# Patient Record
Sex: Female | Born: 1975 | Race: White | Hispanic: No | Smoking: Former smoker
Health system: Southern US, Community
[De-identification: ages and names within clinical notes are randomized; demographics above are authoritative.]

## PROBLEM LIST (undated history)

## (undated) DIAGNOSIS — K219 Gastro-esophageal reflux disease without esophagitis: Secondary | ICD-10-CM

## (undated) DIAGNOSIS — G35 Multiple sclerosis: Secondary | ICD-10-CM

## (undated) DIAGNOSIS — G468 Other vascular syndromes of brain in cerebrovascular diseases: Secondary | ICD-10-CM

## (undated) DIAGNOSIS — F329 Major depressive disorder, single episode, unspecified: Secondary | ICD-10-CM

## (undated) DIAGNOSIS — K859 Acute pancreatitis without necrosis or infection, unspecified: Secondary | ICD-10-CM

## (undated) DIAGNOSIS — E2740 Unspecified adrenocortical insufficiency: Secondary | ICD-10-CM

## (undated) DIAGNOSIS — K469 Unspecified abdominal hernia without obstruction or gangrene: Secondary | ICD-10-CM

## (undated) DIAGNOSIS — K862 Cyst of pancreas: Secondary | ICD-10-CM

## (undated) DIAGNOSIS — M329 Systemic lupus erythematosus, unspecified: Secondary | ICD-10-CM

## (undated) DIAGNOSIS — K439 Ventral hernia without obstruction or gangrene: Secondary | ICD-10-CM

## (undated) DIAGNOSIS — IMO0002 Reserved for concepts with insufficient information to code with codable children: Secondary | ICD-10-CM

## (undated) DIAGNOSIS — F32A Depression, unspecified: Secondary | ICD-10-CM

## (undated) DIAGNOSIS — F4310 Post-traumatic stress disorder, unspecified: Secondary | ICD-10-CM

## (undated) DIAGNOSIS — F419 Anxiety disorder, unspecified: Secondary | ICD-10-CM

## (undated) HISTORY — DX: Other vascular syndromes of brain in cerebrovascular diseases: G46.8

## (undated) HISTORY — DX: Systemic lupus erythematosus, unspecified: M32.9

## (undated) HISTORY — PX: ROUX-EN-Y GASTRIC BYPASS: SHX1104

## (undated) HISTORY — PX: ADENOIDECTOMY: SUR15

## (undated) HISTORY — DX: Ventral hernia without obstruction or gangrene: K43.9

## (undated) HISTORY — DX: Post-traumatic stress disorder, unspecified: F43.10

## (undated) HISTORY — PX: ABDOMINAL SURGERY: SHX537

## (undated) HISTORY — PX: CHOLECYSTECTOMY: SHX55

## (undated) HISTORY — PX: ROUX-EN-Y PROCEDURE: SUR1287

## (undated) HISTORY — DX: Reserved for concepts with insufficient information to code with codable children: IMO0002

---

## 2001-02-13 ENCOUNTER — Emergency Department (HOSPITAL_COMMUNITY): Admission: EM | Admit: 2001-02-13 | Discharge: 2001-02-13 | Payer: Self-pay | Admitting: Emergency Medicine

## 2001-02-17 ENCOUNTER — Encounter: Admission: RE | Admit: 2001-02-17 | Discharge: 2001-02-17 | Payer: Self-pay | Admitting: Internal Medicine

## 2001-02-22 ENCOUNTER — Encounter: Payer: Self-pay | Admitting: Internal Medicine

## 2001-02-22 ENCOUNTER — Inpatient Hospital Stay (HOSPITAL_COMMUNITY): Admission: EM | Admit: 2001-02-22 | Discharge: 2001-02-24 | Payer: Self-pay | Admitting: Emergency Medicine

## 2001-02-23 ENCOUNTER — Encounter: Payer: Self-pay | Admitting: Internal Medicine

## 2013-03-15 ENCOUNTER — Emergency Department (HOSPITAL_BASED_OUTPATIENT_CLINIC_OR_DEPARTMENT_OTHER): Payer: Self-pay

## 2013-03-15 ENCOUNTER — Encounter (HOSPITAL_BASED_OUTPATIENT_CLINIC_OR_DEPARTMENT_OTHER): Payer: Self-pay | Admitting: Emergency Medicine

## 2013-03-15 ENCOUNTER — Inpatient Hospital Stay (HOSPITAL_BASED_OUTPATIENT_CLINIC_OR_DEPARTMENT_OTHER)
Admission: EM | Admit: 2013-03-15 | Discharge: 2013-03-23 | DRG: 385 | Disposition: A | Discharge status: Home / Self Care | Payer: Self-pay | Attending: Internal Medicine | Admitting: Internal Medicine

## 2013-03-15 DIAGNOSIS — K859 Acute pancreatitis without necrosis or infection, unspecified: Secondary | ICD-10-CM | POA: Insufficient documentation

## 2013-03-15 DIAGNOSIS — R933 Abnormal findings on diagnostic imaging of other parts of digestive tract: Secondary | ICD-10-CM

## 2013-03-15 DIAGNOSIS — A09 Infectious gastroenteritis and colitis, unspecified: Secondary | ICD-10-CM

## 2013-03-15 DIAGNOSIS — K863 Pseudocyst of pancreas: Secondary | ICD-10-CM | POA: Insufficient documentation

## 2013-03-15 DIAGNOSIS — E876 Hypokalemia: Secondary | ICD-10-CM | POA: Diagnosis present

## 2013-03-15 DIAGNOSIS — K862 Cyst of pancreas: Secondary | ICD-10-CM | POA: Insufficient documentation

## 2013-03-15 DIAGNOSIS — F17200 Nicotine dependence, unspecified, uncomplicated: Secondary | ICD-10-CM | POA: Diagnosis present

## 2013-03-15 DIAGNOSIS — E871 Hypo-osmolality and hyponatremia: Secondary | ICD-10-CM | POA: Diagnosis present

## 2013-03-15 DIAGNOSIS — Z6841 Body Mass Index (BMI) 40.0 and over, adult: Secondary | ICD-10-CM

## 2013-03-15 DIAGNOSIS — K861 Other chronic pancreatitis: Secondary | ICD-10-CM | POA: Diagnosis present

## 2013-03-15 DIAGNOSIS — K8689 Other specified diseases of pancreas: Secondary | ICD-10-CM

## 2013-03-15 DIAGNOSIS — K529 Noninfective gastroenteritis and colitis, unspecified: Secondary | ICD-10-CM

## 2013-03-15 DIAGNOSIS — K5000 Crohn's disease of small intestine without complications: Principal | ICD-10-CM | POA: Diagnosis present

## 2013-03-15 DIAGNOSIS — Z9089 Acquired absence of other organs: Secondary | ICD-10-CM

## 2013-03-15 DIAGNOSIS — K5289 Other specified noninfective gastroenteritis and colitis: Secondary | ICD-10-CM

## 2013-03-15 DIAGNOSIS — K219 Gastro-esophageal reflux disease without esophagitis: Secondary | ICD-10-CM | POA: Diagnosis present

## 2013-03-15 DIAGNOSIS — Z880 Allergy status to penicillin: Secondary | ICD-10-CM

## 2013-03-15 DIAGNOSIS — E668 Other obesity: Secondary | ICD-10-CM | POA: Insufficient documentation

## 2013-03-15 DIAGNOSIS — F1120 Opioid dependence, uncomplicated: Secondary | ICD-10-CM | POA: Diagnosis present

## 2013-03-15 DIAGNOSIS — N390 Urinary tract infection, site not specified: Secondary | ICD-10-CM

## 2013-03-15 HISTORY — DX: Gastro-esophageal reflux disease without esophagitis: K21.9

## 2013-03-15 HISTORY — DX: Unspecified abdominal hernia without obstruction or gangrene: K46.9

## 2013-03-15 HISTORY — DX: Cyst of pancreas: K86.2

## 2013-03-15 HISTORY — DX: Acute pancreatitis without necrosis or infection, unspecified: K85.90

## 2013-03-15 LAB — CBC WITH DIFFERENTIAL/PLATELET
Basophils Absolute: 0 10*3/uL (ref 0.0–0.1)
Basophils Relative: 0 % (ref 0–1)
Eosinophils Absolute: 0 10*3/uL (ref 0.0–0.7)
Eosinophils Relative: 0 % (ref 0–5)
HCT: 37.5 % (ref 36.0–46.0)
Hemoglobin: 12.9 g/dL (ref 12.0–15.0)
Lymphocytes Relative: 14 % (ref 12–46)
Lymphs Abs: 1.9 10*3/uL (ref 0.7–4.0)
MCH: 29.3 pg (ref 26.0–34.0)
MCHC: 34.4 g/dL (ref 30.0–36.0)
MCV: 85 fL (ref 78.0–100.0)
Monocytes Absolute: 1.5 10*3/uL — ABNORMAL HIGH (ref 0.1–1.0)
Monocytes Relative: 11 % (ref 3–12)
Neutro Abs: 10.3 10*3/uL — ABNORMAL HIGH (ref 1.7–7.7)
Neutrophils Relative %: 75 % (ref 43–77)
Platelets: 224 10*3/uL (ref 150–400)
RBC: 4.41 MIL/uL (ref 3.87–5.11)
RDW: 13.4 % (ref 11.5–15.5)
WBC: 13.7 10*3/uL — ABNORMAL HIGH (ref 4.0–10.5)

## 2013-03-15 LAB — COMPREHENSIVE METABOLIC PANEL
CO2: 22 mEq/L (ref 19–32)
Calcium: 8.8 mg/dL (ref 8.4–10.5)
Creatinine, Ser: 0.7 mg/dL (ref 0.50–1.10)
GFR calc Af Amer: >90 mL/min (ref >90)
GFR calc non Af Amer: >90 mL/min (ref >90)
Glucose, Bld: 124 mg/dL — ABNORMAL HIGH (ref 70–99)
Total Protein: 7.8 g/dL (ref 6.0–8.3)

## 2013-03-15 LAB — CBC
Hemoglobin: 11 g/dL — ABNORMAL LOW (ref 12.0–15.0)
MCH: 28.6 pg (ref 26.0–34.0)
Platelets: 225 10*3/uL (ref 150–400)
RBC: 3.84 MIL/uL — ABNORMAL LOW (ref 3.87–5.11)
WBC: 9 10*3/uL (ref 4.0–10.5)

## 2013-03-15 LAB — LIPASE, BLOOD: Lipase: 11 U/L (ref 11–59)

## 2013-03-15 LAB — MAGNESIUM: Magnesium: 2.2 mg/dL (ref 1.5–2.5)

## 2013-03-15 LAB — URINALYSIS, ROUTINE W REFLEX MICROSCOPIC
Glucose, UA: NEGATIVE mg/dL
Ketones, ur: 40 mg/dL — AB
Nitrite: NEGATIVE
Protein, ur: 100 mg/dL — AB
Specific Gravity, Urine: 1.023 (ref 1.005–1.030)
Urobilinogen, UA: 1 mg/dL (ref 0.0–1.0)
pH: 5.5 (ref 5.0–8.0)

## 2013-03-15 LAB — CREATININE, SERUM
Creatinine, Ser: 0.74 mg/dL (ref 0.50–1.10)
GFR calc Af Amer: >90 mL/min (ref >90)

## 2013-03-15 LAB — PREGNANCY, URINE: Preg Test, Ur: NEGATIVE

## 2013-03-15 LAB — URINE MICROSCOPIC-ADD ON

## 2013-03-15 MED ORDER — FENTANYL CITRATE 0.05 MG/ML IJ SOLN
25.0000 ug | Freq: Once | INTRAMUSCULAR | Status: AC
  Administered 2013-03-15: 25 ug via INTRAVENOUS
  Filled 2013-03-15: qty 2

## 2013-03-15 MED ORDER — KETOROLAC TROMETHAMINE 30 MG/ML IJ SOLN
30.0000 mg | Freq: Once | INTRAMUSCULAR | Status: AC
  Administered 2013-03-15: 30 mg via INTRAVENOUS
  Filled 2013-03-15: qty 1

## 2013-03-15 MED ORDER — CIPROFLOXACIN IN D5W 400 MG/200ML IV SOLN
400.0000 mg | Freq: Once | INTRAVENOUS | Status: AC
  Administered 2013-03-15: 400 mg via INTRAVENOUS
  Filled 2013-03-15: qty 200

## 2013-03-15 MED ORDER — METRONIDAZOLE IN NACL 5-0.79 MG/ML-% IV SOLN
500.0000 mg | Freq: Three times a day (TID) | INTRAVENOUS | Status: DC
  Administered 2013-03-15 – 2013-03-21 (×18): 500 mg via INTRAVENOUS
  Filled 2013-03-15 (×22): qty 100

## 2013-03-15 MED ORDER — PANTOPRAZOLE SODIUM 40 MG PO TBEC: 40.0000 mg | DELAYED_RELEASE_TABLET | Freq: Every day | ORAL | Status: DC

## 2013-03-15 MED ORDER — FENTANYL CITRATE 0.05 MG/ML IJ SOLN
50.0000 ug | Freq: Once | INTRAMUSCULAR | Status: AC
Start: 2013-03-15 — End: 2013-03-15
  Administered 2013-03-15: 08:00:00 via INTRAVENOUS
  Filled 2013-03-15: qty 2

## 2013-03-15 MED ORDER — POTASSIUM CHLORIDE 10 MEQ/100ML IV SOLN
10.0000 meq | INTRAVENOUS | Status: AC
  Administered 2013-03-15 (×3): 10 meq via INTRAVENOUS
  Filled 2013-03-15 (×3): qty 100

## 2013-03-15 MED ORDER — POTASSIUM CHLORIDE 10 MEQ/100ML IV SOLN
10.0000 meq | Freq: Once | INTRAVENOUS | Status: AC
  Administered 2013-03-15: 10 meq via INTRAVENOUS
  Filled 2013-03-15: qty 100

## 2013-03-15 MED ORDER — ACETAMINOPHEN 325 MG PO TABS: 650.0000 mg | ORAL_TABLET | Freq: Four times a day (QID) | ORAL | Status: DC | PRN

## 2013-03-15 MED ORDER — HEPARIN SODIUM (PORCINE) 5000 UNIT/ML IJ SOLN
5000.0000 [IU] | Freq: Three times a day (TID) | INTRAMUSCULAR | Status: DC
  Filled 2013-03-15 (×27): qty 1

## 2013-03-15 MED ORDER — DIPHENHYDRAMINE HCL 25 MG PO CAPS
50.0000 mg | ORAL_CAPSULE | Freq: Four times a day (QID) | ORAL | Status: DC | PRN
  Filled 2013-03-15: qty 2

## 2013-03-15 MED ORDER — FENTANYL CITRATE 0.05 MG/ML IJ SOLN: 50.0000 ug | Freq: Once | INTRAMUSCULAR | Status: DC

## 2013-03-15 MED ORDER — SODIUM CHLORIDE 0.9 % IV BOLUS (SEPSIS)
1000.0000 mL | Freq: Once | INTRAVENOUS | Status: AC
  Administered 2013-03-15: 1000 mL via INTRAVENOUS

## 2013-03-15 MED ORDER — ONDANSETRON HCL 4 MG PO TABS: 4.0000 mg | ORAL_TABLET | Freq: Four times a day (QID) | ORAL | Status: DC | PRN

## 2013-03-15 MED ORDER — FENTANYL CITRATE 0.05 MG/ML IJ SOLN
50.0000 ug | Freq: Once | INTRAMUSCULAR | Status: AC
  Administered 2013-03-15: 50 ug via INTRAVENOUS
  Filled 2013-03-15: qty 2

## 2013-03-15 MED ORDER — IOHEXOL 300 MG/ML  SOLN
100.0000 mL | Freq: Once | INTRAMUSCULAR | Status: AC | PRN
  Administered 2013-03-15: 100 mL via INTRAVENOUS

## 2013-03-15 MED ORDER — DIPHENHYDRAMINE HCL 50 MG/ML IJ SOLN
12.5000 mg | Freq: Once | INTRAMUSCULAR | Status: AC
  Administered 2013-03-15: 12.5 mg via INTRAVENOUS
  Filled 2013-03-15: qty 1

## 2013-03-15 MED ORDER — DIPHENHYDRAMINE HCL 50 MG/ML IJ SOLN
50.0000 mg | Freq: Four times a day (QID) | INTRAMUSCULAR | Status: DC | PRN
  Administered 2013-03-15 – 2013-03-21 (×21): 50 mg via INTRAVENOUS
  Filled 2013-03-15 (×24): qty 1

## 2013-03-15 MED ORDER — ACETAMINOPHEN 325 MG PO TABS: 650.0000 mg | ORAL_TABLET | Freq: Once | ORAL | Status: AC

## 2013-03-15 MED ORDER — ONDANSETRON HCL 4 MG/2ML IJ SOLN
4.0000 mg | Freq: Once | INTRAMUSCULAR | Status: AC
  Administered 2013-03-15: 4 mg via INTRAVENOUS
  Filled 2013-03-15: qty 2

## 2013-03-15 MED ORDER — FENTANYL CITRATE 0.05 MG/ML IJ SOLN
50.0000 ug | Freq: Once | INTRAMUSCULAR | Status: AC
  Administered 2013-03-15: 04:00:00 via INTRAVENOUS
  Filled 2013-03-15: qty 2

## 2013-03-15 MED ORDER — HYDROMORPHONE HCL PF 1 MG/ML IJ SOLN
1.0000 mg | INTRAMUSCULAR | Status: DC | PRN
  Administered 2013-03-15 – 2013-03-17 (×17): 1 mg via INTRAVENOUS
  Filled 2013-03-15 (×18): qty 1

## 2013-03-15 MED ORDER — DIPHENHYDRAMINE HCL 50 MG/ML IJ SOLN: 12.5000 mg | Freq: Once | INTRAMUSCULAR | Status: DC

## 2013-03-15 MED ORDER — POTASSIUM CHLORIDE IN NACL 40-0.9 MEQ/L-% IV SOLN
INTRAVENOUS | Status: DC
  Administered 2013-03-15 – 2013-03-18 (×8): via INTRAVENOUS
  Filled 2013-03-15 (×12): qty 1000

## 2013-03-15 MED ORDER — CIPROFLOXACIN IN D5W 400 MG/200ML IV SOLN
400.0000 mg | Freq: Two times a day (BID) | INTRAVENOUS | Status: DC
  Administered 2013-03-15 – 2013-03-21 (×12): 400 mg via INTRAVENOUS
  Filled 2013-03-15 (×14): qty 200

## 2013-03-15 MED ORDER — SODIUM CHLORIDE 0.9 % IV BOLUS (SEPSIS)
500.0000 mL | Freq: Once | INTRAVENOUS | Status: AC
  Administered 2013-03-15: 500 mL via INTRAVENOUS

## 2013-03-15 MED ORDER — METRONIDAZOLE IN NACL 5-0.79 MG/ML-% IV SOLN
500.0000 mg | Freq: Once | INTRAVENOUS | Status: AC
  Administered 2013-03-15: 500 mg via INTRAVENOUS
  Filled 2013-03-15: qty 100

## 2013-03-15 MED ORDER — FENTANYL CITRATE 0.05 MG/ML IJ SOLN
150.0000 ug | Freq: Once | INTRAMUSCULAR | Status: AC
  Administered 2013-03-15: 150 ug via INTRAVENOUS
  Filled 2013-03-15: qty 2

## 2013-03-15 MED ORDER — ONDANSETRON HCL 4 MG/2ML IJ SOLN
4.0000 mg | Freq: Four times a day (QID) | INTRAMUSCULAR | Status: DC | PRN
  Administered 2013-03-15 – 2013-03-19 (×6): 4 mg via INTRAVENOUS
  Filled 2013-03-15 (×13): qty 2

## 2013-03-15 MED ORDER — IOHEXOL 300 MG/ML  SOLN
50.0000 mL | Freq: Once | INTRAMUSCULAR | Status: AC | PRN
  Administered 2013-03-15: 50 mL via ORAL

## 2013-03-15 MED ORDER — ACETAMINOPHEN 325 MG PO TABS
ORAL_TABLET | ORAL | Status: AC
  Administered 2013-03-15: 650 mg via ORAL
  Filled 2013-03-15: qty 2

## 2013-03-15 MED ORDER — ACETAMINOPHEN 650 MG RE SUPP: 650.0000 mg | Freq: Four times a day (QID) | RECTAL | Status: DC | PRN

## 2013-03-15 MED ORDER — PANTOPRAZOLE SODIUM 40 MG IV SOLR
40.0000 mg | INTRAVENOUS | Status: DC
  Administered 2013-03-15 – 2013-03-17 (×3): 40 mg via INTRAVENOUS
  Filled 2013-03-15 (×4): qty 40

## 2013-03-15 NOTE — H&P (Signed)
[facility] History and Physical  Caro Brundidge Fangman VHQ:[id number] DOB: Oct 23, 1975 DOA: 03/15/2013  Referring physician: Nicanor Alcon PCP: No primary provider on file.   Chief Complaint: Abdominal pain  HPI: Mary [name] is a 37 y.o. female who presents to the emergency room with severe abdominal pain. She recently moved back to [place] from [place] and began having pain on the plane. She's had vomiting and diarrhea on and off for several days. Subjective fevers and chills. She has a history of recurrent idiopathic pancreatitis and thought this might be a flare. She has a history of a chronic pancreatic cyst and apparently had a procedure done at [facility] "that connects the intestine to the cyst" no records are available. She has had no sick contacts. She is a no suspect foods. Other than [place], in the [employer] area, she has had no travel. In the emergency room, her workup was significant for a CAT scan that showed the known pancreatic cyst, also, terminal ileitis. Patient has no previous history of ileitis or colitis that she knows of. No history of inflammatory bowel disease. She denies alcohol use whatsoever. She takes no chronic pain medications. She has received multiple doses of fentanyl, and still is having quite a bit of pain.  Review of Systems: Reviewed and as above otherwise negative.  Past Medical History  Diagnosis Date  . Hernia    several other unknown abdominal surgeries  Past Surgical History  Procedure Laterality Date  . Abdominal surgery    . Abdominal surgery      reun y surgery  . Cholecystectomy     Social History: She smokes about one cigarette a day. Denies alcohol or drug use. Currently unemployed. Recently broke up with her significant other, which is why she is moving back to [facility]   Allergies  Allergen Reactions  . Penicillins Anaphylaxis  . Latex Itching  . Morphine And Related Nausea And Vomiting   Family history: Mother and  father have diabetes, hypertension hyperlipidemia. On has collagenous colitis  Prior to Admission medications   Not on File   Physical Exam: Filed Vitals:   03/15/13 0350 03/15/13 0637 03/15/13 0802 03/15/13 0932  BP:  90/60 91/62 89/47   Pulse: 124 94 72 83  Temp: 100.7 F (38.2 C)  97.8 F (36.6 C) 97.4 F (36.3 C)  TempSrc: Oral   Oral  Resp: 20  18 18   Height:    5\' 4"  (1.626 m)  Weight:    112 kg (246 lb 14.6 oz)  SpO2: 98% 98% 96% 97%   BP 99/62  Pulse 73  Temp(Src) 97.5 F (36.4 C) (Oral)  Resp 18  Ht 5\' 4"  (1.626 m)  Wt 112 kg (246 lb 14.6 oz)  BMI 42.36 kg/m2  SpO2 98%  LMP 02/13/2013  General Appearance:    Alert, cooperative, no distress, appears stated age. obese  Head:    Normocephalic, without obvious abnormality, atraumatic  Eyes:    PERRL, conjunctiva/corneas clear, EOM's intact, fundi    benign, both eyes     Nose:   Nares normal, septum midline, mucosa normal, no drainage    or sinus tenderness  Throat:   dry mucous membranes   Neck:   Supple, symmetrical, trachea midline, no adenopathy;    thyroid:  no enlargement/tenderness/nodules; no carotid   bruit or JVD  Back:     Symmetric, no curvature, ROM normal, no CVA tenderness  Lungs:     Clear to auscultation bilaterally, respirations unlabored  Chest Wall:    No tenderness or deformity   Heart:    Regular rate and rhythm, S1 and S2 normal, no murmur, rub   or gallop     Abdomen:    obese. Soft. Mild right-sided abdominal tenderness. Normal bowel sounds   Genitalia:   deferred   Rectal:   deferred   Extremities:   Extremities normal, atraumatic, no cyanosis or edema  Pulses:   2+ and symmetric all extremities  Skin:   Skin color, texture, turgor normal, no rashes or lesions.multiple tattoos   Lymph nodes:   Cervical, supraclavicular, and axillary nodes normal  Neurologic:   CNII-XII intact, normal strength, sensation and reflexes    throughout    psychiatric: Normal affect. Calm and  cooperative  Labs on Admission:  Basic Metabolic Panel:  Recent Labs Lab 03/15/13 0410  NA 132*  K 3.1*  CL 95*  CO2 22  GLUCOSE 124*  BUN 9  CREATININE 0.70  CALCIUM 8.8   Liver Function Tests:  Recent Labs Lab 03/15/13 0410  AST 22  ALT 22  ALKPHOS 70  BILITOT 0.4  PROT 7.8  ALBUMIN 3.3*    Recent Labs Lab 03/15/13 0410  LIPASE 11   No results found for this basename: AMMONIA,  in the last 168 hours CBC:  Recent Labs Lab 03/15/13 0410  WBC 13.7*  NEUTROABS 10.3*  HGB 12.9  HCT 37.5  MCV 85.0  PLT 224   Cardiac Enzymes: No results found for this basename: CKTOTAL, CKMB, CKMBINDEX, TROPONINI,  in the last 168 hours  BNP (last 3 results) No results found for this basename: PROBNP,  in the last 8760 hours CBG: No results found for this basename: GLUCAP,  in the last 168 hours  Radiological Exams on Admission: Ct Abdomen Pelvis W Contrast  03/15/2013   *RADIOLOGY REPORT*  Clinical Data: Nausea, diarrhea, and fever.  CT ABDOMEN AND PELVIS WITH CONTRAST  Technique:  Multidetector CT imaging of the abdomen and pelvis was performed following the standard protocol during bolus administration of intravenous contrast.  Contrast:  100 ml Omnipaque-300.  Comparison: No previous imaging available for comparison. Abdominal MRI report 02/23/2001.  Findings:  BODY WALL: There are three upper abdominal midline herniations containing fat and omental vessel.  LOWER CHEST:  Mediastinum:  9 mm right lower.  Esophageal lymph node. Neighboring esophagus is not dilated or thickened.  Lungs/pleura: No consolidation.  ABDOMEN/PELVIS:  Liver: Diffuse low attenuation.  No focal abnormality.  Biliary: Cholecystectomy.  No biliary dilatation.  Pancreas: There is a septated cystic mass arising from the pancreatic body, with three macroscopic cysts.  In aggregate, the lesion measures 4.4 x 3.85 x 3 cm.  No enhancing nodule identified. No evidence of acute or previous pancreatitis. No main  duct dilatation. No pancreatic dilatation.  Spleen: Unremarkable.  Adrenals: 17 mm nodule in the left adrenal body.  Kidneys and ureters: No hydronephrosis or stone.  Bladder: Unremarkable.  Bowel: No obstruction. There is thickening of the terminal ileal wall circumferentially by submucosal edema.  No other segmental of bowel inflammation identified.  There has been previous small bowel surgery with anastomosis in the left abdomen. No evidence of creeping fat or fistulization. Normal appendix.  Retroperitoneum: Ileocolic lymphadenopathy, presumed reactive to the above. Enlargement of left external iliac chain lymph node, 20 x 10 mm on image 82.  In the absence of additional unexplained intra-abdominal adenopathy, this is presumably reactive.  Peritoneum: No free fluid or gas.  Reproductive: Unremarkable.  Vascular: No acute abnormality.  OSSEOUS: No acute abnormalities. No suspicious lytic or blastic lesions.  IMPRESSION:  1.  Terminal ileitis which could be infectious or inflammatory.  No obstruction. 2.  4.3 cm macrocystic pancreatic mass, present since at least 2002. 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 3.  17 mm left adrenal nodule, likely adenoma.  4. Three ventral abdominal wall hernias containing omentum.   Original Report Authenticated By: Tiburcio Pea   Assessment/Plan Active Problems:   Terminal ileitis:  Could be infectious etiology. Need records from [facility]. Also has been hospitalize at [facility] in [place], [place]. Will get records. Empiric cipro and flagyl. Discussed with Craig Guess, GI PA. She recommends getting records, trying empiric abx. GI consult if no improvement to consider colonoscopy to evaluate for IBD. Will order stool cx. Clears. IVF, antiemetics and pain medicine   Obesity, morbid   Pancreatic cyst, chronic   history of recurrent ideopathic pancreatitis Hypokalemia: replete  Code Status:  full Family Communication: father and sister at bedside Disposition Plan: home Time spent: 60 minutes  Christiane Ha [facility] Pager [phone]  If 7PM-7AM, please contact night-coverage [email or web address] Password [email or web address] 03/15/2013, 10:50 AM

## 2013-03-15 NOTE — ED Notes (Signed)
Pt returned from ct, family at bedside, no complaints at this time

## 2013-03-15 NOTE — Progress Notes (Signed)
[facility] transfer PCP: unassigned 36/F, moving to [place] from [place] presented to ER with Abd pain CT with ileitis and 4.3pancreatic mass, low grade temp and mild tachycardia, improved Accepted to Med-Surg, Team 10 at [facility], [place] [phone]

## 2013-03-15 NOTE — ED Provider Notes (Addendum)
Per Dr. Jomarie Longs admit to inpatient do not place in OBS  Piper Hassebrock Smitty Cords, MD 03/15/13 1610  Jasmine Awe, MD 03/15/13 [phone]

## 2013-03-15 NOTE — Progress Notes (Signed)
Mary [name] [id number] Admission Data: 03/15/2013 8:08 PM Attending Provider: Christiane Ha, MD  PCP:No primary provider on file. Consults/ Treatment Team:    Mary [name] is a 37 y.o. female patient admitted from ED awake, alert  & orientated  X 3,  Full Code, VSS - Blood pressure 99/62, pulse 73, temperature 97.5 F (36.4 C), temperature source Oral, resp. rate 18, height 5\' 4"  (1.626 m), weight 112 kg (246 lb 14.6 oz), last menstrual period 02/13/2013, SpO2 98.00%., , no c/o shortness of breath, no c/o chest pain, no distress noted.    IV site WDL:  forearm left, condition patent and no redness with a transparent dsg that's clean dry and intact.  Allergies:   Allergies  Allergen Reactions  . Penicillins Anaphylaxis  . Latex Itching  . Morphine And Related Nausea And Vomiting     Past Medical History  Diagnosis Date  . Hernia   . GERD (gastroesophageal reflux disease)     History:  obtained from the patient. Tobacco/alcohol: denied none  Pt orientation to unit, room and routine. Information packet given to patient/family and safety video watched.  Admission INP armband ID verified with patient/family, and in place. SR up x 2, fall risk assessment complete with Patient and family verbalizing understanding of risks associated with falls. Pt verbalizes an understanding of how to use the call bell and to call for help before getting out of bed.  Skin, clean-dry- intact without evidence of bruising, or skin tears.   No evidence of skin break down noted on exam.     Will cont to monitor and assist as needed.  Cindra Eves, RN 03/15/2013 8:08 PM

## 2013-03-15 NOTE — ED Provider Notes (Signed)
CSN: [id number]     Arrival date & time 03/15/13  0326 History     First MD Initiated Contact with Patient 03/15/13 [phone]     Chief Complaint  Patient presents with  . Abdominal Pain   (Consider location/radiation/quality/duration/timing/severity/associated sxs/prior Treatment) Patient is a 37 y.o. female presenting with abdominal pain. The history is provided by the patient.  Abdominal Pain This is a recurrent problem. The current episode started 6 to 12 hours ago. The problem occurs constantly. The problem has not changed since onset.Associated symptoms include abdominal pain. Pertinent negatives include no chest pain, no headaches and no shortness of breath. Nothing aggravates the symptoms. Nothing relieves the symptoms. She has tried nothing for the symptoms. The treatment provided no relief.  States she has chronic pancreatitis and thinks she is having a flare because she has upper abdominal pain.  No urinary symptoms but has groin pain.  No trauma.  Just got off a pain that came from [place] and came right here.  Reports morphine causes her pressure to bottom out and states last time she was given it "they told her family she wasn't going to make it."  Past Medical History  Diagnosis Date  . Hernia    Past Surgical History  Procedure Laterality Date  . Abdominal surgery    . Abdominal surgery      reun y surgery  . Cholecystectomy     History reviewed. No pertinent family history. History  Substance Use Topics  . Smoking status: Current Every Day Smoker -- 0.50 packs/day    Types: Cigarettes  . Smokeless tobacco: Not on file  . Alcohol Use: No   OB History   Grav Para Term Preterm Abortions TAB SAB Ect Mult Living                 Review of Systems  Constitutional: Negative for fever.  Respiratory: Negative for shortness of breath.   Cardiovascular: Negative for chest pain.  Gastrointestinal: Positive for abdominal pain. Negative for nausea, vomiting and diarrhea.   Neurological: Negative for headaches.  All other systems reviewed and are negative.    Allergies  Latex; Morphine and related; and Penicillins  Home Medications   Current Outpatient Rx  Name  Route  Sig  Dispense  Refill  . ALPRAZolam (XANAX) 1 MG tablet   Oral   Take 1 mg by mouth at bedtime as needed for sleep.          Pulse 124  Temp(Src) 100.7 F (38.2 C) (Oral)  Resp 20  SpO2 98%  LMP 02/13/2013 Physical Exam  Constitutional: She is oriented to person, place, and time. She appears well-developed and well-nourished. No distress.  HENT:  Head: Normocephalic and atraumatic.  Mouth/Throat: Oropharynx is clear and moist.  Eyes: Conjunctivae are normal. Pupils are equal, round, and reactive to light.  Neck: Normal range of motion. Neck supple.  Cardiovascular: Normal rate, regular rhythm and intact distal pulses.   Pulmonary/Chest: Effort normal and breath sounds normal. She has no wheezes. She has no rales.  Abdominal: Soft. Bowel sounds are normal. There is no tenderness. There is no rebound and no guarding.  Musculoskeletal: Normal range of motion.  Neurological: She is alert and oriented to person, place, and time.  Skin: Skin is warm and dry.  Psychiatric: She has a normal mood and affect.    ED Course   Procedures (including critical care time)  Labs Reviewed  URINALYSIS, ROUTINE W REFLEX MICROSCOPIC - Abnormal; Notable for  the following:    Color, Urine AMBER (*)    APPearance CLOUDY (*)    Hgb urine dipstick SMALL (*)    Bilirubin Urine MODERATE (*)    Ketones, ur 40 (*)    Protein, ur 100 (*)    Leukocytes, UA SMALL (*)    All other components within normal limits  CBC WITH DIFFERENTIAL - Abnormal; Notable for the following:    WBC 13.7 (*)    Neutro Abs 10.3 (*)    Monocytes Absolute 1.5 (*)    All other components within normal limits  URINE MICROSCOPIC-ADD ON - Abnormal; Notable for the following:    Squamous Epithelial / LPF MANY (*)     Bacteria, UA MANY (*)    All other components within normal limits  URINE CULTURE  PREGNANCY, URINE  COMPREHENSIVE METABOLIC PANEL  LIPASE, BLOOD   No results found. No diagnosis found. Results for orders placed during the hospital encounter of 03/15/13  URINALYSIS, ROUTINE W REFLEX MICROSCOPIC      Result Value Range   Color, Urine AMBER (*) YELLOW   APPearance CLOUDY (*) CLEAR   Specific Gravity, Urine 1.023  1.005 - 1.030   pH 5.5  5.0 - 8.0   Glucose, UA NEGATIVE  NEGATIVE mg/dL   Hgb urine dipstick SMALL (*) NEGATIVE   Bilirubin Urine MODERATE (*) NEGATIVE   Ketones, ur 40 (*) NEGATIVE mg/dL   Protein, ur 213 (*) NEGATIVE mg/dL   Urobilinogen, UA 1.0  0.0 - 1.0 mg/dL   Nitrite NEGATIVE  NEGATIVE   Leukocytes, UA SMALL (*) NEGATIVE  CBC WITH DIFFERENTIAL      Result Value Range   WBC 13.7 (*) 4.0 - 10.5 K/uL   RBC 4.41  3.87 - 5.11 MIL/uL   Hemoglobin 12.9  12.0 - 15.0 g/dL   HCT 08.6  57.8 - 46.9 %   MCV 85.0  78.0 - 100.0 fL   MCH 29.3  26.0 - 34.0 pg   MCHC 34.4  30.0 - 36.0 g/dL   RDW 62.9  52.8 - 41.3 %   Platelets 224  150 - 400 K/uL   Neutrophils Relative % 75  43 - 77 %   Lymphocytes Relative 14  12 - 46 %   Monocytes Relative 11  3 - 12 %   Eosinophils Relative 0  0 - 5 %   Basophils Relative 0  0 - 1 %   Neutro Abs 10.3 (*) 1.7 - 7.7 K/uL   Lymphs Abs 1.9  0.7 - 4.0 K/uL   Monocytes Absolute 1.5 (*) 0.1 - 1.0 K/uL   Eosinophils Absolute 0.0  0.0 - 0.7 K/uL   Basophils Absolute 0.0  0.0 - 0.1 K/uL   WBC Morphology ATYPICAL LYMPHOCYTES    COMPREHENSIVE METABOLIC PANEL      Result Value Range   Sodium 132 (*) 135 - 145 mEq/L   Potassium 3.1 (*) 3.5 - 5.1 mEq/L   Chloride 95 (*) 96 - 112 mEq/L   CO2 22  19 - 32 mEq/L   Glucose, Bld 124 (*) 70 - 99 mg/dL   BUN 9  6 - 23 mg/dL   Creatinine, Ser 2.44  0.50 - 1.10 mg/dL   Calcium 8.8  8.4 - 01.0 mg/dL   Total Protein 7.8  6.0 - 8.3 g/dL   Albumin 3.3 (*) 3.5 - 5.2 g/dL   AST 22  0 - 37 U/L   ALT 22  0  - 35 U/L  Alkaline Phosphatase 70  39 - 117 U/L   Total Bilirubin 0.4  0.3 - 1.2 mg/dL   GFR calc non Af Amer >90  >90 mL/min   GFR calc Af Amer >90  >90 mL/min  LIPASE, BLOOD      Result Value Range   Lipase 11  11 - 59 U/L  PREGNANCY, URINE      Result Value Range   Preg Test, Ur NEGATIVE  NEGATIVE  URINE MICROSCOPIC-ADD ON      Result Value Range   Squamous Epithelial / LPF MANY (*) RARE   WBC, UA 3-6  <3 WBC/hpf   RBC / HPF 0-2  <3 RBC/hpf   Bacteria, UA MANY (*) RARE   Urine-Other MUCOUS PRESENT     Ct Abdomen Pelvis W Contrast  03/15/2013   *RADIOLOGY REPORT*  Clinical Data: Nausea, diarrhea, and fever.  CT ABDOMEN AND PELVIS WITH CONTRAST  Technique:  Multidetector CT imaging of the abdomen and pelvis was performed following the standard protocol during bolus administration of intravenous contrast.  Contrast:  100 ml Omnipaque-300.  Comparison: No previous imaging available for comparison. Abdominal MRI report 02/23/2001.  Findings:  BODY WALL: There are three upper abdominal midline herniations containing fat and omental vessel.  LOWER CHEST:  Mediastinum:  9 mm right lower.  Esophageal lymph node. Neighboring esophagus is not dilated or thickened.  Lungs/pleura: No consolidation.  ABDOMEN/PELVIS:  Liver: Diffuse low attenuation.  No focal abnormality.  Biliary: Cholecystectomy.  No biliary dilatation.  Pancreas: There is a septated cystic mass arising from the pancreatic body, with three macroscopic cysts.  In aggregate, the lesion measures 4.4 x 3.85 x 3 cm.  No enhancing nodule identified. No evidence of acute or previous pancreatitis. No main duct dilatation. No pancreatic dilatation.  Spleen: Unremarkable.  Adrenals: 17 mm nodule in the left adrenal body.  Kidneys and ureters: No hydronephrosis or stone.  Bladder: Unremarkable.  Bowel: No obstruction. There is thickening of the terminal ileal wall circumferentially by submucosal edema.  No other segmental of bowel inflammation  identified.  There has been previous small bowel surgery with anastomosis in the left abdomen. No evidence of creeping fat or fistulization. Normal appendix.  Retroperitoneum: Ileocolic lymphadenopathy, presumed reactive to the above. Enlargement of left external iliac chain lymph node, 20 x 10 mm on image 82.  In the absence of additional unexplained intra-abdominal adenopathy, this is presumably reactive.  Peritoneum: No free fluid or gas.  Reproductive: Unremarkable.  Vascular: No acute abnormality.  OSSEOUS: No acute abnormalities. No suspicious lytic or blastic lesions.  IMPRESSION:  1.  Terminal ileitis which could be infectious or inflammatory.  No obstruction. 2.  4.3 cm macrocystic pancreatic mass, present since at least 2002. 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 3.  17 mm left adrenal nodule, likely adenoma.  4. Three ventral abdominal wall hernias containing omentum.   Original Report Authenticated By: Tiburcio Pea  Medications  ciprofloxacin (CIPRO) IVPB 400 mg (not administered)  metroNIDAZOLE (FLAGYL) IVPB 500 mg (not administered)  fentaNYL (SUBLIMAZE) injection 50 mcg ( Intravenous Given 03/15/13 0417)  ondansetron (ZOFRAN) injection 4 mg (4 mg Intravenous Given 03/15/13 0420)  sodium chloride 0.9 % bolus 500 mL (0 mLs Intravenous Stopped 03/15/13 0540)  acetaminophen (TYLENOL) tablet 650 mg (650 mg Oral Given 03/15/13 0418)  fentaNYL (SUBLIMAZE) injection 150 mcg (150 mcg Intravenous Given 03/15/13 0504)  ketorolac (TORADOL) 30 MG/ML injection 30 mg (30 mg Intravenous Given 03/15/13  0503)  iohexol (OMNIPAQUE) 300 MG/ML solution 50 mL (50 mLs Oral Contrast Given 03/15/13 0552)  iohexol (OMNIPAQUE) 300 MG/ML solution 100 mL (100 mLs Intravenous Contrast Given 03/15/13 0552)    MDM  Due to the fact that morphine caused such a severe reaction will choose fentanyl as it is blood pressure neutral.  Will obtain records from  [place]  UTI/ first case ileitis.  Patient also has pancreatic mass will need inpatient work up, has no doctor in the area.    Jasmine Awe, MD 03/15/13 [phone]

## 2013-03-15 NOTE — Progress Notes (Signed)
Pt is c/o hand pain in left hand due to IV potassium. 2 bags hung but pt refused third bag of potassium.   Manaal Mandala Ronette Deter

## 2013-03-15 NOTE — ED Notes (Signed)
Pt recently moved here from [place], flew straight from [place] to [place] to [place], drove from [place] to [employer] today, also abdominal pain to center of abdomen

## 2013-03-16 LAB — CBC WITH DIFFERENTIAL/PLATELET
Basophils Absolute: 0 10*3/uL (ref 0.0–0.1)
Eosinophils Relative: 0 % (ref 0–5)
Lymphocytes Relative: 21 % (ref 12–46)
Lymphs Abs: 1.9 10*3/uL (ref 0.7–4.0)
MCV: 85.8 fL (ref 78.0–100.0)
Neutro Abs: 6.3 10*3/uL (ref 1.7–7.7)
Neutrophils Relative %: 68 % (ref 43–77)
Platelets: 224 10*3/uL (ref 150–400)
RBC: 3.67 MIL/uL — ABNORMAL LOW (ref 3.87–5.11)
RDW: 13.9 % (ref 11.5–15.5)
WBC: 9.1 10*3/uL (ref 4.0–10.5)

## 2013-03-16 LAB — BASIC METABOLIC PANEL
CO2: 19 mEq/L (ref 19–32)
Calcium: 7.9 mg/dL — ABNORMAL LOW (ref 8.4–10.5)
Creatinine, Ser: 0.69 mg/dL (ref 0.50–1.10)
GFR calc Af Amer: >90 mL/min (ref >90)
GFR calc non Af Amer: >90 mL/min (ref >90)
Sodium: 130 mEq/L — ABNORMAL LOW (ref 135–145)

## 2013-03-16 LAB — URINE CULTURE
Colony Count: NO GROWTH
Culture: NO GROWTH

## 2013-03-16 NOTE — Care Management Note (Signed)
    Page 1 of 2   03/23/2013     11:08:33 AM   CARE MANAGEMENT NOTE 03/23/2013  Patient:  Mary [name], Mary [name]   Account Number:  [id number]  Date Initiated:  03/16/2013  Documentation initiated by:  Letha Cape  Subjective/Objective Assessment:   dx ileitis  admit- lives with family.  She is from [place].     Action/Plan:   Anticipated DC Date:  03/23/2013   Anticipated DC Plan:  HOME/SELF CARE      DC Planning Services  CM consult  MATCH Program  Medication Assistance  Follow-up appt scheduled      Choice offered to / List presented to:             Status of service:  Completed, signed off Medicare Important Message given?   (If response is "NO", the following Medicare IM given date fields will be blank) Date Medicare IM given:   Date Additional Medicare IM given:    Discharge Disposition:  HOME/SELF CARE  Per UR Regulation:  Reviewed for med. necessity/level of care/duration of stay  If discussed at Long Length of Stay Meetings, dates discussed:   03/22/2013    Comments:  03/23/13 10:59 Letha Cape RN, BSN [phone] patient is for dc today, she has f/u appt with [facility] in which I explained upon apt she will need to pay $135.00 and also patient needs ast with medications, I assited her with the Match Program.  Informed patient that I could not get [facility] or [facility] because she has not been in [facility] for 3 months yet, but I gave her the paperwork to fill out for the orange card so when her 3 month period is up she can go ahead and take care of this. Patient has transportation. Called financial counselor, Gavin Pound,  to see if she could speak to patient about applying for medicaid.  03/22/13 16:22 Letha Cape RN, BSN [phone] patient slow to clinically improvement, on full liq diet, wean off iv pain meds to orals. Plan for dc on 8/6.  03/16/13 15:00 Letha Cape RN, BSN [phone] patient lives alone, pta indep.  Patient has no insurance, she states she will need ast with medications and a PCP.

## 2013-03-16 NOTE — Progress Notes (Signed)
PATIENT DETAILS Name: Mary [name] Age: 37 y.o. Sex: female Date of Birth: Apr 04, 1976 Admit Date: 03/15/2013 Admitting Physician Christiane Ha, MD PCP:No primary provider on file.  Subjective: Admitted with abdominal pain along with nausea vomiting and diarrhea. Prior history of pancreatitis. This morning, claims that her pain is better.  Assessment/Plan: Principal Problem:   Terminal ileitis - Continue with current supportive care and Cipro with Flagyl - Continue clear liquids, which slowly advance as tolerated - Await C. difficile PCR - Await medical records from [place]-if symptoms do not improve may need a formal GI consultation- however claims to have the current abdominal pain like this with associated nausea vomiting and diarrhea over the past 6 years. She claims she gets admitted to the hospital, get supportive care and subsequently gets discharged.  Active Problems: Hyponatremia - Suspect from dehydration-continue with IV fluids and monitor electrolytes  Chronic idiopathic pancreatitis/chronic pancreatic cyst - On CT of the abdomen-cyst on the pancreas unchanged from 2002 -  Await medical records from [place]  Hypokalemia - Repleted, monitor lites    Obesity, morbid - Counseled regarding importance of weight loss  Disposition: Remain inpatient  DVT Prophylaxis: Prophylacticr Heparin  Code Status: Full code   Family Communication None  Procedures:  None  CONSULTS:  None   MEDICATIONS: Scheduled Meds: . ciprofloxacin  400 mg Intravenous Q12H  . heparin  5,000 Units Subcutaneous Q8H  . metronidazole  500 mg Intravenous Q8H  . pantoprazole (PROTONIX) IV  40 mg Intravenous Q24H   Continuous Infusions: . 0.9 % NaCl with KCl 40 mEq / L 125 mL/hr at 03/16/13 0628   PRN Meds:.acetaminophen, acetaminophen, diphenhydrAMINE, HYDROmorphone (DILAUDID) injection, ondansetron (ZOFRAN) IV, ondansetron  Antibiotics: Anti-infectives   Start      Dose/Rate Route Frequency Ordered Stop   03/15/13 1800  ciprofloxacin (CIPRO) IVPB 400 mg     400 mg 200 mL/hr over 60 Minutes Intravenous Every 12 hours 03/15/13 1045     03/15/13 1600  metroNIDAZOLE (FLAGYL) IVPB 500 mg     500 mg 100 mL/hr over 60 Minutes Intravenous Every 8 hours 03/15/13 1045     03/15/13 0630  ciprofloxacin (CIPRO) IVPB 400 mg     400 mg 200 mL/hr over 60 Minutes Intravenous  Once 03/15/13 0630 03/15/13 0753   03/15/13 0630  metroNIDAZOLE (FLAGYL) IVPB 500 mg     500 mg 100 mL/hr over 60 Minutes Intravenous  Once 03/15/13 0630 03/15/13 0854       PHYSICAL EXAM: Vital signs in last 24 hours: Filed Vitals:   03/15/13 2213 03/16/13 0159 03/16/13 0512 03/16/13 1036  BP: 112/68 129/83 111/68 98/62  Pulse: 85 96 95 85  Temp: 99.5 F (37.5 C) 100.4 F (38 C) 99 F (37.2 C) 99.7 F (37.6 C)  TempSrc: Oral Oral Oral Oral  Resp: 18 20 20 18   Height:      Weight:      SpO2: 98% 99% 96% 94%    Weight change:  Filed Weights   03/15/13 0932  Weight: 112 kg (246 lb 14.6 oz)   Body mass index is 42.36 kg/(m^2).   Gen Exam: Awake and alert with clear speech.   Neck: Supple, No JVD.   Chest: B/L Clear.   CVS: S1 S2 Regular, no murmurs.  Abdomen: soft, BS +, tender in the epigastric/ Peri-umbilical area without rebound, non distended. Obese abdomen Extremities: no edema, lower extremities warm to touch. Neurologic: Non Focal.   Skin: No Rash.   Wounds:  N/A.    Intake/Output from previous day:  Intake/Output Summary (Last 24 hours) at 03/16/13 1241 Last data filed at 03/16/13 1041  Gross per 24 hour  Intake 3010.09 ml  Output   1830 ml  Net 1180.09 ml     LAB RESULTS: CBC  Recent Labs Lab 03/15/13 0410 03/15/13 1105 03/16/13 0645  WBC 13.7* 9.0 9.1  HGB 12.9 11.0* 10.6*  HCT 37.5 32.6* 31.5*  PLT 224 225 224  MCV 85.0 84.9 85.8  MCH 29.3 28.6 28.9  MCHC 34.4 33.7 33.7  RDW 13.4 13.5 13.9  LYMPHSABS 1.9  --  1.9  MONOABS 1.5*  --  1.0   EOSABS 0.0  --  0.0  BASOSABS 0.0  --  0.0    Chemistries   Recent Labs Lab 03/15/13 0410 03/15/13 1105 03/16/13 0645  NA 132*  --  130*  K 3.1*  --  4.3  CL 95*  --  101  CO2 22  --  19  GLUCOSE 124*  --  103*  BUN 9  --  6  CREATININE 0.70 0.74 0.69  CALCIUM 8.8  --  7.9*  MG  --  2.2  --     CBG: No results found for this basename: GLUCAP,  in the last 168 hours  GFR Estimated Creatinine Clearance: 120.2 ml/min (by C-G formula based on Cr of 0.69).  Coagulation profile No results found for this basename: INR, PROTIME,  in the last 168 hours  Cardiac Enzymes No results found for this basename: CK, CKMB, TROPONINI, MYOGLOBIN,  in the last 168 hours  No components found with this basename: POCBNP,  No results found for this basename: DDIMER,  in the last 72 hours No results found for this basename: HGBA1C,  in the last 72 hours No results found for this basename: CHOL, HDL, LDLCALC, TRIG, CHOLHDL, LDLDIRECT,  in the last 72 hours No results found for this basename: TSH, T4TOTAL, FREET3, T3FREE, THYROIDAB,  in the last 72 hours No results found for this basename: VITAMINB12, FOLATE, FERRITIN, TIBC, IRON, RETICCTPCT,  in the last 72 hours  Recent Labs  03/15/13 0410  LIPASE 11    Urine Studies No results found for this basename: UACOL, UAPR, USPG, UPH, UTP, UGL, UKET, UBIL, UHGB, UNIT, UROB, ULEU, UEPI, UWBC, URBC, UBAC, CAST, CRYS, UCOM, BILUA,  in the last 72 hours  MICROBIOLOGY: Recent Results (from the past 240 hour(s))  URINE CULTURE     Status: None   Collection Time    03/15/13  3:49 AM      Result Value Range Status   Specimen Description URINE, CLEAN CATCH   Final   Special Requests NONE   Final   Culture  Setup Time 03/15/2013 10:59   Final   Colony Count NO GROWTH   Final   Culture NO GROWTH   Final   Report Status 03/16/2013 FINAL   Final    RADIOLOGY STUDIES/RESULTS: Ct Abdomen Pelvis W Contrast  03/15/2013   *RADIOLOGY REPORT*  Clinical  Data: Nausea, diarrhea, and fever.  CT ABDOMEN AND PELVIS WITH CONTRAST  Technique:  Multidetector CT imaging of the abdomen and pelvis was performed following the standard protocol during bolus administration of intravenous contrast.  Contrast:  100 ml Omnipaque-300.  Comparison: No previous imaging available for comparison. Abdominal MRI report 02/23/2001.  Findings:  BODY WALL: There are three upper abdominal midline herniations containing fat and omental vessel.  LOWER CHEST:  Mediastinum:  9 mm right lower.  Esophageal lymph node. Neighboring esophagus is not dilated or thickened.  Lungs/pleura: No consolidation.  ABDOMEN/PELVIS:  Liver: Diffuse low attenuation.  No focal abnormality.  Biliary: Cholecystectomy.  No biliary dilatation.  Pancreas: There is a septated cystic mass arising from the pancreatic body, with three macroscopic cysts.  In aggregate, the lesion measures 4.4 x 3.85 x 3 cm.  No enhancing nodule identified. No evidence of acute or previous pancreatitis. No main duct dilatation. No pancreatic dilatation.  Spleen: Unremarkable.  Adrenals: 17 mm nodule in the left adrenal body.  Kidneys and ureters: No hydronephrosis or stone.  Bladder: Unremarkable.  Bowel: No obstruction. There is thickening of the terminal ileal wall circumferentially by submucosal edema.  No other segmental of bowel inflammation identified.  There has been previous small bowel surgery with anastomosis in the left abdomen. No evidence of creeping fat or fistulization. Normal appendix.  Retroperitoneum: Ileocolic lymphadenopathy, presumed reactive to the above. Enlargement of left external iliac chain lymph node, 20 x 10 mm on image 82.  In the absence of additional unexplained intra-abdominal adenopathy, this is presumably reactive.  Peritoneum: No free fluid or gas.  Reproductive: Unremarkable.  Vascular: No acute abnormality.  OSSEOUS: No acute abnormalities. No suspicious lytic or blastic lesions.  IMPRESSION:  1.  Terminal  ileitis which could be infectious or inflammatory.  No obstruction. 2.  4.3 cm macrocystic pancreatic mass, present since at least 2002. 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 3.  17 mm left adrenal nodule, likely adenoma.  4. Three ventral abdominal wall hernias containing omentum.   Original Report Authenticated By: Cynda Acres, MD  [facility] Pager:[phone]  If 7PM-7AM, please contact night-coverage [email or web address] Password [email or web address] 03/16/2013, 12:41 PM   LOS: 1 day

## 2013-03-17 ENCOUNTER — Encounter (HOSPITAL_COMMUNITY): Payer: Self-pay | Admitting: Physician Assistant

## 2013-03-17 DIAGNOSIS — A09 Infectious gastroenteritis and colitis, unspecified: Secondary | ICD-10-CM

## 2013-03-17 DIAGNOSIS — E871 Hypo-osmolality and hyponatremia: Secondary | ICD-10-CM

## 2013-03-17 DIAGNOSIS — R933 Abnormal findings on diagnostic imaging of other parts of digestive tract: Secondary | ICD-10-CM

## 2013-03-17 LAB — CBC
HCT: 31.8 % — ABNORMAL LOW (ref 36.0–46.0)
MCV: 86.4 fL (ref 78.0–100.0)
RBC: 3.68 MIL/uL — ABNORMAL LOW (ref 3.87–5.11)
RDW: 14 % (ref 11.5–15.5)
WBC: 7.7 10*3/uL (ref 4.0–10.5)

## 2013-03-17 LAB — BASIC METABOLIC PANEL
BUN: 5 mg/dL — ABNORMAL LOW (ref 6–23)
CO2: 20 mEq/L (ref 19–32)
Chloride: 102 mEq/L (ref 96–112)
GFR calc Af Amer: >90 mL/min (ref >90)
Potassium: 4.8 mEq/L (ref 3.5–5.1)

## 2013-03-17 LAB — GLUCOSE, CAPILLARY: Glucose-Capillary: 79 mg/dL (ref 70–99)

## 2013-03-17 MED ORDER — HYDROMORPHONE HCL PF 1 MG/ML IJ SOLN
1.0000 mg | INTRAMUSCULAR | Status: DC | PRN
  Administered 2013-03-17 – 2013-03-18 (×7): 1 mg via INTRAVENOUS
  Filled 2013-03-17 (×7): qty 1

## 2013-03-17 MED ORDER — NAPHAZOLINE HCL 0.1 % OP SOLN
1.0000 [drp] | Freq: Four times a day (QID) | OPHTHALMIC | Status: DC | PRN
  Administered 2013-03-17: 1 [drp] via OPHTHALMIC
  Filled 2013-03-17: qty 15

## 2013-03-17 NOTE — Consult Note (Signed)
[facility] Consult: 3:09 PM 03/17/2013   Referring Provider: Dr Ghimire Primary Care Physician:  None Primary Gastroenterologist:  none  Reason for Consultation:  Ileitis and pancreatic cystic mass.   HPI: Mary [name] is a 36 y.o. female.  Hx of recurrent pancreatitis and cystic pancreatic mass, ? Mucinous cyst neoplasm.  First episode was in 2002 and CT from then is cc'd below. Subsequently spent next decade and a [place] in [place]. Treated several times for the cyst and episodes of pancreatitis.  Interventions included attempt to place a "stent" to drain, this became infected within 24 hours and was then removed.  Soon after had a surgery where they hooked up her SB to the cyst. This all was in about 2008.  After that had about 2 other admissions with pancreas issues, but no further interventions.  Last was about 09/2011.  [place] onset of epigastric and LUQ pain with bilious emesis and watery, non-bloody diarrhea, chills, sweats. Came to ED on 7/29 and never had any BMs after that, so unable to get any stools studies.  Th nausea persists as does the pain.  These are indistinguishable from prior bouts of pancreatitis.   CT shows cystic masses in pancreatic body and terminal ileitis.  Lipase, LFTs normal. Na low to 130.  WBCs were 13.7.  Started on Cipro/Flagyl (now day 3) and WBCs now normal. Urine clx is negative.   Before 7/27 she had no GI issues.  She was eating well, did not have weight loss, no diarrhea, no heartburn, no dysphagia, no jaundice.   Her sxs persist.  Pt never drinks ETOH and never did. Smokes up to 10 cigs per day.  No occasional or regular meds.      Past Medical History  Diagnosis Date  . Hernia   . GERD (gastroesophageal reflux disease)     Past Surgical History  Procedure Laterality Date  . Abdominal surgery    . Abdominal surgery      reun y surgery  .  Cholecystectomy      Prior to Admission medications   Not on File    Scheduled Meds: . ciprofloxacin  400 mg Intravenous Q12H  . heparin  5,000 Units Subcutaneous Q8H  . metronidazole  500 mg Intravenous Q8H  . pantoprazole (PROTONIX) IV  40 mg Intravenous Q24H   Infusions: . 0.9 % NaCl with KCl 40 mEq / L 125 mL/hr at 03/17/13 1049   PRN Meds: acetaminophen, acetaminophen, diphenhydrAMINE, HYDROmorphone (DILAUDID) injection, ondansetron (ZOFRAN) IV, ondansetron   Allergies as of 03/15/2013 - Review Complete 03/15/2013  Allergen Reaction Noted  . Penicillins Anaphylaxis 03/15/2013  . Latex Itching 03/15/2013  . Morphine and related Nausea And Vomiting 03/15/2013     family history. No IBD, no pancreatitis.   History   Social History  . Marital Status: Single    Spouse Name: N/A    Number of Children: N/A  . Years of Education: N/A   Occupational History  . 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 Smoking status: Current Every Day Smoker -- 0.50 packs/day for 20 years    Types: Cigarettes  . Smokeless tobacco: Never Used  . Alcohol Use: No  . Drug Use: No  . Sexually Active: Not on file   Other Topics Concern  . Just broke up with her girlfriend of 14 years   Social   History Narrative  . No narrative on file    REVIEW OF SYSTEMS: Constitutional:  Stable weight ENT:  No nose bleeds,no congestion Pulm:  No SOB, no cough CV:  No CP , no palps, no extremity edema GU:  No dysuria, no hematuria Gyn:  Life long hx irregular menses, last was in April 2014 GI:  Per HPI Heme:  No hx low blood counts.    Transfusions:  none Neuro:  No headache, no hx seizures. Derm:  No rash, sores itching Tattoos:  All are professional starting age 21 to age 30 Endocrine:  No excessive thirst, no hx thyroid disease.  Never told she had polycystic ovary disease.  Immunization:  Not queried Travel:  Travelled from [place]  area of [place] 7/27   PHYSICAL EXAM: Vital signs in last 24 hours: Temp:  [98.2 F (36.8 C)-99.7 F (37.6 C)] 98.8 F (37.1 C) (07/31 1344) Pulse Rate:  [74-82] 74 (07/31 1344) Resp:  [18] 18 (07/31 1344) BP: (102-126)/(58-74) 108/63 mmHg (07/31 1344) SpO2:  [94 %-98 %] 96 % (07/31 1344)  General: obese wf with lip ring and shaved head.  Head:  No asymmetry, no swelling  Eyes:  No icterus or pallor Ears:  Not HOH  Nose:  No discharge or congestion Mouth:  Lip ring, moist and clear oral MM Neck:  No JVD, no goiter, no mass Lungs:  Clear bil  No SOB or cough Heart: RRR no MRG Abdomen:  Soft, active BS, ND, no mass or HSM.  Do not appreciate ventral hernia.  Tender in upper abdomen.  No guard or rebound. Large transverse surgical incision upper abdomen Rectal: scant stool is FOB negative and brown, mucoid.    Musc/Skeltl: no joint swelling, no joint deformity. Extremities:  No CCE.  Feet warm with good pedal pulses  Neurologic:  No tremor, no  Skin:  No rash or sores Tattoos:  Several on her arms. Nodes:  No cervical adenopathy.    Psych:  Pleasant, cooperative, not somnolent.   Intake/Output from previous day: 07/30 0701 - 07/31 0700 In: 1545.8 [I.V.:1545.8] Out: 1630 [Urine:1630] Intake/Output this shift:    LAB RESULTS:  Recent Labs  03/15/13 1105 03/16/13 0645 03/17/13 0615  WBC 9.0 9.1 7.7  HGB 11.0* 10.6* 10.7*  HCT 32.6* 31.5* 31.8*  PLT 225 224 280   BMET Lab Results  Component Value Date   NA 134* 03/17/2013   NA 130* 03/16/2013   NA 132* 03/15/2013   K 4.8 03/17/2013   K 4.3 03/16/2013   K 3.1* 03/15/2013   CL 102 03/17/2013   CL 101 03/16/2013   CL 95* 03/15/2013   CO2 20 03/17/2013   CO2 19 03/16/2013   CO2 22 03/15/2013   GLUCOSE 105* 03/17/2013   GLUCOSE 103* 03/16/2013   GLUCOSE 124* 03/15/2013   BUN 5* 03/17/2013   BUN 6 03/16/2013   BUN 9 03/15/2013   CREATININE 0.76 03/17/2013   CREATININE 0.69 03/16/2013   CREATININE 0.74 03/15/2013   CALCIUM  8.7 03/17/2013   CALCIUM 7.9* 03/16/2013   CALCIUM 8.8 03/15/2013   LFT  Recent Labs  03/15/13 0410  PROT 7.8  ALBUMIN 3.3*  AST 22  ALT 22  ALKPHOS 70  BILITOT 0.4    RADIOLOGY STUDIES: CT ABDOMEN AND PELVIS WITH CONTRAST 03/15/2013 Findings:  BODY WALL: There are three upper abdominal midline herniations  containing fat and omental vessel.  LOWER CHEST:  Mediastinum: 9 mm right lower. Esophageal lymph node.  Neighboring esophagus   is not dilated or thickened.  Lungs/pleura: No consolidation.  ABDOMEN/PELVIS:  Liver: Diffuse low attenuation. No focal abnormality.  Biliary: Cholecystectomy. No biliary dilatation.  Pancreas: There is a septated cystic mass arising from the  pancreatic body, with three macroscopic cysts. In aggregate, the  lesion measures 4.4 x 3.85 x 3 cm. No enhancing nodule identified.  No evidence of acute or previous pancreatitis. No main duct  dilatation. No pancreatic dilatation.  Spleen: Unremarkable.  Adrenals: 17 mm nodule in the left adrenal body.  Kidneys and ureters: No hydronephrosis or stone.  Bladder: Unremarkable.  Bowel: No obstruction. There is thickening of the terminal ileal  wall circumferentially by submucosal edema. No other segmental of  bowel inflammation identified. There has been previous small bowel  surgery with anastomosis in the left abdomen. No evidence of  creeping fat or fistulization. Normal appendix.  Retroperitoneum: Ileocolic lymphadenopathy, presumed reactive to  the above. Enlargement of left external iliac chain lymph node, 20  x 10 mm on image 82. In the absence of additional unexplained  intra-abdominal adenopathy, this is presumably reactive.  Peritoneum: No free fluid or gas.  Reproductive: Unremarkable.  Vascular: No acute abnormality.  OSSEOUS: No acute abnormalities. No suspicious lytic or blastic  lesions.  IMPRESSION:  1. Terminal ileitis which could be infectious or inflammatory. No  obstruction.  2.  4.3 cm macrocystic pancreatic mass, present since at least  2002. Favor low grade neoplasm (such as mucinous cystic pancreatic  tumor); surveillance imaging recommended.  3. 17 mm left adrenal nodule, likely adenoma.  4. Three ventral abdominal wall hernias containing omentum.  Original Report Authenticated By: Jonathan Watts  CT ABDOMEN AND PELVIS WITH CONTRAST 02/22/2001 FINDINGS THE LUNG BASES ARE CLEAR AND THERE IS NO PLEURAL EFFUSION. THE GALLBLADDER IS SURGICALLY ABSENT.  THERE IS NO BILIARY DILATATION. THE LIVER, SPLEEN, ADRENAL GLANDS, AND KIDNEYS ARE UNREMARKABLE.  PROXIMALLY WITHIN THE PANCREATIC TAIL, THERE IS A 3.7 X 2.5 CM OVAL COLLECTION THAT MEASURES JUST ABOVE WATER ATTENUATION. THE PANCREATIC TAIL IS PROMINENT. I DO NOT SEE ANY DILATATION OF THE PANCREATIC DUCT. THERE IS SOME SOFT TISSUE STRANDING IN THE FAT SURROUNDING THE PANCREATIC TAIL. NO OTHER INTRA-ABDOMINAL FLUID COLLECTIONS ARE PRESENT.  IMPRESSION  OVAL MASS WITHIN THE PANCREATIC TAIL MEASURING NEAR WATER ATTENUATION. 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 QUESTION PANCREATITIS OF THE PANCREATIC TAIL. CORRELATION WITH THE PATIENT'S PRIOR IMAGING STUDIES IS STRONGLY RECOMMENDED. IF THOSE PRIOR STUDIES CANNOT BE OBTAINED, IT MAY BE HELPFUL TO PERFORM A FOLLOW-UP MRI TO INCLUDE POST-CONTRAST IMAGES.   CT SCAN OF THE PELVIS:  THERE IS NO PELVIC FLUID COLLECTION, MASS, OR INFLAMMATORY PROCESS. THE OVARIES ARE SLIGHTLY  PROMINENT, BUT WITHIN NORMAL LIMITS FOR SIZE.  IMPRESSION:  NEGATIVE CT OF THE PELVIS.   ENDOSCOPIC STUDIES: Not known.  Nothing in Epic.  All scopes were in [place] at [facility]  IMPRESSION: *  Ileitis.  Ongoing abdominal pain and nausea but no diarrhea since admission.  sxs started 7/27.  ? Infectious vs Crohn's? *  Pancreatic cystic mass.  Hx of same and of idiopathic, recurrent pancreatitis dating  back to 2002 S/p some sort of cyst drainage involving the small intestine. This after complications from stent placement.  *  Ventral wall hernias *  Adrenal nodule.   PLAN: *  Per Dr Ina Scrivens.  *  ? Check tryglycerides to see if that may be source of previous bouts with pancreatitis.  *    MD has requested [facility] records, they told staff it takes up to 15 days to fax records!!!!!!  Dr Ghimire is going to insist that records be expidited.    LOS: 2 days   Sarah Gribbin  03/17/2013, 3:09 PM Pager: [phone]  GI ATTENDING  Patient personally seen and examined. History, laboratories, and x-rays reviewed. Patient's parents in room. Agree with history and physical as outlined above. Patient presents with nausea vomiting abdominal pain and diarrhea. A letter has resolved. She has recurrent pancreatitis and cystic pancreas issues by history. A large portion of her with medical care including surgeries have been performed in [place]. We're hopeful to get records.. I suspect she may have had an acute gastroenteritis to explain her nausea vomiting diarrhea and mild ileitis on CT. However, her current pain is quite typical of pancreatic origin maybe secondary to the cyst. At this point, a supportive care with hydration, pain control, and advance the diet as tolerated. In addition to review of outside records, she will likely need an endoscopic ultrasound with cyst aspiration. I have discussed this with the patient and her family. Also, modified her pain medication a bit. We'll follow  Guilherme Schwenke N. Natayla Cadenhead, Jr., M.D. [facility]    

## 2013-03-17 NOTE — Progress Notes (Signed)
PATIENT DETAILS Name: Mary [name] Age: 37 y.o. Sex: female Date of Birth: 23-May-1976 Admit Date: 03/15/2013 Admitting Physician Christiane Ha, MD PCP:No primary provider on file.  Subjective: Still complaining of abd pain-not able to tolerate liquids. No diarrhea.  Assessment/Plan: Principal Problem:   Terminal ileitis - Continue with current supportive care and Cipro with Flagyl -Not able to tolerate clear liquids, will make her NPO -since no improvement-have asked GI to consult - Await medical records from [place]-requested yesterday from [facility], have asked the secretary to see if we can call them and get it asap.  Active Problems: Hyponatremia - Suspect from dehydration-continue with IV fluids and monitor electrolytes -Na levels better today  Chronic idiopathic pancreatitis/chronic pancreatic cyst - On CT of the abdomen-cyst on the pancreas unchanged from 2002 -  Await medical records from [place]  Hypokalemia - Repleted, monitor lites    Obesity, morbid - Counseled regarding importance of weight loss  Disposition: Remain inpatient  DVT Prophylaxis: Prophylacticr Heparin  Code Status: Full code   Family Communication None  Procedures:  None  CONSULTS:  None   MEDICATIONS: Scheduled Meds: . ciprofloxacin  400 mg Intravenous Q12H  . heparin  5,000 Units Subcutaneous Q8H  . metronidazole  500 mg Intravenous Q8H  . pantoprazole (PROTONIX) IV  40 mg Intravenous Q24H   Continuous Infusions: . 0.9 % NaCl with KCl 40 mEq / L 125 mL/hr at 03/17/13 1049   PRN Meds:.acetaminophen, acetaminophen, diphenhydrAMINE, HYDROmorphone (DILAUDID) injection, ondansetron (ZOFRAN) IV, ondansetron  Antibiotics: Anti-infectives   Start     Dose/Rate Route Frequency Ordered Stop   03/15/13 1800  ciprofloxacin (CIPRO) IVPB 400 mg     400 mg 200 mL/hr over 60 Minutes Intravenous Every 12 hours 03/15/13 1045     03/15/13 1600  metroNIDAZOLE (FLAGYL) IVPB 500  mg     500 mg 100 mL/hr over 60 Minutes Intravenous Every 8 hours 03/15/13 1045     03/15/13 0630  ciprofloxacin (CIPRO) IVPB 400 mg     400 mg 200 mL/hr over 60 Minutes Intravenous  Once 03/15/13 0630 03/15/13 0753   03/15/13 0630  metroNIDAZOLE (FLAGYL) IVPB 500 mg     500 mg 100 mL/hr over 60 Minutes Intravenous  Once 03/15/13 0630 03/15/13 0854       PHYSICAL EXAM: Vital signs in last 24 hours: Filed Vitals:   03/16/13 2138 03/17/13 0032 03/17/13 0504 03/17/13 0921  BP: 102/68 120/74 126/70 103/58  Pulse: 82 82 80 79  Temp: 98.8 F (37.1 C) 99.7 F (37.6 C) 98.8 F (37.1 C) 98.2 F (36.8 C)  TempSrc: Oral Oral Oral Oral  Resp: 18 18 18 18   Height:      Weight:      SpO2: 97% 94% 94% 98%    Weight change:  Filed Weights   03/15/13 0932  Weight: 112 kg (246 lb 14.6 oz)   Body mass index is 42.36 kg/(m^2).   Gen Exam: Awake and alert with clear speech.   Neck: Supple, No JVD.   Chest: B/L Clear.   CVS: S1 S2 Regular, no murmurs.  Abdomen: soft, BS +, tender in the epigastric/ Peri-umbilical area without rebound, non distended. Obese abdomen Extremities: no edema, lower extremities warm to touch. Neurologic: Non Focal.   Skin: No Rash.   Wounds: N/A.    Intake/Output from previous day:  Intake/Output Summary (Last 24 hours) at 03/17/13 1111 Last data filed at 03/17/13 0912  Gross per 24 hour  Intake 1545.83 ml  Output    600 ml  Net 945.83 ml     LAB RESULTS: CBC  Recent Labs Lab 03/15/13 0410 03/15/13 1105 03/16/13 0645  WBC 13.7* 9.0 9.1  HGB 12.9 11.0* 10.6*  HCT 37.5 32.6* 31.5*  PLT 224 225 224  MCV 85.0 84.9 85.8  MCH 29.3 28.6 28.9  MCHC 34.4 33.7 33.7  RDW 13.4 13.5 13.9  LYMPHSABS 1.9  --  1.9  MONOABS 1.5*  --  1.0  EOSABS 0.0  --  0.0  BASOSABS 0.0  --  0.0    Chemistries   Recent Labs Lab 03/15/13 0410 03/15/13 1105 03/16/13 0645 03/17/13 0615  NA 132*  --  130* 134*  K 3.1*  --  4.3 4.8  CL 95*  --  101 102  CO2  22  --  19 20  GLUCOSE 124*  --  103* 105*  BUN 9  --  6 5*  CREATININE 0.70 0.74 0.69 0.76  CALCIUM 8.8  --  7.9* 8.7  MG  --  2.2  --   --     CBG:  Recent Labs Lab 03/17/13 0456  GLUCAP 79    GFR Estimated Creatinine Clearance: 120.2 ml/min (by C-G formula based on Cr of 0.76).  Coagulation profile No results found for this basename: INR, PROTIME,  in the last 168 hours  Cardiac Enzymes No results found for this basename: CK, CKMB, TROPONINI, MYOGLOBIN,  in the last 168 hours  No components found with this basename: POCBNP,  No results found for this basename: DDIMER,  in the last 72 hours No results found for this basename: HGBA1C,  in the last 72 hours No results found for this basename: CHOL, HDL, LDLCALC, TRIG, CHOLHDL, LDLDIRECT,  in the last 72 hours No results found for this basename: TSH, T4TOTAL, FREET3, T3FREE, THYROIDAB,  in the last 72 hours No results found for this basename: VITAMINB12, FOLATE, FERRITIN, TIBC, IRON, RETICCTPCT,  in the last 72 hours  Recent Labs  03/15/13 0410  LIPASE 11    Urine Studies No results found for this basename: UACOL, UAPR, USPG, UPH, UTP, UGL, UKET, UBIL, UHGB, UNIT, UROB, ULEU, UEPI, UWBC, URBC, UBAC, CAST, CRYS, UCOM, BILUA,  in the last 72 hours  MICROBIOLOGY: Recent Results (from the past 240 hour(s))  URINE CULTURE     Status: None   Collection Time    03/15/13  3:49 AM      Result Value Range Status   Specimen Description URINE, CLEAN CATCH   Final   Special Requests NONE   Final   Culture  Setup Time 03/15/2013 10:59   Final   Colony Count NO GROWTH   Final   Culture NO GROWTH   Final   Report Status 03/16/2013 FINAL   Final    RADIOLOGY STUDIES/RESULTS: Ct Abdomen Pelvis W Contrast  03/15/2013   *RADIOLOGY REPORT*  Clinical Data: Nausea, diarrhea, and fever.  CT ABDOMEN AND PELVIS WITH CONTRAST  Technique:  Multidetector CT imaging of the abdomen and pelvis was performed following the standard protocol  during bolus administration of intravenous contrast.  Contrast:  100 ml Omnipaque-300.  Comparison: No previous imaging available for comparison. Abdominal MRI report 02/23/2001.  Findings:  BODY WALL: There are three upper abdominal midline herniations containing fat and omental vessel.  LOWER CHEST:  Mediastinum:  9 mm right lower.  Esophageal lymph node. Neighboring esophagus is not dilated or thickened.  Lungs/pleura: No consolidation.  ABDOMEN/PELVIS:  Liver: Diffuse low  attenuation.  No focal abnormality.  Biliary: Cholecystectomy.  No biliary dilatation.  Pancreas: There is a septated cystic mass arising from the pancreatic body, with three macroscopic cysts.  In aggregate, the lesion measures 4.4 x 3.85 x 3 cm.  No enhancing nodule identified. No evidence of acute or previous pancreatitis. No main duct dilatation. No pancreatic dilatation.  Spleen: Unremarkable.  Adrenals: 17 mm nodule in the left adrenal body.  Kidneys and ureters: No hydronephrosis or stone.  Bladder: Unremarkable.  Bowel: No obstruction. There is thickening of the terminal ileal wall circumferentially by submucosal edema.  No other segmental of bowel inflammation identified.  There has been previous small bowel surgery with anastomosis in the left abdomen. No evidence of creeping fat or fistulization. Normal appendix.  Retroperitoneum: Ileocolic lymphadenopathy, presumed reactive to the above. Enlargement of left external iliac chain lymph node, 20 x 10 mm on image 82.  In the absence of additional unexplained intra-abdominal adenopathy, this is presumably reactive.  Peritoneum: No free fluid or gas.  Reproductive: Unremarkable.  Vascular: No acute abnormality.  OSSEOUS: No acute abnormalities. No suspicious lytic or blastic lesions.  IMPRESSION:  1.  Terminal ileitis which could be infectious or inflammatory.  No obstruction. 2.  4.3 cm macrocystic pancreatic mass, present since at least 2002. Favor low grade neoplasm (such as mucinous  cystic pancreatic tumor); surveillance imaging recommended. 3.  17 mm left adrenal nodule, likely adenoma.  4. Three ventral abdominal wall hernias containing omentum.   Original Report Authenticated By: Cynda Acres, MD  [facility] Pager:[phone]  If 7PM-7AM, please contact night-coverage [email or web address] Password [email or web address] 03/17/2013, 11:11 AM   LOS: 2 days

## 2013-03-18 DIAGNOSIS — K859 Acute pancreatitis without necrosis or infection, unspecified: Secondary | ICD-10-CM

## 2013-03-18 LAB — BASIC METABOLIC PANEL
BUN: 6 mg/dL (ref 6–23)
CO2: 19 mEq/L (ref 19–32)
Chloride: 105 mEq/L (ref 96–112)
Creatinine, Ser: 0.61 mg/dL (ref 0.50–1.10)

## 2013-03-18 LAB — CBC
HCT: 33.1 % — ABNORMAL LOW (ref 36.0–46.0)
MCV: 86.2 fL (ref 78.0–100.0)
Platelets: 309 10*3/uL (ref 150–400)
RBC: 3.84 MIL/uL — ABNORMAL LOW (ref 3.87–5.11)
WBC: 7.2 10*3/uL (ref 4.0–10.5)

## 2013-03-18 MED ORDER — HYDROMORPHONE 0.3 MG/ML IV SOLN
INTRAVENOUS | Status: DC
  Administered 2013-03-18: 10:00:00 via INTRAVENOUS
  Administered 2013-03-18: 0.5 mg via INTRAVENOUS
  Filled 2013-03-18: qty 25

## 2013-03-18 MED ORDER — DIPHENHYDRAMINE HCL 50 MG/ML IJ SOLN
12.5000 mg | Freq: Four times a day (QID) | INTRAMUSCULAR | Status: DC | PRN
  Administered 2013-03-22 – 2013-03-23 (×5): 12.5 mg via INTRAVENOUS
  Filled 2013-03-18 (×4): qty 1

## 2013-03-18 MED ORDER — HYDROMORPHONE HCL PF 1 MG/ML IJ SOLN
1.0000 mg | INTRAMUSCULAR | Status: DC | PRN
  Administered 2013-03-18 – 2013-03-19 (×13): 1 mg via INTRAVENOUS
  Filled 2013-03-18 (×14): qty 1

## 2013-03-18 MED ORDER — ONDANSETRON HCL 4 MG/2ML IJ SOLN
4.0000 mg | Freq: Four times a day (QID) | INTRAMUSCULAR | Status: DC | PRN
  Administered 2013-03-19 – 2013-03-22 (×6): 4 mg via INTRAVENOUS

## 2013-03-18 MED ORDER — DIPHENHYDRAMINE HCL 12.5 MG/5ML PO ELIX
12.5000 mg | ORAL_SOLUTION | Freq: Four times a day (QID) | ORAL | Status: DC | PRN
  Administered 2013-03-21 (×2): 12.5 mg via ORAL
  Filled 2013-03-18 (×3): qty 5

## 2013-03-18 MED ORDER — SODIUM CHLORIDE 0.9 % IJ SOLN: 9.0000 mL | INTRAMUSCULAR | Status: DC | PRN

## 2013-03-18 MED ORDER — NALOXONE HCL 0.4 MG/ML IJ SOLN: 0.4000 mg | INTRAMUSCULAR | Status: DC | PRN

## 2013-03-18 MED ORDER — PANTOPRAZOLE SODIUM 40 MG PO TBEC
40.0000 mg | DELAYED_RELEASE_TABLET | Freq: Every day | ORAL | Status: DC
  Administered 2013-03-18 – 2013-03-23 (×6): 40 mg via ORAL
  Filled 2013-03-18 (×7): qty 1

## 2013-03-18 NOTE — Progress Notes (Signed)
PATIENT DETAILS Name: Mary [name] Age: 37 y.o. Sex: female Date of Birth: Nov 28, 1975 Admit Date: 03/15/2013 Admitting Physician Christiane Ha, MD PCP:No primary provider on file.  Subjective: Abdominal pain slightly better, requesting clears.  Unable to tolerate a PCA pump because it made her feel claustrophobic with all of the connections.  Prefers PRN dilaudid.  Assessment/Plan: Principal Problem:  Terminal ileitis -Continue with current supportive care and Cipro with Flagyl -Advanced to non-fat clears. -Appreciate [facility] consultation - Records from [place] [facility] indicate chronic pancreatitis with Pancreatic cyst and previous Roux en Y surgical procedure.  Active Problems:  Hyponatremia -Resolved with IVF. - Suspect from dehydration-continue with IV fluids and monitor electrolytes   Chronic idiopathic pancreatitis/chronic pancreatic cyst - On CT of the abdomen-cyst on the pancreas unchanged from 2002 - Management per  GI - consideration of EUS next week or outpatient.  Hypokalemia -Resolved.    Obesity, morbid -Counseled regarding importance of weight loss  Psycho/Social - Patient reported to RN that she ended a 14 yr relationship w her partner just prior to admission.   Disposition: Remain inpatient  DVT Prophylaxis: Prophylacticr Heparin  Code Status: Full code   Family Communication None  Procedures:  None  CONSULTS:  None   MEDICATIONS: Scheduled Meds: . ciprofloxacin  400 mg Intravenous Q12H  . heparin  5,000 Units Subcutaneous Q8H  . metronidazole  500 mg Intravenous Q8H  . pantoprazole  40 mg Oral Q0600   Continuous Infusions:   PRN Meds:.acetaminophen, acetaminophen, diphenhydrAMINE, diphenhydrAMINE, diphenhydrAMINE, HYDROmorphone (DILAUDID) injection, naloxone, naphazoline, ondansetron (ZOFRAN) IV, ondansetron (ZOFRAN) IV, ondansetron, sodium chloride  Antibiotics: Anti-infectives    Start     Dose/Rate Route Frequency Ordered Stop   03/15/13 1800  ciprofloxacin (CIPRO) IVPB 400 mg     400 mg 200 mL/hr over 60 Minutes Intravenous Every 12 hours 03/15/13 1045     03/15/13 1600  metroNIDAZOLE (FLAGYL) IVPB 500 mg     500 mg 100 mL/hr over 60 Minutes Intravenous Every 8 hours 03/15/13 1045     03/15/13 0630  ciprofloxacin (CIPRO) IVPB 400 mg     400 mg 200 mL/hr over 60 Minutes Intravenous  Once 03/15/13 0630 03/15/13 0753   03/15/13 0630  metroNIDAZOLE (FLAGYL) IVPB 500 mg     500 mg 100 mL/hr over 60 Minutes Intravenous  Once 03/15/13 0630 03/15/13 0854       PHYSICAL EXAM: Vital signs in last 24 hours: Filed Vitals:   03/18/13 0742 03/18/13 0941 03/18/13 0958 03/18/13 1016  BP:   113/69 108/69  Pulse:   83 83  Temp:      TempSrc:      Resp: 18 12 18 20   Height:      Weight:      SpO2: 97% 96% 98%     Weight change:  Filed Weights   03/15/13 0932  Weight: 112 kg (246 lb 14.6 oz)   Body mass index is 42.36 kg/(m^2).   Gen Exam: Awake and alert with clear speech.  NAD Neck: Supple, No JVD.   Chest: B/L Clear.  No accessory muscle use CVS: S1 S2 Regular, no murmurs.  Abdomen: soft, BS +, tender in the epigastric/ Peri-umbilical area without rebound, non distended. Obese abdomen Extremities: no edema, lower extremities warm to touch. Neurologic: Non Focal.   Psych:  A&O, Cooperative, well groomed.   Intake/Output from previous day:  Intake/Output Summary (Last 24 hours) at 03/18/13 1238 Last data filed at 03/18/13  1610  Gross per 24 hour  Intake      0 ml  Output      0 ml  Net      0 ml     LAB RESULTS: CBC  Recent Labs Lab 03/15/13 0410 03/15/13 1105 03/16/13 0645 03/17/13 0615 03/18/13 0520  WBC 13.7* 9.0 9.1 7.7 7.2  HGB 12.9 11.0* 10.6* 10.7* 11.5*  HCT 37.5 32.6* 31.5* 31.8* 33.1*  PLT 224 225 224 280 309  MCV 85.0 84.9 85.8 86.4 86.2  MCH 29.3 28.6 28.9 29.1 29.9  MCHC 34.4 33.7 33.7 33.6 34.7  RDW 13.4 13.5 13.9 14.0  14.1  LYMPHSABS 1.9  --  1.9  --   --   MONOABS 1.5*  --  1.0  --   --   EOSABS 0.0  --  0.0  --   --   BASOSABS 0.0  --  0.0  --   --     Chemistries   Recent Labs Lab 03/15/13 0410 03/15/13 1105 03/16/13 0645 03/17/13 0615 03/18/13 0520  NA 132*  --  130* 134* 137  K 3.1*  --  4.3 4.8 4.6  CL 95*  --  101 102 105  CO2 22  --  19 20 19   GLUCOSE 124*  --  103* 105* 76  BUN 9  --  6 5* 6  CREATININE 0.70 0.74 0.69 0.76 0.61  CALCIUM 8.8  --  7.9* 8.7 8.9  MG  --  2.2  --   --   --     CBG:  Recent Labs Lab 03/17/13 0456  GLUCAP 79     MICROBIOLOGY: Recent Results (from the past 240 hour(s))  URINE CULTURE     Status: None   Collection Time    03/15/13  3:49 AM      Result Value Range Status   Specimen Description URINE, CLEAN CATCH   Final   Special Requests NONE   Final   Culture  Setup Time 03/15/2013 10:59   Final   Colony Count NO GROWTH   Final   Culture NO GROWTH   Final   Report Status 03/16/2013 FINAL   Final    RADIOLOGY STUDIES/RESULTS: Ct Abdomen Pelvis W Contrast  03/15/2013   *RADIOLOGY REPORT*  Clinical Data: Nausea, diarrhea, and fever.  CT ABDOMEN AND PELVIS WITH CONTRAST  Technique:  Multidetector CT imaging of the abdomen and pelvis was performed following the standard protocol during bolus administration of intravenous contrast.  Contrast:  100 ml Omnipaque-300.  Comparison: No previous imaging available for comparison. Abdominal MRI report 02/23/2001.  Findings:  BODY WALL: There are three upper abdominal midline herniations containing fat and omental vessel.  LOWER CHEST:  Mediastinum:  9 mm right lower.  Esophageal lymph node. Neighboring esophagus is not dilated or thickened.  Lungs/pleura: No consolidation.  ABDOMEN/PELVIS:  Liver: Diffuse low attenuation.  No focal abnormality.  Biliary: Cholecystectomy.  No biliary dilatation.  Pancreas: There is a septated cystic mass arising from the pancreatic body, with three macroscopic cysts.  In  aggregate, the lesion measures 4.4 x 3.85 x 3 cm.  No enhancing nodule identified. No evidence of acute or previous pancreatitis. No main duct dilatation. No pancreatic dilatation.  Spleen: Unremarkable.  Adrenals: 17 mm nodule in the left adrenal body.  Kidneys and ureters: No hydronephrosis or stone.  Bladder: Unremarkable.  Bowel: No obstruction. There is thickening of the terminal ileal wall circumferentially by submucosal edema.  No other segmental  of bowel inflammation identified.  There has been previous small bowel surgery with anastomosis in the left abdomen. No evidence of creeping fat or fistulization. Normal appendix.  Retroperitoneum: Ileocolic lymphadenopathy, presumed reactive to the above. Enlargement of left external iliac chain lymph node, 20 x 10 mm on image 82.  In the absence of additional unexplained intra-abdominal adenopathy, this is presumably reactive.  Peritoneum: No free fluid or gas.  Reproductive: Unremarkable.  Vascular: No acute abnormality.  OSSEOUS: No acute abnormalities. No suspicious lytic or blastic lesions.  IMPRESSION:  1.  Terminal ileitis which could be infectious or inflammatory.  No obstruction. 2.  4.3 cm macrocystic pancreatic mass, present since at least 2002. 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 3.  17 mm left adrenal nodule, likely adenoma.  4. Three ventral abdominal wall hernias containing omentum.   Original Report Authenticated By: Mateo Flow [facility] Pager:[phone]  If 7PM-7AM, please contact night-coverage [email or web address] Password [email or web address] 03/18/2013, 12:38 PM   LOS: 3 days   Attending - Patient seen and examined, agree with the above assessment and plan. Patient continues to have abdominal pain that is essentially unchanged from yesterday, on exam her abdomen is very soft. Her pain seems out of proportion to findings on the neurological studies.  We have finally obtained records from different hospitals in [place], she has had numerous hospitalizations in the past for similar issues. Appreciate GI consult. Will continue supportive care. I suspect some amount of opioid dependence this patient.  S Linas Stepter

## 2013-03-18 NOTE — Progress Notes (Signed)
Records from hospital in [place], [place]  These indicate hx of pancreatitis, pseudocysts Pt had Roux-en Y surgery and drainage in 2007   MD suspected opioid dependence.   Last admission 11/2010 with stable size, location of cystic changes in mid-body.   A dr Mickel Crow was the GI consultant he wondered if she had "small duct pancreatitis"  Jennye Moccasin, PA-C

## 2013-03-18 NOTE — Progress Notes (Signed)
     [facility] Daily Rounding Note 03/18/2013, 8:50 AM  SUBJECTIVE:       Ongoing epigastric pain.  Not as much nausea.  Urinating well. + flatus, no BMs.  PCA dilaudid started this AM. Hospital records pending,  She eroneously told us care at [facility] when it was actually at [facility] and [facility].  Faxed requests, awaiting results.   OBJECTIVE:         Vital signs in last 24 hours:    Temp:  [97.9 F (36.6 C)-98.8 F (37.1 C)] 97.9 F (36.6 C) (08/01 0516) Pulse Rate:  [65-82] 65 (08/01 0516) Resp:  [18-20] 18 (08/01 0516) BP: (100-121)/(58-74) 100/65 mmHg (08/01 0516) SpO2:  [95 %-100 %] 100 % (08/01 0516) Last BM Date: 03/15/13 General: obese, comfortable.   Heart: RRR Chest: clear.  Unlabored respirations.  Abdomen: obese, soft, tender in epigastrum with fullness.  BS hypoactive but present and not tympanitic  Extremities: no CCE Neuro/Psych:  Oriented x 3, not agitated.    Lab Results:  Recent Labs  03/16/13 0645 03/17/13 0615 03/18/13 0520  WBC 9.1 7.7 7.2  HGB 10.6* 10.7* 11.5*  HCT 31.5* 31.8* 33.1*  PLT 224 280 309   BMET  Recent Labs  03/16/13 0645 03/17/13 0615 03/18/13 0520  NA 130* 134* 137  K 4.3 4.8 4.6  CL 101 102 105  CO2 19 20 19   GLUCOSE 103* 105* 76  BUN 6 5* 6  CREATININE 0.69 0.76 0.61  CALCIUM 7.9* 8.7 8.9    ASSESMENT: * Ileitis. Ongoing abdominal pain and nausea but no diarrhea since admission. sxs started 7/27. ? Infectious vs Crohn's? Empiric Cipro/Flagyl day 4.  No fever, Leukocytosis resolved x 4 days.  * Pancreatic cystic mass. Hx of same and of idiopathic, recurrent pancreatitis dating back to 2002  S/p some sort of cyst drainage involving the small intestine. This after complications from stent placement.  * Ventral wall hernias  * Adrenal nodule.    PLAN: *  Supportive care.   *  Allow clear liquids.  *  Consider future (outpt?) EUS and cyst aspiration.   *  Await prior medical  records from [place].  *  ? Continue the empiric Cipro/Flagyl?  If so for how long?   LOS: 3 days   Jennye Moccasin  03/18/2013, 8:50 AM Pager: [phone]  GI ATTENDING  Patient personally seen and examined. Agree with interval history as outlined above. We did receive limited records from [place] outlining 1 hospitalization in April 2012 for abdominal pain. She was given the presumptive diagnosis of acute on chronic pancreatitis with normal pancreatic enzymes and liver enzymes. CT scan at that time stated that she had a pancreatic cystic lesion which was unchanged, though no dimensions of the abnormality provided. The report also states that in addition to cholecystectomy that she has had an unspecified prior cyst drainage procedure of the pancreas as well as a Roux-en-Y type surgery (not clear if it was separate from cyst drainage procedure). She apparently has had problems with pancreatitis since 2001. Finally, note was made that she may have narcotic dependence behavior. Clinically unchanged from yesterday. Continue with the management advance the diet as tolerated. We will see her first of the week. Decisions regarding EUS with cyst aspiration to be discussed with Dr. Christella Hartigan.  Wilhemina Bonito. Eda Keys., M.D. [facility] Division of Gastroenterology

## 2013-03-18 NOTE — Progress Notes (Signed)
Verified PCA settings   

## 2013-03-19 LAB — LIPASE, BLOOD: Lipase: 21 U/L (ref 11–59)

## 2013-03-19 LAB — BASIC METABOLIC PANEL
CO2: 18 mEq/L — ABNORMAL LOW (ref 19–32)
Chloride: 99 mEq/L (ref 96–112)
Creatinine, Ser: 0.59 mg/dL (ref 0.50–1.10)

## 2013-03-19 MED ORDER — HYDROMORPHONE HCL PF 1 MG/ML IJ SOLN
2.0000 mg | INTRAMUSCULAR | Status: DC | PRN
  Administered 2013-03-19 – 2013-03-21 (×13): 2 mg via INTRAVENOUS
  Filled 2013-03-19 (×13): qty 2

## 2013-03-19 NOTE — Progress Notes (Signed)
PATIENT DETAILS Name: Mary [name] Age: 37 y.o. Sex: female Date of Birth: 16-Jan-1976 Admit Date: 03/15/2013 Admitting Physician Christiane Ha, MD PCP:No primary provider on file.  Subjective: Feels slightly better  Assessment/Plan: Abdominal Pain -suspect this is more from acute on chronic pancreatitis, rather than just ileitis. -continue with supportive care -trial of clears today -c/w empiric Cipro/Flagy -c/w prn Dilaudid -GI on board  Hyponatremia  - Suspect from dehydration-continue with IV fluids and monitor electrolytes  -Na levels better   Chronic idiopathic pancreatitis/chronic pancreatic cyst  - On CT of the abdomen-cyst on the pancreas unchanged from 2002  - Await medical records from [place]   Hypokalemia  - Repleted, monitor lytes   Obesity, morbid  - Counseled regarding importance of weight loss   Disposition: Remain inpatient  DVT Prophylaxis: Prophylactic Heparin   Code Status: Full code   Family Communication None at bedside this am  Procedures:  None  CONSULTS:  GI   MEDICATIONS: Scheduled Meds: . ciprofloxacin  400 mg Intravenous Q12H  . heparin  5,000 Units Subcutaneous Q8H  . metronidazole  500 mg Intravenous Q8H  . pantoprazole  40 mg Oral Q0600   Continuous Infusions:  PRN Meds:.acetaminophen, acetaminophen, diphenhydrAMINE, diphenhydrAMINE, diphenhydrAMINE, HYDROmorphone (DILAUDID) injection, naloxone, naphazoline, ondansetron (ZOFRAN) IV, ondansetron (ZOFRAN) IV, ondansetron, sodium chloride  Antibiotics: Anti-infectives   Start     Dose/Rate Route Frequency Ordered Stop   03/15/13 1800  ciprofloxacin (CIPRO) IVPB 400 mg     400 mg 200 mL/hr over 60 Minutes Intravenous Every 12 hours 03/15/13 1045     03/15/13 1600  metroNIDAZOLE (FLAGYL) IVPB 500 mg     500 mg 100 mL/hr over 60 Minutes Intravenous Every 8 hours 03/15/13 1045     03/15/13 0630  ciprofloxacin (CIPRO) IVPB 400 mg     400 mg 200 mL/hr over 60  Minutes Intravenous  Once 03/15/13 0630 03/15/13 0753   03/15/13 0630  metroNIDAZOLE (FLAGYL) IVPB 500 mg     500 mg 100 mL/hr over 60 Minutes Intravenous  Once 03/15/13 0630 03/15/13 0854       PHYSICAL EXAM: Vital signs in last 24 hours: Filed Vitals:   03/18/13 1725 03/18/13 2100 03/19/13 0507 03/19/13 1056  BP: 120/74 103/67 96/59 104/66  Pulse: 76 76 66 69  Temp: 97.9 F (36.6 C) 98.4 F (36.9 C) 98.1 F (36.7 C) 97.8 F (36.6 C)  TempSrc: Oral Oral Oral Oral  Resp: 20 18 20 20   Height:      Weight:      SpO2: 96% 94% 91% 96%    Weight change:  Filed Weights   03/15/13 0932  Weight: 112 kg (246 lb 14.6 oz)   Body mass index is 42.36 kg/(m^2).   Gen Exam: Awake and alert with clear speech.   Neck: Supple, No JVD.   Chest: B/L Clear.   CVS: S1 S2 Regular, no murmurs.  Abdomen: soft, BS +, tender in the epigastric area, non distended.  Extremities: no edema, lower extremities warm to touch Neurologic: Non Focal.   Skin: No Rash.   Wounds: N/A.    Intake/Output from previous day:  Intake/Output Summary (Last 24 hours) at 03/19/13 1206 Last data filed at 03/18/13 1817  Gross per 24 hour  Intake    120 ml  Output      0 ml  Net    120 ml     LAB RESULTS: CBC  Recent Labs Lab 03/15/13 0410 03/15/13 1105 03/16/13 0645 03/17/13  0615 03/18/13 0520  WBC 13.7* 9.0 9.1 7.7 7.2  HGB 12.9 11.0* 10.6* 10.7* 11.5*  HCT 37.5 32.6* 31.5* 31.8* 33.1*  PLT 224 225 224 280 309  MCV 85.0 84.9 85.8 86.4 86.2  MCH 29.3 28.6 28.9 29.1 29.9  MCHC 34.4 33.7 33.7 33.6 34.7  RDW 13.4 13.5 13.9 14.0 14.1  LYMPHSABS 1.9  --  1.9  --   --   MONOABS 1.5*  --  1.0  --   --   EOSABS 0.0  --  0.0  --   --   BASOSABS 0.0  --  0.0  --   --     Chemistries   Recent Labs Lab 03/15/13 0410 03/15/13 1105 03/16/13 0645 03/17/13 0615 03/18/13 0520 03/19/13 0600  NA 132*  --  130* 134* 137 134*  K 3.1*  --  4.3 4.8 4.6 4.6  CL 95*  --  101 102 105 99  CO2 22  --  19  20 19  18*  GLUCOSE 124*  --  103* 105* 76 101*  BUN 9  --  6 5* 6 7  CREATININE 0.70 0.74 0.69 0.76 0.61 0.59  CALCIUM 8.8  --  7.9* 8.7 8.9 9.0  MG  --  2.2  --   --   --   --     CBG:  Recent Labs Lab 03/17/13 0456  GLUCAP 79    GFR Estimated Creatinine Clearance: 120.2 ml/min (by C-G formula based on Cr of 0.59).  Coagulation profile No results found for this basename: INR, PROTIME,  in the last 168 hours  Cardiac Enzymes No results found for this basename: CK, CKMB, TROPONINI, MYOGLOBIN,  in the last 168 hours  No components found with this basename: POCBNP,  No results found for this basename: DDIMER,  in the last 72 hours No results found for this basename: HGBA1C,  in the last 72 hours No results found for this basename: CHOL, HDL, LDLCALC, TRIG, CHOLHDL, LDLDIRECT,  in the last 72 hours No results found for this basename: TSH, T4TOTAL, FREET3, T3FREE, THYROIDAB,  in the last 72 hours No results found for this basename: VITAMINB12, FOLATE, FERRITIN, TIBC, IRON, RETICCTPCT,  in the last 72 hours  Recent Labs  03/19/13 0600  LIPASE 21    Urine Studies No results found for this basename: UACOL, UAPR, USPG, UPH, UTP, UGL, UKET, UBIL, UHGB, UNIT, UROB, ULEU, UEPI, UWBC, URBC, UBAC, CAST, CRYS, UCOM, BILUA,  in the last 72 hours  MICROBIOLOGY: Recent Results (from the past 240 hour(s))  URINE CULTURE     Status: None   Collection Time    03/15/13  3:49 AM      Result Value Range Status   Specimen Description URINE, CLEAN CATCH   Final   Special Requests NONE   Final   Culture  Setup Time 03/15/2013 10:59   Final   Colony Count NO GROWTH   Final   Culture NO GROWTH   Final   Report Status 03/16/2013 FINAL   Final    RADIOLOGY STUDIES/RESULTS: Ct Abdomen Pelvis W Contrast  03/15/2013   *RADIOLOGY REPORT*  Clinical Data: Nausea, diarrhea, and fever.  CT ABDOMEN AND PELVIS WITH CONTRAST  Technique:  Multidetector CT imaging of the abdomen and pelvis was performed  following the standard protocol during bolus administration of intravenous contrast.  Contrast:  100 ml Omnipaque-300.  Comparison: No previous imaging available for comparison. Abdominal MRI report 02/23/2001.  Findings:  BODY WALL: There  are three upper abdominal midline herniations containing fat and omental vessel.  LOWER CHEST:  Mediastinum:  9 mm right lower.  Esophageal lymph node. Neighboring esophagus is not dilated or thickened.  Lungs/pleura: No consolidation.  ABDOMEN/PELVIS:  Liver: Diffuse low attenuation.  No focal abnormality.  Biliary: Cholecystectomy.  No biliary dilatation.  Pancreas: There is a septated cystic mass arising from the pancreatic body, with three macroscopic cysts.  In aggregate, the lesion measures 4.4 x 3.85 x 3 cm.  No enhancing nodule identified. No evidence of acute or previous pancreatitis. No main duct dilatation. No pancreatic dilatation.  Spleen: Unremarkable.  Adrenals: 17 mm nodule in the left adrenal body.  Kidneys and ureters: No hydronephrosis or stone.  Bladder: Unremarkable.  Bowel: No obstruction. There is thickening of the terminal ileal wall circumferentially by submucosal edema.  No other segmental of bowel inflammation identified.  There has been previous small bowel surgery with anastomosis in the left abdomen. No evidence of creeping fat or fistulization. Normal appendix.  Retroperitoneum: Ileocolic lymphadenopathy, presumed reactive to the above. Enlargement of left external iliac chain lymph node, 20 x 10 mm on image 82.  In the absence of additional unexplained intra-abdominal adenopathy, this is presumably reactive.  Peritoneum: No free fluid or gas.  Reproductive: Unremarkable.  Vascular: No acute abnormality.  OSSEOUS: No acute abnormalities. No suspicious lytic or blastic lesions.  IMPRESSION:  1.  Terminal ileitis which could be infectious or inflammatory.  No obstruction. 2.  4.3 cm macrocystic pancreatic mass, present since at least 2002. 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 3.  17 mm left adrenal nodule, likely adenoma.  4. Three ventral abdominal wall hernias containing omentum.   Original Report Authenticated By: Cynda Acres, MD  [facility] Pager:[phone]  If 7PM-7AM, please contact night-coverage [email or web address] Password [email or web address] 03/19/2013, 12:06 PM   LOS: 4 days

## 2013-03-20 ENCOUNTER — Inpatient Hospital Stay (HOSPITAL_COMMUNITY): Payer: Self-pay

## 2013-03-20 LAB — CBC
HCT: 34.3 % — ABNORMAL LOW (ref 36.0–46.0)
Hemoglobin: 11.9 g/dL — ABNORMAL LOW (ref 12.0–15.0)
MCV: 85.3 fL (ref 78.0–100.0)
RBC: 4.02 MIL/uL (ref 3.87–5.11)
RDW: 13.9 % (ref 11.5–15.5)
WBC: 6.9 10*3/uL (ref 4.0–10.5)

## 2013-03-20 LAB — COMPREHENSIVE METABOLIC PANEL
BUN: 7 mg/dL (ref 6–23)
CO2: 20 mEq/L (ref 19–32)
Chloride: 97 mEq/L (ref 96–112)
Creatinine, Ser: 0.59 mg/dL (ref 0.50–1.10)
GFR calc Af Amer: >90 mL/min (ref >90)
GFR calc non Af Amer: >90 mL/min (ref >90)
Glucose, Bld: 81 mg/dL (ref 70–99)
Total Bilirubin: 0.3 mg/dL (ref 0.3–1.2)

## 2013-03-20 NOTE — Progress Notes (Signed)
PATIENT DETAILS Name: Mary [name] Age: 37 y.o. Sex: female Date of Birth: 10-24-1975 Admit Date: 03/15/2013 Admitting Physician Christiane Ha, MD PCP:No primary provider on file.  Subjective: Claims pain is slightly better, tolerating clear liquids.  Assessment/Plan: Abdominal Pain - Slow clinical improvement today -suspect this is more from acute on chronic pancreatitis, rather than just ileitis. -continue with supportive care, - Tolerating clears - will advance to full liquids hopefully tomorrow -c/w empiric Cipro/Flagy -c/w prn Dilaudid, will add MiraLax -GI on board  Hyponatremia  - Suspect from dehydration-continue with IV fluids and monitor electrolytes  -Na levels steady  Chronic idiopathic pancreatitis/chronic pancreatic cyst  - On CT of the abdomen-cyst on the pancreas unchanged from 2002  - Await medical records from [place]   Hypokalemia  - Repleted, monitor lytes   Obesity, morbid  - Counseled regarding importance of weight loss   Disposition: Remain inpatient  DVT Prophylaxis: Prophylactic Heparin   Code Status: Full code   Family Communication None at bedside this am  Procedures:  None  CONSULTS:  GI   MEDICATIONS: Scheduled Meds: . ciprofloxacin  400 mg Intravenous Q12H  . heparin  5,000 Units Subcutaneous Q8H  . metronidazole  500 mg Intravenous Q8H  . pantoprazole  40 mg Oral Q0600   Continuous Infusions:  PRN Meds:.acetaminophen, acetaminophen, diphenhydrAMINE, diphenhydrAMINE, diphenhydrAMINE, HYDROmorphone (DILAUDID) injection, naloxone, naphazoline, ondansetron (ZOFRAN) IV, ondansetron (ZOFRAN) IV, ondansetron, sodium chloride  Antibiotics: Anti-infectives   Start     Dose/Rate Route Frequency Ordered Stop   03/15/13 1800  ciprofloxacin (CIPRO) IVPB 400 mg     400 mg 200 mL/hr over 60 Minutes Intravenous Every 12 hours 03/15/13 1045     03/15/13 1600  metroNIDAZOLE (FLAGYL) IVPB 500 mg     500 mg 100 mL/hr over  60 Minutes Intravenous Every 8 hours 03/15/13 1045     03/15/13 0630  ciprofloxacin (CIPRO) IVPB 400 mg     400 mg 200 mL/hr over 60 Minutes Intravenous  Once 03/15/13 0630 03/15/13 0753   03/15/13 0630  metroNIDAZOLE (FLAGYL) IVPB 500 mg     500 mg 100 mL/hr over 60 Minutes Intravenous  Once 03/15/13 0630 03/15/13 0854       PHYSICAL EXAM: Vital signs in last 24 hours: Filed Vitals:   03/19/13 2206 03/20/13 0206 03/20/13 0507 03/20/13 1020  BP: 132/74 114/63 109/71 101/67  Pulse: 82 76 92 87  Temp: 97.7 F (36.5 C) 97.8 F (36.6 C) 98 F (36.7 C) 97.7 F (36.5 C)  TempSrc: Oral Oral Oral Oral  Resp: 18 18 18 18   Height:      Weight:      SpO2: 98% 99% 99% 99%    Weight change:  Filed Weights   03/15/13 0932  Weight: 112 kg (246 lb 14.6 oz)   Body mass index is 42.36 kg/(m^2).   Gen Exam: Awake and alert with clear speech.   Neck: Supple, No JVD.   Chest: B/L Clear.   CVS: S1 S2 Regular, no murmurs.  Abdomen: soft, BS +, remains with tenderness in the epigastric area, non distended. No rebound or rigidity. Extremities: no edema, lower extremities warm to touch Neurologic: Non Focal.   Skin: No Rash.   Wounds: N/A.    Intake/Output from previous day:  Intake/Output Summary (Last 24 hours) at 03/20/13 1135 Last data filed at 03/19/13 1915  Gross per 24 hour  Intake      0 ml  Output      1  ml  Net     -1 ml     LAB RESULTS: CBC  Recent Labs Lab 03/15/13 0410 03/15/13 1105 03/16/13 0645 03/17/13 0615 03/18/13 0520 03/20/13 0846  WBC 13.7* 9.0 9.1 7.7 7.2 6.9  HGB 12.9 11.0* 10.6* 10.7* 11.5* 11.9*  HCT 37.5 32.6* 31.5* 31.8* 33.1* 34.3*  PLT 224 225 224 280 309 364  MCV 85.0 84.9 85.8 86.4 86.2 85.3  MCH 29.3 28.6 28.9 29.1 29.9 29.6  MCHC 34.4 33.7 33.7 33.6 34.7 34.7  RDW 13.4 13.5 13.9 14.0 14.1 13.9  LYMPHSABS 1.9  --  1.9  --   --   --   MONOABS 1.5*  --  1.0  --   --   --   EOSABS 0.0  --  0.0  --   --   --   BASOSABS 0.0  --  0.0  --    --   --     Chemistries   Recent Labs Lab 03/15/13 0410 03/15/13 1105 03/16/13 0645 03/17/13 0615 03/18/13 0520 03/19/13 0600 03/20/13 0846  NA 132*  --  130* 134* 137 134* 132*  K 3.1*  --  4.3 4.8 4.6 4.6 3.9  CL 95*  --  101 102 105 99 97  CO2 22  --  19 20 19  18* 20  GLUCOSE 124*  --  103* 105* 76 101* 81  BUN 9  --  6 5* 6 7 7   CREATININE 0.70 0.74 0.69 0.76 0.61 0.59 0.59  CALCIUM 8.8  --  7.9* 8.7 8.9 9.0 9.0  MG  --  2.2  --   --   --   --   --     CBG:  Recent Labs Lab 03/17/13 0456  GLUCAP 79    GFR Estimated Creatinine Clearance: 120.2 ml/min (by C-G formula based on Cr of 0.59).  Coagulation profile No results found for this basename: INR, PROTIME,  in the last 168 hours  Cardiac Enzymes No results found for this basename: CK, CKMB, TROPONINI, MYOGLOBIN,  in the last 168 hours  No components found with this basename: POCBNP,  No results found for this basename: DDIMER,  in the last 72 hours No results found for this basename: HGBA1C,  in the last 72 hours No results found for this basename: CHOL, HDL, LDLCALC, TRIG, CHOLHDL, LDLDIRECT,  in the last 72 hours No results found for this basename: TSH, T4TOTAL, FREET3, T3FREE, THYROIDAB,  in the last 72 hours No results found for this basename: VITAMINB12, FOLATE, FERRITIN, TIBC, IRON, RETICCTPCT,  in the last 72 hours  Recent Labs  03/19/13 0600  LIPASE 21    Urine Studies No results found for this basename: UACOL, UAPR, USPG, UPH, UTP, UGL, UKET, UBIL, UHGB, UNIT, UROB, ULEU, UEPI, UWBC, URBC, UBAC, CAST, CRYS, UCOM, BILUA,  in the last 72 hours  MICROBIOLOGY: Recent Results (from the past 240 hour(s))  URINE CULTURE     Status: None   Collection Time    03/15/13  3:49 AM      Result Value Range Status   Specimen Description URINE, CLEAN CATCH   Final   Special Requests NONE   Final   Culture  Setup Time 03/15/2013 10:59   Final   Colony Count NO GROWTH   Final   Culture NO GROWTH   Final    Report Status 03/16/2013 FINAL   Final    RADIOLOGY STUDIES/RESULTS: Ct Abdomen Pelvis W Contrast  03/15/2013   *RADIOLOGY  REPORT*  Clinical Data: Nausea, diarrhea, and fever.  CT ABDOMEN AND PELVIS WITH CONTRAST  Technique:  Multidetector CT imaging of the abdomen and pelvis was performed following the standard protocol during bolus administration of intravenous contrast.  Contrast:  100 ml Omnipaque-300.  Comparison: No previous imaging available for comparison. Abdominal MRI report 02/23/2001.  Findings:  BODY WALL: There are three upper abdominal midline herniations containing fat and omental vessel.  LOWER CHEST:  Mediastinum:  9 mm right lower.  Esophageal lymph node. Neighboring esophagus is not dilated or thickened.  Lungs/pleura: No consolidation.  ABDOMEN/PELVIS:  Liver: Diffuse low attenuation.  No focal abnormality.  Biliary: Cholecystectomy.  No biliary dilatation.  Pancreas: There is a septated cystic mass arising from the pancreatic body, with three macroscopic cysts.  In aggregate, the lesion measures 4.4 x 3.85 x 3 cm.  No enhancing nodule identified. No evidence of acute or previous pancreatitis. No main duct dilatation. No pancreatic dilatation.  Spleen: Unremarkable.  Adrenals: 17 mm nodule in the left adrenal body.  Kidneys and ureters: No hydronephrosis or stone.  Bladder: Unremarkable.  Bowel: No obstruction. There is thickening of the terminal ileal wall circumferentially by submucosal edema.  No other segmental of bowel inflammation identified.  There has been previous small bowel surgery with anastomosis in the left abdomen. No evidence of creeping fat or fistulization. Normal appendix.  Retroperitoneum: Ileocolic lymphadenopathy, presumed reactive to the above. Enlargement of left external iliac chain lymph node, 20 x 10 mm on image 82.  In the absence of additional unexplained intra-abdominal adenopathy, this is presumably reactive.  Peritoneum: No free fluid or gas.   Reproductive: Unremarkable.  Vascular: No acute abnormality.  OSSEOUS: No acute abnormalities. No suspicious lytic or blastic lesions.  IMPRESSION:  1.  Terminal ileitis which could be infectious or inflammatory.  No obstruction. 2.  4.3 cm macrocystic pancreatic mass, present since at least 2002. 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 3.  17 mm left adrenal nodule, likely adenoma.  4. Three ventral abdominal wall hernias containing omentum.   Original Report Authenticated By: Cynda Acres, MD  [facility] Pager:[phone]  If 7PM-7AM, please contact night-coverage [email or web address] Password [email or web address] 03/20/2013, 11:35 AM   LOS: 5 days

## 2013-03-21 MED ORDER — HYDROCODONE-ACETAMINOPHEN 10-325 MG PO TABS
1.0000 | ORAL_TABLET | ORAL | Status: DC | PRN
  Administered 2013-03-21 – 2013-03-22 (×5): 2 via ORAL
  Filled 2013-03-21 (×9): qty 2

## 2013-03-21 MED ORDER — HYDROMORPHONE HCL PF 1 MG/ML IJ SOLN
2.0000 mg | Freq: Four times a day (QID) | INTRAMUSCULAR | Status: DC | PRN
  Administered 2013-03-21 – 2013-03-22 (×3): 2 mg via INTRAVENOUS
  Filled 2013-03-21 (×3): qty 2

## 2013-03-21 NOTE — Progress Notes (Signed)
     [facility] Daily Rounding Note 03/21/2013, 8:34 AM  SUBJECTIVE:       Non diarrheal stool yesterday.  Upper abd pain persists but better.  Dry heaves earlier yesterday.  Hungry today, waiting on grits. Used 16 mg Dilaudid yesterday.   OBJECTIVE:         Vital signs in last 24 hours:    Temp:  [97.5 F (36.4 C)-98.3 F (36.8 C)] 97.9 F (36.6 C) (08/04 0457) Pulse Rate:  [62-87] 62 (08/04 0457) Resp:  [18-20] 20 (08/04 0457) BP: (101-108)/(63-70) 108/63 mmHg (08/04 0457) SpO2:  [92 %-99 %] 96 % (08/04 0457) Last BM Date: 03/18/13 General: obese, non-toxic.  comfortable   Heart: RRR.  No MRG Chest: clear.  Unlabored breathing Abdomen: soft, obese, BS active.  Tender (mild) in upper abdomen to LUQ.  Extremities: no CCE Neuro/Psych:  Cooperative, pleasant, relaxed.    Intake/Output from previous day:    Intake/Output this shift:    Lab Results:  Recent Labs  03/20/13 0846  WBC 6.9  HGB 11.9*  HCT 34.3*  PLT 364   BMET  Recent Labs  03/19/13 0600 03/20/13 0846  NA 134* 132*  K 4.6 3.9  CL 99 97  CO2 18* 20  GLUCOSE 101* 81  BUN 7 7  CREATININE 0.59 0.59  CALCIUM 9.0 9.0   LFT  Recent Labs  03/20/13 0846  PROT 7.4  ALBUMIN 3.1*  AST 35  ALT 19  ALKPHOS 62  BILITOT 0.3    Studies/Results: Dg Abd 2 Views 03/20/2013   *RADIOLOGY REPORT*  Clinical Data: Small bowel obstruction, abdominal pain.  ABDOMEN - 2 VIEW  Comparison: 03/15/2013 CT.  Findings: Prior cholecystectomy. There is normal bowel gas pattern. No free air.  No organomegaly or suspicious calcification.  No acute bony abnormality.  IMPRESSION: No evidence of obstruction or free air.   Original Report Authenticated By: Charlett Nose, M.D.    ASSESMENT: *  Acute recurrent abdominal pain with diarrhea.  Hx recurrent pancreatitis and pancreatic cyst(s) dating back to at least 2002.  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  Admission 1 week ago with acute onset pain and diarrhea, normal  lipase, and CT with 4.4 x 3.8 x 3 cm septated cyst in pancreatic body, ileitis.  For several days at onset of admission ceased having BMs.  Day 7 empiric Cipro and Flagyl.    *  Hyponatremia.  *  Morbid Obesity.   PLAN *  Stop Cipro/Flagyl *  Full liquid diet as ordered this AM.  *  Note addition of Hydrocodone for pain mgt.    LOS: 6 days   Jennye Moccasin  03/21/2013, 8:34 AM Pager: [phone]    Attending physician's note   I have taken an interval history, reviewed the chart and examined the patient. I agree with the Advanced Practitioner's note, impression and recommendations. She is improving and tolerated full liquids without difficulty. Consider outpatient EUS to further evaluate the pancreatic cyst-Dr. Christella Hartigan to review. Continue supportive care.  Venita Lick. Russella Dar, MD [facility]

## 2013-03-21 NOTE — Progress Notes (Signed)
PATIENT DETAILS Name: Mary [name] Age: 37 y.o. Sex: female Date of Birth: 07/06/1976 Admit Date: 03/15/2013 Admitting Physician Christiane Ha, MD PCP:No primary provider on file.  Subjective: Pain slightly better-tolerating clears  Assessment/Plan: Abdominal Pain - Slow clinical improvement today -suspect this is more from acute on chronic pancreatitis, rather than just ileitis. -continue with supportive care, -Tolerating clears - will advance to full liquids today -stop Cipro/Flagy on 8/4 that was started on admission -c/w prn Dilaudid, will add MiraLax. Start oral vicodin, in an attempt to wean off IV Dilaudid -GI on board  Hyponatremia  - Suspect from dehydration-continue with IV fluids and monitor electrolytes  -Na levels steady  Chronic idiopathic pancreatitis/chronic pancreatic cyst  - On CT of the abdomen-cyst on the pancreas unchanged from 2002  - Await medical records from [place]    Hypokalemia  - Repleted, monitor lytes   Obesity, morbid  - Counseled regarding importance of weight loss   Disposition: Remain inpatient  DVT Prophylaxis: Prophylactic Heparin   Code Status: Full code   Family Communication None at bedside this am  Procedures:  None  CONSULTS:  GI   MEDICATIONS: Scheduled Meds: . heparin  5,000 Units Subcutaneous Q8H  . pantoprazole  40 mg Oral Q0600   Continuous Infusions:  PRN Meds:.acetaminophen, acetaminophen, diphenhydrAMINE, diphenhydrAMINE, diphenhydrAMINE, HYDROcodone-acetaminophen, HYDROmorphone (DILAUDID) injection, naloxone, naphazoline, ondansetron (ZOFRAN) IV, ondansetron (ZOFRAN) IV, ondansetron, sodium chloride  Antibiotics: Anti-infectives   Start     Dose/Rate Route Frequency Ordered Stop   03/15/13 1800  ciprofloxacin (CIPRO) IVPB 400 mg  Status:  Discontinued     400 mg 200 mL/hr over 60 Minutes Intravenous Every 12 hours 03/15/13 1045 03/21/13 0936   03/15/13 1600  metroNIDAZOLE (FLAGYL) IVPB  500 mg  Status:  Discontinued     500 mg 100 mL/hr over 60 Minutes Intravenous Every 8 hours 03/15/13 1045 03/21/13 0936   03/15/13 0630  ciprofloxacin (CIPRO) IVPB 400 mg     400 mg 200 mL/hr over 60 Minutes Intravenous  Once 03/15/13 0630 03/15/13 0753   03/15/13 0630  metroNIDAZOLE (FLAGYL) IVPB 500 mg     500 mg 100 mL/hr over 60 Minutes Intravenous  Once 03/15/13 0630 03/15/13 0854       PHYSICAL EXAM: Vital signs in last 24 hours: Filed Vitals:   03/20/13 2211 03/21/13 0119 03/21/13 0154 03/21/13 0457  BP:   101/67 108/63  Pulse: 76  73 62  Temp: 98.3 F (36.8 C) 97.7 F (36.5 C) 98 F (36.7 C) 97.9 F (36.6 C)  TempSrc: Oral Oral Oral Oral  Resp: 18  18 20   Height:      Weight:      SpO2: 96%  92% 96%    Weight change:  Filed Weights   03/15/13 0932  Weight: 112 kg (246 lb 14.6 oz)   Body mass index is 42.36 kg/(m^2).   Gen Exam: Awake and alert with clear speech.   Neck: Supple, No JVD.   Chest: B/L Clear.   CVS: S1 S2 Regular, no murmurs.  Abdomen: soft, BS +, remains with tenderness in the epigastric area, non distended. No rebound or rigidity. Extremities: no edema, lower extremities warm to touch Neurologic: Non Focal.   Skin: No Rash.   Wounds: N/A.    Intake/Output from previous day: No intake or output data in the 24 hours ending 03/21/13 1123   LAB RESULTS: CBC  Recent Labs Lab 03/15/13 0410 03/15/13 1105 03/16/13 0645 03/17/13 0615 03/18/13 0520 03/20/13  0846  WBC 13.7* 9.0 9.1 7.7 7.2 6.9  HGB 12.9 11.0* 10.6* 10.7* 11.5* 11.9*  HCT 37.5 32.6* 31.5* 31.8* 33.1* 34.3*  PLT 224 225 224 280 309 364  MCV 85.0 84.9 85.8 86.4 86.2 85.3  MCH 29.3 28.6 28.9 29.1 29.9 29.6  MCHC 34.4 33.7 33.7 33.6 34.7 34.7  RDW 13.4 13.5 13.9 14.0 14.1 13.9  LYMPHSABS 1.9  --  1.9  --   --   --   MONOABS 1.5*  --  1.0  --   --   --   EOSABS 0.0  --  0.0  --   --   --   BASOSABS 0.0  --  0.0  --   --   --     Chemistries   Recent Labs Lab  03/15/13 0410 03/15/13 1105 03/16/13 0645 03/17/13 0615 03/18/13 0520 03/19/13 0600 03/20/13 0846  NA 132*  --  130* 134* 137 134* 132*  K 3.1*  --  4.3 4.8 4.6 4.6 3.9  CL 95*  --  101 102 105 99 97  CO2 22  --  19 20 19  18* 20  GLUCOSE 124*  --  103* 105* 76 101* 81  BUN 9  --  6 5* 6 7 7   CREATININE 0.70 0.74 0.69 0.76 0.61 0.59 0.59  CALCIUM 8.8  --  7.9* 8.7 8.9 9.0 9.0  MG  --  2.2  --   --   --   --   --     CBG:  Recent Labs Lab 03/17/13 0456  GLUCAP 79    GFR Estimated Creatinine Clearance: 120.2 ml/min (by C-G formula based on Cr of 0.59).  Coagulation profile No results found for this basename: INR, PROTIME,  in the last 168 hours  Cardiac Enzymes No results found for this basename: CK, CKMB, TROPONINI, MYOGLOBIN,  in the last 168 hours  No components found with this basename: POCBNP,  No results found for this basename: DDIMER,  in the last 72 hours No results found for this basename: HGBA1C,  in the last 72 hours No results found for this basename: CHOL, HDL, LDLCALC, TRIG, CHOLHDL, LDLDIRECT,  in the last 72 hours No results found for this basename: TSH, T4TOTAL, FREET3, T3FREE, THYROIDAB,  in the last 72 hours No results found for this basename: VITAMINB12, FOLATE, FERRITIN, TIBC, IRON, RETICCTPCT,  in the last 72 hours  Recent Labs  03/19/13 0600  LIPASE 21    Urine Studies No results found for this basename: UACOL, UAPR, USPG, UPH, UTP, UGL, UKET, UBIL, UHGB, UNIT, UROB, ULEU, UEPI, UWBC, URBC, UBAC, CAST, CRYS, UCOM, BILUA,  in the last 72 hours  MICROBIOLOGY: Recent Results (from the past 240 hour(s))  URINE CULTURE     Status: None   Collection Time    03/15/13  3:49 AM      Result Value Range Status   Specimen Description URINE, CLEAN CATCH   Final   Special Requests NONE   Final   Culture  Setup Time 03/15/2013 10:59   Final   Colony Count NO GROWTH   Final   Culture NO GROWTH   Final   Report Status 03/16/2013 FINAL   Final     RADIOLOGY STUDIES/RESULTS: Ct Abdomen Pelvis W Contrast  03/15/2013   *RADIOLOGY REPORT*  Clinical Data: Nausea, diarrhea, and fever.  CT ABDOMEN AND PELVIS WITH CONTRAST  Technique:  Multidetector CT imaging of the abdomen and pelvis was performed following the standard  protocol during bolus administration of intravenous contrast.  Contrast:  100 ml Omnipaque-300.  Comparison: No previous imaging available for comparison. Abdominal MRI report 02/23/2001.  Findings:  BODY WALL: There are three upper abdominal midline herniations containing fat and omental vessel.  LOWER CHEST:  Mediastinum:  9 mm right lower.  Esophageal lymph node. Neighboring esophagus is not dilated or thickened.  Lungs/pleura: No consolidation.  ABDOMEN/PELVIS:  Liver: Diffuse low attenuation.  No focal abnormality.  Biliary: Cholecystectomy.  No biliary dilatation.  Pancreas: There is a septated cystic mass arising from the pancreatic body, with three macroscopic cysts.  In aggregate, the lesion measures 4.4 x 3.85 x 3 cm.  No enhancing nodule identified. No evidence of acute or previous pancreatitis. No main duct dilatation. No pancreatic dilatation.  Spleen: Unremarkable.  Adrenals: 17 mm nodule in the left adrenal body.  Kidneys and ureters: No hydronephrosis or stone.  Bladder: Unremarkable.  Bowel: No obstruction. There is thickening of the terminal ileal wall circumferentially by submucosal edema.  No other segmental of bowel inflammation identified.  There has been previous small bowel surgery with anastomosis in the left abdomen. No evidence of creeping fat or fistulization. Normal appendix.  Retroperitoneum: Ileocolic lymphadenopathy, presumed reactive to the above. Enlargement of left external iliac chain lymph node, 20 x 10 mm on image 82.  In the absence of additional unexplained intra-abdominal adenopathy, this is presumably reactive.  Peritoneum: No free fluid or gas.  Reproductive: Unremarkable.  Vascular: No acute  abnormality.  OSSEOUS: No acute abnormalities. No suspicious lytic or blastic lesions.  IMPRESSION:  1.  Terminal ileitis which could be infectious or inflammatory.  No obstruction. 2.  4.3 cm macrocystic pancreatic mass, present since at least 2002. 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 3.  17 mm left adrenal nodule, likely adenoma.  4. Three ventral abdominal wall hernias containing omentum.   Original Report Authenticated By: Cynda Acres, MD  [facility] Pager:[phone]  If 7PM-7AM, please contact night-coverage [email or web address] Password [email or web address] 03/21/2013, 11:23 AM   LOS: 6 days

## 2013-03-22 MED ORDER — POLYETHYLENE GLYCOL 3350 17 G PO PACK
17.0000 g | PACK | Freq: Every day | ORAL | Status: DC | PRN
  Administered 2013-03-22: 17 g via ORAL
  Filled 2013-03-22: qty 1

## 2013-03-22 MED ORDER — BOOST / RESOURCE BREEZE PO LIQD
1.0000 | Freq: Three times a day (TID) | ORAL | Status: DC
  Administered 2013-03-22 (×3): 1 via ORAL

## 2013-03-22 MED ORDER — HYDROMORPHONE HCL PF 1 MG/ML IJ SOLN
1.0000 mg | Freq: Three times a day (TID) | INTRAMUSCULAR | Status: DC | PRN
  Administered 2013-03-22 – 2013-03-23 (×3): 1 mg via INTRAVENOUS
  Filled 2013-03-22 (×3): qty 1

## 2013-03-22 MED ORDER — FAMOTIDINE 20 MG PO TABS
20.0000 mg | ORAL_TABLET | Freq: Once | ORAL | Status: AC
  Administered 2013-03-22: 20 mg via ORAL
  Filled 2013-03-22: qty 1

## 2013-03-22 NOTE — Progress Notes (Signed)
Pt complaining of acid reflux. Pt was given scheduled protonix, some prn zofran, and np kirby ordered for pt to get a one time dose of pepcid po. Will continue to monitor pt.

## 2013-03-22 NOTE — Progress Notes (Signed)
PATIENT DETAILS Name: Mary [name] Age: 37 y.o. Sex: female Date of Birth: [birth year] Admit Date: 03/15/2013 Admitting Physician Christiane Ha, MD PCP:No primary provider on file.  Subjective: Pain improving overall, but bad this morning (she has not requested pain med since 2:00 am).  Tolerating full liquids.  Assessment/Plan: Abdominal Pain - Slow clinical improvement will advance diet to D3 -suspect this is more from acute on chronic pancreatitis, rather than just ileitis. -continue with supportive care, - Cipro/Flagy 7/29 - 8/4  -c/w prn Dilaudid (decrease dose and frequency),oral vicodin added  Hyponatremia  -Suspect from dehydration-continue with IV fluids and monitor electrolytes  -Na levels steady  Chronic idiopathic pancreatitis/chronic pancreatic cyst  - On CT of the abdomen-cyst on the pancreas unchanged from 2002  - medical records from [place] reviewed.    Hypokalemia  -Repleted, monitor lytes   Obesity, morbid  -Counseled regarding importance of weight loss   Low albumin -secondary to chronic illness -Resource breeze supplements  Disposition: Remain inpatient.  D/C planned for 8/6  DVT Prophylaxis: Prophylactic Heparin   Code Status: Full code   Family Communication None at bedside this am  Procedures:  None  CONSULTS:  GI   MEDICATIONS: Scheduled Meds: . heparin  5,000 Units Subcutaneous Q8H  . pantoprazole  40 mg Oral Q0600   Continuous Infusions:  PRN Meds:.acetaminophen, acetaminophen, diphenhydrAMINE, diphenhydrAMINE, HYDROcodone-acetaminophen, HYDROmorphone (DILAUDID) injection, naloxone, naphazoline, ondansetron (ZOFRAN) IV, ondansetron (ZOFRAN) IV, ondansetron, sodium chloride  Antibiotics: Anti-infectives   Start     Dose/Rate Route Frequency Ordered Stop   03/15/13 1800  ciprofloxacin (CIPRO) IVPB 400 mg  Status:  Discontinued     400 mg 200 mL/hr over 60 Minutes Intravenous Every 12 hours 03/15/13 1045 03/21/13  0936   03/15/13 1600  metroNIDAZOLE (FLAGYL) IVPB 500 mg  Status:  Discontinued     500 mg 100 mL/hr over 60 Minutes Intravenous Every 8 hours 03/15/13 1045 03/21/13 0936   03/15/13 0630  ciprofloxacin (CIPRO) IVPB 400 mg     400 mg 200 mL/hr over 60 Minutes Intravenous  Once 03/15/13 0630 03/15/13 0753   03/15/13 0630  metroNIDAZOLE (FLAGYL) IVPB 500 mg     500 mg 100 mL/hr over 60 Minutes Intravenous  Once 03/15/13 0630 03/15/13 0854       PHYSICAL EXAM: Vital signs in last 24 hours: Filed Vitals:   03/21/13 1430 03/21/13 2208 03/22/13 0224 03/22/13 0612  BP: 110/69 120/71 113/78 102/68  Pulse: 77 77 65 63  Temp: 97.8 F (36.6 C) 98.7 F (37.1 C) 97.7 F (36.5 C) 97.6 F (36.4 C)  TempSrc: Oral Oral Oral Oral  Resp: 20 18 18 16   Height:      Weight:      SpO2: 97% 97% 99% 94%    Weight change:  Filed Weights   03/15/13 0932  Weight: 112 kg (246 lb 14.6 oz)   Body mass index is 42.36 kg/(m^2).   Gen Exam: Awake and alert with clear speech.  Thinning hair. Neck: Supple, No JVD.   Chest: B/L Clear.  No w/c/r CVS: S1 S2 Regular, no murmurs.  Abdomen: soft, BS +, remains with tenderness in the epigastric area, non distended. No rebound or rigidity. Extremities: no edema, lower extremities warm to touch.  Able to ambulate Neurologic: Non Focal.   Skin: No Rash.      Intake/Output from previous day:  Intake/Output Summary (Last 24 hours) at 03/22/13 0916 Last data filed at 03/21/13 1300  Gross per 24 hour  Intake    600 ml  Output      0 ml  Net    600 ml     LAB RESULTS: CBC  Recent Labs Lab 03/15/13 1105 03/16/13 0645 03/17/13 0615 03/18/13 0520 03/20/13 0846  WBC 9.0 9.1 7.7 7.2 6.9  HGB 11.0* 10.6* 10.7* 11.5* 11.9*  HCT 32.6* 31.5* 31.8* 33.1* 34.3*  PLT 225 224 280 309 364  MCV 84.9 85.8 86.4 86.2 85.3  MCH 28.6 28.9 29.1 29.9 29.6  MCHC 33.7 33.7 33.6 34.7 34.7  RDW 13.5 13.9 14.0 14.1 13.9  LYMPHSABS  --  1.9  --   --   --   MONOABS   --  1.0  --   --   --   EOSABS  --  0.0  --   --   --   BASOSABS  --  0.0  --   --   --     Chemistries   Recent Labs Lab 03/15/13 1105 03/16/13 0645 03/17/13 0615 03/18/13 0520 03/19/13 0600 03/20/13 0846  NA  --  130* 134* 137 134* 132*  K  --  4.3 4.8 4.6 4.6 3.9  CL  --  101 102 105 99 97  CO2  --  19 20 19  18* 20  GLUCOSE  --  103* 105* 76 101* 81  BUN  --  6 5* 6 7 7   CREATININE 0.74 0.69 0.76 0.61 0.59 0.59  CALCIUM  --  7.9* 8.7 8.9 9.0 9.0  MG 2.2  --   --   --   --   --     CBG:  Recent Labs Lab 03/17/13 0456  GLUCAP 79    MICROBIOLOGY: Recent Results (from the past 240 hour(s))  URINE CULTURE     Status: None   Collection Time    03/15/13  3:49 AM      Result Value Range Status   Specimen Description URINE, CLEAN CATCH   Final   Special Requests NONE   Final   Culture  Setup Time 03/15/2013 10:59   Final   Colony Count NO GROWTH   Final   Culture NO GROWTH   Final   Report Status 03/16/2013 FINAL   Final    RADIOLOGY STUDIES/RESULTS: Ct Abdomen Pelvis W Contrast  03/15/2013   *RADIOLOGY REPORT*  Clinical Data: Nausea, diarrhea, and fever.  CT ABDOMEN AND PELVIS WITH CONTRAST  Technique:  Multidetector CT imaging of the abdomen and pelvis was performed following the standard protocol during bolus administration of intravenous contrast.  Contrast:  100 ml Omnipaque-300.  Comparison: No previous imaging available for comparison. Abdominal MRI report 02/23/2001.  Findings:  BODY WALL: There are three upper abdominal midline herniations containing fat and omental vessel.  LOWER CHEST:  Mediastinum:  9 mm right lower.  Esophageal lymph node. Neighboring esophagus is not dilated or thickened.  Lungs/pleura: No consolidation.  ABDOMEN/PELVIS:  Liver: Diffuse low attenuation.  No focal abnormality.  Biliary: Cholecystectomy.  No biliary dilatation.  Pancreas: There is a septated cystic mass arising from the pancreatic body, with three macroscopic cysts.  In  aggregate, the lesion measures 4.4 x 3.85 x 3 cm.  No enhancing nodule identified. No evidence of acute or previous pancreatitis. No main duct dilatation. No pancreatic dilatation.  Spleen: Unremarkable.  Adrenals: 17 mm nodule in the left adrenal body.  Kidneys and ureters: No hydronephrosis or stone.  Bladder: Unremarkable.  Bowel: No obstruction. There is thickening of the terminal ileal  wall circumferentially by submucosal edema.  No other segmental of bowel inflammation identified.  There has been previous small bowel surgery with anastomosis in the left abdomen. No evidence of creeping fat or fistulization. Normal appendix.  Retroperitoneum: Ileocolic lymphadenopathy, presumed reactive to the above. Enlargement of left external iliac chain lymph node, 20 x 10 mm on image 82.  In the absence of additional unexplained intra-abdominal adenopathy, this is presumably reactive.  Peritoneum: No free fluid or gas.  Reproductive: Unremarkable.  Vascular: No acute abnormality.  OSSEOUS: No acute abnormalities. No suspicious lytic or blastic lesions.  IMPRESSION:  1.  Terminal ileitis which could be infectious or inflammatory.  No obstruction. 2.  4.3 cm macrocystic pancreatic mass, present since at least 2002. 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 3.  17 mm left adrenal nodule, likely adenoma.  4. Three ventral abdominal wall hernias containing omentum.   Original Report Authenticated By: Mateo Flow [facility] Pager:[phone]  If 7PM-7AM, please contact night-coverage [email or web address] Password [email or web address] 03/22/2013, 9:16 AM   LOS: 7 days    Attending Patient seen and examined, agree with the assessment and plan as outlined above. Continue to minimize Dilaudid in an attempt to taper it off, continue Vicodin. Advance to Dys 3 diet today, hopefully home in am.  S Sullivan Jacuinde

## 2013-03-22 NOTE — Progress Notes (Signed)
     [facility] GI Daily Rounding Note 03/22/2013, 9:01 AM  SUBJECTIVE:       Given prn dose of Pepcid after c/o reflux despite daily Protonix.  Used 6 mg Dilaudid and 6 mg hydrocodone in total yesterday. This is decreased from previous days.   Nausea periodically. No vomiting.  No BMs since 8/3.   Advanced to mech soft diet this AM.   OBJECTIVE:         Vital signs in last 24 hours:    Temp:  [97.6 F (36.4 C)-98.7 F (37.1 C)] 97.6 F (36.4 C) (08/05 0612) Pulse Rate:  [63-77] 63 (08/05 0612) Resp:  [16-20] 16 (08/05 0612) BP: (102-120)/(68-78) 102/68 mmHg (08/05 0612) SpO2:  [94 %-99 %] 94 % (08/05 0612) Last BM Date: 03/18/13 General: obese, non=ill looking   Heart: rrr Chest: clear bil.  Unlabored breathing Abdomen: obese, soft, fullness and moderate tenderness in mid upper abdomen.  No guard or rebound  Extremities: no CCE Neuro/Psych:  Pleasant, not somnolent.  Oriented x 3.   Intake/Output from previous day: 08/04 0701 - 08/05 0700 In: 600 [P.O.:600] Out: -   Intake/Output this shift:    Lab Results:  Recent Labs  03/20/13 0846  WBC 6.9  HGB 11.9*  HCT 34.3*  PLT 364   BMET  Recent Labs  03/20/13 0846  NA 132*  K 3.9  CL 97  CO2 20  GLUCOSE 81  BUN 7  CREATININE 0.59  CALCIUM 9.0   LFT  Recent Labs  03/20/13 0846  PROT 7.4  ALBUMIN 3.1*  AST 35  ALT 19  ALKPHOS 62  BILITOT 0.3    ASSESMENT: * Acute recurrent abdominal pain with diarrhea. Hx recurrent pancreatitis and pancreatic cyst(s) dating back to at least 2002. 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 Admission 03/15/13 with acute onset pain and diarrhea, normal lipase, and CT with 4.4 x 3.8 x 3 cm septated cyst in pancreatic body, ileitis.  Diarrhea and leukocytosis promptly resolved. Completed 7 days empiric Cipro and Flagyl on 8/4 The acute and lingering GI sxs more c/w pancreatitis/pancreatic cyst, than from ileitis. They are slowly improving * Hyponatremia.  * Morbid  Obesity. BMI 42. 246#/112 kg.   PLAN: *  Trial dose of Miralax, see if having a BM helps alleviate nausea.    LOS: 7 days   Jennye Moccasin  03/22/2013, 9:01 AM Pager: [phone]    Attending physician's note   I have taken an interval history, reviewed the chart and examined the patient. I agree with the Advanced Practitioner's note, impression and recommendations. She is steadily improving. Presumed pancreatitis flare or infectious gastroenteritis/ileitis. Continue supportive care.   Venita Lick. Russella Dar, MD [facility]

## 2013-03-23 ENCOUNTER — Telehealth: Payer: Self-pay

## 2013-03-23 ENCOUNTER — Other Ambulatory Visit: Payer: Self-pay

## 2013-03-23 DIAGNOSIS — K862 Cyst of pancreas: Secondary | ICD-10-CM

## 2013-03-23 MED ORDER — ONDANSETRON 4 MG PO TBDP: 4.0000 mg | ORAL_TABLET | Freq: Three times a day (TID) | ORAL | Status: DC | PRN

## 2013-03-23 MED ORDER — SENNOSIDES-DOCUSATE SODIUM 8.6-50 MG PO TABS: 2.0000 | ORAL_TABLET | Freq: Every day | ORAL | Status: DC

## 2013-03-23 MED ORDER — PANCRELIPASE (LIP-PROT-AMYL) 3000-9500 UNITS PO CPEP: 2.0000 | ORAL_CAPSULE | Freq: Three times a day (TID) | ORAL | Status: DC

## 2013-03-23 MED ORDER — POLYETHYLENE GLYCOL 3350 17 G PO PACK: 17.0000 g | PACK | Freq: Every day | ORAL | Status: DC | PRN

## 2013-03-23 MED ORDER — PANTOPRAZOLE SODIUM 40 MG PO TBEC: 40.0000 mg | DELAYED_RELEASE_TABLET | Freq: Every day | ORAL | Status: DC

## 2013-03-23 MED ORDER — HYDROCODONE-ACETAMINOPHEN 10-325 MG PO TABS: 1.0000 | ORAL_TABLET | ORAL | Status: DC | PRN

## 2013-03-23 MED ORDER — PROMETHAZINE HCL 25 MG PO TABS: 25.0000 mg | ORAL_TABLET | Freq: Four times a day (QID) | ORAL | Status: DC | PRN

## 2013-03-23 NOTE — Telephone Encounter (Signed)
EUS scheduled, pt instructed and medications reviewed.  Patient instructions mailed to home.  Patient to call with any questions or concerns.  

## 2013-03-23 NOTE — Telephone Encounter (Signed)
Message copied by Donata Duff on Wed Mar 23, 2013 10:48 AM ------      Message from: [place], PATTI E      Created: Wed Mar 23, 2013 10:37 AM      Regarding: FW: needs durect EUS                   ----- Message -----         From: Dianah Field, PA-C         Sent: 03/23/2013   9:50 AM           To: Rachael Fee, MD, Patti E [place], CMA, #      Subject: needs durect EUS                                         Hi      Mary [name] is going home today, 8/6 and will need setting up for direct EUS.  Exact timing of this can be dictated by Dr Christella Hartigan, but I would think no sooner than one month's time.            This is for eval of pancreatic cyst.  A problem she has had since at least 2002.  May have had a Roux -en Y.             Thanks, Maralyn Sago ------

## 2013-03-23 NOTE — Progress Notes (Signed)
NURSING PROGRESS NOTE  Mary [name] [id number] Discharge Data: 03/23/2013 2:34 PM Attending Provider: No att. providers found WUJ:[id number],WGNFA A, MD     [name] to be D/C'd Home per MD order.  Discussed with the patient the After Visit Summary and all questions fully answered. All IV's discontinued with no bleeding noted. All belongings returned to patient for patient to take home.   Last Vital Signs:  Blood pressure 88/58, pulse 58, temperature 98.3 F (36.8 C), temperature source Oral, resp. rate 16, height 5\' 4"  (1.626 m), weight 112 kg (246 lb 14.6 oz), last menstrual period 02/13/2013, SpO2 92.00%.  Discharge Medication List   Medication List         HYDROcodone-acetaminophen 10-325 MG per tablet  Commonly known as:  NORCO  Take 1 tablet by mouth every 4 (four) hours as needed.     ondansetron 4 MG disintegrating tablet  Commonly known as:  ZOFRAN-ODT  Take 1 tablet (4 mg total) by mouth every 8 (eight) hours as needed for nausea.     Pancrelipase (Lip-Prot-Amyl) 3000-9500 UNITS Cpep  Take 2 capsules by mouth 3 (three) times daily before meals.     pantoprazole 40 MG tablet  Commonly known as:  PROTONIX  Take 1 tablet (40 mg total) by mouth daily at 6 (six) AM.     polyethylene glycol packet  Commonly known as:  MIRALAX / GLYCOLAX  Take 17 g by mouth daily as needed. Take one heaping capful in 6 ounces of fluid daily when you are taking narcotic pain medication     promethazine 25 MG tablet  Commonly known as:  PHENERGAN  Take 1 tablet (25 mg total) by mouth every 6 (six) hours as needed for nausea.     senna-docusate 8.6-50 MG per tablet  Commonly known as:  Senokot-S  Take 2 tablets by mouth at bedtime.

## 2013-03-23 NOTE — Progress Notes (Signed)
     [facility] GI Daily Rounding Note 03/23/2013, 8:24 AM  SUBJECTIVE:       Used 4 mg total of Hydrocodone yesterday, down from 6 and 16 mg in previous 2 days.  Less abdominal pain.  Ready to go home.  Nausea hit or miss but tolerating solid diet.  No stool yet though got a dose of Miralax yesterday.   OBJECTIVE:         Vital signs in last 24 hours:    Temp:  [97.4 F (36.3 C)-98.3 F (36.8 C)] 98.3 F (36.8 C) (08/06 0554) Pulse Rate:  [58-85] 58 (08/06 0554) Resp:  [16-18] 16 (08/06 0554) BP: (88-119)/(58-82) 88/58 mmHg (08/06 0554) SpO2:  [92 %-97 %] 92 % (08/06 0554) Last BM Date: 03/18/13 General: obese, non-toxic.     Heart: RRR Chest: clear no dyspnea Abdomen: soft, obese, hypoactive BS.  Slightly tender in upper abdomen  Extremities: no CCE Neuro/Psych:  Pleasant, engaged, relaxed, no confusion.   Intake/Output from previous day: 08/05 0701 - 08/06 0700 In: 422 [P.O.:422] Out: -   Intake/Output this shift:    Lab Results:  Recent Labs  03/20/13 0846  WBC 6.9  HGB 11.9*  HCT 34.3*  PLT 364   BMET  Recent Labs  03/20/13 0846  NA 132*  K 3.9  CL 97  CO2 20  GLUCOSE 81  BUN 7  CREATININE 0.59  CALCIUM 9.0   LFT  Recent Labs  03/20/13 0846  PROT 7.4  ALBUMIN 3.1*  AST 35  ALT 19  ALKPHOS 62  BILITOT 0.3    ASSESMENT: * Acute recurrent abdominal pain with diarrhea. Hx recurrent pancreatitis and pancreatic cyst(s) dating back to at least 2002. For this admission, no signs of acute pancreatitis, lipase not elevated. CT without changes of acute or chronic pancreatitis.  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 Admission 03/15/13 with acute onset pain and diarrhea, normal lipase, and CT with 4.4 x 3.8 x 3 cm septated cyst in pancreatic body, ileitis. Diarrhea and leukocytosis promptly resolved.  Completed 7 days empiric Cipro and Flagyl on 8/4  The acute and lingering GI sxs are more c/w pancreatic cyst, than from ileitis. They are slowly  improving  * Hyponatremia.  * Morbid Obesity. BMI 42. 246#/112 kg.    PLAN: *  Mom's cell phone is best way to reach the pt. It is [phone] *  Dr Larae Grooms will be in contact with Mary [name] re: EUS to further evaluate the pancreatic cyst.  Does not need office visit with GI before this.  Hospitalist will be arranging a PMD for this pt *  May need Dulcolax, stimulant laxative for persistent constipation.    LOS: 8 days   Mary [name]  03/23/2013, 8:24 AM Pager: [phone]    Attending physician's note   I have taken an interval history, reviewed the chart and examined the patient. I agree with the Advanced Practitioner's note, impression and recommendations. Pt is markedly improved and is close to discharge. GI signing off.   Venita Lick. Russella Dar, MD [facility]

## 2013-03-23 NOTE — Discharge Summary (Signed)
Addendum  Patient seen and examined, chart and data base reviewed.  I agree with the above assessment and plan.  For full details please see Mrs. Algis Downs PA note.  Abdominal pain, terminal ileitis, also suspect acute on chronic pancreatitis.  Completed 7 days of Cipro/Flagyl during this hospital stay, followup with GI as outpatient.   Clint Lipps, MD [facility] Pager: [phone] 03/23/2013, 12:57 PM

## 2013-03-23 NOTE — Telephone Encounter (Signed)
Message copied by Donata Duff on Wed Mar 23, 2013 10:59 AM ------      Message from: Rob Bunting P      Created: Wed Mar 23, 2013 10:57 AM      Regarding: RE: needs durect EUS       [name],      She needs upper EUS, radial +/- linear, 60 min, ++ MAC, for pancreatic cyst, 3-4 weeks from now.              Thanks            ----- Message -----         From: Patti E [place], CMA         Sent: 03/23/2013  10:37 AM           To: Rachael Fee, MD, Donata Duff, CMA      Subject: Annell Greening: needs durect EUS                                                 ----- Message -----         From: Dianah Field, PA-C         Sent: 03/23/2013   9:50 AM           To: Rachael Fee, MD, Patti E [place], CMA, #      Subject: needs durect EUS                                         Hi      Mary [name] is going home today, 8/6 and will need setting up for direct EUS.  Exact timing of this can be dictated by Dr Christella Hartigan, but I would think no sooner than one month's time.            This is for eval of pancreatic cyst.  A problem she has had since at least 2002.  May have had a Roux -en Y.             Thanks, Maralyn Sago       ------

## 2013-03-23 NOTE — Discharge Summary (Signed)
Physician Discharge Summary  Mary [name] JXB:[id number] DOB: 01/30/1976 DOA: 03/15/2013  PCP: Dorrene German, MD  Admit date: 03/15/2013 Discharge date: 03/23/2013  Time spent: 45 minutes  Recommendations for Outpatient Follow-up:  PCP follow up for Chronic abdominal pain, GERD, low albumin, constipation and to establish care Check BMET for sodium land potassium levels at hospital follow up. Will follow up with [facility] in 2-3 weeks for evaluation of pseudocyst with EUS  Discharge Diagnoses:  Principal Problem:   Terminal ileitis Active Problems:   Obesity, morbid   Pancreatic cyst   history of recurrent ideopathic pancreatitis   Hypokalemia   Discharge Condition: stable, tolerating solid food  Diet recommendation: low fat  Filed Weights   03/15/13 0932  Weight: 112 kg (246 lb 14.6 oz)    HPI at the time of admission:  Mary [name] is a 37 y.o. female who presents to the emergency room with severe abdominal pain. She recently moved back to [place] from [place] and began having pain on the plane. She's had vomiting and diarrhea on and off for several days. Subjective fevers and chills. She has a history of recurrent idiopathic pancreatitis and thought this might be a flare. She has a history of a chronic pancreatic cyst and apparently had a procedure done at [facility] "that connects the intestine to the cyst" no records are available. She has had no sick contacts. She is a no suspect foods. Other than [place], in the [employer] area, she has had no travel. In the emergency room, her workup was significant for a CAT scan that showed the known pancreatic cyst, also, terminal ileitis. Patient has no previous history of ileitis or colitis that she knows of. No history of inflammatory bowel disease. She denies alcohol use whatsoever. She takes no chronic pain medications. She has received multiple doses of fentanyl, and still is having quite a bit of pain.   Hospital  Course:  Abdominal Pain  - Slow clinical improvement over the course of the hospitalization.  Diet advanced to soft solids 8/5. -suspect this is more from acute on chronic pancreatitis, rather than just ileitis.  - Received Cipro/Flagy 7/29 - 8/4  -She was maintained on IV dilaudid and vicodin in house.  She will be discharged on vicodin, phenergan, protonix. -May need pain management clinic at some point in the future.  Hyponatremia  -Suspect from dehydration.   -She received IVF and improved.  However, her sodium levels remained slightly low. -Na levels steady   Chronic idiopathic pancreatitis/chronic pancreatic cyst  - On CT of the abdomen-cyst on the pancreas unchanged from 2002  - medical records from [place] reviewed.   Hypokalemia  -Repleted  Obesity, morbid  -Counseled regarding importance of weight loss   Low albumin  -secondary to chronic illness  -Resource breeze supplements    Consultations:  [facility]  Discharge Exam: Filed Vitals:   03/23/13 0554  BP: 88/58  Pulse: 58  Temp: 98.3 F (36.8 C)  Resp: 16   Gen Exam: Awake and alert with clear speech. Thinning hair. Appropriate. Neck: Supple, No JVD.  Chest: B/L Clear. No w/c/r  CVS: S1 S2 Regular, no murmurs.  Abdomen: soft, BS +, obese, non distended, non tender. No rebound or rigidity.  Extremities: no edema, lower extremities warm to touch. Able to ambulate  Neurologic: Non Focal.  Skin: No Rash.    Discharge Instructions      Discharge Orders   Future Orders Complete By Expires  Diet - low sodium heart healthy  As directed     Increase activity slowly  As directed         Medication List         HYDROcodone-acetaminophen 10-325 MG per tablet  Commonly known as:  NORCO  Take 1 tablet by mouth every 4 (four) hours as needed.     ondansetron 4 MG disintegrating tablet  Commonly known as:  ZOFRAN-ODT  Take 1 tablet (4 mg total) by mouth every 8 (eight) hours as needed for  nausea.     Pancrelipase (Lip-Prot-Amyl) 3000-9500 UNITS Cpep  Take 2 capsules by mouth 3 (three) times daily before meals.     pantoprazole 40 MG tablet  Commonly known as:  PROTONIX  Take 1 tablet (40 mg total) by mouth daily at 6 (six) AM.     polyethylene glycol packet  Commonly known as:  MIRALAX / GLYCOLAX  Take 17 g by mouth daily as needed. Take one heaping capful in 6 ounces of fluid daily when you are taking narcotic pain medication     promethazine 25 MG tablet  Commonly known as:  PHENERGAN  Take 1 tablet (25 mg total) by mouth every 6 (six) hours as needed for nausea.     senna-docusate 8.6-50 MG per tablet  Commonly known as:  Senokot-S  Take 2 tablets by mouth at bedtime.       Allergies  Allergen Reactions  . Penicillins Anaphylaxis  . Latex Itching  . Morphine And Related Nausea And Vomiting   Follow-up Information   Follow up with Dorrene German, MD On 04/12/2013. (3:30, must bring $135.00 and pay at time of apt)    Contact information:   [address] [place] [address] [phone]       Follow up with Rob Bunting, MD. Schedule an appointment as soon as possible for a visit in 3 weeks. (Office should call you with an appointment for Endoscopic Ultrasound of Pancreatic pseudocyst.)    Contact information:   [address] [place] [address] [phone]        The results of significant diagnostics from this hospitalization (including imaging, microbiology, ancillary and laboratory) are listed below for reference.    Significant Diagnostic Studies: Ct Abdomen Pelvis W Contrast  03/15/2013   *RADIOLOGY REPORT*  Clinical Data: Nausea, diarrhea, and fever.  CT ABDOMEN AND PELVIS WITH CONTRAST  Technique:  Multidetector CT imaging of the abdomen and pelvis was performed following the standard protocol during bolus administration of intravenous contrast.  Contrast:  100 ml Omnipaque-300.  Comparison: No previous imaging available for  comparison. Abdominal MRI report 02/23/2001.  Findings:  BODY WALL: There are three upper abdominal midline herniations containing fat and omental vessel.  LOWER CHEST:  Mediastinum:  9 mm right lower.  Esophageal lymph node. Neighboring esophagus is not dilated or thickened.  Lungs/pleura: No consolidation.  ABDOMEN/PELVIS:  Liver: Diffuse low attenuation.  No focal abnormality.  Biliary: Cholecystectomy.  No biliary dilatation.  Pancreas: There is a septated cystic mass arising from the pancreatic body, with three macroscopic cysts.  In aggregate, the lesion measures 4.4 x 3.85 x 3 cm.  No enhancing nodule identified. No evidence of acute or previous pancreatitis. No main duct dilatation. No pancreatic dilatation.  Spleen: Unremarkable.  Adrenals: 17 mm nodule in the left adrenal body.  Kidneys and ureters: No hydronephrosis or stone.  Bladder: Unremarkable.  Bowel: No obstruction. There is thickening of the terminal ileal wall circumferentially by submucosal  edema.  No other segmental of bowel inflammation identified.  There has been previous small bowel surgery with anastomosis in the left abdomen. No evidence of creeping fat or fistulization. Normal appendix.  Retroperitoneum: Ileocolic lymphadenopathy, presumed reactive to the above. Enlargement of left external iliac chain lymph node, 20 x 10 mm on image 82.  In the absence of additional unexplained intra-abdominal adenopathy, this is presumably reactive.  Peritoneum: No free fluid or gas.  Reproductive: Unremarkable.  Vascular: No acute abnormality.  OSSEOUS: No acute abnormalities. No suspicious lytic or blastic lesions.  IMPRESSION:  1.  Terminal ileitis which could be infectious or inflammatory.  No obstruction. 2.  4.3 cm macrocystic pancreatic mass, present since at least 2002. 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 3.  17 mm left adrenal nodule, likely adenoma.  4. Three ventral abdominal  wall hernias containing omentum.   Original Report Authenticated By: Tiburcio Pea   Dg Abd 2 Views  03/20/2013   *RADIOLOGY REPORT*  Clinical Data: Small bowel obstruction, abdominal pain.  ABDOMEN - 2 VIEW  Comparison: 03/15/2013 CT.  Findings: Prior cholecystectomy. There is normal bowel gas pattern. No free air.  No organomegaly or suspicious calcification.  No acute bony abnormality.  IMPRESSION: No evidence of obstruction or free air.   Original Report Authenticated By: Charlett Nose, M.D.    Microbiology: Recent Results (from the past 240 hour(s))  URINE CULTURE     Status: None   Collection Time    03/15/13  3:49 AM      Result Value Range Status   Specimen Description URINE, CLEAN CATCH   Final   Special Requests NONE   Final   Culture  Setup Time 03/15/2013 10:59   Final   Colony Count NO GROWTH   Final   Culture NO GROWTH   Final   Report Status 03/16/2013 FINAL   Final     Labs: Basic Metabolic Panel:  Recent Labs Lab 03/17/13 0615 03/18/13 0520 03/19/13 0600 03/20/13 0846  NA 134* 137 134* 132*  K 4.8 4.6 4.6 3.9  CL 102 105 99 97  CO2 20 19 18* 20  GLUCOSE 105* 76 101* 81  BUN 5* 6 7 7   CREATININE 0.76 0.61 0.59 0.59  CALCIUM 8.7 8.9 9.0 9.0   Liver Function Tests:  Recent Labs Lab 03/20/13 0846  AST 35  ALT 19  ALKPHOS 62  BILITOT 0.3  PROT 7.4  ALBUMIN 3.1*    Recent Labs Lab 03/19/13 0600  LIPASE 21   CBC:  Recent Labs Lab 03/17/13 0615 03/18/13 0520 03/20/13 0846  WBC 7.7 7.2 6.9  HGB 10.7* 11.5* 11.9*  HCT 31.8* 33.1* 34.3*  MCV 86.4 86.2 85.3  PLT 280 309 364   CBG:  Recent Labs Lab 03/17/13 0456  GLUCAP 79   SignedConley Canal [phone] [facility] 03/23/2013, 11:58 AM

## 2013-03-24 ENCOUNTER — Encounter (HOSPITAL_COMMUNITY): Payer: Self-pay | Admitting: *Deleted

## 2013-03-24 DIAGNOSIS — K859 Acute pancreatitis without necrosis or infection, unspecified: Secondary | ICD-10-CM

## 2013-03-24 DIAGNOSIS — K469 Unspecified abdominal hernia without obstruction or gangrene: Secondary | ICD-10-CM

## 2013-03-24 HISTORY — DX: Acute pancreatitis without necrosis or infection, unspecified: K85.90

## 2013-03-24 HISTORY — DX: Unspecified abdominal hernia without obstruction or gangrene: K46.9

## 2013-03-28 ENCOUNTER — Encounter (HOSPITAL_COMMUNITY): Payer: Self-pay | Admitting: Pharmacy Technician

## 2013-04-14 ENCOUNTER — Encounter (HOSPITAL_COMMUNITY): Payer: Self-pay | Admitting: Emergency Medicine

## 2013-04-14 ENCOUNTER — Encounter (HOSPITAL_COMMUNITY): Payer: Self-pay | Admitting: Anesthesiology

## 2013-04-14 ENCOUNTER — Encounter (HOSPITAL_COMMUNITY)
Admission: RE | Disposition: A | Discharge status: Home / Self Care | Payer: Self-pay | Source: Ambulatory Visit | Attending: Gastroenterology

## 2013-04-14 ENCOUNTER — Ambulatory Visit (HOSPITAL_COMMUNITY)
Admission: RE | Admit: 2013-04-14 | Discharge: 2013-04-14 | Disposition: A | Discharge status: Home / Self Care | Payer: MEDICAID | Source: Ambulatory Visit | Attending: Gastroenterology | Admitting: Gastroenterology

## 2013-04-14 ENCOUNTER — Emergency Department (HOSPITAL_COMMUNITY): Payer: Self-pay

## 2013-04-14 ENCOUNTER — Ambulatory Visit (HOSPITAL_COMMUNITY): Payer: Self-pay | Admitting: Anesthesiology

## 2013-04-14 ENCOUNTER — Encounter (HOSPITAL_COMMUNITY): Payer: Self-pay | Admitting: *Deleted

## 2013-04-14 ENCOUNTER — Inpatient Hospital Stay (HOSPITAL_COMMUNITY)
Admission: EM | Admit: 2013-04-14 | Discharge: 2013-04-28 | DRG: 438 | Disposition: A | Discharge status: Home / Self Care | Payer: Self-pay | Attending: Internal Medicine | Admitting: Internal Medicine

## 2013-04-14 DIAGNOSIS — K439 Ventral hernia without obstruction or gangrene: Secondary | ICD-10-CM | POA: Diagnosis present

## 2013-04-14 DIAGNOSIS — T4275XA Adverse effect of unspecified antiepileptic and sedative-hypnotic drugs, initial encounter: Secondary | ICD-10-CM | POA: Diagnosis present

## 2013-04-14 DIAGNOSIS — F17200 Nicotine dependence, unspecified, uncomplicated: Secondary | ICD-10-CM | POA: Diagnosis present

## 2013-04-14 DIAGNOSIS — Z6841 Body Mass Index (BMI) 40.0 and over, adult: Secondary | ICD-10-CM

## 2013-04-14 DIAGNOSIS — G0490 Encephalitis and encephalomyelitis, unspecified: Secondary | ICD-10-CM | POA: Diagnosis present

## 2013-04-14 DIAGNOSIS — E43 Unspecified severe protein-calorie malnutrition: Secondary | ICD-10-CM | POA: Diagnosis present

## 2013-04-14 DIAGNOSIS — K5900 Constipation, unspecified: Secondary | ICD-10-CM | POA: Diagnosis present

## 2013-04-14 DIAGNOSIS — R1115 Cyclical vomiting syndrome unrelated to migraine: Secondary | ICD-10-CM | POA: Diagnosis present

## 2013-04-14 DIAGNOSIS — K862 Cyst of pancreas: Secondary | ICD-10-CM | POA: Diagnosis present

## 2013-04-14 DIAGNOSIS — K859 Acute pancreatitis without necrosis or infection, unspecified: Principal | ICD-10-CM | POA: Diagnosis present

## 2013-04-14 DIAGNOSIS — K861 Other chronic pancreatitis: Secondary | ICD-10-CM | POA: Diagnosis present

## 2013-04-14 DIAGNOSIS — E2740 Unspecified adrenocortical insufficiency: Secondary | ICD-10-CM | POA: Diagnosis present

## 2013-04-14 DIAGNOSIS — F329 Major depressive disorder, single episode, unspecified: Secondary | ICD-10-CM | POA: Diagnosis present

## 2013-04-14 DIAGNOSIS — D638 Anemia in other chronic diseases classified elsewhere: Secondary | ICD-10-CM | POA: Diagnosis present

## 2013-04-14 DIAGNOSIS — K7689 Other specified diseases of liver: Secondary | ICD-10-CM | POA: Diagnosis present

## 2013-04-14 DIAGNOSIS — K219 Gastro-esophageal reflux disease without esophagitis: Secondary | ICD-10-CM | POA: Diagnosis present

## 2013-04-14 DIAGNOSIS — D649 Anemia, unspecified: Secondary | ICD-10-CM

## 2013-04-14 DIAGNOSIS — K5289 Other specified noninfective gastroenteritis and colitis: Secondary | ICD-10-CM | POA: Insufficient documentation

## 2013-04-14 DIAGNOSIS — Z79899 Other long term (current) drug therapy: Secondary | ICD-10-CM

## 2013-04-14 DIAGNOSIS — R748 Abnormal levels of other serum enzymes: Secondary | ICD-10-CM | POA: Diagnosis present

## 2013-04-14 DIAGNOSIS — Z9089 Acquired absence of other organs: Secondary | ICD-10-CM | POA: Insufficient documentation

## 2013-04-14 DIAGNOSIS — F3289 Other specified depressive episodes: Secondary | ICD-10-CM | POA: Diagnosis present

## 2013-04-14 DIAGNOSIS — R9389 Abnormal findings on diagnostic imaging of other specified body structures: Secondary | ICD-10-CM

## 2013-04-14 DIAGNOSIS — L659 Nonscarring hair loss, unspecified: Secondary | ICD-10-CM | POA: Diagnosis present

## 2013-04-14 DIAGNOSIS — E2749 Other adrenocortical insufficiency: Secondary | ICD-10-CM | POA: Diagnosis present

## 2013-04-14 DIAGNOSIS — F411 Generalized anxiety disorder: Secondary | ICD-10-CM | POA: Diagnosis present

## 2013-04-14 DIAGNOSIS — K5000 Crohn's disease of small intestine without complications: Secondary | ICD-10-CM

## 2013-04-14 DIAGNOSIS — R197 Diarrhea, unspecified: Secondary | ICD-10-CM | POA: Diagnosis present

## 2013-04-14 DIAGNOSIS — E278 Other specified disorders of adrenal gland: Secondary | ICD-10-CM | POA: Diagnosis present

## 2013-04-14 HISTORY — DX: Major depressive disorder, single episode, unspecified: F32.9

## 2013-04-14 HISTORY — DX: Depression, unspecified: F32.A

## 2013-04-14 HISTORY — DX: Anxiety disorder, unspecified: F41.9

## 2013-04-14 HISTORY — DX: Unspecified adrenocortical insufficiency: E27.40

## 2013-04-14 HISTORY — PX: EUS: SHX5427

## 2013-04-14 LAB — CBC WITH DIFFERENTIAL/PLATELET
Basophils Absolute: 0.1 10*3/uL (ref 0.0–0.1)
Basophils Relative: 1 % (ref 0–1)
Eosinophils Absolute: 0.6 10*3/uL (ref 0.0–0.7)
Eosinophils Relative: 6 % — ABNORMAL HIGH (ref 0–5)
Lymphocytes Relative: 26 % (ref 12–46)
MCHC: 32.7 g/dL (ref 30.0–36.0)
MCV: 88.2 fL (ref 78.0–100.0)
Monocytes Absolute: 0.7 10*3/uL (ref 0.1–1.0)
Platelets: 267 10*3/uL (ref 150–400)
RDW: 14.9 % (ref 11.5–15.5)
WBC: 10.4 10*3/uL (ref 4.0–10.5)

## 2013-04-14 LAB — COMPREHENSIVE METABOLIC PANEL
Alkaline Phosphatase: 78 U/L (ref 39–117)
BUN: 4 mg/dL — ABNORMAL LOW (ref 6–23)
Chloride: 107 mEq/L (ref 96–112)
GFR calc Af Amer: >90 mL/min (ref >90)
GFR calc non Af Amer: >90 mL/min (ref >90)
Glucose, Bld: 104 mg/dL — ABNORMAL HIGH (ref 70–99)
Sodium: 137 mEq/L (ref 135–145)
Total Bilirubin: 0.1 mg/dL — ABNORMAL LOW (ref 0.3–1.2)

## 2013-04-14 LAB — LIPASE, BLOOD: Lipase: 638 U/L — ABNORMAL HIGH (ref 11–59)

## 2013-04-14 SURGERY — UPPER ENDOSCOPIC ULTRASOUND (EUS) LINEAR
Anesthesia: Monitor Anesthesia Care

## 2013-04-14 MED ORDER — HYDROMORPHONE HCL PF 1 MG/ML IJ SOLN
1.0000 mg | Freq: Once | INTRAMUSCULAR | Status: AC
  Administered 2013-04-14: 1 mg via INTRAVENOUS
  Filled 2013-04-14: qty 1

## 2013-04-14 MED ORDER — DIPHENHYDRAMINE HCL 50 MG/ML IJ SOLN
25.0000 mg | Freq: Once | INTRAMUSCULAR | Status: AC
  Administered 2013-04-14: 25 mg via INTRAVENOUS
  Filled 2013-04-14: qty 1

## 2013-04-14 MED ORDER — ONDANSETRON HCL 4 MG PO TABS
4.0000 mg | ORAL_TABLET | Freq: Four times a day (QID) | ORAL | Status: DC | PRN
  Filled 2013-04-14: qty 1

## 2013-04-14 MED ORDER — ENOXAPARIN SODIUM 40 MG/0.4ML ~~LOC~~ SOLN
40.0000 mg | SUBCUTANEOUS | Status: DC
  Filled 2013-04-14 (×9): qty 0.4

## 2013-04-14 MED ORDER — ONDANSETRON HCL 4 MG/2ML IJ SOLN
4.0000 mg | Freq: Once | INTRAMUSCULAR | Status: AC
  Administered 2013-04-14: 4 mg via INTRAVENOUS
  Filled 2013-04-14: qty 2

## 2013-04-14 MED ORDER — SODIUM CHLORIDE 0.9 % IV SOLN: INTRAVENOUS | Status: DC

## 2013-04-14 MED ORDER — ACETAMINOPHEN 325 MG PO TABS
650.0000 mg | ORAL_TABLET | Freq: Four times a day (QID) | ORAL | Status: DC | PRN
  Administered 2013-04-28: 650 mg via ORAL
  Filled 2013-04-14: qty 2

## 2013-04-14 MED ORDER — PROMETHAZINE HCL 25 MG PO TABS: 25.0000 mg | ORAL_TABLET | Freq: Four times a day (QID) | ORAL | Status: DC | PRN

## 2013-04-14 MED ORDER — CIPROFLOXACIN HCL 500 MG PO TABS: 500.0000 mg | ORAL_TABLET | Freq: Two times a day (BID) | ORAL | Status: DC

## 2013-04-14 MED ORDER — HYDROMORPHONE HCL PF 1 MG/ML IJ SOLN
1.0000 mg | INTRAMUSCULAR | Status: DC | PRN
  Administered 2013-04-14: 1 mg via INTRAVENOUS
  Administered 2013-04-15 (×2): 2 mg via INTRAVENOUS
  Filled 2013-04-14 (×2): qty 1
  Filled 2013-04-14 (×2): qty 2

## 2013-04-14 MED ORDER — BUTAMBEN-TETRACAINE-BENZOCAINE 2-2-14 % EX AERO
INHALATION_SPRAY | CUTANEOUS | Status: DC | PRN
  Administered 2013-04-14: 2 via TOPICAL

## 2013-04-14 MED ORDER — KCL IN DEXTROSE-NACL 20-5-0.45 MEQ/L-%-% IV SOLN
INTRAVENOUS | Status: DC
  Administered 2013-04-14 – 2013-04-16 (×4): via INTRAVENOUS
  Filled 2013-04-14 (×5): qty 1000

## 2013-04-14 MED ORDER — BISACODYL 10 MG RE SUPP: 10.0000 mg | Freq: Every day | RECTAL | Status: DC | PRN

## 2013-04-14 MED ORDER — MIDAZOLAM HCL 5 MG/5ML IJ SOLN
INTRAMUSCULAR | Status: DC | PRN
  Administered 2013-04-14: 1 mg via INTRAVENOUS
  Administered 2013-04-14: 2 mg via INTRAVENOUS

## 2013-04-14 MED ORDER — CIPROFLOXACIN IN D5W 400 MG/200ML IV SOLN
INTRAVENOUS | Status: DC | PRN
  Administered 2013-04-14: 400 mg via INTRAVENOUS

## 2013-04-14 MED ORDER — ONDANSETRON HCL 4 MG/2ML IJ SOLN
4.0000 mg | Freq: Four times a day (QID) | INTRAMUSCULAR | Status: DC | PRN
  Administered 2013-04-14 – 2013-04-24 (×18): 4 mg via INTRAVENOUS
  Filled 2013-04-14 (×19): qty 2

## 2013-04-14 MED ORDER — DEXTROSE-NACL 5-0.45 % IV SOLN: INTRAVENOUS | Status: AC

## 2013-04-14 MED ORDER — PANTOPRAZOLE SODIUM 40 MG IV SOLR
40.0000 mg | Freq: Every day | INTRAVENOUS | Status: DC
  Administered 2013-04-14 – 2013-04-27 (×14): 40 mg via INTRAVENOUS
  Filled 2013-04-14 (×14): qty 40

## 2013-04-14 MED ORDER — SODIUM CHLORIDE 0.9 % IV BOLUS (SEPSIS)
500.0000 mL | Freq: Once | INTRAVENOUS | Status: AC
  Administered 2013-04-14: 500 mL via INTRAVENOUS

## 2013-04-14 MED ORDER — PROPOFOL 10 MG/ML IV BOLUS
INTRAVENOUS | Status: DC | PRN
  Administered 2013-04-14: 40 mg via INTRAVENOUS
  Administered 2013-04-14: 20 mg via INTRAVENOUS
  Administered 2013-04-14: 40 mg via INTRAVENOUS
  Administered 2013-04-14: 20 mg via INTRAVENOUS
  Administered 2013-04-14: 40 mg via INTRAVENOUS

## 2013-04-14 MED ORDER — DIPHENHYDRAMINE HCL 25 MG PO CAPS: 25.0000 mg | ORAL_CAPSULE | Freq: Four times a day (QID) | ORAL | Status: DC | PRN

## 2013-04-14 MED ORDER — ACETAMINOPHEN 650 MG RE SUPP: 650.0000 mg | Freq: Four times a day (QID) | RECTAL | Status: DC | PRN

## 2013-04-14 MED ORDER — LACTATED RINGERS IV SOLN
INTRAVENOUS | Status: DC | PRN
  Administered 2013-04-14: 09:00:00 via INTRAVENOUS

## 2013-04-14 MED ORDER — FENTANYL CITRATE 0.05 MG/ML IJ SOLN
INTRAMUSCULAR | Status: DC | PRN
  Administered 2013-04-14 (×3): 50 ug via INTRAVENOUS

## 2013-04-14 MED ORDER — CIPROFLOXACIN IN D5W 400 MG/200ML IV SOLN
400.0000 mg | Freq: Two times a day (BID) | INTRAVENOUS | Status: AC
  Administered 2013-04-14 – 2013-04-17 (×6): 400 mg via INTRAVENOUS
  Filled 2013-04-14 (×6): qty 200

## 2013-04-14 MED ORDER — KETAMINE HCL 10 MG/ML IJ SOLN
INTRAMUSCULAR | Status: DC | PRN
  Administered 2013-04-14: 10 mg via INTRAVENOUS

## 2013-04-14 MED ORDER — CIPROFLOXACIN IN D5W 400 MG/200ML IV SOLN
INTRAVENOUS | Status: AC
  Filled 2013-04-14: qty 200

## 2013-04-14 NOTE — Op Note (Signed)
[facility] [address] [place], [address]   ENDOSCOPIC ULTRASOUND PROCEDURE REPORT PATIENT: Mary [name], Mary [name]  MR#: [id number] BIRTHDATE: 1975-12-01  GENDER: Female ENDOSCOPIST: Rachael Fee, MD REFERRED BY:  Roxy Cedar, M.D. PROCEDURE DATE:  04/14/2013 PROCEDURE:   Upper EUS w/FNA ASA CLASS:      Class II INDICATIONS:   chronic tail of pancreas cyst, noted in 2002 (4cm) by CT here in [place]; therapies in [place] (records requested but never recieved), per patient included stent, eventual surgery to "connect cyst and small intestine." Has moved back to [place], recently abdmitted with probable infectious enteritis, seen in consult by Dr.  Marina Goodell, the cyst was again noted. MEDICATIONS: Cipro 400 mg IV and MAC sedation, administered by CRNA  DESCRIPTION OF PROCEDURE:   After the risks benefits and alternatives of the procedure were  explained, informed consent was obtained. The patient was then placed in the left, lateral, decubitus postion and IV sedation was administered. Throughout the procedure, the patients blood pressure, pulse and oxygen saturations were monitored continuously.  Under direct visualization, the Pentax Radial EUS [id number]  endoscope was introduced through the mouth  and advanced to the second portion of the duodenum .  Water was used as necessary to provide an acoustic interface.  Upon completion of the imaging, water was removed and the patient was sent to the recovery room in satisfactory condition.  Endoscopic findings: 1. Normal UGI tract EUS findings: 1. Multicystic lesion in tail of pancreas that measures 4.8cm maximally. There are multiple thin septations within the lesion. There are no solid nodules, no associated solid masses. The main pancreatic duct is near the cyst, unclear on this exam if it actually communicates with the cyst however.  The cyst was nearly completely aspirated with a single pass of a 22 guage EUS  FNA needle. This yielded 16cc of clear, somewhat thick fluid which was sent for CEA, amylase, cytology testing. 2. Main pancreatic duct is normal; non-dilated 3. Pancreatic parenchyma was normal except for the cyst described above. 4. No peripancreatic or celiac adenopathy 5. CBD was normal, non-dilated 6. Gallbladder surgically absent 7. Limited views of liver, spleen, portal and splenic vessels were all normal Impression: Chronic cystic lesion in tail of pancreas (present at least since 2002) which as grown slightly since first imaging available here in [place] (2002).  She has undergone therapies for this cyst in [place] and despite requesting records  they have never been received. Cyst fluid today was aspirated, sent to fluid testing. She will complete 3 days of cipro twice daily.   _______________________________ eSignedRachael Fee, MD 04/14/2013 10:43 AM

## 2013-04-14 NOTE — Interval H&P Note (Signed)
History and Physical Interval Note:  04/14/2013 9:28 AM  Mary [name]  has presented today for surgery, with the diagnosis of Pancreas cyst [577.2]  The various methods of treatment have been discussed with the patient and family. After consideration of risks, benefits and other options for treatment, the patient has consented to  Procedure(s): UPPER ENDOSCOPIC ULTRASOUND (EUS) LINEAR (N/A) as a surgical intervention .  The patient's history has been reviewed, patient examined, no change in status, stable for surgery.  I have reviewed the patient's chart and labs.  Questions were answered to the patient's satisfaction.     Rob Bunting

## 2013-04-14 NOTE — Preoperative (Signed)
Beta Blockers   Reason not to administer Beta Blockers:Not Applicable 

## 2013-04-14 NOTE — Anesthesia Preprocedure Evaluation (Addendum)
Anesthesia Evaluation  Patient identified by MRN, date of birth, ID band Patient awake    Reviewed: Allergy & Precautions, H&P , NPO status , Patient's Chart, lab work & pertinent test results  Airway Mallampati: II TM Distance: >3 FB Neck ROM: Full    Dental  (+) Dental Advisory Given   Pulmonary Current Smoker,  breath sounds clear to auscultation- rhonchi        Cardiovascular negative cardio ROS  Rhythm:Regular Rate:Normal     Neuro/Psych PSYCHIATRIC DISORDERS Anxiety Depression negative neurological ROS     GI/Hepatic Neg liver ROS, GERD-  Medicated,  Endo/Other  Morbid obesity  Renal/GU negative Renal ROS     Musculoskeletal negative musculoskeletal ROS (+)   Abdominal (+) + obese,   Peds  Hematology negative hematology ROS (+)   Anesthesia Other Findings   Reproductive/Obstetrics negative OB ROS                          Anesthesia Physical Anesthesia Plan  ASA: III  Anesthesia Plan: MAC   Post-op Pain Management:    Induction: Intravenous  Airway Management Planned:   Additional Equipment:   Intra-op Plan:   Post-operative Plan:   Informed Consent: I have reviewed the patients History and Physical, chart, labs and discussed the procedure including the risks, benefits and alternatives for the proposed anesthesia with the patient or authorized representative who has indicated his/her understanding and acceptance.   Dental advisory given  Plan Discussed with: CRNA  Anesthesia Plan Comments:         Anesthesia Quick Evaluation

## 2013-04-14 NOTE — ED Notes (Signed)
Pt had a needle byopsy this am with md jacobs and has pain to upper abd. Nausea no emesis

## 2013-04-14 NOTE — ED Provider Notes (Signed)
CSN: [id number]     Arrival date & time 04/14/13  1704 History   First MD Initiated Contact with Patient 04/14/13 1801     Chief Complaint  Patient presents with  . Abdominal Pain  . Nausea   (Consider location/radiation/quality/duration/timing/severity/associated sxs/prior Treatment) Patient is a 37 y.o. female presenting with abdominal pain. The history is provided by the patient.  Abdominal Pain Associated symptoms: nausea and vomiting   Associated symptoms: no chest pain, no diarrhea and no shortness of breath    patient had a pancreatic cyst drained today by Dr. Christella Hartigan. Since then she's developed upper abdominal pain like her previous pancreatitis. She's had previous cysts and reportedly had one draining into her bowel after surgery. No fevers. She states she's been vomiting. No diarrhea. No blood in the emesis.  Past Medical History  Diagnosis Date  . Hernia 03-24-13    ventral hernia remains   . GERD (gastroesophageal reflux disease)   . Pancreatitis 03-24-13    2002, 2'2013, 03-14-13  . Pancreatic cyst 2002 onset  . Anxiety   . Depression     "recent breakup with partner of 15 yrs"   Past Surgical History  Procedure Laterality Date  . Abdominal surgery  ~ 2007    some sort of pancreatic cyst drainage.   . Cholecystectomy      ~ age 89-laparoscopic  . Adenoidectomy    . Abdominal surgery      stent to pancreatic cyst that became infected within 36 hours   Family History  Problem Relation Age of Onset  . Lupus Neg Hx   . Sarcoidosis Neg Hx   . Pancreatitis Neg Hx   . Colitis Maternal Aunt   . Diabetes Mother   . Diabetes Father   . Diabetes      many family members   History  Substance Use Topics  . Smoking status: Current Every Day Smoker -- 0.50 packs/day for 20 years    Types: Cigarettes  . Smokeless tobacco: Never Used  . Alcohol Use: No   OB History   Grav Para Term Preterm Abortions TAB SAB Ect Mult Living                 Review of Systems   Constitutional: Negative for activity change and appetite change.  HENT: Negative for neck stiffness.   Eyes: Negative for pain.  Respiratory: Negative for chest tightness and shortness of breath.   Cardiovascular: Negative for chest pain and leg swelling.  Gastrointestinal: Positive for nausea, vomiting and abdominal pain. Negative for diarrhea.  Genitourinary: Negative for flank pain.  Musculoskeletal: Negative for back pain.  Skin: Negative for rash.  Neurological: Negative for weakness, numbness and headaches.  Psychiatric/Behavioral: Negative for behavioral problems.    Allergies  Penicillins; Latex; and Morphine and related  Home Medications   Current Outpatient Rx  Name  Route  Sig  Dispense  Refill  . HYDROcodone-acetaminophen (NORCO) 10-325 MG per tablet   Oral   Take 1 tablet by mouth every 4 (four) hours as needed.   45 tablet   0   . ondansetron (ZOFRAN-ODT) 4 MG disintegrating tablet   Oral   Take 1 tablet (4 mg total) by mouth every 8 (eight) hours as needed for nausea.   40 tablet   0   . Pancrelipase, Lip-Prot-Amyl, 3000-9500 UNITS CPEP   Oral   Take 2 capsules by mouth 3 (three) times daily before meals.   200 capsule   3   .  pantoprazole (PROTONIX) 40 MG tablet   Oral   Take 1 tablet (40 mg total) by mouth daily at 6 (six) AM.   30 tablet   12   . promethazine (PHENERGAN) 25 MG tablet   Oral   Take 1 tablet (25 mg total) by mouth every 6 (six) hours as needed for nausea.   30 tablet   0   . ciprofloxacin (CIPRO) 500 MG tablet   Oral   Take 1 tablet (500 mg total) by mouth 2 (two) times daily.   6 tablet   0    BP 111/73  Pulse 89  Temp(Src) 98 F (36.7 C) (Oral)  Resp 16  SpO2 97%  LMP 02/13/2013 Physical Exam  Nursing note and vitals reviewed. Constitutional: She is oriented to person, place, and time. She appears well-developed and well-nourished.  HENT:  Head: Normocephalic and atraumatic.  Eyes: EOM are normal. Pupils are  equal, round, and reactive to light.  Neck: Normal range of motion. Neck supple.  Cardiovascular: Normal rate, regular rhythm and normal heart sounds.   No murmur heard. Pulmonary/Chest: Effort normal and breath sounds normal. No respiratory distress. She has no wheezes. She has no rales.  Abdominal: Soft. Bowel sounds are normal. She exhibits no distension. There is tenderness. There is no rebound and no guarding.  Tenderness in epigastric and left upper abdomen. No ecchymosis. No rebound. Voluntary guarding.  Musculoskeletal: Normal range of motion.  Neurological: She is alert and oriented to person, place, and time. No cranial nerve deficit.  Skin: Skin is warm and dry.  Psychiatric: She has a normal mood and affect. Her speech is normal.    ED Course  Procedures (including critical care time) Labs Review Labs Reviewed  CBC WITH DIFFERENTIAL - Abnormal; Notable for the following:    Eosinophils Relative 6 (*)    All other components within normal limits  COMPREHENSIVE METABOLIC PANEL - Abnormal; Notable for the following:    Glucose, Bld 104 (*)    BUN 4 (*)    Creatinine, Ser 0.45 (*)    Albumin 3.2 (*)    Total Bilirubin 0.1 (*)    All other components within normal limits  LIPASE, BLOOD - Abnormal; Notable for the following:    Lipase 638 (*)    All other components within normal limits  AMYLASE - Abnormal; Notable for the following:    Amylase 150 (*)    All other components within normal limits   Imaging Review Dg Abd Acute W/chest  04/14/2013   *RADIOLOGY REPORT*  Clinical Data: Abdominal pain, history of hernia repair 03/24/2013, past history GERD, pancreatitis  ACUTE ABDOMEN SERIES (ABDOMEN 2 VIEW & CHEST 1 VIEW)  Comparison: Abdominal radiograph 03/20/2013  Findings: Upper-normal size of cardiac silhouette. Mediastinal contours and pulmonary vascularity normal. Minimal peribronchial thickening without infiltrate, pleural effusion or pneumothorax. Question old fracture  posterior right seventh rib. Bilateral nipple rings. Surgical clips right upper quadrant. Single dilated small bowel loop in left upper quadrant, nonspecific. Gas and stool present in colon. No definite bowel obstruction, bowel wall thickening or free intraperitoneal air. Bones appear diffusely demineralized. No urinary tract calcification.  IMPRESSION: Nonspecific single mildly prominent small bowel loop in left upper quadrant. No additional acute findings.   Original Report Authenticated By: Ulyses Southward, M.D.    MDM   1. Pancreatitis    Upper abdominal pain. Recent procedure to drain a cyst today. Discussed with Dr. Arlyce Dice from gastroenterology. Recommended treated as a simple pancreatitis. No  need for imaging at this time. Patient has continued pain will be admitted to hospital.    Juliet Rude. Rubin Payor, MD 04/14/13 2059

## 2013-04-14 NOTE — H&P (View-Only) (Signed)
[facility] Consult: 3:09 PM 03/17/2013   Referring Provider: Dr Jerral Ralph Primary Care Physician:  None Primary Gastroenterologist:  none  Reason for Consultation:  Ileitis and pancreatic cystic mass.   HPI: Mary [name] is a 37 y.o. female.  Hx of recurrent pancreatitis and cystic pancreatic mass, ? Mucinous cyst neoplasm.  First episode was in 2002 and CT from then is cc'd below. Subsequently spent next decade and a half in [place]. Treated several times for the cyst and episodes of pancreatitis.  Interventions included attempt to place a "stent" to drain, this became infected within 24 hours and was then removed.  Soon after had a surgery where they hooked up her SB to the cyst. This all was in about 2008.  After that had about 2 other admissions with pancreas issues, but no further interventions.  Last was about 09/2011.  Pt flying home from [facility] on 7/27 when onset of epigastric and LUQ pain with bilious emesis and watery, non-bloody diarrhea, chills, sweats. Came to ED on 7/29 and never had any BMs after that, so unable to get any stools studies.  Th nausea persists as does the pain.  These are indistinguishable from prior bouts of pancreatitis.   CT shows cystic masses in pancreatic body and terminal ileitis.  Lipase, LFTs normal. Na low to 130.  WBCs were 13.7.  Started on Cipro/Flagyl (now day 3) and WBCs now normal. Urine clx is negative.   Before 7/27 she had no GI issues.  She was eating well, did not have weight loss, no diarrhea, no heartburn, no dysphagia, no jaundice.   Her sxs persist.  Pt never drinks ETOH and never did. Smokes up to 10 cigs per day.  No occasional or regular meds.      Past Medical History  Diagnosis Date  . Hernia   . GERD (gastroesophageal reflux disease)     Past Surgical History  Procedure Laterality Date  . Abdominal surgery    . Abdominal surgery      reun y surgery  .  Cholecystectomy      Prior to Admission medications   Not on File    Scheduled Meds: . ciprofloxacin  400 mg Intravenous Q12H  . heparin  5,000 Units Subcutaneous Q8H  . metronidazole  500 mg Intravenous Q8H  . pantoprazole (PROTONIX) IV  40 mg Intravenous Q24H   Infusions: . 0.9 % NaCl with KCl 40 mEq / L 125 mL/hr at 03/17/13 1049   PRN Meds: acetaminophen, acetaminophen, diphenhydrAMINE, HYDROmorphone (DILAUDID) injection, ondansetron (ZOFRAN) IV, ondansetron   Allergies as of 03/15/2013 - Review Complete 03/15/2013  Allergen Reaction Noted  . Penicillins Anaphylaxis 03/15/2013  . Latex Itching 03/15/2013  . Morphine and related Nausea And Vomiting 03/15/2013     family history. No IBD, no pancreatitis.   History   Social History  . Marital Status: Single    Spouse Name: N/A    Number of Children: N/A  . Years of Education: N/A   Occupational History  . 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 Smoking status: Current Every Day Smoker -- 0.50 packs/day for 20 years    Types: Cigarettes  . Smokeless tobacco: Never Used  . Alcohol Use: No  . Drug Use: No  . Sexually Active: Not on file   Other Topics Concern  . Just broke up with her girlfriend of 14 years   Social  History Narrative  . No narrative on file    REVIEW OF SYSTEMS: Constitutional:  Stable weight ENT:  No nose bleeds,no congestion Pulm:  No SOB, no cough CV:  No CP , no palps, no extremity edema GU:  No dysuria, no hematuria Gyn:  Life long hx irregular menses, last was in April 2014 GI:  Per HPI Heme:  No hx low blood counts.    Transfusions:  none Neuro:  No headache, no hx seizures. Derm:  No rash, sores itching Tattoos:  All are professional starting age 39 to age 84 Endocrine:  No excessive thirst, no hx thyroid disease.  Never told she had polycystic ovary disease.  Immunization:  Not queried Travel:  Travelled from [place]  area of [place] 7/27   PHYSICAL EXAM: Vital signs in last 24 hours: Temp:  [98.2 F (36.8 C)-99.7 F (37.6 C)] 98.8 F (37.1 C) (07/31 1344) Pulse Rate:  [74-82] 74 (07/31 1344) Resp:  [18] 18 (07/31 1344) BP: (102-126)/(58-74) 108/63 mmHg (07/31 1344) SpO2:  [94 %-98 %] 96 % (07/31 1344)  General: obese wf with lip ring and shaved head.  Head:  No asymmetry, no swelling  Eyes:  No icterus or pallor Ears:  Not HOH  Nose:  No discharge or congestion Mouth:  Lip ring, moist and clear oral MM Neck:  No JVD, no goiter, no mass Lungs:  Clear bil  No SOB or cough Heart: RRR no MRG Abdomen:  Soft, active BS, ND, no mass or HSM.  Do not appreciate ventral hernia.  Tender in upper abdomen.  No guard or rebound. Large transverse surgical incision upper abdomen Rectal: scant stool is FOB negative and brown, mucoid.    Musc/Skeltl: no joint swelling, no joint deformity. Extremities:  No CCE.  Feet warm with good pedal pulses  Neurologic:  No tremor, no  Skin:  No rash or sores Tattoos:  Several on her arms. Nodes:  No cervical adenopathy.    Psych:  Pleasant, cooperative, not somnolent.   Intake/Output from previous day: 07/30 0701 - 07/31 0700 In: 1545.8 [I.V.:1545.8] Out: 1630 [Urine:1630] Intake/Output this shift:    LAB RESULTS:  Recent Labs  03/15/13 1105 03/16/13 0645 03/17/13 0615  WBC 9.0 9.1 7.7  HGB 11.0* 10.6* 10.7*  HCT 32.6* 31.5* 31.8*  PLT 225 224 280   BMET Lab Results  Component Value Date   NA 134* 03/17/2013   NA 130* 03/16/2013   NA 132* 03/15/2013   K 4.8 03/17/2013   K 4.3 03/16/2013   K 3.1* 03/15/2013   CL 102 03/17/2013   CL 101 03/16/2013   CL 95* 03/15/2013   CO2 20 03/17/2013   CO2 19 03/16/2013   CO2 22 03/15/2013   GLUCOSE 105* 03/17/2013   GLUCOSE 103* 03/16/2013   GLUCOSE 124* 03/15/2013   BUN 5* 03/17/2013   BUN 6 03/16/2013   BUN 9 03/15/2013   CREATININE 0.76 03/17/2013   CREATININE 0.69 03/16/2013   CREATININE 0.74 03/15/2013   CALCIUM  8.7 03/17/2013   CALCIUM 7.9* 03/16/2013   CALCIUM 8.8 03/15/2013   LFT  Recent Labs  03/15/13 0410  PROT 7.8  ALBUMIN 3.3*  AST 22  ALT 22  ALKPHOS 70  BILITOT 0.4    RADIOLOGY STUDIES: CT ABDOMEN AND PELVIS WITH CONTRAST 03/15/2013 Findings:  BODY WALL: There are three upper abdominal midline herniations  containing fat and omental vessel.  LOWER CHEST:  Mediastinum: 9 mm right lower. Esophageal lymph node.  Neighboring esophagus  is not dilated or thickened.  Lungs/pleura: No consolidation.  ABDOMEN/PELVIS:  Liver: Diffuse low attenuation. No focal abnormality.  Biliary: Cholecystectomy. No biliary dilatation.  Pancreas: There is a septated cystic mass arising from the  pancreatic body, with three macroscopic cysts. In aggregate, the  lesion measures 4.4 x 3.85 x 3 cm. No enhancing nodule identified.  No evidence of acute or previous pancreatitis. No main duct  dilatation. No pancreatic dilatation.  Spleen: Unremarkable.  Adrenals: 17 mm nodule in the left adrenal body.  Kidneys and ureters: No hydronephrosis or stone.  Bladder: Unremarkable.  Bowel: No obstruction. There is thickening of the terminal ileal  wall circumferentially by submucosal edema. No other segmental of  bowel inflammation identified. There has been previous small bowel  surgery with anastomosis in the left abdomen. No evidence of  creeping fat or fistulization. Normal appendix.  Retroperitoneum: Ileocolic lymphadenopathy, presumed reactive to  the above. Enlargement of left external iliac chain lymph node, 20  x 10 mm on image 82. In the absence of additional unexplained  intra-abdominal adenopathy, this is presumably reactive.  Peritoneum: No free fluid or gas.  Reproductive: Unremarkable.  Vascular: No acute abnormality.  OSSEOUS: No acute abnormalities. No suspicious lytic or blastic  lesions.  IMPRESSION:  1. Terminal ileitis which could be infectious or inflammatory. No  obstruction.  2.  4.3 cm macrocystic pancreatic mass, present since at least  2002. Favor low grade neoplasm (such as mucinous cystic pancreatic  tumor); surveillance imaging recommended.  3. 17 mm left adrenal nodule, likely adenoma.  4. Three ventral abdominal wall hernias containing omentum.  Original Report Authenticated By: Tiburcio Pea  CT ABDOMEN AND PELVIS WITH CONTRAST 02/22/2001 FINDINGS THE LUNG BASES ARE CLEAR AND THERE IS NO PLEURAL EFFUSION. THE GALLBLADDER IS SURGICALLY ABSENT.  THERE IS NO BILIARY DILATATION. THE LIVER, SPLEEN, ADRENAL GLANDS, AND KIDNEYS ARE UNREMARKABLE.  PROXIMALLY WITHIN THE PANCREATIC TAIL, THERE IS A 3.7 X 2.5 CM OVAL COLLECTION THAT MEASURES JUST ABOVE WATER ATTENUATION. THE PANCREATIC TAIL IS PROMINENT. I DO NOT SEE ANY DILATATION OF THE PANCREATIC DUCT. THERE IS SOME SOFT TISSUE STRANDING IN THE FAT SURROUNDING THE PANCREATIC TAIL. NO OTHER INTRA-ABDOMINAL FLUID COLLECTIONS ARE PRESENT.  IMPRESSION  OVAL MASS WITHIN THE PANCREATIC TAIL MEASURING NEAR WATER ATTENUATION. 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 QUESTION PANCREATITIS OF THE PANCREATIC TAIL. CORRELATION WITH THE PATIENT'S PRIOR IMAGING STUDIES IS STRONGLY RECOMMENDED. IF THOSE PRIOR STUDIES CANNOT BE OBTAINED, IT MAY BE HELPFUL TO PERFORM A FOLLOW-UP MRI TO INCLUDE POST-CONTRAST IMAGES.   CT SCAN OF THE PELVIS:  THERE IS NO PELVIC FLUID COLLECTION, MASS, OR INFLAMMATORY PROCESS. THE OVARIES ARE SLIGHTLY  PROMINENT, BUT WITHIN NORMAL LIMITS FOR SIZE.  IMPRESSION:  NEGATIVE CT OF THE PELVIS.   ENDOSCOPIC STUDIES: Not known.  Nothing in Epic.  All scopes were in [place] at [facility]  IMPRESSION: *  Ileitis.  Ongoing abdominal pain and nausea but no diarrhea since admission.  sxs started 7/27.  ? Infectious vs Crohn's? *  Pancreatic cystic mass.  Hx of same and of idiopathic, recurrent pancreatitis dating  back to 2002 S/p some sort of cyst drainage involving the small intestine. This after complications from stent placement.  *  Ventral wall hernias *  Adrenal nodule.   PLAN: *  Per Dr Marina Goodell.  *  ? Check tryglycerides to see if that may be source of previous bouts with pancreatitis.  *  MD has requested [facility] records, they told staff it takes up to 15 days to fax records!!!!!!  Dr Jerral Ralph is going to insist that records be expidited.    LOS: 2 days   Jennye Moccasin  03/17/2013, 3:09 PM Pager: [phone]  GI ATTENDING  Patient personally seen and examined. History, laboratories, and x-rays reviewed. Patient's parents in room. Agree with history and physical as outlined above. Patient presents with nausea vomiting abdominal pain and diarrhea. A letter has resolved. She has recurrent pancreatitis and cystic pancreas issues by history. A large portion of her with medical care including surgeries have been performed in [place]. We're hopeful to get records.. I suspect she may have had an acute gastroenteritis to explain her nausea vomiting diarrhea and mild ileitis on CT. However, her current pain is quite typical of pancreatic origin maybe secondary to the cyst. At this point, a supportive care with hydration, pain control, and advance the diet as tolerated. In addition to review of outside records, she will likely need an endoscopic ultrasound with cyst aspiration. I have discussed this with the patient and her family. Also, modified her pain medication a bit. We'll follow  Wilhemina Bonito. Eda Keys., M.D. [facility] of Gastroenterology

## 2013-04-14 NOTE — H&P (Signed)
[facility] History and Physical  Mary [name] ZOX:[id number] DOB: 12/30/1975 DOA: 04/14/2013  Referring physician:  Benjiman Core PCP:  Dorrene German, MD   Chief Complaint:  Abdominal pain with nausea and vomiting  HPI:  The patient is a 36 y.o. 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  Her cyst has become somewhat larger recently and she was hospitalized late July into early August with pancreatitis and terminal ileitis with severe diarrhea.  She was discharged home to complete a course of cipro/flagyl, but had no change in her 4-5 loose BMs daily with mucous but no blood or melena.  Today, she presented routinely for endoscopic ultrasound and biopsy of her pancreatic cyst which was done by Dr. Christella Hartigan, [facility].  She had 16mL of thick clear fluid removed without complication and it was sent for cytology, amylase/lipase.  Tests are pending.  She felt well post-procedure, but around 3pm developed 6/10 epigastric pain without radiation to the back or elsewhere and nausea and vomiting, nonbilious/nonbloody.  She took phenergan without relief and her pain escalated to 9/10 and she came to the ER where she had XR abd which demonstrated a single mildly prominent small bowel loop in the left upper quadrant, normal LFTs, but elevated lipase.  BMP and CBC are wnl.  She is being admitted for pain and nausea control.  GI, Dr. Arlyce Dice, has reviewed the case and recommends no imaging at this time as this is a known complication of the procedure she had this morning.   She does not take any chronic medications regularly and does not drink alcohol.  She is s/p cholecystectomy.    Review of Systems:  General:  Denies fevers, chills, weight loss or gain HEENT:  Denies changes to hearing and vision, rhinorrhea, sinus congestion, sore throat CV:  Denies  chest pain and palpitations, lower extremity edema.  PULM:  Denies SOB, wheezing, cough.   GI:  Denies nausea, vomiting, constipation, diarrhea.   GU:  Denies dysuria, frequency, urgency ENDO:  Denies polyuria, polydipsia.   HEME:  Denies hematemesis, blood in stools, melena, abnormal bruising or bleeding.  LYMPH:  Denies lymphadenopathy.   MSK:  Denies arthralgias, myalgias.   DERM:  Denies skin rash or ulcer.   NEURO:  Denies focal numbness, weakness, slurred speech, confusion, facial droop.  PSYCH:  Denies anxiety and depression.    Past Medical History  Diagnosis Date  . Hernia 03-24-13    ventral hernia remains   . GERD (gastroesophageal reflux disease)   . Pancreatitis 03-24-13    2002, 2'2013, 03-14-13  . Pancreatic cyst 2002 onset  . Anxiety   . Depression     "recent breakup with partner of 15 yrs"   Past Surgical History  Procedure Laterality Date  . Abdominal surgery  ~ 2007    some sort of pancreatic cyst drainage.   . Cholecystectomy      ~ age 48-laparoscopic  . Adenoidectomy    . Abdominal surgery      stent to pancreatic cyst that became infected within 36 hours   Social History:  reports that she has been smoking Cigarettes.  She has a 10 pack-year smoking history. She has never used smokeless tobacco. She reports that she does not drink alcohol or use illicit drugs.  Lives permanently in [place] with mom and stepfather.  Has not started working  yet and was unemployed in [place].       Allergies  Allergen Reactions  . Penicillins Anaphylaxis  . Latex Itching  . Morphine And Related Nausea And Vomiting    Family History  Problem Relation Age of Onset  . Lupus Neg Hx   . Sarcoidosis Neg Hx   . Pancreatitis Neg Hx   . Colitis Maternal Aunt   . Diabetes Mother   . Diabetes Father   . Diabetes      many family members     Prior to Admission medications   Medication Sig Start Date End Date Taking? Authorizing Provider  HYDROcodone-acetaminophen (NORCO)  10-325 MG per tablet Take 1 tablet by mouth every 4 (four) hours as needed. 03/23/13  Yes Marianne L York, PA-C  ondansetron (ZOFRAN-ODT) 4 MG disintegrating tablet Take 1 tablet (4 mg total) by mouth every 8 (eight) hours as needed for nausea. 03/23/13  Yes Marianne L York, PA-C  Pancrelipase, Lip-Prot-Amyl, 3000-9500 UNITS CPEP Take 2 capsules by mouth 3 (three) times daily before meals. 03/23/13  Yes Tora Kindred York, PA-C  pantoprazole (PROTONIX) 40 MG tablet Take 1 tablet (40 mg total) by mouth daily at 6 (six) AM. 03/23/13  Yes Stephani Police, PA-C  promethazine (PHENERGAN) 25 MG tablet Take 1 tablet (25 mg total) by mouth every 6 (six) hours as needed for nausea. 03/23/13  Yes Tora Kindred York, PA-C  ciprofloxacin (CIPRO) 500 MG tablet Take 1 tablet (500 mg total) by mouth 2 (two) times daily. 04/14/13   Rachael Fee, MD   Physical Exam: Filed Vitals:   04/14/13 1713 04/14/13 2002  BP: 108/70 111/73  Pulse: 88 89  Temp: 98 F (36.7 C)   TempSrc: Oral   Resp: 22 16  SpO2: 99% 97%     General:  Obese CF, no acute distress  Eyes:  PERRL, anicteric, non-injected.  ENT:  Nares clear.  OP clear, non-erythematous without plaques or exudates.  MMM.  Neck:  Supple without TM or JVD.    Lymph:  No cervical, supraclavicular, or submandibular LAD.  Cardiovascular:  RRR, normal S1, S2, without m/r/g.  2+ pulses, warm extremities  Respiratory:  CTA bilaterally without increased WOB.  Abdomen:   Hypoactive BS. Soft, ND, moderately TTP in the epigastric area without rebound or guarding  Skin:  No rashes or focal lesions.  Musculoskeletal:  Normal bulk and tone.  No LE edema.  Psychiatric:  A & O x 4.  Appropriate affect.  Neurologic:  CN 3-12 intact.  5/5 strength.  Sensation intact.  Labs on Admission:  Basic Metabolic Panel:  Recent Labs Lab 04/14/13 1859  NA 137  K 4.0  CL 107  CO2 23  GLUCOSE 104*  BUN 4*  CREATININE 0.45*  CALCIUM 8.9   Liver Function Tests:  Recent  Labs Lab 04/14/13 1859  AST 20  ALT 24  ALKPHOS 78  BILITOT 0.1*  PROT 7.2  ALBUMIN 3.2*    Recent Labs Lab 04/14/13 1859  LIPASE 638*  AMYLASE 150*   No results found for this basename: AMMONIA,  in the last 168 hours CBC:  Recent Labs Lab 04/14/13 1859  WBC 10.4  NEUTROABS 6.4  HGB 12.7  HCT 38.8  MCV 88.2  PLT 267   Cardiac Enzymes: No results found for this basename: CKTOTAL, CKMB, CKMBINDEX, TROPONINI,  in the last 168 hours  BNP (last 3 results) No results found for this basename: PROBNP,  in the last 8760 hours CBG:  No results found for this basename: GLUCAP,  in the last 168 hours  Radiological Exams on Admission: Dg Abd Acute W/chest  04/14/2013   *RADIOLOGY REPORT*  Clinical Data: Abdominal pain, history of hernia repair 03/24/2013, past history GERD, pancreatitis  ACUTE ABDOMEN SERIES (ABDOMEN 2 VIEW & CHEST 1 VIEW)  Comparison: Abdominal radiograph 03/20/2013  Findings: Upper-normal size of cardiac silhouette. Mediastinal contours and pulmonary vascularity normal. Minimal peribronchial thickening without infiltrate, pleural effusion or pneumothorax. Question old fracture posterior right seventh rib. Bilateral nipple rings. Surgical clips right upper quadrant. Single dilated small bowel loop in left upper quadrant, nonspecific. Gas and stool present in colon. No definite bowel obstruction, bowel wall thickening or free intraperitoneal air. Bones appear diffusely demineralized. No urinary tract calcification.  IMPRESSION: Nonspecific single mildly prominent small bowel loop in left upper quadrant. No additional acute findings.   Original Report Authenticated By: Ulyses Southward, M.D.   Assessment/Plan Principal Problem:   Pancreatitis, acute Active Problems:   Obesity, morbid   Pancreatic cyst   history of recurrent ideopathic pancreatitis   Terminal ileitis ---  Acute pancreatitis likely secondary to aspiration of cyst this morning by GI.  -  Clear liquid  diet and advance as tolearted -  Dilaudid IV prn -  Zofran and phenergan prn -  IVF -  GI to follow -  Triglycerides added -  Restart pancrelipase once diet advanced  Post-procedure antibiotic prophylaxis:  Continue ciprofloxacin which was previously prescribed x 3 days  Pancreatic cyst -  F/u results of pending tests, including cytology  Terminal ileitis and maternal aunt with "unusual form of colitis" -  Family to get additional information about aunt -  Primary to please attempt again to get records from [facility] regarding previous treatment -  GI to follow  Obesity, defer to outpatient management and encouragement.    Diet:  Clear liquid ** Fish, eggs, and dairy are okay, but patient does not eat other meats such as chicken, beef, etc.  ** Access:  PIV IVF:  D5 1/2 NS with 20kcl  Proph:  lovenox  Code Status: full Family Communication: spoke with patient and her extended family who were at bedside Disposition Plan: Admit to med-surg  Time spent: 60 min Renae Fickle [facility] Pager [phone]  If 7PM-7AM, please contact night-coverage [email or web address] Password [facility] 04/14/2013, 9:19 PM

## 2013-04-14 NOTE — Anesthesia Postprocedure Evaluation (Signed)
Anesthesia Post Note  Patient: Mary [name]  Procedure(s) Performed: Procedure(s) (LRB): UPPER ENDOSCOPIC ULTRASOUND (EUS) LINEAR (N/A)  Anesthesia type: MAC  Patient location: PACU  Post pain: Pain level controlled  Post assessment: Post-op Vital signs reviewed  Last Vitals: BP 105/72  Temp(Src) 36.7 C (Oral)  Resp 14  Ht 5\' 4"  (1.626 m)  Wt 230 lb (104.327 kg)  BMI 39.46 kg/m2  SpO2 96%  LMP 02/13/2013  Post vital signs: Reviewed  Level of consciousness: awake  Complications: No apparent anesthesia complications

## 2013-04-14 NOTE — Transfer of Care (Signed)
Immediate Anesthesia Transfer of Care Note  Patient: Mary [name]  Procedure(s) Performed: Procedure(s): UPPER ENDOSCOPIC ULTRASOUND (EUS) LINEAR (N/A)  Patient Location: PACU and Endoscopy Unit  Anesthesia Type:MAC  Level of Consciousness: awake, alert  and oriented  Airway & Oxygen Therapy: Patient Spontanous Breathing and Patient connected to nasal cannula oxygen  Post-op Assessment: Report given to PACU RN, Post -op Vital signs reviewed and stable and Patient moving all extremities  Post vital signs: Reviewed and stable  Complications: No apparent anesthesia complications

## 2013-04-14 NOTE — Progress Notes (Signed)
[facility] consulted to see patient regarding medication assistance.  Patient reports she has just moved here, is unemployed and does  not have any money.  [facility] provided patient a list of pcps who accept self pay patients, information on Affordable Care Act and Medicaid for insurance, list of discount pharmacies and web site needymeds.org for medication assistance, and list of financial assistance in the community such as local churches and [employer].  Also provided patient with RX discount card.  Patient thankful for resources.  No further needs at this time.

## 2013-04-15 ENCOUNTER — Encounter (HOSPITAL_COMMUNITY): Payer: Self-pay | Admitting: Gastroenterology

## 2013-04-15 LAB — COMPREHENSIVE METABOLIC PANEL
ALT: 21 U/L (ref 0–35)
BUN: 5 mg/dL — ABNORMAL LOW (ref 6–23)
Calcium: 8.5 mg/dL (ref 8.4–10.5)
Creatinine, Ser: 0.55 mg/dL (ref 0.50–1.10)
GFR calc Af Amer: >90 mL/min (ref >90)
GFR calc non Af Amer: >90 mL/min (ref >90)
Glucose, Bld: 112 mg/dL — ABNORMAL HIGH (ref 70–99)
Sodium: 137 mEq/L (ref 135–145)
Total Protein: 6.5 g/dL (ref 6.0–8.3)

## 2013-04-15 LAB — LIPASE, BLOOD: Lipase: 903 U/L — ABNORMAL HIGH (ref 11–59)

## 2013-04-15 LAB — SURGICAL PCR SCREEN
MRSA, PCR: NEGATIVE
Staphylococcus aureus: NEGATIVE

## 2013-04-15 LAB — CBC
HCT: 35.5 % — ABNORMAL LOW (ref 36.0–46.0)
Hemoglobin: 11.3 g/dL — ABNORMAL LOW (ref 12.0–15.0)
RBC: 3.96 MIL/uL (ref 3.87–5.11)
WBC: 8.6 10*3/uL (ref 4.0–10.5)

## 2013-04-15 MED ORDER — DIPHENHYDRAMINE HCL 50 MG/ML IJ SOLN
25.0000 mg | Freq: Four times a day (QID) | INTRAMUSCULAR | Status: DC | PRN
  Administered 2013-04-15: 50 mg via INTRAVENOUS
  Administered 2013-04-16 (×3): 25 mg via INTRAVENOUS
  Administered 2013-04-17: 50 mg via INTRAVENOUS
  Administered 2013-04-17: 25 mg via INTRAVENOUS
  Administered 2013-04-17 – 2013-04-19 (×7): 50 mg via INTRAVENOUS
  Administered 2013-04-19 – 2013-04-20 (×2): 25 mg via INTRAVENOUS
  Administered 2013-04-20 – 2013-04-22 (×10): 50 mg via INTRAVENOUS
  Administered 2013-04-23: 25 mg via INTRAVENOUS
  Administered 2013-04-23 – 2013-04-24 (×3): 50 mg via INTRAVENOUS
  Administered 2013-04-24 (×2): 25 mg via INTRAVENOUS
  Administered 2013-04-24 – 2013-04-26 (×7): 50 mg via INTRAVENOUS
  Filled 2013-04-15 (×38): qty 1

## 2013-04-15 MED ORDER — DIPHENHYDRAMINE HCL 50 MG/ML IJ SOLN
25.0000 mg | Freq: Once | INTRAMUSCULAR | Status: AC
Start: 2013-04-15 — End: 2013-04-15
  Administered 2013-04-15: 25 mg via INTRAVENOUS
  Filled 2013-04-15: qty 1

## 2013-04-15 MED ORDER — POLYVINYL ALCOHOL 1.4 % OP SOLN
1.0000 [drp] | Freq: Four times a day (QID) | OPHTHALMIC | Status: DC | PRN
  Filled 2013-04-15: qty 15

## 2013-04-15 MED ORDER — PROMETHAZINE HCL 25 MG/ML IJ SOLN
25.0000 mg | Freq: Four times a day (QID) | INTRAMUSCULAR | Status: DC | PRN
  Administered 2013-04-15 – 2013-04-20 (×11): 25 mg via INTRAVENOUS
  Filled 2013-04-15 (×11): qty 1

## 2013-04-15 MED ORDER — HYDROMORPHONE HCL PF 1 MG/ML IJ SOLN
1.0000 mg | INTRAMUSCULAR | Status: DC | PRN
  Administered 2013-04-15 – 2013-04-16 (×6): 2 mg via INTRAVENOUS
  Administered 2013-04-16: 1 mg via INTRAVENOUS
  Administered 2013-04-16 (×2): 2 mg via INTRAVENOUS
  Administered 2013-04-16: 1 mg via INTRAVENOUS
  Administered 2013-04-16 (×3): 2 mg via INTRAVENOUS
  Administered 2013-04-17: 1 mg via INTRAVENOUS
  Administered 2013-04-17 (×4): 2 mg via INTRAVENOUS
  Administered 2013-04-17: 1 mg via INTRAVENOUS
  Administered 2013-04-17 – 2013-04-19 (×12): 2 mg via INTRAVENOUS
  Administered 2013-04-19: 1 mg via INTRAVENOUS
  Administered 2013-04-19 – 2013-04-21 (×14): 2 mg via INTRAVENOUS
  Administered 2013-04-21 (×3): 1 mg via INTRAVENOUS
  Administered 2013-04-21 – 2013-04-23 (×11): 2 mg via INTRAVENOUS
  Administered 2013-04-23: 1 mg via INTRAVENOUS
  Administered 2013-04-23 – 2013-04-24 (×10): 2 mg via INTRAVENOUS
  Administered 2013-04-25: 1 mg via INTRAVENOUS
  Administered 2013-04-25 – 2013-04-26 (×9): 2 mg via INTRAVENOUS
  Administered 2013-04-26: 1 mg via INTRAVENOUS
  Administered 2013-04-26: 2 mg via INTRAVENOUS
  Filled 2013-04-15 (×3): qty 2
  Filled 2013-04-15: qty 1
  Filled 2013-04-15 (×8): qty 2
  Filled 2013-04-15: qty 1
  Filled 2013-04-15 (×21): qty 2
  Filled 2013-04-15: qty 1
  Filled 2013-04-15 (×19): qty 2
  Filled 2013-04-15: qty 1
  Filled 2013-04-15 (×30): qty 2

## 2013-04-15 NOTE — Clinical Documentation Improvement (Signed)
THIS DOCUMENT IS NOT A PERMANENT PART OF THE MEDICAL RECORD  Please update your documentation with the medical record to reflect your response to this query. If you need help knowing how to do this please call [phone].  04/15/13   Dear Dr. Rito Ehrlich / Associates,  In a better effort to capture your patient's severity of illness, reflect appropriate length of stay and utilization of resources, a review of the patient medical record has revealed the following indicators.    Based on your clinical judgment, please clarify and document in a progress note and/or discharge summary the clinical condition associated with the following supporting information:  In responding to this query please exercise your independent judgment.  The fact that a query is asked, does not imply that any particular answer is desired or expected.  INITIAL NUTRITION ASSESSMENT  Possible Clinical Conditions?  _______Mild Malnutrition  _______Moderate Malnutrition _______Severe Malnutrition   _______Protein Calorie Malnutrition _______Severe Protein Calorie Malnutrition _______Other Condition________________ _______Cannot clinically determine     Supporting Information: Risk Factors:Pancreatitis, hernia, GERD  Signs & Symptoms: Ht  5\' 4"             Wt  245 lb 6 oz                 BMI:  42.1 kg/(m^2).   ASSESSMENT:  Pt with history of hernia, GERD, and pancreatitis. S/p upper endoscopic ultrasound 8/28 with aspiration of pancreatic cyst. Met with pt who reports having diarrhea 3-5 times/day for the past 2 months with 35 pound unintended weight loss. Pt reports she was trying to hydrate herself at home with drinking lots of water and tea. Pt developed nausea/vomiting after surgery yesterday. Pt c/o abdominal pain and nausea today but denies any vomiting or diarrhea. Pt also states she has had a lot of stress in her life recently causing poor appetite in the past month. Pt reports having some ice chips this  morning but got nauseated after eating them.  Pt with elevated lipase and amylase. Found to have acute pancreatitis.    Treatment: Diet advancement per MD Monitor:  Weights, labs, diet advancement, nausea/vomiting/diarrhea Medications:  pantoprazole (PROTONIX) IV 40 mg  Daily dextrose 5 % and 0.45% NaCl    Nutrition Consult:  Pt meets criteria for severe MALNUTRITION in the context of social/enivronmental circumstance as evidenced by <50% estimated energy intake in the past month with 12.5% weight loss in the past 2 months    You may use possible, probable, or suspect with inpatient documentation. possible, probable, suspected diagnoses MUST be documented at the time of discharge  Reviewed: additional documentation in the medical record  Thank Barrie Dunker  Clinical Documentation Specialist: [phone] Health Information Management [place]

## 2013-04-15 NOTE — Progress Notes (Signed)
[facility] PROGRESS NOTE  Mary [name] WUJ:[id number] DOB: Feb 24, 1976 DOA: 04/14/2013  PCP: Dorrene German, MD  Brief HPI: The patient is a 37 y.o. 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 Her cyst has become somewhat larger recently and she was hospitalized late July into early August with pancreatitis and terminal ileitis with severe diarrhea. She was discharged home to complete a course of cipro/flagyl, but had no change in her 4-5 loose BMs daily with mucous but no blood or melena. She presented on 8/28 routinely for endoscopic ultrasound and biopsy of her pancreatic cyst which was done by Dr. Christella Hartigan, [facility]. She had 16mL of thick clear fluid removed without complication and it was sent for cytology, amylase/lipase. Tests are pending. She felt well post-procedure, but around 3pm developed 6/10 epigastric pain without radiation to the back or elsewhere and nausea and vomiting, nonbilious/nonbloody. She took phenergan without relief and her pain escalated to 9/10 and she came to the ER where she had XR abd which demonstrated a single mildly prominent small bowel loop in the left upper quadrant, normal LFTs, but elevated lipase. GI, Dr. Arlyce Dice, has reviewed the case and recommends no imaging at this time as this is a known complication of the procedure she had this morning. She does not take any chronic medications regularly and does not drink alcohol. She is s/p cholecystectomy.   Past medical history:  Past Medical History  Diagnosis Date  . Hernia 03-24-13    ventral hernia remains   . GERD (gastroesophageal reflux disease)   . Pancreatitis 03-24-13    2002, 2'2013, 03-14-13  . Pancreatic cyst 2002 onset  . Anxiety   . Depression     "recent breakup with partner of 15 yrs"    Consultants: [facility]  Procedures:  None  Antibiotics: None  Subjective: Patient still with 8/10 pain in upper abdomen. Vomited once this morning. Ice chips made pain worse.  Objective: Vital Signs  Filed Vitals:   04/14/13 1713 04/14/13 2002 04/14/13 2100 04/15/13 0526  BP: 108/70 111/73 113/66 97/57  Pulse: 88 89 86 68  Temp: 98 F (36.7 C)  98 F (36.7 C) 98 F (36.7 C)  TempSrc: Oral  Oral Oral  Resp: 22 16 20 18   Height:   5\' 4"  (1.626 m)   Weight:   111.3 kg (245 lb 6 oz)   SpO2: 99% 97% 95% 93%    Intake/Output Summary (Last 24 hours) at 04/15/13 1153 Last data filed at 04/15/13 1000  Gross per 24 hour  Intake 1073.33 ml  Output    200 ml  Net 873.33 ml   Filed Weights   04/14/13 2100  Weight: 111.3 kg (245 lb 6 oz)    General appearance: alert, cooperative, no distress and morbidly obese Resp: clear to auscultation bilaterally Cardio: regular rate and rhythm, S1, S2 normal, no murmur, click, rub or gallop GI: Soft, obese, tender in apigastric area. No rebound rigidity or guarding. BS absent. No masses or organomegaly. Skin: Skin color, texture, turgor normal. No rashes or lesions Neurologic: Alert and oriented X 3, normal strength and tone. Normal symmetric reflexes. Normal coordination and gait  Lab Results:  Basic Metabolic Panel:  Recent Labs Lab 04/14/13 1859 04/15/13 0400  NA 137 137  K 4.0 3.8  CL 107 107  CO2 23 23  GLUCOSE 104* 112*  BUN 4* 5*  CREATININE 0.45* 0.55  CALCIUM 8.9 8.5   Liver Function Tests:  Recent Labs Lab 04/14/13 1859 04/15/13 0400  AST 20 18  ALT 24 21  ALKPHOS 78 73  BILITOT 0.1* 0.1*  PROT 7.2 6.5  ALBUMIN 3.2* 2.9*    Recent Labs Lab 04/14/13 1859 04/15/13 0400  LIPASE 638* 903*  AMYLASE 150*  --    CBC:  Recent Labs Lab 04/14/13 1859 04/15/13 0400  WBC 10.4 8.6  NEUTROABS 6.4  --   HGB 12.7 11.3*  HCT 38.8 35.5*  MCV 88.2 89.6  PLT 267 248    Recent Results (from the past 240 hour(s))  SURGICAL PCR SCREEN      Status: None   Collection Time    04/15/13 12:56 AM      Result Value Range Status   MRSA, PCR NEGATIVE  NEGATIVE Final   Staphylococcus aureus NEGATIVE  NEGATIVE Final   Comment:            The Xpert SA Assay (FDA     approved for NASAL specimens     in patients over 56 years of age),     is one component of     a comprehensive surveillance     program.  Test performance has     been validated by [employer] for patients greater     than or equal to 24 year old.     It is not intended     to diagnose infection nor to     guide or monitor treatment.      Studies/Results: Dg Abd Acute W/chest  04/14/2013   *RADIOLOGY REPORT*  Clinical Data: Abdominal pain, history of hernia repair 03/24/2013, past history GERD, pancreatitis  ACUTE ABDOMEN SERIES (ABDOMEN 2 VIEW & CHEST 1 VIEW)  Comparison: Abdominal radiograph 03/20/2013  Findings: Upper-normal size of cardiac silhouette. Mediastinal contours and pulmonary vascularity normal. Minimal peribronchial thickening without infiltrate, pleural effusion or pneumothorax. Question old fracture posterior right seventh rib. Bilateral nipple rings. Surgical clips right upper quadrant. Single dilated small bowel loop in left upper quadrant, nonspecific. Gas and stool present in colon. No definite bowel obstruction, bowel wall thickening or free intraperitoneal air. Bones appear diffusely demineralized. No urinary tract calcification.  IMPRESSION: Nonspecific single mildly prominent small bowel loop in left upper quadrant. No additional acute findings.   Original Report Authenticated By: Ulyses Southward, M.D.    Medications:  Scheduled: . ciprofloxacin  400 mg Intravenous Q12H  . dextrose 5 % and 0.45% NaCl   Intravenous STAT  . diphenhydrAMINE  25 mg Intravenous Once  . enoxaparin (LOVENOX) injection  40 mg Subcutaneous Q24H  . pantoprazole (PROTONIX) IV  40 mg Intravenous Daily   Continuous: . dextrose 5 % and 0.45 % NaCl with KCl 20 mEq/L 100  mL/hr at 04/15/13 1002   ZOX:[id number], acetaminophen, bisacodyl, diphenhydrAMINE, HYDROmorphone (DILAUDID) injection, ondansetron (ZOFRAN) IV, ondansetron, polyvinyl alcohol, promethazine  Assessment/Plan:  Principal Problem:   Pancreatitis, acute Active Problems:   Obesity, morbid   Pancreatic cyst   history of recurrent ideopathic pancreatitis   Terminal ileitis    Acute pancreatitis likely related to aspiration of pancreatic cyst by GI Will make NPO. Lipase higher today. Patient remains stable. Pain medication adjusted. Trend Lipase and follow clinically. TG high but unlikely to have caused current episode. Restart pancrelipase once diet advanced. Case reviewed with GI yesterday.  Post-procedure antibiotic prophylaxis Continue ciprofloxacin which was previously prescribed x  3 days   Pancreatic cyst  F/u results of pending tests, including cytology. Can be followed as OP.  Terminal ileitis and maternal aunt with "unusual form of colitis"  Can be pursued as OP. Not an active issue currently.   Code Status:  Full Code DVT Prophylaxis:   Lovenox Family Communication: Discussed with patient  Disposition Plan: Home when better    LOS: 1 day   [facility]  [facility] Pager [phone] 04/15/2013, 11:53 AM  If 8PM-8AM, please contact night-coverage at [email or web address], password [facility]

## 2013-04-15 NOTE — Progress Notes (Signed)
Clinical Social Work Department BRIEF PSYCHOSOCIAL ASSESSMENT 04/15/2013  Patient:  Mary [name], Mary [name]     Account Number:  [id number]     Admit date:  04/14/2013  Clinical Social Worker:  Dennison Bulla  Date/Time:  04/15/2013 09:00 AM  Referred by:  Physician  Date Referred:  04/15/2013 Referred for  Advanced Directives   Other Referral:   Interview type:  Patient Other interview type:    PSYCHOSOCIAL DATA Living Status:  FAMILY Admitted from facility:   Level of care:   Primary support name:  Verlon Au Primary support relationship to patient:  FRIEND Degree of support available:   Adequate    CURRENT CONCERNS Current Concerns  Other - See comment   Other Concerns:   Advanced directives    SOCIAL WORK ASSESSMENT / PLAN CSW received referral to complete advanced directives with patient. CSW reviewed chart and met with patient at bedside. CSW introduced myself and explained role.    Patient recently moved from [place] back to [facility] after a 15 year relationship ended. Patient reports that she feels she needs HCPOA in place in case medical conditions do not improve. CSW provided packet and assisted patient in completing information. Two visitors witnessed packet and CSW notarized. CSW placed copy in chart and provided patient with original and copies.    Patient reports that she does not have a job and no insurance. Per chart review, CM has met with patient and provided information for PCP. CSW gave patient referral to Department of Social Services as well.    CSW is signing off but available if further needs arise.   Assessment/plan status:  Referral to [facility] Other assessment/ plan:   Information/referral to community resources:   Advanced directives  Department of Social Services    PATIENT'S/FAMILY'S RESPONSE TO PLAN OF CARE: Patient was alert and oriented. Patient thanked CSW for time and assistance with advanced directives. Patient reports she will contact DSS  and reports no further needs at this time.       (Coverage for Cori Razor)

## 2013-04-15 NOTE — Progress Notes (Signed)
INITIAL NUTRITION ASSESSMENT  Pt meets criteria for severe MALNUTRITION in the context of social/enivronmental circumstance as evidenced by <50% estimated energy intake in the past month with 12.5% weight loss in the past 2 months.  DOCUMENTATION CODES Per approved criteria  -Severe  malnutrition in the context of social or environmental circumstances -Morbid Obesity   INTERVENTION: - Diet advancement per MD - Will continue to monitor   NUTRITION DIAGNOSIS: Inadequate oral intake related to inability to eat as evidenced by NPO.   Goal: Advance diet as tolerated to low fat, bland diet.   Monitor:  Weights, labs, diet advancement, nausea/vomiting/diarrhea  Reason for Assessment: Nutrition risk   37 y.o. female  Admitting Dx: Pancreatitis, acute  ASSESSMENT: Pt with history of hernia, GERD, and pancreatitis. S/p upper endoscopic ultrasound 8/28 with aspiration of pancreatic cyst. Met with pt who reports having diarrhea 3-5 times/day for the past 2 months with 35 pound unintended weight loss. Pt reports she was trying to hydrate herself at home with drinking lots of water and tea. Pt developed nausea/vomiting after surgery yesterday. Pt c/o abdominal pain and nausea today but denies any vomiting or diarrhea. Pt also states she has had a lot of stress in her life recently causing poor appetite in the past month. Pt reports having some ice chips this morning but got nauseated after eating them.    Pt with elevated lipase and amylase. Found to have acute pancreatitis.   Lipase  Date Value Range Status  04/15/2013 903* 11 - 59 U/L Final   Amylase  Date Value Range Status  04/14/2013 150* 0 - 105 U/L Final     Height: Ht Readings from Last 1 Encounters:  04/14/13 5\' 4"  (1.626 m)    Weight: Wt Readings from Last 1 Encounters:  04/14/13 245 lb 6 oz (111.3 kg)    Ideal Body Weight: 120 lb   % Ideal Body Weight: 204%  Wt Readings from Last 10 Encounters:  04/14/13 245 lb 6  oz (111.3 kg)  04/14/13 230 lb (104.327 kg)  04/14/13 230 lb (104.327 kg)  03/15/13 246 lb 14.6 oz (112 kg)    Usual Body Weight: 280 lb per pt  % Usual Body Weight: 87%  BMI:  Body mass index is 42.1 kg/(m^2). Class III extreme obesity  Estimated Nutritional Needs: Kcal: 1650-1850 Protein: 65-80g Fluid: 1.6-1.8L/day  Skin: Intact   Diet Order: Clear Liquid  EDUCATION NEEDS: -No education needs identified at this time   Intake/Output Summary (Last 24 hours) at 04/15/13 1139 Last data filed at 04/15/13 1000  Gross per 24 hour  Intake 1073.33 ml  Output    200 ml  Net 873.33 ml    Last BM: PTA   Labs:   Recent Labs Lab 04/14/13 1859 04/15/13 0400  NA 137 137  K 4.0 3.8  CL 107 107  CO2 23 23  BUN 4* 5*  CREATININE 0.45* 0.55  CALCIUM 8.9 8.5  GLUCOSE 104* 112*    CBG (last 3)  No results found for this basename: GLUCAP,  in the last 72 hours  Scheduled Meds: . ciprofloxacin  400 mg Intravenous Q12H  . dextrose 5 % and 0.45% NaCl   Intravenous STAT  . enoxaparin (LOVENOX) injection  40 mg Subcutaneous Q24H  . pantoprazole (PROTONIX) IV  40 mg Intravenous Daily    Continuous Infusions: . dextrose 5 % and 0.45 % NaCl with KCl 20 mEq/L 100 mL/hr at 04/15/13 1002    Past Medical History  Diagnosis Date  . Hernia 03-24-13    ventral hernia remains   . GERD (gastroesophageal reflux disease)   . Pancreatitis 03-24-13    2002, 2'2013, 03-14-13  . Pancreatic cyst 2002 onset  . Anxiety   . Depression     "recent breakup with partner of 15 yrs"    Past Surgical History  Procedure Laterality Date  . Abdominal surgery  ~ 2007    some sort of pancreatic cyst drainage.   . Cholecystectomy      ~ age 28-laparoscopic  . Adenoidectomy    . Abdominal surgery      stent to pancreatic cyst that became infected within 36 hours  . Eus N/A 04/14/2013    Procedure: UPPER ENDOSCOPIC ULTRASOUND (EUS) LINEAR;  Surgeon: Rachael Fee, MD;  Location: [facility];   Service: Endoscopy;  Laterality: N/A;    Levon Hedger MS, RD, LDN [phone] Pager [phone] After Hours Pager

## 2013-04-16 DIAGNOSIS — E43 Unspecified severe protein-calorie malnutrition: Secondary | ICD-10-CM | POA: Diagnosis present

## 2013-04-16 LAB — COMPREHENSIVE METABOLIC PANEL
ALT: 22 U/L (ref 0–35)
AST: 19 U/L (ref 0–37)
Alkaline Phosphatase: 89 U/L (ref 39–117)
CO2: 23 mEq/L (ref 19–32)
Calcium: 8.8 mg/dL (ref 8.4–10.5)
GFR calc Af Amer: >90 mL/min (ref >90)
GFR calc non Af Amer: >90 mL/min (ref >90)
Glucose, Bld: 100 mg/dL — ABNORMAL HIGH (ref 70–99)
Potassium: 3.9 mEq/L (ref 3.5–5.1)
Sodium: 132 mEq/L — ABNORMAL LOW (ref 135–145)
Total Protein: 7.4 g/dL (ref 6.0–8.3)

## 2013-04-16 LAB — CBC
Hemoglobin: 12.2 g/dL (ref 12.0–15.0)
Platelets: 225 10*3/uL (ref 150–400)
RBC: 4.29 MIL/uL (ref 3.87–5.11)

## 2013-04-16 MED ORDER — KCL IN DEXTROSE-NACL 20-5-0.9 MEQ/L-%-% IV SOLN
INTRAVENOUS | Status: DC
  Administered 2013-04-16 (×2): via INTRAVENOUS
  Administered 2013-04-17: 1000 mL via INTRAVENOUS
  Administered 2013-04-18 – 2013-04-21 (×6): via INTRAVENOUS
  Filled 2013-04-16 (×15): qty 1000

## 2013-04-16 NOTE — Progress Notes (Addendum)
[facility] PROGRESS NOTE  Mary [name] ZOX:[id number] DOB: Jul 15, 1976 DOA: 04/14/2013  PCP: Dorrene German, MD  Brief HPI: The patient is a 37 y.o. 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 Her cyst has become somewhat larger recently and she was hospitalized late July into early August with pancreatitis and terminal ileitis with severe diarrhea. She was discharged home to complete a course of cipro/flagyl, but had no change in her 4-5 loose BMs daily with mucous but no blood or melena. She presented on 8/28 routinely for endoscopic ultrasound and biopsy of her pancreatic cyst which was done by Dr. Christella Hartigan, [facility]. She had 16mL of thick clear fluid removed without complication and it was sent for cytology, amylase/lipase. Tests are pending. She felt well post-procedure, but around 3pm developed 6/10 epigastric pain without radiation to the back or elsewhere and nausea and vomiting, nonbilious/nonbloody. She took phenergan without relief and her pain escalated to 9/10 and she came to the ER where she had XR abd which demonstrated a single mildly prominent small bowel loop in the left upper quadrant, normal LFTs, but elevated lipase. GI, Dr. Arlyce Dice, has reviewed the case and recommends no imaging at this time as this is a known complication of the procedure she had this morning. She does not take any chronic medications regularly and does not drink alcohol. She is s/p cholecystectomy.   Past medical history:  Past Medical History  Diagnosis Date  . Hernia 03-24-13    ventral hernia remains   . GERD (gastroesophageal reflux disease)   . Pancreatitis 03-24-13    2002, 2'2013, 03-14-13  . Pancreatic cyst 2002 onset  . Anxiety   . Depression     "recent breakup with partner of 15 yrs"    Consultants: [facility]  Procedures: EUS on 8/28 as  OP  Antibiotics: Cipro for total 3 days  Subjective: Patient feels slightly better but still with 8/10 pain in upper abdomen. Vomited once this morning. Twice yesterday. Passing gas occasionally.  Objective: Vital Signs  Filed Vitals:   04/15/13 0526 04/15/13 1358 04/15/13 2209 04/16/13 0549  BP: 97/57 110/69 109/72 105/68  Pulse: 68 76 79 80  Temp: 98 F (36.7 C) 97.4 F (36.3 C) 97.5 F (36.4 C) 98 F (36.7 C)  TempSrc: Oral Oral Oral Oral  Resp: 18 20 18 16   Height:      Weight:      SpO2: 93% 95% 96% 91%    Intake/Output Summary (Last 24 hours) at 04/16/13 1043 Last data filed at 04/16/13 0604  Gross per 24 hour  Intake 2623.33 ml  Output    400 ml  Net 2223.33 ml   Filed Weights   04/14/13 2100  Weight: 111.3 kg (245 lb 6 oz)    General appearance: alert, cooperative, no distress and morbidly obese Resp: clear to auscultation bilaterally Cardio: regular rate and rhythm, S1, S2 normal, no murmur, click, rub or gallop GI: Soft, obese, mildly tender in epigastric area. No rebound rigidity or guarding. BS absent. No masses or organomegaly. Skin: Skin color, texture, turgor normal. No rashes or lesions Neurologic: Alert and oriented X 3, normal strength and tone. Normal symmetric reflexes. Normal coordination and gait  Lab Results:  Basic Metabolic Panel:  Recent Labs Lab 04/14/13 1859 04/15/13 0400 04/16/13 0415  NA 137 137 132*  K 4.0 3.8 3.9  CL 107 107 100  CO2 23 23 23   GLUCOSE 104* 112* 100*  BUN 4* 5* 4*  CREATININE 0.45* 0.55 0.56  CALCIUM 8.9 8.5 8.8   Liver Function Tests:  Recent Labs Lab 04/14/13 1859 04/15/13 0400 04/16/13 0415  AST 20 18 19   ALT 24 21 22   ALKPHOS 78 73 89  BILITOT 0.1* 0.1* 0.4  PROT 7.2 6.5 7.4  ALBUMIN 3.2* 2.9* 3.1*    Recent Labs Lab 04/14/13 1859 04/15/13 0400 04/16/13 0415  LIPASE 638* 903* 934*  AMYLASE 150*  --   --    CBC:  Recent Labs Lab 04/14/13 1859 04/15/13 0400 04/16/13 0415   WBC 10.4 8.6 11.0*  NEUTROABS 6.4  --   --   HGB 12.7 11.3* 12.2  HCT 38.8 35.5* 38.0  MCV 88.2 89.6 88.6  PLT 267 248 225    Recent Results (from the past 240 hour(s))  SURGICAL PCR SCREEN     Status: None   Collection Time    04/15/13 12:56 AM      Result Value Range Status   MRSA, PCR NEGATIVE  NEGATIVE Final   Staphylococcus aureus NEGATIVE  NEGATIVE Final   Comment:            The Xpert SA Assay (FDA     approved for NASAL specimens     in patients over 25 years of age),     is one component of     a comprehensive surveillance     program.  Test performance has     been validated by [employer] for patients greater     than or equal to 64 year old.     It is not intended     to diagnose infection nor to     guide or monitor treatment.      Studies/Results: Dg Abd Acute W/chest  04/14/2013   *RADIOLOGY REPORT*  Clinical Data: Abdominal pain, history of hernia repair 03/24/2013, past history GERD, pancreatitis  ACUTE ABDOMEN SERIES (ABDOMEN 2 VIEW & CHEST 1 VIEW)  Comparison: Abdominal radiograph 03/20/2013  Findings: Upper-normal size of cardiac silhouette. Mediastinal contours and pulmonary vascularity normal. Minimal peribronchial thickening without infiltrate, pleural effusion or pneumothorax. Question old fracture posterior right seventh rib. Bilateral nipple rings. Surgical clips right upper quadrant. Single dilated small bowel loop in left upper quadrant, nonspecific. Gas and stool present in colon. No definite bowel obstruction, bowel wall thickening or free intraperitoneal air. Bones appear diffusely demineralized. No urinary tract calcification.  IMPRESSION: Nonspecific single mildly prominent small bowel loop in left upper quadrant. No additional acute findings.   Original Report Authenticated By: Ulyses Southward, M.D.    Medications:  Scheduled: . ciprofloxacin  400 mg Intravenous Q12H  . enoxaparin (LOVENOX) injection  40 mg Subcutaneous Q24H  . pantoprazole  (PROTONIX) IV  40 mg Intravenous Daily   Continuous: . dextrose 5 % and 0.45 % NaCl with KCl 20 mEq/L 100 mL/hr at 04/16/13 0821   JXB:[id number], acetaminophen, bisacodyl, diphenhydrAMINE, HYDROmorphone (DILAUDID) injection, ondansetron (ZOFRAN) IV, ondansetron, polyvinyl alcohol, promethazine  Assessment/Plan:  Principal Problem:   Pancreatitis, acute Active Problems:   Obesity, morbid   Pancreatic cyst   history of recurrent ideopathic pancreatitis   Terminal ileitis   Protein-calorie malnutrition, severe    Acute pancreatitis likely related to aspiration of pancreatic cyst by GI Slow to improve. Continue current management. Lipase still high but stable. Patient remains stable. Trend Lipase and follow clinically. TG  high but unlikely to have caused current episode. Restart pancrelipase once diet advanced. Case reviewed with GI at the time of admission.  Post-procedure antibiotic prophylaxis Continue ciprofloxacin which was previously prescribed x 3 days   Pancreatic cyst  No malignant cells seen in fluid from cyst. Can be further followed as OP.  Terminal ileitis and maternal aunt with "unusual form of colitis"  Can be pursued as OP. Not an active issue currently.   Severe Protein Calorie Malnutrition As determined by Nutritionist. Advance diet as soon as medically possible.  Will change IVF.  Code Status:  Full Code DVT Prophylaxis:   Lovenox Family Communication: Discussed with patient  Disposition Plan: Home when better. Not ready for discharge.    LOS: 2 days   [facility]  [facility] Pager [phone] 04/16/2013, 10:43 AM  If 8PM-8AM, please contact night-coverage at [email or web address], password [facility]

## 2013-04-17 LAB — CBC
HCT: 33.8 % — ABNORMAL LOW (ref 36.0–46.0)
MCH: 28.8 pg (ref 26.0–34.0)
MCHC: 33.1 g/dL (ref 30.0–36.0)
MCV: 86.9 fL (ref 78.0–100.0)
Platelets: 174 10*3/uL (ref 150–400)
RDW: 14.7 % (ref 11.5–15.5)

## 2013-04-17 LAB — BASIC METABOLIC PANEL
BUN: 3 mg/dL — ABNORMAL LOW (ref 6–23)
Calcium: 8.5 mg/dL (ref 8.4–10.5)
Creatinine, Ser: 0.57 mg/dL (ref 0.50–1.10)
GFR calc Af Amer: >90 mL/min (ref >90)
GFR calc non Af Amer: >90 mL/min (ref >90)
Glucose, Bld: 101 mg/dL — ABNORMAL HIGH (ref 70–99)
Potassium: 4.1 mEq/L (ref 3.5–5.1)

## 2013-04-17 NOTE — Care Management Note (Addendum)
    Page 1 of 2   04/28/2013     1:45:54 PM   CARE MANAGEMENT NOTE 04/28/2013  Patient:  Mary [name], Mary [name]   Account Number:  [id number]  Date Initiated:  04/17/2013  Documentation initiated by:  Lanier Clam  Subjective/Objective Assessment:   37 Y/O F ADMITTED W/ACUTE PANCREATITIS.     Action/Plan:   FROM HOME.NO PCP.   Anticipated DC Date:  04/28/2013   Anticipated DC Plan:  HOME/SELF CARE  In-house referral  PCP / [facility]      DC Planning Services  CM consult      Choice offered to / List presented to:             Status of service:  Completed, signed off Medicare Important Message given?   (If response is "NO", the following Medicare IM given date fields will be blank) Date Medicare IM given:   Date Additional Medicare IM given:    Discharge Disposition:  HOME/SELF CARE  Per UR Regulation:  Reviewed for med. necessity/level of care/duration of stay  If discussed at Long Length of Stay Meetings, dates discussed:    Comments:  04-28-13 Lorenda Ishihara RN CM 1344 Patient weaned off TNA, no longer need for [facility] services. Advanced diet and plan to d/c home today.  [id number] Davis,Rn,BSN,CCM: continues to have pain and requiring iv pain q2 to 4 hours  04/17/13 KATHY MAHABIR RN,BSN NCM WEEKEND [phone] CHECKED ON MATCH PROGRAM.PATIENT HAS ALREADY USED MATCH PROGRAM CONFIRMED 06/18/12-DOES NOT QUALIFY TO USE UNTIL AFTER 06/18/13.PATIENT VOICED UNDERSTANDING.[facility]-ENCOURAGED TO CALL FOR F/U APPT WHILE IN HOSPITALWHEN THEY OPEN ON TUESDAY/COMMUNITY RESOURCES.PATIENT ALSO RECEIVED THESE RESOURCES FROM ED CM.NO FURTHER CM NEEDS.

## 2013-04-17 NOTE — Plan of Care (Signed)
Problem: Phase II Progression Outcomes Goal: IV changed to normal saline lock Outcome: Progressing One IV is nsl the other is infusing

## 2013-04-17 NOTE — Progress Notes (Signed)
[facility] PROGRESS NOTE  Mary [name] ZOX:[id number] DOB: July 26, 1976 DOA: 04/14/2013  PCP: Mary German, MD  Brief HPI: The patient is a 36 y.o. 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 Her cyst has become somewhat larger recently and she was hospitalized late July into early August with pancreatitis and terminal ileitis with severe diarrhea. She was discharged home to complete a course of cipro/flagyl, but had no change in her 4-5 loose BMs daily with mucous but no blood or melena. She presented on 8/28 routinely for endoscopic ultrasound and biopsy of her pancreatic cyst which was done by Dr. Christella Hartigan, [facility]. She had 16mL of thick clear fluid removed without complication and it was sent for cytology, amylase/lipase. Tests are pending. She felt well post-procedure, but around 3pm developed 6/10 epigastric pain without radiation to the back or elsewhere and nausea and vomiting, nonbilious/nonbloody. She took phenergan without relief and her pain escalated to 9/10 and she came to the ER where she had XR abd which demonstrated a single mildly prominent small bowel loop in the left upper quadrant, normal LFTs, but elevated lipase. GI, Dr. Arlyce Dice, has reviewed the case and recommends no imaging at this time as this is a known complication of the procedure she had this morning. She does not take any chronic medications regularly and does not drink alcohol. She is s/p cholecystectomy.   Past medical history:  Past Medical History  Diagnosis Date  . Hernia 03-24-13    ventral hernia remains   . GERD (gastroesophageal reflux disease)   . Pancreatitis 03-24-13    2002, 2'2013, 03-14-13  . Pancreatic cyst 2002 onset  . Anxiety   . Depression     "recent breakup with partner of 15 yrs"    Consultants: [facility]  Procedures: EUS on 8/28 as  OP  Antibiotics: Cipro for total 3 days  Subjective: Patient doesn't feel much better. Vomited twice since yesterday. Pain is still 7-8/10. Denies shortness of breath. Passing gas occasionally.  Objective: Vital Signs  Filed Vitals:   04/16/13 1400 04/16/13 1910 04/16/13 2100 04/17/13 0530  BP: 100/63  106/71 100/65  Pulse: 79  89 84  Temp: 99.1 F (37.3 C) 99.1 F (37.3 C) 98.3 F (36.8 C) 98.1 F (36.7 C)  TempSrc: Oral Oral Oral Oral  Resp: 18  20 20   Height:      Weight:      SpO2: 95%  96% 96%    Intake/Output Summary (Last 24 hours) at 04/17/13 1201 Last data filed at 04/17/13 1039  Gross per 24 hour  Intake 1829.45 ml  Output   2450 ml  Net -620.55 ml   Filed Weights   04/14/13 2100  Weight: 111.3 kg (245 lb 6 oz)    General appearance: alert, cooperative, no distress and morbidly obese Resp: clear to auscultation bilaterally Cardio: regular rate and rhythm, S1, S2 normal, no murmur, click, rub or gallop GI: Soft, obese, mildly tender in epigastric area. No rebound rigidity or guarding. BS absent. No masses or organomegaly. Skin: Skin color, texture, turgor normal. No rashes or lesions Neurologic: Alert and oriented X 3, normal strength and tone. Normal symmetric reflexes. Normal coordination and gait  Lab Results:  Basic Metabolic Panel:  Recent Labs Lab 04/14/13 1859 04/15/13 0400 04/16/13 0415 04/17/13 0504  NA 137 137 132* 133*  K 4.0 3.8  3.9 4.1  CL 107 107 100 103  CO2 23 23 23 22   GLUCOSE 104* 112* 100* 101*  BUN 4* 5* 4* 3*  CREATININE 0.45* 0.55 0.56 0.57  CALCIUM 8.9 8.5 8.8 8.5   Liver Function Tests:  Recent Labs Lab 04/14/13 1859 04/15/13 0400 04/16/13 0415  AST 20 18 19   ALT 24 21 22   ALKPHOS 78 73 89  BILITOT 0.1* 0.1* 0.4  PROT 7.2 6.5 7.4  ALBUMIN 3.2* 2.9* 3.1*    Recent Labs Lab 04/14/13 1859 04/15/13 0400 04/16/13 0415 04/17/13 0504  LIPASE 638* 903* 934* 28  AMYLASE 150*  --   --   --     CBC:  Recent Labs Lab 04/14/13 1859 04/15/13 0400 04/16/13 0415 04/17/13 0504  WBC 10.4 8.6 11.0* 9.5  NEUTROABS 6.4  --   --   --   HGB 12.7 11.3* 12.2 11.2*  HCT 38.8 35.5* 38.0 33.8*  MCV 88.2 89.6 88.6 86.9  PLT 267 248 225 174    Recent Results (from the past 240 hour(s))  SURGICAL PCR SCREEN     Status: None   Collection Time    04/15/13 12:56 AM      Result Value Range Status   MRSA, PCR NEGATIVE  NEGATIVE Final   Staphylococcus aureus NEGATIVE  NEGATIVE Final   Comment:            The Xpert SA Assay (FDA     approved for NASAL specimens     in patients over 77 years of age),     is one component of     a comprehensive surveillance     program.  Test performance has     been validated by [employer] for patients greater     than or equal to 48 year old.     It is not intended     to diagnose infection nor to     guide or monitor treatment.      Studies/Results: No results found.  Medications:  Scheduled: . enoxaparin (LOVENOX) injection  40 mg Subcutaneous Q24H  . pantoprazole (PROTONIX) IV  40 mg Intravenous Daily   Continuous: . dextrose 5 % and 0.9 % NaCl with KCl 20 mEq/L 1,000 mL (04/17/13 0825)   ZOX:[id number], acetaminophen, bisacodyl, diphenhydrAMINE, HYDROmorphone (DILAUDID) injection, ondansetron (ZOFRAN) IV, ondansetron, polyvinyl alcohol, promethazine  Assessment/Plan:  Principal Problem:   Pancreatitis, acute Active Problems:   Obesity, morbid   Pancreatic cyst   history of recurrent ideopathic pancreatitis   Terminal ileitis   Protein-calorie malnutrition, severe    Acute pancreatitis likely related to aspiration of pancreatic cyst by GI Slow to improve. Continue current management. Lipase now normal. Patient remains stable. TG high but unlikely to have caused current episode. Restart pancrelipase once diet advanced. Case reviewed with GI at the time of admission. Hopefully she will turn the corner  soon.  Post-procedure antibiotic prophylaxis Continue ciprofloxacin which was previously prescribed x 3 days   Pancreatic cyst  No malignant cells seen in fluid from cyst. Can be further followed as OP.  Terminal ileitis and maternal aunt with "unusual form of colitis"  Can be pursued as OP. Not an active issue currently.   Severe Protein Calorie Malnutrition As determined by Nutritionist. Advance diet as soon as medically possible.  Code Status:  Full Code DVT Prophylaxis:   Lovenox Family Communication: Discussed with patient  Disposition Plan: Home when better. Not ready for discharge.  LOS: 3 days   [facility]  [facility] Pager [phone] 04/17/2013, 12:01 PM  If 8PM-8AM, please contact night-coverage at [email or web address], password [facility]

## 2013-04-17 NOTE — Progress Notes (Signed)
Pt upset she had to wait 30 minutes for her pain medicine, crying. Explained I was busy with another pt. Pt states I"m just not concerned about her pain. Heat applied to abd. States it feels better with the heat.

## 2013-04-18 LAB — COMPREHENSIVE METABOLIC PANEL
ALT: 18 U/L (ref 0–35)
Albumin: 2.5 g/dL — ABNORMAL LOW (ref 3.5–5.2)
Calcium: 8.3 mg/dL — ABNORMAL LOW (ref 8.4–10.5)
GFR calc Af Amer: >90 mL/min (ref >90)
Glucose, Bld: 97 mg/dL (ref 70–99)
Sodium: 136 mEq/L (ref 135–145)
Total Protein: 6.4 g/dL (ref 6.0–8.3)

## 2013-04-18 LAB — CBC
Hemoglobin: 10.2 g/dL — ABNORMAL LOW (ref 12.0–15.0)
MCH: 28.2 pg (ref 26.0–34.0)
MCHC: 32 g/dL (ref 30.0–36.0)
RDW: 14.7 % (ref 11.5–15.5)

## 2013-04-18 LAB — LIPASE, BLOOD: Lipase: 11 U/L (ref 11–59)

## 2013-04-18 NOTE — Progress Notes (Signed)
[facility] PROGRESS NOTE  Mary [name] FAO:[id number] DOB: Jul 14, 1976 DOA: 04/14/2013  PCP: Dorrene German, MD  Brief HPI: The patient is a 37 y.o. 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 Her cyst has become somewhat larger recently and she was hospitalized late July into early August with pancreatitis and terminal ileitis with severe diarrhea. She was discharged home to complete a course of cipro/flagyl, but had no change in her 4-5 loose BMs daily with mucous but no blood or melena. She presented on 8/28 routinely for endoscopic ultrasound and biopsy of her pancreatic cyst which was done by Dr. Christella Hartigan, [facility]. She had 16mL of thick clear fluid removed without complication and it was sent for cytology, amylase/lipase. Tests are pending. She felt well post-procedure, but around 3pm developed 6/10 epigastric pain without radiation to the back or elsewhere and nausea and vomiting, nonbilious/nonbloody. She took phenergan without relief and her pain escalated to 9/10 and she came to the ER where she had XR abd which demonstrated a single mildly prominent small bowel loop in the left upper quadrant, normal LFTs, but elevated lipase. GI, Dr. Arlyce Dice, has reviewed the case and recommends no imaging at this time as this is a known complication of the procedure she had this morning. She does not take any chronic medications regularly and does not drink alcohol. She is s/p cholecystectomy.   Past medical history:  Past Medical History  Diagnosis Date  . Hernia 03-24-13    ventral hernia remains   . GERD (gastroesophageal reflux disease)   . Pancreatitis 03-24-13    2002, 2'2013, 03-14-13  . Pancreatic cyst 2002 onset  . Anxiety   . Depression     "recent breakup with partner of 15 yrs"    Consultants: Discussed with [facility] at  admission  Procedures: EUS on 8/28 as OP  Antibiotics: Cipro for total 3 days  Subjective: Patient continues to have 8/10 pain. No vomiting since yesterday. Denies shortness of breath. Passing gas occasionally.  Objective: Vital Signs  Filed Vitals:   04/17/13 0530 04/17/13 1430 04/17/13 2200 04/18/13 0556  BP: 100/65 103/67 108/72 91/65  Pulse: 84 79 85 69  Temp: 98.1 F (36.7 C) 98.1 F (36.7 C) 97.9 F (36.6 C) 98.1 F (36.7 C)  TempSrc: Oral Oral Oral Oral  Resp: 20 18 18 18   Height:      Weight:      SpO2: 96% 95% 96% 92%    Intake/Output Summary (Last 24 hours) at 04/18/13 1059 Last data filed at 04/18/13 1031  Gross per 24 hour  Intake 2236.67 ml  Output   1300 ml  Net 936.67 ml   Filed Weights   04/14/13 2100  Weight: 111.3 kg (245 lb 6 oz)    General appearance: alert, cooperative, no distress and morbidly obese Resp: clear to auscultation bilaterally Cardio: regular rate and rhythm, S1, S2 normal, no murmur, click, rub or gallop GI: Soft, obese, mildly tender in epigastric area. No rebound rigidity or guarding. BS absent. No masses or organomegaly. Skin: Skin color, texture, turgor normal. No rashes or lesions Neurologic: Alert and oriented X 3, normal strength and tone. Normal symmetric reflexes. Normal coordination and gait  Lab Results:  Basic Metabolic Panel:  Recent Labs Lab 04/14/13 1859 04/15/13 0400 04/16/13 0415 04/17/13 0504 04/18/13 0430  NA 137 137 132* 133* 136  K 4.0 3.8 3.9 4.1 4.0  CL 107 107 100 103 105  CO2 23 23 23 22 23   GLUCOSE 104* 112* 100* 101* 97  BUN 4* 5* 4* 3* 3*  CREATININE 0.45* 0.55 0.56 0.57 0.55  CALCIUM 8.9 8.5 8.8 8.5 8.3*   Liver Function Tests:  Recent Labs Lab 04/14/13 1859 04/15/13 0400 04/16/13 0415 04/18/13 0430  AST 20 18 19 23   ALT 24 21 22 18   ALKPHOS 78 73 89 69  BILITOT 0.1* 0.1* 0.4 0.4  PROT 7.2 6.5 7.4 6.4  ALBUMIN 3.2* 2.9* 3.1* 2.5*    Recent Labs Lab 04/14/13 1859  04/15/13 0400 04/16/13 0415 04/17/13 0504 04/18/13 0430  LIPASE 638* 903* 934* 28 11  AMYLASE 150*  --   --   --   --    CBC:  Recent Labs Lab 04/14/13 1859 04/15/13 0400 04/16/13 0415 04/17/13 0504 04/18/13 0430  WBC 10.4 8.6 11.0* 9.5 7.0  NEUTROABS 6.4  --   --   --   --   HGB 12.7 11.3* 12.2 11.2* 10.2*  HCT 38.8 35.5* 38.0 33.8* 31.9*  MCV 88.2 89.6 88.6 86.9 88.1  PLT 267 248 225 174 183    Recent Results (from the past 240 hour(s))  SURGICAL PCR SCREEN     Status: None   Collection Time    04/15/13 12:56 AM      Result Value Range Status   MRSA, PCR NEGATIVE  NEGATIVE Final   Staphylococcus aureus NEGATIVE  NEGATIVE Final   Comment:            The Xpert SA Assay (FDA     approved for NASAL specimens     in patients over 28 years of age),     is one component of     a comprehensive surveillance     program.  Test performance has     been validated by [employer] for patients greater     than or equal to 52 year old.     It is not intended     to diagnose infection nor to     guide or monitor treatment.      Studies/Results: No results found.  Medications:  Scheduled: . enoxaparin (LOVENOX) injection  40 mg Subcutaneous Q24H  . pantoprazole (PROTONIX) IV  40 mg Intravenous Daily   Continuous: . dextrose 5 % and 0.9 % NaCl with KCl 20 mEq/L 100 mL/hr at 04/18/13 1610   RUE:[id number], acetaminophen, bisacodyl, diphenhydrAMINE, HYDROmorphone (DILAUDID) injection, ondansetron (ZOFRAN) IV, ondansetron, polyvinyl alcohol, promethazine  Assessment/Plan:  Principal Problem:   Pancreatitis, acute Active Problems:   Obesity, morbid   Pancreatic cyst   history of recurrent ideopathic pancreatitis   Terminal ileitis   Protein-calorie malnutrition, severe    Acute pancreatitis likely related to aspiration of pancreatic cyst by GI Slow to improve. Continue current management. Lipase now normal. Patient remains stable. No concerning signs like  fever. No need for imaging studies at this time. TG high but unlikely to have caused current episode. Restart pancrelipase once diet advanced. Case reviewed with GI at the time of admission. Hopefully she will turn the corner soon. Leave NPO for now. Ambulate.  Post-procedure antibiotic prophylaxis Continue ciprofloxacin which was previously prescribed x 3 days   Pancreatic cyst  No malignant cells seen in fluid from cyst. Can be further followed as OP.  Terminal ileitis and maternal aunt with "unusual form of colitis"  Can be  pursued as OP. Not an active issue currently.   Severe Protein Calorie Malnutrition As determined by Nutritionist. Advance diet as soon as medically possible.  Code Status:  Full Code DVT Prophylaxis:   Lovenox Family Communication: Discussed with patient  Disposition Plan: Home when better. Not ready for discharge.    LOS: 4 days   [facility]  [facility] Pager [phone] 04/18/2013, 10:59 AM  If 8PM-8AM, please contact night-coverage at [email or web address], password [facility]

## 2013-04-19 ENCOUNTER — Inpatient Hospital Stay (HOSPITAL_COMMUNITY): Payer: MEDICAID

## 2013-04-19 LAB — BASIC METABOLIC PANEL
CO2: 23 mEq/L (ref 19–32)
Chloride: 104 mEq/L (ref 96–112)
GFR calc Af Amer: >90 mL/min (ref >90)
Potassium: 4 mEq/L (ref 3.5–5.1)

## 2013-04-19 MED ORDER — IOHEXOL 300 MG/ML  SOLN
100.0000 mL | Freq: Once | INTRAMUSCULAR | Status: AC | PRN
  Administered 2013-04-19: 100 mL via INTRAVENOUS

## 2013-04-19 MED ORDER — IOHEXOL 300 MG/ML  SOLN
25.0000 mL | Freq: Once | INTRAMUSCULAR | Status: AC | PRN
  Administered 2013-04-19: 25 mL via ORAL

## 2013-04-19 MED ORDER — SODIUM CHLORIDE 0.9 % IJ SOLN
10.0000 mL | INTRAMUSCULAR | Status: DC | PRN
  Administered 2013-04-23 – 2013-04-28 (×4): 10 mL

## 2013-04-19 NOTE — Progress Notes (Signed)
[facility] PROGRESS NOTE  Mary [name] WUJ:[id number] DOB: [birth year] DOA: 04/14/2013  PCP: Dorrene German, MD  Brief HPI: The patient is a 37 y.o. 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 Her cyst has become somewhat larger recently and she was hospitalized late July into early August with pancreatitis and terminal ileitis with severe diarrhea. She was discharged home to complete a course of cipro/flagyl, but had no change in her 4-5 loose BMs daily with mucous but no blood or melena. She presented on 8/28 routinely for endoscopic ultrasound and biopsy of her pancreatic cyst which was done by Dr. Christella Hartigan, [facility]. She had 16mL of thick clear fluid removed without complication and it was sent for cytology, amylase/lipase. Tests are pending. She felt well post-procedure, but around 3pm developed 6/10 epigastric pain without radiation to the back or elsewhere and nausea and vomiting, nonbilious/nonbloody. She took phenergan without relief and her pain escalated to 9/10 and she came to the ER where she had XR abd which demonstrated a single mildly prominent small bowel loop in the left upper quadrant, normal LFTs, but elevated lipase. GI, Dr. Arlyce Dice, has reviewed the case and recommends no imaging at this time as this is a known complication of the procedure she had this morning. She does not take any chronic medications regularly and does not drink alcohol. She is s/p cholecystectomy.   Past medical history:  Past Medical History  Diagnosis Date  . Hernia 03-24-13    ventral hernia remains   . GERD (gastroesophageal reflux disease)   . Pancreatitis 03-24-13    2002, 2'2013, 03-14-13  . Pancreatic cyst 2002 onset  . Anxiety   . Depression     "recent breakup with partner of 15 yrs"    Consultants: Discussed with [facility] at  admission  Procedures: EUS on 8/28 as OP  Antibiotics: Cipro for total 3 days  Subjective: Patient continues to have 7-8/10 pain. Lost IV access last night. Had multiple episodes of vomiting today. Denies shortness of breath. Passing gas occasionally.  Objective: Vital Signs  Filed Vitals:   04/18/13 1342 04/18/13 2342 04/19/13 0521 04/19/13 1100  BP: 109/64 100/68 105/64 113/68  Pulse: 73 72 73 91  Temp: 98.6 F (37 C) 98.3 F (36.8 C) 98.4 F (36.9 C) 97.8 F (36.6 C)  TempSrc: Oral Oral Oral Oral  Resp: 18 18 18 20   Height:      Weight:      SpO2: 99% 99% 98% 96%    Intake/Output Summary (Last 24 hours) at 04/19/13 1218 Last data filed at 04/19/13 1038  Gross per 24 hour  Intake   1875 ml  Output   1950 ml  Net    -75 ml   Filed Weights   04/14/13 2100  Weight: 111.3 kg (245 lb 6 oz)    General appearance: alert, cooperative, no distress and morbidly obese Resp: clear to auscultation bilaterally Cardio: regular rate and rhythm, S1, S2 normal, no murmur, click, rub or gallop GI: Soft, obese, mildly tender in epigastric area with fullness. No rebound rigidity or guarding. BS absent. No masses or organomegaly. Skin: Skin color, texture, turgor normal. No rashes or lesions Neurologic: Alert and oriented X 3, normal strength and tone. Normal symmetric reflexes. Normal coordination and gait  Lab Results:  Basic Metabolic Panel:  Recent Labs Lab 04/15/13 0400 04/16/13  1610 04/17/13 0504 04/18/13 0430 04/19/13 0430  NA 137 132* 133* 136 138  K 3.8 3.9 4.1 4.0 4.0  CL 107 100 103 105 104  CO2 23 23 22 23 23   GLUCOSE 112* 100* 101* 97 67*  BUN 5* 4* 3* 3* 3*  CREATININE 0.55 0.56 0.57 0.55 0.60  CALCIUM 8.5 8.8 8.5 8.3* 9.0   Liver Function Tests:  Recent Labs Lab 04/14/13 1859 04/15/13 0400 04/16/13 0415 04/18/13 0430  AST 20 18 19 23   ALT 24 21 22 18   ALKPHOS 78 73 89 69  BILITOT 0.1* 0.1* 0.4 0.4  PROT 7.2 6.5 7.4 6.4  ALBUMIN 3.2* 2.9*  3.1* 2.5*    Recent Labs Lab 04/14/13 1859 04/15/13 0400 04/16/13 0415 04/17/13 0504 04/18/13 0430  LIPASE 638* 903* 934* 28 11  AMYLASE 150*  --   --   --   --    CBC:  Recent Labs Lab 04/14/13 1859 04/15/13 0400 04/16/13 0415 04/17/13 0504 04/18/13 0430  WBC 10.4 8.6 11.0* 9.5 7.0  NEUTROABS 6.4  --   --   --   --   HGB 12.7 11.3* 12.2 11.2* 10.2*  HCT 38.8 35.5* 38.0 33.8* 31.9*  MCV 88.2 89.6 88.6 86.9 88.1  PLT 267 248 225 174 183    Recent Results (from the past 240 hour(s))  SURGICAL PCR SCREEN     Status: None   Collection Time    04/15/13 12:56 AM      Result Value Range Status   MRSA, PCR NEGATIVE  NEGATIVE Final   Staphylococcus aureus NEGATIVE  NEGATIVE Final   Comment:            The Xpert SA Assay (FDA     approved for NASAL specimens     in patients over 1 years of age),     is one component of     a comprehensive surveillance     program.  Test performance has     been validated by [employer] for patients greater     than or equal to 88 year old.     It is not intended     to diagnose infection nor to     guide or monitor treatment.      Studies/Results: No results found.  Medications:  Scheduled: . enoxaparin (LOVENOX) injection  40 mg Subcutaneous Q24H  . pantoprazole (PROTONIX) IV  40 mg Intravenous Daily   Continuous: . dextrose 5 % and 0.9 % NaCl with KCl 20 mEq/L 75 mL/hr at 04/18/13 1936   RUE:[id number], acetaminophen, bisacodyl, diphenhydrAMINE, HYDROmorphone (DILAUDID) injection, ondansetron (ZOFRAN) IV, ondansetron, polyvinyl alcohol, promethazine, sodium chloride  Assessment/Plan:  Principal Problem:   Pancreatitis, acute Active Problems:   Obesity, morbid   Pancreatic cyst   history of recurrent ideopathic pancreatitis   Terminal ileitis   Protein-calorie malnutrition, severe    Acute pancreatitis likely related to aspiration of pancreatic cyst by GI Should have shown some improvement by now. Will  officially consult GI. Discussed with Dr. Arlyce Dice at time of admission. May need to consider imaging but will see first what GI has to say.Continue current management. Lipase now normal. Patient remains stable. No concerning signs like fever. TG high but unlikely to have caused current episode. Restart pancrelipase once diet advanced. Leave NPO for now. Ambulate.  Post-procedure antibiotic prophylaxis Completed ciprofloxacin.   Pancreatic cyst  No malignant cells seen in fluid from cyst. Can be further followed as  OP.  Terminal ileitis and maternal aunt with "unusual form of colitis"  Can be pursued as OP. Not an active issue currently.   Severe Protein Calorie Malnutrition As determined by Nutritionist. Advance diet as soon as medically possible.  Code Status:  Full Code DVT Prophylaxis:   Lovenox Family Communication: Discussed with patient  Disposition Plan: Home when better. Not ready for discharge.    LOS: 5 days   [facility]  [facility] Pager [phone] 04/19/2013, 12:18 PM  If 8PM-8AM, please contact night-coverage at [email or web address], password [facility]

## 2013-04-19 NOTE — Consult Note (Signed)
Referring Provider: No ref. provider found Primary Care Physician:  Dorrene German, MD Primary Gastroenterologist:  None, unassigned  Reason for Consultation:  Pancreatitis  HPI: Mary [name] is a 36 y.o. 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 Her cyst had become somewhat larger recently and she was hospitalized late July into early August with pancreatitis and terminal ileitis with severe diarrhea. She was discharged home to complete a course of cipro/flagyl.  She presented for endoscopic ultrasound and biopsy of her pancreatic cyst, which was done by Dr. Christella Hartigan on 8/28. She had 16mL of thick clear fluid removed without complication and it was sent for cytology, amylase/lipase. Tests are pending. She felt well post-procedure, but around 3pm developed 6/10 epigastric pain without radiation to the back or elsewhere and nausea and vomiting, nonbilious/nonbloody. She took phenergan without relief and her pain escalated to 9/10 and she came to the ER where she had XR abd which demonstrated a single mildly prominent small bowel loop in the left upper quadrant, normal LFTs, but elevated lipase at 638. BMP and CBC are wnl. She was admitted for pain and nausea control.  Dr. Arlyce Dice was contacted and reviewed the case; he recommended no imaging at the time as this was a known complication of the procedure she had earlier that morning. She does not take any chronic medications regularly and does not drink alcohol. She is s/p cholecystectomy.  Lipase reached 934 during her stay.  No fevers or elevated WBC count so far on admission.   We are being consulted today regarding her ongoing pain despite the return of lipase to normal limits.  She says that she continues to have pain in her upper abdomen.  Still with nausea and vomiting as well.  Lost her IV access last  night and could not get any meds for pain for nausea/vomiting until PICC line was put in earlier today.  Not drinking any liquids.  Also says that she was having diarrhea for the past two months up until this admission since she has not been eating or drinking anything.   Past Medical History  Diagnosis Date  . Hernia 03-24-13    ventral hernia remains   . GERD (gastroesophageal reflux disease)   . Pancreatitis 03-24-13    2002, 2'2013, 03-14-13  . Pancreatic cyst 2002 onset  . Anxiety   . Depression     "recent breakup with partner of 15 yrs"    Past Surgical History  Procedure Laterality Date  . Abdominal surgery  ~ 2007    some sort of pancreatic cyst drainage.   . Cholecystectomy      ~ age 20-laparoscopic  . Adenoidectomy    . Abdominal surgery      stent to pancreatic cyst that became infected within 36 hours  . Eus N/A 04/14/2013    Procedure: UPPER ENDOSCOPIC ULTRASOUND (EUS) LINEAR;  Surgeon: Rachael Fee, MD;  Location: [facility];  Service: Endoscopy;  Laterality: N/A;    Prior to Admission medications   Medication Sig Start Date End Date Taking? Authorizing Provider  HYDROcodone-acetaminophen (NORCO) 10-325 MG per tablet Take 1 tablet by mouth every 4 (four) hours as needed. 03/23/13  Yes Marianne L York, PA-C  ondansetron (ZOFRAN-ODT) 4 MG disintegrating tablet Take 1 tablet (4 mg total) by mouth every 8 (eight) hours as needed for nausea. 03/23/13  Yes Clerance Lav  L York, PA-C  Pancrelipase, Lip-Prot-Amyl, 3000-9500 UNITS CPEP Take 2 capsules by mouth 3 (three) times daily before meals. 03/23/13  Yes Tora Kindred York, PA-C  pantoprazole (PROTONIX) 40 MG tablet Take 1 tablet (40 mg total) by mouth daily at 6 (six) AM. 03/23/13  Yes Stephani Police, PA-C  promethazine (PHENERGAN) 25 MG tablet Take 1 tablet (25 mg total) by mouth every 6 (six) hours as needed for nausea. 03/23/13  Yes Tora Kindred York, PA-C  ciprofloxacin (CIPRO) 500 MG tablet Take 1 tablet (500 mg total) by mouth 2  (two) times daily. 04/14/13   Rachael Fee, MD    Current Facility-Administered Medications  Medication Dose Route Frequency Provider Last Rate Last Dose  . acetaminophen (TYLENOL) tablet 650 mg  650 mg Oral Q6H PRN Renae Fickle, MD       Or  . acetaminophen (TYLENOL) suppository 650 mg  650 mg Rectal Q6H PRN Renae Fickle, MD      . bisacodyl (DULCOLAX) suppository 10 mg  10 mg Rectal Daily PRN Renae Fickle, MD      . dextrose 5 % and 0.9 % NaCl with KCl 20 mEq/L infusion   Intravenous Continuous Osvaldo Shipper, MD 80 mL/hr at 04/19/13 1223    . diphenhydrAMINE (BENADRYL) injection 25-50 mg  25-50 mg Intravenous Q6H PRN Renae Fickle, MD   25 mg at 04/19/13 1127  . enoxaparin (LOVENOX) injection 40 mg  40 mg Subcutaneous Q24H Renae Fickle, MD      . HYDROmorphone (DILAUDID) injection 1-2 mg  1-2 mg Intravenous Q3H PRN Osvaldo Shipper, MD   2 mg at 04/19/13 1035  . ondansetron (ZOFRAN) tablet 4 mg  4 mg Oral Q6H PRN Renae Fickle, MD       Or  . ondansetron [facility]) injection 4 mg  4 mg Intravenous Q6H PRN Renae Fickle, MD   4 mg at 04/19/13 1035  . pantoprazole (PROTONIX) injection 40 mg  40 mg Intravenous Daily Renae Fickle, MD   40 mg at 04/19/13 1308  . polyvinyl alcohol (LIQUIFILM TEARS) 1.4 % ophthalmic solution 1 drop  1 drop Both Eyes QID PRN Osvaldo Shipper, MD      . promethazine (PHENERGAN) injection 25 mg  25 mg Intravenous Q6H PRN Renae Fickle, MD   25 mg at 04/18/13 2239  . sodium chloride 0.9 % injection 10-40 mL  10-40 mL Intracatheter PRN Osvaldo Shipper, MD        Allergies as of 04/14/2013 - Review Complete 04/14/2013  Allergen Reaction Noted  . Penicillins Anaphylaxis 03/15/2013  . Latex Itching 03/15/2013  . Morphine and related Nausea And Vomiting 03/15/2013    Family History  Problem Relation Age of Onset  . Lupus Neg Hx   . Sarcoidosis Neg Hx   . Pancreatitis Neg Hx   . Colitis Maternal Aunt   . Diabetes Mother   . Diabetes Father    . Diabetes      many family members    History   Social History  . Marital Status: Single    Spouse Name: N/A    Number of Children: N/A  . Years of Education: N/A   Occupational History  . Not on file.   Social History Main Topics  . Smoking status: Current Every Day Smoker -- 0.50 packs/day for 20 years    Types: Cigarettes  . Smokeless tobacco: Never Used  . Alcohol Use: No  . Drug Use: No  . Sexual Activity: Not Currently   Other  Topics Concern  . Not on file   Social History Narrative   Lives permanently in [place] with mom and stepfather.  Has not started working yet and was unemployed in [place].             Review of Systems: Ten point ROS is O/W negative except as mentioned in HPI.   Physical Exam: Vital signs in last 24 hours: Temp:  [97.8 F (36.6 C)-98.6 F (37 C)] 97.8 F (36.6 C) (09/02 1100) Pulse Rate:  [72-91] 91 (09/02 1100) Resp:  [18-20] 20 (09/02 1100) BP: (100-113)/(64-68) 113/68 mmHg (09/02 1100) SpO2:  [96 %-99 %] 96 % (09/02 1100) Last BM Date: 04/18/13 General:   Alert, Well-developed, well-nourished, pleasant and cooperative in NAD Head:  Normocephalic and atraumatic. Eyes:  Sclera clear, no icterus.  Conjunctiva pink. Ears:  Normal auditory acuity. Mouth:  No deformity or lesions.   Lungs:  Clear throughout to auscultation.  No wheezes, crackles, or rhonchi.  Heart:  Regular rate and rhythm; no murmurs, clicks, rubs,  or gallops. Abdomen:  Soft, non-distended.  BS present but quiet/hypoactive.  TTP in the epigastrium LUQ and mid-abdomen. Rectal:  Deferred  Msk:  Symmetrical without gross deformities. Pulses:  Normal pulses noted. Extremities:  Without clubbing or edema. Neurologic:  Alert and  oriented x4;  grossly normal neurologically. Skin:  Intact without significant lesions or rashes. Psych:  Alert and cooperative. Normal mood and affect.  Intake/Output from previous day: 09/01 0701 - 09/02 0700 In: 2163.8  [I.V.:2163.8] Out: 1250 [Urine:1250] Intake/Output this shift: Total I/O In: 311.3 [I.V.:311.3] Out: 1200 [Urine:1200]  Lab Results:  Recent Labs  04/17/13 0504 04/18/13 0430  WBC 9.5 7.0  HGB 11.2* 10.2*  HCT 33.8* 31.9*  PLT 174 183   BMET  Recent Labs  04/17/13 0504 04/18/13 0430 04/19/13 0430  NA 133* 136 138  K 4.1 4.0 4.0  CL 103 105 104  CO2 22 23 23   GLUCOSE 101* 97 67*  BUN 3* 3* 3*  CREATININE 0.57 0.55 0.60  CALCIUM 8.5 8.3* 9.0   LFT  Recent Labs  04/18/13 0430  PROT 6.4  ALBUMIN 2.5*  AST 23  ALT 18  ALKPHOS 69  BILITOT 0.4   IMPRESSION:  -Acute pancreatitis vs other complication related to EUS procedure.  Still with ongoing pain despite the fact that lipase has returned to normal. -Diarrhea x 2 months:  Had terminal ileitis on CT scan in July but was suspected to be infectious and was treated with cipro and flagyl.  No further diarrhea in fasting state.  ? If diarrhea is secondary to pancreatic insufficiency vs other cause.  PLAN: -Will plan for CT scan with contrast for further evaluation. -Continue IVF's, pain control, anti-emetics, and other supportive care for now.   ZEHR, JESSICA D.  04/19/2013, 1:09 PM  Pager number [phone]  [facility] Attending  I have also seen and assessed the patient and agree with the above note. Did have pain and elevated lipase after EUS and FNA - ? If she had pancreatitis from that or some other issue related. Will check CT given ongoing pain. She also has diarrhea and could have an abnormal ileum related to that vs. (more likely) a pancreatic insfficiency as it seems to be post-prandial and has not been an issue while on clear liquids here.  Iva Boop, MD, [facility] [phone] (pager) 04/19/2013 5:30 PM

## 2013-04-19 NOTE — Progress Notes (Signed)
Peripherally Inserted Central Catheter/Midline Placement  The IV Nurse has discussed with the patient and/or persons authorized to consent for the patient, the purpose of this procedure and the potential benefits and risks involved with this procedure.  The benefits include less needle sticks, lab draws from the catheter and patient may be discharged home with the catheter.  Risks include, but not limited to, infection, bleeding, blood clot (thrombus formation), and puncture of an artery; nerve damage and irregular heat beat.  Alternatives to this procedure were also discussed.  PICC/Midline Placement Documentation  PICC / Midline Single Lumen 04/19/13 PICC Right Basilic (Active)  Dressing Change Due 04/26/13 04/19/2013 10:00 AM       Stacie Glaze Horton 04/19/2013, 10:44 AM

## 2013-04-20 DIAGNOSIS — D649 Anemia, unspecified: Secondary | ICD-10-CM | POA: Diagnosis not present

## 2013-04-20 DIAGNOSIS — K859 Acute pancreatitis without necrosis or infection, unspecified: Secondary | ICD-10-CM

## 2013-04-20 DIAGNOSIS — K862 Cyst of pancreas: Secondary | ICD-10-CM

## 2013-04-20 DIAGNOSIS — K863 Pseudocyst of pancreas: Secondary | ICD-10-CM

## 2013-04-20 MED ORDER — PROMETHAZINE HCL 25 MG/ML IJ SOLN
25.0000 mg | Freq: Four times a day (QID) | INTRAMUSCULAR | Status: DC | PRN
  Administered 2013-04-21 – 2013-04-22 (×2): 25 mg via INTRAVENOUS
  Filled 2013-04-20 (×2): qty 1

## 2013-04-20 MED ORDER — PANCRELIPASE (LIP-PROT-AMYL) 12000-38000 UNITS PO CPEP
2.0000 | ORAL_CAPSULE | Freq: Three times a day (TID) | ORAL | Status: DC
  Administered 2013-04-20 – 2013-04-22 (×7): 2 via ORAL
  Filled 2013-04-20 (×26): qty 2

## 2013-04-20 NOTE — Consult Note (Signed)
Reason for Consult: Chronic pancreatitis, pancreatic cyst Referring Physician: Dr. Leighton Roach Mary [name] is an 37 y.o. female.  HPI: 37 y.o. year-old female with history of recurrent pancreatitis and recurrent pancreatitic cyst.  Her first episode was in December of 2012.  The cause was unknown as she did not drink and her gallbladder had been removed when she was 37 yrs old (this was due to gallstones).  In the next 12 months she had a total of 5 episodes of pancreatitis.  She had an endoscopy in 2003 and was found to have the cyst.  She continued to have episodes of pancreatitis at least 2 times per year.  She was evaluated by GI and by surgery at [facility] (she does not know the names of the MDs that treated her).  She underwent a pancreatic stent endoscopicly.  This became infected and was removed 36 hours later.  At that time she underwent an open surgery for what sounds like a small bowel to pancrease anastomosis to drain the cyst.  This did not really help her episodes of pancreatitis and continues to have about 1-2 per year.  She has moved back to [facility] and has had several episodes here.  Her cyst has become somewhat larger recently and she was hospitalized late July into early August with pancreatitis and terminal ileitis with severe diarrhea (this has resolved by CT now). She was discharged home to complete a course of cipro/flagyl, but had no change in her 4-5 loose BMs daily with mucous but no blood or melena. She was seen by Dr. Gerilyn Pilgrim on 04/14/13 routinely for endoscopic ultrasound and biopsy of her pancreatic cyst. She had 16mL of thick clear fluid removed without complication and it was sent for cytology, amylase/lipase but not cultures. She felt well post-procedure, but around 3pm developed 6/10 epigastric pain with nausea and vomiting.  She took phenergan without relief and her pain escalated to 9/10 and she came to the ER where she had abd x-ray which demonstrated a single mildly  prominent small bowel loop in the left upper quadrant, normal LFTs, but elevated lipase. She was admitted for pain and nausea control.   She has had a CT of the abd since admission which showed  1. Resolution of previously described terminal ileitis.  2. Similar macrolobulated cystic lesion in the pancreatic  tail/body junction.Pancreatic body/tail junction macrolobulated cystic lesion measures  4.4 x 3.7 cm. 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 Cystic pancreatic neoplasm such as mucinous  cystadenoma or oligocystic serous cystadenoma could look similar.  Imaging surveillance suggested. This should optimally be performed  with pre and post contrast MRI at approximately 6 months.  3. Slight increase in pelvic adenopathy, presumed to be reactive.  4. Cannot exclude mild pancreatitis involving the head and neck.  5. Hepatic steatosis.  6. Fat containing ventral abdominal wall hernias.  7. Similar left adrenal nodule. Most likely an adenoma.  Technically indeterminate.  8. 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  The patient continues to feel very tired, weak and having pain with some nausea.  She is very frustrated with her condition.  We have been consulted to give surgical opinion on her situation and make further recommendations.    Past Medical History  Diagnosis Date  . Hernia 03-24-13    ventral hernia remains   . GERD (gastroesophageal reflux disease)   . Pancreatitis 03-24-13    2002,  1'6109, 03-14-13  . Pancreatic cyst 2002 onset  . Anxiety   . Depression     "recent breakup with partner of 15 yrs"    Past Surgical History  Procedure Laterality Date  . Abdominal surgery  ~ 2007    Sounds like SB to pancrease anastomosis to drain cyst  . Cholecystectomy      ~ age 72-laparoscopic  . Adenoidectomy    . Abdominal surgery      stent to pancreatic cyst that became infected within 36 hours  . Eus N/A  04/14/2013    Procedure: UPPER ENDOSCOPIC ULTRASOUND (EUS) LINEAR;  Surgeon: Rachael Fee, MD;  Location: [facility];  Service: Endoscopy;  Laterality: N/A;    Family History  Problem Relation Age of Onset  . Lupus Neg Hx   . Sarcoidosis Neg Hx   . Pancreatitis Neg Hx   . Colitis Maternal Aunt   . Diabetes Mother   . Diabetes Father   . Diabetes      many family members    Social History:  reports that she has been smoking Cigarettes.  She has a 10 pack-year smoking history. She has never used smokeless tobacco. She reports that she does not drink alcohol or use illicit drugs.  Allergies:  Allergies  Allergen Reactions  . Penicillins Anaphylaxis  . Latex Itching  . Morphine And Related Nausea And Vomiting    Medications: I have reviewed the patient's current medications.  Results for orders placed during the hospital encounter of 04/14/13 (from the past 48 hour(s))  BASIC METABOLIC PANEL     Status: Abnormal   Collection Time    04/19/13  4:30 AM      Result Value Range   Sodium 138  135 - 145 mEq/L   Potassium 4.0  3.5 - 5.1 mEq/L   Chloride 104  96 - 112 mEq/L   CO2 23  19 - 32 mEq/L   Glucose, Bld 67 (*) 70 - 99 mg/dL   BUN 3 (*) 6 - 23 mg/dL   Creatinine, Ser 6.04  0.50 - 1.10 mg/dL   Calcium 9.0  8.4 - 54.0 mg/dL   GFR calc non Af Amer >90  >90 mL/min   GFR calc Af Amer >90  >90 mL/min   Comment: (NOTE)     The eGFR has been calculated using the CKD EPI equation.     This calculation has not been validated in all clinical situations.     eGFR's persistently <90 mL/min signify possible Chronic Kidney     Disease.    Ct Abdomen Pelvis W Contrast  04/19/2013   *RADIOLOGY REPORT*  Clinical Data: History of recurrent pancreatitis.  Stent placement in 2007.  CT ABDOMEN AND PELVIS WITH CONTRAST  Technique:  Multidetector CT imaging of the abdomen and pelvis was performed following the standard protocol during bolus administration of intravenous contrast.  Contrast:  25mL OMNIPAQUE IOHEXOL 300 MG/ML  SOLN, OMNIPAQUE IOHEXOL 300 MG/ML  SOLN  Comparison: Acute abdomen series 04/14/2013.  CT 03/15/2013.  Findings: Lung bases:  Clear lung bases.  Normal heart size without pericardial or pleural effusion.  Small para esophageal nodes which are likely reactive.  Abdomen/pelvis:  Moderate hepatic steatosis.  Normal spleen, stomach.  Pancreatic body/tail junction macrolobulated cystic lesion measures 4.4 x 3.7 cm on image 24/series 2.  Similar to 4.4 x 3.9 cm at the same level on the prior exam.  Peripancreatic fat planes in the region of the neck and head  are somewhat ill-defined.  This could be due to patient body habitus, but mild superimposed acute pancreatitis is difficult to exclude.  No peripancreatic fluid collection or evidence of pancreatic ductal dilatation.  Cholecystectomy without biliary ductal dilatation.  Normal right adrenal gland.  Similar left adrenal nodule of 1.6 cm. Suspect a low density interpolar left renal lesion, most likely a small cyst.  No retroperitoneal or retrocrural adenopathy.  Mildly prominent porta hepatis nodes which are likely reactive and related to steatosis.  Collaterals in the gastroepiploic distribution suggest splenic vein insufficiency, likely in the region of the pancreatic tail.  Normal colon and terminal ileum.  Prior enterotomy.  Otherwise, normal appearance of small bowel loops.  Right external iliac node of 1.3 cm on image 75.  1.0 cm on the prior. More inferior right external iliac node there is  1.4 cm and is not significantly changed.  Normal urinary bladder and uterus, without adnexal mass or significant free pelvic fluid.  Fat containing ventral abdominal wall hernias.  Bones/Musculoskeletal:  No acute osseous abnormality.  IMPRESSION:  1.  Resolution of previously described terminal ileitis. 2.  Similar macrolobulated cystic lesion in the pancreatic tail/body junction.  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  Cystic pancreatic neoplasm such as mucinous cystadenoma or oligocystic serous cystadenoma could look similar. Imaging surveillance suggested.  This should optimally be performed with pre and post contrast MRI at approximately 6 months. 3.  Slight increase in pelvic adenopathy, presumed to be reactive. 4.  Cannot exclude mild pancreatitis involving the head and neck. 5.  Hepatic steatosis. 6.  Fat containing ventral abdominal wall hernias. 7.  Similar left adrenal nodule.  Most likely an adenoma. Technically indeterminate. 8.  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   Original Report Authenticated By: Jeronimo Greaves, M.D.    Review of Systems  Constitutional: Positive for fever and malaise/fatigue. Negative for chills.  HENT: Negative.   Eyes: Negative.   Respiratory: Negative.   Cardiovascular: Negative.   Gastrointestinal: Positive for nausea, abdominal pain and diarrhea. Negative for constipation.  Genitourinary: Negative.   Musculoskeletal: Negative.   Skin: Negative.   Neurological: Positive for weakness. Negative for dizziness and tingling.  Endo/Heme/Allergies: Negative.   Psychiatric/Behavioral: Negative.    Blood pressure 133/85, pulse 86, temperature 98.8 F (37.1 C), temperature source Oral, resp. rate 16, height 5\' 4"  (1.626 m), weight 245 lb 6 oz (111.3 kg), last menstrual period 02/13/2013, SpO2 97.00%. Physical Exam  Constitutional: She is oriented to person, place, and time. She appears well-developed and well-nourished. No distress.  HENT:  Head: Normocephalic and atraumatic.  Eyes: Conjunctivae are normal. Pupils are equal, round, and reactive to light.  Neck: Normal range of motion. Neck supple.  Cardiovascular: Normal rate and regular rhythm.   Respiratory: Effort normal and breath sounds normal.  distant  GI: Soft. Bowel sounds are normal. She exhibits distension. She exhibits no mass. There is tenderness. There  is no guarding.  Long abdominal scar in LUQ Tender in LUQ Small incisional hernia  Genitourinary:  deferred  Musculoskeletal: Normal range of motion. She exhibits no edema.  Neurological: She is alert and oriented to person, place, and time.  Skin: Skin is warm and dry.  Psychiatric: She has a normal mood and affect. Her behavior is normal. Thought content normal.    Assessment/Plan: Chronic Pancreatitis/Pancreatitic cyst: we will have our pancreatic surgeon, Dr. Almond Lint, review the patient's current records and films, having  the records from [facility] would be very helpful, likely given the complexity level she would need medical stabilization here then transfer or referral to a larger medical center, we will follow up later today  WHITE, Mary [name] 04/20/2013, 9:57 AM

## 2013-04-20 NOTE — Progress Notes (Addendum)
[facility] Progress Note  Subjective:  Does not feel well.  Says that she woke up vomiting this AM.  Still with quite a bit of pain.    Objective:  Vital signs in last 24 hours: Temp:  [97.8 F (36.6 C)-98.8 F (37.1 C)] 98.8 F (37.1 C) (09/03 0641) Pulse Rate:  [66-91] 86 (09/03 0641) Resp:  [16-20] 16 (09/03 0641) BP: (104-133)/(54-85) 133/85 mmHg (09/03 0641) SpO2:  [91 %-99 %] 97 % (09/03 0641) Last BM Date: 04/19/13 General:   Alert, Well-developed, in NAD Heart:  Regular rate and rhythm; no murmurs Pulm:  CTAB.  No W/R/R. Abdomen:  Soft, non-distended.  BS present.  Scar noted from previous surgery in LUQ.  Upper abdominal TTP without R/R/G. Extremities:  Without edema. Neurologic:  Alert and  oriented x4;  grossly normal neurologically. Psych:  Alert and cooperative. Normal mood and affect.  Intake/Output from previous day: 09/02 0701 - 09/03 0700 In: 1531.8 [I.V.:1531.8] Out: 3550 [Urine:3550]  Lab Results:  Recent Labs  04/18/13 0430  WBC 7.0  HGB 10.2*  HCT 31.9*  PLT 183   BMET  Recent Labs  04/18/13 0430 04/19/13 0430  NA 136 138  K 4.0 4.0  CL 105 104  CO2 23 23  GLUCOSE 97 67*  BUN 3* 3*  CREATININE 0.55 0.60  CALCIUM 8.3* 9.0   LFT  Recent Labs  04/18/13 0430  PROT 6.4  ALBUMIN 2.5*  AST 23  ALT 18  ALKPHOS 69  BILITOT 0.4   Ct Abdomen Pelvis W Contrast  04/19/2013   *RADIOLOGY REPORT*  Clinical Data: History of recurrent pancreatitis.  Stent placement in 2007.  CT ABDOMEN AND PELVIS WITH CONTRAST  Technique:  Multidetector CT imaging of the abdomen and pelvis was performed following the standard protocol during bolus administration of intravenous contrast.  Contrast: 25mL OMNIPAQUE IOHEXOL 300 MG/ML  SOLN, OMNIPAQUE IOHEXOL 300 MG/ML  SOLN  Comparison: Acute abdomen series 04/14/2013.  CT 03/15/2013.  Findings: Lung bases:  Clear lung bases.  Normal heart size without pericardial or pleural effusion.  Small para  esophageal nodes which are likely reactive.  Abdomen/pelvis:  Moderate hepatic steatosis.  Normal spleen, stomach.  Pancreatic body/tail junction macrolobulated cystic lesion measures 4.4 x 3.7 cm on image 24/series 2.  Similar to 4.4 x 3.9 cm at the same level on the prior exam.  Peripancreatic fat planes in the region of the neck and head are somewhat ill-defined.  This could be due to patient body habitus, but mild superimposed acute pancreatitis is difficult to exclude.  No peripancreatic fluid collection or evidence of pancreatic ductal dilatation.  Cholecystectomy without biliary ductal dilatation.  Normal right adrenal gland.  Similar left adrenal nodule of 1.6 cm. Suspect a low density interpolar left renal lesion, most likely a small cyst.  No retroperitoneal or retrocrural adenopathy.  Mildly prominent porta hepatis nodes which are likely reactive and related to steatosis.  Collaterals in the gastroepiploic distribution suggest splenic vein insufficiency, likely in the region of the pancreatic tail.  Normal colon and terminal ileum.  Prior enterotomy.  Otherwise, normal appearance of small bowel loops.  Right external iliac node of 1.3 cm on image 75.  1.0 cm on the prior. More inferior right external iliac node there is  1.4 cm and is not significantly changed.  Normal urinary bladder and uterus, without adnexal mass or significant free pelvic fluid.  Fat containing ventral abdominal wall hernias.  Bones/Musculoskeletal:  No acute osseous abnormality.  IMPRESSION:  1.  Resolution of previously described terminal ileitis. 2.  Similar macrolobulated cystic lesion in the pancreatic tail/body junction.  Given history of pancreatitis, most likely a pseudocyst.  Cystic pancreatic neoplasm such as mucinous cystadenoma or oligocystic serous cystadenoma could look similar. Imaging surveillance suggested.  This should optimally be performed with pre and post contrast MRI at approximately 6 months. 3.  Slight  increase in pelvic adenopathy, presumed to be reactive. 4.  Cannot exclude mild pancreatitis involving the head and neck. 5.  Hepatic steatosis. 6.  Fat containing ventral abdominal wall hernias. 7.  Similar left adrenal nodule.  Most likely an adenoma. Technically indeterminate. 8.  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   Original Report Authenticated By: Jeronimo Greaves, M.D.    Assessment / Plan: -Abdominal pain with nausea and vomiting post EUS with FNA of pancreatic cyst.  CT scan shows possible mild pancreatitis.  Lipase has returned to normal. -Pancreatic cyst:  Has re-accumulated already as seen on CT scan. -Diarrhea x 2 months: Had terminal ileitis on CT scan in July but was suspected to be infectious and was treated with cipro and flagyl. No further diarrhea in fasting state. Terminal ileitis resolved on repeat CT scan.  ? If diarrhea is secondary to pancreatic insufficiency.  *Surgical consult to evaluate for resection of cystic lesion and surrounding pancreas. *Continue conservative management for now with IVF's, pain control, and anti-emetics.  Will give ice chips. *When she is taking PO then will restart pancreatic enzymes but at higher dose (she was on 6000 units three times a day with each meal, but says that they did not seem to be helping with the diarrhea; this may not have been an adequate dose for her).  Consider checking fecal elastase when she begins eating as well.    LOS: 6 days   ZEHR, JESSICA D.  04/20/2013, 8:57 AM  Pager number [phone]  [facility] Attending  I have also seen and assessed the patient and agree with the above note. Per Dr. Christella Hartigan cyst fluid was high amylase and low CEA. Appreciate G[facility] input and agree that surgery could be complex. I am not certain she has exocrine insufficiency - she may. We can try pancreatic enzymes but don't think she could afford them unless there is a patient assistance program (might  be). Will check fecal elastase (tests for pancreatic insufficiency)  Iva Boop, MD, [facility] [phone] (pager) 04/20/2013 3:11 PM

## 2013-04-20 NOTE — Progress Notes (Signed)
I spoke with Dr. Donell Beers and she recommends definitive treatment at a tertiary center.  Dr. Sharyn Dross at [facility] or Dr. Rise Mu at [facility].

## 2013-04-20 NOTE — Consult Note (Signed)
Patient seen and examined.  Chart and CT reviewed.  She has a complex cystic lesion in the body of the pancreas extending toward the tail.  Studies on the fluid have been sent to an outside lab.  She is s/p cyst-jejunostomy (head of pancreas by her report) at [facility] many years ago.   She was symptom free for 18 months then began having recurring bouts of pancreatitis.  She has exocrine insufficiency.  She has a palpable incisional hernia also noted on CT.  She may end up needing a total or near total pancreatectomy.  Will discuss this with Dr. Donell Beers.  She may end up needing to go to a tertiary surgery for her redo pancreatic surgery.

## 2013-04-20 NOTE — Progress Notes (Signed)
Faxed medical release form to [facility] medical center requesting Medical records. I contacted them and they did receive request. Currently awaiting records.

## 2013-04-20 NOTE — Progress Notes (Signed)
[facility] PROGRESS NOTE  Mary [name] ZOX:[id number] DOB: October 13, 1975 DOA: 04/14/2013 PCP: Dorrene German, MD  HPI/Brief narrative 37 year old female with history of recurrent pancreatitis and pancreatic cyst of unknown etiology, post cholecystectomy, does not drink alcohol, underwent extensive evaluation by GI and surgery at [facility] view and underwent pancreatic stent endoscopically which was removed 36 hours later because it was infected, then underwent open surgery for? Small bowel to pancreas anastomosis to drain the cyst. Despite this she continued to have episodes of pancreatitis. Recently hospitalized July-August for pancreatitis and terminal ileitis with diarrhea. Discharge home on Cipro/Flagyl. Continued to have diarrhea. Underwent endoscopic ultrasound and biopsy of pancreatic cyst by Dr. Christella Hartigan on 8/28-16 mL of thick clear fluid removed-not sent for cultures. Admitted post procedure for worsening abdominal pain, elevated lipase. [facility] has consulted and requested surgical consult on 9/3.  Assessment/Plan:  Acute pancreatitis post EUS and FNA of pancreatic cyst, complicating chronic pancreatitis/pancreatic cyst. - Patient continues to have nausea and abdominal pain. Continue symptomatic treatment-IV fluids, pain control and antiemetics. - GI consultation appreciated. Start by mouth pancreatic enzymes when able to tolerate. - Surgical consultation appreciated-awaiting further recommendations by pancreatic surgeon pending medical records from [facility] view. May require transfer to tertiary facility. - No malignant cells on cytology.  Diarrhea x2 months/terminal ileitis - Had terminal ileitis on CT in July which was suspected to be infectious and completed treatment with Cipro and Flagyl. Still having intermittent diarrhea. - Terminal ileitis resolved on repeat CT this admission.  Anemia - No active bleeding. Follow CBCs.? Dilutional  Severe protein calorie  malnutrition - Management per nutritionist. Advance diet when tolerated.  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 Outpatient followup as deemed necessary.  DVT prophylaxis: Lovenox Lines/catheters: PIV Nutrition: N.p.o.  Code Status: Full Family Communication: None Disposition Plan: Home when medically stable   Consultants:  [facility]  General surgery  Procedures:  None  Antibiotics:  None   Subjective: Continues to have significant abdominal pain and nausea. No vomiting. An episode of diarrhea yesterday.  Objective: Filed Vitals:   04/19/13 1401 04/19/13 2139 04/20/13 0545 04/20/13 0641  BP: 128/65 114/77 104/64 133/85  Pulse:  88 66 86  Temp:  98.1 F (36.7 C) 98.1 F (36.7 C) 98.8 F (37.1 C)  TempSrc:  Oral Oral Oral  Resp:  18 16 16   Height:      Weight:      SpO2:  91% 93% 97%    Intake/Output Summary (Last 24 hours) at 04/20/13 1353 Last data filed at 04/20/13 0600  Gross per 24 hour  Intake 1220.58 ml  Output   2350 ml  Net -1129.42 ml   Filed Weights   04/14/13 2100  Weight: 111.3 kg (245 lb 6 oz)     Exam:  General exam: Moderately built and obese female patient sitting up in bed comfortably. Respiratory system: Clear. No increased work of breathing. Cardiovascular system: S1 & S2 heard, RRR. No JVD, murmurs, gallops, clicks or pedal edema. Gastrointestinal system: Abdomen is nondistended, soft and mild LUQ tenderness without rigidity, guarding or rebound. Normal bowel sounds heard. Central nervous system: Alert and oriented. No focal neurological deficits. Extremities: Symmetric 5 x 5 power.   Data Reviewed: Basic Metabolic Panel:  Recent Labs Lab 04/15/13 0400 04/16/13 0415 04/17/13 0504 04/18/13 0430 04/19/13 0430  NA 137 132* 133* 136 138  K 3.8 3.9 4.1 4.0 4.0  CL 107  100 103 105 104  CO2 23 23 22 23 23   GLUCOSE 112* 100* 101* 97 67*  BUN 5*  4* 3* 3* 3*  CREATININE 0.55 0.56 0.57 0.55 0.60  CALCIUM 8.5 8.8 8.5 8.3* 9.0   Liver Function Tests:  Recent Labs Lab 04/14/13 1859 04/15/13 0400 04/16/13 0415 04/18/13 0430  AST 20 18 19 23   ALT 24 21 22 18   ALKPHOS 78 73 89 69  BILITOT 0.1* 0.1* 0.4 0.4  PROT 7.2 6.5 7.4 6.4  ALBUMIN 3.2* 2.9* 3.1* 2.5*    Recent Labs Lab 04/14/13 1859 04/15/13 0400 04/16/13 0415 04/17/13 0504 04/18/13 0430  LIPASE 638* 903* 934* 28 11  AMYLASE 150*  --   --   --   --    No results found for this basename: AMMONIA,  in the last 168 hours CBC:  Recent Labs Lab 04/14/13 1859 04/15/13 0400 04/16/13 0415 04/17/13 0504 04/18/13 0430  WBC 10.4 8.6 11.0* 9.5 7.0  NEUTROABS 6.4  --   --   --   --   HGB 12.7 11.3* 12.2 11.2* 10.2*  HCT 38.8 35.5* 38.0 33.8* 31.9*  MCV 88.2 89.6 88.6 86.9 88.1  PLT 267 248 225 174 183   Cardiac Enzymes: No results found for this basename: CKTOTAL, CKMB, CKMBINDEX, TROPONINI,  in the last 168 hours BNP (last 3 results) No results found for this basename: PROBNP,  in the last 8760 hours CBG: No results found for this basename: GLUCAP,  in the last 168 hours  Recent Results (from the past 240 hour(s))  SURGICAL PCR SCREEN     Status: None   Collection Time    04/15/13 12:56 AM      Result Value Range Status   MRSA, PCR NEGATIVE  NEGATIVE Final   Staphylococcus aureus NEGATIVE  NEGATIVE Final   Comment:            The Xpert SA Assay (FDA     approved for NASAL specimens     in patients over 76 years of age),     is one component of     a comprehensive surveillance     program.  Test performance has     been validated by [employer] for patients greater     than or equal to 25 year old.     It is not intended     to diagnose infection nor to     guide or monitor treatment.      Additional labs: 1. Diagnosis FINE NEEDLE ASPIRATION, ENDOSCOPIC, PANCREAS CYST FLUID: NO MALIGNANT CELLS IDENTIFIED. FINDINGS CONSISTENT WITH THE  CONTENTS OF A CYST.     Studies: Ct Abdomen Pelvis W Contrast  04/19/2013   *RADIOLOGY REPORT*  Clinical Data: History of recurrent pancreatitis.  Stent placement in 2007.  CT ABDOMEN AND PELVIS WITH CONTRAST  Technique:  Multidetector CT imaging of the abdomen and pelvis was performed following the standard protocol during bolus administration of intravenous contrast.  Contrast: 25mL OMNIPAQUE IOHEXOL 300 MG/ML  SOLN, OMNIPAQUE IOHEXOL 300 MG/ML  SOLN  Comparison: Acute abdomen series 04/14/2013.  CT 03/15/2013.  Findings: Lung bases:  Clear lung bases.  Normal heart size without pericardial or pleural effusion.  Small para esophageal nodes which are likely reactive.  Abdomen/pelvis:  Moderate hepatic steatosis.  Normal spleen, stomach.  Pancreatic body/tail junction macrolobulated cystic lesion measures 4.4 x 3.7 cm on image 24/series 2.  Similar to 4.4 x 3.9 cm  at the same level on the prior exam.  Peripancreatic fat planes in the region of the neck and head are somewhat ill-defined.  This could be due to patient body habitus, but mild superimposed acute pancreatitis is difficult to exclude.  No peripancreatic fluid collection or evidence of pancreatic ductal dilatation.  Cholecystectomy without biliary ductal dilatation.  Normal right adrenal gland.  Similar left adrenal nodule of 1.6 cm. Suspect a low density interpolar left renal lesion, most likely a small cyst.  No retroperitoneal or retrocrural adenopathy.  Mildly prominent porta hepatis nodes which are likely reactive and related to steatosis.  Collaterals in the gastroepiploic distribution suggest splenic vein insufficiency, likely in the region of the pancreatic tail.  Normal colon and terminal ileum.  Prior enterotomy.  Otherwise, normal appearance of small bowel loops.  Right external iliac node of 1.3 cm on image 75.  1.0 cm on the prior. More inferior right external iliac node there is  1.4 cm and is not significantly changed.  Normal  urinary bladder and uterus, without adnexal mass or significant free pelvic fluid.  Fat containing ventral abdominal wall hernias.  Bones/Musculoskeletal:  No acute osseous abnormality.  IMPRESSION:  1.  Resolution of previously described terminal ileitis. 2.  Similar macrolobulated cystic lesion in the pancreatic tail/body junction.  Given history of pancreatitis, most likely a pseudocyst.  Cystic pancreatic neoplasm such as mucinous cystadenoma or oligocystic serous cystadenoma could look similar. Imaging surveillance suggested.  This should optimally be performed with pre and post contrast MRI at approximately 6 months. 3.  Slight increase in pelvic adenopathy, presumed to be reactive. 4.  Cannot exclude mild pancreatitis involving the head and neck. 5.  Hepatic steatosis. 6.  Fat containing ventral abdominal wall hernias. 7.  Similar left adrenal nodule.  Most likely an adenoma. Technically indeterminate. 8.  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   Original Report Authenticated By: Jeronimo Greaves, M.D.        Scheduled Meds: . enoxaparin (LOVENOX) injection  40 mg Subcutaneous Q24H  . pantoprazole (PROTONIX) IV  40 mg Intravenous Daily   Continuous Infusions: . dextrose 5 % and 0.9 % NaCl with KCl 20 mEq/L 80 mL/hr at 04/20/13 1114    Principal Problem:   Pancreatitis, acute Active Problems:   Obesity, morbid   Pancreatic cyst   history of recurrent ideopathic pancreatitis   Terminal ileitis   Protein-calorie malnutrition, severe    Time spent: 35 minutes    [facility]  [facility] Pager [phone].   If 8PM-8AM, please contact night-coverage at [email or web address], password [email or web address] 04/20/2013, 1:53 PM  LOS: 6 days

## 2013-04-21 DIAGNOSIS — R197 Diarrhea, unspecified: Secondary | ICD-10-CM

## 2013-04-21 LAB — CREATININE, SERUM
Creatinine, Ser: 0.54 mg/dL (ref 0.50–1.10)
GFR calc Af Amer: >90 mL/min (ref >90)

## 2013-04-21 LAB — CBC
HCT: 33.2 % — ABNORMAL LOW (ref 36.0–46.0)
MCHC: 32.2 g/dL (ref 30.0–36.0)
MCV: 87.8 fL (ref 78.0–100.0)
Platelets: 264 10*3/uL (ref 150–400)
RDW: 14.2 % (ref 11.5–15.5)

## 2013-04-21 LAB — GLUCOSE, CAPILLARY
Glucose-Capillary: 101 mg/dL — ABNORMAL HIGH (ref 70–99)
Glucose-Capillary: 105 mg/dL — ABNORMAL HIGH (ref 70–99)
Glucose-Capillary: 89 mg/dL (ref 70–99)

## 2013-04-21 MED ORDER — INSULIN ASPART 100 UNIT/ML ~~LOC~~ SOLN
0.0000 [IU] | SUBCUTANEOUS | Status: DC
  Administered 2013-04-24: 1 [IU] via SUBCUTANEOUS

## 2013-04-21 MED ORDER — FAT EMULSION 20 % IV EMUL
250.0000 mL | INTRAVENOUS | Status: AC
  Administered 2013-04-21: 250 mL via INTRAVENOUS
  Filled 2013-04-21: qty 250

## 2013-04-21 MED ORDER — TRACE MINERALS CR-CU-F-FE-I-MN-MO-SE-ZN IV SOLN
INTRAVENOUS | Status: AC
  Administered 2013-04-21: 20:00:00 via INTRAVENOUS
  Filled 2013-04-21: qty 1000

## 2013-04-21 MED ORDER — KCL IN DEXTROSE-NACL 20-5-0.9 MEQ/L-%-% IV SOLN
INTRAVENOUS | Status: AC
  Administered 2013-04-22: 05:00:00 via INTRAVENOUS
  Filled 2013-04-21: qty 1000

## 2013-04-21 MED ORDER — TRACE MINERALS CR-CU-F-FE-I-MN-MO-SE-ZN IV SOLN
INTRAVENOUS | Status: DC
  Filled 2013-04-21: qty 1000

## 2013-04-21 MED ORDER — SCOPOLAMINE 1 MG/3DAYS TD PT72
1.0000 | MEDICATED_PATCH | TRANSDERMAL | Status: DC
Start: 2013-04-21 — End: 2013-04-21
  Administered 2013-04-21: 1.5 mg via TRANSDERMAL
  Filled 2013-04-21: qty 1

## 2013-04-21 MED ORDER — SCOPOLAMINE 1 MG/3DAYS TD PT72
1.0000 | MEDICATED_PATCH | TRANSDERMAL | Status: DC
  Administered 2013-04-21 – 2013-04-24 (×2): 1.5 mg via TRANSDERMAL
  Filled 2013-04-21 (×2): qty 1

## 2013-04-21 NOTE — Progress Notes (Signed)
NUTRITION FOLLOW UP  Intervention:   - TPN per pharmacy - Diet advancement per MD - Anti-emetics per MD - Will continue to monitor   Nutrition Dx:   Inadequate oral intake related to inability to eat as evidenced by NPO - ongoing but now related to clear liquid diet as evidenced by diet order.    Goal:   Advance diet as tolerated to low fat, bland diet - not met  New goal: TPN to meet >90% of estimated nutritional needs   Monitor:   Weights, labs, diet advancement, TPN, nausea/vomiting  Assessment:   Noted events since last week with ongoing vomiting including vomiting apple juice last night. Pt with diarrhea since yesterday almost every half hour. Met with pt who c/o only minimal nausea and no vomiting today. Pt with ongoing abdominal pain. Noted GI's report this morning with results from pancreatic cyst fluid showing amylase of 163,087 U/L (extremely high). Noted lipase, AST/ALT all WNL. Last triglyceride level was on 8/28 and it was 349 mg/dL (high). Plan is to start TPN today per surgeon's notes. No new weights.     Height: Ht Readings from Last 1 Encounters:  04/14/13 5\' 4"  (1.626 m)    Weight Status:   Wt Readings from Last 1 Encounters:  04/14/13 245 lb 6 oz (111.3 kg)    Re-estimated needs:  Kcal: 1650-1850  Protein: 80-100g  Fluid: 1.6-1.8L/day  Skin: Intact  Diet Order: Clear Liquid   Intake/Output Summary (Last 24 hours) at 04/21/13 1056 Last data filed at 04/21/13 1042  Gross per 24 hour  Intake   2798 ml  Output   1802 ml  Net    996 ml    Last BM: 9/3   Labs:   Recent Labs Lab 04/17/13 0504 04/18/13 0430 04/19/13 0430 04/21/13 0505  NA 133* 136 138  --   K 4.1 4.0 4.0  --   CL 103 105 104  --   CO2 22 23 23   --   BUN 3* 3* 3*  --   CREATININE 0.57 0.55 0.60 0.54  CALCIUM 8.5 8.3* 9.0  --   GLUCOSE 101* 97 67*  --     CBG (last 3)   Recent Labs  04/20/13 2324 04/21/13 0357 04/21/13 0723  GLUCAP 116* 101* 105*     Scheduled Meds: . enoxaparin (LOVENOX) injection  40 mg Subcutaneous Q24H  . lipase/protease/amylase  2 capsule Oral TID AC  . pantoprazole (PROTONIX) IV  40 mg Intravenous Daily  . scopolamine  1 patch Transdermal Q72H    Continuous Infusions: . dextrose 5 % and 0.9 % NaCl with KCl 20 mEq/L 80 mL/hr at 04/20/13 2318    Levon Hedger MS, RD, LDN [phone] Pager [phone] After Hours Pager

## 2013-04-21 NOTE — Progress Notes (Addendum)
PARENTERAL NUTRITION CONSULT NOTE - INITIAL  Pharmacy Consult for TNA Indication: Pancreatitis, bowel rest  Allergies  Allergen Reactions  . Penicillins Anaphylaxis  . Latex Itching  . Morphine And Related Nausea And Vomiting   Patient Measurements: Height: 5\' 4"  (162.6 cm) Weight: 245 lb 6 oz (111.3 kg) IBW/kg (Calculated) : 54.7  Vital Signs: Temp: 97.6 F (36.4 C) (09/04 1800) Temp src: Oral (09/04 1800) BP: 118/75 mmHg (09/04 1800) Pulse Rate: 80 (09/04 1800) Intake/Output from previous day: 09/03 0701 - 09/04 0700 In: 2930 [P.O.:600; I.V.:2330] Out: 1302 [Urine:1300; Stool:2] Intake/Output from this shift: Total I/O In: 1128 [P.O.:180; I.V.:948] Out: 700 [Urine:700]  Labs:  Recent Labs  04/21/13 0505  WBC 6.6  HGB 10.7*  HCT 33.2*  PLT 264     Recent Labs  04/19/13 0430 04/21/13 0505  NA 138  --   K 4.0  --   CL 104  --   CO2 23  --   GLUCOSE 67*  --   BUN 3*  --   CREATININE 0.60 0.54  CALCIUM 9.0  --    Estimated Creatinine Clearance: 119.8 ml/min (by C-G formula based on Cr of 0.54).    Recent Labs  04/21/13 0723 04/21/13 1201 04/21/13 1605  GLUCAP 105* 86 89   Medical History: Past Medical History  Diagnosis Date  . Hernia 03-24-13    ventral hernia remains   . GERD (gastroesophageal reflux disease)   . Pancreatitis 03-24-13    2002, 2'2013, 03-14-13  . Pancreatic cyst 2002 onset  . Anxiety   . Depression     "recent breakup with partner of 15 yrs"   Medications:  Scheduled:  . enoxaparin (LOVENOX) injection  40 mg Subcutaneous Q24H  . lipase/protease/amylase  2 capsule Oral TID AC  . pantoprazole (PROTONIX) IV  40 mg Intravenous Daily  . scopolamine  1 patch Transdermal Q72H   Infusions:  . dextrose 5 % and 0.9 % NaCl with KCl 20 mEq/L 80 mL/hr at 04/21/13 1220  . Marland KitchenTPN (CLINIMIX-E) Adult     And  . fat emulsion      Insulin Requirements in the past 24 hours:  None; no hx DM. CBG's ordered q4hr by MD. Will add Sensitive  Scale Novolog.  Current Nutrition:  NPO  Assessment: 36 yoF admit 8/28 with hx Pancreatitis, pancreatic cyst with previous stent placement-removal and anastomosis with small bowel ( at [facility] for cyst drainage). Unable to tolerate po, begin TNA for bowel rest. Possible transfer to [facility], no surgical intervention necessary.  Single lumen PICC changed to DL PICC today  Begin TNA tonight 9/4, reduce IV rate of D5NS with 20K/L to 40 ml/hr  Clinimix E 5/15 at goal rate of 75 ml/hr will deliver 90 gm protein, 1650 kCal per 24 hr  Nutritional Goals:  1650-1850 kCal, 80-100 grams of protein per day per RD  Plan:   Begin TNA with Clinimix E 5/20 at 40 ml/hr  Lipids, Multivitamin, and Trace elements daily  Full TNA labs Mon,Thurs  Otho Bellows PharmD Pager [phone] 04/21/2013, 7:02 PM

## 2013-04-21 NOTE — Progress Notes (Signed)
Pancreatic cyst fluid testing results back:  Amylase 163,087 U/L  (very high) CEA 3.8ng/nL  (very low)  These results indicate the cyst is either a pancreatic pseudocyst or at least a process that is not malignant or premalignant. It does seem that her pancreas is sensitive (EUS FNA usually does not cause acute pancreatitis) and I agree that she should be seen at tertiary pancreatic center to consider resection.

## 2013-04-21 NOTE — Progress Notes (Signed)
Patient seen and examined.  Agree with PA's note. She is not able to take liquids without increasing pain or nausea.  Will start TPN.  She will likely need to go home on TPN until she can be seen at the tertiary center.

## 2013-04-21 NOTE — Progress Notes (Signed)
[facility] PROGRESS NOTE  Brylyn Novakovich [place] WUJ:[id number] DOB: 1975-09-02 DOA: 04/14/2013 PCP: Dorrene German, MD  HPI/Brief narrative 37 year old female with history of recurrent pancreatitis and pancreatic cyst of unknown etiology, post cholecystectomy, does not drink alcohol, underwent extensive evaluation by GI and surgery at [facility] view and underwent pancreatic stent endoscopically which was removed 36 hours later because it was infected, then underwent open surgery for? Small bowel to pancreas anastomosis to drain the cyst. Despite this she continued to have episodes of pancreatitis. Recently hospitalized July-August for pancreatitis and terminal ileitis with diarrhea. Discharge home on Cipro/Flagyl. Continued to have diarrhea. Underwent endoscopic ultrasound and biopsy of pancreatic cyst by Dr. Christella Hartigan on 8/28-16 mL of thick clear fluid removed-not sent for cultures. Admitted post procedure for worsening abdominal pain, elevated lipase. [facility] has consulted and requested surgical consult on 9/3.  Assessment/Plan:  Acute pancreatitis post EUS and FNA of pancreatic cyst, complicating chronic pancreatitis/pancreatic cyst. - Patient continues to have nausea and abdominal pain. Continue symptomatic treatment-IV fluids, pain control and antiemetics. - GI consultation appreciated. Start by mouth pancreatic enzymes when able to tolerate. - Surgeons and GI consultation and followup appreciated. They recommend nonemergent outpatient evaluation at tertiary care center/Dr. Rise Mu at [facility] - No malignant cells on cytology. - Patient unable to tolerate oral intake without significant pain, nausea or vomiting. TPN being started by surgeons.  Diarrhea x2 months/terminal ileitis - Had terminal ileitis on CT in July which was suspected to be infectious and completed treatment with Cipro and Flagyl. Still having intermittent diarrhea. - Terminal ileitis resolved on repeat CT this  admission.  Anemia - No active bleeding. Stable.  Severe protein calorie malnutrition - Management per nutritionist.  - Unable to tolerate by mouth. Starting TPN.  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 Outpatient followup as deemed necessary.  DVT prophylaxis: Lovenox Lines/catheters: PICC Nutrition: N.p.o.  Code Status: Full Family Communication: None Disposition Plan: Home possibly in next 1-2 days on TPN and OP consultation at [facility]   Consultants:  [facility]  General surgery  Procedures:  PICC  Antibiotics:  None   Subjective: States that she had watery diarrhea almost every half hour, 2 episodes of vomiting last night, persistent nausea and ongoing abdominal pain.  Objective: Filed Vitals:   04/20/13 2125 04/21/13 0533 04/21/13 0900 04/21/13 1325  BP: 127/83 100/67 101/61 113/57  Pulse: 82 81 68 70  Temp: 99 F (37.2 C) 97.9 F (36.6 C) 98.6 F (37 C) 98 F (36.7 C)  TempSrc: Oral Oral Oral Oral  Resp: 18 18  16   Height:      Weight:      SpO2: 97% 97% 96% 96%    Intake/Output Summary (Last 24 hours) at 04/21/13 1506 Last data filed at 04/21/13 1400  Gross per 24 hour  Intake   3098 ml  Output   1602 ml  Net   1496 ml   Filed Weights   04/14/13 2100  Weight: 111.3 kg (245 lb 6 oz)     Exam:  General exam: Moderately built and obese female patient sitting up in bed comfortably. Respiratory system: Clear. No increased work of breathing. Cardiovascular system: S1 & S2 heard, RRR. No JVD, murmurs, gallops, clicks or pedal edema. Gastrointestinal system: Abdomen is nondistended, soft and mild lower abdominal tenderness without rigidity, guarding or rebound. Normal bowel sounds heard. Central nervous system: Alert and oriented. No focal neurological deficits.  Extremities: Symmetric 5 x 5 power.   Data Reviewed: Basic Metabolic Panel:  Recent Labs Lab  04/15/13 0400 04/16/13 0415 04/17/13 0504 04/18/13 0430 04/19/13 0430 04/21/13 0505  NA 137 132* 133* 136 138  --   K 3.8 3.9 4.1 4.0 4.0  --   CL 107 100 103 105 104  --   CO2 23 23 22 23 23   --   GLUCOSE 112* 100* 101* 97 67*  --   BUN 5* 4* 3* 3* 3*  --   CREATININE 0.55 0.56 0.57 0.55 0.60 0.54  CALCIUM 8.5 8.8 8.5 8.3* 9.0  --    Liver Function Tests:  Recent Labs Lab 04/14/13 1859 04/15/13 0400 04/16/13 0415 04/18/13 0430  AST 20 18 19 23   ALT 24 21 22 18   ALKPHOS 78 73 89 69  BILITOT 0.1* 0.1* 0.4 0.4  PROT 7.2 6.5 7.4 6.4  ALBUMIN 3.2* 2.9* 3.1* 2.5*    Recent Labs Lab 04/14/13 1859 04/15/13 0400 04/16/13 0415 04/17/13 0504 04/18/13 0430  LIPASE 638* 903* 934* 28 11  AMYLASE 150*  --   --   --   --    No results found for this basename: AMMONIA,  in the last 168 hours CBC:  Recent Labs Lab 04/14/13 1859 04/15/13 0400 04/16/13 0415 04/17/13 0504 04/18/13 0430 04/21/13 0505  WBC 10.4 8.6 11.0* 9.5 7.0 6.6  NEUTROABS 6.4  --   --   --   --   --   HGB 12.7 11.3* 12.2 11.2* 10.2* 10.7*  HCT 38.8 35.5* 38.0 33.8* 31.9* 33.2*  MCV 88.2 89.6 88.6 86.9 88.1 87.8  PLT 267 248 225 174 183 264   Cardiac Enzymes: No results found for this basename: CKTOTAL, CKMB, CKMBINDEX, TROPONINI,  in the last 168 hours BNP (last 3 results) No results found for this basename: PROBNP,  in the last 8760 hours CBG:  Recent Labs Lab 04/20/13 2002 04/20/13 2324 04/21/13 0357 04/21/13 0723 04/21/13 1201  GLUCAP 98 116* 101* 105* 86    Recent Results (from the past 240 hour(s))  SURGICAL PCR SCREEN     Status: None   Collection Time    04/15/13 12:56 AM      Result Value Range Status   MRSA, PCR NEGATIVE  NEGATIVE Final   Staphylococcus aureus NEGATIVE  NEGATIVE Final   Comment:            The Xpert SA Assay (FDA     approved for NASAL specimens     in patients over 3 years of age),     is one component of     a comprehensive surveillance     program.   Test performance has     been validated by [employer] for patients greater     than or equal to 91 year old.     It is not intended     to diagnose infection nor to     guide or monitor treatment.      Additional labs: 1. Diagnosis FINE NEEDLE ASPIRATION, ENDOSCOPIC, PANCREAS CYST FLUID: NO MALIGNANT CELLS IDENTIFIED. FINDINGS CONSISTENT WITH THE CONTENTS OF A CYST.     Studies: Ct Abdomen Pelvis W Contrast  04/19/2013   *RADIOLOGY REPORT*  Clinical Data: History of recurrent pancreatitis.  Stent placement in 2007.  CT ABDOMEN AND PELVIS WITH CONTRAST  Technique:  Multidetector CT imaging of the abdomen and pelvis was performed following the standard protocol during bolus  administration of intravenous contrast.  Contrast: 25mL OMNIPAQUE IOHEXOL 300 MG/ML  SOLN, OMNIPAQUE IOHEXOL 300 MG/ML  SOLN  Comparison: Acute abdomen series 04/14/2013.  CT 03/15/2013.  Findings: Lung bases:  Clear lung bases.  Normal heart size without pericardial or pleural effusion.  Small para esophageal nodes which are likely reactive.  Abdomen/pelvis:  Moderate hepatic steatosis.  Normal spleen, stomach.  Pancreatic body/tail junction macrolobulated cystic lesion measures 4.4 x 3.7 cm on image 24/series 2.  Similar to 4.4 x 3.9 cm at the same level on the prior exam.  Peripancreatic fat planes in the region of the neck and head are somewhat ill-defined.  This could be due to patient body habitus, but mild superimposed acute pancreatitis is difficult to exclude.  No peripancreatic fluid collection or evidence of pancreatic ductal dilatation.  Cholecystectomy without biliary ductal dilatation.  Normal right adrenal gland.  Similar left adrenal nodule of 1.6 cm. Suspect a low density interpolar left renal lesion, most likely a small cyst.  No retroperitoneal or retrocrural adenopathy.  Mildly prominent porta hepatis nodes which are likely reactive and related to steatosis.  Collaterals in the gastroepiploic  distribution suggest splenic vein insufficiency, likely in the region of the pancreatic tail.  Normal colon and terminal ileum.  Prior enterotomy.  Otherwise, normal appearance of small bowel loops.  Right external iliac node of 1.3 cm on image 75.  1.0 cm on the prior. More inferior right external iliac node there is  1.4 cm and is not significantly changed.  Normal urinary bladder and uterus, without adnexal mass or significant free pelvic fluid.  Fat containing ventral abdominal wall hernias.  Bones/Musculoskeletal:  No acute osseous abnormality.  IMPRESSION:  1.  Resolution of previously described terminal ileitis. 2.  Similar macrolobulated cystic lesion in the pancreatic tail/body junction.  Given history of pancreatitis, most likely a pseudocyst.  Cystic pancreatic neoplasm such as mucinous cystadenoma or oligocystic serous cystadenoma could look similar. Imaging surveillance suggested.  This should optimally be performed with pre and post contrast MRI at approximately 6 months. 3.  Slight increase in pelvic adenopathy, presumed to be reactive. 4.  Cannot exclude mild pancreatitis involving the head and neck. 5.  Hepatic steatosis. 6.  Fat containing ventral abdominal wall hernias. 7.  Similar left adrenal nodule.  Most likely an adenoma. Technically indeterminate. 8.  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   Original Report Authenticated By: Jeronimo Greaves, M.D.        Scheduled Meds: . enoxaparin (LOVENOX) injection  40 mg Subcutaneous Q24H  . lipase/protease/amylase  2 capsule Oral TID AC  . pantoprazole (PROTONIX) IV  40 mg Intravenous Daily  . scopolamine  1 patch Transdermal Q72H   Continuous Infusions: . dextrose 5 % and 0.9 % NaCl with KCl 20 mEq/L 80 mL/hr at 04/21/13 1220    Principal Problem:   Pancreatitis, acute Active Problems:   Obesity, morbid   Pancreatic cyst   history of recurrent ideopathic pancreatitis   Terminal  ileitis   Protein-calorie malnutrition, severe   Anemia   Diarrhea    Time spent: 25 minutes    [facility]  [facility] Pager [phone].   If 8PM-8AM, please contact night-coverage at [email or web address], password [facility] 04/21/2013, 3:06 PM  LOS: 7 days

## 2013-04-21 NOTE — Progress Notes (Addendum)
[facility] Progress Note  Subjective:  Says that she feels minimally better compared to when she came in one week ago.  Still with abdominal pain, nausea, and vomiting.  Tried to drink some apple juice last night and vomited it back up.  Says that she had diarrhea several times again since yesterday AM.  Objective:  Vital signs in last 24 hours: Temp:  [97.6 F (36.4 C)-99 F (37.2 C)] 97.9 F (36.6 C) (09/04 0533) Pulse Rate:  [74-82] 81 (09/04 0533) Resp:  [18-20] 18 (09/04 0533) BP: (100-127)/(60-83) 100/67 mmHg (09/04 0533) SpO2:  [97 %] 97 % (09/04 0533) Last BM Date: 04/20/13 General:   Alert, Well-developed, in NAD Heart:  Regular rate and rhythm; no murmurs Pulm:  CTAB.  No W/R/R. Abdomen:  Soft, non-distended.  BS present.  Epigastric TTP without R/R/G. Extremities:  Without edema. Neurologic:  Alert and  oriented x4;  grossly normal neurologically. Psych:  Alert and cooperative. Normal mood and affect.  Intake/Output from previous day: 09/03 0701 - 09/04 0700 In: 2930 [P.O.:600; I.V.:2330] Out: 1302 [Urine:1300; Stool:2]  Lab Results:  Recent Labs  04/21/13 0505  WBC 6.6  HGB 10.7*  HCT 33.2*  PLT 264   BMET  Recent Labs  04/19/13 0430 04/21/13 0505  NA 138  --   K 4.0  --   CL 104  --   CO2 23  --   GLUCOSE 67*  --   BUN 3*  --   CREATININE 0.60 0.54  CALCIUM 9.0  --     Ct Abdomen Pelvis W Contrast  04/19/2013   *RADIOLOGY REPORT*  Clinical Data: History of recurrent pancreatitis.  Stent placement in 2007.  CT ABDOMEN AND PELVIS WITH CONTRAST  Technique:  Multidetector CT imaging of the abdomen and pelvis was performed following the standard protocol during bolus administration of intravenous contrast.  Contrast: 25mL OMNIPAQUE IOHEXOL 300 MG/ML  SOLN, OMNIPAQUE IOHEXOL 300 MG/ML  SOLN  Comparison: Acute abdomen series 04/14/2013.  CT 03/15/2013.  Findings: Lung bases:  Clear lung bases.  Normal heart size without pericardial or  pleural effusion.  Small para esophageal nodes which are likely reactive.  Abdomen/pelvis:  Moderate hepatic steatosis.  Normal spleen, stomach.  Pancreatic body/tail junction macrolobulated cystic lesion measures 4.4 x 3.7 cm on image 24/series 2.  Similar to 4.4 x 3.9 cm at the same level on the prior exam.  Peripancreatic fat planes in the region of the neck and head are somewhat ill-defined.  This could be due to patient body habitus, but mild superimposed acute pancreatitis is difficult to exclude.  No peripancreatic fluid collection or evidence of pancreatic ductal dilatation.  Cholecystectomy without biliary ductal dilatation.  Normal right adrenal gland.  Similar left adrenal nodule of 1.6 cm. Suspect a low density interpolar left renal lesion, most likely a small cyst.  No retroperitoneal or retrocrural adenopathy.  Mildly prominent porta hepatis nodes which are likely reactive and related to steatosis.  Collaterals in the gastroepiploic distribution suggest splenic vein insufficiency, likely in the region of the pancreatic tail.  Normal colon and terminal ileum.  Prior enterotomy.  Otherwise, normal appearance of small bowel loops.  Right external iliac node of 1.3 cm on image 75.  1.0 cm on the prior. More inferior right external iliac node there is  1.4 cm and is not significantly changed.  Normal urinary bladder and uterus, without adnexal mass or significant free pelvic fluid.  Fat containing ventral abdominal wall hernias.  Bones/Musculoskeletal:  No acute osseous abnormality.  IMPRESSION:  1.  Resolution of previously described terminal ileitis. 2.  Similar macrolobulated cystic lesion in the pancreatic tail/body junction.  Given history of pancreatitis, most likely a pseudocyst.  Cystic pancreatic neoplasm such as mucinous cystadenoma or oligocystic serous cystadenoma could look similar. Imaging surveillance suggested.  This should optimally be performed with pre and post contrast MRI at  approximately 6 months. 3.  Slight increase in pelvic adenopathy, presumed to be reactive. 4.  Cannot exclude mild pancreatitis involving the head and neck. 5.  Hepatic steatosis. 6.  Fat containing ventral abdominal wall hernias. 7.  Similar left adrenal nodule.  Most likely an adenoma. Technically indeterminate. 8.  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   Original Report Authenticated By: Jeronimo Greaves, M.D.    Assessment / Plan: -Abdominal pain with nausea and vomiting post EUS with FNA of pancreatic cyst. CT scan shows possible mild pancreatitis. Lipase has returned to normal, but still with symptoms and no real improvement over the past week.  -Pancreatic cyst: Has re-accumulated already as seen on CT scan.  -Diarrhea x 2 months: Had terminal ileitis on CT scan in July but was suspected to be infectious and was treated with cipro and flagyl.  Terminal ileitis resolved on repeat CT scan. ? If diarrhea is secondary to pancreatic insufficiency.  Diarrhea has recurred as of yesterday.   *Surgical consult recommending that surgery be performed at a tertiary center.  Surgery PA is looking to see if [facility] or [facility] are willing to accept her in transfer. *Continue conservative management for now with IVF's, pain control, and anti-emetics.  *When she is taking PO then will restart pancreatic enzymes but at higher dose (she was on 6000 units three times a day with each meal, but says that they did not seem to be helping with the diarrhea; this may not have been an adequate dose for her).  Follow up fecal elastase results.  *Will check Cdiff with recurrence of significant diarrhea.    LOS: 7 days   ZEHR, JESSICA D.  04/21/2013, 9:00 AM  Pager number [phone]   [facility] Attending  I have also seen and assessed the patient and agree with the above note. Agree with [facility] that tertiary center care in her best interest.  Promethazine helps best for pc nausea.  Denies headache or anxiety.  Diarrhea may have been from CT contrast - if none in next 24 hrs will cancel C diff test then Add scoplamine patch for nausea.  Iva Boop, MD, [facility] [phone] (pager) 04/21/2013 10:26 AM

## 2013-04-21 NOTE — Progress Notes (Signed)
Patient ID: Mary [name], female   DOB: 03-20-1976, 36 y.o.   MRN: [id number]    Subjective: Still with nausea/vomiting and some pain, +BM yesterday, obviously frustrated with her situation and is wanting to feel better  Objective: Vital signs in last 24 hours: Temp:  [97.6 F (36.4 C)-99 F (37.2 C)] 97.9 F (36.6 C) (09/04 0533) Pulse Rate:  [74-82] 81 (09/04 0533) Resp:  [18-20] 18 (09/04 0533) BP: (100-127)/(60-83) 100/67 mmHg (09/04 0533) SpO2:  [97 %] 97 % (09/04 0533) Last BM Date: 04/20/13  Intake/Output from previous day: 09/03 0701 - 09/04 0700 In: 2930 [P.O.:600; I.V.:2330] Out: 1302 [Urine:1300; Stool:2] Intake/Output this shift:    PE: Abd: mildly distended, mildly tender, +BS, no peritonitis General: NAD Heart: RRR Lungs: CTA bil  Lab Results:   Recent Labs  04/21/13 0505  WBC 6.6  HGB 10.7*  HCT 33.2*  PLT 264   BMET  Recent Labs  04/19/13 0430 04/21/13 0505  NA 138  --   K 4.0  --   CL 104  --   CO2 23  --   GLUCOSE 67*  --   BUN 3*  --   CREATININE 0.60 0.54  CALCIUM 9.0  --    PT/INR No results found for this basename: LABPROT, INR,  in the last 72 hours CMP     Component Value Date/Time   NA 138 04/19/2013 0430   K 4.0 04/19/2013 0430   CL 104 04/19/2013 0430   CO2 23 04/19/2013 0430   GLUCOSE 67* 04/19/2013 0430   BUN 3* 04/19/2013 0430   CREATININE 0.54 04/21/2013 0505   CALCIUM 9.0 04/19/2013 0430   PROT 6.4 04/18/2013 0430   ALBUMIN 2.5* 04/18/2013 0430   AST 23 04/18/2013 0430   ALT 18 04/18/2013 0430   ALKPHOS 69 04/18/2013 0430   BILITOT 0.4 04/18/2013 0430   GFRNONAA >90 04/21/2013 0505   GFRAA >90 04/21/2013 0505   Lipase     Component Value Date/Time   LIPASE 11 04/18/2013 0430       Studies/Results: Ct Abdomen Pelvis W Contrast  04/19/2013   *RADIOLOGY REPORT*  Clinical Data: History of recurrent pancreatitis.  Stent placement in 2007.  CT ABDOMEN AND PELVIS WITH CONTRAST  Technique:  Multidetector CT imaging of the abdomen and  pelvis was performed following the standard protocol during bolus administration of intravenous contrast.  Contrast: 25mL OMNIPAQUE IOHEXOL 300 MG/ML  SOLN, OMNIPAQUE IOHEXOL 300 MG/ML  SOLN  Comparison: Acute abdomen series 04/14/2013.  CT 03/15/2013.  Findings: Lung bases:  Clear lung bases.  Normal heart size without pericardial or pleural effusion.  Small para esophageal nodes which are likely reactive.  Abdomen/pelvis:  Moderate hepatic steatosis.  Normal spleen, stomach.  Pancreatic body/tail junction macrolobulated cystic lesion measures 4.4 x 3.7 cm on image 24/series 2.  Similar to 4.4 x 3.9 cm at the same level on the prior exam.  Peripancreatic fat planes in the region of the neck and head are somewhat ill-defined.  This could be due to patient body habitus, but mild superimposed acute pancreatitis is difficult to exclude.  No peripancreatic fluid collection or evidence of pancreatic ductal dilatation.  Cholecystectomy without biliary ductal dilatation.  Normal right adrenal gland.  Similar left adrenal nodule of 1.6 cm. Suspect a low density interpolar left renal lesion, most likely a small cyst.  No retroperitoneal or retrocrural adenopathy.  Mildly prominent porta hepatis nodes which are likely reactive and related to steatosis.  Collaterals  in the gastroepiploic distribution suggest splenic vein insufficiency, likely in the region of the pancreatic tail.  Normal colon and terminal ileum.  Prior enterotomy.  Otherwise, normal appearance of small bowel loops.  Right external iliac node of 1.3 cm on image 75.  1.0 cm on the prior. More inferior right external iliac node there is  1.4 cm and is not significantly changed.  Normal urinary bladder and uterus, without adnexal mass or significant free pelvic fluid.  Fat containing ventral abdominal wall hernias.  Bones/Musculoskeletal:  No acute osseous abnormality.  IMPRESSION:  1.  Resolution of previously described terminal ileitis. 2.  Similar  macrolobulated cystic lesion in the pancreatic tail/body junction.  Given history of pancreatitis, most likely a pseudocyst.  Cystic pancreatic neoplasm such as mucinous cystadenoma or oligocystic serous cystadenoma could look similar. Imaging surveillance suggested.  This should optimally be performed with pre and post contrast MRI at approximately 6 months. 3.  Slight increase in pelvic adenopathy, presumed to be reactive. 4.  Cannot exclude mild pancreatitis involving the head and neck. 5.  Hepatic steatosis. 6.  Fat containing ventral abdominal wall hernias. 7.  Similar left adrenal nodule.  Most likely an adenoma. Technically indeterminate. 8.  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   Original Report Authenticated By: Jeronimo Greaves, M.D.    Anti-infectives: Anti-infectives   Start     Dose/Rate Route Frequency Ordered Stop   04/14/13 2200  ciprofloxacin (CIPRO) IVPB 400 mg     400 mg 200 mL/hr over 60 Minutes Intravenous Every 12 hours 04/14/13 2140 04/17/13 1121       Assessment/Plan Chronic/recurrent pancreatitis with pancreatic cyst: patient has a complex situation given the previous stent and pancreatic/SB anastomosis, she also continues to have persistent symptoms despite her pancreatitis seeming to improve from lab standpoint.  I have contacted Dr. Marilynn Rail at [facility], we will be FedEx'ing him a copy of her current hospitalization and a CD of the CT scan so that he will receive this by tomorrow.  He has requested a copy of her records from [facility] be obtained as soon as possible.  He does not feel she needs acute transfer but to continue medical management here.  I have given him my cell phone and Dr. Maris Berger number as well.  I contacted Dr. Waymon Amato and given Dr. Graylon Good number to him as Dr. Waymon Amato is the attending on this.  There is no indication for acute surgical intervention.    I have spent 30-45 mins coordinating care for the patient  today including contacting a tertiary center and arranging medical records.    LOS: 7 days    Angline Schweigert 04/21/2013

## 2013-04-21 NOTE — Progress Notes (Signed)
Right SL PICC exchanged to DL PICC for TNA - exchange done without difficulty via current site with PICC exchange done over radiology guidewire and new DL PICC advanced into position using ECG technology to verify tip PICC in SVC. Ready to use

## 2013-04-22 ENCOUNTER — Inpatient Hospital Stay (HOSPITAL_COMMUNITY): Payer: Self-pay

## 2013-04-22 DIAGNOSIS — R1115 Cyclical vomiting syndrome unrelated to migraine: Secondary | ICD-10-CM

## 2013-04-22 LAB — DIFFERENTIAL
Basophils Absolute: 0 10*3/uL (ref 0.0–0.1)
Basophils Relative: 0 % (ref 0–1)
Eosinophils Absolute: 0.7 10*3/uL (ref 0.0–0.7)
Eosinophils Relative: 11 % — ABNORMAL HIGH (ref 0–5)
Monocytes Absolute: 0.7 10*3/uL (ref 0.1–1.0)
Monocytes Relative: 10 % (ref 3–12)
Neutro Abs: 3.4 10*3/uL (ref 1.7–7.7)

## 2013-04-22 LAB — COMPREHENSIVE METABOLIC PANEL
ALT: 20 U/L (ref 0–35)
Alkaline Phosphatase: 68 U/L (ref 39–117)
BUN: <3 mg/dL — ABNORMAL LOW (ref 6–23)
CO2: 23 mEq/L (ref 19–32)
Calcium: 8.4 mg/dL (ref 8.4–10.5)
GFR calc Af Amer: >90 mL/min (ref >90)
GFR calc non Af Amer: >90 mL/min (ref >90)
Glucose, Bld: 115 mg/dL — ABNORMAL HIGH (ref 70–99)
Potassium: 3.5 mEq/L (ref 3.5–5.1)
Total Protein: 6.5 g/dL (ref 6.0–8.3)

## 2013-04-22 LAB — CBC
HCT: 32.3 % — ABNORMAL LOW (ref 36.0–46.0)
Hemoglobin: 10.4 g/dL — ABNORMAL LOW (ref 12.0–15.0)
MCH: 28.3 pg (ref 26.0–34.0)
MCHC: 32.2 g/dL (ref 30.0–36.0)
RDW: 14.1 % (ref 11.5–15.5)

## 2013-04-22 LAB — GLUCOSE, CAPILLARY: Glucose-Capillary: 101 mg/dL — ABNORMAL HIGH (ref 70–99)

## 2013-04-22 LAB — PREALBUMIN: Prealbumin: 9.7 mg/dL — ABNORMAL LOW (ref 17.0–34.0)

## 2013-04-22 LAB — TRIGLYCERIDES: Triglycerides: 178 mg/dL — ABNORMAL HIGH (ref <150)

## 2013-04-22 MED ORDER — LORAZEPAM 2 MG/ML IJ SOLN
1.0000 mg | Freq: Once | INTRAMUSCULAR | Status: AC
  Administered 2013-04-22: 2 mg via INTRAVENOUS
  Filled 2013-04-22: qty 1

## 2013-04-22 MED ORDER — FENTANYL 25 MCG/HR TD PT72
25.0000 ug | MEDICATED_PATCH | TRANSDERMAL | Status: DC
  Administered 2013-04-22 – 2013-04-25 (×2): 25 ug via TRANSDERMAL
  Filled 2013-04-22 (×2): qty 1

## 2013-04-22 MED ORDER — KCL IN DEXTROSE-NACL 20-5-0.9 MEQ/L-%-% IV SOLN
INTRAVENOUS | Status: AC
  Filled 2013-04-22 (×2): qty 1000

## 2013-04-22 MED ORDER — TRACE MINERALS CR-CU-F-FE-I-MN-MO-SE-ZN IV SOLN
INTRAVENOUS | Status: AC
  Administered 2013-04-22: 18:00:00 via INTRAVENOUS
  Filled 2013-04-22: qty 2000

## 2013-04-22 MED ORDER — GADOBENATE DIMEGLUMINE 529 MG/ML IV SOLN
20.0000 mL | Freq: Once | INTRAVENOUS | Status: AC | PRN
  Administered 2013-04-22: 20 mL via INTRAVENOUS

## 2013-04-22 MED ORDER — ENOXAPARIN SODIUM 60 MG/0.6ML ~~LOC~~ SOLN
60.0000 mg | SUBCUTANEOUS | Status: DC
  Filled 2013-04-22 (×3): qty 0.6

## 2013-04-22 MED ORDER — PROMETHAZINE HCL 25 MG PO TABS
25.0000 mg | ORAL_TABLET | Freq: Four times a day (QID) | ORAL | Status: DC
  Administered 2013-04-22 (×2): 25 mg via ORAL
  Filled 2013-04-22 (×16): qty 1

## 2013-04-22 MED ORDER — FAT EMULSION 20 % IV EMUL
250.0000 mL | INTRAVENOUS | Status: AC
  Administered 2013-04-22: 250 mL via INTRAVENOUS
  Filled 2013-04-22: qty 250

## 2013-04-22 NOTE — Progress Notes (Addendum)
PARENTERAL NUTRITION CONSULT NOTE - Follow Up  Pharmacy Consult for TNA Indication: Pancreatitis with intractable nausea and vomiting, bowel rest  Allergies  Allergen Reactions  . Penicillins Anaphylaxis  . Latex Itching  . Morphine And Related Nausea And Vomiting   Patient Measurements: Height: 5\' 4"  (162.6 cm) Weight: 245 lb 6 oz (111.3 kg) IBW/kg (Calculated) : 54.7  Vital Signs: Temp: 98.5 F (36.9 C) (09/05 0540) Temp src: Oral (09/05 0540) BP: 110/69 mmHg (09/05 0540) Pulse Rate: 64 (09/05 0540) Intake/Output from previous day: 09/04 0701 - 09/05 0700 In: 2808.5 [P.O.:660; I.V.:1673.3; TPN:475.2] Out: 1400 [Urine:1400] Intake/Output from this shift:    Labs:  Recent Labs  04/21/13 0505 04/22/13 0555  WBC 6.6 6.7  HGB 10.7* 10.4*  HCT 33.2* 32.3*  PLT 264 276     Recent Labs  04/21/13 0505 04/22/13 0555  NA  --  138  K  --  3.5  CL  --  107  CO2  --  23  GLUCOSE  --  115*  BUN  --  <3*  CREATININE 0.54 0.54  CALCIUM  --  8.4  MG  --  1.7  PHOS  --  3.6  PROT  --  6.5  ALBUMIN  --  2.7*  AST  --  22  ALT  --  20  ALKPHOS  --  68  BILITOT  --  0.2*  TRIG  --  178*   Estimated Creatinine Clearance: 119.8 ml/min (by C-G formula based on Cr of 0.54).    Recent Labs  04/22/13 0011 04/22/13 0412 04/22/13 0734  GLUCAP 104* 127* 115*   Medical History: Past Medical History  Diagnosis Date  . Hernia 03-24-13    ventral hernia remains   . GERD (gastroesophageal reflux disease)   . Pancreatitis 03-24-13    2002, 2'2013, 03-14-13  . Pancreatic cyst 2002 onset  . Anxiety   . Depression     "recent breakup with partner of 15 yrs"   Medications:  Scheduled:  . enoxaparin (LOVENOX) injection  40 mg Subcutaneous Q24H  . fentaNYL  25 mcg Transdermal Q72H  . insulin aspart  0-9 Units Subcutaneous Q4H  . lipase/protease/amylase  2 capsule Oral TID AC  . pantoprazole (PROTONIX) IV  40 mg Intravenous Daily  . promethazine  25 mg Oral Q6H  .  scopolamine  1 patch Transdermal Q72H   Infusions:  . Marland KitchenTPN (CLINIMIX-E) Adult 40 mL/hr at 04/21/13 2008  . dextrose 5 % and 0.9 % NaCl with KCl 20 mEq/L 40 mL/hr at 04/22/13 0506  . fat emulsion 250 mL (04/21/13 2007)   IVF:  dextrose 5 % and 0.9 % NaCl with KCl 20 mEq/L infusion @ 16ml/hr  Insulin Requirements in the past 24 hours:  SSI q4h, No insulin required. No hx DM  Nutritional Goals:  RD recs Kcal: 1650-1850  Protein: 80-100g  Fluid: 1.6-1.8L/day  Clinimix 5/15% at a goal rate of 75 ml/hr + Lipid 20% @ 6ml/hr will provide 90g protein and 1758 kcal/day (1.8L/d)  Current Nutrition:  NPO Clinimix E 5/20% @ 61ml/h + Fat Emulsion 20% @ 78ml/h  Assessment: 36 yoF admit 8/28 with hx Pancreatitis, pancreatic cyst with previous stent placement/removal and anastomosis with small bowel (at [facility] for cyst drainage). Unable to tolerate po, TNA started 9/4. Possible transfer to [facility], no surgical intervention necessary. Plan is for home TNA. Single lumen PICC changed to DL PICC 9/4.   Tolerating TNA without reported issues  Elytes wnl  LFTs wnl  TG and Prealbumin pending  CBG <150  Scr wnl, CrCl > 100 ml/min  Nutritional Goals:  1650-1850 kCal, 80-100 grams of protein per day per RD  Plan:   Change to Clinimix E 5/15% and increase to 60 ml/hr, continue lipids @ 75ml/hr  AM BMET, Mg and Phos; continue to monitor CBG  MVI and Trace Elements daily  Decrease MIVF to 49ml/hr  Monitor for s/sx of refeeding syndrome  Plan to transition to cyclic TNA after goal rate achieved (note that this conversion can be done outpt)  Full TNA labs Mon,Thurs per protocol  Gwen Her PharmD  [phone] 04/22/2013 9:23 AM

## 2013-04-22 NOTE — Progress Notes (Signed)
Patient seen and examined.  She had a Roux en Y cystjejunostomy at [facility] in October of 2008.  I have spoken with Dr. Marilynn Rail and records have been sent and faxed to him.  Will need to control her symptoms medically and then have her seen as an outpatient.

## 2013-04-22 NOTE — Progress Notes (Signed)
Patient ID: Mary [name], female   DOB: 08/20/1975, 37 y.o.   MRN: [id number]    Subjective: Still with nausea/vomiting, feels worse today  Objective: Vital signs in last 24 hours: Temp:  [97.6 F (36.4 C)-98.6 F (37 C)] 98.5 F (36.9 C) (09/05 0540) Pulse Rate:  [64-80] 64 (09/05 0540) Resp:  [16-18] 18 (09/05 0540) BP: (101-118)/(57-77) 110/69 mmHg (09/05 0540) SpO2:  [96 %-99 %] 97 % (09/05 0540) Last BM Date: 04/20/13  Intake/Output from previous day: 09/04 0701 - 09/05 0700 In: 2808.5 [P.O.:660; I.V.:1673.3; TPN:475.2] Out: 1400 [Urine:1400] Intake/Output this shift:    PE: Abd: mildly distended, mildly tender, +BS, no peritonitis General: NAD Heart: RRR Lungs: CTA bil  Lab Results:   Recent Labs  04/21/13 0505 04/22/13 0555  WBC 6.6 6.7  HGB 10.7* 10.4*  HCT 33.2* 32.3*  PLT 264 276   BMET  Recent Labs  04/21/13 0505 04/22/13 0555  NA  --  138  K  --  3.5  CL  --  107  CO2  --  23  GLUCOSE  --  115*  BUN  --  <3*  CREATININE 0.54 0.54  CALCIUM  --  8.4   PT/INR No results found for this basename: LABPROT, INR,  in the last 72 hours CMP     Component Value Date/Time   NA 138 04/22/2013 0555   K 3.5 04/22/2013 0555   CL 107 04/22/2013 0555   CO2 23 04/22/2013 0555   GLUCOSE 115* 04/22/2013 0555   BUN <3* 04/22/2013 0555   CREATININE 0.54 04/22/2013 0555   CALCIUM 8.4 04/22/2013 0555   PROT 6.5 04/22/2013 0555   ALBUMIN 2.7* 04/22/2013 0555   AST 22 04/22/2013 0555   ALT 20 04/22/2013 0555   ALKPHOS 68 04/22/2013 0555   BILITOT 0.2* 04/22/2013 0555   GFRNONAA >90 04/22/2013 0555   GFRAA >90 04/22/2013 0555   Lipase     Component Value Date/Time   LIPASE 11 04/18/2013 0430       Studies/Results: No results found.  Anti-infectives: Anti-infectives   Start     Dose/Rate Route Frequency Ordered Stop   04/14/13 2200  ciprofloxacin (CIPRO) IVPB 400 mg     400 mg 200 mL/hr over 60 Minutes Intravenous Every 12 hours 04/14/13 2140 04/17/13 1121        Assessment/Plan Chronic/recurrent pancreatitis with pancreatic cyst: patient has a complex situation given the previous stent and pancreatic/SB anastomosis, she also continues to have persistent symptoms despite her pancreatitis seeming to improve from lab standpoint.  I have contacted Dr. Marilynn Rail at [facility], we have FedEx'ed him a copy of her current hospitalization and a CD of the CT scan.  He has requested a copy of her records from [facility] be obtained and we will fax those today.  He does not feel she needs acute transfer but to continue medical management here.  There is no indication for acute surgical intervention.       LOS: 8 days    Urias Sheek 04/22/2013

## 2013-04-22 NOTE — Progress Notes (Signed)
[facility] Progress Note  Subjective:  TNA started.  No more diarrhea.  Still with nausea and vomiting.  Abdominal pain with minimal to no improvement.  Scopolamine patches will not stay on.  Objective:  Vital signs in last 24 hours: Temp:  [97.6 F (36.4 C)-98.5 F (36.9 C)] 98.5 F (36.9 C) (09/05 0540) Pulse Rate:  [64-80] 64 (09/05 0540) Resp:  [16-18] 18 (09/05 0540) BP: (110-118)/(57-77) 110/69 mmHg (09/05 0540) SpO2:  [96 %-99 %] 97 % (09/05 0540) Last BM Date: 04/20/13 General:   Alert, Well-developed, in NAD; diffuse alopecia. Heart:  Regular rate and rhythm; no murmurs Pulm:  CTAB.  No W/R/R. Abdomen:  Soft, obese.  Non-distended.  BS present but quiet.  Upper abdominal TTP without R/R/G.   Extremities:  Without edema. Neurologic:  Alert and  oriented x4;  grossly normal neurologically. Psych:  Alert and cooperative. Normal mood and affect.  Intake/Output from previous day: 09/04 0701 - 09/05 0700 In: 2808.5 [P.O.:660; I.V.:1673.3; TPN:475.2] Out: 1400 [Urine:1400]  Lab Results:  Recent Labs  04/21/13 0505 04/22/13 0555  WBC 6.6 6.7  HGB 10.7* 10.4*  HCT 33.2* 32.3*  PLT 264 276   BMET  Recent Labs  04/21/13 0505 04/22/13 0555  NA  --  138  K  --  3.5  CL  --  107  CO2  --  23  GLUCOSE  --  115*  BUN  --  <3*  CREATININE 0.54 0.54  CALCIUM  --  8.4   LFT  Recent Labs  04/22/13 0555  PROT 6.5  ALBUMIN 2.7*  AST 22  ALT 20  ALKPHOS 68  BILITOT 0.2*   Assessment / Plan: -Abdominal pain with nausea and vomiting post EUS with FNA of pancreatic cyst. CT scan shows possible mild pancreatitis. Lipase has returned to normal, but still with symptoms and no real improvement over the past week.  -Pancreatic cyst: Has re-accumulated already as seen on CT scan.  -Diarrhea x 2 months: Had terminal ileitis on CT scan in July but was suspected to be infectious and was treated with cipro and flagyl. Terminal ileitis resolved on repeat CT  scan. ? If diarrhea is secondary to pancreatic insufficiency.  No further diarrhea as inpatient except for one day (suspect that was due to CT contrast and we will cancel the Cdiff study).  *Surgical consult recommending that surgery be performed at a tertiary center.  Surgery team has been in contact with Dr. Marilynn Rail at [facility] regarding the case.  Recommended to continue medical management here and outpatient follow-up with him.  Records are being sent to his facility.  *Continue conservative management for now with IVF's, pain control, and anti-emetics.  Phenergan has been changed to ATC/scheduled since scopolamine patch will not stay on. Getting Zofran prn as well.  *Pancreatic enzymes have been restarted.  Follow up results of fecal elastase. *Will order CT scan of the head to rule out intracranial cause of nausea and vomiting. *Continue ice chips only along with TNA.    LOS: 8 days   ZEHR, JESSICA D.  04/22/2013, 9:37 AM  Pager number [phone]   [facility] Attending  I have also seen and assessed the patient and agree with the above note. Patient in bed w/ stuffed animal. abd non-tender. 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

## 2013-04-22 NOTE — Progress Notes (Signed)
[facility]  Patient Status:  Mary [name] is a new pt for [facility] this admission.   [facility] is providing the following services:   [facility] will be providing a HH RN and [facility] for home TPA.  [facility] hospital infusion coordinator will provide in hospital pre discharge teaching with pt to support transition home with TNA when deemed appropriate by MD team.  If patient discharges after hours, please call [phone].   Sedalia Muta 04/22/2013, 3:33 PM

## 2013-04-22 NOTE — Progress Notes (Signed)
[facility] PROGRESS NOTE  Mary [name] ZOX:[id number] DOB: 1976-01-10 DOA: 04/14/2013 PCP: Dorrene German, MD  HPI/Brief narrative 37 year old female with history of recurrent pancreatitis and pancreatic cyst of unknown etiology, post cholecystectomy, does not drink alcohol, underwent extensive evaluation by GI and surgery at [facility] view and underwent pancreatic stent endoscopically which was removed 36 hours later because it was infected, then underwent open surgery for? Small bowel to pancreas anastomosis to drain the cyst. Despite this she continued to have episodes of pancreatitis. Recently hospitalized July-August for pancreatitis and terminal ileitis with diarrhea. Discharge home on Cipro/Flagyl. Continued to have diarrhea. Underwent endoscopic ultrasound and biopsy of pancreatic cyst by Dr. Christella Hartigan on 8/28-16 mL of thick clear fluid removed-not sent for cultures. Admitted post procedure for worsening abdominal pain, elevated lipase. [facility] has consulted and requested surgical consult on 9/3.  Assessment/Plan:  Acute pancreatitis post EUS and FNA of pancreatic cyst, complicating chronic pancreatitis/pancreatic cyst. - Patient continues to have nausea and abdominal pain. Continue symptomatic treatment-IV fluids, pain control and antiemetics. - GI consultation appreciated. Start by mouth pancreatic enzymes when able to tolerate. - Surgeons and GI consultation and followup appreciated. They recommend nonemergent outpatient evaluation at tertiary care center/Dr. Rise Mu at [facility] - No malignant cells on cytology. - Patient unable to tolerate oral intake without significant pain, nausea or vomiting. TPN started on 9/4. - Patient has persistent nausea and abdominal pain. Starting patient on scheduled oral Phenergan since scopolamine patch will not stay on and fentanyl patch. Monitor  - Shunt n.p.o. except ice chips.  - GI ordered CT head to rule out other causes for nausea  and vomiting. CT shows extensive leukoencephalopathy not in keeping with her young age. Please see below.  Diarrhea x2 months/terminal ileitis - Had terminal ileitis on CT in July which was suspected to be infectious and completed treatment with Cipro and Flagyl. Still having intermittent diarrhea. - Terminal ileitis resolved on repeat CT this admission.  Anemia - No active bleeding. Stable.  Severe protein calorie malnutrition - Management per nutritionist.  - Unable to tolerate by mouth. Starting TPN.  Abnormal CT Head/Extensive leukoencephalopathy - Incidental finding on CT Head. - Discussed with Radiologist and Neurology: will obtain MRI Brain with and without contrast to further evaluate.  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 Outpatient followup as deemed necessary.  DVT prophylaxis: Lovenox Lines/catheters: PICC Nutrition: N.p.o. [name] Code Status: Full Family Communication: None Disposition Plan: Home possibly in next 1-2 days on TPN and OP consultation at [facility].   Consultants:  [facility]  General surgery  Neurology: will see after MRI Brain  Procedures:  PICC  Antibiotics:  None   Subjective: Persistent nausea and abdominal pain. Intermittent vomiting. No BM since yesterday.  Objective: Filed Vitals:   04/21/13 1800 04/21/13 2215 04/22/13 0540 04/22/13 1439  BP: 118/75 116/77 110/69 112/69  Pulse: 80 69 64 69  Temp: 97.6 F (36.4 C) 97.9 F (36.6 C) 98.5 F (36.9 C) 97.9 F (36.6 C)  TempSrc: Oral Oral Oral Oral  Resp: 18 18 18    Height:      Weight:      SpO2: 99% 96% 97% 97%    Intake/Output Summary (Last 24 hours) at 04/22/13 1556 Last data filed at 04/22/13 1400  Gross per 24 hour  Intake 2133.5 ml  Output   1500 ml  Net  633.5 ml   [employer]   04/14/13  2100  Weight: 111.3 kg (245 lb 6 oz)     Exam:  General exam: Moderately built and obese  female patient sitting up in bed comfortably. Respiratory system: Clear. No increased work of breathing. Cardiovascular system: S1 & S2 heard, RRR. No JVD, murmurs, gallops, clicks or pedal edema. Gastrointestinal system: Abdomen is nondistended, soft and mild lower abdominal tenderness without rigidity, guarding or rebound. Normal bowel sounds heard. Central nervous system: Alert and oriented. No focal neurological deficits. Extremities: Symmetric 5 x 5 power.   Data Reviewed: Basic Metabolic Panel:  Recent Labs Lab 04/16/13 0415 04/17/13 0504 04/18/13 0430 04/19/13 0430 04/21/13 0505 04/22/13 0555  NA 132* 133* 136 138  --  138  K 3.9 4.1 4.0 4.0  --  3.5  CL 100 103 105 104  --  107  CO2 23 22 23 23   --  23  GLUCOSE 100* 101* 97 67*  --  115*  BUN 4* 3* 3* 3*  --  <3*  CREATININE 0.56 0.57 0.55 0.60 0.54 0.54  CALCIUM 8.8 8.5 8.3* 9.0  --  8.4  MG  --   --   --   --   --  1.7  PHOS  --   --   --   --   --  3.6   Liver Function Tests:  Recent Labs Lab 04/16/13 0415 04/18/13 0430 04/22/13 0555  AST 19 23 22   ALT 22 18 20   ALKPHOS 89 69 68  BILITOT 0.4 0.4 0.2*  PROT 7.4 6.4 6.5  ALBUMIN 3.1* 2.5* 2.7*    Recent Labs Lab 04/16/13 0415 04/17/13 0504 04/18/13 0430  LIPASE 934* 28 11   No results found for this basename: AMMONIA,  in the last 168 hours CBC:  Recent Labs Lab 04/16/13 0415 04/17/13 0504 04/18/13 0430 04/21/13 0505 04/22/13 0555  WBC 11.0* 9.5 7.0 6.6 6.7  NEUTROABS  --   --   --   --  3.4  HGB 12.2 11.2* 10.2* 10.7* 10.4*  HCT 38.0 33.8* 31.9* 33.2* 32.3*  MCV 88.6 86.9 88.1 87.8 87.8  PLT 225 174 183 264 276   Cardiac Enzymes: No results found for this basename: CKTOTAL, CKMB, CKMBINDEX, TROPONINI,  in the last 168 hours BNP (last 3 results) No results found for this basename: PROBNP,  in the last 8760 hours CBG:  Recent Labs Lab 04/21/13 2003 04/22/13 0011 04/22/13 0412 04/22/13 0734 04/22/13 1135  GLUCAP 78 104* 127*  115* 108*    Recent Results (from the past 240 hour(s))  SURGICAL PCR SCREEN     Status: None   Collection Time    04/15/13 12:56 AM      Result Value Range Status   MRSA, PCR NEGATIVE  NEGATIVE Final   Staphylococcus aureus NEGATIVE  NEGATIVE Final   Comment:            The Xpert SA Assay (FDA     approved for NASAL specimens     in patients over 70 years of age),     is one component of     a comprehensive surveillance     program.  Test performance has     been validated by [employer] for patients greater     than or equal to 19 year old.     It is not intended     to diagnose infection nor to     guide or monitor treatment.  Additional labs: 1. Diagnosis FINE NEEDLE ASPIRATION, ENDOSCOPIC, PANCREAS CYST FLUID: NO MALIGNANT CELLS IDENTIFIED. FINDINGS CONSISTENT WITH THE CONTENTS OF A CYST.     Studies: Ct Head Wo Contrast  04/22/2013   *RADIOLOGY REPORT*  Clinical Data: Nausea and vomiting.  CT HEAD WITHOUT CONTRAST  Technique:  Contiguous axial images were obtained from the base of the skull through the vertex without contrast.  Comparison: None.  Findings: Extensive hypodensity throughout the cerebral white matter bilaterally.  This is primarily subcortical in location and is symmetric and bilateral.  Ventricle size is normal.  Cerebral volume is normal.  Brainstem and cerebellum are normal by CT.  Negative for hemorrhage.  IMPRESSION: Extensive leukoencephalopathy.  No prior studies available to determine if this is acute or chronic.  Differential diagnosis includes multiple sclerosis and chronic ischemia.  Hypertensive and metabolic encephalopathy are possible.  MRI brain without with contrast is suggested for further evaluation.  I discussed the findings by telephone with Dr. Waymon Amato   Original Report Authenticated By: Janeece Riggers, M.D.        Scheduled Meds: . enoxaparin (LOVENOX) injection  60 mg Subcutaneous Q24H  . fentaNYL  25 mcg Transdermal Q72H   . insulin aspart  0-9 Units Subcutaneous Q4H  . lipase/protease/amylase  2 capsule Oral TID AC  . pantoprazole (PROTONIX) IV  40 mg Intravenous Daily  . promethazine  25 mg Oral Q6H  . scopolamine  1 patch Transdermal Q72H   Continuous Infusions: . Marland KitchenTPN (CLINIMIX-E) Adult 40 mL/hr at 04/21/13 2008  . dextrose 5 % and 0.9 % NaCl with KCl 20 mEq/L 40 mL/hr at 04/22/13 0506  . dextrose 5 % and 0.9 % NaCl with KCl 20 mEq/L    . fat emulsion 250 mL (04/21/13 2007)  . Marland KitchenTPN (CLINIMIX-E) Adult     And  . fat emulsion      Principal Problem:   Pancreatitis, acute Active Problems:   Obesity, morbid   Pancreatic cyst   history of recurrent ideopathic pancreatitis   Terminal ileitis   Protein-calorie malnutrition, severe   Anemia   Diarrhea   Persistent vomiting    Time spent: 35 minutes    [facility]  [facility] Pager [phone].   If 8PM-8AM, please contact night-coverage at [email or web address], password [facility] 04/22/2013, 3:56 PM  LOS: 8 days

## 2013-04-22 NOTE — Progress Notes (Signed)
ANTICOAGULATION CONSULT NOTE - Initial Consult  Pharmacy Consult for Lovenox Indication: VTE prophylaxis  Allergies  Allergen Reactions  . Penicillins Anaphylaxis  . Latex Itching  . Morphine And Related Nausea And Vomiting    Patient Measurements: Height: 5\' 4"  (162.6 cm) Weight: 245 lb 6 oz (111.3 kg) IBW/kg (Calculated) : 54.7  Vital Signs: Temp: 98.5 F (36.9 C) (09/05 0540) Temp src: Oral (09/05 0540) BP: 110/69 mmHg (09/05 0540) Pulse Rate: 64 (09/05 0540)  Labs:  Recent Labs  04/21/13 0505 04/22/13 0555  HGB 10.7* 10.4*  HCT 33.2* 32.3*  PLT 264 276  CREATININE 0.54 0.54    Estimated Creatinine Clearance: 119.8 ml/min (by C-G formula based on Cr of 0.54).   Medical History: Past Medical History  Diagnosis Date  . Hernia 03-24-13    ventral hernia remains   . GERD (gastroesophageal reflux disease)   . Pancreatitis 03-24-13    2002, 2'2013, 03-14-13  . Pancreatic cyst 2002 onset  . Anxiety   . Depression     "recent breakup with partner of 15 yrs"    Medications:  Scheduled:  . enoxaparin (LOVENOX) injection  60 mg Subcutaneous Q24H  . fentaNYL  25 mcg Transdermal Q72H  . insulin aspart  0-9 Units Subcutaneous Q4H  . lipase/protease/amylase  2 capsule Oral TID AC  . pantoprazole (PROTONIX) IV  40 mg Intravenous Daily  . promethazine  25 mg Oral Q6H  . scopolamine  1 patch Transdermal Q72H    Assessment: 36 YOF admitted 8/28 w/ chronic/recurrent pancreatitis with pancreatic cyst. Lovenox 40mg  sq q24h ordered for VTE ppx, pharmacy may adjust. No surgical intervention planned  Wt 111.3kg, Ht 5'4", BMI 42  Scr wnl, CrCl > 134ml/min  No bleeding reported  Goal of Therapy:  Appropriate dose of Lovenox for VTE ppx Monitor platelets by anticoagulation protocol: Yes   Plan:  Increase Lovenox to 60mg  sq q24h for VTE ppx (0.5mg /kg q24h) Follow labs, s/sx of bleeding Adjust dose as necessary  Gwen Her PharmD  [phone] 04/22/2013 10:02  AM

## 2013-04-23 ENCOUNTER — Encounter (HOSPITAL_COMMUNITY): Payer: Self-pay | Admitting: Internal Medicine

## 2013-04-23 DIAGNOSIS — K861 Other chronic pancreatitis: Secondary | ICD-10-CM

## 2013-04-23 DIAGNOSIS — E2740 Unspecified adrenocortical insufficiency: Secondary | ICD-10-CM

## 2013-04-23 DIAGNOSIS — R9389 Abnormal findings on diagnostic imaging of other specified body structures: Secondary | ICD-10-CM

## 2013-04-23 DIAGNOSIS — E2749 Other adrenocortical insufficiency: Secondary | ICD-10-CM

## 2013-04-23 HISTORY — DX: Unspecified adrenocortical insufficiency: E27.40

## 2013-04-23 LAB — GLUCOSE, CAPILLARY
Glucose-Capillary: 97 mg/dL (ref 70–99)
Glucose-Capillary: 84 mg/dL (ref 70–99)

## 2013-04-23 LAB — BASIC METABOLIC PANEL
BUN: 3 mg/dL — ABNORMAL LOW (ref 6–23)
GFR calc non Af Amer: >90 mL/min (ref >90)
Glucose, Bld: 91 mg/dL (ref 70–99)
Potassium: 3.5 mEq/L (ref 3.5–5.1)

## 2013-04-23 LAB — IRON AND TIBC
Iron: 36 ug/dL — ABNORMAL LOW (ref 42–135)
Saturation Ratios: 16 % — ABNORMAL LOW (ref 20–55)
UIBC: 196 ug/dL (ref 125–400)

## 2013-04-23 LAB — CORTISOL-AM, BLOOD: Cortisol - AM: 1.8 ug/dL — ABNORMAL LOW (ref 4.3–22.4)

## 2013-04-23 LAB — TSH: TSH: 2.188 u[IU]/mL (ref 0.350–4.500)

## 2013-04-23 LAB — FOLATE: Folate: 9.5 ng/mL

## 2013-04-23 MED ORDER — TRACE MINERALS CR-CU-F-FE-I-MN-MO-SE-ZN IV SOLN
INTRAVENOUS | Status: DC
  Administered 2013-04-23: 18:00:00 via INTRAVENOUS
  Filled 2013-04-23: qty 2000

## 2013-04-23 MED ORDER — PROMETHAZINE HCL 25 MG/ML IJ SOLN
25.0000 mg | Freq: Once | INTRAMUSCULAR | Status: AC
  Administered 2013-04-23: 25 mg via INTRAVENOUS
  Filled 2013-04-23: qty 1

## 2013-04-23 MED ORDER — KCL IN DEXTROSE-NACL 20-5-0.9 MEQ/L-%-% IV SOLN
INTRAVENOUS | Status: DC
  Administered 2013-04-24 – 2013-04-27 (×3): via INTRAVENOUS
  Filled 2013-04-23 (×3): qty 1000

## 2013-04-23 MED ORDER — COSYNTROPIN 0.25 MG IJ SOLR
0.2500 mg | Freq: Once | INTRAMUSCULAR | Status: AC
  Administered 2013-04-24: 0.25 mg via INTRAVENOUS
  Filled 2013-04-23: qty 0.25

## 2013-04-23 MED ORDER — FAT EMULSION 20 % IV EMUL
240.0000 mL | INTRAVENOUS | Status: DC
  Administered 2013-04-23: 240 mL via INTRAVENOUS
  Filled 2013-04-23: qty 250

## 2013-04-23 MED ORDER — DIPHENHYDRAMINE HCL 50 MG/ML IJ SOLN: 25.0000 mg | Freq: Once | INTRAMUSCULAR | Status: DC

## 2013-04-23 NOTE — Progress Notes (Signed)
[facility] Progress Note  Patient Name: Mary [name] Date of Encounter: 04/23/2013, 8:01 AM    Subjective  Says she is about the same. She vomited promethazine tablet last PM. Abd pain 8/10   Objective    Physical Exam: Filed Vitals:   04/23/13 0449  BP: 96/60  Pulse: 68  Temp: 97.2 F (36.2 C)  Resp: 14   General: NAD Abdomen: Soft, mildly tender epigastrium, LUQ Psych: Flat    Intake/Output Summary (Last 24 hours) at 04/23/13 0801 Last data filed at 04/23/13 0300  Gross per 24 hour  Intake 513.01 ml  Output   1100 ml  Net -586.99 ml    Labs:  Recent Labs  04/22/13 0555 04/23/13 0415  NA 138 138  K 3.5 3.5  CL 107 106  CO2 23 24  GLUCOSE 115* 91  BUN <3* 3*  CREATININE 0.54 0.59  CALCIUM 8.4 8.5  MG 1.7 1.8  PHOS 3.6 4.1    Recent Labs  04/22/13 0555  AST 22  ALT 20  ALKPHOS 68  BILITOT 0.2*  PROT 6.5  ALBUMIN 2.7*     Recent Labs  04/21/13 0505 04/22/13 0555  WBC 6.6 6.7  NEUTROABS  --  3.4  HGB 10.7* 10.4*  HCT 33.2* 32.3*  MCV 87.8 87.8  PLT 264 276    Radiology/Studies:  Ct Head Wo Contrast  04/22/2013   *RADIOLOGY REPORT*  Clinical Data: Nausea and vomiting.  CT HEAD WITHOUT CONTRAST  Technique:  Contiguous axial images were obtained from the base of the skull through the vertex without contrast.  Comparison: None.  Findings: Extensive hypodensity throughout the cerebral white matter bilaterally.  This is primarily subcortical in location and is symmetric and bilateral.  Ventricle size is normal.  Cerebral volume is normal.  Brainstem and cerebellum are normal by CT.  Negative for hemorrhage.  IMPRESSION: Extensive leukoencephalopathy.  No prior studies available to determine if this is acute or chronic.  Differential diagnosis includes multiple sclerosis and chronic ischemia.  Hypertensive and metabolic encephalopathy are possible.  MRI brain without with contrast is suggested for further evaluation.  I discussed the  findings by telephone with Dr. Waymon Amato   Original Report Authenticated By: Janeece Riggers, M.D.   Mr Laqueta Jean Wo Contrast  04/22/2013   *RADIOLOGY REPORT*  Clinical Data: Abnormal head CT.  Nausea and vomiting.  History of pancreatitis.  Terminal ileitis.  Protein calorie malnutrition.  MRI HEAD WITHOUT AND WITH CONTRAST  Technique:  Multiplanar, multiecho pulse sequences of the brain and surrounding structures were obtained according to standard protocol without and with intravenous contrast  Contrast: 20mL MULTIHANCE GADOBENATE DIMEGLUMINE 529 MG/ML IV SOLN  Comparison: 04/22/2013 head CT.  No comparison brain MR.  Findings: Sequences are motion degraded.  No acute infarct.  No intracranial hemorrhage.  Diffuse white matter type changes throughout the supratentorial region as well as involving the pons.  Etiology indeterminate. Considerations include white matter type changes secondary to; demyelinating process (multiple sclerosis), small vessel disease, inflammatory process, vasculitis, metabolic abnormality, result of drug therapy or toxic exposure or malnutrition.  Sub centimeter cystic structure posterior right opercular region with surrounding T2 altered signal intensity without enhancement. This may reflect the same process causing white matter changes. Overall, no abnormal intracranial enhancing lesion.  No hydrocephalus or significant atrophy.  Major intracranial vascular structures are patent.  Cervical medullary junction, pituitary region, pineal region and orbital structures unremarkable.  IMPRESSION: Diffuse nonspecific white matter type changes as detailed above.  Original Report Authenticated By: Lacy Duverney, M.D.        Assessment and Plan  1) Persistent nausea and vomiting 2) Abdominal pain 3) Recent pancreatitis after EUS aspiration of pancreatic cyst 4) Diffuse brain white matter changes - unclear etiology and unclear relation to nausea and vomiting, ? If related 5) Cortisol level is  1.8 - very low - adrenal insufficiency - ? If this could be related to her white matter changes (not sure) and sxs (yes)   Will need treatment of low cortisol - adrenal insufficiency evaluation. Will discuss with Dr. Franchot Gallo, MD, [facility] [phone] (pager) 04/23/2013 8:10 AM

## 2013-04-23 NOTE — Progress Notes (Signed)
[facility] PROGRESS NOTE  Mary [name] JYN:[id number] DOB: 1976-06-07 DOA: 04/14/2013 PCP: Dorrene German, MD  HPI/Brief narrative 37 year old female with history of recurrent pancreatitis and pancreatic cyst of unknown etiology, post cholecystectomy, does not drink alcohol, underwent extensive evaluation by GI and surgery at [facility] and underwent pancreatic stent endoscopically which was removed 36 hours later because it was infected, then underwent open surgery for? Small bowel to pancreas anastomosis to drain the cyst. Despite this she continued to have episodes of pancreatitis. Recently hospitalized July-August for pancreatitis and terminal ileitis with diarrhea. Discharge home on Cipro/Flagyl. Continued to have diarrhea. Underwent endoscopic ultrasound and biopsy of pancreatic cyst by Dr. Christella Hartigan on 8/28-16 mL of thick clear fluid removed-not sent for cultures. Admitted post procedure for worsening abdominal pain, elevated lipase. [facility] has consulted and requested surgical consult on 9/3.  Assessment/Plan:  Acute pancreatitis post EUS and FNA of pancreatic cyst, complicating chronic pancreatitis/pancreatic cyst/intractable nausea. - Patient continues to have nausea and abdominal pain. Continue symptomatic treatment-IV fluids, pain control and antiemetics. - GI consultation appreciated. Start by mouth pancreatic enzymes when able to tolerate. - Surgeons and GI consultation and followup appreciated. They recommend nonemergent outpatient evaluation at tertiary care center/Dr. Rise Mu at [facility] - No malignant cells on cytology. - Patient unable to tolerate oral intake without significant pain, nausea or vomiting. TPN started on 9/4. - Patient has persistent nausea and abdominal pain. Starting patient on scheduled oral Phenergan since scopolamine patch will not stay on and fentanyl patch. Monitor  - Shunt n.p.o. except ice chips.  - GI ordered CT head to rule out  other causes for nausea and vomiting. CT shows extensive leukoencephalopathy not in keeping with her young age. Please see below. - Nausea and abdominal pain are slightly better than yesterday. Patient states that she has 8/10 pain-however she does not appear in any pain during visits to her room in the last couple of days. - Urine pregnancy test negative.  Adrenal insufficiency - Check cosyntropin test - Check TSH, free T4 - Hemodynamically stable. Will not start steroids until about results back.  Diarrhea x2 months/terminal ileitis - Had terminal ileitis on CT in July which was suspected to be infectious and completed treatment with Cipro and Flagyl. Still having intermittent diarrhea. - Terminal ileitis resolved on repeat CT this admission. - Had 2 small watery BMs yesterday.  Anemia - No active bleeding. Stable. - Check anemia panel due to possibility of nutritional deficiencies.  Severe protein calorie malnutrition - Management per nutritionist.  - Unable to tolerate by mouth. Starting TPN.  Abnormal CT & MRI Head/Extensive leukoencephalopathy - Incidental finding. - Neurology consulted and will see but indicates that this is a nonspecific finding which may be related to nutritional deficiencies and can be followed up with repeat scan and outpatient neurology.  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 Outpatient followup as deemed necessary.  DVT prophylaxis: Lovenox Lines/catheters: PICC Nutrition: N.p.o. [name] Code Status: Full Family Communication: None Disposition Plan: Home possibly in next 1-2 days on TPN and OP consultation at [facility]   Consultants:  [facility]  General surgery  Neurology  Procedures:  PICC  Antibiotics:  None   Subjective: Nausea and abdominal pain slightly better. 2 episodes of vomiting yesterday morning and 2 small watery BMs last night.  Objective: Filed  Vitals:   04/22/13 0540 04/22/13 1439 04/22/13 2141 04/23/13 0449  BP: 110/69 112/69 113/77  96/60  Pulse: 64 69 72 68  Temp: 98.5 F (36.9 C) 97.9 F (36.6 C) 97.6 F (36.4 C) 97.2 F (36.2 C)  TempSrc: Oral Oral Oral Oral  Resp: 18  18 14   Height:      Weight:      SpO2: 97% 97% 99% 92%    Intake/Output Summary (Last 24 hours) at 04/23/13 1129 Last data filed at 04/23/13 1033  Gross per 24 hour  Intake    120 ml  Output   1400 ml  Net  -1280 ml   Filed Weights   04/14/13 2100  Weight: 111.3 kg (245 lb 6 oz)     Exam:  General exam: Moderately built and obese female patient sitting up in bed comfortably. Respiratory system: Clear. No increased work of breathing. Cardiovascular system: S1 & S2 heard, RRR. No JVD, murmurs, gallops, clicks or pedal edema. Gastrointestinal system: Abdomen is nondistended, soft and no obvious tenderness. Normal bowel sounds heard. Central nervous system: Alert and oriented. No focal neurological deficits. Extremities: Symmetric 5 x 5 power.   Data Reviewed: Basic Metabolic Panel:  Recent Labs Lab 04/17/13 0504 04/18/13 0430 04/19/13 0430 04/21/13 0505 04/22/13 0555 04/23/13 0415  NA 133* 136 138  --  138 138  K 4.1 4.0 4.0  --  3.5 3.5  CL 103 105 104  --  107 106  CO2 22 23 23   --  23 24  GLUCOSE 101* 97 67*  --  115* 91  BUN 3* 3* 3*  --  <3* 3*  CREATININE 0.57 0.55 0.60 0.54 0.54 0.59  CALCIUM 8.5 8.3* 9.0  --  8.4 8.5  MG  --   --   --   --  1.7 1.8  PHOS  --   --   --   --  3.6 4.1   Liver Function Tests:  Recent Labs Lab 04/18/13 0430 04/22/13 0555  AST 23 22  ALT 18 20  ALKPHOS 69 68  BILITOT 0.4 0.2*  PROT 6.4 6.5  ALBUMIN 2.5* 2.7*    Recent Labs Lab 04/17/13 0504 04/18/13 0430  LIPASE 28 11   No results found for this basename: AMMONIA,  in the last 168 hours CBC:  Recent Labs Lab 04/17/13 0504 04/18/13 0430 04/21/13 0505 04/22/13 0555  WBC 9.5 7.0 6.6 6.7  NEUTROABS  --   --   --  3.4   HGB 11.2* 10.2* 10.7* 10.4*  HCT 33.8* 31.9* 33.2* 32.3*  MCV 86.9 88.1 87.8 87.8  PLT 174 183 264 276   Cardiac Enzymes: No results found for this basename: CKTOTAL, CKMB, CKMBINDEX, TROPONINI,  in the last 168 hours BNP (last 3 results) No results found for this basename: PROBNP,  in the last 8760 hours CBG:  Recent Labs Lab 04/22/13 1624 04/22/13 2008 04/22/13 2354 04/23/13 0259 04/23/13 0726  GLUCAP 103* 101* 81 97 100*    Recent Results (from the past 240 hour(s))  SURGICAL PCR SCREEN     Status: None   Collection Time    04/15/13 12:56 AM      Result Value Range Status   MRSA, PCR NEGATIVE  NEGATIVE Final   Staphylococcus aureus NEGATIVE  NEGATIVE Final   Comment:            The Xpert SA Assay (FDA     approved for NASAL specimens     in patients over 48 years of age),     is one component of  a comprehensive surveillance     program.  Test performance has     been validated by [facility] for patients greater     than or equal to 56 year old.     It is not intended     to diagnose infection nor to     guide or monitor treatment.      Additional labs: 1. Diagnosis FINE NEEDLE ASPIRATION, ENDOSCOPIC, PANCREAS CYST FLUID: NO MALIGNANT CELLS IDENTIFIED. FINDINGS CONSISTENT WITH THE CONTENTS OF A CYST.     Studies: Ct Head Wo Contrast  04/22/2013   *RADIOLOGY REPORT*  Clinical Data: Nausea and vomiting.  CT HEAD WITHOUT CONTRAST  Technique:  Contiguous axial images were obtained from the base of the skull through the vertex without contrast.  Comparison: None.  Findings: Extensive hypodensity throughout the cerebral white matter bilaterally.  This is primarily subcortical in location and is symmetric and bilateral.  Ventricle size is normal.  Cerebral volume is normal.  Brainstem and cerebellum are normal by CT.  Negative for hemorrhage.  IMPRESSION: Extensive leukoencephalopathy.  No prior studies available to determine if this is acute or chronic.   Differential diagnosis includes multiple sclerosis and chronic ischemia.  Hypertensive and metabolic encephalopathy are possible.  MRI brain without with contrast is suggested for further evaluation.  I discussed the findings by telephone with Dr. Waymon Amato   Original Report Authenticated By: Janeece Riggers, M.D.   Mr Laqueta Jean Wo Contrast  04/22/2013   *RADIOLOGY REPORT*  Clinical Data: Abnormal head CT.  Nausea and vomiting.  History of pancreatitis.  Terminal ileitis.  Protein calorie malnutrition.  MRI HEAD WITHOUT AND WITH CONTRAST  Technique:  Multiplanar, multiecho pulse sequences of the brain and surrounding structures were obtained according to standard protocol without and with intravenous contrast  Contrast: 20mL MULTIHANCE GADOBENATE DIMEGLUMINE 529 MG/ML IV SOLN  Comparison: 04/22/2013 head CT.  No comparison brain MR.  Findings: Sequences are motion degraded.  No acute infarct.  No intracranial hemorrhage.  Diffuse white matter type changes throughout the supratentorial region as well as involving the pons.  Etiology indeterminate. Considerations include white matter type changes secondary to; demyelinating process (multiple sclerosis), small vessel disease, inflammatory process, vasculitis, metabolic abnormality, result of drug therapy or toxic exposure or malnutrition.  Sub centimeter cystic structure posterior right opercular region with surrounding T2 altered signal intensity without enhancement. This may reflect the same process causing white matter changes. Overall, no abnormal intracranial enhancing lesion.  No hydrocephalus or significant atrophy.  Major intracranial vascular structures are patent.  Cervical medullary junction, pituitary region, pineal region and orbital structures unremarkable.  IMPRESSION: Diffuse nonspecific white matter type changes as detailed above.   Original Report Authenticated By: Lacy Duverney, M.D.        Scheduled Meds: . [START ON 04/24/2013] cosyntropin  0.25 mg  Intravenous Once  . enoxaparin (LOVENOX) injection  60 mg Subcutaneous Q24H  . fentaNYL  25 mcg Transdermal Q72H  . insulin aspart  0-9 Units Subcutaneous Q4H  . lipase/protease/amylase  2 capsule Oral TID AC  . pantoprazole (PROTONIX) IV  40 mg Intravenous Daily  . promethazine  25 mg Oral Q6H  . scopolamine  1 patch Transdermal Q72H   Continuous Infusions: . dextrose 5 % and 0.9 % NaCl with KCl 20 mEq/L 20 mL/hr at 04/22/13 1800  . dextrose 5 % and 0.9 % NaCl with KCl 20 mEq/L    . Marland KitchenTPN (CLINIMIX-E) Adult  And  . fat emulsion    . Marland KitchenTPN (CLINIMIX-E) Adult 60 mL/hr at 04/22/13 1753   And  . fat emulsion 250 mL (04/22/13 1754)    Principal Problem:   Chronic pancreatitis with chronic pseudocysts Active Problems:   Obesity, morbid   Pancreatic cyst   history of recurrent ideopathic pancreatitis   Pancreatitis, acute   Protein-calorie malnutrition, severe   Anemia   Diarrhea   Persistent vomiting   ? Adrenal insufficiency    Time spent: 35 minutes    [facility]  [facility] Pager [phone].   If 8PM-8AM, please contact night-coverage at [email or web address], password [facility] 04/23/2013, 11:29 AM  LOS: 9 days

## 2013-04-23 NOTE — Consult Note (Signed)
Reason for Consult: Abnormal MRI Referring Physician: Dr Waymon Amato  CC: Pancreatitis  HPI: Mary [name] is a 37 y.o. female admitted to [facility]  On August 28th for evaluation of diarrhea, nausea and vomiting associated with pancreatitis. The patient has a history of pancreatitis and a pancreatic cyst of unknown etiology. During this admission a decision was made to perform a CT scan of the head as well as an MRI. The CT scan performed 04/22/2013 revealed extensive hypodensity throughout the cerebral white matter bilaterally. This was noted to be primarily subcortical in location and was symmetric and bilateral. The MRI also performed on 04/22/2013 showed extensive leukoencephalopathy. No prior studies were available to determine if this was acute or chronic.  The differential diagnosis  includes multiple sclerosis and chronic ischemia. Hypertensive and metabolic encephalopathy are also possibilities. A neurology consult was requested for further input and recommendations.    Past Medical History  Diagnosis Date  . Hernia 03-24-13    ventral hernia remains   . GERD (gastroesophageal reflux disease)   . Pancreatitis 03-24-13    2002, 2'2013, 03-14-13  . Pancreatic cyst 2002 onset  . Anxiety   . Depression     "recent breakup with partner of 15 yrs"  . Adrenal insufficiency 04/23/2013    ??    Past Surgical History  Procedure Laterality Date  . Abdominal surgery  ~ 2007    some sort of pancreatic cyst drainage.   . Cholecystectomy      ~ age 65-laparoscopic  . Adenoidectomy    . Abdominal surgery      stent to pancreatic cyst that became infected within 36 hours  . Eus N/A 04/14/2013    Procedure: UPPER ENDOSCOPIC ULTRASOUND (EUS) LINEAR;  Surgeon: Rachael Fee, MD;  Location: [facility] END[facility];  Service: Endoscopy;  Laterality: N/A;    Family History  Problem Relation Age of Onset  . Lupus Neg Hx   . Sarcoidosis Neg Hx   . Pancreatitis Neg Hx   . Colitis Maternal Aunt   .  Diabetes Mother   . Diabetes Father   . Diabetes      many family members   She has a brother with Tourette's. Her biological mother has diabetes. Her father has diabetes.  Social History:  reports that she has been smoking Cigarettes.  She has a 10 pack-year smoking history. She has never used smokeless tobacco. She reports that she does not drink alcohol or use illicit drugs. She recently returned to [place] after having lived in [place] for quite some time. She previously worked as a Nurse, adult. She had a falling out with her significant other.  Allergies  Allergen Reactions  . Penicillins Anaphylaxis  . Latex Itching  . Morphine And Related Nausea And Vomiting    Medications:  Scheduled: . [START ON 04/24/2013] cosyntropin  0.25 mg Intravenous Once  . enoxaparin (LOVENOX) injection  60 mg Subcutaneous Q24H  . fentaNYL  25 mcg Transdermal Q72H  . insulin aspart  0-9 Units Subcutaneous Q4H  . lipase/protease/amylase  2 capsule Oral TID AC  . pantoprazole (PROTONIX) IV  40 mg Intravenous Daily  . promethazine  25 mg Oral Q6H  . scopolamine  1 patch Transdermal Q72H    ROS: History obtained from the patient  General ROS: negative for - chills, fatigue, fever, night sweats, weight gain. Positive for weight loss and decreased appetite. Positive for decreased energy and stamina. Psychological ROS: negative for - behavioral disorder, hallucinations, memory  difficulties, mood swings or suicidal ideation. Positive for anxiety and depression. Ophthalmic ROS: negative for - blurry vision, double vision, eye pain or loss of vision ENT ROS: negative for - epistaxis, nasal discharge, oral lesions, sore throat, tinnitus or vertigo Allergy and Immunology ROS: negative for - hives or itchy/watery eyes Hematological and Lymphatic ROS: negative for - bleeding problems, bruising or swollen lymph nodes Endocrine ROS: negative for - galactorrhea, hair pattern changes,  polydipsia/polyuria or temperature intolerance Respiratory ROS: negative for - cough, hemoptysis, shortness of breath or wheezing Cardiovascular ROS: negative for - chest pain, dyspnea on exertion, edema or irregular heartbeat Gastrointestinal ROS: negative for - positive for abdominal pain, diarrhea, nausea and vomiting. Genito-Urinary ROS: negative for - dysuria, hematuria, incontinence or urinary frequency/urgency. Positive for urinary retention. Musculoskeletal ROS: negative for - joint swelling or muscular weakness. Positive for occasional right hip pain. Neurological ROS: as noted in HPI Dermatological ROS: negative for rash and skin lesion changes   Physical Examination: Blood pressure 105/67, pulse 80, temperature 97.5 F (36.4 C), temperature source Oral, resp. rate 18, height 5\' 4"  (1.626 m), weight 111.3 kg (245 lb 6 oz), last menstrual period 04/15/2013, SpO2 96.00%.   General - depressed appearing 37 year old female in no acute distress. Heart - Regular rate and rhythm - no murmer Lungs - Clear to auscultation Abdomen - Soft - diffusely tender Extremities - Distal pulses intact - no edema Skin - Warm and dry  Neurologic Examination Mental Status: Alert, oriented, thought content appropriate.  Speech fluent without evidence of aphasia.  Able to follow 3 step commands without difficulty. Cranial Nerves: II: Discs not visualized; Visual fields grossly normal, pupils equal, round, reactive to light and accommodation III,IV, VI: Mild bilateral ptosis present, extra-ocular motions intact bilaterally V,VII: smile symmetric, facial light touch sensation normal bilaterally VIII: hearing normal bilaterally IX,X: gag reflex present XI: bilateral shoulder shrug XII: midline tongue extension Motor: Right : Upper extremity   5/5    Left:     Upper extremity   5/5  Lower extremity   4/5 In hip flexion and knee extension,flexion.  Lower extremity   5/5 Tone and bulk:normal tone  throughout; no atrophy noted Sensory: Pinprick and light touch intact throughout, bilaterally Deep Tendon Reflexes: 3+ and symmetric throughout Plantars: Right: downgoing   Left: downgoing Cerebellar: normal finger-to-nose, normal rapid alternating movements and normal heel-to-shin test   Laboratory Studies:   Basic Metabolic Panel:  Recent Labs Lab 04/17/13 0504 04/18/13 0430 04/19/13 0430 04/21/13 0505 04/22/13 0555 04/23/13 0415  NA 133* 136 138  --  138 138  K 4.1 4.0 4.0  --  3.5 3.5  CL 103 105 104  --  107 106  CO2 22 23 23   --  23 24  GLUCOSE 101* 97 67*  --  115* 91  BUN 3* 3* 3*  --  <3* 3*  CREATININE 0.57 0.55 0.60 0.54 0.54 0.59  CALCIUM 8.5 8.3* 9.0  --  8.4 8.5  MG  --   --   --   --  1.7 1.8  PHOS  --   --   --   --  3.6 4.1    Liver Function Tests:  Recent Labs Lab 04/18/13 0430 04/22/13 0555  AST 23 22  ALT 18 20  ALKPHOS 69 68  BILITOT 0.4 0.2*  PROT 6.4 6.5  ALBUMIN 2.5* 2.7*    Recent Labs Lab 04/17/13 0504 04/18/13 0430  LIPASE 28 11   No results found  for this basename: AMMONIA,  in the last 168 hours  CBC:  Recent Labs Lab 04/17/13 0504 04/18/13 0430 04/21/13 0505 04/22/13 0555  WBC 9.5 7.0 6.6 6.7  NEUTROABS  --   --   --  3.4  HGB 11.2* 10.2* 10.7* 10.4*  HCT 33.8* 31.9* 33.2* 32.3*  MCV 86.9 88.1 87.8 87.8  PLT 174 183 264 276    Cardiac Enzymes: No results found for this basename: CKTOTAL, CKMB, CKMBINDEX, TROPONINI,  in the last 168 hours  BNP: No components found with this basename: POCBNP,   CBG:  Recent Labs Lab 04/22/13 2008 04/22/13 2354 04/23/13 0259 04/23/13 0726 04/23/13 1154  GLUCAP 101* 81 97 100* 76    Microbiology: Results for orders placed during the hospital encounter of 04/14/13  SURGICAL PCR SCREEN     Status: None   Collection Time    04/15/13 12:56 AM      Result Value Range Status   MRSA, PCR NEGATIVE  NEGATIVE Final   Staphylococcus aureus NEGATIVE  NEGATIVE Final    Comment:            The Xpert SA Assay (FDA     approved for NASAL specimens     in patients over 83 years of age),     is one component of     a comprehensive surveillance     program.  Test performance has     been validated by [employer] for patients greater     than or equal to 87 year old.     It is not intended     to diagnose infection nor to     guide or monitor treatment.    Coagulation Studies: No results found for this basename: LABPROT, INR,  in the last 72 hours  Urinalysis: No results found for this basename: COLORURINE, APPERANCEUR, LABSPEC, PHURINE, GLUCOSEU, HGBUR, BILIRUBINUR, KETONESUR, PROTEINUR, UROBILINOGEN, NITRITE, LEUKOCYTESUR,  in the last 168 hours  Lipid Panel:     Component Value Date/Time   TRIG 178* 04/22/2013 0555    HgbA1C:  No results found for this basename: HGBA1C    Urine Drug Screen:   No results found for this basename: labopia, cocainscrnur, labbenz, amphetmu, thcu, labbarb    Alcohol Level: No results found for this basename: ETH,  in the last 168 hours  Other results:  EKG - not performed  Imaging:  Ct Head Wo Contrast 04/22/2013 Extensive leukoencephalopathy.  No prior studies available to determine if this is acute or chronic.  Differential diagnosis includes multiple sclerosis and chronic ischemia.  Hypertensive and metabolic encephalopathy are possible.    Mr Laqueta Jean Wo Contrast 04/22/2013   Diffuse nonspecific white matter type changes as detailed above.   Delton See PA-C [facility] Pager [phone] 04/23/2013, 3:49 PM   Assessment/Plan: 37 yo F with diffuse white matter change of unclear etiology. Micronutrient deficiency is certainly a possibility in this patient with longstanding GI issues. She has not had any neurological symptoms to suggest multiple sclerosis, though the MRI could be suggestive of this disease. I feel that the changes are most likely chronic given the lack of acute  symptos and unrelated to her current nausea/vomiting.   1) Lumbar puncture for oligoclonal bands, CSF protein and glucose, cell count and diff 2) I would favor follow up with repeat imaging in 3 - 6 months to assess for stability of these lesions.  3) If  no other cause for adrenal insufficiency is found, it may be worthwhile to send very long chain fatty acids, though this is not common and there is rarely adrenal insufficiency in females.    Ritta Slot, MD [facility] [phone]  If 7pm- 7am, please page neurology on call at [phone].

## 2013-04-23 NOTE — Progress Notes (Signed)
PARENTERAL NUTRITION CONSULT NOTE - FOLLOW UP  Pharmacy Consult for TNA Indication: chronic/complicated pancreatitis w/ pancreatic cyst, poor po intake  Allergies  Allergen Reactions  . Penicillins Anaphylaxis  . Latex Itching  . Morphine And Related Nausea And Vomiting    Patient Measurements: Height: 5\' 4"  (162.6 cm) Weight: 245 lb 6 oz (111.3 kg) IBW/kg (Calculated) : 54.7  Vital Signs: Temp: 97.2 F (36.2 C) (09/06 0449) Temp src: Oral (09/06 0449) BP: 96/60 mmHg (09/06 0449) Pulse Rate: 68 (09/06 0449) Intake/Output from previous day: 09/05 0701 - 09/06 0700 In: 513 [P.O.:120; I.V.:174.7; TPN:218.3] Out: 1100 [Urine:1100] Intake/Output from this shift:    Labs:  Recent Labs  04/21/13 0505 04/22/13 0555  WBC 6.6 6.7  HGB 10.7* 10.4*  HCT 33.2* 32.3*  PLT 264 276     Recent Labs  04/21/13 0505 04/22/13 0555 04/23/13 0415  NA  --  138 138  K  --  3.5 3.5  CL  --  107 106  CO2  --  23 24  GLUCOSE  --  115* 91  BUN  --  <3* 3*  CREATININE 0.54 0.54 0.59  CALCIUM  --  8.4 8.5  MG  --  1.7 1.8  PHOS  --  3.6 4.1  PROT  --  6.5  --   ALBUMIN  --  2.7*  --   AST  --  22  --   ALT  --  20  --   ALKPHOS  --  68  --   BILITOT  --  0.2*  --   PREALBUMIN  --  9.7*  --   TRIG  --  178*  --    Estimated Creatinine Clearance: 119.8 ml/min (by C-G formula based on Cr of 0.59).    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 Pt's cyst enlarged recently and pt underwent US and bx  04/14/13. Pt developed pain/N/V post-procedure. Surgery on board, recommended definitive treatment at a tertiary center. Pt with decreased PO intake - TNA ordered to start 9/5.  Anticipate patient will go home on TNA until pt can be seen at tertiary center.    Today, 9/6, is TNA day# 3 via double lumen PICC. Nausea continues as pre-TNA. Note TNA was stopped yesterday for MRI and could not be resumed until 1800 with the next bag (not sure how long). Other than that, pt tolerated TNA well.  Current nutrition:  - Diet: Clear liquid starting 9/3 - TNA: Clinimix E 5/15 @ 60 ml/hr + IVFE 20% @ 10 ml/hr - mIVF: D5NS + 74meq/L KCl at 50 ml/hr   Labs: Electrolytes:  wnl Renal Function: Scr stable/wnl, UOP 0.4 ml/kg/hr Hepatic Function: wnl Pre-Albumin: 9.7 on 9/5 Triglycerides: 178 on 9/5 CBGs: controlled, has SSI on board   Plan:  At 1800 tonight  Increase Clinimix E 5/15 to goal of 75 ml/hr  TNA to contain IV fat emulsion 20% at 10 ml/hr daily  Standard multivitamins and trace elements daily  Decrease IVF to [facility]  Continue SSI - f/u and reduce frequency as needed  Plan to transition to cyclic TNA after goal rate achieved (note that this conversion can be done outpt)  TNA labs Monday/Thursdays  Pharmacy will follow up daily  Geoffry Paradise, PharmD, BCPS Pager: [phone] 8:05 AM Pharmacy #: [id number]

## 2013-04-23 NOTE — Progress Notes (Signed)
Subjective: Feels a little better.  Less nausea.  Objective: Vital signs in last 24 hours: Temp:  [97.2 F (36.2 C)-97.9 F (36.6 C)] 97.2 F (36.2 C) (09/06 0449) Pulse Rate:  [68-72] 68 (09/06 0449) Resp:  [14-18] 14 (09/06 0449) BP: (96-113)/(60-77) 96/60 mmHg (09/06 0449) SpO2:  [92 %-99 %] 92 % (09/06 0449) Last BM Date: 04/21/13  Intake/Output from previous day: 09/05 0701 - 09/06 0700 In: 513 [P.O.:120; I.V.:174.7; TPN:218.3] Out: 1100 [Urine:1100] Intake/Output this shift:    PE: General- In NAD Abdomen-soft, minimal epigastric tenderness  Lab Results:   Recent Labs  04/21/13 0505 04/22/13 0555  WBC 6.6 6.7  HGB 10.7* 10.4*  HCT 33.2* 32.3*  PLT 264 276   BMET  Recent Labs  04/22/13 0555 04/23/13 0415  NA 138 138  K 3.5 3.5  CL 107 106  CO2 23 24  GLUCOSE 115* 91  BUN <3* 3*  CREATININE 0.54 0.59  CALCIUM 8.4 8.5   PT/INR No results found for this basename: LABPROT, INR,  in the last 72 hours Comprehensive Metabolic Panel:    Component Value Date/Time   NA 138 04/23/2013 0415   K 3.5 04/23/2013 0415   CL 106 04/23/2013 0415   CO2 24 04/23/2013 0415   BUN 3* 04/23/2013 0415   CREATININE 0.59 04/23/2013 0415   GLUCOSE 91 04/23/2013 0415   CALCIUM 8.5 04/23/2013 0415   AST 22 04/22/2013 0555   ALT 20 04/22/2013 0555   ALKPHOS 68 04/22/2013 0555   BILITOT 0.2* 04/22/2013 0555   PROT 6.5 04/22/2013 0555   ALBUMIN 2.7* 04/22/2013 0555     Studies/Results: Ct Head Wo Contrast  04/22/2013   *RADIOLOGY REPORT*  Clinical Data: Nausea and vomiting.  CT HEAD WITHOUT CONTRAST  Technique:  Contiguous axial images were obtained from the base of the skull through the vertex without contrast.  Comparison: None.  Findings: Extensive hypodensity throughout the cerebral white matter bilaterally.  This is primarily subcortical in location and is symmetric and bilateral.  Ventricle size is normal.  Cerebral volume is normal.  Brainstem and cerebellum are normal by CT.  Negative  for hemorrhage.  IMPRESSION: Extensive leukoencephalopathy.  No prior studies available to determine if this is acute or chronic.  Differential diagnosis includes multiple sclerosis and chronic ischemia.  Hypertensive and metabolic encephalopathy are possible.  MRI brain without with contrast is suggested for further evaluation.  I discussed the findings by telephone with Dr. Waymon Amato   Original Report Authenticated By: Janeece Riggers, M.D.   Mr Laqueta Jean Wo Contrast  04/22/2013   *RADIOLOGY REPORT*  Clinical Data: Abnormal head CT.  Nausea and vomiting.  History of pancreatitis.  Terminal ileitis.  Protein calorie malnutrition.  MRI HEAD WITHOUT AND WITH CONTRAST  Technique:  Multiplanar, multiecho pulse sequences of the brain and surrounding structures were obtained according to standard protocol without and with intravenous contrast  Contrast: 20mL MULTIHANCE GADOBENATE DIMEGLUMINE 529 MG/ML IV SOLN  Comparison: 04/22/2013 head CT.  No comparison brain MR.  Findings: Sequences are motion degraded.  No acute infarct.  No intracranial hemorrhage.  Diffuse white matter type changes throughout the supratentorial region as well as involving the pons.  Etiology indeterminate. Considerations include white matter type changes secondary to; demyelinating process (multiple sclerosis), small vessel disease, inflammatory process, vasculitis, metabolic abnormality, result of drug therapy or toxic exposure or malnutrition.  Sub centimeter cystic structure posterior right opercular region with surrounding T2 altered signal intensity without enhancement. This may reflect the  same process causing white matter changes. Overall, no abnormal intracranial enhancing lesion.  No hydrocephalus or significant atrophy.  Major intracranial vascular structures are patent.  Cervical medullary junction, pituitary region, pineal region and orbital structures unremarkable.  IMPRESSION: Diffuse nonspecific white matter type changes as detailed  above.   Original Report Authenticated By: Lacy Duverney, M.D.    Anti-infectives: Anti-infectives   Start     Dose/Rate Route Frequency Ordered Stop   04/14/13 2200  ciprofloxacin (CIPRO) IVPB 400 mg     400 mg 200 mL/hr over 60 Minutes Intravenous Every 12 hours 04/14/13 2140 04/17/13 1121      Assessment Principal Problem:   Chronic pancreatitis with chronic pseudocysts and acute exacerbation-a little better today. Active Problems:   Obesity, morbid   Protein-calorie malnutrition, severe      LOS: 9 days   Plan:  TPN for nutrition.  Discharge when symptoms under control.   Naim Murtha J 04/23/2013

## 2013-04-24 DIAGNOSIS — R9389 Abnormal findings on diagnostic imaging of other specified body structures: Secondary | ICD-10-CM

## 2013-04-24 LAB — GLUCOSE, CAPILLARY
Glucose-Capillary: 113 mg/dL — ABNORMAL HIGH (ref 70–99)
Glucose-Capillary: 118 mg/dL — ABNORMAL HIGH (ref 70–99)
Glucose-Capillary: 132 mg/dL — ABNORMAL HIGH (ref 70–99)
Glucose-Capillary: 119 mg/dL — ABNORMAL HIGH (ref 70–99)
Glucose-Capillary: 110 mg/dL — ABNORMAL HIGH (ref 70–99)

## 2013-04-24 MED ORDER — ONDANSETRON HCL 4 MG/2ML IJ SOLN
4.0000 mg | INTRAMUSCULAR | Status: DC | PRN
  Administered 2013-04-24 – 2013-04-25 (×3): 4 mg via INTRAVENOUS
  Filled 2013-04-24 (×4): qty 2

## 2013-04-24 MED ORDER — INSULIN ASPART 100 UNIT/ML ~~LOC~~ SOLN: 0.0000 [IU] | Freq: Four times a day (QID) | SUBCUTANEOUS | Status: DC

## 2013-04-24 MED ORDER — ONDANSETRON HCL 4 MG PO TABS: 4.0000 mg | ORAL_TABLET | ORAL | Status: DC | PRN

## 2013-04-24 MED ORDER — FAT EMULSION 20 % IV EMUL
240.0000 mL | INTRAVENOUS | Status: AC
  Administered 2013-04-24: 240 mL via INTRAVENOUS
  Filled 2013-04-24 (×2): qty 250

## 2013-04-24 MED ORDER — INSULIN ASPART 100 UNIT/ML ~~LOC~~ SOLN: 0.0000 [IU] | SUBCUTANEOUS | Status: DC

## 2013-04-24 MED ORDER — TRACE MINERALS CR-CU-F-FE-I-MN-MO-SE-ZN IV SOLN
INTRAVENOUS | Status: AC
  Administered 2013-04-24: 18:00:00 via INTRAVENOUS
  Filled 2013-04-24: qty 2000

## 2013-04-24 MED ORDER — FAT EMULSION 20 % IV EMUL
240.0000 mL | INTRAVENOUS | Status: DC
  Filled 2013-04-24: qty 250

## 2013-04-24 MED ORDER — TRACE MINERALS CR-CU-F-FE-I-MN-MO-SE-ZN IV SOLN
INTRAVENOUS | Status: DC
  Filled 2013-04-24: qty 2000

## 2013-04-24 NOTE — Progress Notes (Addendum)
PARENTERAL NUTRITION CONSULT NOTE - FOLLOW UP  Pharmacy Consult for TNA Indication: chronic/complicated pancreatitis w/ pancreatic cyst, poor po intake  Allergies  Allergen Reactions  . Penicillins Anaphylaxis  . Latex Itching  . Morphine And Related Nausea And Vomiting    Patient Measurements: Height: 5\' 4"  (162.6 cm) Weight: 245 lb 6 oz (111.3 kg) IBW/kg (Calculated) : 54.7  Vital Signs: Temp: 98.1 F (36.7 C) (09/07 0610) Temp src: Oral (09/07 0610) BP: 111/66 mmHg (09/07 0610) Pulse Rate: 67 (09/07 0610) Intake/Output from previous day: 09/06 0701 - 09/07 0700 In: 711 [I.V.:711] Out: 1250 [Urine:1250] Intake/Output from this shift:    Labs:  Recent Labs  04/22/13 0555  WBC 6.7  HGB 10.4*  HCT 32.3*  PLT 276     Recent Labs  04/22/13 0555 04/23/13 0415  NA 138 138  K 3.5 3.5  CL 107 106  CO2 23 24  GLUCOSE 115* 91  BUN <3* 3*  CREATININE 0.54 0.59  CALCIUM 8.4 8.5  MG 1.7 1.8  PHOS 3.6 4.1  PROT 6.5  --   ALBUMIN 2.7*  --   AST 22  --   ALT 20  --   ALKPHOS 68  --   BILITOT 0.2*  --   PREALBUMIN 9.7*  --   TRIG 178*  --    Estimated Creatinine Clearance: 119.8 ml/min (by C-G formula based on Cr of 0.59).    Recent Labs  04/24/13 0001 04/24/13 0403 04/24/13 0740  GLUCAP 113* 118* 132*   CBGs & Insulin requirements past 24 hours:  - CBGs < 150, required 1 unit of sensitive SSI   Nutritional Goals:  - RD recs 9/4: 1650-1850 kCal, 80-100 g/protein per day - Clinimix E 5/15 @ 75 ml/hr + IVF 20% at 10 ml/hr will provide 90gm protein and 1758 kCal per day   Assessment:  18 yof with h/o recurrent pancreatitis, pancreatitic cystic mass s/p stent in 2007, s/p removal given infection and Roux en Y cystojejunostomy at [facility] in October of 2008. Pt's cyst enlarged recently and pt underwent US and bx 04/14/13. Pt developed pain/N/V post-procedure. Surgery on board, recommended definitive treatment at a tertiary center. Pt with decreased PO intake  - TNA ordered to start 9/5.  Anticipate patient will go home on TNA until pt can be seen at tertiary center.    9/7: Today is TNA day# 4 via double lumen PICC. N/V improved. Pt tolerated TNA well. Discharge when symptoms under control per Surgery.   Current nutrition:  - Diet: Clear liquid starting 9/3 - TNA: Clinimix E 5/15 @ 60 ml/hr + IVFE 20% @ 10 ml/hr - mIVF: D5NS + 24meq/L KCl at [facility]  Labs: (no new labs 9/7) Electrolytes:  wnl Renal Function: Scr stable/wnl, UOP 0.5 ml/kg/hr Hepatic Function: wnl Pre-Albumin: 9.7 on 9/5 Triglycerides: 178 on 9/5 CBGs: controlled, has SSI on board   Plan:  At 1800 tonight  Continue Clinimix E 5/15 at goal rate of 75 ml/hr  TNA to contain IV fat emulsion 20% at 10 ml/hr daily  Standard multivitamins and trace elements daily  Continue IVF at [facility] and sensitive SSI with CBGs checks  Awaiting MD orders to cycle TNA in preparation for discharge (conversion can also be done outpatient)  TNA labs Monday/Thursdays  Pharmacy will follow up daily  Geoffry Paradise, PharmD, BCPS Pager: [phone] 8:29 AM Pharmacy #: [id number]    Addendum:  Received telephone order from Dr. Waymon Amato to start cycling TNA.   Plan:   -  Cycle TNA (1800 ml total) over 18 hours @1800 -1900: 50 ml/hr @1900 -1100: 106 ml/hr @1100 -1200: 50 ml/hr -IVFE over 18 hours at 13 ml/hr -CBGs at 0800, 1300, 1600, 2200 -Keep IVF at [facility], PharmD, BCPS Pager: [phone] 2:17 PM Pharmacy #: [id number]

## 2013-04-24 NOTE — Progress Notes (Signed)
Subjective: Pain and nausea are the same as yesterday.  Objective: Vital signs in last 24 hours: Temp:  [97.5 F (36.4 C)-98.1 F (36.7 C)] 98.1 F (36.7 C) (09/07 0610) Pulse Rate:  [67-80] 67 (09/07 0610) Resp:  [18] 18 (09/07 0610) BP: (105-111)/(66-69) 111/66 mmHg (09/07 0610) SpO2:  [94 %-96 %] 94 % (09/07 0610) Last BM Date: 04/23/13  Intake/Output from previous day: 09/06 0701 - 09/07 0700 In: 711 [I.V.:711] Out: 1250 [Urine:1250] Intake/Output this shift:    PE: General- In NAD Abdomen-soft, minimal epigastric tenderness  Lab Results:   Recent Labs  04/22/13 0555  WBC 6.7  HGB 10.4*  HCT 32.3*  PLT 276   BMET  Recent Labs  04/22/13 0555 04/23/13 0415  NA 138 138  K 3.5 3.5  CL 107 106  CO2 23 24  GLUCOSE 115* 91  BUN <3* 3*  CREATININE 0.54 0.59  CALCIUM 8.4 8.5   PT/INR No results found for this basename: LABPROT, INR,  in the last 72 hours Comprehensive Metabolic Panel:    Component Value Date/Time   NA 138 04/23/2013 0415   K 3.5 04/23/2013 0415   CL 106 04/23/2013 0415   CO2 24 04/23/2013 0415   BUN 3* 04/23/2013 0415   CREATININE 0.59 04/23/2013 0415   GLUCOSE 91 04/23/2013 0415   CALCIUM 8.5 04/23/2013 0415   AST 22 04/22/2013 0555   ALT 20 04/22/2013 0555   ALKPHOS 68 04/22/2013 0555   BILITOT 0.2* 04/22/2013 0555   PROT 6.5 04/22/2013 0555   ALBUMIN 2.7* 04/22/2013 0555     Studies/Results: Ct Head Wo Contrast  04/22/2013   *RADIOLOGY REPORT*  Clinical Data: Nausea and vomiting.  CT HEAD WITHOUT CONTRAST  Technique:  Contiguous axial images were obtained from the base of the skull through the vertex without contrast.  Comparison: None.  Findings: Extensive hypodensity throughout the cerebral white matter bilaterally.  This is primarily subcortical in location and is symmetric and bilateral.  Ventricle size is normal.  Cerebral volume is normal.  Brainstem and cerebellum are normal by CT.  Negative for hemorrhage.  IMPRESSION: Extensive  leukoencephalopathy.  No prior studies available to determine if this is acute or chronic.  Differential diagnosis includes multiple sclerosis and chronic ischemia.  Hypertensive and metabolic encephalopathy are possible.  MRI brain without with contrast is suggested for further evaluation.  I discussed the findings by telephone with Dr. Waymon Amato   Original Report Authenticated By: Janeece Riggers, M.D.   Mr Laqueta Jean Wo Contrast  04/22/2013   *RADIOLOGY REPORT*  Clinical Data: Abnormal head CT.  Nausea and vomiting.  History of pancreatitis.  Terminal ileitis.  Protein calorie malnutrition.  MRI HEAD WITHOUT AND WITH CONTRAST  Technique:  Multiplanar, multiecho pulse sequences of the brain and surrounding structures were obtained according to standard protocol without and with intravenous contrast  Contrast: 20mL MULTIHANCE GADOBENATE DIMEGLUMINE 529 MG/ML IV SOLN  Comparison: 04/22/2013 head CT.  No comparison brain MR.  Findings: Sequences are motion degraded.  No acute infarct.  No intracranial hemorrhage.  Diffuse white matter type changes throughout the supratentorial region as well as involving the pons.  Etiology indeterminate. Considerations include white matter type changes secondary to; demyelinating process (multiple sclerosis), small vessel disease, inflammatory process, vasculitis, metabolic abnormality, result of drug therapy or toxic exposure or malnutrition.  Sub centimeter cystic structure posterior right opercular region with surrounding T2 altered signal intensity without enhancement. This may reflect the same process causing white matter changes. Overall,  no abnormal intracranial enhancing lesion.  No hydrocephalus or significant atrophy.  Major intracranial vascular structures are patent.  Cervical medullary junction, pituitary region, pineal region and orbital structures unremarkable.  IMPRESSION: Diffuse nonspecific white matter type changes as detailed above.   Original Report Authenticated By:  Lacy Duverney, M.D.    Anti-infectives: Anti-infectives   Start     Dose/Rate Route Frequency Ordered Stop   04/14/13 2200  ciprofloxacin (CIPRO) IVPB 400 mg     400 mg 200 mL/hr over 60 Minutes Intravenous Every 12 hours 04/14/13 2140 04/17/13 1121      Assessment Principal Problem:   Chronic pancreatitis with chronic pseudocysts and acute exacerbation- no significant change Active Problems:   Obesity, morbid   Protein-calorie malnutrition, severe      LOS: 10 days   Plan:  TPN for nutrition.  Discharge when symptoms under control.   Dhani Imel J 04/24/2013

## 2013-04-24 NOTE — Progress Notes (Signed)
[facility] Progress Note  Subjective:  Says that she still cannot drink anything without vomiting so still only taking ice chips.  On TNA.  Still with 8/10 pain.  Objective:  Vital signs in last 24 hours: Temp:  [97.5 F (36.4 C)-98.1 F (36.7 C)] 98.1 F (36.7 C) (09/07 0610) Pulse Rate:  [67-80] 67 (09/07 0610) Resp:  [18] 18 (09/07 0610) BP: (105-111)/(66-69) 111/66 mmHg (09/07 0610) SpO2:  [94 %-96 %] 94 % (09/07 0610) Last BM Date: 04/23/13 General:   Alert, Well-developed, in NAD Heart:  Regular rate and rhythm; no murmurs Pulm:  CTAB.  No W/R/R. Abdomen:  Soft, non-distended.  BS present but quiet.  Upper abdominal TTP without R/R/G.  Extremities:  Without edema. Neurologic:  Alert and  oriented x4;  grossly normal neurologically. Psych:  Alert and cooperative. Normal mood and affect.  Intake/Output from previous day: 09/06 0701 - 09/07 0700 In: 711 [I.V.:711] Out: 1250 [Urine:1250]  Lab Results:  Recent Labs  04/22/13 0555  WBC 6.7  HGB 10.4*  HCT 32.3*  PLT 276   BMET  Recent Labs  04/22/13 0555 04/23/13 0415  NA 138 138  K 3.5 3.5  CL 107 106  CO2 23 24  GLUCOSE 115* 91  BUN <3* 3*  CREATININE 0.54 0.59  CALCIUM 8.4 8.5   LFT  Recent Labs  04/22/13 0555  PROT 6.5  ALBUMIN 2.7*  AST 22  ALT 20  ALKPHOS 68  BILITOT 0.2*   Ct Head Wo Contrast  04/22/2013   *RADIOLOGY REPORT*  Clinical Data: Nausea and vomiting.  CT HEAD WITHOUT CONTRAST  Technique:  Contiguous axial images were obtained from the base of the skull through the vertex without contrast.  Comparison: None.  Findings: Extensive hypodensity throughout the cerebral white matter bilaterally.  This is primarily subcortical in location and is symmetric and bilateral.  Ventricle size is normal.  Cerebral volume is normal.  Brainstem and cerebellum are normal by CT.  Negative for hemorrhage.  IMPRESSION: Extensive leukoencephalopathy.  No prior studies available to determine if  this is acute or chronic.  Differential diagnosis includes multiple sclerosis and chronic ischemia.  Hypertensive and metabolic encephalopathy are possible.  MRI brain without with contrast is suggested for further evaluation.  I discussed the findings by telephone with Dr. Waymon Amato   Original Report Authenticated By: Janeece Riggers, M.D.   Mr Laqueta Jean Wo Contrast  04/22/2013   *RADIOLOGY REPORT*  Clinical Data: Abnormal head CT.  Nausea and vomiting.  History of pancreatitis.  Terminal ileitis.  Protein calorie malnutrition.  MRI HEAD WITHOUT AND WITH CONTRAST  Technique:  Multiplanar, multiecho pulse sequences of the brain and surrounding structures were obtained according to standard protocol without and with intravenous contrast  Contrast: 20mL MULTIHANCE GADOBENATE DIMEGLUMINE 529 MG/ML IV SOLN  Comparison: 04/22/2013 head CT.  No comparison brain MR.  Findings: Sequences are motion degraded.  No acute infarct.  No intracranial hemorrhage.  Diffuse white matter type changes throughout the supratentorial region as well as involving the pons.  Etiology indeterminate. Considerations include white matter type changes secondary to; demyelinating process (multiple sclerosis), small vessel disease, inflammatory process, vasculitis, metabolic abnormality, result of drug therapy or toxic exposure or malnutrition.  Sub centimeter cystic structure posterior right opercular region with surrounding T2 altered signal intensity without enhancement. This may reflect the same process causing white matter changes. Overall, no abnormal intracranial enhancing lesion.  No hydrocephalus or significant atrophy.  Major intracranial vascular structures are patent.  Cervical medullary junction, pituitary region, pineal region and orbital structures unremarkable.  IMPRESSION: Diffuse nonspecific white matter type changes as detailed above.   Original Report Authenticated By: Lacy Duverney, M.D.    Assessment / Plan: 1) Persistent nausea  and vomiting  2) Abdominal pain  3) Recent pancreatitis after EUS aspiration of pancreatic cyst  4) Diffuse brain white matter changes - unclear etiology but has been seen by neurology and they think that it may be due to nutritional deficiencies from long-standing GI issues.  Getting LP tomorrow to rule out MS. 5) Cortisol level is 1.8 - very low - adrenal insufficiency.  Undergoing ACTH stimulation test.  This could very well be contributing to her nausea and vomiting.    LOS: 10 days   ZEHR, JESSICA D.  04/24/2013, 9:03 AM  Pager number [phone]  [facility] Attending  I agree with the above note. Waiting on cort stim results I think she has anemia of chronic disease and not iron-deficiency. At some point needs trial of IV hydrocortisone 100 mg IV q 8 hrs to see how she does. ? Endocrine input will be needed - I still wonder about other hormone issues. Not sure what other tests to do re: malnutrition and possible link to white matter changes,  but she is malnourished and probably for some time - since on TPN probably cannot test for any. Once she stops vomiting and take oral I suppose multivitamin would be needed. May be reasonable to test fat soluble vitamins given possible pancreatic deficiency.  Iva Boop, MD, [facility] [phone] (pager) 04/24/2013 2:16 PM

## 2013-04-24 NOTE — Progress Notes (Addendum)
[facility] PROGRESS NOTE  Mary [name] UVO:[id number] DOB: 12-Sep-1975 DOA: 04/14/2013 PCP: Dorrene German, MD  HPI/Brief narrative 37 year old female with history of recurrent pancreatitis and pancreatic cyst of unknown etiology, post cholecystectomy, does not drink alcohol, underwent extensive evaluation by GI and surgery at [facility] and underwent pancreatic stent endoscopically which was removed 36 hours later because it was infected, then underwent open surgery for? Small bowel to pancreas anastomosis to drain the cyst. Despite this she continued to have episodes of pancreatitis. Recently hospitalized July-August for pancreatitis and terminal ileitis with diarrhea. Discharge home on Cipro/Flagyl. Continued to have diarrhea. Underwent endoscopic ultrasound and biopsy of pancreatic cyst by Dr. Christella Hartigan on 8/28-16 mL of thick clear fluid removed-not sent for cultures. Admitted post procedure for worsening abdominal pain, elevated lipase. [facility] has consulted and requested surgical consult on 9/3.  Assessment/Plan:  Acute pancreatitis post EUS and FNA of pancreatic cyst, complicating chronic pancreatitis/pancreatic cyst/intractable nausea. - Patient continues to have nausea and abdominal pain. Continue symptomatic treatment-IV fluids, pain control and antiemetics. - GI consultation appreciated. Start by mouth pancreatic enzymes when able to tolerate. - Surgeons and GI consultation and followup appreciated. They recommend nonemergent outpatient evaluation at tertiary care center/Dr. Rise Mu at [facility] - No malignant cells on cytology. - Patient unable to tolerate oral intake without significant pain, nausea or vomiting. TPN started on 9/4. - Patient has persistent nausea and abdominal pain. Starting patient on scheduled oral Phenergan since scopolamine patch will not stay on and fentanyl patch. Monitor  - Shunt n.p.o. except ice chips.  - GI ordered CT head to rule out  other causes for nausea and vomiting. CT shows extensive leukoencephalopathy not in keeping with her young age. Please see below. - Nausea and abdominal pain are slightly better than yesterday. Patient states that she has 8/10 pain-however she does not appear in any pain during visits to her room in the last couple of days. Difficult to objectively assess severity of nausea or pain in this patient. - Urine pregnancy test negative.  Adrenal insufficiency - Check cosyntropin test - Check TSH, free T4 - Hemodynamically stable. Will not start steroids until about results back. - Undergoing cosyntropin test today. Follow results. - Could contribute to symptoms of nausea and vomiting.  Diarrhea x2 months/terminal ileitis - Had terminal ileitis on CT in July which was suspected to be infectious and completed treatment with Cipro and Flagyl. Still having intermittent diarrhea. - Terminal ileitis resolved on repeat CT this admission. - Resolved.  Anemia - No active bleeding. Stable. - Possible Iron deficiency. Start oral iron when able to tolerate.  Severe protein calorie malnutrition - Management per nutritionist.  - Unable to tolerate by mouth. Continue TPN.  Abnormal CT & MRI Head/Extensive leukoencephalopathy - Incidental finding. - Neurology consultation appreciated-indicates that her white matter changes on MRI brain could be secondary to nutrient deficiencies related to chronic GI issues and no neurological symptoms to suggest MS. Patient states she's undergoing LP on 9/8 and neurology recommends outpatient followup with repeat imaging in 3-6 months to assess for stability of these lesions.  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 Outpatient followup as deemed necessary.  DVT prophylaxis: Lovenox- patient has apparently refused since admission but MD not made aware by nursing. Ordered SCD's. Lines/catheters:  PICC Nutrition: N.p.o. [name] Code Status: Full Family Communication: None Disposition Plan: Home possibly in next 1-2 days on TPN and OP consultation  at [facility].   Consultants:  [facility]  General surgery  Neurology  Procedures:  PICC  Antibiotics:  None   Subjective: Continued complaints of significant nausea and abdominal pain. No diarrhea.  Objective: Filed Vitals:   04/23/13 0449 04/23/13 1400 04/23/13 2225 04/24/13 0610  BP: 96/60 105/67 109/69 111/66  Pulse: 68 80 69 67  Temp: 97.2 F (36.2 C) 97.5 F (36.4 C) 97.6 F (36.4 C) 98.1 F (36.7 C)  TempSrc: Oral Oral Oral Oral  Resp: 14 18 18 18   Height:      Weight:      SpO2: 92% 96% 94% 94%    Intake/Output Summary (Last 24 hours) at 04/24/13 1329 Last data filed at 04/24/13 1231  Gross per 24 hour  Intake 3918.08 ml  Output   1950 ml  Net 1968.08 ml   Filed Weights   04/14/13 2100  Weight: 111.3 kg (245 lb 6 oz)     Exam:  General exam: Moderately built and obese female patient sitting up in bed comfortably. Respiratory system: Clear. No increased work of breathing. Cardiovascular system: S1 & S2 heard, RRR. No JVD, murmurs, gallops, clicks or pedal edema. Gastrointestinal system: Abdomen is nondistended, soft and no obvious tenderness. Normal bowel sounds heard. Central nervous system: Alert and oriented. No focal neurological deficits. Extremities: Symmetric 5 x 5 power.   Data Reviewed: Basic Metabolic Panel:  Recent Labs Lab 04/18/13 0430 04/19/13 0430 04/21/13 0505 04/22/13 0555 04/23/13 0415  NA 136 138  --  138 138  K 4.0 4.0  --  3.5 3.5  CL 105 104  --  107 106  CO2 23 23  --  23 24  GLUCOSE 97 67*  --  115* 91  BUN 3* 3*  --  <3* 3*  CREATININE 0.55 0.60 0.54 0.54 0.59  CALCIUM 8.3* 9.0  --  8.4 8.5  MG  --   --   --  1.7 1.8  PHOS  --   --   --  3.6 4.1   Liver Function Tests:  Recent Labs Lab 04/18/13 0430 04/22/13 0555  AST 23 22  ALT 18 20  ALKPHOS 69 68   BILITOT 0.4 0.2*  PROT 6.4 6.5  ALBUMIN 2.5* 2.7*    Recent Labs Lab 04/18/13 0430  LIPASE 11   No results found for this basename: AMMONIA,  in the last 168 hours CBC:  Recent Labs Lab 04/18/13 0430 04/21/13 0505 04/22/13 0555  WBC 7.0 6.6 6.7  NEUTROABS  --   --  3.4  HGB 10.2* 10.7* 10.4*  HCT 31.9* 33.2* 32.3*  MCV 88.1 87.8 87.8  PLT 183 264 276   Cardiac Enzymes: No results found for this basename: CKTOTAL, CKMB, CKMBINDEX, TROPONINI,  in the last 168 hours BNP (last 3 results) No results found for this basename: PROBNP,  in the last 8760 hours CBG:  Recent Labs Lab 04/23/13 2013 04/24/13 0001 04/24/13 0403 04/24/13 0740 04/24/13 1206  GLUCAP 106* 113* 118* 132* 119*    Recent Results (from the past 240 hour(s))  SURGICAL PCR SCREEN     Status: None   Collection Time    04/15/13 12:56 AM      Result Value Range Status   MRSA, PCR NEGATIVE  NEGATIVE Final   Staphylococcus aureus NEGATIVE  NEGATIVE Final   Comment:            The Xpert SA Assay (FDA     approved for NASAL specimens  in patients over 64 years of age),     is one component of     a comprehensive surveillance     program.  Test performance has     been validated by [employer] for patients greater     than or equal to 5 year old.     It is not intended     to diagnose infection nor to     guide or monitor treatment.      Additional labs: 1. Diagnosis FINE NEEDLE ASPIRATION, ENDOSCOPIC, PANCREAS CYST FLUID: NO MALIGNANT CELLS IDENTIFIED. FINDINGS CONSISTENT WITH THE CONTENTS OF A CYST.       Studies: Mr Laqueta Jean JY Contrast  May 10, 2013   *RADIOLOGY REPORT*  Clinical Data: Abnormal head CT.  Nausea and vomiting.  History of pancreatitis.  Terminal ileitis.  Protein calorie malnutrition.  MRI HEAD WITHOUT AND WITH CONTRAST  Technique:  Multiplanar, multiecho pulse sequences of the brain and surrounding structures were obtained according to standard protocol without  and with intravenous contrast  Contrast: 20mL MULTIHANCE GADOBENATE DIMEGLUMINE 529 MG/ML IV SOLN  Comparison: May 10, 2013 head CT.  No comparison brain MR.  Findings: Sequences are motion degraded.  No acute infarct.  No intracranial hemorrhage.  Diffuse white matter type changes throughout the supratentorial region as well as involving the pons.  Etiology indeterminate. Considerations include white matter type changes secondary to; demyelinating process (multiple sclerosis), small vessel disease, inflammatory process, vasculitis, metabolic abnormality, result of drug therapy or toxic exposure or malnutrition.  Sub centimeter cystic structure posterior right opercular region with surrounding T2 altered signal intensity without enhancement. This may reflect the same process causing white matter changes. Overall, no abnormal intracranial enhancing lesion.  No hydrocephalus or significant atrophy.  Major intracranial vascular structures are patent.  Cervical medullary junction, pituitary region, pineal region and orbital structures unremarkable.  IMPRESSION: Diffuse nonspecific white matter type changes as detailed above.   Original Report Authenticated By: Lacy Duverney, M.D.        Scheduled Meds: . enoxaparin (LOVENOX) injection  60 mg Subcutaneous Q24H  . fentaNYL  25 mcg Transdermal Q72H  . insulin aspart  0-9 Units Subcutaneous Q4H  . lipase/protease/amylase  2 capsule Oral TID AC  . pantoprazole (PROTONIX) IV  40 mg Intravenous Daily  . promethazine  25 mg Oral Q6H  . scopolamine  1 patch Transdermal Q72H   Continuous Infusions: . dextrose 5 % and 0.9 % NaCl with KCl 20 mEq/L 20 mL/hr at 04/24/13 0301  . Marland KitchenTPN (CLINIMIX-E) Adult 75 mL/hr at 04/23/13 1741   And  . fat emulsion 240 mL (04/23/13 1742)  . Marland KitchenTPN (CLINIMIX-E) Adult     And  . fat emulsion      Principal Problem:   Chronic pancreatitis with chronic pseudocysts Active Problems:   Obesity, morbid   Pancreatic cyst   history of  recurrent ideopathic pancreatitis   Pancreatitis, acute   Protein-calorie malnutrition, severe   Anemia   Diarrhea   Persistent vomiting   ? Adrenal insufficiency   Abnormal MRI    Time spent: 15 minutes    [facility]  [facility] Pager [phone].   If 8PM-8AM, please contact night-coverage at [email or web address], password [facility] 04/24/2013, 1:29 PM  LOS: 10 days

## 2013-04-25 ENCOUNTER — Inpatient Hospital Stay (HOSPITAL_COMMUNITY): Payer: Self-pay

## 2013-04-25 DIAGNOSIS — R7989 Other specified abnormal findings of blood chemistry: Secondary | ICD-10-CM | POA: Diagnosis not present

## 2013-04-25 DIAGNOSIS — E2740 Unspecified adrenocortical insufficiency: Secondary | ICD-10-CM | POA: Diagnosis not present

## 2013-04-25 LAB — COMPREHENSIVE METABOLIC PANEL
Albumin: 2.7 g/dL — ABNORMAL LOW (ref 3.5–5.2)
BUN: 11 mg/dL (ref 6–23)
Chloride: 104 mEq/L (ref 96–112)
Creatinine, Ser: 0.52 mg/dL (ref 0.50–1.10)
Total Bilirubin: 0.2 mg/dL — ABNORMAL LOW (ref 0.3–1.2)

## 2013-04-25 LAB — PROTEIN AND GLUCOSE, CSF
Glucose, CSF: 69 mg/dL (ref 43–76)
Total  Protein, CSF: 25 mg/dL (ref 15–45)

## 2013-04-25 LAB — DIFFERENTIAL
Basophils Absolute: 0 10*3/uL (ref 0.0–0.1)
Basophils Relative: 1 % (ref 0–1)
Monocytes Relative: 11 % (ref 3–12)
Neutro Abs: 4.7 10*3/uL (ref 1.7–7.7)
Neutrophils Relative %: 53 % (ref 43–77)

## 2013-04-25 LAB — PHOSPHORUS: Phosphorus: 4.1 mg/dL (ref 2.3–4.6)

## 2013-04-25 LAB — CBC
Hemoglobin: 10.4 g/dL — ABNORMAL LOW (ref 12.0–15.0)
MCHC: 32.4 g/dL (ref 30.0–36.0)
Platelets: 276 10*3/uL (ref 150–400)
RDW: 14.2 % (ref 11.5–15.5)

## 2013-04-25 LAB — TRIGLYCERIDES: Triglycerides: 207 mg/dL — ABNORMAL HIGH (ref <150)

## 2013-04-25 LAB — GLUCOSE, CAPILLARY: Glucose-Capillary: 133 mg/dL — ABNORMAL HIGH (ref 70–99)

## 2013-04-25 LAB — MAGNESIUM: Magnesium: 2 mg/dL (ref 1.5–2.5)

## 2013-04-25 LAB — CSF CELL COUNT WITH DIFFERENTIAL

## 2013-04-25 LAB — ACTH STIMULATION, 3 TIME POINTS: Cortisol, Base: 8 ug/dL

## 2013-04-25 LAB — PREALBUMIN: Prealbumin: 9.2 mg/dL — ABNORMAL LOW (ref 17.0–34.0)

## 2013-04-25 MED ORDER — PROMETHAZINE HCL 25 MG/ML IJ SOLN
12.5000 mg | Freq: Four times a day (QID) | INTRAMUSCULAR | Status: DC
  Administered 2013-04-25: 12.5 mg via INTRAVENOUS
  Filled 2013-04-25: qty 1

## 2013-04-25 MED ORDER — FENTANYL 50 MCG/HR TD PT72
50.0000 ug | MEDICATED_PATCH | TRANSDERMAL | Status: DC
  Administered 2013-04-25: 50 ug via TRANSDERMAL
  Filled 2013-04-25: qty 1

## 2013-04-25 MED ORDER — TRACE MINERALS CR-CU-F-FE-I-MN-MO-SE-ZN IV SOLN
INTRAVENOUS | Status: AC
  Administered 2013-04-25: 18:00:00 via INTRAVENOUS
  Filled 2013-04-25: qty 2000

## 2013-04-25 MED ORDER — PROCHLORPERAZINE MALEATE 10 MG PO TABS
10.0000 mg | ORAL_TABLET | Freq: Four times a day (QID) | ORAL | Status: DC | PRN
Start: 2013-04-25 — End: 2013-04-26
  Filled 2013-04-25: qty 1

## 2013-04-25 MED ORDER — PROMETHAZINE HCL 25 MG/ML IJ SOLN
12.5000 mg | Freq: Four times a day (QID) | INTRAMUSCULAR | Status: DC | PRN
  Administered 2013-04-25 – 2013-04-26 (×3): 12.5 mg via INTRAVENOUS
  Filled 2013-04-25 (×3): qty 1

## 2013-04-25 MED ORDER — FAT EMULSION 20 % IV EMUL
250.0000 mL | INTRAVENOUS | Status: AC
  Administered 2013-04-25: 250 mL via INTRAVENOUS
  Filled 2013-04-25: qty 250

## 2013-04-25 NOTE — Progress Notes (Signed)
NEURO HOSPITALIST PROGRESS NOTE   SUBJECTIVE:                                                                                                                         Patient is to go for LP today,  Currently she has no complaints.  OBJECTIVE:                                                                                                                           Vital signs in last 24 hours: Temp:  [97.3 F (36.3 C)-97.9 F (36.6 C)] 97.3 F (36.3 C) (09/08 1914) Pulse Rate:  [55-74] 55 (09/08 0638) Resp:  [14-18] 14 (09/08 0638) BP: (99-122)/(58-82) 99/58 mmHg (09/08 0638) SpO2:  [95 %-98 %] 96 % (09/08 7829)  Intake/Output from previous day: 09/07 0701 - 09/08 0700 In: 6048.6 [P.O.:60; I.V.:480; TPN:5508.6] Out: 56213 [Urine:15100] Intake/Output this shift: Total I/O In: 0  Out: 600 [Urine:600] Nutritional status: Clear Liquid  Past Medical History  Diagnosis Date  . Hernia 03-24-13    ventral hernia remains   . GERD (gastroesophageal reflux disease)   . Pancreatitis 03-24-13    2002, 2'2013, 03-14-13  . Pancreatic cyst 2002 onset  . Anxiety   . Depression     "recent breakup with partner of 15 yrs"  . Adrenal insufficiency 04/23/2013    ??     Neurologic Exam:  Mental Status: Alert, oriented, thought content appropriate.  Speech fluent without evidence of aphasia.  Able to follow 3 step commands without difficulty. Cranial Nerves: II: Discs flat bilaterally; Visual fields grossly normal, pupils equal, round, reactive to light and accommodation III,IV, VI: ptosis not present, extra-ocular motions intact bilaterally V,VII: smile symmetric, facial light touch sensation normal bilaterally VIII: hearing normal bilaterally IX,X: gag reflex present XI: bilateral shoulder shrug XII: midline tongue extension Motor: Right : Upper extremity   5/5    Left:     Upper extremity   5/5  Lower extremity   5/5     Lower extremity   5/5 Tone  and bulk:normal tone throughout; no atrophy noted Sensory: Pinprick and light touch intact throughout, bilaterally Deep Tendon Reflexes:  3+ throughout  Plantars: Right: downgoing   Left: downgoing  Lab Results: No results found for this basename: cbc, bmp, coags, chol, tri, ldl, hga1c   Lipid Panel  Recent Labs  04/25/13 0400  TRIG 207*    Studies/Results: No results found.  MEDICATIONS                                                                                                                        Scheduled: . fentaNYL  25 mcg Transdermal Q72H  . lipase/protease/amylase  2 capsule Oral TID AC  . pantoprazole (PROTONIX) IV  40 mg Intravenous Daily  . promethazine  12.5 mg Intravenous Q6H    ASSESSMENT/PLAN:                                                                                                             37 yo F with diffuse white matter change of unclear etiology. Possibilities include Micronutrient deficiency. She has not had any neurological symptoms to suggest multiple sclerosis, though the MRI could be suggestive of this disease. Most likely chronic given the lack of acute symptos and unrelated to her current nausea/vomiting.  1) Lumbar puncture for oligoclonal bands, CSF protein and glucose, cell count and diff (pending) 2) I would favor follow up with repeat imaging in 3 - 6 months to assess for stability of these lesions.  3) If no other cause for adrenal insufficiency is found, it may be worthwhile to send very long chain fatty acids, though this is not common and there is rarely adrenal insufficiency in females.    Assessment and plan discussed with with attending physician and they are in agreement.    Felicie Morn PA-C [facility] [phone]  04/25/2013, 9:36 AM

## 2013-04-25 NOTE — Progress Notes (Signed)
[facility] PROGRESS NOTE  Mary [name] ZOX:[id number] DOB: 1976/03/10 DOA: 04/14/2013 PCP: Dorrene German, MD  HPI/Brief narrative 37 year old female with history of recurrent pancreatitis and pancreatic cyst of unknown etiology, post cholecystectomy, does not drink alcohol, underwent extensive evaluation by GI and surgery at [facility] view and underwent pancreatic stent endoscopically which was removed 36 hours later because it was infected, then underwent open surgery for? Small bowel to pancreas anastomosis to drain the cyst. Despite this she continued to have episodes of pancreatitis. Recently hospitalized July-August for pancreatitis and terminal ileitis with diarrhea. Discharge home on Cipro/Flagyl. Continued to have diarrhea. Underwent endoscopic ultrasound and biopsy of pancreatic cyst by Dr. Christella Hartigan on 8/28-16 mL of thick clear fluid removed-not sent for cultures. Admitted post procedure for worsening abdominal pain, elevated lipase. [facility] has consulted and requested surgical consult on 9/3.  Assessment/Plan:  Acute pancreatitis post EUS and FNA of pancreatic cyst, complicating chronic pancreatitis/pancreatic cyst/intractable nausea. - Patient continues to have nausea and abdominal pain. Continue symptomatic treatment-IV fluids, pain control and antiemetics. - Start by mouth pancreatic enzymes when able to tolerate- patient has been refusing. - Surgeons and GI consultation and followup appreciated. They recommend nonemergent outpatient evaluation at tertiary care center/Dr. Rise Mu at [facility] - No malignant cells on cytology. - Patient unable to tolerate oral intake without significant pain, nausea or vomiting. TPN started on 9/4. - Patient has persistent nausea and abdominal pain. Increase Fentanyl patch. Temporarily switched phenergan to IV- advised patient and family that she will not been DC'ed on IV pain/antiemetics. - n.p.o. except ice chips.  - GI ordered CT  head to rule out other causes for nausea and vomiting. CT shows extensive leukoencephalopathy not in keeping with her young age. Please see below. - Difficult to objectively assess severity of nausea or pain in this patient. Continues to complain on nausea, intermittent vomiting and constant abdominal pain. - Urine pregnancy test negative.  Low AM cortisol - Check cosyntropin test: reviewed with an Endocrinologist: not in keeping with adrenal insufficiency. - Check TSH, free T4 - both normal - Hemodynamically stable.  - No further work up  Diarrhea x2 months/terminal ileitis - Had terminal ileitis on CT in July which was suspected to be infectious and completed treatment with Cipro and Flagyl. Still having intermittent diarrhea. - Terminal ileitis resolved on repeat CT this admission. - Resolved. Now constipated  Anemia - No active bleeding. Stable. - Possible Iron deficiency. Start oral iron when able to tolerate.  Severe protein calorie malnutrition - Management per nutritionist.  - Unable to tolerate by mouth. Continue TPN.  Abnormal CT & MRI Head/Extensive leukoencephalopathy - Incidental finding. - Neurology consultation appreciated-indicates that her white matter changes on MRI brain could be secondary to nutrient deficiencies related to chronic GI issues and no neurological symptoms to suggest MS.  - neurology recommends outpatient followup with repeat imaging in 3-6 months to assess for stability of these lesions. - Underwent LP today- follow results.  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 Outpatient followup as deemed necessary.  DVT prophylaxis: Lovenox- patient has apparently refused since admission but MD not made aware by nursing. Ordered SCD's. Lines/catheters: PICC Nutrition: N.p.o. [name] Code Status: Full Family Communication: Discussed with extended family at beside- Mother, Step mother,  step dad, sister. Disposition Plan: Home possibly 9/9 on TPN and OP consultation at [facility].   Consultants:  [facility]  General surgery  Neurology  Procedures:  PICC  Antibiotics:  None   Subjective: Continued complaints of significant nausea, abdominal pain & intermittent vomiting. No diarrhea.  Objective: Filed Vitals:   04/24/13 0610 04/24/13 1509 04/24/13 2230 04/25/13 0638  BP: 111/66 107/72 122/82 99/58  Pulse: 67 74 67 55  Temp: 98.1 F (36.7 C) 97.9 F (36.6 C) 97.9 F (36.6 C) 97.3 F (36.3 C)  TempSrc: Oral Oral Oral Oral  Resp: 18 16 18 14   Height:      Weight:      SpO2: 94% 98% 95% 96%    Intake/Output Summary (Last 24 hours) at 04/25/13 1210 Last data filed at 04/25/13 1000  Gross per 24 hour  Intake 2521.5 ml  Output  40981 ml  Net -13178.5 ml   Filed Weights   04/14/13 2100  Weight: 111.3 kg (245 lb 6 oz)     Exam:  General exam: Moderately built and obese female patient sitting up in bed comfortably. Respiratory system: Clear. No increased work of breathing. Cardiovascular system: S1 & S2 heard, RRR. No JVD, murmurs, gallops, clicks or pedal edema. Gastrointestinal system: Abdomen is nondistended, soft and no obvious tenderness. Normal bowel sounds heard. Central nervous system: Alert and oriented. No focal neurological deficits. Extremities: Symmetric 5 x 5 power.   Data Reviewed: Basic Metabolic Panel:  Recent Labs Lab 04/19/13 0430 04/21/13 0505 04/22/13 0555 04/23/13 0415 04/25/13 0400  NA 138  --  138 138 137  K 4.0  --  3.5 3.5 3.7  CL 104  --  107 106 104  CO2 23  --  23 24 26   GLUCOSE 67*  --  115* 91 109*  BUN 3*  --  <3* 3* 11  CREATININE 0.60 0.54 0.54 0.59 0.52  CALCIUM 9.0  --  8.4 8.5 8.5  MG  --   --  1.7 1.8 2.0  PHOS  --   --  3.6 4.1 4.1   Liver Function Tests:  Recent Labs Lab 04/22/13 0555 04/25/13 0400  AST 22 13  ALT 20 9  ALKPHOS 68 63  BILITOT 0.2* 0.2*  PROT 6.5 6.5  ALBUMIN 2.7*  2.7*   No results found for this basename: LIPASE, AMYLASE,  in the last 168 hours No results found for this basename: AMMONIA,  in the last 168 hours CBC:  Recent Labs Lab 04/21/13 0505 04/22/13 0555 04/25/13 0400  WBC 6.6 6.7 8.8  NEUTROABS  --  3.4 4.7  HGB 10.7* 10.4* 10.4*  HCT 33.2* 32.3* 32.1*  MCV 87.8 87.8 87.0  PLT 264 276 276   Cardiac Enzymes: No results found for this basename: CKTOTAL, CKMB, CKMBINDEX, TROPONINI,  in the last 168 hours BNP (last 3 results) No results found for this basename: PROBNP,  in the last 8760 hours CBG:  Recent Labs Lab 04/24/13 0740 04/24/13 1206 04/24/13 1607 04/24/13 2216 04/25/13 0752  GLUCAP 132* 119* 119* 110* 133*    No results found for this or any previous visit (from the past 240 hour(s)).    Additional labs: 1. Diagnosis FINE NEEDLE ASPIRATION, ENDOSCOPIC, PANCREAS CYST FLUID: NO MALIGNANT CELLS IDENTIFIED. FINDINGS CONSISTENT WITH THE CONTENTS OF A CYST.       Studies: Dg Fluoro Guide Lumbar Puncture  04/25/2013   *RADIOLOGY REPORT*  Clinical Data:Abnormal MRI.  Possible multiple sclerosis.  LUMBAR PUNCTURE FLUORO GUIDE  Fluoroscopy Time: 1 minute, 31 seconds  Comparison: MRI and CT of the head on 04/22/2013  Findings: Following informed, written consent, the fluoroscopy  was used to identify the L3-4 site.  Using sterile technique, fluoroscopic guidance, and local anesthesia, a lumbar puncture was achieved using a 10 cm 22 gauge spinal needle.  Opening pressure was 10 cm of water.  A total of 17 ml of clear CSF was collected for analysis.  In the initial tube was noted to be pink-tinged. However, subsequent tubes showed clearing of the color.  IMPRESSION: Lumbar puncture at L3-4.  No apparent complications.   Original Report Authenticated By: Norva Pavlov, M.D.        Scheduled Meds: . fentaNYL  25 mcg Transdermal Q72H  . lipase/protease/amylase  2 capsule Oral TID AC  . pantoprazole (PROTONIX) IV  40 mg  Intravenous Daily  . promethazine  12.5 mg Intravenous Q6H   Continuous Infusions: . dextrose 5 % and 0.9 % NaCl with KCl 20 mEq/L 20 mL/hr at 04/24/13 0301  . Marland KitchenTPN (CLINIMIX-E) Adult 50 mL/hr at 04/25/13 1120   And  . fat emulsion 240 mL (04/24/13 1750)  . Marland KitchenTPN (CLINIMIX-E) Adult     And  . fat emulsion      Principal Problem:   Chronic pancreatitis with chronic pseudocysts Active Problems:   Obesity, morbid   Pancreatic cyst   history of recurrent ideopathic pancreatitis   Pancreatitis, acute   Protein-calorie malnutrition, severe   Anemia   Diarrhea   Persistent vomiting   ? Adrenal insufficiency   Abnormal MRI    Time spent: 15 minutes    Sarah Bush [facility]  [facility] Pager [phone].   If 8PM-8AM, please contact night-coverage at [email or web address], password [facility] 04/25/2013, 12:10 PM  LOS: 11 days

## 2013-04-25 NOTE — Procedures (Signed)
Under fluoro, L3-4 identified for LP. Using the 6 cm, and then the 10 cm 22 g LP needles, LP achieved. OP = 10 cm H2O. 17 cc clear CSF collected. Initial tube pink-tinged, with subsequent clearing. Tolerated well. No apparent complications.

## 2013-04-25 NOTE — Progress Notes (Signed)
Patient ID: Mary [name], female   DOB: 05/25/1976, 37 y.o.   MRN: [id number]    Subjective: Pain and nausea are the same as yesterday, maybe a little worse today, would like to get phenergan instead of zofran, diarrhea resolved now with constipation  Objective: Vital signs in last 24 hours: Temp:  [97.3 F (36.3 C)-97.9 F (36.6 C)] 97.3 F (36.3 C) (09/08 0638) Pulse Rate:  [55-74] 55 (09/08 0638) Resp:  [14-18] 14 (09/08 0638) BP: (99-122)/(58-82) 99/58 mmHg (09/08 0638) SpO2:  [95 %-98 %] 96 % (09/08 0638) Last BM Date: 04/23/13  Intake/Output from previous day: 09/07 0701 - 09/08 0700 In: 6048.6 [P.O.:60; I.V.:480; TPN:5508.6] Out: 40981 [Urine:15100] Intake/Output this shift:    PE: General- In NAD Abdomen-soft, minimal epigastric tenderness  Lab Results:   Recent Labs  04/25/13 0400  WBC 8.8  HGB 10.4*  HCT 32.1*  PLT 276   BMET  Recent Labs  04/23/13 0415 04/25/13 0400  NA 138 137  K 3.5 3.7  CL 106 104  CO2 24 26  GLUCOSE 91 109*  BUN 3* 11  CREATININE 0.59 0.52  CALCIUM 8.5 8.5   PT/INR No results found for this basename: LABPROT, INR,  in the last 72 hours Comprehensive Metabolic Panel:    Component Value Date/Time   NA 137 04/25/2013 0400   K 3.7 04/25/2013 0400   CL 104 04/25/2013 0400   CO2 26 04/25/2013 0400   BUN 11 04/25/2013 0400   CREATININE 0.52 04/25/2013 0400   GLUCOSE 109* 04/25/2013 0400   CALCIUM 8.5 04/25/2013 0400   AST 13 04/25/2013 0400   ALT 9 04/25/2013 0400   ALKPHOS 63 04/25/2013 0400   BILITOT 0.2* 04/25/2013 0400   PROT 6.5 04/25/2013 0400   ALBUMIN 2.7* 04/25/2013 0400     Studies/Results: No results found.  Anti-infectives: Anti-infectives   Start     Dose/Rate Route Frequency Ordered Stop   04/14/13 2200  ciprofloxacin (CIPRO) IVPB 400 mg     400 mg 200 mL/hr over 60 Minutes Intravenous Every 12 hours 04/14/13 2140 04/17/13 1121      Assessment Principal Problem:   Chronic pancreatitis with chronic pseudocysts and  acute exacerbation- no significant change Active Problems:   Obesity, morbid   Protein-calorie malnutrition, severe      LOS: 11 days   Plan:  TPN for nutrition. Will ask attending if ok to switch patient to IV phenergan instead of IV zofran to help with nausea.  Discharge when symptoms under control.   WHITE, ELIZABETH 04/25/2013

## 2013-04-25 NOTE — Progress Notes (Signed)
Reviewed situation and agree with plans per EW,PA. She and family were in discussions with Dr Waymon Amato when I came by and appears plans are to let her go tomorrow for f/u with Dr Marilynn Rail at [facility]

## 2013-04-25 NOTE — Progress Notes (Signed)
PARENTERAL NUTRITION CONSULT NOTE - FOLLOW UP  Pharmacy Consult for TNA Indication: chronic/complicated pancreatitis w/ pancreatic cyst, poor po intake  Allergies  Allergen Reactions  . Penicillins Anaphylaxis  . Latex Itching  . Morphine And Related Nausea And Vomiting    Patient Measurements: Height: 5\' 4"  (162.6 cm) Weight: 245 lb 6 oz (111.3 kg) IBW/kg (Calculated) : 54.7  Vital Signs: Temp: 97.3 F (36.3 C) (09/08 6213) Temp src: Oral (09/08 0638) BP: 99/58 mmHg (09/08 0865) Pulse Rate: 55 (09/08 0638) Intake/Output from previous day: 09/07 0701 - 09/08 0700 In: 6048.6 [P.O.:60; I.V.:480; TPN:5508.6] Out: 78469 [Urine:15100] Intake/Output from this shift:    Labs:  Recent Labs  04/25/13 0400  WBC 8.8  HGB 10.4*  HCT 32.1*  PLT 276     Recent Labs  04/23/13 0415 04/25/13 0400  NA 138 137  K 3.5 3.7  CL 106 104  CO2 24 26  GLUCOSE 91 109*  BUN 3* 11  CREATININE 0.59 0.52  CALCIUM 8.5 8.5  MG 1.8 2.0  PHOS 4.1 4.1  PROT  --  6.5  ALBUMIN  --  2.7*  AST  --  13  ALT  --  9  ALKPHOS  --  63  BILITOT  --  0.2*  TRIG  --  207*   Estimated Creatinine Clearance: 119.8 ml/min (by C-G formula based on Cr of 0.52).    Recent Labs  04/24/13 1206 04/24/13 1607 04/24/13 2216  GLUCAP 119* 119* 110*   CBGs & Insulin requirements past 24 hours:  - CBGs < 150, no SSI needed.  Nutritional Goals:  - RD recs 9/4: 1650-1850 kCal, 80-100 g/protein per day - Clinimix E 5/15, cyclic 1875ml/day and lipids at 79ml/hr over 18 hrs will provide 90g protein and 2106kCal per day.  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 Pt's cyst enlarged recently and pt underwent US and bx 04/14/13. Pt developed pain/N/V post-procedure. Surgery on board, recommended definitive treatment at a tertiary center. Pt with decreased PO intake -  TNA ordered to start 9/5.  Anticipate patient will go home on TNA until pt can be seen at tertiary center.    9/8: Today is TNA day# 5 and day# 1 cyclic over 18hrs. Pt tolerating TNA well. Discharge when symptoms under control per Surgery.   Current nutrition:  - Diet: Clear liquid since 9/3, not tolerating. - Cycle TNA (1800 ml total) over 18 hours @1800 -1900: 50 ml/hr @1900 -1100: 106 ml/hr @1100 -1200: 50 ml/hr - IVFE over 18 hours at 13 ml/hr - mIVF: D5NS + 17meq/L KCl at [facility]  Labs: Electrolytes: wnl Renal Function: Scr stable/wnl Hepatic Function: wnl Pre-Albumin: 9.7 on 9/5 Triglycerides: trended up slightly to 207 CBGs: controlled, no SSI needed   Plan:  Continue Clinimix E 5/15 infused over 18hrs as stated above.  Continue IV fat emulsion 20% at 22ml/hr over 18hrs daily.  Standard multivitamins and trace elements daily.  Continue IVF at [facility].  DC SSI. Change CBGs to 0800 and 2200 only to minimize sticks.  Bmet in am.  TNA labs Monday/Thursdays.  Pharmacy will follow up daily.  Charolotte Eke, PharmD, pager [phone]. 04/25/2013,7:38 AM.

## 2013-04-25 NOTE — Progress Notes (Addendum)
NUTRITION FOLLOW UP  Intervention:   - TPN per pharmacy - Diet advancement per MD - Anti-emetics per MD, pt requests sublingual phenergan and sublingual ativan and/or phenergan suppository if unable to go home on IV phenergan  - Pt not getting any very long chain fatty acids from IV fat emulsion in TPN, will need to find another source if neurologist wants to give it to pt  - Recommend multivitamin 1 tablet PO daily once nausea/vomiting resolves - recommend home regimen of 2 multivitamins per day with 1500mg  calcium citrate and 350-571mcg sublingual B12 r/t pt's history of roux en Y to prevent nutrient deficiencies in addition to 18-27 mg/day of elemental iron r/t pt with anemia  - Will continue to monitor   Nutrition Dx:   Inadequate oral intake related to clear liquid diet as evidenced by diet order - ongoing    New goal: TPN to meet >90% of estimated nutritional needs - met    Monitor:   Weights, labs, diet advancement, TPN, nausea/vomiting  Assessment:   Noted events in the past few days. Surgery obtained pt's records from [facility] - pt had a roux en Y cystojejunostomy. GI obtained head CT to check for non-GI causes of nausea/vomiting - results showed diffuse white brain matter changes and extensive leukoencephalopathy. Neurology was consulted and noted that micronutrient deficiency is a possibility for diffuse white matter change etiology and recommended possibility of starting pt on very long chain fatty acids if no other cause for adrenal insufficiency is found. Researched IV fat emulsions and found that it does not contain any very long chain fatty acids. Had lumbar puncture today. No new weights.   Met with pt who reports ongoing nausea and 2 episodes of vomiting today. C/o no BM since 9/6.   TPN: Clinimix E 5/15 cyclic for 18 hours/day providing 1840ml/day and lipids at 104ml/hr over 18 hrs will provide 90g protein and 2106kCal per day. Meets 128% minimum estimated energy needs and  112% minimum estimated protein needs.  - CBGs < 150 mg/dL - Triglycerides elevated and increased from last lab draw - PALB low    Height: Ht Readings from Last 1 Encounters:  04/14/13 5\' 4"  (1.626 m)    Weight Status:   Wt Readings from Last 1 Encounters:  04/14/13 245 lb 6 oz (111.3 kg)    Re-estimated needs:  Kcal: 1650-1850  Protein: 80-100g  Fluid: 1.6-1.8L/day  Skin: Intact  Diet Order: Clear Liquid   Intake/Output Summary (Last 24 hours) at 04/25/13 0955 Last data filed at 04/25/13 0900  Gross per 24 hour  Intake 6048.58 ml  Output  40981 ml  Net -9651.42 ml    Last BM: 9/6   Labs:   Recent Labs Lab 04/22/13 0555 04/23/13 0415 04/25/13 0400  NA 138 138 137  K 3.5 3.5 3.7  CL 107 106 104  CO2 23 24 26   BUN <3* 3* 11  CREATININE 0.54 0.59 0.52  CALCIUM 8.4 8.5 8.5  MG 1.7 1.8 2.0  PHOS 3.6 4.1 4.1  GLUCOSE 115* 91 109*    CBG (last 3)   Recent Labs  04/24/13 1607 04/24/13 2216 04/25/13 0752  GLUCAP 119* 110* 133*    Scheduled Meds: . fentaNYL  25 mcg Transdermal Q72H  . lipase/protease/amylase  2 capsule Oral TID AC  . pantoprazole (PROTONIX) IV  40 mg Intravenous Daily  . promethazine  12.5 mg Intravenous Q6H    Continuous Infusions: . dextrose 5 % and 0.9 % NaCl with KCl  20 mEq/L 20 mL/hr at 04/24/13 0301  . Marland KitchenTPN (CLINIMIX-E) Adult 106 mL/hr at 04/24/13 1845   And  . fat emulsion 240 mL (04/24/13 1750)  . Marland KitchenTPN (CLINIMIX-E) Adult     And  . fat emulsion      Levon Hedger MS, RD, LDN [phone] Pager [phone] After Hours Pager

## 2013-04-25 NOTE — Progress Notes (Signed)
[facility] Progress Note    Since last GI note:  Still very nauseas and with abd pain whenever she tries to eat or even drink water.  Ongoing neurologic workup for abnormal brain MRI.  Ongoing endocrine workup for possible adrenal insufficiency.   Objective: Vital signs in last 24 hours: Temp:  [97.3 F (36.3 C)-97.9 F (36.6 C)] 97.3 F (36.3 C) (09/08 1610) Pulse Rate:  [55-74] 55 (09/08 0638) Resp:  [14-18] 14 (09/08 9604) BP: (99-122)/(58-82) 99/58 mmHg (09/08 0638) SpO2:  [95 %-98 %] 96 % (09/08 5409) Last BM Date: 04/23/13 General: alert and oriented times 3 Heart: regular rate and rythm Abdomen: soft, non-tender, non-distended, normal bowel sounds   Lab Results:  Recent Labs  04/25/13 0400  WBC 8.8  HGB 10.4*  PLT 276  MCV 87.0    Recent Labs  04/23/13 0415 04/25/13 0400  NA 138 137  K 3.5 3.7  CL 106 104  CO2 24 26  GLUCOSE 91 109*  BUN 3* 11  CREATININE 0.59 0.52  CALCIUM 8.5 8.5    Recent Labs  04/25/13 0400  PROT 6.5  ALBUMIN 2.7*  AST 13  ALT 9  ALKPHOS 63  BILITOT 0.2*   Medications: Scheduled Meds: . fentaNYL  25 mcg Transdermal Q72H  . lipase/protease/amylase  2 capsule Oral TID AC  . pantoprazole (PROTONIX) IV  40 mg Intravenous Daily  . promethazine  25 mg Oral Q6H  . scopolamine  1 patch Transdermal Q72H   Continuous Infusions: . dextrose 5 % and 0.9 % NaCl with KCl 20 mEq/L 20 mL/hr at 04/24/13 0301  . Marland KitchenTPN (CLINIMIX-E) Adult 106 mL/hr at 04/24/13 1845   And  . fat emulsion 240 mL (04/24/13 1750)  . Marland KitchenTPN (CLINIMIX-E) Adult     And  . fat emulsion     PRN Meds:.acetaminophen, acetaminophen, bisacodyl, diphenhydrAMINE, HYDROmorphone (DILAUDID) injection, ondansetron (ZOFRAN) IV, ondansetron, polyvinyl alcohol, sodium chloride    Assessment/Plan: 37 y.o. female with mild acute pancreatitis (post EUS of chronic pancreatic cyst), ? Adrenal insuf, abnormal MRI of brain  Her continued n/v abd pain is out  of proportion to the CT imaging of her pancreas (a few days post EUS procedure), really mild pancreatitis on that exam at worst.  I agree with continuing TNA since she simply cannot eat.  Await results of neuro and adrenal workups.  If she truly has adrenal insuff, hopefully steroids will get her feeling better.   Rob Bunting, MD  04/25/2013, 8:20 AM [facility] Pager [phone]

## 2013-04-26 LAB — BASIC METABOLIC PANEL
CO2: 26 mEq/L (ref 19–32)
Glucose, Bld: 127 mg/dL — ABNORMAL HIGH (ref 70–99)
Potassium: 3.8 mEq/L (ref 3.5–5.1)
Sodium: 136 mEq/L (ref 135–145)

## 2013-04-26 LAB — GLUCOSE, CAPILLARY: Glucose-Capillary: 136 mg/dL — ABNORMAL HIGH (ref 70–99)

## 2013-04-26 MED ORDER — DIPHENHYDRAMINE HCL 50 MG/ML IJ SOLN
25.0000 mg | Freq: Four times a day (QID) | INTRAMUSCULAR | Status: DC | PRN
  Administered 2013-04-26 – 2013-04-28 (×5): 25 mg via INTRAVENOUS
  Filled 2013-04-26 (×5): qty 1

## 2013-04-26 MED ORDER — INFLUENZA VAC SPLIT QUAD 0.5 ML IM SUSP
0.5000 mL | INTRAMUSCULAR | Status: AC
  Administered 2013-04-27: 0.5 mL via INTRAMUSCULAR
  Filled 2013-04-26 (×2): qty 0.5

## 2013-04-26 MED ORDER — HYDROMORPHONE HCL PF 1 MG/ML IJ SOLN
1.0000 mg | INTRAMUSCULAR | Status: DC | PRN
  Administered 2013-04-26 – 2013-04-27 (×4): 1 mg via INTRAVENOUS
  Filled 2013-04-26 (×4): qty 1

## 2013-04-26 MED ORDER — TRACE MINERALS CR-CU-F-FE-I-MN-MO-SE-ZN IV SOLN
INTRAVENOUS | Status: DC
  Administered 2013-04-26: 18:00:00 via INTRAVENOUS
  Filled 2013-04-26: qty 2000

## 2013-04-26 MED ORDER — LORAZEPAM 2 MG/ML IJ SOLN
0.5000 mg | Freq: Three times a day (TID) | INTRAMUSCULAR | Status: DC
  Administered 2013-04-26 – 2013-04-28 (×5): 0.5 mg via INTRAVENOUS
  Filled 2013-04-26 (×5): qty 1

## 2013-04-26 MED ORDER — PROMETHAZINE HCL 25 MG RE SUPP
25.0000 mg | Freq: Four times a day (QID) | RECTAL | Status: DC
  Administered 2013-04-26 – 2013-04-27 (×4): 25 mg via RECTAL
  Filled 2013-04-26 (×8): qty 1

## 2013-04-26 MED ORDER — FAT EMULSION 20 % IV EMUL
250.0000 mL | INTRAVENOUS | Status: AC
  Administered 2013-04-26: 250 mL via INTRAVENOUS
  Filled 2013-04-26: qty 250

## 2013-04-26 NOTE — Progress Notes (Signed)
Patient ID: Mary [name], female   DOB: [birth year], 37 y.o.   MRN: [id number]    Subjective: Severe nausea this am per patient, unable to tolerate PO pills  Objective: Vital signs in last 24 hours: Temp:  [97.6 F (36.4 C)-98.3 F (36.8 C)] 97.6 F (36.4 C) (09/09 0610) Pulse Rate:  [58-68] 68 (09/09 0610) Resp:  [16-20] 16 (09/09 0610) BP: (93-106)/(62-65) 93/62 mmHg (09/09 0610) SpO2:  [95 %-97 %] 96 % (09/09 0610) Last BM Date: 04/23/13  Intake/Output from previous day: 09/08 0701 - 09/09 0700 In: 2290.7 [I.V.:480; TPN:1810.7] Out: 2450 [Urine:2450] Intake/Output this shift:    PE: General- In NAD Abdomen-soft, minimal epigastric tenderness  Lab Results:   Recent Labs  04/25/13 0400  WBC 8.8  HGB 10.4*  HCT 32.1*  PLT 276   BMET  Recent Labs  04/25/13 0400 04/26/13 0455  NA 137 136  K 3.7 3.8  CL 104 104  CO2 26 26  GLUCOSE 109* 127*  BUN 11 12  CREATININE 0.52 0.53  CALCIUM 8.5 8.6   PT/INR No results found for this basename: LABPROT, INR,  in the last 72 hours Comprehensive Metabolic Panel:    Component Value Date/Time   NA 136 04/26/2013 0455   K 3.8 04/26/2013 0455   CL 104 04/26/2013 0455   CO2 26 04/26/2013 0455   BUN 12 04/26/2013 0455   CREATININE 0.53 04/26/2013 0455   GLUCOSE 127* 04/26/2013 0455   CALCIUM 8.6 04/26/2013 0455   AST 13 04/25/2013 0400   ALT 9 04/25/2013 0400   ALKPHOS 63 04/25/2013 0400   BILITOT 0.2* 04/25/2013 0400   PROT 6.5 04/25/2013 0400   ALBUMIN 2.7* 04/25/2013 0400     Studies/Results: Dg Fluoro Guide Lumbar Puncture  04/25/2013   *RADIOLOGY REPORT*  Clinical Data:Abnormal MRI.  Possible multiple sclerosis.  LUMBAR PUNCTURE FLUORO GUIDE  Fluoroscopy Time: 1 minute, 31 seconds  Comparison: MRI and CT of the head on 04/22/2013  Findings: Following informed, written consent, the fluoroscopy was used to identify the L3-4 site.  Using sterile technique, fluoroscopic guidance, and local anesthesia, a lumbar puncture was achieved using a  10 cm 22 gauge spinal needle.  Opening pressure was 10 cm of water.  A total of 17 ml of clear CSF was collected for analysis.  In the initial tube was noted to be pink-tinged. However, subsequent tubes showed clearing of the color.  IMPRESSION: Lumbar puncture at L3-4.  No apparent complications.   Original Report Authenticated By: Norva Pavlov, M.D.    Anti-infectives: Anti-infectives   Start     Dose/Rate Route Frequency Ordered Stop   04/14/13 2200  ciprofloxacin (CIPRO) IVPB 400 mg     400 mg 200 mL/hr over 60 Minutes Intravenous Every 12 hours 04/14/13 2140 04/17/13 1121      Assessment Principal Problem:   Chronic pancreatitis with chronic pseudocysts and acute exacerbation- no significant change Active Problems:   Obesity, morbid   Protein-calorie malnutrition, severe      LOS: 12 days   Plan:  TPN for nutrition. Discussed with patient about phenergan, not really an option to do IV at home but discussed PR, she will try this, have ordered in q6hrs scheduled.  Discharge when symptoms under control.   Mary [name] 04/26/2013

## 2013-04-26 NOTE — Progress Notes (Signed)
[facility]  Patient Status:   Pt is new pt this admission for [facility]  [facility] is providing the following services:  Pt will have HH RN, home infusion pharmacy services for home TNA.  [facility] Infusion Coordinator will provide in hospital TNA training to support independence at home.  [facility] is prepared to transition pt home when deemed appropriate by MD team.  If patient discharges after hours, please call [phone].   Sedalia Muta 04/26/2013, 4:08 PM

## 2013-04-26 NOTE — Progress Notes (Signed)
Patient ID: Mary [name], female   DOB: 03/15/1976, 37 y.o.   MRN: [id number] [facility] Progress Note  Subjective: Still very nauseated-constant- only taking ice chips,says everything else will come back up. LP yesterday Started onTPN, and now on fentanyl patch as well Asked he about anxiety etc- says she just got out of a 15 year relationship,admits anxiety, dysphoria but not sure this is causing her anxiety   Objective:  Vital signs in last 24 hours: Temp:  [97.6 F (36.4 C)-98.3 F (36.8 C)] 97.6 F (36.4 C) (09/09 0610) Pulse Rate:  [58-68] 68 (09/09 0610) Resp:  [16-20] 16 (09/09 0610) BP: (93-106)/(62-65) 93/62 mmHg (09/09 0610) SpO2:  [95 %-97 %] 96 % (09/09 0610) Last BM Date: 04/23/13 General:   Alert,  Well-developed, WF   in NAD Heart:  Regular rate and rhythm; no murmurs Pulm;clear Abdomen:  Soft, tender upper abdomen and nondistended. Normal bowel sounds, without guarding, and without rebound.   Extremities:  Without edema. Neurologic:  Alert and  oriented x4;  grossly normal neurologically. Psych:  Alert and cooperative.,flat affect.   Lab Results:  Recent Labs  04/25/13 0400  WBC 8.8  HGB 10.4*  HCT 32.1*  PLT 276   BMET  Recent Labs  04/25/13 0400 04/26/13 0455  NA 137 136  K 3.7 3.8  CL 104 104  CO2 26 26  GLUCOSE 109* 127*  BUN 11 12  CREATININE 0.52 0.53  CALCIUM 8.5 8.6   LFT  Recent Labs  04/25/13 0400  PROT 6.5  ALBUMIN 2.7*  AST 13  ALT 9  ALKPHOS 63  BILITOT 0.2*     Assessment / Plan: #1 37 yo female with recurrent pancreatitis with pseudocysts- mild pancreatitis on recent imaging and no evidence for chronic pancreatitis by EUS- now with severe nausea Not sure that pancreas is driving the nausea- workup for adrenal insuff-  Negative MRI and LP done and ? MS-workup in progress Started on Phenergan supp this am, scheduled Will add low dose ativan- IV initially- see if helps #2 malnutrition- secondary to  above - on TPN #3 morbid obesity Principal Problem:   Chronic pancreatitis with chronic pseudocysts Active Problems:   Obesity, morbid   Pancreatic cyst   history of recurrent ideopathic pancreatitis   Pancreatitis, acute   Protein-calorie malnutrition, severe   Anemia   Diarrhea   Persistent vomiting   ? Adrenal insufficiency- Work up negative   Abnormal MRI   Low serum cortisol level     LOS: 12 days   Amy Esterwood  04/26/2013, 8:52 AM   ________________________________________________________________________  [facility] MD note:  I personally examined the patient, reviewed the data and agree with the assessment and plan described above.  I cannot explain shy she is still having such trouble eating, this is definitely out of proportion to her radiographically very mild pancreatitis.  I think functional issues (psychosocial) may be playing a signficant role.  Agree with scheduled phenergan, low dose ativan.  She should be encouraged to eat, anything that appeals to her.   Rob Bunting, MD [facility] Pager [phone]

## 2013-04-26 NOTE — Progress Notes (Signed)
PARENTERAL NUTRITION CONSULT NOTE - FOLLOW UP  Pharmacy Consult for TNA Indication: chronic/complicated pancreatitis w/ pancreatic cyst, poor po intake  Allergies  Allergen Reactions  . Penicillins Anaphylaxis  . Latex Itching  . Morphine And Related Nausea And Vomiting    Patient Measurements: Height: 5\' 4"  (162.6 cm) Weight: 245 lb 6 oz (111.3 kg) IBW/kg (Calculated) : 54.7  Vital Signs: Temp: 97.6 F (36.4 C) (09/09 0610) Temp src: Oral (09/09 0610) BP: 93/62 mmHg (09/09 0610) Pulse Rate: 68 (09/09 0610) Intake/Output from previous day: 09/08 0701 - 09/09 0700 In: 2290.7 [I.V.:480; TPN:1810.7] Out: 2450 [Urine:2450] Intake/Output from this shift:    Labs:  Recent Labs  04/25/13 0400  WBC 8.8  HGB 10.4*  HCT 32.1*  PLT 276     Recent Labs  04/25/13 0400 04/26/13 0455  NA 137 136  K 3.7 3.8  CL 104 104  CO2 26 26  GLUCOSE 109* 127*  BUN 11 12  CREATININE 0.52 0.53  CALCIUM 8.5 8.6  MG 2.0  --   PHOS 4.1  --   PROT 6.5  --   ALBUMIN 2.7*  --   AST 13  --   ALT 9  --   ALKPHOS 63  --   BILITOT 0.2*  --   PREALBUMIN 9.2*  --   TRIG 207*  --    Estimated Creatinine Clearance: 119.8 ml/min (by C-G formula based on Cr of 0.53).    Recent Labs  04/24/13 2216 04/25/13 0752 04/25/13 2144  GLUCAP 110* 133* 97   CBGs & Insulin requirements past 24 hours:  - CBGs ranging 97-136 on and off TNA.  Nutritional Goals:  - RD recs 9/4: 1650-1850 kCal, 80-100 g/protein per day - Clinimix E 5/15, cyclic 1841ml/day and lipids at 44ml/hr over 18 hrs will provide 90g protein and 2106kCal per day.  Assessment:  9 yof with h/o recurrent pancreatitis, pancreatitic cystic mass s/p stent in 2007, s/p removal given infection and Roux en Y cystojejunostomy at [facility] in October of 2008. Pt's cyst enlarged recently and pt underwent US and bx 04/14/13. Pt developed pain/N/V post-procedure. Surgery on board, recommended definitive treatment at a tertiary center. Pt with  decreased PO intake - TNA started 9/5.  Anticipate patient will go home on TNA until pt can be seen at tertiary center.    9/9: Day# 6 and day# 2 cyclic over 18hrs. Pt tolerating TNA well. I/O negative. UOP good. BP remains low. Discharge when symptoms under control per Surgery.   Current nutrition:  - Diet: Clear liquid since 9/3, not able to tolerate. - Cycle TNA (1800 ml total) over 18 hours @1800 -1900: 50 ml/hr @1900 -1100: 106 ml/hr @1100 -1200: 50 ml/hr - IVFE over 18 hours at 13 ml/hr - mIVF: D5NS + 43meq/L KCl at [facility]  Labs: Electrolytes: wnl. Corr Ca 9.6. Renal Function: Scr stable/wnl Hepatic Function: wnl Pre-Albumin: 9.2, not rising yet Triglycerides: trended up to 207(9/8) CBGs: controlled w/ no evidence of rebound hypoglycemia. Off SSI for now.   Plan:  Continue Clinimix E 5/15 infused over 18hrs as stated above.  Continue IV fat emulsion 20% at 26ml/hr over 18hrs daily.  Will consider shortening duration of cyclic TNA tomorrow.  Standard multivitamins and trace elements in TNA daily.  Continue IVF at [facility].  Cont CBGs at 0800 and 2200 only to minimize sticks.  TNA labs Monday/Thursdays.  Pharmacy will follow up daily.  Charolotte Eke, PharmD, pager [phone]. 04/26/2013,7:34 AM.

## 2013-04-26 NOTE — Progress Notes (Signed)
NEURO HOSPITALIST PROGRESS NOTE   SUBJECTIVE:                                                                                                                         No complaints.   OBJECTIVE:                                                                                                                           Vital signs in last 24 hours: Temp:  [97.6 F (36.4 C)-98.3 F (36.8 C)] 97.6 F (36.4 C) (09/09 0610) Pulse Rate:  [58-68] 68 (09/09 0610) Resp:  [16-20] 16 (09/09 0610) BP: (93-106)/(62-65) 93/62 mmHg (09/09 0610) SpO2:  [95 %-97 %] 96 % (09/09 0610)  Intake/Output from previous day: 09/08 0701 - 09/09 0700 In: 2290.7 [I.V.:480; TPN:1810.7] Out: 2450 [Urine:2450] Intake/Output this shift:   Nutritional status: Clear Liquid  Past Medical History  Diagnosis Date  . Hernia 03-24-13    ventral hernia remains   . GERD (gastroesophageal reflux disease)   . Pancreatitis 03-24-13    2002, 2'2013, 03-14-13  . Pancreatic cyst 2002 onset  . Anxiety   . Depression     "recent breakup with partner of 15 yrs"  . Adrenal insufficiency 04/23/2013    ??    Neurologic Exam:  Mental Status:  Alert, oriented, thought content appropriate. Speech fluent without evidence of aphasia. Able to follow 3 step commands without difficulty.  Cranial Nerves:  II: Discs flat bilaterally; Visual fields grossly normal, pupils equal, round, reactive to light and accommodation  III,IV, VI: ptosis not present, extra-ocular motions intact bilaterally  V,VII: smile symmetric, facial light touch sensation normal bilaterally  VIII: hearing normal bilaterally  IX,X: gag reflex present  XI: bilateral shoulder shrug  XII: midline tongue extension  Motor:  5/5 throughout Tone and bulk:normal tone throughout; no atrophy noted  Sensory: Pinprick and light touch intact throughout, bilaterally  Deep Tendon Reflexes:  3+ throughout  Plantars:  Right: downgoing  Left: downgoing   Lab Results: No results found for this basename: cbc, bmp, coags, chol, tri, ldl, hga1c   Lipid Panel  Recent Labs  04/25/13 0400  TRIG 207*    Studies/Results: Dg Fluoro Guide Lumbar Puncture  04/25/2013   *  RADIOLOGY REPORT*  Clinical Data:Abnormal MRI.  Possible multiple sclerosis.  LUMBAR PUNCTURE FLUORO GUIDE  Fluoroscopy Time: 1 minute, 31 seconds  Comparison: MRI and CT of the head on 04/22/2013  Findings: Following informed, written consent, the fluoroscopy was used to identify the L3-4 site.  Using sterile technique, fluoroscopic guidance, and local anesthesia, a lumbar puncture was achieved using a 10 cm 22 gauge spinal needle.  Opening pressure was 10 cm of water.  A total of 17 ml of clear CSF was collected for analysis.  In the initial tube was noted to be pink-tinged. However, subsequent tubes showed clearing of the color.  IMPRESSION: Lumbar puncture at L3-4.  No apparent complications.   Original Report Authenticated By: Norva Pavlov, M.D.    MEDICATIONS                                                                                                                        Scheduled: . fentaNYL  50 mcg Transdermal Q72H  . lipase/protease/amylase  2 capsule Oral TID AC  . LORazepam  0.5 mg Intravenous Q8H  . pantoprazole (PROTONIX) IV  40 mg Intravenous Daily  . promethazine  25 mg Rectal Q6H    ASSESSMENT/PLAN:                                                                                                            37 yo F with diffuse white matter change of unclear etiology. Possibilities include Micronutrient deficiency. She has not had any neurological symptoms to suggest multiple sclerosis, though the MRI could be suggestive of this disease. Most likely chronic given the lack of acute symptos and unrelated to her current nausea/vomiting.    1) Lumbar puncture for oligoclonal bands (pending) --will need follow up with [facility] as out patient. Patient  dose have a balance at [facility] which will need to be paid first --I have spoken to patient and she is going to call [facility] to figure out a payment plan.  2) I would favor follow up with repeat imaging in 3 - 6 months to assess for stability of these lesions.  3) If no other cause for adrenal insufficiency is found, it may be worthwhile to send very long chain fatty acids, though this is not common and there is rarely adrenal insufficiency in females.   Neurology will S/O   Assessment and plan discussed with with attending physician and they are in agreement.    Felicie Morn PA-C [facility] [phone]  04/26/2013, 9:45 AM

## 2013-04-26 NOTE — Progress Notes (Signed)
Agree with A&P of EW,PA. No additional suggestions. Does not appear to have a surgical issue I can fix. Will need f/u with Dr Marilynn Rail at [facility] \

## 2013-04-26 NOTE — Progress Notes (Addendum)
[facility] PROGRESS NOTE  Mary [name] RUE:[id number] DOB: 05-Dec-1975 DOA: 04/14/2013 PCP: Dorrene German, MD  HPI/Brief narrative 37 year old female with history of recurrent pancreatitis and pancreatic cyst of unknown etiology, post cholecystectomy, does not drink alcohol, underwent extensive evaluation by GI and surgery at [facility] and underwent pancreatic stent endoscopically which was removed 36 hours later because it was infected, then underwent open surgery for? Small bowel to pancreas anastomosis to drain the cyst. Despite this she continued to have episodes of pancreatitis. Recently hospitalized July-August for pancreatitis and terminal ileitis with diarrhea. Discharged home on Cipro/Flagyl. Continued to have diarrhea. Underwent endoscopic ultrasound and biopsy of pancreatic cyst by Dr. Christella Hartigan on 8/28-16 mL of thick clear fluid removed-not sent for cultures. Cytology negative. Admitted post procedure for worsening abdominal pain, elevated lipase/Acute pancreatitis. GI and surgery consulted and continued to assist in management. Surgery discussed her care with Dr. Rise Mu at [facility] (all records forwarded to him) and patient will eventually need to followup with him outpatient for possible surgery. Neurology consulted for abnormal MRI brain. Patient continues to have intractable nausea, abdominal pain and intermittent vomiting-made n.p.o. and started on TNA. Home when symptomatically better.  Assessment/Plan:  Acute pancreatitis post EUS and FNA of pancreatic cyst, complicating chronic pancreatitis/pancreatic cyst/intractable nausea. - Patient has had a difficult hospital course. She has not been able to tolerate any oral intake consistently. - GI and surgery are consulting. They recommend followup with surgeons/Dr. Rise Mu at [facility] in an ambulatory setting for possible surgery. Patient will go home when symptomatically better. - The etiology of her intractable  nausea and intermittent vomiting or not entirely clear. She has not done well with multiple different antiemetics and eventually has been placed on rectal Phenergan today. Fentanyl patch was started and dose increased to 50 mcg on 04/25/13. - CT head and a.m. cortisol was requested by GI to see if there were any other causes for her nausea and vomiting. Please see discussion below for details. - It is felt that her nausea is out of proportion to her radiographically very mild pancreatitis. There may be underlying functional/psychosocial issues contributing. Continue rectal scheduled Phenergan and low-dose Ativan added on 9/9. ? Also secondary to Dilaudid- reduced dose and frequency. - Discussed with Dr. Marilynn Rail on 04/26/13 who indicates in receipt of all patients documents and reiterates that she will need to see him in an ambulatory setting. - Start oral pancreatic enzymes when able to tolerate-patient has been refusing - Urine pregnancy test negative. - It is difficult to objectively assess patient's symptomatology   Low AM cortisol - This was done to evaluate other possible causes for patient's nausea and vomiting. - Cosyntropin test: reviewed with an Endocrinologist on 9/8: not in keeping with adrenal insufficiency. - TSH, free T4 - both normal - No further work up  Diarrhea x2 months/terminal ileitis - Had terminal ileitis on CT in July which was suspected to be infectious and completed treatment with Cipro and Flagyl.  - Terminal ileitis resolved on repeat CT this admission. - Resolved. Now constipated  Anemia - No active bleeding. Stable. - Possible Iron deficiency. Start oral iron when able to tolerate.  Severe protein calorie malnutrition - Management per nutritionist.  - Unable to tolerate by mouth. Continue TPN.  Abnormal CT & MRI Head/Extensive leukoencephalopathy - Incidental finding. Unclear etiology. - Neurology consultation appreciated-indicates that her white matter changes  on MRI brain could be secondary to nutrient deficiencies related to chronic GI issues. She has  not had any neurological symptoms to suggest multiple sclerosis, though the MRI could be suggestive of this disease. - Underwent LP on 9/8: Oligoclonal bands (pending)-will need followup with [facility] as outpatient and patient has indicated to the neurology team that she will followup. - Neurology also recommends repeat imaging and 3-6 months to assess for stability of these lesions.   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 Outpatient followup as deemed necessary.  DVT prophylaxis:  SCD's. Lines/catheters: PICC Nutrition: N.p.o. [name] Code Status: Full Family Communication: None. Disposition Plan: Home when symptom controlled and OP consultation at [facility].   Consultants:  [facility]  General surgery  Neurology  Procedures:  PICC  Lumbar puncture 04/25/13  Antibiotics:  None   Subjective: Claims worsened nausea since 3 AM but no vomiting and rates abdominal pain is 8/10.  Objective: Filed Vitals:   04/25/13 0638 04/25/13 1353 04/25/13 2204 04/26/13 0610  BP: 99/58 106/62 98/65 93/62   Pulse: 55 58 59 68  Temp: 97.3 F (36.3 C) 98.3 F (36.8 C) 98.1 F (36.7 C) 97.6 F (36.4 C)  TempSrc: Oral Oral Oral Oral  Resp: 14 20 16 16   Height:      Weight:      SpO2: 96% 97% 95% 96%    Intake/Output Summary (Last 24 hours) at 04/26/13 1347 Last data filed at 04/26/13 1011  Gross per 24 hour  Intake   1656 ml  Output   2150 ml  Net   -494 ml   Filed Weights   04/14/13 2100  Weight: 111.3 kg (245 lb 6 oz)     Exam:  General exam: Moderately built and obese female patient who looks uncomfortable today. Respiratory system: Clear. No increased work of breathing. Cardiovascular system: S1 & S2 heard, RRR. No JVD, murmurs, gallops, clicks or pedal edema. Gastrointestinal system: Abdomen is nondistended,  soft and no obvious tenderness. Normal bowel sounds heard. Central nervous system: Alert and oriented. No focal neurological deficits. Extremities: Symmetric 5 x 5 power.   Data Reviewed: Basic Metabolic Panel:  Recent Labs Lab 04/21/13 0505 04/22/13 0555 04/23/13 0415 04/25/13 0400 04/26/13 0455  NA  --  138 138 137 136  K  --  3.5 3.5 3.7 3.8  CL  --  107 106 104 104  CO2  --  23 24 26 26   GLUCOSE  --  115* 91 109* 127*  BUN  --  <3* 3* 11 12  CREATININE 0.54 0.54 0.59 0.52 0.53  CALCIUM  --  8.4 8.5 8.5 8.6  MG  --  1.7 1.8 2.0  --   PHOS  --  3.6 4.1 4.1  --    Liver Function Tests:  Recent Labs Lab 04/22/13 0555 04/25/13 0400  AST 22 13  ALT 20 9  ALKPHOS 68 63  BILITOT 0.2* 0.2*  PROT 6.5 6.5  ALBUMIN 2.7* 2.7*   No results found for this basename: LIPASE, AMYLASE,  in the last 168 hours No results found for this basename: AMMONIA,  in the last 168 hours CBC:  Recent Labs Lab 04/21/13 0505 04/22/13 0555 04/25/13 0400  WBC 6.6 6.7 8.8  NEUTROABS  --  3.4 4.7  HGB 10.7* 10.4* 10.4*  HCT 33.2* 32.3* 32.1*  MCV 87.8 87.8 87.0  PLT 264 276 276   Cardiac Enzymes: No results found for this basename: CKTOTAL, CKMB, CKMBINDEX, TROPONINI,  in the last 168 hours BNP (last 3 results)  No results found for this basename: PROBNP,  in the last 8760 hours CBG:  Recent Labs Lab 04/24/13 1607 04/24/13 2216 04/25/13 0752 04/25/13 2144 04/26/13 0732  GLUCAP 119* 110* 133* 97 136*    Recent Results (from the past 240 hour(s))  GRAM STAIN     Status: None   Collection Time    04/25/13 11:20 AM      Result Value Range Status   Specimen Description CSF   Final   Special Requests NONE   Final   Gram Stain     Final   Value: NO ORGANISMS SEEN     NO WBC SEEN     Gram Stain Report Called to,Read Back By and Verified With: BLOCK,D. AT 1321 ON 09.08.14 BY LOVE,T.   Report Status 04/25/2013 FINAL   Final      Additional labs: 1. Diagnosis FINE NEEDLE  ASPIRATION, ENDOSCOPIC, PANCREAS CYST FLUID: NO MALIGNANT CELLS IDENTIFIED. FINDINGS CONSISTENT WITH THE CONTENTS OF A CYST.       Studies: Dg Fluoro Guide Lumbar Puncture  04/25/2013   *RADIOLOGY REPORT*  Clinical Data:Abnormal MRI.  Possible multiple sclerosis.  LUMBAR PUNCTURE FLUORO GUIDE  Fluoroscopy Time: 1 minute, 31 seconds  Comparison: MRI and CT of the head on 04/22/2013  Findings: Following informed, written consent, the fluoroscopy was used to identify the L3-4 site.  Using sterile technique, fluoroscopic guidance, and local anesthesia, a lumbar puncture was achieved using a 10 cm 22 gauge spinal needle.  Opening pressure was 10 cm of water.  A total of 17 ml of clear CSF was collected for analysis.  In the initial tube was noted to be pink-tinged. However, subsequent tubes showed clearing of the color.  IMPRESSION: Lumbar puncture at L3-4.  No apparent complications.   Original Report Authenticated By: Norva Pavlov, M.D.        Scheduled Meds: . fentaNYL  50 mcg Transdermal Q72H  . lipase/protease/amylase  2 capsule Oral TID AC  . LORazepam  0.5 mg Intravenous Q8H  . pantoprazole (PROTONIX) IV  40 mg Intravenous Daily  . promethazine  25 mg Rectal Q6H   Continuous Infusions: . dextrose 5 % and 0.9 % NaCl with KCl 20 mEq/L 20 mL/hr at 04/25/13 1652  . Marland KitchenTPN (CLINIMIX-E) Adult     And  . fat emulsion    . Marland KitchenTPN (CLINIMIX-E) Adult 50 mL/hr at 04/25/13 1821    Principal Problem:   Chronic pancreatitis with chronic pseudocysts Active Problems:   Obesity, morbid   Pancreatic cyst   history of recurrent ideopathic pancreatitis   Pancreatitis, acute   Protein-calorie malnutrition, severe   Anemia   Diarrhea   Persistent vomiting   ? Adrenal insufficiency- Work up negative   Abnormal MRI   Low serum cortisol level    Time spent: 25 minutes    [facility]  [facility] Pager [phone].   If 8PM-8AM, please contact night-coverage at [email or web address],  password [facility] 04/26/2013, 1:47 PM  LOS: 12 days

## 2013-04-27 LAB — CSF IGG: IgG, CSF: 3.3 mg/dL (ref 0.8–7.7)

## 2013-04-27 MED ORDER — HYDROMORPHONE HCL PF 1 MG/ML IJ SOLN
0.5000 mg | Freq: Four times a day (QID) | INTRAMUSCULAR | Status: DC | PRN
  Administered 2013-04-27: 0.5 mg via INTRAVENOUS
  Filled 2013-04-27: qty 1

## 2013-04-27 MED ORDER — ONDANSETRON HCL 4 MG/2ML IJ SOLN
4.0000 mg | Freq: Two times a day (BID) | INTRAMUSCULAR | Status: DC
  Administered 2013-04-27 – 2013-04-28 (×3): 4 mg via INTRAVENOUS
  Filled 2013-04-27 (×3): qty 2

## 2013-04-27 MED ORDER — FAT EMULSION 20 % IV EMUL
250.0000 mL | INTRAVENOUS | Status: DC
  Filled 2013-04-27 (×2): qty 250

## 2013-04-27 MED ORDER — CLINIMIX E/DEXTROSE (5/15) 5 % IV SOLN
INTRAVENOUS | Status: DC
  Filled 2013-04-27: qty 2000

## 2013-04-27 MED ORDER — PANTOPRAZOLE SODIUM 40 MG PO TBEC
40.0000 mg | DELAYED_RELEASE_TABLET | Freq: Every day | ORAL | Status: DC
  Filled 2013-04-27: qty 1

## 2013-04-27 MED ORDER — HYDROMORPHONE HCL 2 MG PO TABS
2.0000 mg | ORAL_TABLET | Freq: Four times a day (QID) | ORAL | Status: DC | PRN
  Administered 2013-04-27: 2 mg via ORAL
  Filled 2013-04-27: qty 1

## 2013-04-27 MED ORDER — METOCLOPRAMIDE HCL 5 MG PO TABS
5.0000 mg | ORAL_TABLET | Freq: Three times a day (TID) | ORAL | Status: DC
  Administered 2013-04-27 – 2013-04-28 (×3): 5 mg via ORAL
  Filled 2013-04-27 (×7): qty 1

## 2013-04-27 NOTE — Progress Notes (Signed)
[facility] Progress Note    Since last GI note: Still with nausea, vomiting. TAking dilauded every 4 hours.      Objective: Vital signs in last 24 hours: Temp:  [97.9 F (36.6 C)-98.3 F (36.8 C)] 97.9 F (36.6 C) (09/10 0625) Pulse Rate:  [64-102] 64 (09/10 0625) Resp:  [16-20] 16 (09/10 0625) BP: (101-111)/(61-65) 111/65 mmHg (09/10 0625) SpO2:  [94 %-96 %] 94 % (09/10 0625) Last BM Date: 04/23/13 General: alert and oriented times 3 Heart: regular rate and rythm Abdomen: soft, non-tender, non-distended, normal bowel sounds   Lab Results:  Recent Labs  04/25/13 0400  WBC 8.8  HGB 10.4*  PLT 276  MCV 87.0    Recent Labs  04/25/13 0400 04/26/13 0455  NA 137 136  K 3.7 3.8  CL 104 104  CO2 26 26  GLUCOSE 109* 127*  BUN 11 12  CREATININE 0.52 0.53  CALCIUM 8.5 8.6    Recent Labs  04/25/13 0400  PROT 6.5  ALBUMIN 2.7*  AST 13  ALT 9  ALKPHOS 63  BILITOT 0.2*   Medications: Scheduled Meds: . fentaNYL  50 mcg Transdermal Q72H  . influenza vac split quadrivalent PF  0.5 mL Intramuscular Tomorrow-1000  . lipase/protease/amylase  2 capsule Oral TID AC  . LORazepam  0.5 mg Intravenous Q8H  . pantoprazole (PROTONIX) IV  40 mg Intravenous Daily  . promethazine  25 mg Rectal Q6H   Continuous Infusions: . dextrose 5 % and 0.9 % NaCl with KCl 20 mEq/L 20 mL/hr at 04/25/13 1652  . Marland KitchenTPN (CLINIMIX-E) Adult 50 mL/hr at 04/26/13 1742   And  . fat emulsion 250 mL (04/26/13 1743)   PRN Meds:.acetaminophen, acetaminophen, bisacodyl, diphenhydrAMINE, HYDROmorphone (DILAUDID) injection, polyvinyl alcohol, promethazine, sodium chloride    Assessment/Plan: 37 y.o. female with prolonged nauseae, vomiting  She doesn't seem to be in much pain clinically.  Perhaps her dilaudid IV and fent patch are creating some of her nausea.  I am going to stop both of those meds to see if her nausea improves.  We should really strive to get her off the  TNA.    Rob Bunting, MD  04/27/2013, 7:12 AM [facility] Pager [phone]

## 2013-04-27 NOTE — Progress Notes (Signed)
PARENTERAL NUTRITION CONSULT NOTE - FOLLOW UP  Pharmacy Consult for TNA Indication: chronic/complicated pancreatitis w/ pancreatic cyst, poor po intake  Allergies  Allergen Reactions  . Penicillins Anaphylaxis  . Latex Itching  . Morphine And Related Nausea And Vomiting    Patient Measurements: Height: 5\' 4"  (162.6 cm) Weight: 245 lb 6 oz (111.3 kg) IBW/kg (Calculated) : 54.7  Vital Signs: Temp: 97.9 F (36.6 C) (09/10 0625) Temp src: Oral (09/10 0625) BP: 111/65 mmHg (09/10 0625) Pulse Rate: 64 (09/10 0625) Intake/Output from previous day: 09/09 0701 - 09/10 0700 In: 480 [I.V.:480] Out: 2450 [Urine:2150; Emesis/NG output:300] Intake/Output from this shift:    Labs:  Recent Labs  04/25/13 0400  WBC 8.8  HGB 10.4*  HCT 32.1*  PLT 276     Recent Labs  04/25/13 0400 04/26/13 0455  NA 137 136  K 3.7 3.8  CL 104 104  CO2 26 26  GLUCOSE 109* 127*  BUN 11 12  CREATININE 0.52 0.53  CALCIUM 8.5 8.6  MG 2.0  --   PHOS 4.1  --   PROT 6.5  --   ALBUMIN 2.7*  --   AST 13  --   ALT 9  --   ALKPHOS 63  --   BILITOT 0.2*  --   PREALBUMIN 9.2*  --   TRIG 207*  --    Estimated Creatinine Clearance: 119.8 ml/min (by C-G formula based on Cr of 0.53).    Recent Labs  04/25/13 2144 04/26/13 0732 04/26/13 2147  GLUCAP 97 136* 114*   CBGs & Insulin requirements past 24 hours:  - CBGs <150  Nutritional Goals:  - RD recs 9/4: 1650-1850 kCal, 80-100 g/protein per day - Clinimix E 5/15, cyclic 1843ml/day and lipids at 42ml/hr over 18 hrs will provide 90g protein and 2106kCal per day.  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 Pt's cyst enlarged recently and pt underwent US and bx 04/14/13. Pt developed pain/N/V post-procedure. Surgery on board, recommended definitive treatment at a tertiary center. Pt with decreased PO  intake - TNA started 9/5.  Anticipate patient will go home on TNA until pt can be seen at tertiary center.    9/10: Day# 7 TNA and day# 3 cyclic TNA over 18hrs. Pt tolerating well. I/O negative. UOP good. BP remains low but stable. Discharge when symptoms under control per Surgery.   GI stopping Duragesic thinking it could be causing some of the nausea. Will use Dilaudid IV prn.  Current nutrition:  - Diet: Clear liquid since 9/3, not able to tolerate. - Cycle TNA (1800 ml total) over 18 hours @1800 -1900: 50 ml/hr @1900 -1100: 106 ml/hr @1100 -1200: 50 ml/hr - IVFE over 18 hours at 13 ml/hr - mIVF: D5NS + 69meq/L KCl at [facility]  Labs: Electrolytes: (no labs today 9/10). Renal Function: Scr stable/wnl Hepatic Function: wnl Pre-Albumin: 9.2, not rising yet Triglycerides: trended up to 207(9/8) CBGs: controlled w/ no evidence of rebound hypoglycemia while off TNA.   Plan:  Shorten TNA duration for more time off during the day and this will slightly decrease Kcal and protein to hopefully stimulate appetite. Clinimix E 5/15 to be infused over 14hrs as follows: 6p 7p: 32ml/hr, 7p-7a: 135ml/hr, 7a-8a: 39ml/hr, 8a-6p: OFF.  Change IV fat emulsion 20% to 63ml/hr over 14hrs daily.  This new regimen will provide: 1655Kcal and 83g protein per day.  Standard multivitamins and trace elements in TNA daily.  Continue IVF at [facility].  Cont CBGs at 0800 and 2200 only to minimize sticks.  TNA labs Monday/Thursdays.  Pharmacy will follow up daily.  Charolotte Eke, PharmD, pager [phone]. 04/27/2013,7:37 AM.

## 2013-04-27 NOTE — Progress Notes (Signed)
[facility] PROGRESS NOTE  Mary [name] WUJ:[id number] DOB: 1975/12/26 DOA: 04/14/2013 PCP: Dorrene German, MD   Assessment/Plan: Acute pancreatitis post EUS and FNA of pancreatic cyst, complicating chronic pancreatitis/pancreatic cyst/intractable nausea. - Patient has had a difficult hospital course. She has not been able to tolerate any oral intake consistently. - GI and surgery are consulting. They recommend followup with surgeons/Dr. Rise Mu at [facility] in an ambulatory setting for possible surgery. Patient will go home when symptomatically better. - It is felt that her nausea is out of proportion to her radiographically very mild pancreatitis. There may be underlying functional/psychosocial issues contributing.  -will Continue rectal Phenergan PRN and start reglan  -low-dose Ativan added on 9/9 as needed.  -? Also secondary to narcotics; fentanyl has been discontinue and dilaudid has been discontinue; will use oxycodone by mouth as needed. - Discussed with Dr. Marilynn Rail on 04/26/13 who indicates in receipt of all patients documents and reiterates that she will need to see him in an ambulatory setting. - Start oral pancreatic enzymes   -if tolerates PO will d/c home in am.  Low AM cortisol - This was done to evaluate other possible causes for patient's nausea and vomiting. - Cosyntropin test: reviewed with an Endocrinologist on 9/8: not in keeping with adrenal insufficiency. - TSH, free T4 - both normal - No further work up  Diarrhea x2 months/terminal ileitis - Had terminal ileitis on CT in July which was suspected to be infectious and completed treatment with Cipro and Flagyl.  - Terminal ileitis resolved on repeat CT this admission. - Resolved. Now with constipation  Anemia - No active bleeding. Stable. - Hgb 10.4 -will recommend MV and ferrous sulfate at discharge  Severe protein calorie malnutrition - Management per nutritionist.  - starting to tolerate PO  better according to patient -plan is to discontinue TNA and encourage PO intake  Abnormal CT & MRI Head/Extensive leukoencephalopathy - Incidental finding. Unclear etiology. - Neurology consultation appreciated-indicates that her white matter changes on MRI brain could be secondary to nutrient deficiencies related to chronic GI issues. She has not had any neurological symptoms to suggest multiple sclerosis, though the MRI could be suggestive of this disease. - Underwent LP on 9/8: Oligoclonal bands (continue to be pending)-will need followup with [facility] as outpatient and patient has indicated to me that she will follow up with them. - Neurology also recommends repeat imaging and 3-6 months to assess for stability of these lesions.   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 Currently asymptomatic -Outpatient followup as deemed necessary.  DVT prophylaxis:  SCD's. Lines/catheters: PICC Nutrition: TNA, CLd and advance to low fat Code Status: Full Family Communication: None. Disposition Plan: Home when symptom controlled and OP consultation at [facility]   Consultants:  [facility]  General surgery  Neurology  Procedures:  PICC  Lumbar puncture 04/25/13  Antibiotics:  None   Subjective: Feeling better; wants to go home. Patient currently denying any vomiting and reports that pain has improved.  Objective: Filed Vitals:   04/26/13 1430 04/26/13 2140 04/27/13 0625 04/27/13 1440  BP: 105/63 101/61 111/65 103/64  Pulse: 102 64 64 72  Temp: 98.3 F (36.8 C) 98.2 F (36.8 C) 97.9 F (36.6 C) 98.4 F (36.9 C)  TempSrc: Oral Oral Oral Oral  Resp: 20 16 16 16   Height:      Weight:      SpO2: 96% 96% 94% 98%  Intake/Output Summary (Last 24 hours) at 04/27/13 1616 Last data filed at 04/27/13 1400  Gross per 24 hour  Intake    540 ml  Output   3100 ml  Net  -2560 ml   Filed Weights   04/14/13 2100   Weight: 111.3 kg (245 lb 6 oz)     Exam:  General exam: Obese female patient, afebrile, NAD; want's to go home. Respiratory system: Clear. No increased work of breathing. Cardiovascular system: S1 & S2 heard, RRR. No JVD, murmurs, gallops, clicks or pedal edema. Gastrointestinal system: Abdomen is nondistended, soft and no obvious tenderness. Normal bowel sounds heard. Central nervous system: Alert and oriented. No focal neurological deficits. Extremities: Symmetric 5 x 5 power.   Data Reviewed: Basic Metabolic Panel:  Recent Labs Lab 04/21/13 0505 04/22/13 0555 04/23/13 0415 04/25/13 0400 04/26/13 0455  NA  --  138 138 137 136  K  --  3.5 3.5 3.7 3.8  CL  --  107 106 104 104  CO2  --  23 24 26 26   GLUCOSE  --  115* 91 109* 127*  BUN  --  <3* 3* 11 12  CREATININE 0.54 0.54 0.59 0.52 0.53  CALCIUM  --  8.4 8.5 8.5 8.6  MG  --  1.7 1.8 2.0  --   PHOS  --  3.6 4.1 4.1  --    Liver Function Tests:  Recent Labs Lab 04/22/13 0555 04/25/13 0400  AST 22 13  ALT 20 9  ALKPHOS 68 63  BILITOT 0.2* 0.2*  PROT 6.5 6.5  ALBUMIN 2.7* 2.7*   CBC:  Recent Labs Lab 04/21/13 0505 04/22/13 0555 04/25/13 0400  WBC 6.6 6.7 8.8  NEUTROABS  --  3.4 4.7  HGB 10.7* 10.4* 10.4*  HCT 33.2* 32.3* 32.1*  MCV 87.8 87.8 87.0  PLT 264 276 276   CBG:  Recent Labs Lab 04/24/13 2216 04/25/13 0752 04/25/13 2144 04/26/13 0732 04/26/13 2147  GLUCAP 110* 133* 97 136* 114*    Recent Results (from the past 240 hour(s))  GRAM STAIN     Status: None   Collection Time    04/25/13 11:20 AM      Result Value Range Status   Specimen Description CSF   Final   Special Requests NONE   Final   Gram Stain     Final   Value: NO ORGANISMS SEEN     NO WBC SEEN     Gram Stain Report Called to,Read Back By and Verified With: BLOCK,D. AT 1321 ON 09.08.14 BY LOVE,T.   Report Status 04/25/2013 FINAL   Final      Additional labs: 1. Diagnosis FINE NEEDLE ASPIRATION, ENDOSCOPIC,  PANCREAS CYST FLUID: NO MALIGNANT CELLS IDENTIFIED. FINDINGS CONSISTENT WITH THE CONTENTS OF A CYST.   Studies: No results found.      Scheduled Meds: . lipase/protease/amylase  2 capsule Oral TID AC  . LORazepam  0.5 mg Intravenous Q8H  . metoCLOPramide  5 mg Oral TID AC & HS  . ondansetron  4 mg Intravenous BID  . [START ON 04/28/2013] pantoprazole  40 mg Oral Q1200   Continuous Infusions: . dextrose 5 % and 0.9 % NaCl with KCl 20 mEq/L 20 mL/hr at 04/25/13 1652    Principal Problem:   Chronic pancreatitis with chronic pseudocysts Active Problems:   Obesity, morbid   Pancreatic cyst   history of recurrent ideopathic pancreatitis   Pancreatitis, acute   Protein-calorie malnutrition, severe   Anemia  Diarrhea   Persistent vomiting   ? Adrenal insufficiency- Work up negative   Abnormal MRI   Low serum cortisol level    Time spent: 30 minutes    Avanthika Dehnert  [facility] Pager [phone].   If 8PM-8AM, please contact night-coverage at [email or web address], password [facility] 04/27/2013, 4:16 PM  LOS: 13 days

## 2013-04-28 LAB — BASIC METABOLIC PANEL
BUN: 8 mg/dL (ref 6–23)
CO2: 25 mEq/L (ref 19–32)
Calcium: 9.2 mg/dL (ref 8.4–10.5)
Creatinine, Ser: 0.53 mg/dL (ref 0.50–1.10)
GFR calc non Af Amer: >90 mL/min (ref >90)
Glucose, Bld: 95 mg/dL (ref 70–99)

## 2013-04-28 MED ORDER — ENSURE COMPLETE PO LIQD: 237.0000 mL | Freq: Two times a day (BID) | ORAL | Status: DC

## 2013-04-28 MED ORDER — OXYCODONE HCL 5 MG PO TABS: 5.0000 mg | ORAL_TABLET | Freq: Four times a day (QID) | ORAL | Status: DC | PRN

## 2013-04-28 MED ORDER — PROMETHAZINE HCL 12.5 MG RE SUPP: 12.5000 mg | Freq: Four times a day (QID) | RECTAL | Status: DC | PRN

## 2013-04-28 MED ORDER — ONDANSETRON 4 MG PO TBDP: 4.0000 mg | ORAL_TABLET | Freq: Three times a day (TID) | ORAL | Status: DC | PRN

## 2013-04-28 MED ORDER — METOCLOPRAMIDE HCL 5 MG PO TABS: 5.0000 mg | ORAL_TABLET | Freq: Three times a day (TID) | ORAL | Status: DC

## 2013-04-28 MED ORDER — OXYCODONE HCL 5 MG PO TABS
5.0000 mg | ORAL_TABLET | Freq: Four times a day (QID) | ORAL | Status: DC | PRN
  Administered 2013-04-28: 5 mg via ORAL
  Filled 2013-04-28: qty 1

## 2013-04-28 NOTE — Progress Notes (Signed)
NUTRITION FOLLOW UP  Intervention:   - Educated pt on diet therapy for nausea/vomiting, low fat diet for pancreatitis, and high protein diet for possible surgery - Recommend multivitamin 1 tablet PO daily once nausea/vomiting resolves - recommend home regimen of 2 multivitamins per day with 1500mg  calcium citrate and 350-540mcg sublingual B12 r/t pt's history of roux en Y to prevent nutrient deficiencies in addition to 18-27 mg/day of elemental iron r/t pt with anemia  - Will continue to monitor   Nutrition Dx:   Inadequate oral intake related to clear liquid diet as evidenced by diet order - ongoing but now related to nausea/vomiting as evidenced by pt report.   Goal: TPN to meet >90% of estimated nutritional needs - not met, TPN d/c.   New goal: 1. No further nausea/vomiting 2. Pt to consume >90% of meals   Monitor:   Weights, labs, intake, nausea/vomiting  Assessment:   Noted GI stopped IV dilaudid and fentanyl patch yesterday to see if nausea improves. Pt without vomiting yesterday. TPN d/c yesterday. Met with pt who reports having 3 episodes of vomiting this morning, first was green, second was burgundy, third was light brown. Pt tearful, stating she wants to go home. Pt agreeable to trying some gingerale and saltine crackers. Pt denies any diarrhea today. No new weights.   Discussed pt with MD as he plans to d/c pt. Pt's emesis discussed. He wanted pt to be educated on appropriate diet at d/c.   Met with pt again this morning to discuss diet to follow at discharge. Emphasized importance of low fat diet for pancreatitis in addition to high protein diet in preparation for possible surgery. Diet therapy for nausea/vomiting discussed. Discussed nutritional supplements. Handouts provided with RD contact information. Pt expressed understanding.    Most recent lipase WNL. Triglycerides slightly elevated. PALB low.   Lipase  Date Value Range Status  04/18/2013 11  11 - 59 U/L Final      Height: Ht Readings from Last 1 Encounters:  04/14/13 5\' 4"  (1.626 m)    Weight Status:   Wt Readings from Last 1 Encounters:  04/14/13 245 lb 6 oz (111.3 kg)    Re-estimated needs:  Kcal: 1650-1850  Protein: 80-100g  Fluid: 1.6-1.8L/day  Skin: Intact  Diet Order: Fat Restricted   Intake/Output Summary (Last 24 hours) at 04/28/13 0943 Last data filed at 04/28/13 0600  Gross per 24 hour  Intake    600 ml  Output   1500 ml  Net   -900 ml    Last BM: 9/10   Labs:   Recent Labs Lab 04/22/13 0555 04/23/13 0415 04/25/13 0400 04/26/13 0455 04/28/13 0540  NA 138 138 137 136 139  K 3.5 3.5 3.7 3.8 3.5  CL 107 106 104 104 106  CO2 23 24 26 26 25   BUN <3* 3* 11 12 8   CREATININE 0.54 0.59 0.52 0.53 0.53  CALCIUM 8.4 8.5 8.5 8.6 9.2  MG 1.7 1.8 2.0  --   --   PHOS 3.6 4.1 4.1  --   --   GLUCOSE 115* 91 109* 127* 95    CBG (last 3)   Recent Labs  04/26/13 0732 04/26/13 2147 04/27/13 2156  GLUCAP 136* 114* 88    Scheduled Meds: . lipase/protease/amylase  2 capsule Oral TID AC  . LORazepam  0.5 mg Intravenous Q8H  . metoCLOPramide  5 mg Oral TID AC & HS  . ondansetron  4 mg Intravenous BID  . pantoprazole  40 mg Oral Daily    Continuous Infusions: . dextrose 5 % and 0.9 % NaCl with KCl 20 mEq/L 20 mL/hr at 04/27/13 1910    Levon Hedger MS, RD, LDN [phone] Pager [phone] After Hours Pager

## 2013-04-28 NOTE — Progress Notes (Signed)
[facility] Progress Note    Since last GI note: Iv narcotic pain meds were stopped yesterday.  Today, less nausea.  She ate small amount of solid food last night without nausea or vomiting.  Objective: Vital signs in last 24 hours: Temp:  [98 F (36.7 C)-98.4 F (36.9 C)] 98 F (36.7 C) (09/11 0555) Pulse Rate:  [58-72] 58 (09/11 0555) Resp:  [16] 16 (09/11 0555) BP: (101-113)/(60-71) 113/71 mmHg (09/11 0555) SpO2:  [95 %-98 %] 96 % (09/11 0555) Last BM Date: 04/23/13 General: alert and oriented times 3 Heart: regular rate and rythm Abdomen: soft, non-tender, non-distended, normal bowel sounds   Lab Results: No results found for this basename: WBC, HGB, PLT, MCV,  in the last 72 hours  Recent Labs  04/26/13 0455 04/28/13 0540  NA 136 139  K 3.8 3.5  CL 104 106  CO2 26 25  GLUCOSE 127* 95  BUN 12 8  CREATININE 0.53 0.53  CALCIUM 8.6 9.2   Medications: Scheduled Meds: . lipase/protease/amylase  2 capsule Oral TID AC  . LORazepam  0.5 mg Intravenous Q8H  . metoCLOPramide  5 mg Oral TID AC & HS  . ondansetron  4 mg Intravenous BID  . pantoprazole  40 mg Oral Daily   Continuous Infusions: . dextrose 5 % and 0.9 % NaCl with KCl 20 mEq/L 20 mL/hr at 04/27/13 1910   PRN Meds:.acetaminophen, acetaminophen, bisacodyl, diphenhydrAMINE, HYDROmorphone, polyvinyl alcohol, promethazine, sodium chloride    Assessment/Plan: 37 y.o. female with improving nausea  I think that a good portion of her nausea was due to the narcotic pain meds that were being used to treat her pancreatitis.  She still doesn't feel great, but she definitely feels better since stopping the iv pain meds.  TNA was stopped.  She will be safe for d/c if she tolerates breakfast.  She should not be given more narcotic pain meds unless she has severe pains.    Rob Bunting, MD  04/28/2013, 8:30 AM [facility] Pager [phone]

## 2013-04-28 NOTE — Progress Notes (Signed)
Patient discharged to home via wheelchair, discharge instructions reviewed with patient who verbalized understanding, new RX's given to patient. 

## 2013-04-28 NOTE — Discharge Summary (Signed)
Physician Discharge Summary  Mary [name] ZOX:[id number] DOB: July 30, 1976 DOA: 04/14/2013  PCP: Dorrene German, MD  Admit date: 04/14/2013 Discharge date: 04/28/2013  Time spent: >30 minutes  Recommendations for Outpatient Follow-up:  1. BMET to follow electrolytes and renal function 2. Follow oligoclonal bands final results from LP and make sure patient has arranged follow up with neurology 3. CBC to follow Hgb trend  Discharge Diagnoses:  Principal Problem:   Chronic pancreatitis with chronic pseudocysts Active Problems:   Obesity, morbid   Pancreatic cyst   history of recurrent ideopathic pancreatitis   Pancreatitis, acute   Protein-calorie malnutrition, severe   Anemia   Diarrhea   Persistent vomiting   ? Adrenal insufficiency- Work up negative   Abnormal MRI   Low serum cortisol level   Discharge Condition: stable and improved. Still with mild nausea, but overall able to keep full liquid diet down. Patient reported that she want to advance diet at home and ask to be discharge; she express understanding on decisions and endorses will follow instructions and follow ups as recommended.  Diet recommendation: low fat diet  Filed Weights   04/14/13 2100  Weight: 111.3 kg (245 lb 6 oz)    History of present illness:  37 y.o. 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 Her cyst has become somewhat larger recently and she was hospitalized late July into early August with pancreatitis and terminal ileitis with severe diarrhea. She was discharged home to complete a course of cipro/flagyl, but had no change in her 4-5 loose BMs daily with mucous but no blood or melena. Today, she presented routinely for endoscopic ultrasound and biopsy of her pancreatic cyst which was done by Dr. Christella Hartigan, [facility]. She had 16mL of thick clear fluid  removed without complication and it was sent for cytology, amylase/lipase. Tests are pending. She felt well post-procedure, but around 3pm developed 6/10 epigastric pain without radiation to the back or elsewhere and nausea and vomiting, nonbilious/nonbloody. She took phenergan without relief and her pain escalated to 9/10 and she came to the ER where she had XR abd which demonstrated a single mildly prominent small bowel loop in the left upper quadrant, normal LFTs, but elevated lipase. BMP and CBC are wnl. She is being admitted for pain and nausea control. GI, Dr. Arlyce Dice, has reviewed the case and recommends no imaging at this time as this is a known complication of the procedure she had this morning. She does not take any chronic medications regularly and does not drink alcohol. She is s/p cholecystectomy.   Hospital Course:  Acute pancreatitis post EUS and FNA of pancreatic cyst, complicating chronic pancreatitis/pancreatic cyst/intractable nausea.  - Patient has had a difficult hospital course. She has not been able to tolerate any oral intake consistently.  - GI and surgery are consulting. They recommend followup with surgeon (Dr. Rise Mu at [facility] in an ambulatory setting for possible surgery).   - It is felt that her nausea is out of proportion to her radiographically very mild pancreatitis. There may be underlying functional/psychosocial issues contributing.  -will Continue rectal Phenergan PRN and zofran ODT -? Also secondary to narcotics; fentanyl has been discontinue and dilaudid has been discontinue; will use oxycodone by mouth as needed for severe pain only..  - Discussed with Dr. Marilynn Rail on 04/26/13 who indicates he has received all patients documents and  reiterates that she will need to see him in an ambulatory setting for further evaluation and treatment.  -diet to be advance as tolerated; continue pancreatic enzymes and now QID reglan (in case patient has cyclic vomiting  syndrome).  Low AM cortisol  - This was done to evaluate other possible causes for patient's nausea and vomiting.  - Cosyntropin test: reviewed with an Endocrinologist on 9/8: results not consistently with adrenal insufficiency.  - TSH, free T4 - both normal  - No further work up recommended.  Diarrhea x2 months/terminal ileitis  -Had terminal ileitis on CT in July which was suspected to be infectious and completed treatment with Cipro and Flagyl.  -Terminal ileitis resolved on repeat CT this admission.  -Resolved and no longer an issue.  Anemia  - No active bleeding. Stable.  - Hgb 10.4  -will recommend MV and ferrous sulfate at discharge  -CBC during follow up with PCP to follow Hgb trend  Acute Severe protein calorie malnutrition  -starting to tolerate PO better according to patient; encourage to advance diet slowly to low fat diet -ensure BID as been recommended -Daily multivitamin  Abnormal CT & MRI Head/Extensive leukoencephalopathy  - Incidental finding. Unclear etiology.  - Neurology consultation appreciated-indicates that her white matter changes on MRI brain could be secondary to nutrient deficiencies related to chronic GI issues. She has not had any neurological symptoms to suggest multiple sclerosis, though the MRI could be suggestive of this disease.  - Underwent LP on 9/8: Oligoclonal bands (continue to be pending)-will need followup with [facility] as outpatient and patient has indicated to me that she will follow up with them.  - Neurology also recommends repeat imaging and 3-6 months to assess for stability of these lesions.   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 Currently asymptomatic  -Outpatient followup as deemed necessary.   Procedures:  See below for x-ray reports  LP (oligoclonal bands results pending)  Consultations:  GI  [facility]  Neurology  Discharge Exam: Filed Vitals:    04/28/13 0555  BP: 113/71  Pulse: 58  Temp: 98 F (36.7 C)  Resp: 16    General: afebrile, no acute complaints currently; patient reports 3 episodes of vomiting (scant amount, no blood); reports that since then she feels better and is currently no nauseated. Wants to go home. Cardiovascular: S1 and S2, no rubs or gallops Respiratory: CTA bilaterally Abdomen: soft, NT, positive BS Extremities: no edema or cyanosis   Discharge Instructions  Discharge Orders   Future Orders Complete By Expires   Discharge instructions  As directed    Comments:     Arrange follow up with surgery service at [facility] as instructed Take medications as prescribed Keep yourself hydrated Advance diet slowly and follow low fat diet       Medication List    STOP taking these medications       ciprofloxacin 500 MG tablet  Commonly known as:  CIPRO     HYDROcodone-acetaminophen 10-325 MG per tablet  Commonly known as:  NORCO     promethazine 25 MG tablet  Commonly known as:  PHENERGAN  Replaced by:  promethazine 12.5 MG suppository      TAKE these medications       feeding supplement Liqd  Take 237 mLs by mouth 2 (two) times daily between meals.     metoCLOPramide 5 MG tablet  Commonly known as:  REGLAN  Take 1  tablet (5 mg total) by mouth 4 (four) times daily -  before meals and at bedtime.     ondansetron 4 MG disintegrating tablet  Commonly known as:  ZOFRAN-ODT  Take 1 tablet (4 mg total) by mouth every 8 (eight) hours as needed for nausea.     oxyCODONE 5 MG immediate release tablet  Commonly known as:  Oxy IR/ROXICODONE  Take 1 tablet (5 mg total) by mouth every 6 (six) hours as needed (severe pain).     Pancrelipase (Lip-Prot-Amyl) 3000-9500 UNITS Cpep  Take 2 capsules by mouth 3 (three) times daily before meals.     pantoprazole 40 MG tablet  Commonly known as:  PROTONIX  Take 1 tablet (40 mg total) by mouth daily at 6 (six) AM.     promethazine 12.5 MG suppository   Commonly known as:  PHENERGAN  Place 1 suppository (12.5 mg total) rectally every 6 (six) hours as needed for nausea.       Allergies  Allergen Reactions  . Penicillins Anaphylaxis  . Latex Itching  . Morphine And Related Nausea And Vomiting       Follow-up Information   Follow up with AVBUERE,EDWIN A, MD. Schedule an appointment as soon as possible for a visit in 2 weeks.   Specialty:  Internal Medicine   Contact information:   [address] [place] [address] [phone]       Call Rise Mu, MD. (to set up appointment)    Specialty:  General Surgery   Contact information:   [address] [facility] FLO[address] [place] [place] [place] [address] [phone]        The results of significant diagnostics from this hospitalization (including imaging, microbiology, ancillary and laboratory) are listed below for reference.    Significant Diagnostic Studies: Ct Head Wo Contrast  04/22/2013   *RADIOLOGY REPORT*  Clinical Data: Nausea and vomiting.  CT HEAD WITHOUT CONTRAST  Technique:  Contiguous axial images were obtained from the base of the skull through the vertex without contrast.  Comparison: None.  Findings: Extensive hypodensity throughout the cerebral white matter bilaterally.  This is primarily subcortical in location and is symmetric and bilateral.  Ventricle size is normal.  Cerebral volume is normal.  Brainstem and cerebellum are normal by CT.  Negative for hemorrhage.  IMPRESSION: Extensive leukoencephalopathy.  No prior studies available to determine if this is acute or chronic.  Differential diagnosis includes multiple sclerosis and chronic ischemia.  Hypertensive and metabolic encephalopathy are possible.  MRI brain without with contrast is suggested for further evaluation.  I discussed the findings by telephone with Dr. Waymon Amato   Original Report Authenticated By: Janeece Riggers, M.D.   Mr Laqueta Jean Wo Contrast  04/22/2013   *RADIOLOGY REPORT*  Clinical  Data: Abnormal head CT.  Nausea and vomiting.  History of pancreatitis.  Terminal ileitis.  Protein calorie malnutrition.  MRI HEAD WITHOUT AND WITH CONTRAST  Technique:  Multiplanar, multiecho pulse sequences of the brain and surrounding structures were obtained according to standard protocol without and with intravenous contrast  Contrast: 20mL MULTIHANCE GADOBENATE DIMEGLUMINE 529 MG/ML IV SOLN  Comparison: 04/22/2013 head CT.  No comparison brain MR.  Findings: Sequences are motion degraded.  No acute infarct.  No intracranial hemorrhage.  Diffuse white matter type changes throughout the supratentorial region as well as involving the pons.  Etiology indeterminate. Considerations include white matter type changes secondary to; demyelinating process (multiple sclerosis), small vessel disease, inflammatory process, vasculitis, metabolic abnormality, result of drug  therapy or toxic exposure or malnutrition.  Sub centimeter cystic structure posterior right opercular region with surrounding T2 altered signal intensity without enhancement. This may reflect the same process causing white matter changes. Overall, no abnormal intracranial enhancing lesion.  No hydrocephalus or significant atrophy.  Major intracranial vascular structures are patent.  Cervical medullary junction, pituitary region, pineal region and orbital structures unremarkable.  IMPRESSION: Diffuse nonspecific white matter type changes as detailed above.   Original Report Authenticated By: Lacy Duverney, M.D.   Ct Abdomen Pelvis W Contrast  04/19/2013   *RADIOLOGY REPORT*  Clinical Data: History of recurrent pancreatitis.  Stent placement in 2007.  CT ABDOMEN AND PELVIS WITH CONTRAST  Technique:  Multidetector CT imaging of the abdomen and pelvis was performed following the standard protocol during bolus administration of intravenous contrast.  Contrast: 25mL OMNIPAQUE IOHEXOL 300 MG/ML  SOLN, OMNIPAQUE IOHEXOL 300 MG/ML  SOLN  Comparison: Acute  abdomen series 04/14/2013.  CT 03/15/2013.  Findings: Lung bases:  Clear lung bases.  Normal heart size without pericardial or pleural effusion.  Small para esophageal nodes which are likely reactive.  Abdomen/pelvis:  Moderate hepatic steatosis.  Normal spleen, stomach.  Pancreatic body/tail junction macrolobulated cystic lesion measures 4.4 x 3.7 cm on image 24/series 2.  Similar to 4.4 x 3.9 cm at the same level on the prior exam.  Peripancreatic fat planes in the region of the neck and head are somewhat ill-defined.  This could be due to patient body habitus, but mild superimposed acute pancreatitis is difficult to exclude.  No peripancreatic fluid collection or evidence of pancreatic ductal dilatation.  Cholecystectomy without biliary ductal dilatation.  Normal right adrenal gland.  Similar left adrenal nodule of 1.6 cm. Suspect a low density interpolar left renal lesion, most likely a small cyst.  No retroperitoneal or retrocrural adenopathy.  Mildly prominent porta hepatis nodes which are likely reactive and related to steatosis.  Collaterals in the gastroepiploic distribution suggest splenic vein insufficiency, likely in the region of the pancreatic tail.  Normal colon and terminal ileum.  Prior enterotomy.  Otherwise, normal appearance of small bowel loops.  Right external iliac node of 1.3 cm on image 75.  1.0 cm on the prior. More inferior right external iliac node there is  1.4 cm and is not significantly changed.  Normal urinary bladder and uterus, without adnexal mass or significant free pelvic fluid.  Fat containing ventral abdominal wall hernias.  Bones/Musculoskeletal:  No acute osseous abnormality.  IMPRESSION:  1.  Resolution of previously described terminal ileitis. 2.  Similar macrolobulated cystic lesion in the pancreatic tail/body junction.  Given history of pancreatitis, most likely a pseudocyst.  Cystic pancreatic neoplasm such as mucinous cystadenoma or oligocystic serous cystadenoma could  look similar. Imaging surveillance suggested.  This should optimally be performed with pre and post contrast MRI at approximately 6 months. 3.  Slight increase in pelvic adenopathy, presumed to be reactive. 4.  Cannot exclude mild pancreatitis involving the head and neck. 5.  Hepatic steatosis. 6.  Fat containing ventral abdominal wall hernias. 7.  Similar left adrenal nodule.  Most likely an adenoma. Technically indeterminate. 8.  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   Original Report Authenticated By: Jeronimo Greaves, M.D.   Dg Abd Acute W/chest  04/14/2013   *RADIOLOGY REPORT*  Clinical Data: Abdominal pain, history of hernia repair 03/24/2013, past history GERD, pancreatitis  ACUTE ABDOMEN SERIES (ABDOMEN 2 VIEW & CHEST 1 VIEW)  Comparison: Abdominal radiograph 03/20/2013  Findings: Upper-normal size of cardiac silhouette. Mediastinal contours and pulmonary vascularity normal. Minimal peribronchial thickening without infiltrate, pleural effusion or pneumothorax. Question old fracture posterior right seventh rib. Bilateral nipple rings. Surgical clips right upper quadrant. Single dilated small bowel loop in left upper quadrant, nonspecific. Gas and stool present in colon. No definite bowel obstruction, bowel wall thickening or free intraperitoneal air. Bones appear diffusely demineralized. No urinary tract calcification.  IMPRESSION: Nonspecific single mildly prominent small bowel loop in left upper quadrant. No additional acute findings.   Original Report Authenticated By: Ulyses Southward, M.D.   Dg Fluoro Guide Lumbar Puncture  04/25/2013   *RADIOLOGY REPORT*  Clinical Data:Abnormal MRI.  Possible multiple sclerosis.  LUMBAR PUNCTURE FLUORO GUIDE  Fluoroscopy Time: 1 minute, 31 seconds  Comparison: MRI and CT of the head on 04/22/2013  Findings: Following informed, written consent, the fluoroscopy was used to identify the L3-4 site.  Using sterile technique,  fluoroscopic guidance, and local anesthesia, a lumbar puncture was achieved using a 10 cm 22 gauge spinal needle.  Opening pressure was 10 cm of water.  A total of 17 ml of clear CSF was collected for analysis.  In the initial tube was noted to be pink-tinged. However, subsequent tubes showed clearing of the color.  IMPRESSION: Lumbar puncture at L3-4.  No apparent complications.   Original Report Authenticated By: Norva Pavlov, M.D.    Microbiology: Recent Results (from the past 240 hour(s))  GRAM STAIN     Status: None   Collection Time    04/25/13 11:20 AM      Result Value Range Status   Specimen Description CSF   Final   Special Requests NONE   Final   Gram Stain     Final   Value: NO ORGANISMS SEEN     NO WBC SEEN     Gram Stain Report Called to,Read Back By and Verified With: BLOCK,D. AT 1321 ON 09.08.14 BY LOVE,T.   Report Status 04/25/2013 FINAL   Final     Labs: Basic Metabolic Panel:  Recent Labs Lab 04/22/13 0555 04/23/13 0415 04/25/13 0400 04/26/13 0455 04/28/13 0540  NA 138 138 137 136 139  K 3.5 3.5 3.7 3.8 3.5  CL 107 106 104 104 106  CO2 23 24 26 26 25   GLUCOSE 115* 91 109* 127* 95  BUN <3* 3* 11 12 8   CREATININE 0.54 0.59 0.52 0.53 0.53  CALCIUM 8.4 8.5 8.5 8.6 9.2  MG 1.7 1.8 2.0  --   --   PHOS 3.6 4.1 4.1  --   --    Liver Function Tests:  Recent Labs Lab 04/22/13 0555 04/25/13 0400  AST 22 13  ALT 20 9  ALKPHOS 68 63  BILITOT 0.2* 0.2*  PROT 6.5 6.5  ALBUMIN 2.7* 2.7*   CBC:  Recent Labs Lab 04/22/13 0555 04/25/13 0400  WBC 6.7 8.8  NEUTROABS 3.4 4.7  HGB 10.4* 10.4*  HCT 32.3* 32.1*  MCV 87.8 87.0  PLT 276 276   CBG:  Recent Labs Lab 04/25/13 0752 04/25/13 2144 04/26/13 0732 04/26/13 2147 04/27/13 2156  GLUCAP 133* 97 136* 114* 88    Signed:  Cathryne Mancebo  [facility] 04/28/2013, 12:06 PM

## 2013-04-29 LAB — OLIGOCLONAL BANDS, CSF + SERM

## 2013-04-29 LAB — PANCREATIC ELASTASE, FECAL: Pancreatic Elastase-1, Stool: <15 mcg/g — ABNORMAL LOW

## 2013-04-29 NOTE — Progress Notes (Signed)
Quick Note:  This indicates significant pancreatic insufficiency most likely. Should continue enzyme replacement. She is still hospitalized ______

## 2013-05-01 ENCOUNTER — Encounter (HOSPITAL_COMMUNITY): Payer: Self-pay

## 2013-05-01 ENCOUNTER — Emergency Department (HOSPITAL_COMMUNITY)
Admission: EM | Admit: 2013-05-01 | Discharge: 2013-05-02 | Disposition: A | Discharge status: Home / Self Care | Payer: Self-pay | Attending: Emergency Medicine | Admitting: Emergency Medicine

## 2013-05-01 DIAGNOSIS — K219 Gastro-esophageal reflux disease without esophagitis: Secondary | ICD-10-CM | POA: Insufficient documentation

## 2013-05-01 DIAGNOSIS — Z880 Allergy status to penicillin: Secondary | ICD-10-CM | POA: Insufficient documentation

## 2013-05-01 DIAGNOSIS — Z3202 Encounter for pregnancy test, result negative: Secondary | ICD-10-CM | POA: Insufficient documentation

## 2013-05-01 DIAGNOSIS — Z9889 Other specified postprocedural states: Secondary | ICD-10-CM | POA: Insufficient documentation

## 2013-05-01 DIAGNOSIS — F332 Major depressive disorder, recurrent severe without psychotic features: Secondary | ICD-10-CM

## 2013-05-01 DIAGNOSIS — R109 Unspecified abdominal pain: Secondary | ICD-10-CM | POA: Insufficient documentation

## 2013-05-01 DIAGNOSIS — F329 Major depressive disorder, single episode, unspecified: Secondary | ICD-10-CM | POA: Insufficient documentation

## 2013-05-01 DIAGNOSIS — F17200 Nicotine dependence, unspecified, uncomplicated: Secondary | ICD-10-CM

## 2013-05-01 DIAGNOSIS — Z79899 Other long term (current) drug therapy: Secondary | ICD-10-CM | POA: Insufficient documentation

## 2013-05-01 DIAGNOSIS — Z91040 Latex allergy status: Secondary | ICD-10-CM | POA: Insufficient documentation

## 2013-05-01 DIAGNOSIS — F3289 Other specified depressive episodes: Secondary | ICD-10-CM | POA: Insufficient documentation

## 2013-05-01 DIAGNOSIS — F411 Generalized anxiety disorder: Secondary | ICD-10-CM | POA: Insufficient documentation

## 2013-05-01 DIAGNOSIS — R6889 Other general symptoms and signs: Secondary | ICD-10-CM | POA: Insufficient documentation

## 2013-05-01 DIAGNOSIS — Z9089 Acquired absence of other organs: Secondary | ICD-10-CM | POA: Insufficient documentation

## 2013-05-01 DIAGNOSIS — R45851 Suicidal ideations: Secondary | ICD-10-CM

## 2013-05-01 LAB — ETHANOL: Alcohol, Ethyl (B): <11 mg/dL (ref 0–11)

## 2013-05-01 LAB — COMPREHENSIVE METABOLIC PANEL
Albumin: 3.7 g/dL (ref 3.5–5.2)
Alkaline Phosphatase: 84 U/L (ref 39–117)
BUN: 5 mg/dL — ABNORMAL LOW (ref 6–23)
Calcium: 9.6 mg/dL (ref 8.4–10.5)
Creatinine, Ser: 0.55 mg/dL (ref 0.50–1.10)
GFR calc Af Amer: >90 mL/min (ref >90)
Glucose, Bld: 137 mg/dL — ABNORMAL HIGH (ref 70–99)
Potassium: 3.6 mEq/L (ref 3.5–5.1)
Total Protein: 8.4 g/dL — ABNORMAL HIGH (ref 6.0–8.3)

## 2013-05-01 LAB — CBC
HCT: 40.6 % (ref 36.0–46.0)
Hemoglobin: 13.7 g/dL (ref 12.0–15.0)
MCH: 28.9 pg (ref 26.0–34.0)
MCHC: 33.7 g/dL (ref 30.0–36.0)
MCV: 85.7 fL (ref 78.0–100.0)
RDW: 13.9 % (ref 11.5–15.5)

## 2013-05-01 LAB — RAPID URINE DRUG SCREEN, HOSP PERFORMED
Benzodiazepines: NOT DETECTED
Cocaine: NOT DETECTED
Opiates: NOT DETECTED

## 2013-05-01 LAB — LIPASE, BLOOD: Lipase: 25 U/L (ref 11–59)

## 2013-05-01 LAB — SALICYLATE LEVEL: Salicylate Lvl: <2 mg/dL — ABNORMAL LOW (ref 2.8–20.0)

## 2013-05-01 MED ORDER — METOCLOPRAMIDE HCL 10 MG PO TABS
5.0000 mg | ORAL_TABLET | Freq: Three times a day (TID) | ORAL | Status: DC
  Filled 2013-05-01: qty 1

## 2013-05-01 MED ORDER — PANCRELIPASE (LIP-PROT-AMYL) 12000-38000 UNITS PO CPEP
2.0000 | ORAL_CAPSULE | Freq: Three times a day (TID) | ORAL | Status: DC
  Administered 2013-05-02 (×2): 2 via ORAL
  Filled 2013-05-01 (×4): qty 2

## 2013-05-01 MED ORDER — IBUPROFEN 200 MG PO TABS
600.0000 mg | ORAL_TABLET | Freq: Three times a day (TID) | ORAL | Status: DC | PRN
  Administered 2013-05-01: 600 mg via ORAL
  Filled 2013-05-01: qty 3

## 2013-05-01 MED ORDER — ALUM & MAG HYDROXIDE-SIMETH 200-200-20 MG/5ML PO SUSP: 30.0000 mL | ORAL | Status: DC | PRN

## 2013-05-01 MED ORDER — NICOTINE 21 MG/24HR TD PT24
21.0000 mg | MEDICATED_PATCH | Freq: Every day | TRANSDERMAL | Status: DC
  Administered 2013-05-01: 21 mg via TRANSDERMAL
  Filled 2013-05-01: qty 1

## 2013-05-01 MED ORDER — PANTOPRAZOLE SODIUM 40 MG PO TBEC
40.0000 mg | DELAYED_RELEASE_TABLET | Freq: Every day | ORAL | Status: DC
  Administered 2013-05-02: 40 mg via ORAL
  Filled 2013-05-01: qty 1

## 2013-05-01 MED ORDER — ASPIRIN-ACETAMINOPHEN-CAFFEINE 250-250-65 MG PO TABS
2.0000 | ORAL_TABLET | Freq: Four times a day (QID) | ORAL | Status: DC | PRN
  Filled 2013-05-01: qty 2

## 2013-05-01 MED ORDER — ENSURE COMPLETE PO LIQD
237.0000 mL | Freq: Two times a day (BID) | ORAL | Status: DC
  Filled 2013-05-01 (×3): qty 237

## 2013-05-01 MED ORDER — ONDANSETRON HCL 4 MG PO TABS: 4.0000 mg | ORAL_TABLET | Freq: Three times a day (TID) | ORAL | Status: DC | PRN

## 2013-05-01 MED ORDER — LORAZEPAM 1 MG PO TABS: 1.0000 mg | ORAL_TABLET | Freq: Three times a day (TID) | ORAL | Status: DC | PRN

## 2013-05-01 MED ORDER — ONDANSETRON 4 MG PO TBDP: 4.0000 mg | ORAL_TABLET | Freq: Three times a day (TID) | ORAL | Status: DC | PRN
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
## 2013-05-01 NOTE — ED Notes (Signed)
Patient reports that she is suicidal. Paatient stated she had a plan , but would not reveal. Patient stated, "Oh that would be too easy. Patient states she does not have HI. Patient states she has attempted suicide in the past, but would not reveal what she did. Patient denies alcohol and drug use.
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
## 2013-05-01 NOTE — ED Notes (Signed)
Pt's step-mother took possession of all belongings and took them to the car.

## 2013-05-01 NOTE — ED Notes (Signed)
Pt wanded by security. 

## 2013-05-02 ENCOUNTER — Encounter: Payer: Self-pay | Admitting: Gastroenterology

## 2013-05-02 DIAGNOSIS — Z7189 Other specified counseling: Secondary | ICD-10-CM

## 2013-05-02 DIAGNOSIS — F339 Major depressive disorder, recurrent, unspecified: Secondary | ICD-10-CM

## 2013-05-02 MED ORDER — TRAZODONE HCL 50 MG PO TABS
50.0000 mg | ORAL_TABLET | Freq: Every evening | ORAL | Status: DC | PRN
  Administered 2013-05-02 (×2): 50 mg via ORAL
  Filled 2013-05-02 (×2): qty 1
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
## 2013-05-02 NOTE — Progress Notes (Signed)
[facility] provided pt with a [employer] relations account executive, [employer] of the [place]

## 2013-05-02 NOTE — ED Notes (Signed)
Asked pt about moving to Psych ED room without a door. Pt stated that she would be fine with that.

## 2013-05-02 NOTE — BH Assessment (Signed)
Charles Kober, PA accepted Pt to [facility] pending 500-hall bed. Contacted the following facilities for placement:   [facility]: Per Patsy, at capacity  [facility]: Per Katlynn, at capacity  [facility]: Per Alvita, at capacity  [facility]: Per Katlynn, at capacity  [facility]: Per Delta, at capacity  [facility]: At capacity  [facility]: Per Neal, at capacity  [facility]: Per Melanie, at capacity  [facility]: At capacity  [facility]: Per Danny, at capacity  [facility]: Per Miriam, at capacity  [facility]: At capacity  [facility]: At capacity  [facility]: Per Charles, at capacity  [facility]: Per Susan, at capacity   Othella Slappey Ellis Brookelynne Dimperio Jr, LPC, NCC  Triage Specialist  

## 2013-05-02 NOTE — ED Notes (Signed)
Pt is awake and alert, pleasant and cooperative. Patient denies HI, SI AH or VH. Discharge vitals 118/77 HR 80 RR 16 and unlabored. Pt has outpatient treatment scheduled. Will continue to monitor for safety. Patient escorted to lobby without incident. T.Melvyn Neth RN

## 2013-05-02 NOTE — Consult Note (Signed)
Agreeable with plan

## 2013-05-19 ENCOUNTER — Emergency Department (HOSPITAL_BASED_OUTPATIENT_CLINIC_OR_DEPARTMENT_OTHER)
Admission: EM | Admit: 2013-05-19 | Discharge: 2013-05-19 | Disposition: A | Discharge status: Home / Self Care | Payer: Self-pay | Attending: Emergency Medicine | Admitting: Emergency Medicine

## 2013-05-19 ENCOUNTER — Encounter (HOSPITAL_BASED_OUTPATIENT_CLINIC_OR_DEPARTMENT_OTHER): Payer: Self-pay | Admitting: *Deleted

## 2013-05-19 ENCOUNTER — Emergency Department (HOSPITAL_BASED_OUTPATIENT_CLINIC_OR_DEPARTMENT_OTHER): Payer: Self-pay

## 2013-05-19 DIAGNOSIS — M549 Dorsalgia, unspecified: Secondary | ICD-10-CM | POA: Insufficient documentation

## 2013-05-19 DIAGNOSIS — J069 Acute upper respiratory infection, unspecified: Secondary | ICD-10-CM | POA: Insufficient documentation

## 2013-05-19 DIAGNOSIS — Z880 Allergy status to penicillin: Secondary | ICD-10-CM | POA: Insufficient documentation

## 2013-05-19 DIAGNOSIS — R079 Chest pain, unspecified: Secondary | ICD-10-CM | POA: Insufficient documentation

## 2013-05-19 DIAGNOSIS — Z8639 Personal history of other endocrine, nutritional and metabolic disease: Secondary | ICD-10-CM | POA: Insufficient documentation

## 2013-05-19 DIAGNOSIS — F17200 Nicotine dependence, unspecified, uncomplicated: Secondary | ICD-10-CM | POA: Insufficient documentation

## 2013-05-19 DIAGNOSIS — Z862 Personal history of diseases of the blood and blood-forming organs and certain disorders involving the immune mechanism: Secondary | ICD-10-CM | POA: Insufficient documentation

## 2013-05-19 DIAGNOSIS — Z8659 Personal history of other mental and behavioral disorders: Secondary | ICD-10-CM | POA: Insufficient documentation

## 2013-05-19 DIAGNOSIS — R109 Unspecified abdominal pain: Secondary | ICD-10-CM | POA: Insufficient documentation

## 2013-05-19 DIAGNOSIS — Z91040 Latex allergy status: Secondary | ICD-10-CM | POA: Insufficient documentation

## 2013-05-19 DIAGNOSIS — Z79899 Other long term (current) drug therapy: Secondary | ICD-10-CM | POA: Insufficient documentation

## 2013-05-19 DIAGNOSIS — K219 Gastro-esophageal reflux disease without esophagitis: Secondary | ICD-10-CM | POA: Insufficient documentation

## 2013-05-19 DIAGNOSIS — J45909 Unspecified asthma, uncomplicated: Secondary | ICD-10-CM | POA: Insufficient documentation

## 2013-05-19 MED ORDER — HYDROCODONE-ACETAMINOPHEN 7.5-325 MG/15ML PO SOLN
15.0000 mL | Freq: Four times a day (QID) | ORAL | Status: DC | PRN
Start: 2013-05-19 — End: 2013-07-26

## 2013-05-19 MED ORDER — HYDROMORPHONE HCL PF 1 MG/ML IJ SOLN
0.5000 mg | Freq: Once | INTRAMUSCULAR | Status: AC
  Administered 2013-05-19: 0.5 mg via INTRAMUSCULAR
  Filled 2013-05-19: qty 1

## 2013-05-19 MED ORDER — ALBUTEROL SULFATE HFA 108 (90 BASE) MCG/ACT IN AERS
1.0000 | INHALATION_SPRAY | RESPIRATORY_TRACT | Status: DC | PRN
  Administered 2013-05-19: 1 via RESPIRATORY_TRACT
  Filled 2013-05-19: qty 6.7

## 2013-05-19 MED ORDER — ALBUTEROL SULFATE (2.5 MG/3ML) 0.083% IN NEBU: 2.5000 mg | INHALATION_SOLUTION | Freq: Four times a day (QID) | RESPIRATORY_TRACT | Status: DC | PRN

## 2013-05-19 MED ORDER — ALBUTEROL SULFATE (5 MG/ML) 0.5% IN NEBU
5.0000 mg | INHALATION_SOLUTION | Freq: Once | RESPIRATORY_TRACT | Status: AC
  Administered 2013-05-19: 5 mg via RESPIRATORY_TRACT
  Filled 2013-05-19: qty 1

## 2013-05-19 NOTE — ED Provider Notes (Signed)
CSN: [id number]     Arrival date & time 05/19/13  2007 History  This chart was scribed for Mary Munch, MD by Dorothey Baseman, ED Scribe. This patient was seen in room MH12/MH12 and the patient's care was started at 8:17 PM.    Chief Complaint  Patient presents with  . Cough   The history is provided by the patient. No language interpreter was used.   HPI Comments: Mary [name] is a 37 y.o. female who presents to the Emergency Department complaining of cough onset 4 days ago with associated chest pain and back pain that has been progressively worsening. She denies fever, emesis, diarrhea, rash, hemiparesis. She reports taking Mucinex DM, Chloracol, and other OTC decongestants without relief. She denies history of COPD or emphysema. She reports a history of mild asthma that is untreated. Patient is a current every day smoker, 05. PPD.    Past Medical History  Diagnosis Date  . Hernia 03-24-13    ventral hernia remains   . GERD (gastroesophageal reflux disease)   . Pancreatitis 03-24-13    2002, 2'2013, 03-14-13  . Pancreatic cyst 2002 onset  . Anxiety   . Depression     "recent breakup with partner of 15 yrs"  . Adrenal insufficiency 04/23/2013    ??   Past Surgical History  Procedure Laterality Date  . Abdominal surgery  ~ 2007    some sort of pancreatic cyst drainage.   . Cholecystectomy      ~ age 7-laparoscopic  . Adenoidectomy    . Abdominal surgery      stent to pancreatic cyst that became infected within 36 hours  . Eus N/A 04/14/2013    Procedure: UPPER ENDOSCOPIC ULTRASOUND (EUS) LINEAR;  Surgeon: Rachael Fee, MD;  Location: [facility];  Service: Endoscopy;  Laterality: N/A;   Family History  Problem Relation Age of Onset  . Lupus Neg Hx   . Sarcoidosis Neg Hx   . Pancreatitis Neg Hx   . Colitis Maternal Aunt   . Diabetes Mother   . Diabetes Father   . Diabetes      many family members   History  Substance Use Topics  . Smoking status: Current Every Day  Smoker -- 0.50 packs/day for 20 years    Types: Cigarettes  . Smokeless tobacco: Never Used  . Alcohol Use: No   OB History   Grav Para Term Preterm Abortions TAB SAB Ect Mult Living                 Review of Systems  Constitutional: Negative for fever.  Respiratory: Positive for cough.   Cardiovascular: Positive for chest pain.  Gastrointestinal: Negative for nausea, vomiting and diarrhea.  Musculoskeletal: Positive for back pain.  Skin: Negative for rash.  Neurological: Negative for weakness.  All other systems reviewed and are negative.    Allergies  Penicillins; Latex; and Morphine and related  Home Medications   Current Outpatient Rx  Name  Route  Sig  Dispense  Refill  . aspirin-acetaminophen-caffeine (EXCEDRIN MIGRAINE) 250-250-65 MG per tablet   Oral   Take 2 tablets by mouth every 6 (six) hours as needed for pain.         . feeding supplement (ENSURE COMPLETE) LIQD   Oral   Take 237 mLs by mouth 2 (two) times daily between meals.         . metoCLOPramide (REGLAN) 5 MG tablet   Oral   Take 1 tablet (5 mg  total) by mouth 4 (four) times daily -  before meals and at bedtime.   120 tablet   0   . ondansetron (ZOFRAN-ODT) 4 MG disintegrating tablet   Oral   Take 1 tablet (4 mg total) by mouth every 8 (eight) hours as needed for nausea.   40 tablet   0   . oxyCODONE (OXY IR/ROXICODONE) 5 MG immediate release tablet   Oral   Take 1 tablet (5 mg total) by mouth every 6 (six) hours as needed (severe pain).   40 tablet   0   . Pancrelipase, Lip-Prot-Amyl, 3000-9500 UNITS CPEP   Oral   Take 2 capsules by mouth 3 (three) times daily before meals.   200 capsule   3   . pantoprazole (PROTONIX) 40 MG tablet   Oral   Take 1 tablet (40 mg total) by mouth daily at 6 (six) AM.   30 tablet   12   . promethazine (PHENERGAN) 12.5 MG suppository   Rectal   Place 1 suppository (12.5 mg total) rectally every 6 (six) hours as needed for nausea.   24 each   0     Triage Vitals: BP 155/86  Pulse 99  Temp(Src) 98.2 F (36.8 C) (Oral)  Resp 16  Ht 5\' 4"  (1.626 m)  Wt 225 lb (102.059 kg)  BMI 38.6 kg/m2  SpO2 100%  LMP 04/24/2013  Physical Exam  Nursing note and vitals reviewed. Constitutional: She is oriented to person, place, and time. She appears well-developed and well-nourished. No distress.  HENT:  Head: Normocephalic and atraumatic.  Left Ear: External ear normal.  Mouth/Throat: Oropharynx is clear and moist.  Eyes: Conjunctivae are normal.  Neck: Normal range of motion. Neck supple.  Cardiovascular: Normal rate, regular rhythm and normal heart sounds.   Pulmonary/Chest: Effort normal and breath sounds normal. No respiratory distress.  Musculoskeletal: Normal range of motion.  Mild pain to the left lower right flank.  Neurological: She is alert and oriented to person, place, and time.  Skin: Skin is warm and dry.  Psychiatric: She has a normal mood and affect. Her behavior is normal.    ED Course  Procedures (including critical care time)  Medications  albuterol (PROVENTIL) (5 MG/ML) 0.5% nebulizer solution 5 mg (5 mg Nebulization Given 05/19/13 2103)   DIAGNOSTIC STUDIES: Oxygen Saturation is 100% on room air, normal by my interpretation.    COORDINATION OF CARE: 8:20PM- Will order a chest x-ray and a breathing treatment. Discussed treatment plan with patient at bedside and patient verbalized agreement.   10:22PM- Discussed that negative x-ray results do not indicate pneumonia and that symptoms are likely due to a viral infection. Advised patient to follow up if there are any new or worsening symptoms, especially fever, confusion, or disorientation. Will discharge patient with pain medication and an Albuterol nebulizer as per her request. Discussed treatment plan with patient at bedside and patient verbalized agreement.     Labs Review Labs Reviewed - No data to display  Imaging Review Dg Chest 2 View  05/19/2013    *RADIOLOGY REPORT*  Clinical Data: Discomfort  CHEST - 2 VIEW  Comparison: 04/14/2013  Findings: Heart size and vascular pattern are normal.  Lungs are clear.  No pleural effusions.  IMPRESSION: Negative   Original Report Authenticated By: Esperanza Heir, M.D.   10:25 PM On repeat exam the patient appears comfortable.  We discussed pain medication, albuterol for her persistent cough.  MDM  No diagnosis found.  I personally  performed the services described in this documentation, which was scribed in my presence. The recorded information has been reviewed and is accurate.   This patient presents with URI like symptoms.  On exam the patient has no fever, his hemodynamics are stable.  There is no x-ray evidence of pneumonia.  Patient improved here with a breathing treatment.  We had a lengthy discussion on return precautions, follow up instructions, the patient was appropriate for discharge with further evaluation as an outpatient.   Mary Munch, MD 05/19/13 2226

## 2013-05-19 NOTE — ED Notes (Signed)
Pt c/o pro cough yellow/green sputum x 4 days

## 2013-05-19 NOTE — ED Notes (Signed)
MD at bedside. 

## 2013-06-14 DIAGNOSIS — K859 Acute pancreatitis without necrosis or infection, unspecified: Secondary | ICD-10-CM | POA: Insufficient documentation

## 2013-07-26 ENCOUNTER — Emergency Department (HOSPITAL_COMMUNITY): Payer: Medicaid Other

## 2013-07-26 ENCOUNTER — Encounter (HOSPITAL_COMMUNITY): Payer: Self-pay | Admitting: Emergency Medicine

## 2013-07-26 ENCOUNTER — Inpatient Hospital Stay (HOSPITAL_COMMUNITY)
Admission: EM | Admit: 2013-07-26 | Discharge: 2013-07-31 | DRG: 392 | Disposition: A | Discharge status: Home / Self Care | Payer: Self-pay | Attending: Internal Medicine | Admitting: Internal Medicine

## 2013-07-26 DIAGNOSIS — D649 Anemia, unspecified: Secondary | ICD-10-CM

## 2013-07-26 DIAGNOSIS — K439 Ventral hernia without obstruction or gangrene: Secondary | ICD-10-CM | POA: Diagnosis present

## 2013-07-26 DIAGNOSIS — F1910 Other psychoactive substance abuse, uncomplicated: Secondary | ICD-10-CM | POA: Diagnosis present

## 2013-07-26 DIAGNOSIS — K859 Acute pancreatitis without necrosis or infection, unspecified: Secondary | ICD-10-CM

## 2013-07-26 DIAGNOSIS — Z880 Allergy status to penicillin: Secondary | ICD-10-CM

## 2013-07-26 DIAGNOSIS — Z833 Family history of diabetes mellitus: Secondary | ICD-10-CM

## 2013-07-26 DIAGNOSIS — E0781 Sick-euthyroid syndrome: Secondary | ICD-10-CM | POA: Diagnosis present

## 2013-07-26 DIAGNOSIS — R197 Diarrhea, unspecified: Secondary | ICD-10-CM

## 2013-07-26 DIAGNOSIS — Z87891 Personal history of nicotine dependence: Secondary | ICD-10-CM

## 2013-07-26 DIAGNOSIS — F329 Major depressive disorder, single episode, unspecified: Secondary | ICD-10-CM | POA: Diagnosis present

## 2013-07-26 DIAGNOSIS — R112 Nausea with vomiting, unspecified: Secondary | ICD-10-CM | POA: Diagnosis present

## 2013-07-26 DIAGNOSIS — E2740 Unspecified adrenocortical insufficiency: Secondary | ICD-10-CM

## 2013-07-26 DIAGNOSIS — R1115 Cyclical vomiting syndrome unrelated to migraine: Secondary | ICD-10-CM

## 2013-07-26 DIAGNOSIS — K219 Gastro-esophageal reflux disease without esophagitis: Secondary | ICD-10-CM | POA: Diagnosis present

## 2013-07-26 DIAGNOSIS — R9389 Abnormal findings on diagnostic imaging of other specified body structures: Secondary | ICD-10-CM

## 2013-07-26 DIAGNOSIS — K861 Other chronic pancreatitis: Secondary | ICD-10-CM | POA: Diagnosis present

## 2013-07-26 DIAGNOSIS — F3289 Other specified depressive episodes: Secondary | ICD-10-CM | POA: Diagnosis present

## 2013-07-26 DIAGNOSIS — R109 Unspecified abdominal pain: Principal | ICD-10-CM | POA: Diagnosis present

## 2013-07-26 DIAGNOSIS — K862 Cyst of pancreas: Secondary | ICD-10-CM | POA: Diagnosis present

## 2013-07-26 DIAGNOSIS — K2970 Gastritis, unspecified, without bleeding: Secondary | ICD-10-CM | POA: Diagnosis present

## 2013-07-26 DIAGNOSIS — R839 Unspecified abnormal finding in cerebrospinal fluid: Secondary | ICD-10-CM | POA: Diagnosis present

## 2013-07-26 DIAGNOSIS — Z765 Malingerer [conscious simulation]: Secondary | ICD-10-CM

## 2013-07-26 DIAGNOSIS — E43 Unspecified severe protein-calorie malnutrition: Secondary | ICD-10-CM

## 2013-07-26 DIAGNOSIS — E2749 Other adrenocortical insufficiency: Secondary | ICD-10-CM | POA: Diagnosis present

## 2013-07-26 DIAGNOSIS — F411 Generalized anxiety disorder: Secondary | ICD-10-CM | POA: Diagnosis present

## 2013-07-26 DIAGNOSIS — Z79899 Other long term (current) drug therapy: Secondary | ICD-10-CM

## 2013-07-26 LAB — COMPREHENSIVE METABOLIC PANEL
ALT: 23 U/L (ref 0–35)
Calcium: 9.8 mg/dL (ref 8.4–10.5)
Creatinine, Ser: 0.71 mg/dL (ref 0.50–1.10)
GFR calc Af Amer: >90 mL/min (ref >90)
Glucose, Bld: 89 mg/dL (ref 70–99)
Sodium: 134 mEq/L — ABNORMAL LOW (ref 135–145)
Total Protein: 9 g/dL — ABNORMAL HIGH (ref 6.0–8.3)

## 2013-07-26 LAB — POCT I-STAT, CHEM 8
BUN: 7 mg/dL (ref 6–23)
Calcium, Ion: 1.22 mmol/L (ref 1.12–1.23)
Chloride: 104 mEq/L (ref 96–112)
Creatinine, Ser: 0.8 mg/dL (ref 0.50–1.10)
Glucose, Bld: 91 mg/dL (ref 70–99)
HCT: 42 % (ref 36.0–46.0)
Hemoglobin: 14.3 g/dL (ref 12.0–15.0)
Potassium: 4.1 mEq/L (ref 3.5–5.1)
Sodium: 142 mEq/L (ref 135–145)
TCO2: 24 mmol/L (ref 0–100)

## 2013-07-26 LAB — CBC WITH DIFFERENTIAL/PLATELET
Basophils Absolute: 0.1 10*3/uL (ref 0.0–0.1)
Eosinophils Absolute: 0.1 10*3/uL (ref 0.0–0.7)
Eosinophils Relative: 1 % (ref 0–5)
MCH: 28.7 pg (ref 26.0–34.0)
MCV: 85.2 fL (ref 78.0–100.0)
Platelets: 303 10*3/uL (ref 150–400)
RDW: 13.7 % (ref 11.5–15.5)

## 2013-07-26 LAB — URINALYSIS, ROUTINE W REFLEX MICROSCOPIC
Hgb urine dipstick: NEGATIVE
Leukocytes, UA: NEGATIVE
Nitrite: NEGATIVE
Specific Gravity, Urine: 1.027 (ref 1.005–1.030)
Urobilinogen, UA: 0.2 mg/dL (ref 0.0–1.0)

## 2013-07-26 LAB — POCT PREGNANCY, URINE: Preg Test, Ur: NEGATIVE

## 2013-07-26 MED ORDER — ONDANSETRON HCL 4 MG PO TABS: 4.0000 mg | ORAL_TABLET | Freq: Four times a day (QID) | ORAL | Status: DC | PRN

## 2013-07-26 MED ORDER — IOHEXOL 300 MG/ML  SOLN
50.0000 mL | Freq: Once | INTRAMUSCULAR | Status: AC | PRN
  Administered 2013-07-26: 50 mL via ORAL

## 2013-07-26 MED ORDER — PROMETHAZINE HCL 25 MG/ML IJ SOLN
25.0000 mg | Freq: Once | INTRAMUSCULAR | Status: AC
  Administered 2013-07-26: 25 mg via INTRAVENOUS
  Filled 2013-07-26: qty 1

## 2013-07-26 MED ORDER — ONDANSETRON HCL 4 MG/2ML IJ SOLN
4.0000 mg | Freq: Once | INTRAMUSCULAR | Status: AC
  Administered 2013-07-26: 4 mg via INTRAVENOUS
  Filled 2013-07-26: qty 2

## 2013-07-26 MED ORDER — HYDROMORPHONE HCL PF 1 MG/ML IJ SOLN
1.0000 mg | Freq: Once | INTRAMUSCULAR | Status: AC
  Administered 2013-07-26: 1 mg via INTRAVENOUS
  Filled 2013-07-26: qty 1

## 2013-07-26 MED ORDER — KCL IN DEXTROSE-NACL 20-5-0.9 MEQ/L-%-% IV SOLN
INTRAVENOUS | Status: DC
  Administered 2013-07-27 – 2013-07-30 (×9): via INTRAVENOUS
  Filled 2013-07-26 (×20): qty 1000

## 2013-07-26 MED ORDER — DIPHENHYDRAMINE HCL 50 MG/ML IJ SOLN
25.0000 mg | Freq: Once | INTRAMUSCULAR | Status: AC
  Administered 2013-07-26: 25 mg via INTRAVENOUS
  Filled 2013-07-26: qty 1

## 2013-07-26 MED ORDER — ONDANSETRON HCL 4 MG/2ML IJ SOLN
4.0000 mg | Freq: Four times a day (QID) | INTRAMUSCULAR | Status: DC | PRN
  Administered 2013-07-27 – 2013-07-30 (×9): 4 mg via INTRAVENOUS
  Filled 2013-07-26 (×9): qty 2

## 2013-07-26 MED ORDER — HYDROMORPHONE HCL PF 1 MG/ML IJ SOLN
1.0000 mg | INTRAMUSCULAR | Status: DC | PRN
  Administered 2013-07-27 – 2013-07-29 (×22): 1 mg via INTRAVENOUS
  Filled 2013-07-26: qty 1
  Filled 2013-07-26: qty 2
  Filled 2013-07-26 (×21): qty 1

## 2013-07-26 MED ORDER — IOHEXOL 300 MG/ML  SOLN
100.0000 mL | Freq: Once | INTRAMUSCULAR | Status: AC | PRN
  Administered 2013-07-26: 100 mL via INTRAVENOUS

## 2013-07-26 MED ORDER — HYDROMORPHONE HCL PF 1 MG/ML IJ SOLN: 1.0000 mg | INTRAMUSCULAR | Status: DC | PRN

## 2013-07-26 MED ORDER — DIPHENHYDRAMINE HCL 50 MG/ML IJ SOLN
25.0000 mg | Freq: Four times a day (QID) | INTRAMUSCULAR | Status: DC | PRN
  Administered 2013-07-27 – 2013-07-29 (×10): 25 mg via INTRAVENOUS
  Filled 2013-07-26 (×10): qty 1

## 2013-07-26 NOTE — H&P (Signed)
[facility] History and Physical  Calyn Sivils Georg ZOX:[id number] DOB: Oct 16, 1975    PCP:   None.  Chief Complaint: abdominal pain, nausea and vomiting for one day.  HPI: Mary [name] is an 37 y.o. female with hx of chronic pancreatitis, pancreatic cyst, anxiety, depression, ? Adrenal insufficiency, with hx of recurrent abdominal pain, presents to the ER with diffuse abdominal pain, nausea, and vomiting started this am.  She has no ill contact or distant travel.  She moved her bowel today. There has been no fever, chills, black or bloody stools.  She denied any alcohol or drug usage.  Evaluation in the ER showed normal lipase, normal WBC, Hb, renal fx, LFTs, and electrolytes.  Her abdominal CT showed pancreatic cyst, but no acute processes. She was given several doses of dilaudid and benadryl, and hospitalist was asked to admit her for symptomatic tx of her chronic intermittent abdominal pain.  Rewiew of Systems:  Constitutional: Negative for malaise, fever and chills. No significant weight loss or weight gain Eyes: Negative for eye pain, redness and discharge, diplopia, visual changes, or flashes of light. ENMT: Negative for ear pain, hoarseness, nasal congestion, sinus pressure and sore throat. No headaches; tinnitus, drooling, or problem swallowing. Cardiovascular: Negative for chest pain, palpitations, diaphoresis, dyspnea and peripheral edema. ; No orthopnea, PND Respiratory: Negative for cough, hemoptysis, wheezing and stridor. No pleuritic chestpain. Gastrointestinal: Negative for  melena, blood in stool, hematemesis, jaundice and rectal bleeding.    Genitourinary: Negative for frequency, dysuria, incontinence,flank pain and hematuria; Musculoskeletal: Negative for back pain and neck pain. Negative for swelling and trauma.;  Skin: . Negative for pruritus, rash, abrasions, bruising and skin lesion.; ulcerations Neuro: Negative for headache, lightheadedness and neck stiffness.  Negative for weakness, altered level of consciousness , altered mental status, extremity weakness, burning feet, involuntary movement, seizure and syncope.  Psych: negative for  insomnia, tearfulness, panic attacks, hallucinations, paranoia, suicidal or homicidal ideation    Past Medical History  Diagnosis Date  . Hernia 03-24-13    ventral hernia remains   . GERD (gastroesophageal reflux disease)   . Pancreatitis 03-24-13    2002, 2'2013, 03-14-13  . Pancreatic cyst 2002 onset  . Anxiety   . Depression     "recent breakup with partner of 15 yrs"  . Adrenal insufficiency 04/23/2013    ??    Past Surgical History  Procedure Laterality Date  . Abdominal surgery  ~ 2007    some sort of pancreatic cyst drainage.   . Cholecystectomy      ~ age 65-laparoscopic  . Adenoidectomy    . Abdominal surgery      stent to pancreatic cyst that became infected within 36 hours  . Eus N/A 04/14/2013    Procedure: UPPER ENDOSCOPIC ULTRASOUND (EUS) LINEAR;  Surgeon: Rachael Fee, MD;  Location: [facility];  Service: Endoscopy;  Laterality: N/A;    Medications:  HOME MEDS: Prior to Admission medications   Medication Sig Start Date End Date Taking? Authorizing Provider  Pancrelipase, Lip-Prot-Amyl, 3000-9500 UNITS CPEP Take 2 capsules by mouth 3 (three) times daily before meals. 03/23/13  Yes Tora Kindred York, PA-C  promethazine (PHENERGAN) 25 MG tablet Take 12.5 mg by mouth every 6 (six) hours as needed for nausea or vomiting.   Yes Historical Provider, MD     Allergies:  Allergies  Allergen Reactions  . Penicillins Anaphylaxis  . Latex Itching  . Morphine And Related Nausea And Vomiting    Social History:  reports that she quit smoking about 2 months ago. Her smoking use included Cigarettes. She has a 10 pack-year smoking history. She has never used smokeless tobacco. She reports that she does not drink alcohol or use illicit drugs.  Family History: Family History  Problem Relation Age  of Onset  . Lupus Neg Hx   . Sarcoidosis Neg Hx   . Pancreatitis Neg Hx   . Colitis Maternal Aunt   . Diabetes Mother   . Diabetes Father   . Diabetes      many family members     Physical Exam: Filed Vitals:   07/26/13 1629 07/26/13 1728  BP: 117/80 112/73  Pulse: 100 95  Temp: 98.3 F (36.8 C) 98.2 F (36.8 C)  TempSrc: Oral Oral  Resp: 24 16  SpO2: 99% 98%   Blood pressure 112/73, pulse 95, temperature 98.2 F (36.8 C), temperature source Oral, resp. rate 16, last menstrual period 07/14/2013, SpO2 98.00%.  GEN:  Pleasant patient lying in the stretcher in no acute distress; cooperative with exam. PSYCH:  alert and oriented x4; does not appear anxious or depressed; affect is appropriate. HEENT: Mucous membranes pink and anicteric; PERRLA; EOM intact; no cervical lymphadenopathy nor thyromegaly or carotid bruit; no JVD; There were no stridor. Neck is very supple. Breasts:: Not examined CHEST WALL: No tenderness CHEST: Normal respiration, clear to auscultation bilaterally.  HEART: Regular rate and rhythm.  There are no murmur, rub, or gallops.   BACK: No kyphosis or scoliosis; no CVA tenderness ABDOMEN: soft, slightly tender diffusely.  no masses, no organomegaly, normal abdominal bowel sounds; no pannus; no intertriginous candida. There is no rebound and no distention. Rectal Exam: Not done EXTREMITIES: No bone or joint deformity; age-appropriate arthropathy of the hands and knees; no edema; no ulcerations.  There is no calf tenderness. Genitalia: not examined PULSES: 2+ and symmetric SKIN: Normal hydration no rash or ulceration CNS: Cranial nerves 2-12 grossly intact no focal lateralizing neurologic deficit.  Speech is fluent; uvula elevated with phonation, facial symmetry and tongue midline. DTR are normal bilaterally, cerebella exam is intact, barbinski is negative and strengths are equaled bilaterally.  No sensory loss.   Labs on Admission:  Basic Metabolic  Panel:  Recent Labs Lab 07/26/13 1720 07/26/13 1732  NA 134* 142  K 3.9 4.1  CL 99 104  CO2 23  --   GLUCOSE 89 91  BUN 9 7  CREATININE 0.71 0.80  CALCIUM 9.8  --    Liver Function Tests:  Recent Labs Lab 07/26/13 1720  AST 21  ALT 23  ALKPHOS 99  BILITOT 0.2*  PROT 9.0*  ALBUMIN 4.3    Recent Labs Lab 07/26/13 1720  LIPASE 16   No results found for this basename: AMMONIA,  in the last 168 hours CBC:  Recent Labs Lab 07/26/13 1720 07/26/13 1732  WBC 8.2  --   NEUTROABS 5.3  --   HGB 12.2 14.3  HCT 36.2 42.0  MCV 85.2  --   PLT 303  --    Cardiac Enzymes: No results found for this basename: CKTOTAL, CKMB, CKMBINDEX, TROPONINI,  in the last 168 hours  CBG: No results found for this basename: GLUCAP,  in the last 168 hours   Radiological Exams on Admission: Dg Lumbar Spine Complete  07/26/2013   CLINICAL DATA:  Low back pain.  Pain radiates down both legs.  EXAM: LUMBAR SPINE - COMPLETE 4+ VIEW  COMPARISON:  CT abdomen pelvis 04/19/2013. Abdominal radiographs 03/20/2013.  FINDINGS: There is no evidence of acute lumbar spine fracture or listhesis. Vertebral body heights are preserved. Bridging anterior osteophytes are present at T10-11 and T11-12. Disc spaces are maintained in the lumbar spine. Surgical clips are noted in the right upper quadrant of the abdomen.  IMPRESSION: No evidence of acute osseous abnormality or significant degenerative change in the lumbar spine.   Electronically Signed   By: Sebastian Ache   On: 07/26/2013 18:36   Ct Abdomen Pelvis W Contrast  07/26/2013   CLINICAL DATA:  Mid abdominal pain, history of pancreatitis  EXAM: CT ABDOMEN AND PELVIS WITH CONTRAST  TECHNIQUE: Multidetector CT imaging of the abdomen and pelvis was performed using the standard protocol following bolus administration of intravenous contrast.  CONTRAST:  50mL OMNIPAQUE IOHEXOL 300 MG/ML SOLN, OMNIPAQUE IOHEXOL 300 MG/ML SOLN  COMPARISON:  05/07/2013  FINDINGS:  Lung bases are unremarkable. Sagittal images of the spine shows degenerative changes lower thoracic spine. Liver, spleen and right adrenal are unremarkable. Left adrenal at the adenoma is stable in size in appearance. Multilobulated cystic lesion at the junction of pancreatic body with the tail measures 4.5 x 3.9 cm. On the prior exam measures 4.4 x 3.7 cm. There is no evidence of pancreatitis.  Kidneys are symmetrical in size and enhancement. No hydronephrosis or hydroureter. There is a midline upper ventral hernia containing fat measures 3 x 4.5 cm. There is a 2nd left paramedial ventral hernia containing fat measures 3.1 by 2 cm. There is no evidence of acute complication. There is a 3rd midline ventral hernia containing fat axial image 42 measures 3.5 by 6.3 cm. There is no evidence of acute complication.  No small bowel obstruction. No ascites or free air. No adenopathy. There is no pericecal inflammation. The terminal ileum is unremarkable. Normal appendix is partially visualized in coronal image 70.  The urinary bladder is unremarkable. Uterus and adnexa are unremarkable.  IMPRESSION: 1. Again noted lobulated cystic lesion within pancreatic body measures 3.9 x 4.5 cm. On the prior exam measures 4.4 x 3.7 cm. There is no evidence of acute pancreatitis. 2. 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 3. No pericecal inflammation.  Normal appendix. 4. No small bowel obstruction. 5. At least 3 upper abdominal wall ventral hernias containing fat without evidence of acute complication.   Electronically Signed   By: Natasha Mead M.D.   On: 07/26/2013 20:32   Assessment/Plan Present on Admission:  . Abdominal pain . Nausea and vomiting in adult . history of recurrent ideopathic pancreatitis . Chronic pancreatitis with chronic pseudocysts  PLAN: Will admit her for chronic intermittent abdominal pain for symptomatic relief.  I don't think the pancreatic cyst is causing her problem, as she was able to go  pain free for over a year at times.  Will give IVF, bowel rest, IV Dilaudid, and antiemetics.  She is stable, full code, and will be admitted to [facility] service.  Thank you for asking me to help care for this nice patient.  Other plans as per orders.  Code Status: FULL Unk Lightning, MD. [facility] Pager [phone] 7pm to 7am.  07/26/2013, 10:10 PM

## 2013-07-26 NOTE — ED Provider Notes (Signed)
CSN: [id number]     Arrival date & time 07/26/13  1604 History   First MD Initiated Contact with Patient 07/26/13 1632     Chief Complaint  Patient presents with  . Abdominal Pain  . Back Pain  . Leg Pain   (Consider location/radiation/quality/duration/timing/severity/associated sxs/prior Treatment) HPI Comments: Pt is a 37 y/o obese female with a PMHx of pancreatitis, ventral hernia, GERD, abdominal surgery consisting of pancreatic cyst drainage, cholecystectomy and adrenal insufficiency who presents to the ED complaining of sudden onset mid-abdominal pain radiating to her back after eating around 12:00 pm today. Pain described as sharp and severe, similar to her prior pancreatic flare ups, worse with movement with associated nausea and vomiting. She has been unable to keep anything down since, tried taking phenergan however she vomited that back up. She has had a normal appetite over the past few days, does not drink alcohol. Denies fever, bowel changes, urinary complaints, vaginal discharge or bleeding. Also complaining of bilateral leg pain when she woke up this morning, pain in her calves described as cramping and as if rubber bands were squeezing her, worse with walking, left worse than right. States this feels similar to back over the summer when she had low potassium levels. Denies swelling, numbness, chest pain or sob. Quit smoking 2 months ago, does not take OCP, no hx of DVT.  Patient is a 37 y.o. female presenting with abdominal pain, back pain, and leg pain. The history is provided by the patient.  Abdominal Pain Associated symptoms: nausea and vomiting   Back Pain Associated symptoms: abdominal pain and leg pain   Leg Pain Associated symptoms: back pain     Past Medical History  Diagnosis Date  . Hernia 03-24-13    ventral hernia remains   . GERD (gastroesophageal reflux disease)   . Pancreatitis 03-24-13    2002, 2'2013, 03-14-13  . Pancreatic cyst 2002 onset  . Anxiety   .  Depression     "recent breakup with partner of 15 yrs"  . Adrenal insufficiency 04/23/2013    ??   Past Surgical History  Procedure Laterality Date  . Abdominal surgery  ~ 2007    some sort of pancreatic cyst drainage.   . Cholecystectomy      ~ age 69-laparoscopic  . Adenoidectomy    . Abdominal surgery      stent to pancreatic cyst that became infected within 36 hours  . Eus N/A 04/14/2013    Procedure: UPPER ENDOSCOPIC ULTRASOUND (EUS) LINEAR;  Surgeon: Rachael Fee, MD;  Location: [facility];  Service: Endoscopy;  Laterality: N/A;   Family History  Problem Relation Age of Onset  . Lupus Neg Hx   . Sarcoidosis Neg Hx   . Pancreatitis Neg Hx   . Colitis Maternal Aunt   . Diabetes Mother   . Diabetes Father   . Diabetes      many family members   History  Substance Use Topics  . Smoking status: Former Smoker -- 0.50 packs/day for 20 years    Types: Cigarettes    Quit date: 05/26/2013  . Smokeless tobacco: Never Used  . Alcohol Use: No   OB History   Grav Para Term Preterm Abortions TAB SAB Ect Mult Living                 Review of Systems  Gastrointestinal: Positive for nausea, vomiting and abdominal pain.  Musculoskeletal: Positive for back pain and myalgias (bilateral leg pain).  Psychiatric/Behavioral:  The patient is nervous/anxious.   All other systems reviewed and are negative.    Allergies  Penicillins; Latex; and Morphine and related  Home Medications   Current Outpatient Rx  Name  Route  Sig  Dispense  Refill  . Pancrelipase, Lip-Prot-Amyl, [id number] UNITS CPEP   Oral   Take 2 capsules by mouth 3 (three) times daily before meals.   200 capsule   3   . promethazine (PHENERGAN) 25 MG tablet   Oral   Take 12.5 mg by mouth every 6 (six) hours as needed for nausea or vomiting.          BP 117/80  Pulse 100  Temp(Src) 98.3 F (36.8 C) (Oral)  Resp 24  SpO2 99%  LMP 07/14/2013 Physical Exam  Nursing note and vitals  reviewed. Constitutional: She is oriented to person, place, and time. She appears well-developed and well-nourished. No distress.  Obese, appears uncomfortable.  HENT:  Head: Normocephalic and atraumatic.  Mouth/Throat: Oropharynx is clear and moist.  Eyes: Conjunctivae and EOM are normal. Pupils are equal, round, and reactive to light. No scleral icterus.  Neck: Normal range of motion. Neck supple.  Cardiovascular: Normal rate, regular rhythm, normal heart sounds and intact distal pulses.   Pulmonary/Chest: Effort normal and breath sounds normal.  Abdominal: Soft. Bowel sounds are normal. She exhibits no distension. There is tenderness in the epigastric area and periumbilical area. There is guarding. There is no rigidity and no rebound.  No peritoneal signs.  Musculoskeletal: Normal range of motion. She exhibits no edema.  Generalized tenderness of bilateral calves without erythema, swelling. TTP of lower lumbar spine and paralumbar muscles.  Neurological: She is alert and oriented to person, place, and time. She has normal strength. No sensory deficit.  Pain to lower back when assessing strength.  Skin: Skin is warm and dry. She is not diaphoretic.  Psychiatric: She has a normal mood and affect. Her behavior is normal.    ED Course  Procedures (including critical care time) Labs Review Labs Reviewed  COMPREHENSIVE METABOLIC PANEL - Abnormal; Notable for the following:    Sodium 134 (*)    Total Protein 9.0 (*)    Total Bilirubin 0.2 (*)    All other components within normal limits  CBC WITH DIFFERENTIAL  LIPASE, BLOOD  URINALYSIS, ROUTINE W REFLEX MICROSCOPIC  POCT I-STAT, CHEM 8  POCT PREGNANCY, URINE   Imaging Review Dg Lumbar Spine Complete  07/26/2013   CLINICAL DATA:  Low back pain.  Pain radiates down both legs.  EXAM: LUMBAR SPINE - COMPLETE 4+ VIEW  COMPARISON:  CT abdomen pelvis 04/19/2013. Abdominal radiographs 03/20/2013.  FINDINGS: There is no evidence of acute  lumbar spine fracture or listhesis. Vertebral body heights are preserved. Bridging anterior osteophytes are present at T10-11 and T11-12. Disc spaces are maintained in the lumbar spine. Surgical clips are noted in the right upper quadrant of the abdomen.  IMPRESSION: No evidence of acute osseous abnormality or significant degenerative change in the lumbar spine.   Electronically Signed   By: Sebastian Ache   On: 07/26/2013 18:36   Ct Abdomen Pelvis W Contrast  07/26/2013   CLINICAL DATA:  Mid abdominal pain, history of pancreatitis  EXAM: CT ABDOMEN AND PELVIS WITH CONTRAST  TECHNIQUE: Multidetector CT imaging of the abdomen and pelvis was performed using the standard protocol following bolus administration of intravenous contrast.  CONTRAST:  50mL OMNIPAQUE IOHEXOL 300 MG/ML SOLN, OMNIPAQUE IOHEXOL 300 MG/ML SOLN  COMPARISON:  05/07/2013  FINDINGS: Lung bases are unremarkable. Sagittal images of the spine shows degenerative changes lower thoracic spine. Liver, spleen and right adrenal are unremarkable. Left adrenal at the adenoma is stable in size in appearance. Multilobulated cystic lesion at the junction of pancreatic body with the tail measures 4.5 x 3.9 cm. On the prior exam measures 4.4 x 3.7 cm. There is no evidence of pancreatitis.  Kidneys are symmetrical in size and enhancement. No hydronephrosis or hydroureter. There is a midline upper ventral hernia containing fat measures 3 x 4.5 cm. There is a 2nd left paramedial ventral hernia containing fat measures 3.1 by 2 cm. There is no evidence of acute complication. There is a 3rd midline ventral hernia containing fat axial image 42 measures 3.5 by 6.3 cm. There is no evidence of acute complication.  No small bowel obstruction. No ascites or free air. No adenopathy. There is no pericecal inflammation. The terminal ileum is unremarkable. Normal appendix is partially visualized in coronal image 70.  The urinary bladder is unremarkable. Uterus and adnexa  are unremarkable.  IMPRESSION: 1. Again noted lobulated cystic lesion within pancreatic body measures 3.9 x 4.5 cm. On the prior exam measures 4.4 x 3.7 cm. There is no evidence of acute pancreatitis. 2. 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 3. No pericecal inflammation.  Normal appendix. 4. No small bowel obstruction. 5. At least 3 upper abdominal wall ventral hernias containing fat without evidence of acute complication.   Electronically Signed   By: Natasha Mead M.D.   On: 07/26/2013 20:32    EKG Interpretation   None       MDM   1. Abdominal pain   2. Chronic pancreatitis   3. Nausea and vomiting in adult     Pt presenting with abdominal pain, n/v, hx of pancreatitis and abdominal surgeries. She appears uncomfortable but in NAD. Vital signs stable. Probable pancreatitis flare, last admitted 8/28 and states this feels similar. Last CT scan on 9/2. Leg cramping possibly from hypokalemia. Low suspicion for DVT, Wells criteria 1 point in low risk group. Labs pending- cbc, cmp, lipase, ua. Pain control, zofran. 5:58 PM K 4.1. Lumbar spine xray pending. 7:24 PM Xray normal. Labs normal. UA clear. Abdominal pain, back pain, myalgias could be from hx of adrenal insufficiency. She is not on steroids. Pain slightly improved, however she still appears uncomfortable, abdomen still very tender. Will obtain CT w contrast. Case discussed with attending Dr. Juleen China who agrees with plan of care. 11:10 PM CT scan not showing any acute changes. Pt still in pain, controlled for a short period of time and returns with associated n/v. Abdomen still tender. Pt also evaluated by Dr. Juleen China. Will consult for admission for pain control, n/v. Admission accepted by Dr. Conley Rolls, [facility].  Trevor Mace, PA-C 07/26/13 2311

## 2013-07-26 NOTE — ED Notes (Signed)
Pt from home c/o abd pain that radiates to back pain that started today. Pt has hx of pancreatitis, took 1 12.5 Phenergan PTA with no relief. Pt also c/o bilateral leg pain. Pt describes aching that feels like "rubber bands around my legs." Pt adds that she has been vomiting today. Pt is A&O and in NAD

## 2013-07-27 DIAGNOSIS — K862 Cyst of pancreas: Secondary | ICD-10-CM

## 2013-07-27 LAB — TSH: TSH: 4.794 u[IU]/mL — ABNORMAL HIGH (ref 0.350–4.500)

## 2013-07-27 LAB — RAPID URINE DRUG SCREEN, HOSP PERFORMED
Barbiturates: NOT DETECTED
Cocaine: NOT DETECTED
Opiates: POSITIVE — AB

## 2013-07-27 NOTE — Progress Notes (Signed)
[facility] PROGRESS NOTE  Mary [name] FAO:[id number] DOB: [birth year] DOA: 07/26/2013 PCP: No primary provider on file.  Assessment/Plan: Intractable abdominal pain with recurrent vomiting -Etiology is unclear??exacerbation of chronic pancreatitis -No epidemiologic history to suggest exotic etiology  -Check urine drug screen  -The patient has had extensive workup in the past from GI and general surgery  -Labs are reassuring--lipase 16 -?nonspecific gastritis -CT abdomen negative for acute findings -Treated conservatively with IV fluids, opioids, antiemetics - if no improvement, may need GI consultation  -Patient denies any NSAID or opioid use a home chronic pancreatitis/pancreatic cyst -Doubt pancreatic cyst as etiology at this time  -Previous biopsy of pancreatic cyst negative for malignancy  -May be underlying functional/psychosocial issues contributing -Follows Dr. Rise Mu at [facility] seen 06/14/13 Left adrenal nodule  -Outpatient followup  Ventral hernia  -No sign of incarceration per CT of the abdomen and pelvis   history of tobacco use  -Has quit for 2 months   Family Communication:   Pt at beside Disposition Plan:   Home when medically stable       Procedures/Studies: Dg Lumbar Spine Complete  07/26/2013   CLINICAL DATA:  Low back pain.  Pain radiates down both legs.  EXAM: LUMBAR SPINE - COMPLETE 4+ VIEW  COMPARISON:  CT abdomen pelvis 04/19/2013. Abdominal radiographs 03/20/2013.  FINDINGS: There is no evidence of acute lumbar spine fracture or listhesis. Vertebral body heights are preserved. Bridging anterior osteophytes are present at T10-11 and T11-12. Disc spaces are maintained in the lumbar spine. Surgical clips are noted in the right upper quadrant of the abdomen.  IMPRESSION: No evidence of acute osseous abnormality or significant degenerative change in the lumbar spine.   Electronically Signed   By: Sebastian Ache   On: 07/26/2013 18:36    Ct Abdomen Pelvis W Contrast  07/26/2013   CLINICAL DATA:  Mid abdominal pain, history of pancreatitis  EXAM: CT ABDOMEN AND PELVIS WITH CONTRAST  TECHNIQUE: Multidetector CT imaging of the abdomen and pelvis was performed using the standard protocol following bolus administration of intravenous contrast.  CONTRAST:  50mL OMNIPAQUE IOHEXOL 300 MG/ML SOLN, OMNIPAQUE IOHEXOL 300 MG/ML SOLN  COMPARISON:  05/07/2013  FINDINGS: Lung bases are unremarkable. Sagittal images of the spine shows degenerative changes lower thoracic spine. Liver, spleen and right adrenal are unremarkable. Left adrenal at the adenoma is stable in size in appearance. Multilobulated cystic lesion at the junction of pancreatic body with the tail measures 4.5 x 3.9 cm. On the prior exam measures 4.4 x 3.7 cm. There is no evidence of pancreatitis.  Kidneys are symmetrical in size and enhancement. No hydronephrosis or hydroureter. There is a midline upper ventral hernia containing fat measures 3 x 4.5 cm. There is a 2nd left paramedial ventral hernia containing fat measures 3.1 by 2 cm. There is no evidence of acute complication. There is a 3rd midline ventral hernia containing fat axial image 42 measures 3.5 by 6.3 cm. There is no evidence of acute complication.  No small bowel obstruction. No ascites or free air. No adenopathy. There is no pericecal inflammation. The terminal ileum is unremarkable. Normal appendix is partially visualized in coronal image 70.  The urinary bladder is unremarkable. Uterus and adnexa are unremarkable.  IMPRESSION: 1. Again noted lobulated cystic lesion within pancreatic body measures 3.9 x 4.5 cm. On the prior exam measures 4.4 x 3.7 cm. There is no evidence of acute pancreatitis. 2. 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 3. No  pericecal inflammation.  Normal appendix. 4. No small bowel obstruction. 5. At least 3 upper abdominal wall ventral hernias containing fat without evidence of acute  complication.   Electronically Signed   By: Natasha Mead M.D.   On: 07/26/2013 20:32         Subjective:  patient states that on abdominal pain is low but better. Denies any fevers, chills, chest discomfort, shortness breath, diarrhea, hematochezia, melena. No dysuria or hematuria. Had 4 episodes of emesis yesterday, one episode last night. No hematemesis.   Objective: Filed Vitals:   07/26/13 1629 07/26/13 1728 07/26/13 2318 07/27/13 0300  BP: 117/80 112/73 111/74 101/67  Pulse: 100 95 84 68  Temp: 98.3 F (36.8 C) 98.2 F (36.8 C) 97.5 F (36.4 C) 98 F (36.7 C)  TempSrc: Oral Oral Oral Oral  Resp: 24 16  16   Height:   5\' 4"  (1.626 m)   Weight:   101.878 kg (224 lb 9.6 oz)   SpO2: 99% 98% 98% 97%    Intake/Output Summary (Last 24 hours) at 07/27/13 0850 Last data filed at 07/27/13 0824  Gross per 24 hour  Intake 593.75 ml  Output      0 ml  Net 593.75 ml   Weight change:  Exam:   General:  Pt is alert, follows commands appropriately, not in acute distress  HEENT: No icterus, No thrush,[place]/AT  Cardiovascular: RRR, S1/S2, no rubs, no gallops  Respiratory: CTA bilaterally, no wheezing, no crackles, no rhonchi  Abdomen: Soft/+BS, epigastric/periumbilical tenderness without any peritoneal signs or rebound tenderness, non distended, no guarding  Extremities: No edema, No lymphangitis, No petechiae, No rashes, no synovitis  Data Reviewed: Basic Metabolic Panel:  Recent Labs Lab 07/26/13 1720 07/26/13 1732  NA 134* 142  K 3.9 4.1  CL 99 104  CO2 23  --   GLUCOSE 89 91  BUN 9 7  CREATININE 0.71 0.80  CALCIUM 9.8  --    Liver Function Tests:  Recent Labs Lab 07/26/13 1720  AST 21  ALT 23  ALKPHOS 99  BILITOT 0.2*  PROT 9.0*  ALBUMIN 4.3    Recent Labs Lab 07/26/13 1720  LIPASE 16   No results found for this basename: AMMONIA,  in the last 168 hours CBC:  Recent Labs Lab 07/26/13 1720 07/26/13 1732  WBC 8.2  --   NEUTROABS 5.3  --    HGB 12.2 14.3  HCT 36.2 42.0  MCV 85.2  --   PLT 303  --    Cardiac Enzymes: No results found for this basename: CKTOTAL, CKMB, CKMBINDEX, TROPONINI,  in the last 168 hours BNP: No components found with this basename: POCBNP,  CBG: No results found for this basename: GLUCAP,  in the last 168 hours  No results found for this or any previous visit (from the past 240 hour(s)).   Scheduled Meds:  Continuous Infusions: . dextrose 5 % and 0.9 % NaCl with KCl 20 mEq/L 125 mL/hr at 07/27/13 0757     Zoila Ditullio, DO  [facility] Pager [phone]  If 7PM-7AM, please contact night-coverage [email or web address] Password [email or web address] 07/27/2013, 8:50 AM   LOS: 1 day

## 2013-07-27 NOTE — Progress Notes (Signed)
Utilization review completed.  

## 2013-07-28 DIAGNOSIS — R1115 Cyclical vomiting syndrome unrelated to migraine: Secondary | ICD-10-CM

## 2013-07-28 LAB — CBC
HCT: 33.4 % — ABNORMAL LOW (ref 36.0–46.0)
MCH: 29.2 pg (ref 26.0–34.0)
MCHC: 33.2 g/dL (ref 30.0–36.0)
MCV: 87.9 fL (ref 78.0–100.0)
Platelets: 271 10*3/uL (ref 150–400)
RDW: 14 % (ref 11.5–15.5)

## 2013-07-28 LAB — COMPREHENSIVE METABOLIC PANEL
AST: 23 U/L (ref 0–37)
Albumin: 3.3 g/dL — ABNORMAL LOW (ref 3.5–5.2)
BUN: 7 mg/dL (ref 6–23)
CO2: 22 mEq/L (ref 19–32)
Calcium: 8.7 mg/dL (ref 8.4–10.5)
Chloride: 106 mEq/L (ref 96–112)
Creatinine, Ser: 0.66 mg/dL (ref 0.50–1.10)
GFR calc non Af Amer: >90 mL/min (ref >90)
Total Bilirubin: 0.3 mg/dL (ref 0.3–1.2)

## 2013-07-28 LAB — T4, FREE: Free T4: 1.25 ng/dL (ref 0.80–1.80)

## 2013-07-28 LAB — MAGNESIUM: Magnesium: 1.9 mg/dL (ref 1.5–2.5)

## 2013-07-28 MED ORDER — PANTOPRAZOLE SODIUM 40 MG IV SOLR
40.0000 mg | Freq: Once | INTRAVENOUS | Status: AC
  Administered 2013-07-28: 40 mg via INTRAVENOUS
  Filled 2013-07-28: qty 40

## 2013-07-28 NOTE — Progress Notes (Signed)
[facility] PROGRESS NOTE  Mary [name] ZOX:[id number] DOB: 12-Feb-1976 DOA: 07/26/2013 PCP: No primary provider on file.  Assessment/Plan: Intractable abdominal pain with recurrent vomiting  -Etiology is unclear??exacerbation of chronic pancreatitis  -No epidemiologic history to suggest exotic etiology  -Check urine drug screen--positive only for opiates -The patient has had extensive workup in the past from GI and general surgery  -Vomiting has improved, but continues to have abdominal pain requiring frequent hydromorphone dosing -Consult gastroenterology for opinion -Labs are reassuring--lipase 16  -?nonspecific gastritis  -CT abdomen negative for acute findings  -Treated conservatively with IV fluids, opioids, antiemetics  - if no improvement, may need GI consultation  -Patient denies any NSAID or opioid use a home  -Previous Cortrosyn stimulation test negative for primary adrenal insufficiency chronic pancreatitis/pancreatic cyst  -Doubt pancreatic cyst as etiology at this time  -Previous biopsy of pancreatic cyst negative for malignancy  -May be underlying functional/psychosocial issues contributing  -Follow[facility] Rise Mu at [facility] seen 06/14/13--recommended tobacco cessation x6 months before proceeding with any surgical management  Left adrenal nodule  -Outpatient followup  Ventral hernia  -No sign of incarceration per CT of the abdomen and pelvis  history of tobacco use  -Has quit for 2 months  Elevated TSH -Check free T4 Family Communication: none today Disposition Plan: Home when medically stable          Procedures/Studies: Dg Lumbar Spine Complete  07/26/2013   CLINICAL DATA:  Low back pain.  Pain radiates down both legs.  EXAM: LUMBAR SPINE - COMPLETE 4+ VIEW  COMPARISON:  CT abdomen pelvis 04/19/2013. Abdominal radiographs 03/20/2013.  FINDINGS: There is no evidence of acute lumbar spine fracture or listhesis. Vertebral body heights are  preserved. Bridging anterior osteophytes are present at T10-11 and T11-12. Disc spaces are maintained in the lumbar spine. Surgical clips are noted in the right upper quadrant of the abdomen.  IMPRESSION: No evidence of acute osseous abnormality or significant degenerative change in the lumbar spine.   Electronically Signed   By: Sebastian Ache   On: 07/26/2013 18:36   Ct Abdomen Pelvis W Contrast  07/26/2013   CLINICAL DATA:  Mid abdominal pain, history of pancreatitis  EXAM: CT ABDOMEN AND PELVIS WITH CONTRAST  TECHNIQUE: Multidetector CT imaging of the abdomen and pelvis was performed using the standard protocol following bolus administration of intravenous contrast.  CONTRAST:  50mL OMNIPAQUE IOHEXOL 300 MG/ML SOLN, OMNIPAQUE IOHEXOL 300 MG/ML SOLN  COMPARISON:  05/07/2013  FINDINGS: Lung bases are unremarkable. Sagittal images of the spine shows degenerative changes lower thoracic spine. Liver, spleen and right adrenal are unremarkable. Left adrenal at the adenoma is stable in size in appearance. Multilobulated cystic lesion at the junction of pancreatic body with the tail measures 4.5 x 3.9 cm. On the prior exam measures 4.4 x 3.7 cm. There is no evidence of pancreatitis.  Kidneys are symmetrical in size and enhancement. No hydronephrosis or hydroureter. There is a midline upper ventral hernia containing fat measures 3 x 4.5 cm. There is a 2nd left paramedial ventral hernia containing fat measures 3.1 by 2 cm. There is no evidence of acute complication. There is a 3rd midline ventral hernia containing fat axial image 42 measures 3.5 by 6.3 cm. There is no evidence of acute complication.  No small bowel obstruction. No ascites or free air. No adenopathy. There is no pericecal inflammation. The terminal ileum is unremarkable. Normal appendix is partially visualized in coronal image 70.  The urinary bladder is  unremarkable. Uterus and adnexa are unremarkable.  IMPRESSION: 1. Again noted lobulated cystic  lesion within pancreatic body measures 3.9 x 4.5 cm. On the prior exam measures 4.4 x 3.7 cm. There is no evidence of acute pancreatitis. 2. 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 3. No pericecal inflammation.  Normal appendix. 4. No small bowel obstruction. 5. At least 3 upper abdominal wall ventral hernias containing fat without evidence of acute complication.   Electronically Signed   By: Natasha Mead M.D.   On: 07/26/2013 20:32         Subjective: Patient states the vomiting has improved but continues to be nauseous. States abdominal pannus not much changed. Denies fevers, chills, chest pain, shortness breath, dysuria, hematuria, rashes, headache, dizziness.  Objective: Filed Vitals:   07/27/13 0300 07/27/13 1300 07/27/13 2248 07/28/13 0642  BP: 101/67 104/69 94/60 91/58   Pulse: 68 62 63 65  Temp: 98 F (36.7 C) 98.3 F (36.8 C) 97.3 F (36.3 C) 97.4 F (36.3 C)  TempSrc: Oral Oral Oral Oral  Resp: 16 16 16 16   Height:      Weight:      SpO2: 97% 97% 94% 96%    Intake/Output Summary (Last 24 hours) at 07/28/13 0754 Last data filed at 07/28/13 1610  Gross per 24 hour  Intake 2015.42 ml  Output    440 ml  Net 1575.42 ml   Weight change:  Exam:   General:  Pt is alert, follows commands appropriately, not in acute distress  HEENT: No icterus, No thrush, [place]/AT  Cardiovascular: RRR, S1/S2, no rubs, no gallops  Respiratory: CTA bilaterally, no wheezing, no crackles, no rhonchi  Abdomen: Soft/+BS, epigastric tenderness without any rebound or peritoneal sign, non distended, no guarding  Extremities: No edema, No lymphangitis, No petechiae, No rashes, no synovitis  Data Reviewed: Basic Metabolic Panel:  Recent Labs Lab 07/26/13 1720 07/26/13 1732  NA 134* 142  K 3.9 4.1  CL 99 104  CO2 23  --   GLUCOSE 89 91  BUN 9 7  CREATININE 0.71 0.80  CALCIUM 9.8  --    Liver Function Tests:  Recent Labs Lab 07/26/13 1720  AST 21  ALT 23  ALKPHOS 99   BILITOT 0.2*  PROT 9.0*  ALBUMIN 4.3    Recent Labs Lab 07/26/13 1720  LIPASE 16   No results found for this basename: AMMONIA,  in the last 168 hours CBC:  Recent Labs Lab 07/26/13 1720 07/26/13 1732 07/28/13 0505  WBC 8.2  --  5.0  NEUTROABS 5.3  --   --   HGB 12.2 14.3 9.4*  HCT 36.2 42.0 28.5*  MCV 85.2  --  89.1  PLT 303  --  227   Cardiac Enzymes: No results found for this basename: CKTOTAL, CKMB, CKMBINDEX, TROPONINI,  in the last 168 hours BNP: No components found with this basename: POCBNP,  CBG: No results found for this basename: GLUCAP,  in the last 168 hours  No results found for this or any previous visit (from the past 240 hour(s)).   Scheduled Meds:  Continuous Infusions: . dextrose 5 % and 0.9 % NaCl with KCl 20 mEq/L 125 mL/hr at 07/28/13 0023     Mary Kilbride, DO  [facility] Pager [phone]  If 7PM-7AM, please contact night-coverage [email or web address] Password [email or web address] 07/28/2013, 7:54 AM   LOS: 2 days

## 2013-07-28 NOTE — Consult Note (Signed)
Referring Provider: No ref. provider found Primary Care Physician:  No primary provider on file. Primary Gastroenterologist:  None, unassigned  Reason for Consultation:  Abdominal pain, nausea and vomiting  HPI: Mary [name] is a 37 y.o. 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 Her cyst had become somewhat larger recently and she was hospitalized late July into early August with pancreatitis and terminal ileitis with severe diarrhea. She was discharged home to complete a course of cipro/flagyl. She presented for endoscopic ultrasound and biopsy of her pancreatic cyst, which was done by Dr. Christella Hartigan on 8/28. She had 16mL of thick clear fluid removed without complication and it was sent for cytology, amylase/lipase.  She felt well post-procedure, but then presented the next day with epigastric pain and nausea and vomiting, nonbilious/nonbloody.  In the ER she had XR abd which demonstrated a single mildly prominent small bowel loop in the left upper quadrant, normal LFTs, but elevated lipase at 638. She was admitted at that time for pain and nausea control.  During that 14 day hospital stay she continued to complain of nausea, vomiting, and abdominal pain.  She underwent extensive evaluation for these symptoms including a CT scan of the head and MRI of the brain, both of some abnormalities and neurology with consultation. A cortisol level was checked and was extremely low so endocrinology was side consulted, but they did not think that it needed to be treated.  She had not been able to eat and was receiving TNA.  Anyway, it was eventually determined that maybe her pain medications are causing her nausea, vomiting and these were decreased with resultant decrease in nausea and vomiting. She was also having diarrhea and a fecal elastase was extremely low, therefore,  she was started on pancreatic enzymes and continues those at this point. She was also seen by [facility] for surgical evaluation regarding resection of the recurrent pancreatic cyst.  Did not feel comfortable performing the surgery and recommended that she follow up with Dr. Marilynn Rail at [facility] who they discussed the case with over the phone during the hospitalization.  Now, she has been re-hospitalized 2 days ago on December 9 for recurrent abdominal pain, nausea, vomiting.  Pain began abruptly the day of admission.  Until then she was doing well with no nausea, vomiting, abdominal pain, or diarrhea.  She was eating well.  She is taking her pancreatic enzymes with meals.  Says that the nausea and vomiting is only when she eats.  Pain is 9/10 without pain meds and 4-5/10 with pain meds.  She states that she did follow-up at [facility] and was seen by Dr. Marilynn Rail on within the past few weeks at which time he said that he was not going to be quick to operate and that she needed to quit smoking for 6 months before any surgical intervention would be performed.  Has follow-up at 6 months, in April.  She has quit smoking two months ago actually.  CT scan on this admission shows the following:  IMPRESSION:  1. Again noted lobulated cystic lesion within pancreatic body  measures 3.9 x 4.5 cm. On the prior exam measures 4.4 x 3.7 cm.  There is no evidence of acute pancreatitis.  2. 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  3. No pericecal inflammation. Normal appendix.  4. No small bowel obstruction.  5.  At least 3 upper abdominal wall ventral hernias containing fat  without evidence of acute complication.   Lipase is normal at 16.  Amylase slightly elevated at 150, but obviously non-specific.  CBC and CMP are normal.  GI is again being consulted for her ongoing symptoms of nausea and vomiting, but mostly, abdominal pain.    Past Medical History  Diagnosis Date  . Hernia 03-24-13    ventral  hernia remains   . GERD (gastroesophageal reflux disease)   . Pancreatitis 03-24-13    2002, 2'2013, 03-14-13  . Pancreatic cyst 2002 onset  . Anxiety   . Depression     "recent breakup with partner of 15 yrs"  . Adrenal insufficiency 04/23/2013    ??    Past Surgical History  Procedure Laterality Date  . Abdominal surgery  ~ 2007    some sort of pancreatic cyst drainage.   . Cholecystectomy      ~ age 61-laparoscopic  . Adenoidectomy    . Abdominal surgery      stent to pancreatic cyst that became infected within 36 hours  . Eus N/A 04/14/2013    Procedure: UPPER ENDOSCOPIC ULTRASOUND (EUS) LINEAR;  Surgeon: Rachael Fee, MD;  Location: [facility];  Service: Endoscopy;  Laterality: N/A;    Prior to Admission medications   Medication Sig Start Date End Date Taking? Authorizing Provider  Pancrelipase, Lip-Prot-Amyl, [phone] UNITS CPEP Take 2 capsules by mouth 3 (three) times daily before meals. 03/23/13  Yes Tora Kindred York, PA-C  promethazine (PHENERGAN) 25 MG tablet Take 12.5 mg by mouth every 6 (six) hours as needed for nausea or vomiting.   Yes Historical Provider, MD    Current Facility-Administered Medications  Medication Dose Route Frequency Provider Last Rate Last Dose  . dextrose 5 % and 0.9 % NaCl with KCl 20 mEq/L infusion   Intravenous Continuous Houston Siren, MD 125 mL/hr at 07/28/13 0758    . diphenhydrAMINE (BENADRYL) injection 25 mg  25 mg Intravenous Q6H PRN Houston Siren, MD   25 mg at 07/28/13 1002  . HYDROmorphone (DILAUDID) injection 1-2 mg  1-2 mg Intravenous Q3H PRN Leda Gauze, NP   1 mg at 07/28/13 1130  . ondansetron (ZOFRAN) tablet 4 mg  4 mg Oral Q6H PRN Houston Siren, MD       Or  . ondansetron [facility]) injection 4 mg  4 mg Intravenous Q6H PRN Houston Siren, MD   4 mg at 07/28/13 1126    Allergies as of 07/26/2013 - Review Complete 07/26/2013  Allergen Reaction Noted  . Penicillins Anaphylaxis 03/15/2013  . Latex Itching 03/15/2013  . Morphine and related  Nausea And Vomiting 03/15/2013    Family History  Problem Relation Age of Onset  . Lupus Neg Hx   . Sarcoidosis Neg Hx   . Pancreatitis Neg Hx   . Colitis Maternal Aunt   . Diabetes Mother   . Diabetes Father   . Diabetes      many family members    History   Social History  . Marital Status: Single    Spouse Name: N/A    Number of Children: N/A  . Years of Education: N/A   Occupational History  . Not on file.   Social History Main Topics  . Smoking status: Former Smoker -- 0.50 packs/day for 20 years    Types: Cigarettes    Quit date: 05/26/2013  . Smokeless tobacco: Never Used  . Alcohol Use: No  . Drug  Use: No  . Sexual Activity: Not Currently   Other Topics Concern  . Not on file   Social History Narrative   Lives permanently in [place] with mom and stepfather.  Has not started working yet and was unemployed in [place].             Review of Systems: Ten point ROS is O/W negative except as mentioned in HPI.  Physical Exam: Vital signs in last 24 hours: Temp:  [97.3 F (36.3 C)-97.4 F (36.3 C)] 97.4 F (36.3 C) (12/11 0642) Pulse Rate:  [63-65] 65 (12/11 0642) Resp:  [16] 16 (12/11 0642) BP: (91-94)/(58-60) 91/58 mmHg (12/11 0642) SpO2:  [94 %-96 %] 96 % (12/11 0642) Last BM Date: 07/26/13 General:   Alert, Well-developed, well-nourished, pleasant and cooperative in NAD Head:  Normocephalic and atraumatic.  Diffuse alopecia. Eyes:  Sclera clear, no icterus.  Conjunctiva pink. Ears:  Normal auditory acuity. Mouth:  No deformity or lesions.   Lungs:  Clear throughout to auscultation.  No wheezes, crackles, or rhonchi.  Heart:  Regular rate and rhythm; no murmurs, clicks, rubs,  or gallops. Abdomen:  Soft, obese, non-distended.  BS present.  Epigastric TTP without R/R/G.   Rectal:  Deferred  Msk:  Symmetrical without gross deformities. Pulses:  Normal pulses noted. Extremities:  Without clubbing or edema. Neurologic:  Alert and  oriented x4;   grossly normal neurologically. Skin:  Intact without significant lesions or rashes.  Multiple tattoos. Psych:  Alert and cooperative. Normal mood and affect.  Intake/Output from previous day: 12/10 0701 - 12/11 0700 In: 2255.4 [P.O.:720; I.V.:1535.4] Out: 440 [Urine:440]  Lab Results:  Recent Labs  07/26/13 1720 07/26/13 1732 07/28/13 0505 07/28/13 0806  WBC 8.2  --  QUESTIONABLE RESULTS, RECOMMEND RECOLLECT TO VERIFY 5.1  HGB 12.2 14.3 QUESTIONABLE RESULTS, RECOMMEND RECOLLECT TO VERIFY 11.1*  HCT 36.2 42.0 QUESTIONABLE RESULTS, RECOMMEND RECOLLECT TO VERIFY 33.4*  PLT 303  --  QUESTIONABLE RESULTS, RECOMMEND RECOLLECT TO VERIFY 271   BMET  Recent Labs  07/26/13 1720 07/26/13 1732 07/28/13 0806  NA 134* 142 138  K 3.9 4.1 4.2  CL 99 104 106  CO2 23  --  22  GLUCOSE 89 91 92  BUN 9 7 7   CREATININE 0.71 0.80 0.66  CALCIUM 9.8  --  8.7   LFT  Recent Labs  07/28/13 0806  PROT 7.2  ALBUMIN 3.3*  AST 23  ALT 27  ALKPHOS 77  BILITOT 0.3   Studies/Results: Dg Lumbar Spine Complete  07/26/2013   CLINICAL DATA:  Low back pain.  Pain radiates down both legs.  EXAM: LUMBAR SPINE - COMPLETE 4+ VIEW  COMPARISON:  CT abdomen pelvis 04/19/2013. Abdominal radiographs 03/20/2013.  FINDINGS: There is no evidence of acute lumbar spine fracture or listhesis. Vertebral body heights are preserved. Bridging anterior osteophytes are present at T10-11 and T11-12. Disc spaces are maintained in the lumbar spine. Surgical clips are noted in the right upper quadrant of the abdomen.  IMPRESSION: No evidence of acute osseous abnormality or significant degenerative change in the lumbar spine.   Electronically Signed   By: Sebastian Ache   On: 07/26/2013 18:36   Ct Abdomen Pelvis W Contrast  07/26/2013   CLINICAL DATA:  Mid abdominal pain, history of pancreatitis  EXAM: CT ABDOMEN AND PELVIS WITH CONTRAST  TECHNIQUE: Multidetector CT imaging of the abdomen and pelvis was performed using the  standard protocol following bolus administration of intravenous contrast.  CONTRAST:  50mL OMNIPAQUE  IOHEXOL 300 MG/ML SOLN, OMNIPAQUE IOHEXOL 300 MG/ML SOLN  COMPARISON:  05/07/2013  FINDINGS: Lung bases are unremarkable. Sagittal images of the spine shows degenerative changes lower thoracic spine. Liver, spleen and right adrenal are unremarkable. Left adrenal at the adenoma is stable in size in appearance. Multilobulated cystic lesion at the junction of pancreatic body with the tail measures 4.5 x 3.9 cm. On the prior exam measures 4.4 x 3.7 cm. There is no evidence of pancreatitis.  Kidneys are symmetrical in size and enhancement. No hydronephrosis or hydroureter. There is a midline upper ventral hernia containing fat measures 3 x 4.5 cm. There is a 2nd left paramedial ventral hernia containing fat measures 3.1 by 2 cm. There is no evidence of acute complication. There is a 3rd midline ventral hernia containing fat axial image 42 measures 3.5 by 6.3 cm. There is no evidence of acute complication.  No small bowel obstruction. No ascites or free air. No adenopathy. There is no pericecal inflammation. The terminal ileum is unremarkable. Normal appendix is partially visualized in coronal image 70.  The urinary bladder is unremarkable. Uterus and adnexa are unremarkable.  IMPRESSION: 1. Again noted lobulated cystic lesion within pancreatic body measures 3.9 x 4.5 cm. On the prior exam measures 4.4 x 3.7 cm. There is no evidence of acute pancreatitis. 2. 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 3. No pericecal inflammation.  Normal appendix. 4. No small bowel obstruction. 5. At least 3 upper abdominal wall ventral hernias containing fat without evidence of acute complication.   Electronically Signed   By: Natasha Mead M.D.   On: 07/26/2013 20:32    IMPRESSION:  -Abdominal pain with nausea and vomiting in the setting of known recurrent pancreatic cyst. CT scan is negative for pancreatitis. Lipase is  normal.  ? Cause of her recurrent intermittent symptoms.  -Pancreatic cyst:  Follows at [facility] for ? future resection. -Pancreatic insufficiency:  Is on pancreatic enzymes. -Anxiety and depression  PLAN: -Anti-emetics and pain control. -Will discuss with Dr. Marina Goodell regarding further recommendations.   ZEHR, JESSICA D.  07/28/2013, 1:26 PM  Pager number [phone]  GI ATTENDING  History, laboratories, x-rays reviewed. Patient personally seen and examined. She has been known to GI service over the past 6 months regarding problems with recurrent abdominal pain. Intermittent problems with nausea and vomiting. Does have a history of pancreatic cyst which has been evaluated thoroughly. Presents back to the hospital with abdominal pain, nausea, and vomiting. Laboratories unremarkable. X-rays without acute abnormalities. Has been seen at [facility] regarding possible pancreatic surgery. Not felt necessary at this time. She is felt to have a history of drug-seeking behavior, here and [place]. From a GI standpoint, we have no particular suggestions her recommendations. I would manage her pain with as little narcotics as possible. Antiemetics as needed. Advance diet as tolerated. She generally knows when she is well enough to go home. I discussed this with her. Call for questions. Will sign off  Rajinder Mesick N. Eda Keys., M.D. [facility] Division of Gastroenterology

## 2013-07-29 ENCOUNTER — Inpatient Hospital Stay (HOSPITAL_COMMUNITY): Payer: Medicaid Other

## 2013-07-29 LAB — COMPREHENSIVE METABOLIC PANEL
ALT: 21 U/L (ref 0–35)
AST: 16 U/L (ref 0–37)
Albumin: 3 g/dL — ABNORMAL LOW (ref 3.5–5.2)
Alkaline Phosphatase: 72 U/L (ref 39–117)
CO2: 22 mEq/L (ref 19–32)
Calcium: 8.4 mg/dL (ref 8.4–10.5)
Creatinine, Ser: 0.66 mg/dL (ref 0.50–1.10)
Potassium: 4.1 mEq/L (ref 3.5–5.1)
Sodium: 138 mEq/L (ref 135–145)
Total Bilirubin: 0.3 mg/dL (ref 0.3–1.2)

## 2013-07-29 LAB — CBC
HCT: 30.8 % — ABNORMAL LOW (ref 36.0–46.0)
Hemoglobin: 10.1 g/dL — ABNORMAL LOW (ref 12.0–15.0)
MCH: 28.8 pg (ref 26.0–34.0)
MCHC: 32.8 g/dL (ref 30.0–36.0)
MCV: 87.7 fL (ref 78.0–100.0)
RBC: 3.51 MIL/uL — ABNORMAL LOW (ref 3.87–5.11)
RDW: 14.1 % (ref 11.5–15.5)

## 2013-07-29 MED ORDER — DIPHENHYDRAMINE HCL 25 MG PO CAPS
25.0000 mg | ORAL_CAPSULE | Freq: Four times a day (QID) | ORAL | Status: DC | PRN
  Administered 2013-07-29: 25 mg via ORAL
  Filled 2013-07-29 (×2): qty 1

## 2013-07-29 MED ORDER — OXYCODONE HCL 5 MG PO TABS
5.0000 mg | ORAL_TABLET | ORAL | Status: DC | PRN
  Administered 2013-07-29 – 2013-07-31 (×3): 5 mg via ORAL
  Filled 2013-07-29 (×3): qty 1

## 2013-07-29 MED ORDER — HYDROMORPHONE HCL PF 1 MG/ML IJ SOLN
1.0000 mg | INTRAMUSCULAR | Status: DC | PRN
  Administered 2013-07-29 – 2013-07-31 (×11): 1 mg via INTRAVENOUS
  Filled 2013-07-29 (×11): qty 1

## 2013-07-29 NOTE — Progress Notes (Signed)
[facility] PROGRESS NOTE  Mary [name] WUJ:[id number] DOB: 1976-02-24 DOA: 07/26/2013 PCP: No primary provider on file.  Assessment/Plan:  Intractable abdominal pain with recurrent vomiting  -Etiology is unclear??exacerbation of chronic pancreatitis  -No epidemiologic history to suggest exotic etiology  -Check urine drug screen--positive only for opiates  -The patient has had extensive workup in the past from GI and general surgery  -Vomiting has improved, but continues to have abdominal pain requiring frequent hydromorphone dosing  -Appreciate GI consultation--no further intervention at this time -Labs are reassuring--lipase 16  -?nonspecific gastritis  -CT abdomen negative for acute findings  -Treated conservatively with IV fluids, opioids, antiemetics  -Patient appears to be having drug seeking behavior--reported pain and out of proportion with physical examination  -Previous Cortrosyn stimulation test negative for primary adrenal insufficiency  -Advance diet to full liquids -Encouraged patient to use oral opioids, minimize opioid use -check abdominal xray today chronic pancreatitis/pancreatic cyst  -Doubt pancreatic cyst as etiology at this time  -Previous biopsy of pancreatic cyst negative for malignancy  -May be underlying functional/psychosocial issues contributing  -Follows Dr. Rise Mu at [facility] seen 06/14/13--recommended tobacco cessation x6 months before proceeding with any surgical management  Left adrenal nodule  -Outpatient followup  Ventral hernia  -No sign of incarceration per CT of the abdomen and pelvis  history of tobacco use  -Has quit for 2 months  Elevated TSH  -Check free T4  Abnormal CSF -Noted to have oligoclonal bands 04/25/2013 -I have made an appointment for outpatient neurology appointment--08/29/2013 at 2:30 PM--Dr. Shon Millet -Patient is currently asymptomatic without any paresthesias or weakness Family Communication: none  today  Disposition Plan: Home when medically stable        Procedures/Studies: Dg Lumbar Spine Complete  07/26/2013   CLINICAL DATA:  Low back pain.  Pain radiates down both legs.  EXAM: LUMBAR SPINE - COMPLETE 4+ VIEW  COMPARISON:  CT abdomen pelvis 04/19/2013. Abdominal radiographs 03/20/2013.  FINDINGS: There is no evidence of acute lumbar spine fracture or listhesis. Vertebral body heights are preserved. Bridging anterior osteophytes are present at T10-11 and T11-12. Disc spaces are maintained in the lumbar spine. Surgical clips are noted in the right upper quadrant of the abdomen.  IMPRESSION: No evidence of acute osseous abnormality or significant degenerative change in the lumbar spine.   Electronically Signed   By: Sebastian Ache   On: 07/26/2013 18:36   Ct Abdomen Pelvis W Contrast  07/26/2013   CLINICAL DATA:  Mid abdominal pain, history of pancreatitis  EXAM: CT ABDOMEN AND PELVIS WITH CONTRAST  TECHNIQUE: Multidetector CT imaging of the abdomen and pelvis was performed using the standard protocol following bolus administration of intravenous contrast.  CONTRAST:  50mL OMNIPAQUE IOHEXOL 300 MG/ML SOLN, OMNIPAQUE IOHEXOL 300 MG/ML SOLN  COMPARISON:  05/07/2013  FINDINGS: Lung bases are unremarkable. Sagittal images of the spine shows degenerative changes lower thoracic spine. Liver, spleen and right adrenal are unremarkable. Left adrenal at the adenoma is stable in size in appearance. Multilobulated cystic lesion at the junction of pancreatic body with the tail measures 4.5 x 3.9 cm. On the prior exam measures 4.4 x 3.7 cm. There is no evidence of pancreatitis.  Kidneys are symmetrical in size and enhancement. No hydronephrosis or hydroureter. There is a midline upper ventral hernia containing fat measures 3 x 4.5 cm. There is a 2nd left paramedial ventral hernia containing fat measures 3.1 by 2 cm. There is no evidence of acute complication. There is a 3rd  midline ventral hernia  containing fat axial image 42 measures 3.5 by 6.3 cm. There is no evidence of acute complication.  No small bowel obstruction. No ascites or free air. No adenopathy. There is no pericecal inflammation. The terminal ileum is unremarkable. Normal appendix is partially visualized in coronal image 70.  The urinary bladder is unremarkable. Uterus and adnexa are unremarkable.  IMPRESSION: 1. Again noted lobulated cystic lesion within pancreatic body measures 3.9 x 4.5 cm. On the prior exam measures 4.4 x 3.7 cm. There is no evidence of acute pancreatitis. 2. 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 3. No pericecal inflammation.  Normal appendix. 4. No small bowel obstruction. 5. At least 3 upper abdominal wall ventral hernias containing fat without evidence of acute complication.   Electronically Signed   By: Natasha Mead M.D.   On: 07/26/2013 20:32   Dg Abd Acute W/chest  07/29/2013   CLINICAL DATA:  Abdominal pain  EXAM: ACUTE ABDOMEN SERIES (ABDOMEN 2 VIEW & CHEST 1 VIEW)  COMPARISON:  None.  FINDINGS: The heart and pulmonary vascularity are within normal limits. The lungs are clear bilaterally. Bilateral nipple piercings are seen. The abdomen demonstrates a nonobstructive bowel gas pattern. Contrast material is seen from the recent CT examination. No free air is noted. No abnormal mass or abnormal calcifications are seen. Postsurgical changes are noted.  IMPRESSION: No acute abnormality is identified.   Electronically Signed   By: Alcide Clever M.D.   On: 07/29/2013 13:59         Subjective: Patient states that her abdominal pain is not much better. No further vomiting. Denies any fevers, chills, chest discomfort, shortness of breath, dysuria, hematuria, diarrhea  Objective: Filed Vitals:   07/28/13 1406 07/28/13 2149 07/29/13 0510 07/29/13 1436  BP: 108/68 103/69 89/65 107/67  Pulse: 65 68 68 59  Temp: 97.3 F (36.3 C) 98.3 F (36.8 C) 98.3 F (36.8 C) 98.6 F (37 C)  TempSrc: Oral  Oral Oral Oral  Resp: 16 16 16 16   Height:      Weight:      SpO2: 95% 99% 99% 99%    Intake/Output Summary (Last 24 hours) at 07/29/13 1711 Last data filed at 07/29/13 1300  Gross per 24 hour  Intake   5335 ml  Output    500 ml  Net   4835 ml   Weight change:  Exam:   General:  Pt is alert, follows commands appropriately, not in acute distress  HEENT: No icterus, No thrush, [place]/AT  Cardiovascular: RRR, S1/S2, no rubs, no gallops  Respiratory: CTA bilaterally, no wheezing, no crackles, no rhonchi  Abdomen: Soft/+BS, epigastric tenderness without any rebound or peritoneal signs, non distended, no guarding  Extremities: No edema, No lymphangitis, No petechiae, No rashes, no synovitis  Data Reviewed: Basic Metabolic Panel:  Recent Labs Lab 07/26/13 1720 07/26/13 1732 07/28/13 0806 07/29/13 0443  NA 134* 142 138 138  K 3.9 4.1 4.2 4.1  CL 99 104 106 108  CO2 23  --  22 22  GLUCOSE 89 91 92 103*  BUN 9 7 7  5*  CREATININE 0.71 0.80 0.66 0.66  CALCIUM 9.8  --  8.7 8.4  MG  --   --  1.9  --    Liver Function Tests:  Recent Labs Lab 07/26/13 1720 07/28/13 0806 07/29/13 0443  AST 21 23 16   ALT 23 27 21   ALKPHOS 99 77 72  BILITOT 0.2* 0.3 0.3  PROT 9.0*  7.2 6.7  ALBUMIN 4.3 3.3* 3.0*    Recent Labs Lab 07/26/13 1720  LIPASE 16   No results found for this basename: AMMONIA,  in the last 168 hours CBC:  Recent Labs Lab 07/26/13 1720 07/26/13 1732 07/28/13 0505 07/28/13 0806 07/29/13 0443  WBC 8.2  --  QUESTIONABLE RESULTS, RECOMMEND RECOLLECT TO VERIFY 5.1 6.1  NEUTROABS 5.3  --   --   --   --   HGB 12.2 14.3 QUESTIONABLE RESULTS, RECOMMEND RECOLLECT TO VERIFY 11.1* 10.1*  HCT 36.2 42.0 QUESTIONABLE RESULTS, RECOMMEND RECOLLECT TO VERIFY 33.4* 30.8*  MCV 85.2  --  QUESTIONABLE RESULTS, RECOMMEND RECOLLECT TO VERIFY 87.9 87.7  PLT 303  --  QUESTIONABLE RESULTS, RECOMMEND RECOLLECT TO VERIFY 271 236   Cardiac Enzymes: No results found for this  basename: CKTOTAL, CKMB, CKMBINDEX, TROPONINI,  in the last 168 hours BNP: No components found with this basename: POCBNP,  CBG: No results found for this basename: GLUCAP,  in the last 168 hours  No results found for this or any previous visit (from the past 240 hour(s)).   Scheduled Meds:  Continuous Infusions: . dextrose 5 % and 0.9 % NaCl with KCl 20 mEq/L 125 mL/hr at 07/29/13 1439     Adarryl Goldammer, DO  [facility] Pager [phone]  If 7PM-7AM, please contact night-coverage [email or web address] Password [email or web address] 07/29/2013, 5:11 PM   LOS: 3 days

## 2013-07-30 ENCOUNTER — Inpatient Hospital Stay (HOSPITAL_COMMUNITY): Payer: Medicaid Other

## 2013-07-30 ENCOUNTER — Encounter (HOSPITAL_COMMUNITY): Payer: Self-pay | Admitting: Radiology

## 2013-07-30 LAB — CBC
HCT: 32.1 % — ABNORMAL LOW (ref 36.0–46.0)
Hemoglobin: 10.7 g/dL — ABNORMAL LOW (ref 12.0–15.0)
MCH: 28.9 pg (ref 26.0–34.0)
MCHC: 33.3 g/dL (ref 30.0–36.0)
MCV: 86.8 fL (ref 78.0–100.0)
RBC: 3.7 MIL/uL — ABNORMAL LOW (ref 3.87–5.11)

## 2013-07-30 MED ORDER — IOHEXOL 300 MG/ML  SOLN
100.0000 mL | Freq: Once | INTRAMUSCULAR | Status: AC | PRN
  Administered 2013-07-30: 100 mL via INTRAVENOUS

## 2013-07-30 MED ORDER — PANTOPRAZOLE SODIUM 40 MG IV SOLR
40.0000 mg | Freq: Every day | INTRAVENOUS | Status: DC
  Administered 2013-07-30 (×2): 40 mg via INTRAVENOUS
  Filled 2013-07-30 (×3): qty 40

## 2013-07-30 MED ORDER — IOHEXOL 300 MG/ML  SOLN
50.0000 mL | Freq: Once | INTRAMUSCULAR | Status: AC | PRN
  Administered 2013-07-30: 50 mL via ORAL

## 2013-07-30 MED ORDER — HYDROMORPHONE HCL PF 1 MG/ML IJ SOLN
1.0000 mg | Freq: Once | INTRAMUSCULAR | Status: AC
  Administered 2013-07-30: 1 mg via INTRAVENOUS
  Filled 2013-07-30: qty 1

## 2013-07-30 MED ORDER — PANCRELIPASE (LIP-PROT-AMYL) 12000-38000 UNITS PO CPEP
2.0000 | ORAL_CAPSULE | Freq: Three times a day (TID) | ORAL | Status: DC
  Administered 2013-07-30 – 2013-07-31 (×2): 2 via ORAL
  Filled 2013-07-30 (×5): qty 2

## 2013-07-30 NOTE — Progress Notes (Signed)
[facility] PROGRESS NOTE  Mary [name] ZOX:[id number] DOB: 1976-04-04 DOA: 07/26/2013 PCP: No primary provider on file.  Assessment/Plan: Intractable abdominal pain with recurrent vomiting  -Etiology is unclear??exacerbation of chronic pancreatitis  -No epidemiologic history to suggest exotic etiology  -Check urine drug screen--positive only for opiates  -The patient has had extensive workup in the past from GI and general surgery  -Patient endorsed vomiting again, but none has been witnessed or noted from nursing and ancillary care staff -Appreciate GI consultation--no further intervention at this time  -Labs are reassuring--lipase 16  -CT abdomen negative for acute findings  -Treated conservatively with IV fluids, opioids, antiemetics  -Patient appears to be having drug seeking behavior--reported pain and out of proportion with physical examination  -Previous Cortrosyn stimulation test negative for primary adrenal insufficiency  -Advance diet to full liquids  -Encouraged patient to use oral opioids, minimize opioid use  -Repeat CT abdomen and pelvis as patient reports increasing abdominal pain -I told the patient that I will not be increasing her frequency or dosing of hydromorphone and pt has to make transition to oral opiods -Advance diet if CT abdomen and pelvis without acute findings -Check lipase and lactic acid in the morning  chronic pancreatitis/pancreatic cyst  -Doubt pancreatic cyst as etiology at this time  -Previous biopsy of pancreatic cyst negative for malignancy  -May be underlying functional/psychosocial issues contributing  -Follows Dr. Rise Mu at [facility] seen 06/14/13--recommended tobacco cessation x6 months before proceeding with any surgical management  Left adrenal nodule  -Outpatient followup  Ventral hernia  -No sign of incarceration per CT of the abdomen and pelvis  history of tobacco use  -Has quit for 2 months  Elevated TSH  -Check  free T4  Abnormal CSF  -Noted to have oligoclonal bands 04/25/2013  -I have made an appointment for outpatient neurology appointment--08/29/2013 at 2:30 PM--Dr. Shon Millet  -Patient is currently asymptomatic without any paresthesias or weakness  Family Communication: none today  Disposition Plan: Home when medically stable       Procedures/Studies: Dg Lumbar Spine Complete  07/26/2013   CLINICAL DATA:  Low back pain.  Pain radiates down both legs.  EXAM: LUMBAR SPINE - COMPLETE 4+ VIEW  COMPARISON:  CT abdomen pelvis 04/19/2013. Abdominal radiographs 03/20/2013.  FINDINGS: There is no evidence of acute lumbar spine fracture or listhesis. Vertebral body heights are preserved. Bridging anterior osteophytes are present at T10-11 and T11-12. Disc spaces are maintained in the lumbar spine. Surgical clips are noted in the right upper quadrant of the abdomen.  IMPRESSION: No evidence of acute osseous abnormality or significant degenerative change in the lumbar spine.   Electronically Signed   By: Sebastian Ache   On: 07/26/2013 18:36   Ct Abdomen Pelvis W Contrast  07/26/2013   CLINICAL DATA:  Mid abdominal pain, history of pancreatitis  EXAM: CT ABDOMEN AND PELVIS WITH CONTRAST  TECHNIQUE: Multidetector CT imaging of the abdomen and pelvis was performed using the standard protocol following bolus administration of intravenous contrast.  CONTRAST:  50mL OMNIPAQUE IOHEXOL 300 MG/ML SOLN, OMNIPAQUE IOHEXOL 300 MG/ML SOLN  COMPARISON:  05/07/2013  FINDINGS: Lung bases are unremarkable. Sagittal images of the spine shows degenerative changes lower thoracic spine. Liver, spleen and right adrenal are unremarkable. Left adrenal at the adenoma is stable in size in appearance. Multilobulated cystic lesion at the junction of pancreatic body with the tail measures 4.5 x 3.9 cm. On the prior exam measures 4.4 x 3.7 cm. There is  no evidence of pancreatitis.  Kidneys are symmetrical in size and enhancement. No  hydronephrosis or hydroureter. There is a midline upper ventral hernia containing fat measures 3 x 4.5 cm. There is a 2nd left paramedial ventral hernia containing fat measures 3.1 by 2 cm. There is no evidence of acute complication. There is a 3rd midline ventral hernia containing fat axial image 42 measures 3.5 by 6.3 cm. There is no evidence of acute complication.  No small bowel obstruction. No ascites or free air. No adenopathy. There is no pericecal inflammation. The terminal ileum is unremarkable. Normal appendix is partially visualized in coronal image 70.  The urinary bladder is unremarkable. Uterus and adnexa are unremarkable.  IMPRESSION: 1. Again noted lobulated cystic lesion within pancreatic body measures 3.9 x 4.5 cm. On the prior exam measures 4.4 x 3.7 cm. There is no evidence of acute pancreatitis. 2. 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 3. No pericecal inflammation.  Normal appendix. 4. No small bowel obstruction. 5. At least 3 upper abdominal wall ventral hernias containing fat without evidence of acute complication.   Electronically Signed   By: Natasha Mead M.D.   On: 07/26/2013 20:32   Dg Abd Acute W/chest  07/29/2013   CLINICAL DATA:  Abdominal pain  EXAM: ACUTE ABDOMEN SERIES (ABDOMEN 2 VIEW & CHEST 1 VIEW)  COMPARISON:  None.  FINDINGS: The heart and pulmonary vascularity are within normal limits. The lungs are clear bilaterally. Bilateral nipple piercings are seen. The abdomen demonstrates a nonobstructive bowel gas pattern. Contrast material is seen from the recent CT examination. No free air is noted. No abnormal mass or abnormal calcifications are seen. Postsurgical changes are noted.  IMPRESSION: No acute abnormality is identified.   Electronically Signed   By: Alcide Clever M.D.   On: 07/29/2013 13:59         Subjective: Patient states that she has more abdominal pain today. Denies any fevers, chills, chest pain, shortness breath. She claims to have had  multiple episodes of vomiting, but nursing and ancillary staff have not witnessed or seen any vomiting. Denies any dysuria or hematuria.  Objective: Filed Vitals:   07/29/13 1436 07/29/13 1821 07/29/13 2100 07/30/13 0637  BP: 107/67 103/67 103/67 94/67  Pulse: 59 64 60 56  Temp: 98.6 F (37 C) 97.8 F (36.6 C) 98.4 F (36.9 C) 97.6 F (36.4 C)  TempSrc: Oral Oral Oral Oral  Resp: 16 16 16 16   Height:      Weight:      SpO2: 99% 98% 99% 99%    Intake/Output Summary (Last 24 hours) at 07/30/13 1320 Last data filed at 07/30/13 1159  Gross per 24 hour  Intake    120 ml  Output   1550 ml  Net  -1430 ml   Weight change:  Exam:   General:  Pt is alert, follows commands appropriately, not in acute distress  HEENT: No icterus, No thrush,[place]/AT  Cardiovascular: RRR, S1/S2, no rubs, no gallops  Respiratory: CTA bilaterally, no wheezing, no crackles, no rhonchi  Abdomen: Soft/+BS, epigastric tenderness without any rebound, non distended, no guarding  Extremities: No edema, No lymphangitis, No petechiae, No rashes, no synovitis  Data Reviewed: Basic Metabolic Panel:  Recent Labs Lab 07/26/13 1720 07/26/13 1732 07/28/13 0806 07/29/13 0443  NA 134* 142 138 138  K 3.9 4.1 4.2 4.1  CL 99 104 106 108  CO2 23  --  22 22  GLUCOSE 89 91 92 103*  BUN  9 7 7  5*  CREATININE 0.71 0.80 0.66 0.66  CALCIUM 9.8  --  8.7 8.4  MG  --   --  1.9  --    Liver Function Tests:  Recent Labs Lab 07/26/13 1720 07/28/13 0806 07/29/13 0443  AST 21 23 16   ALT 23 27 21   ALKPHOS 99 77 72  BILITOT 0.2* 0.3 0.3  PROT 9.0* 7.2 6.7  ALBUMIN 4.3 3.3* 3.0*    Recent Labs Lab 07/26/13 1720  LIPASE 16   No results found for this basename: AMMONIA,  in the last 168 hours CBC:  Recent Labs Lab 07/26/13 1720 07/26/13 1732 07/28/13 0505 07/28/13 0806 07/29/13 0443 07/30/13 0435  WBC 8.2  --  QUESTIONABLE RESULTS, RECOMMEND RECOLLECT TO VERIFY 5.1 6.1 6.4  NEUTROABS 5.3  --   --    --   --   --   HGB 12.2 14.3 QUESTIONABLE RESULTS, RECOMMEND RECOLLECT TO VERIFY 11.1* 10.1* 10.7*  HCT 36.2 42.0 QUESTIONABLE RESULTS, RECOMMEND RECOLLECT TO VERIFY 33.4* 30.8* 32.1*  MCV 85.2  --  QUESTIONABLE RESULTS, RECOMMEND RECOLLECT TO VERIFY 87.9 87.7 86.8  PLT 303  --  QUESTIONABLE RESULTS, RECOMMEND RECOLLECT TO VERIFY 271 236 217   Cardiac Enzymes: No results found for this basename: CKTOTAL, CKMB, CKMBINDEX, TROPONINI,  in the last 168 hours BNP: No components found with this basename: POCBNP,  CBG: No results found for this basename: GLUCAP,  in the last 168 hours  No results found for this or any previous visit (from the past 240 hour(s)).   Scheduled Meds: . lipase/protease/amylase  2 capsule Oral TID WC  . pantoprazole (PROTONIX) IV  40 mg Intravenous QHS   Continuous Infusions: . dextrose 5 % and 0.9 % NaCl with KCl 20 mEq/L 125 mL/hr at 07/30/13 0649     Hollie Wojahn, DO  [facility] Pager [phone]  If 7PM-7AM, please contact night-coverage [email or web address] Password [email or web address] 07/30/2013, 1:20 PM   LOS: 4 days

## 2013-07-31 LAB — CBC
HCT: 33.4 % — ABNORMAL LOW (ref 36.0–46.0)
Hemoglobin: 11.2 g/dL — ABNORMAL LOW (ref 12.0–15.0)
MCH: 29.5 pg (ref 26.0–34.0)
MCHC: 33.5 g/dL (ref 30.0–36.0)
RBC: 3.8 MIL/uL — ABNORMAL LOW (ref 3.87–5.11)
WBC: 8.2 10*3/uL (ref 4.0–10.5)

## 2013-07-31 LAB — COMPREHENSIVE METABOLIC PANEL
ALT: 16 U/L (ref 0–35)
BUN: 3 mg/dL — ABNORMAL LOW (ref 6–23)
CO2: 20 mEq/L (ref 19–32)
Calcium: 8.6 mg/dL (ref 8.4–10.5)
Creatinine, Ser: 0.6 mg/dL (ref 0.50–1.10)
GFR calc Af Amer: >90 mL/min (ref >90)
GFR calc non Af Amer: >90 mL/min (ref >90)
Glucose, Bld: 108 mg/dL — ABNORMAL HIGH (ref 70–99)
Sodium: 137 mEq/L (ref 135–145)
Total Protein: 6.8 g/dL (ref 6.0–8.3)

## 2013-07-31 LAB — LIPASE, BLOOD: Lipase: 9 U/L — ABNORMAL LOW (ref 11–59)

## 2013-07-31 LAB — LACTIC ACID, PLASMA: Lactic Acid, Venous: 1.6 mmol/L (ref 0.5–2.2)

## 2013-07-31 MED ORDER — TRIAMCINOLONE ACETONIDE 0.1 % EX CREA: TOPICAL_CREAM | Freq: Two times a day (BID) | CUTANEOUS | Status: DC

## 2013-07-31 MED ORDER — OXYCODONE HCL 5 MG PO TABS: 5.0000 mg | ORAL_TABLET | ORAL | Status: DC | PRN

## 2013-07-31 MED ORDER — PANCRELIPASE (LIP-PROT-AMYL) 12000-38000 UNITS PO CPEP: 2.0000 | ORAL_CAPSULE | Freq: Three times a day (TID) | ORAL | Status: AC

## 2013-07-31 MED ORDER — TRIAMCINOLONE ACETONIDE 0.1 % EX CREA
1.0000 "application " | TOPICAL_CREAM | Freq: Two times a day (BID) | CUTANEOUS | Status: DC
  Filled 2013-07-31: qty 15

## 2013-07-31 NOTE — Progress Notes (Signed)
Pt requesting IV pain med for "abd pain" informed pt that she needs to start on oral pain meds and solid food since she may be discharged today. Patient stated she would eat and take the oral and just wants to "get out of here". Notified MD. Awaiting discharge.

## 2013-07-31 NOTE — Discharge Summary (Signed)
Physician Discharge Summary  Mary [name] ZOX:[id number] DOB: 08/15/1976 DOA: 07/26/2013  PCP: No primary provider on file.  Admit date: 07/26/2013 Discharge date: 07/31/2013  Recommendations for Outpatient Follow-up:  1. Pt will need to follow up with PCP in 2 weeks post discharge 2. Please obtain BMP to evaluate electrolytes and kidney function 3. Please also check CBC to evaluate Hg and Hct levels  Discharge Diagnoses:  Principal Problem:   Abdominal pain Active Problems:   history of recurrent ideopathic pancreatitis   Chronic pancreatitis with chronic pseudocysts   Nausea and vomiting in adult Intractable abdominal pain with recurrent vomiting  -Etiology is unclear??exacerbation of chronic pancreatitis  -No epidemiologic history to suggest exotic etiology  -Check urine drug screen--positive only for opiates  -The patient has had extensive workup in the past from GI and general surgery  -Patient endorsed vomiting again on 07/30/13, but none has been witnessed or noted from nursing and ancillary care staff  -Appreciate GI consultation--no further intervention at this time  -Labs are reassuring--lipase 16, lactic acid 1.6;  Repeat lipase 9 on 07/31/13 -CT abdomen negative for acute findings  -Treated conservatively with IV fluids, opioids, antiemetics  -Patient appears to be having drug seeking behavior--reported pain and out of proportion with physical examination  -Previous Cortrosyn stimulation test negative for primary adrenal insufficiency  -Advance diet low fiber diet which pt tolerated without vomiting -Encouraged patient to use oral opioids, minimize opioid use  -once IV opioids were no longer offered, patient wanted to go home -Repeat CT abdomen and pelvis as patient reports increasing abdominal pain--no abscess, no peripancreatic inflammation -Rx oxycodone #20, one po q 4 hrs prn pain, No RF chronic pancreatitis/pancreatic cyst  -Doubt pancreatic cyst as etiology at  this time  -Previous biopsy of pancreatic cyst negative for malignancy  -May be underlying functional/psychosocial issues contributing  -Follows Dr. Rise Mu at [facility] seen 06/14/13--recommended tobacco cessation x6 months before proceeding with any surgical management  -Follow up with Dr. 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 Shon Millet  -Patient is currently asymptomatic without any paresthesias or weakness  Family Communication: none today  Disposition Plan: Home when medically stable   Discharge Condition: stable  Disposition:  home  Diet:low fiber Wt Readings from Last 3 Encounters:  07/26/13 101.878 kg (224 lb 9.6 oz)  05/19/13 102.059 kg (225 lb)  04/14/13 111.3 kg (245 lb 6 oz)    History of present illness:  37 y.o. 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 Her cyst had become somewhat larger recently and she was hospitalized late July into early August with pancreatitis and terminal ileitis with severe diarrhea. She was discharged home to complete a course of cipro/flagyl. She presented for endoscopic ultrasound and biopsy of her pancreatic cyst, which was done by Dr. Christella Hartigan on 8/28. She had 16mL of thick clear fluid removed without complication and it was sent for cytology, amylase/lipase. She felt well post-procedure, but then presented the next day with epigastric pain and nausea and vomiting,  nonbilious/nonbloody. In the ER she had XR abd which  demonstrated a single mildly prominent small bowel loop in the left upper quadrant, normal LFTs, but elevated lipase at 638. She was admitted at that time for pain and nausea control. During that 14 day hospital stay she continued to complain of nausea, vomiting, and abdominal pain. She underwent extensive evaluation for these symptoms including a CT scan of the head and MRI of the brain, both of some abnormalities and neurology with consultation. A cortisol level was checked and was extremely low so endocrinology was side consulted, but they did not think that it needed to be treated. Cortrosyn stimulation test was negative for adrenal insufficiency.  She had not been able to eat and was receiving TNA. Anyway, it was eventually determined that maybe her pain medications are causing her nausea, vomiting and these were decreased with resultant decrease in nausea and vomiting. She was also having diarrhea and a fecal elastase was extremely low, therefore, she was started on pancreatic enzymes and continues those at this point. She was also seen by [facility] for surgical evaluation regarding resection of the recurrent pancreatic cyst. Did not feel comfortable performing the surgery and recommended that she follow up with Dr. Marilynn Rail at [facility] who they discussed the case with over the phone during the hospitalization.  Now, she has been re-hospitalized  on December 9 for recurrent abdominal pain, nausea, vomiting. Pain began abruptly the day of admission. Until then she was doing well with no nausea, vomiting, abdominal pain, or diarrhea. She was eating well. She is taking her pancreatic enzymes with meals. Says that the nausea and vomiting is only when she eats. Pain is 9/10 without pain meds and 4-5/10 with pain meds. She states that she did follow-up at [facility] and was seen by Dr. Marilynn Rail on within the past few weeks at which time he said that he was not going to be quick to operate and that she needed to quit  smoking for 6 months before any surgical intervention would be performed. Has follow-up at 6 months, in April. She has quit smoking two months ago actually  During hospitalization, the patient required frequent intravenous hydromorphone. She was initially n.p.o. Her diet was gradually advanced after her vomiting improved. She exhibited drug-seeking behavior. Repeat CT of the abdomen and pelvis failed to show any acute or new pathology. Her lipase and lactic acid were within normal limits. She did not have any leukocytosis. She remained afebrile and hemodynamically stable. Her electrolytes were optimized. Her diet was advanced which she tolerated without vomiting. Once intravenous hydromorphone was not offered, the patient wanted to go home.    Consultants: GI--Dr. Marina Goodell  Discharge Exam: Filed Vitals:   07/31/13 0500  BP: 113/72  Pulse: 65  Temp: 97.6 F (36.4 C)  Resp: 18   Filed Vitals:   07/30/13 0637 07/30/13 1454 07/30/13 2134 07/31/13 0500  BP: 94/67 104/70 107/71 113/72  Pulse: 56 62 71 65  Temp: 97.6 F (36.4 C) 97.9 F (36.6 C) 98.1 F (36.7 C) 97.6 F (36.4 C)  TempSrc: Oral Oral Oral Oral  Resp: 16 15 16 18   Height:      Weight:      SpO2: 99% 98% 100% 98%   General: A&O x 3, NAD, pleasant, cooperative Cardiovascular: RRR, no rub, no gallop, no S3 Respiratory: CTAB, no wheeze, no rhonchi Abdomen:soft, nontender, nondistended, positive bowel sounds Extremities: No edema, No lymphangitis, no petechiae  Discharge Instructions  Discharge Orders   Future Appointments Provider Department Dept Phone   08/29/2013 3:00 PM Adam Gus Rankin, DO [facility] Neurology [facility] [phone]   Future Orders Complete By Expires   Increase activity slowly  As directed        Medication List         lipase/protease/amylase 09811 UNITS Cpep capsule  Commonly known as:  CREON-12/PANCREASE  Take 2 capsules by mouth 3 (three) times daily with meals.     oxyCODONE 5 MG  immediate release tablet  Commonly known as:  Oxy IR/ROXICODONE  Take 1 tablet (5 mg total) by mouth every 4 (four) hours as needed for severe pain.     promethazine 25 MG tablet  Commonly known as:  PHENERGAN  Take 12.5 mg by mouth every 6 (six) hours as needed for nausea or vomiting.     triamcinolone cream 0.1 %  Commonly known as:  KENALOG  Apply topically 2 (two) times daily. Apply to plaque on left knee twice a day x 1 week         The results of significant diagnostics from this hospitalization (including imaging, microbiology, ancillary and laboratory) are listed below for reference.    Significant Diagnostic Studies: Dg Lumbar Spine Complete  07/26/2013   CLINICAL DATA:  Low back pain.  Pain radiates down both legs.  EXAM: LUMBAR SPINE - COMPLETE 4+ VIEW  COMPARISON:  CT abdomen pelvis 04/19/2013. Abdominal radiographs 03/20/2013.  FINDINGS: There is no evidence of acute lumbar spine fracture or listhesis. Vertebral body heights are preserved. Bridging anterior osteophytes are present at T10-11 and T11-12. Disc spaces are maintained in the lumbar spine. Surgical clips are noted in the right upper quadrant of the abdomen.  IMPRESSION: No evidence of acute osseous abnormality or significant degenerative change in the lumbar spine.   Electronically Signed   By: Sebastian Ache   On: 07/26/2013 18:36   Ct Abdomen W Contrast  07/30/2013   CLINICAL DATA:  Worsening abdominal pain, chronic pancreatitis  EXAM: CT ABDOMEN WITH CONTRAST  TECHNIQUE: Multidetector CT imaging of the abdomen was performed using the standard protocol following bolus administration of intravenous contrast.  CONTRAST:  50mL OMNIPAQUE IOHEXOL 300 MG/ML SOLN, OMNIPAQUE IOHEXOL 300 MG/ML SOLN  COMPARISON:  CT abdomen pelvis dated 07/26/2013  FINDINGS: Trace right pelvic ascites.  Lungs otherwise clear.  Liver is notable for mild hepatic steatosis.  Pancreas is notable for a 4.7 x 5.3 cm complex cystic lesion in the  pancreatic tail (series 2/ image 23), unchanged. No associated main pancreatic ductal dilatation or atrophy. No definite peripancreatic inflammatory changes.  Spleen and right adrenal gland are within normal limits.  1.7 x 1.6 cm probable left adrenal adenoma (series 2/image 21).  Kidneys are within normal limits.  No hydronephrosis.  Visualized bowel is unremarkable.  Three fat containing ventral hernias (series 2/image 30, 34, and 42).  No abdominal ascites.  Small retroperitoneal lymph nodes, including a 7 mm short axis aortocaval node (series 2/image 57), within normal limits.  Degenerative changes of the visualized thoracolumbar spine.  IMPRESSION: No interval change from recent CT.  5.3 cm complex cystic lesion in the pancreatic tail, likely reflecting a pseudocyst given the history of pancreatitis. No peripancreatic inflammatory changes by CT.  Additional ancillary findings as above.   Electronically Signed   By: Charline Bills M.D.   On: 07/30/2013 17:27   Ct Abdomen Pelvis W Contrast  07/26/2013   CLINICAL DATA:  Mid abdominal pain, history of  pancreatitis  EXAM: CT ABDOMEN AND PELVIS WITH CONTRAST  TECHNIQUE: Multidetector CT imaging of the abdomen and pelvis was performed using the standard protocol following bolus administration of intravenous contrast.  CONTRAST:  50mL OMNIPAQUE IOHEXOL 300 MG/ML SOLN, OMNIPAQUE IOHEXOL 300 MG/ML SOLN  COMPARISON:  05/07/2013  FINDINGS: Lung bases are unremarkable. Sagittal images of the spine shows degenerative changes lower thoracic spine. Liver, spleen and right adrenal are unremarkable. Left adrenal at the adenoma is stable in size in appearance. Multilobulated cystic lesion at the junction of pancreatic body with the tail measures 4.5 x 3.9 cm. On the prior exam measures 4.4 x 3.7 cm. There is no evidence of pancreatitis.  Kidneys are symmetrical in size and enhancement. No hydronephrosis or hydroureter. There is a midline upper ventral hernia  containing fat measures 3 x 4.5 cm. There is a 2nd left paramedial ventral hernia containing fat measures 3.1 by 2 cm. There is no evidence of acute complication. There is a 3rd midline ventral hernia containing fat axial image 42 measures 3.5 by 6.3 cm. There is no evidence of acute complication.  No small bowel obstruction. No ascites or free air. No adenopathy. There is no pericecal inflammation. The terminal ileum is unremarkable. Normal appendix is partially visualized in coronal image 70.  The urinary bladder is unremarkable. Uterus and adnexa are unremarkable.  IMPRESSION: 1. Again noted lobulated cystic lesion within pancreatic body measures 3.9 x 4.5 cm. On the prior exam measures 4.4 x 3.7 cm. There is no evidence of acute pancreatitis. 2. 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 3. No pericecal inflammation.  Normal appendix. 4. No small bowel obstruction. 5. At least 3 upper abdominal wall ventral hernias containing fat without evidence of acute complication.   Electronically Signed   By: Natasha Mead M.D.   On: 07/26/2013 20:32   Dg Abd Acute W/chest  07/29/2013   CLINICAL DATA:  Abdominal pain  EXAM: ACUTE ABDOMEN SERIES (ABDOMEN 2 VIEW & CHEST 1 VIEW)  COMPARISON:  None.  FINDINGS: The heart and pulmonary vascularity are within normal limits. The lungs are clear bilaterally. Bilateral nipple piercings are seen. The abdomen demonstrates a nonobstructive bowel gas pattern. Contrast material is seen from the recent CT examination. No free air is noted. No abnormal mass or abnormal calcifications are seen. Postsurgical changes are noted.  IMPRESSION: No acute abnormality is identified.   Electronically Signed   By: Alcide Clever M.D.   On: 07/29/2013 13:59     Microbiology: No results found for this or any previous visit (from the past 240 hour(s)).   Labs: Basic Metabolic Panel:  Recent Labs Lab 07/26/13 1720 07/26/13 1732 07/28/13 0806 07/29/13 0443 07/31/13 0511  NA 134* 142  138 138 137  K 3.9 4.1 4.2 4.1 4.3  CL 99 104 106 108 109  CO2 23  --  22 22 20   GLUCOSE 89 91 92 103* 108*  BUN 9 7 7  5* 3*  CREATININE 0.71 0.80 0.66 0.66 0.60  CALCIUM 9.8  --  8.7 8.4 8.6  MG  --   --  1.9  --   --    Liver Function Tests:  Recent Labs Lab 07/26/13 1720 07/28/13 0806 07/29/13 0443 07/31/13 0511  AST 21 23 16 13   ALT 23 27 21 16   ALKPHOS 99 77 72 76  BILITOT 0.2* 0.3 0.3 0.3  PROT 9.0* 7.2 6.7 6.8  ALBUMIN 4.3 3.3* 3.0* 3.0*    Recent Labs Lab  07/26/13 1720 07/31/13 0511  LIPASE 16 9*   No results found for this basename: AMMONIA,  in the last 168 hours CBC:  Recent Labs Lab 07/26/13 1720  07/28/13 0505 07/28/13 0806 07/29/13 0443 07/30/13 0435 07/31/13 0511  WBC 8.2  --  QUESTIONABLE RESULTS, RECOMMEND RECOLLECT TO VERIFY 5.1 6.1 6.4 8.2  NEUTROABS 5.3  --   --   --   --   --   --   HGB 12.2  < > QUESTIONABLE RESULTS, RECOMMEND RECOLLECT TO VERIFY 11.1* 10.1* 10.7* 11.2*  HCT 36.2  < > QUESTIONABLE RESULTS, RECOMMEND RECOLLECT TO VERIFY 33.4* 30.8* 32.1* 33.4*  MCV 85.2  --  QUESTIONABLE RESULTS, RECOMMEND RECOLLECT TO VERIFY 87.9 87.7 86.8 87.9  PLT 303  --  QUESTIONABLE RESULTS, RECOMMEND RECOLLECT TO VERIFY 271 236 217 204  < > = values in this interval not displayed. Cardiac Enzymes: No results found for this basename: CKTOTAL, CKMB, CKMBINDEX, TROPONINI,  in the last 168 hours BNP: No components found with this basename: POCBNP,  CBG: No results found for this basename: GLUCAP,  in the last 168 hours  Time coordinating discharge:  Greater than 30 minutes  Signed:  Dolan Xia, DO [facility] Pager: [phone] 07/31/2013, 11:18 AM

## 2013-08-03 ENCOUNTER — Emergency Department (HOSPITAL_COMMUNITY)
Admission: EM | Admit: 2013-08-03 | Discharge: 2013-08-03 | Disposition: A | Discharge status: Home / Self Care | Payer: Medicaid Other | Attending: Emergency Medicine | Admitting: Emergency Medicine

## 2013-08-03 ENCOUNTER — Encounter (HOSPITAL_COMMUNITY): Payer: Self-pay | Admitting: Emergency Medicine

## 2013-08-03 DIAGNOSIS — Z91040 Latex allergy status: Secondary | ICD-10-CM | POA: Insufficient documentation

## 2013-08-03 DIAGNOSIS — Z880 Allergy status to penicillin: Secondary | ICD-10-CM | POA: Insufficient documentation

## 2013-08-03 DIAGNOSIS — IMO0002 Reserved for concepts with insufficient information to code with codable children: Secondary | ICD-10-CM | POA: Insufficient documentation

## 2013-08-03 DIAGNOSIS — Z79899 Other long term (current) drug therapy: Secondary | ICD-10-CM | POA: Insufficient documentation

## 2013-08-03 DIAGNOSIS — L0390 Cellulitis, unspecified: Secondary | ICD-10-CM

## 2013-08-03 DIAGNOSIS — Z8719 Personal history of other diseases of the digestive system: Secondary | ICD-10-CM | POA: Insufficient documentation

## 2013-08-03 DIAGNOSIS — Z8659 Personal history of other mental and behavioral disorders: Secondary | ICD-10-CM | POA: Insufficient documentation

## 2013-08-03 DIAGNOSIS — Z87891 Personal history of nicotine dependence: Secondary | ICD-10-CM | POA: Insufficient documentation

## 2013-08-03 MED ORDER — CLINDAMYCIN HCL 150 MG PO CAPS: 300.0000 mg | ORAL_CAPSULE | Freq: Two times a day (BID) | ORAL | Status: DC

## 2013-08-03 NOTE — Progress Notes (Signed)
[facility] provided pt with a list of primary care resources, a [facility] [employer] Card application, and [facility] information.  °

## 2013-08-03 NOTE — ED Notes (Signed)
Pt c/o R forearm pain and swelling.  Pain score 3/10.  Pt sts "I was here in the hospital and my IV infiltrated.  It's still hurting and swollen."

## 2013-08-03 NOTE — ED Provider Notes (Signed)
Medical screening examination/treatment/procedure(s) were conducted as a shared visit with non-physician practitioner(s) and myself.  I personally evaluated the patient during the encounter.  EKG Interpretation    Date/Time:    Ventricular Rate:    PR Interval:    QRS Duration:   QT Interval:    QTC Calculation:   R Axis:     Text Interpretation:             37 year old female with abdominal pain and nausea and vomiting. Past history significant for idiopathic pancreatitis and subsequent pseudocysts. States that current symptoms feel similar to previous exacerbations. Patient has been extensively worked up by gastroenterology and surgery. No fevers or chills. Workup today has been relatively unremarkable but having difficulty controlling patient's symptoms. Will admit for further symptomatic control.  Raeford Razor, MD 08/03/13 [phone]

## 2013-08-03 NOTE — ED Provider Notes (Signed)
CSN: [id number]     Arrival date & time 08/03/13  1305 History  This chart was scribed for Marlon Pel, PA working with Laray Anger, DO by Quintella Reichert, ED Scribe. This patient was seen in room WTR6/WTR6 and the patient's care was started at 1:40 PM.   Chief Complaint  Patient presents with  . Arm Pain    The history is provided by the patient. No language interpreter was used.    HPI Comments: LEILANEE RIGHETTI is a 37 y.o. female who presents to the Emergency Department complaining of 3 days of persistent, worsening pain, redness and swelling to her right forearm.  Pt states she was in the hospital 3 days ago for pancreatitis and had an IV placed in that area which infiltrated.  She states her symptoms began at that time and have persisted since then.  She denies fever.  Swelling and pain both worsen when she moves and uses the arm and patient requests a sling to remind her not to move the arm.    Past Medical History  Diagnosis Date  . Hernia 03-24-13    ventral hernia remains   . GERD (gastroesophageal reflux disease)   . Pancreatitis 03-24-13    2002, 2'2013, 03-14-13  . Pancreatic cyst 2002 onset  . Anxiety   . Depression     "recent breakup with partner of 15 yrs"  . Adrenal insufficiency 04/23/2013    ??    Past Surgical History  Procedure Laterality Date  . Abdominal surgery  ~ 2007    some sort of pancreatic cyst drainage.   . Cholecystectomy      ~ age 51-laparoscopic  . Adenoidectomy    . Abdominal surgery      stent to pancreatic cyst that became infected within 36 hours  . Eus N/A 04/14/2013    Procedure: UPPER ENDOSCOPIC ULTRASOUND (EUS) LINEAR;  Surgeon: Rachael Fee, MD;  Location: [facility] ENDOSCOPY;  Service: Endoscopy;  Laterality: N/A;    Family History  Problem Relation Age of Onset  . Lupus Neg Hx   . Sarcoidosis Neg Hx   . Pancreatitis Neg Hx   . Colitis Maternal Aunt   . Diabetes Mother   . Diabetes Father   . Diabetes      many family  members    History  Substance Use Topics  . Smoking status: Former Smoker -- 0.50 packs/day for 20 years    Types: Cigarettes    Quit date: 05/26/2013  . Smokeless tobacco: Never Used  . Alcohol Use: No    OB History   Grav Para Term Preterm Abortions TAB SAB Ect Mult Living                  Review of Systems  Constitutional: Negative for fever.  Musculoskeletal:       Pain and swelling to right forearm  All other systems reviewed and are negative.     Allergies  Penicillins; Latex; and Morphine and related  Home Medications   Current Outpatient Rx  Name  Route  Sig  Dispense  Refill  . lipase/protease/amylase (CREON-12/PANCREASE) 12000 UNITS CPEP capsule   Oral   Take 2 capsules by mouth 3 (three) times daily with meals.   180 capsule   2   . oxyCODONE (OXY IR/ROXICODONE) 5 MG immediate release tablet   Oral   Take 1 tablet (5 mg total) by mouth every 4 (four) hours as needed for severe pain.   20  tablet   0   . promethazine (PHENERGAN) 25 MG tablet   Oral   Take 12.5 mg by mouth every 6 (six) hours as needed for nausea or vomiting.         . clindamycin (CLEOCIN) 150 MG capsule   Oral   Take 2 capsules (300 mg total) by mouth 2 (two) times daily.   20 capsule   0   . triamcinolone cream (KENALOG) 0.1 %   Topical   Apply topically 2 (two) times daily. Apply to plaque on left knee twice a day x 1 week   15 g   0    BP 107/68  Pulse 85  Temp(Src) 98.1 F (36.7 C) (Oral)  Resp 18  SpO2 97%  LMP 07/14/2013  Physical Exam  Nursing note and vitals reviewed. Constitutional: She is oriented to person, place, and time. She appears well-developed and well-nourished. No distress.  HENT:  Head: Normocephalic and atraumatic.  Eyes: EOM are normal.  Neck: Neck supple. No tracheal deviation present.  Cardiovascular: Normal rate.   Pulmonary/Chest: Effort normal. No respiratory distress.  Musculoskeletal: Normal range of motion.  Swelling,  induration, and erythema to her right forearm.  No decreased ROM.  Strong radial pulse.  Neurological: She is alert and oriented to person, place, and time.  Skin: Skin is warm and dry.  Psychiatric: She has a normal mood and affect. Her behavior is normal.    ED Course  Procedures (including critical care time)  DIAGNOSTIC STUDIES: Oxygen Saturation is 97% on room air, normal by my interpretation.    COORDINATION OF CARE: 1:44 PM-Discussed treatment plan which includes antibiotics and sling application with pt at bedside and pt agreed to plan.    Labs Review Labs Reviewed - No data to display  Imaging Review No results found.  EKG Interpretation   None       MDM   1. Cellulitis     Clindamycin Rx  37 y.o.Keylie Beavers Nunnery's evaluation in the Emergency Department is complete. It has been determined that no acute conditions requiring further emergency intervention are present at this time. The patient/guardian have been advised of the diagnosis and plan. We have discussed signs and symptoms that warrant return to the ED, such as changes or worsening in symptoms.  Vital signs are stable at discharge. Filed Vitals:   08/03/13 1314  BP: 107/68  Pulse: 85  Temp: 98.1 F (36.7 C)  Resp: 18    Patient/guardian has voiced understanding and agreed to follow-up with the PCP or specialist.  I personally performed the services described in this documentation, which was scribed in my presence. The recorded information has been reviewed and is accurate.   Dorthula Matas, PA-C 08/03/13 1350

## 2013-08-03 NOTE — ED Notes (Signed)
Pt states when she move her arms and works a lot it makes it worse. Her mother is requesting she gets a sling to remind her not to move her arm.

## 2013-08-04 NOTE — ED Provider Notes (Signed)
Medical screening examination/treatment/procedure(s) were performed by non-physician practitioner and as supervising physician I was immediately available for consultation/collaboration.  EKG Interpretation   None         Laray Anger, DO 08/04/13 1428

## 2013-08-29 ENCOUNTER — Encounter: Payer: Self-pay | Admitting: Neurology

## 2013-08-29 ENCOUNTER — Ambulatory Visit (INDEPENDENT_AMBULATORY_CARE_PROVIDER_SITE_OTHER): Payer: Self-pay | Admitting: Neurology

## 2013-08-29 VITALS — BP 122/72 | HR 92 | Resp 20 | Ht 64.0 in | Wt 243.3 lb

## 2013-08-29 DIAGNOSIS — G35 Multiple sclerosis: Secondary | ICD-10-CM

## 2013-08-29 DIAGNOSIS — R930 Abnormal findings on diagnostic imaging of skull and head, not elsewhere classified: Secondary | ICD-10-CM

## 2013-08-29 LAB — RHEUMATOID FACTOR: Rhuematoid fact SerPl-aCnc: <10 IU/mL (ref <=14)

## 2013-08-29 LAB — SEDIMENTATION RATE: SED RATE: 47 mm/h — AB (ref 0–22)

## 2013-08-29 MED ORDER — DIAZEPAM 5 MG PO TABS
ORAL_TABLET | ORAL | Status: DC
Start: 2013-08-29 — End: 2013-08-29

## 2013-08-29 MED ORDER — DIAZEPAM 5 MG PO TABS: ORAL_TABLET | ORAL | Status: DC

## 2013-08-29 NOTE — Progress Notes (Signed)
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  Records and images were personally reviewed where available.  She has a history of recurrent pancreatitis with pancreatic cyst of unknown etiology.  She was admitted to the hospital in late-August and early-September for pancreatitis associated with nausea, vomiting and diarrhea.  She underwent a CT of the head on 04/22/13 to rule out intracranial cause of nausea and vomiting, which revealed extensive hypodensity in the bilateral primarily subcortical white matter.  A follow up MRI of the brain with and without contrast performed on 04/22/13 revealed extensive white matter disease without abnormal enhancement.  She had no prior imaging to compare.  She underwent a lumbar puncture, which revealed protein 25, glucose 69, cell count 2, negative gram stain, IgG 3.3, and positive OCBs (3).  Serum B12 was 423.    She denies prior history of vision loss or ocular pain.  She denies history of pain down the spine.  She says that she occasionally notes transient numbness in the hands, but nothing significant.  Otherwise, no numbness or tingling of the limbs.  She says that her right leg feels a little weak when she walks, but attributes it to "bad hip."  She sometimes stumbles when she walks.  In hind sight, she cannot recall any any episodes of neurological deficits.    She has an occasional migraine but nothing severe.  She denies family history of neurological conditions.  PAST MEDICAL HISTORY: Past Medical History  Diagnosis Date  . Hernia 03-24-13   ventral hernia remains   . GERD (gastroesophageal reflux disease)   . Pancreatitis 03-24-13    2002, 2'2013, 03-14-13  . Pancreatic cyst 2002 onset  . Anxiety   . Depression     "recent breakup with partner of 15 yrs"  . Adrenal insufficiency 04/23/2013    ??    PAST SURGICAL HISTORY: Past Surgical History  Procedure Laterality Date  . Abdominal surgery  ~ 2007    some sort of pancreatic cyst drainage.   . Cholecystectomy      ~ age 67-laparoscopic  . Adenoidectomy    . 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 Eus N/A 04/14/2013    Procedure: UPPER ENDOSCOPIC ULTRASOUND (EUS) LINEAR;  Surgeon: Milus Banister, MD;  Location: [facility];  Service: Endoscopy;  Laterality: N/A;    MEDICATIONS: Current Outpatient Prescriptions on File Prior to Visit  Medication Sig Dispense Refill  . lipase/protease/amylase (CREON-12/PANCREASE) 12000 UNITS CPEP capsule Take 2 capsules by mouth 3 (three) times daily with meals.  180 capsule  2  . triamcinolone cream (KENALOG) 0.1 % Apply topically 2 (two) times daily. Apply to plaque on left knee twice a day x 1 week  15 g  0   No current facility-administered medications on file prior to visit.    ALLERGIES: Allergies  Allergen Reactions  . Penicillins Anaphylaxis  . Latex Itching  . Morphine And Related Nausea And Vomiting    FAMILY HISTORY: Family History  Problem Relation Age of Onset  . Lupus Neg Hx   .  Sarcoidosis Neg Hx   . Pancreatitis Neg Hx   . Colitis Maternal Aunt   . Diabetes Mother   . Diabetes Father   . Diabetes      many family members    SOCIAL HISTORY: History   Social History  . Marital Status: Single    Spouse Name: N/A    Number of Children: N/A  . Years of Education: N/A   Occupational History  . Not on file.   Social History Main Topics  . Smoking status: Former Smoker -- 0.50 packs/day for 20 years    Types: Cigarettes    Quit date: 05/26/2013  .  Smokeless tobacco: Never Used  . Alcohol Use: No  . Drug Use: No  . Sexual Activity: Not Currently   Other Topics Concern  . Not on file   Social History Narrative   Lives permanently in [place] with mom and stepfather.  Has not started working yet and was unemployed in [place].             REVIEW OF SYSTEMS: Constitutional: No fevers, chills, or sweats, no generalized fatigue, change in appetite Eyes: No visual changes, double vision, eye pain Ear, nose and throat: No hearing loss, ear pain, nasal congestion, sore throat Cardiovascular: No chest pain, palpitations Respiratory:  No shortness of breath at rest or with exertion, wheezes GastrointestinaI: No nausea, vomiting, diarrhea, abdominal pain, fecal incontinence Genitourinary:  No dysuria, urinary retention or frequency Musculoskeletal:  No neck pain, back pain Integumentary: No rash, pruritus, skin lesions Neurological: as above Psychiatric: No depression, insomnia, anxiety Endocrine: No palpitations, fatigue, diaphoresis, mood swings, change in appetite, change in weight, increased thirst Hematologic/Lymphatic:  No anemia, purpura, petechiae. Allergic/Immunologic: no itchy/runny eyes, nasal congestion, recent allergic reactions, rashes  PHYSICAL EXAM: Filed Vitals:   08/29/13 1445  BP: 122/72  Pulse: 92  Resp: 20   General: No acute distress Head:  Normocephalic/atraumatic Neck: supple, no paraspinal tenderness, full range of motion Back: No paraspinal tenderness Heart: regular rate and rhythm Lungs: Clear to auscultation bilaterally. Vascular: No carotid bruits. Neurological Exam: Mental status: alert and oriented to person, place, and time, speech fluent and not dysarthric, language intact. Cranial nerves: CN I: not tested CN II: pupils equal, round and reactive to light, visual fields intact, fundi unremarkable. CN III, IV, VI:  full range of motion, no nystagmus, no ptosis CN V: facial sensation intact CN  VII: upper and lower face symmetric CN VIII: hearing intact CN IX, X: gag intact, uvula midline CN XI: sternocleidomastoid and trapezius muscles intact CN XII: tongue midline Bulk & Tone: normal, no fasciculations. Motor: 5/5 throughout Sensation: endorses slight pinprick numbness in left hand, specifically the thumb and dorsum of the hand.  Otherwise, pinprick and vibration intact. Deep Tendon Reflexes: 2+ throughout, toes down Finger to nose testing: normal Heel to shin: normal Gait: normal stance and stride.  Able to walk on toes, heels and in tandem. Romberg negative.  IMPRESSION: Abnormal MRI with CSF results suspicious for demyelinating disease.  No clinical history of multiple sclerosis  PLAN: 1.  Will repeat MRI of brain w/wo as well as MRI of cervical spine. 2.  Check ANA, ESR, RF, Lyme, SSA/SSB, antiproteinase 3 abs, Mpo/pr-3 (anca) abs, and vit d 3.  Follow up soon after MRI to discuss plan  Thank you for allowing me to take part in the care of this patient.  Metta Clines, DO

## 2013-08-29 NOTE — Patient Instructions (Addendum)
1.  We will order another MRI of the brain, as well as an MRI of the cervical spine, to look for any new spots. 2.  We will also check some blood work 3.  Follow up right after MRI.  Your MRI is scheduled at [facility] located at [address] (beside the hospital) on Monday, January 19th at 7:00 PM.   Please arrive 15 minutes prior to your appointment.     [phone].

## 2013-08-30 LAB — SJOGREN'S SYNDROME ANTIBODS(SSA + SSB): SSA (Ro) (ENA) Antibody, IgG: 27 AU/mL (ref <30)

## 2013-08-30 LAB — MPO/PR-3 (ANCA) ANTIBODIES: Serine Protease 3: <1 AU/mL (ref <20)

## 2013-08-30 LAB — VITAMIN D 25 HYDROXY (VIT D DEFICIENCY, FRACTURES): Vit D, 25-Hydroxy: 20 ng/mL — ABNORMAL LOW (ref 30–89)

## 2013-08-30 LAB — ANA: Anti Nuclear Antibody(ANA): POSITIVE — AB

## 2013-08-30 LAB — B. BURGDORFI ANTIBODIES: B burgdorferi Ab IgG+IgM: 0.39 {ISR}

## 2013-08-30 LAB — ANTI-NUCLEAR AB-TITER (ANA TITER): ANA Titer 1: 1:160 {titer} — ABNORMAL HIGH

## 2013-09-05 ENCOUNTER — Ambulatory Visit (HOSPITAL_COMMUNITY)
Admission: RE | Admit: 2013-09-05 | Discharge: 2013-09-05 | Disposition: A | Discharge status: Home / Self Care | Payer: Self-pay | Source: Ambulatory Visit | Attending: Neurology | Admitting: Neurology

## 2013-09-05 ENCOUNTER — Ambulatory Visit (HOSPITAL_COMMUNITY)
Admission: RE | Admit: 2013-09-05 | Discharge: 2013-09-05 | Disposition: A | Discharge status: Home / Self Care | Payer: Medicaid Other | Source: Ambulatory Visit | Attending: Neurology | Admitting: Neurology

## 2013-09-05 DIAGNOSIS — R262 Difficulty in walking, not elsewhere classified: Secondary | ICD-10-CM | POA: Insufficient documentation

## 2013-09-05 DIAGNOSIS — E236 Other disorders of pituitary gland: Secondary | ICD-10-CM | POA: Insufficient documentation

## 2013-09-05 DIAGNOSIS — G9389 Other specified disorders of brain: Secondary | ICD-10-CM | POA: Insufficient documentation

## 2013-09-05 DIAGNOSIS — G35 Multiple sclerosis: Secondary | ICD-10-CM

## 2013-09-05 MED ORDER — GADOBENATE DIMEGLUMINE 529 MG/ML IV SOLN
20.0000 mL | Freq: Once | INTRAVENOUS | Status: AC | PRN
  Administered 2013-09-05: 20 mL via INTRAVENOUS

## 2013-09-06 ENCOUNTER — Telehealth: Payer: Self-pay | Admitting: *Deleted

## 2013-09-06 LAB — ANTIPROTEINASE 3 (PR-3) ABS: ANCA Proteinase 3: <3.5 U/mL (ref 0.0–3.5)

## 2013-09-06 NOTE — Telephone Encounter (Signed)
Message copied by Fredirick Maudlin on Tue Sep 06, 2013 12:01 PM ------      Message from: JAFFE, ADAM R      Created: Tue Sep 06, 2013  5:33 AM       Please let Ms. Goette know that MRI of cervical spine looks normal.  We will discuss further at her next visit next week.      ----- Message -----         From: Rad Results In Interface         Sent: 09/05/2013   9:03 PM           To: Cira Servant, DO                   ------

## 2013-09-12 ENCOUNTER — Telehealth: Payer: Self-pay | Admitting: Neurology

## 2013-09-12 ENCOUNTER — Ambulatory Visit (INDEPENDENT_AMBULATORY_CARE_PROVIDER_SITE_OTHER): Payer: Self-pay | Admitting: Neurology

## 2013-09-12 ENCOUNTER — Encounter: Payer: Self-pay | Admitting: Neurology

## 2013-09-12 VITALS — BP 118/70 | HR 84 | Resp 18 | Ht 64.0 in | Wt 249.0 lb

## 2013-09-12 DIAGNOSIS — G35 Multiple sclerosis: Secondary | ICD-10-CM

## 2013-09-12 MED ORDER — GLATIRAMER ACETATE 40 MG/ML ~~LOC~~ SOSY: 40.0000 mg | PREFILLED_SYRINGE | SUBCUTANEOUS | Status: DC

## 2013-09-12 MED ORDER — GLATIRAMER ACETATE 40 MG/ML ~~LOC~~ SOSY: 1.0000 | PREFILLED_SYRINGE | SUBCUTANEOUS | Status: DC

## 2013-09-12 NOTE — Progress Notes (Signed)
NEUROLOGY FOLLOW UP OFFICE NOTE  Mary [name] [id number]  HISTORY OF PRESENT ILLNESS: Mary [name] is a 38 year old right-handed woman with history of recurrent pancreatitis, pancreatic cyst status post stent placement in 2007 which became infected and removed, cyst-SBO anastomosis 2007, status post cholecystectomy, and GERD who follows up for possible multiple sclerosis.  She is accompanied by her mother.  Records and images were personally reviewed where available.    She has a history of recurrent pancreatitis with pancreatic cyst of unknown etiology.  She was admitted to the hospital in late-August and early-September for pancreatitis associated with nausea, vomiting and diarrhea.  She underwent a CT of the head on 04/22/13 to rule out intracranial cause of nausea and vomiting, which revealed extensive hypodensity in the bilateral primarily subcortical white matter.  A follow up MRI of the brain with and without contrast performed on 04/22/13 revealed extensive white matter disease without abnormal enhancement.  She had no prior imaging to compare.  She underwent a lumbar puncture, which revealed protein 25, glucose 69, cell count 2, negative gram stain, IgG 3.3, and positive OCBs (3).  Serum B12 was 423.  ANA was positive (1:160, speckled pattern), ESR 47, RF <10, Lyme abs negative, Myeloperoxidase/serine protease 3 abs negative, antiproteinase 3 abs negative, SSA/SSB abs negative, vitamin D 25 hydroxy 20.  Repeat MRI of brain with and without contrast performed on 09/06/13 was stable with no new or enhancing lesions.  MRI of cervical spine with and without contrast was negative.  In retrospect, she reports transient numbness in the hands (left worse than right), as well as transient numbness in the right leg with occasional weakness of the right leg and stumbling.  She denies prior history of vision loss or ocular pain.  She denies history of pain down the spine.    She has an occasional migraine  but nothing severe.  She denies family history of neurological conditions.  PAST MEDICAL HISTORY: Past Medical History  Diagnosis Date  . Hernia 03-24-13    ventral hernia remains   . GERD (gastroesophageal reflux disease)   . Pancreatitis 03-24-13    2002, 2'2013, 03-14-13  . Pancreatic cyst 2002 onset  . Anxiety   . Depression     "recent breakup with partner of 15 yrs"  . Adrenal insufficiency 04/23/2013    ??    MEDICATIONS: Current Outpatient Prescriptions on File Prior to Visit  Medication Sig Dispense Refill  . ibuprofen (ADVIL,MOTRIN) 200 MG tablet Take 200 mg by mouth as needed.      . lipase/protease/amylase (CREON-12/PANCREASE) 12000 UNITS CPEP capsule Take 2 capsules by mouth 3 (three) times daily with meals.  180 capsule  2  . triamcinolone cream (KENALOG) 0.1 % Apply topically 2 (two) times daily. Apply to plaque on left knee twice a day x 1 week  15 g  0   No current facility-administered medications on file prior to visit.    ALLERGIES: Allergies  Allergen Reactions  . Penicillins Anaphylaxis  . Latex Itching  . Morphine And Related Nausea And Vomiting    FAMILY HISTORY: Family History  Problem Relation Age of Onset  . Lupus Neg Hx   . Sarcoidosis Neg Hx   . Pancreatitis Neg Hx   . Ataxia Neg Hx   . Chorea Neg Hx   . Dementia Neg Hx   . Mental retardation Neg Hx   . Migraines Neg Hx   . Multiple sclerosis Neg Hx   . Neurofibromatosis  Neg Hx   . Neuropathy Neg Hx   . Parkinsonism Neg Hx   . Seizures Neg Hx   . Stroke Neg Hx   . Colitis Maternal Aunt   . Diabetes Mother   . Diabetes Father   . Diabetes      many family members    SOCIAL HISTORY: History   Social History  . Marital Status: Single    Spouse Name: N/A    Number of Children: N/A  . Years of Education: N/A   Occupational History  . Not on file.   Social History Main Topics  . Smoking status: Former Smoker -- 0.50 packs/day for 20 years    Types: Cigarettes    Quit date:  05/26/2013  . Smokeless tobacco: Never Used  . Alcohol Use: No  . Drug Use: No  . Sexual Activity: Not Currently   Other Topics Concern  . Not on file   Social History Narrative   Lives permanently in [place] with mom and stepfather.  Has not started working yet and was unemployed in [place].             REVIEW OF SYSTEMS: Constitutional: No fevers, chills, or sweats, no generalized fatigue, change in appetite Eyes: No visual changes, double vision, eye pain Ear, nose and throat: No hearing loss, ear pain, nasal congestion, sore throat Cardiovascular: No chest pain, palpitations Respiratory:  No shortness of breath at rest or with exertion, wheezes GastrointestinaI: Nausea Genitourinary:  No dysuria, urinary retention or frequency Musculoskeletal:  No neck pain, back pain Integumentary: No rash, pruritus, skin lesions Neurological: as above Psychiatric: No depression, insomnia, anxiety Endocrine: No palpitations, fatigue, diaphoresis, mood swings, change in appetite, change in weight, increased thirst Hematologic/Lymphatic:  No anemia, purpura, petechiae. Allergic/Immunologic: no itchy/runny eyes, nasal congestion, recent allergic reactions, rashes  PHYSICAL EXAM: Filed Vitals:   09/12/13 0758  BP: 118/70  Pulse: 84  Resp: 18   General: No acute distress Head:  Normocephalic/atraumatic   IMPRESSION: Based on history of intermittent symptoms of numbness and stumbling, relapsing-remitting MS  PLAN: Discussed various options of therapy and she and her mother decided on Copaxone. 1.  Copaxone 66m three times weekly (provided samples).  Will try and get on assistance program 2.  Start D3 4000 IU daily 3.  Follow up in 3 months.  30 minutes spent with patient and mother, 100% spent counseling, reviewing medication options and coordinating care.  AMetta Clines DO

## 2013-09-12 NOTE — Addendum Note (Signed)
Addended bySilvio Pate on: 09/12/2013 02:49 PM   Modules accepted: Orders

## 2013-09-12 NOTE — Patient Instructions (Signed)
Based on history, MRI and lab results, it looks like you have relapsing-remitting multiple sclerosis 1.  We will get things going about getting coverage for Copaxone.  It is 40mg  injections, taken 3 times a week (monday, Wednesday and Friday).  Review the information I gave you about the available medications.   2.  Start taking vitamin D3 4000 IU daily 3.  Follow up in 3 months.  We will repeat MRI in 6 months.

## 2013-09-12 NOTE — Telephone Encounter (Signed)
Called patient to make her aware of our contact with [employer] for teaching of her Copaxone RX. And to receive Lot Numbers from medication samples that were given. No answer and no way to leave message. Will contact again later.

## 2013-09-12 NOTE — Telephone Encounter (Signed)
Patient made aware and will contact me if she does not hear from them. Lot numbers given and documented in her chart.

## 2013-09-21 ENCOUNTER — Telehealth: Payer: Self-pay | Admitting: Neurology

## 2013-09-21 NOTE — Telephone Encounter (Signed)
Please call, pt has concerns regarding extreme fatigue / Sherri S.

## 2013-09-22 NOTE — Telephone Encounter (Signed)
Patient called stating she is having episodes of  Feeling very tired she has been more stressed the past 2 weeks I explained this could be part the new medication and just the diagnosis its self . Please advise

## 2013-09-22 NOTE — Telephone Encounter (Signed)
I think this is all related to stress as a lot of stuff has been put on her plate lately.

## 2013-09-29 NOTE — Telephone Encounter (Signed)
Mary SalterHaley as been approved for copaxone  40 mg number 84 days . [facility] . She ask about a metal taste I explained that was normal and advised her to sip on  Lemonade before  Injection as well as about 30 minutes after  To help with the taste , She also ask about being tired  I told her to reduce the stress as much as possible. And maintain a good diet as well .She also ask about disability I told her she would need to discuss this with you

## 2013-09-29 NOTE — Telephone Encounter (Signed)
Mary [name] from [facility] called wanting approval for Copaxone 40mg   Please call her [phone]  Opt 5.

## 2013-09-30 NOTE — Telephone Encounter (Signed)
She is not disabled, so there is no indication to fill out disability paperwork.

## 2013-10-12 ENCOUNTER — Telehealth: Payer: Self-pay | Admitting: Neurology

## 2013-10-12 NOTE — Telephone Encounter (Signed)
Patient returning a call please call her back at [phone] please

## 2013-10-12 NOTE — Telephone Encounter (Signed)
Please see note below. 

## 2013-10-12 NOTE — Telephone Encounter (Signed)
Pt called wanting to speak to a nurse. Pt states she does not know if her meds are making her dizzy.

## 2013-10-12 NOTE — Telephone Encounter (Signed)
Patient states her she is very dizzy having to hang on to walls she is wanting to know if copaxone is causing this . Please advise

## 2013-10-14 NOTE — Telephone Encounter (Signed)
Please advise 

## 2013-10-14 NOTE — Telephone Encounter (Signed)
Have her look over the information I gave her regarding the other medications.  She may consider Betaseron or Tecfidera.

## 2013-10-14 NOTE — Telephone Encounter (Signed)
That's not a typical side effect of Copaxone.

## 2013-10-14 NOTE — Telephone Encounter (Signed)
Rarity thinks all of this is coming from stress she states the past 2 days were not so bad and this am she is stressed and the vertigo is there again. She wants to give the Copaxone more time .

## 2013-10-26 ENCOUNTER — Encounter (HOSPITAL_COMMUNITY): Payer: Self-pay | Admitting: Emergency Medicine

## 2013-10-26 ENCOUNTER — Emergency Department (HOSPITAL_COMMUNITY)
Admission: EM | Admit: 2013-10-26 | Discharge: 2013-10-27 | Disposition: A | Discharge status: Home / Self Care | Payer: Medicaid Other | Attending: Emergency Medicine | Admitting: Emergency Medicine

## 2013-10-26 DIAGNOSIS — F411 Generalized anxiety disorder: Secondary | ICD-10-CM | POA: Insufficient documentation

## 2013-10-26 DIAGNOSIS — K469 Unspecified abdominal hernia without obstruction or gangrene: Secondary | ICD-10-CM | POA: Insufficient documentation

## 2013-10-26 DIAGNOSIS — K859 Acute pancreatitis without necrosis or infection, unspecified: Secondary | ICD-10-CM | POA: Insufficient documentation

## 2013-10-26 DIAGNOSIS — K219 Gastro-esophageal reflux disease without esophagitis: Secondary | ICD-10-CM | POA: Insufficient documentation

## 2013-10-26 DIAGNOSIS — F3289 Other specified depressive episodes: Secondary | ICD-10-CM | POA: Insufficient documentation

## 2013-10-26 DIAGNOSIS — R197 Diarrhea, unspecified: Secondary | ICD-10-CM | POA: Insufficient documentation

## 2013-10-26 DIAGNOSIS — Z91040 Latex allergy status: Secondary | ICD-10-CM | POA: Insufficient documentation

## 2013-10-26 DIAGNOSIS — G35 Multiple sclerosis: Secondary | ICD-10-CM | POA: Insufficient documentation

## 2013-10-26 DIAGNOSIS — Z880 Allergy status to penicillin: Secondary | ICD-10-CM | POA: Insufficient documentation

## 2013-10-26 DIAGNOSIS — Z8719 Personal history of other diseases of the digestive system: Secondary | ICD-10-CM | POA: Insufficient documentation

## 2013-10-26 DIAGNOSIS — F329 Major depressive disorder, single episode, unspecified: Secondary | ICD-10-CM | POA: Insufficient documentation

## 2013-10-26 DIAGNOSIS — R109 Unspecified abdominal pain: Secondary | ICD-10-CM | POA: Insufficient documentation

## 2013-10-26 DIAGNOSIS — R112 Nausea with vomiting, unspecified: Secondary | ICD-10-CM | POA: Insufficient documentation

## 2013-10-26 DIAGNOSIS — Z87891 Personal history of nicotine dependence: Secondary | ICD-10-CM | POA: Insufficient documentation

## 2013-10-26 DIAGNOSIS — Z79899 Other long term (current) drug therapy: Secondary | ICD-10-CM | POA: Insufficient documentation

## 2013-10-26 DIAGNOSIS — E2749 Other adrenocortical insufficiency: Secondary | ICD-10-CM | POA: Insufficient documentation

## 2013-10-26 HISTORY — DX: Multiple sclerosis: G35

## 2013-10-26 LAB — CBC WITH DIFFERENTIAL/PLATELET
BASOS ABS: 0.1 10*3/uL (ref 0.0–0.1)
Basophils Relative: 1 % (ref 0–1)
Eosinophils Absolute: 0.4 10*3/uL (ref 0.0–0.7)
Eosinophils Relative: 4 % (ref 0–5)
HEMATOCRIT: 37.9 % (ref 36.0–46.0)
HEMOGLOBIN: 12.6 g/dL (ref 12.0–15.0)
Lymphocytes Relative: 24 % (ref 12–46)
Lymphs Abs: 2.1 10*3/uL (ref 0.7–4.0)
MCH: 27.9 pg (ref 26.0–34.0)
MCHC: 33.2 g/dL (ref 30.0–36.0)
MCV: 84 fL (ref 78.0–100.0)
MONO ABS: 0.7 10*3/uL (ref 0.1–1.0)
MONOS PCT: 8 % (ref 3–12)
NEUTROS ABS: 5.4 10*3/uL (ref 1.7–7.7)
Neutrophils Relative %: 63 % (ref 43–77)
Platelets: 318 10*3/uL (ref 150–400)
RBC: 4.51 MIL/uL (ref 3.87–5.11)
RDW: 13.5 % (ref 11.5–15.5)
WBC: 8.6 10*3/uL (ref 4.0–10.5)

## 2013-10-26 LAB — COMPREHENSIVE METABOLIC PANEL
ALT: 27 U/L (ref 0–35)
AST: 23 U/L (ref 0–37)
Albumin: 3.9 g/dL (ref 3.5–5.2)
Alkaline Phosphatase: 108 U/L (ref 39–117)
BUN: 7 mg/dL (ref 6–23)
CHLORIDE: 101 meq/L (ref 96–112)
CO2: 22 mEq/L (ref 19–32)
CREATININE: 0.59 mg/dL (ref 0.50–1.10)
Calcium: 9.4 mg/dL (ref 8.4–10.5)
GFR calc Af Amer: >90 mL/min (ref >90)
Glucose, Bld: 89 mg/dL (ref 70–99)
Potassium: 4.2 mEq/L (ref 3.7–5.3)
Sodium: 138 mEq/L (ref 137–147)
Total Protein: 8.6 g/dL — ABNORMAL HIGH (ref 6.0–8.3)

## 2013-10-26 LAB — URINALYSIS, ROUTINE W REFLEX MICROSCOPIC
BILIRUBIN URINE: NEGATIVE
Glucose, UA: NEGATIVE mg/dL
Hgb urine dipstick: NEGATIVE
Ketones, ur: NEGATIVE mg/dL
Leukocytes, UA: NEGATIVE
Nitrite: NEGATIVE
PH: 6.5 (ref 5.0–8.0)
Protein, ur: NEGATIVE mg/dL
SPECIFIC GRAVITY, URINE: 1.012 (ref 1.005–1.030)
UROBILINOGEN UA: 0.2 mg/dL (ref 0.0–1.0)

## 2013-10-26 LAB — LIPASE, BLOOD: Lipase: 13 U/L (ref 11–59)

## 2013-10-26 LAB — I-STAT TROPONIN, ED: TROPONIN I, POC: 0 ng/mL (ref 0.00–0.08)

## 2013-10-26 MED ORDER — SODIUM CHLORIDE 0.9 % IV BOLUS (SEPSIS)
1000.0000 mL | Freq: Once | INTRAVENOUS | Status: AC
  Administered 2013-10-26: 1000 mL via INTRAVENOUS

## 2013-10-26 MED ORDER — PROMETHAZINE HCL 25 MG/ML IJ SOLN
12.5000 mg | Freq: Once | INTRAMUSCULAR | Status: AC
  Administered 2013-10-26: 12.5 mg via INTRAVENOUS
  Filled 2013-10-26 (×2): qty 1

## 2013-10-26 MED ORDER — ONDANSETRON 8 MG PO TBDP
8.0000 mg | ORAL_TABLET | Freq: Once | ORAL | Status: AC
  Administered 2013-10-26: 8 mg via ORAL
  Filled 2013-10-26: qty 1

## 2013-10-26 MED ORDER — HYDROMORPHONE HCL PF 1 MG/ML IJ SOLN
1.0000 mg | Freq: Once | INTRAMUSCULAR | Status: AC
  Administered 2013-10-26: 1 mg via INTRAVENOUS
  Filled 2013-10-26: qty 1

## 2013-10-26 NOTE — ED Notes (Signed)
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 Denies fever.

## 2013-10-27 MED ORDER — HYDROMORPHONE HCL PF 1 MG/ML IJ SOLN
1.0000 mg | Freq: Once | INTRAMUSCULAR | Status: AC
  Administered 2013-10-27: 1 mg via INTRAVENOUS
  Filled 2013-10-27: qty 1

## 2013-10-27 MED ORDER — OXYCODONE-ACETAMINOPHEN 5-325 MG PO TABS: 1.0000 | ORAL_TABLET | ORAL | Status: DC | PRN

## 2013-10-27 MED ORDER — PROMETHAZINE HCL 25 MG PO TABS: 25.0000 mg | ORAL_TABLET | Freq: Four times a day (QID) | ORAL | Status: DC | PRN

## 2013-10-27 NOTE — Discharge Instructions (Signed)
Clear liquid diet for at least 48hrs. Pain medications as prescribed. Lab work is normal today. Follow up with primary care doctor.   Diet The clear liquid diet consists of foods that are liquid or will become liquid at room temperature. Examples of foods allowed on a clear liquid diet include fruit juice, broth or bouillon, gelatin, or frozen ice pops. You should be able to see through the liquid. The purpose of this diet is to provide the necessary fluids, electrolytes (such as sodium and potassium), and energy to keep the body functioning during times when you are not able to consume a regular diet. A clear liquid diet should not be continued for long periods of time, as it is not nutritionally adequate.  A CLEAR LIQUID DIET MAY BE NEEDED:  When a sudden-onset (acute) condition occurs before or after surgery.   As the first step in oral feeding.   For fluid and electrolyte replacement in diarrheal diseases.   As a diet before certain medical tests are performed.  ADEQUACY The clear liquid diet is adequate only in ascorbic acid, according to the Recommended Dietary Allowances of the [employer].  CHOOSING FOODS Breads and Starches  Allowed: None are allowed.   Avoid: All are to be avoided.  Vegetables  Allowed: Strained vegetable juices.   Avoid: Any others.  Fruit  Allowed: Strained fruit juices and fruit drinks. Include 1 serving of citrus or vitamin C-enriched fruit juice daily.   Avoid: Any others.  Meat and Meat Substitutes  Allowed: None are allowed.   Avoid: All are to be avoided.  Milk Products  Allowed: None are allowed.   Avoid: All are to be avoided.  Soups and Combination Foods  Allowed: Clear bouillon, broth, or strained broth-based soups.   Avoid: Any others.  Desserts and Sweets  Allowed: Sugar, honey. High-protein gelatin. Flavored gelatin, ices, or frozen ice pops that do not contain milk.    Avoid: Any others.  Fats and Oils  Allowed: None are allowed.   Avoid: All are to be avoided.  Beverages  Allowed: Cereal beverages, coffee (regular or decaffeinated), tea, or soda at the discretion of your health care provider.   Avoid: Any others.  Condiments  Allowed: Salt.   Avoid: Any others, including pepper.  Supplements  Allowed: Liquid nutrition beverages that you can see through.   Avoid: Any others that contain lactose or fiber. SAMPLE MEAL PLAN Breakfast  4 oz (120 mL) strained orange juice.   to 1 cup (120 to 240 mL) gelatin (plain or fortified).  1 cup (240 mL) beverage (coffee or tea).  Sugar, if desired. Midmorning Snack   cup (120 mL) gelatin (plain or fortified). Lunch  1 cup (240 mL) broth or consomm.  4 oz (120 mL) strained grapefruit juice.   cup (120 mL) gelatin (plain or fortified).  1 cup (240 mL) beverage (coffee or tea).  Sugar, if desired. Midafternoon Snack   cup (120 mL) fruit ice.   cup (120 mL) strained fruit juice. Dinner  1 cup (240 mL) broth or consomm.   cup (120 mL) cranberry juice.   cup (120 mL) flavored gelatin (plain or fortified).  1 cup (240 mL) beverage (coffee or tea).  Sugar, if desired. Evening Snack  4 oz (120 mL) strained apple juice (vitamin C-fortified).   cup (120 mL) flavored gelatin (plain or fortified). MAKE SURE YOU:  Understand these instructions.  Will watch your child's condition.  Will get help right away if your  child is not doing well or gets worse. Document Released: 08/04/2005 Document Revised: 04/06/2013 Document Reviewed: 01/04/2013 [facility] Patient Information 2014 [place], [place]. Acute Pancreatitis Acute pancreatitis is a disease in which the pancreas becomes suddenly inflamed. The pancreas is a large gland located behind your stomach. The pancreas produces enzymes that help digest food. The pancreas also releases the hormones glucagon and  insulin that help regulate blood sugar. Damage to the pancreas occurs when the digestive enzymes from the pancreas are activated and begin attacking the pancreas before being released into the intestine. Most acute attacks last a couple of days and can cause serious complications. Some people become dehydrated and develop low blood pressure. In severe cases, bleeding into the pancreas can lead to shock and can be life-threatening. The lungs, heart, and kidneys may fail. CAUSES  Pancreatitis can happen to anyone. In some cases, the cause is unknown. Most cases are caused by:  Alcohol abuse.  Gallstones. Other less common causes are:  Certain medicines.  Exposure to certain chemicals.  Infection.  Damage caused by an accident (trauma).  Abdominal surgery. SYMPTOMS   Pain in the upper abdomen that may radiate to the back.  Tenderness and swelling of the abdomen.  Nausea and vomiting. DIAGNOSIS  Your caregiver will perform a physical exam. Blood and stool tests may be done to confirm the diagnosis. Imaging tests may also be done, such as X-rays, CT scans, or an ultrasound of the abdomen. TREATMENT  Treatment usually requires a stay in the hospital. Treatment may include:  Pain medicine.  Fluid replacement through an intravenous line (IV).  Placing a tube in the stomach to remove stomach contents and control vomiting.  Not eating for 3 or 4 days. This gives your pancreas a rest, because enzymes are not being produced that can cause further damage.  Antibiotic medicines if your condition is caused by an infection.  Surgery of the pancreas or gallbladder. HOME CARE INSTRUCTIONS   Follow the diet advised by your caregiver. This may involve avoiding alcohol and decreasing the amount of fat in your diet.  Eat smaller, more frequent meals. This reduces the amount of digestive juices the pancreas produces.  Drink enough fluids to keep your urine clear or pale yellow.  Only take  over-the-counter or prescription medicines as directed by your caregiver.  Avoid drinking alcohol if it caused your condition.  Do not smoke.  Get plenty of rest.  Check your blood sugar at home as directed by your caregiver.  Keep all follow-up appointments as directed by your caregiver. SEEK MEDICAL CARE IF:   You do not recover as quickly as expected.  You develop new or worsening symptoms.  You have persistent pain, weakness, or nausea.  You recover and then have another episode of pain. SEEK IMMEDIATE MEDICAL CARE IF:   You are unable to eat or keep fluids down.  Your pain becomes severe.  You have a fever or persistent symptoms for more than 2 to 3 days.  You have a fever and your symptoms suddenly get worse.  Your skin or the white part of your eyes turn yellow (jaundice).  You develop vomiting.  You feel dizzy, or you faint.  Your blood sugar is high (over 300 mg/dL). MAKE SURE YOU:   Understand these instructions.  Will watch your condition.  Will get help right away if you are not doing well or get worse. Document Released: 08/04/2005 Document Revised: 02/03/2012 Document Reviewed: 11/13/2011 [facility] Patient Information 2014 [place], [place].  Emergency Department Resource Guide 1) Find a Doctor and Pay Out of Pocket Although you won't have to find out who is covered by your insurance plan, it is a good idea to ask around and get recommendations. You will then need to call the office and see if the doctor you have chosen will accept you as a new patient and what types of options they offer for patients who are self-pay. Some doctors offer discounts or will set up payment plans for their patients who do not have insurance, but you will need to ask so you aren't surprised when you get to your appointment.  2) Contact Your Local Health Department Not all health departments have doctors that can see patients for sick visits, but many do, so it is worth a call  to see if yours does. If you don't know where your local health department is, you can check in your phone book. The CDC also has a tool to help you locate your state's health department, and many state websites also have listings of all of their local health departments.  3) Find a Walk-in Clinic If your illness is not likely to be very severe or complicated, you may want to try a walk in clinic. These are popping up all over the country in pharmacies, drugstores, and shopping centers. They're usually staffed by nurse practitioners or physician assistants that have been trained to treat common illnesses and complaints. They're usually fairly quick and inexpensive. However, if you have serious medical issues or chronic medical problems, these are probably not your best option.  No Primary Care Doctor: - Call Health Connect at  [phone] - they can help you locate a primary care doctor that  accepts your insurance, provides certain services, etc. - Physician Referral Service- [phone]  Chronic Pain Problems: Organization         Address  Phone   Notes  [facility]  [phone] Patients need to be referred by their primary care doctor.   Medication Assistance: Organization         Address  Phone   Notes  [facility] Medication [facility] [address] [place], [place] [address] [phone] --Must be a resident of [facility] -- Must have NO insurance coverage whatsoever (no Medicaid/ Medicare, etc.) -- The pt. MUST have a primary care doctor that directs their care regularly and follows them in the community   [facility]  [phone]   [employer]  [phone]    Agencies that provide inexpensive medical care: Organization         Address  Phone   Notes  [facility]  [phone]   [facility]    [phone]   [facility] [address] [place], [place] [address] [phone]   [facility] [address] [place] [place] [phone]   Planned Parenthood    [phone]   [facility]    [phone]   [facility]  [address], [place] Phone:  [phone], Fax:  [phone] Hours of Operation:  9 am - 6 pm, M-F.  Also accepts Medicaid/Medicare and self-pay.  [facility]  [address], [place] Phone: [phone], Fax: [phone]. Hours of Operation:  8:30 am - 5:30 pm, M-F.  Also accepts Medicaid and self-pay.  [facility] [place]  [address], [place] Phone: [phone]   [facility] [address], [place], [place] [phone], Ext. [phone] Mondays & Thursdays: 7-9 AM.  First 15 patients are seen on a first come, first serve basis.    Medicaid-accepting [facility] Providers:  Organization         Address  Phone   Notes  [facility] [address], [place] [phone] Also accepts self-pay patients.  [facility] [address], [place]  [phone](336) [phone]   [facility] [address], [place] [phone](336) [phone]   [facility] [address] [place] [address], [place] [phone]) [phone]   Renaye RakersVeita Bland [address], [place]   [phone] Only accepts [place] Access [place] patients after they have their name applied to their card.   Self-Pay (no insurance) in [facility]:  Organization         Address  Phone   Notes  Sickle Cell Patients, [facility] [address], [place] [phone]   [facility] [address], [place] [phone](336) [phone]   [facility] [place]  [address], [place] [phone]) [phone]   [facility]/Dr. Osei-Bonsu  [address] [place] [address], [place] or [phone], [place] [phone] Phone number for both [place] and [place] locations is the same.  [facility] [address], [place] [phone]) [phone]   [facility] [place] [address] [place] [address], [place] or [address] [phone]   [facility] [address], [place] [phone]) [phone], phone; [phone]) [phone], fax Sees patients 1st and 3rd Saturday of every month.  Must not qualify for public or private insurance (i.e. Medicaid, Medicare, [place] Health Choice, Veterans' Benefits)  Household income should be no more than 200% of the poverty level The clinic cannot treat you if you are pregnant or think you are pregnant  Sexually transmitted diseases are not treated at the clinic.    Dental Care: Organization         Address  Phone  Notes  [facility] of [facility] [address], [place] [phone] Accepts children up to age 37 who are enrolled in [place] or [place] Health Choice; pregnant women with a Medicaid card; and children who have applied for Medicaid or [place] Health Choice, but were declined, whose parents can pay a reduced fee at time of service.  [facility] of [facility] [place]  [address], [place] [phone] Accepts children up to age 37 who are enrolled in [place] or [place] Health Choice; pregnant women with a Medicaid card; and children who have applied for Medicaid or [place] Health Choice, but were declined, whose parents can pay a reduced fee at time of service.  [facility]  [address], [place] [phone] Patients are seen by appointment only. Walk-ins are not accepted. [facility] will see patients 37 years of age and older. Monday - Tuesday (8am-5pm) Most Wednesdays (8:30-5pm) $30 per visit, cash only  [facility]  [address], [facility] [phone] Patients are  seen by appointment only. Walk-ins are not accepted. [facility] will see patients 37 years of age and older.  One Wednesday Evening (Monthly: Volunteer Based).  $30 per visit, cash only  [place]  [phone] for adults; Children under age 79, call Graduate Pediatric Dentistry at [phone]. Children aged 8-14, please call [phone] to request a pediatric application.  Dental services are provided in all areas of dental care including fillings, crowns and bridges, complete and partial dentures, implants, gum treatment, root canals, and extractions. Preventive care is also provided. Treatment is provided to both adults and children. Patients are selected via a lottery and there is often a waiting list.   [facility] [address] [place]  [phone] [email or web address]   [facility] [address] [place], [place] [phone], Ext. [phone] Second and Fourth Thursday of each month, opens at 6:30 AM; Clinic ends at 9 AM.  Patients are seen on a first-come first-served basis, and a limited number are seen during each clinic.   [facility]  [address] Hillard Danker [place], [place] [phone]   Eligibility Requirements You must have lived in [place], [place], or [place] counties for at least the last three months.   You cannot be eligible for state or federal sponsored [employer], including [employer], [place], or [employer].   You generally cannot be eligible for healthcare insurance through your employer.    How to apply: Eligibility screenings are held every Tuesday and Wednesday afternoon from 1:00 pm until 4:00 pm. You do not need an appointment for the interview!  [facility] [address], [place], [place]   [place]  Com[place] Department  [place]  [phone]     Behavioral Health Resources in the Community: Intensive Outpatient Programs Organization         Address  Phone  Notes  [place]. [address], [place], [place] [phone]   [facility] [address] [place], [place]   ADS: [facility] Drug [facility] [address], [place], [place]   [place] [address]  [place], [place] or [phone]   Substance Abuse Resources Organization         Address  Phone  Notes  [facility] Drug Services  [phone]   [place]  [phone]   The [place]   Chinita Pester  [phone]   Residential & Outpatient Substance Abuse Program  2106207615   Psychological Services Organization         Address  Phone  Notes  [facility] [place]  [place]  [phone]   [place] [address] [place] or [phone]    Mobile Crisis Teams Organization         Address  Phone  Notes  [facility], Mobile Crisis Care Unit  [phone]   [facility]  [address] [place], [place]   [place] Levels [address] [place] [phone]    Self-Help/Support Groups Organization         Address  Phone             Notes  [place]. of [place] - variety of support groups  [place] Call for more information  Narcotics Anonymous (NA), [facility] [address], [employer] [place]   2 meetings at this location  Residential Treatment Programs Organization         Address  Phone  Notes  [facility] [address],    [place]  [phone]   [facility]  [address], [place] [address]35, [place], [place]   [place], [place] [phone] Admissions: 8am-3pm M-F  Incentives  Substance [place] [address],    [place], [place] [phone]   [facility] [address] [place], [place], [place]   [facility] [address],  [place], [place]   [facility] - Intensive Outpatient [address], [place], [place]   [facility] ([place].) [place].,  [place], [place] [phone] or [phone]   [facility] (RTS) [address], [place], [place] Accepts Medicaid  [facility] [place] [address]  [place] [phone] Substance Abuse/Addiction Treatment   [facility] Organization         Address  Phone  Notes  [facility]  [phone]   Domenic Schwab, PhD [address]lis [place] [place], [place]   [phone] or [phone]   [place], [place] [phone]   [facility] [address], [place], [place] [phone] Insurance/Medicaid/sponsorship through [facility] and Families [address] [place]                                    [place], [place] [phone] [place] [address], [place] [phone]    Dr. Adele Schilder  [phone]   [facility] Dept. 1) [address], [place] 2) [place] 3)  [place] [address], [place] [phone]  [phone]   [place] [phone] or [phone] (After Hours)

## 2013-10-27 NOTE — ED Provider Notes (Signed)
Medical screening examination/treatment/procedure(s) were performed by non-physician practitioner and as supervising physician I was immediately available for consultation/collaboration.   EKG Interpretation None       Ethelda Chick, MD 10/27/13 1511

## 2013-10-27 NOTE — ED Provider Notes (Signed)
CSN: [id number]     Arrival date & time 10/26/13  1838 History   First MD Initiated Contact with Patient 10/26/13 2159     Chief Complaint  Patient presents with  . Abdominal Pain     (Consider location/radiation/quality/duration/timing/severity/associated sxs/prior Treatment) HPI Mary [name] is a 38 y.o. female presents to emergency department complaining of abdominal pain, nausea, vomiting. Patient states that her symptoms began this afternoon. Admits to also having loose stools. Patient states she has history of idiopathic pancreatitis, roux-en-y procedure, adrenal insufficiency, MS. patient states this pain feels similar to her pancreatitis. She did not take any medications at home prior to coming in. She  has not taken any of her regular medications either, states unable to keep anything down. She denies any fever, chills. She denies any blood in her emesis or stool. She denies any other complaints.   Past Medical History  Diagnosis Date  . Hernia 03-24-13    ventral hernia remains   . GERD (gastroesophageal reflux disease)   . Pancreatitis 03-24-13    2002, 2'2013, 03-14-13  . Pancreatic cyst 2002 onset  . Anxiety   . Depression     "recent breakup with partner of 15 yrs"  . Adrenal insufficiency 04/23/2013    ??  . MS (multiple sclerosis)    Past Surgical History  Procedure Laterality Date  . Abdominal surgery  ~ 2007    some sort of pancreatic cyst drainage.   . Cholecystectomy      ~ age 16-laparoscopic  . Adenoidectomy    . 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 Eus N/A 04/14/2013    Procedure: UPPER ENDOSCOPIC ULTRASOUND (EUS) LINEAR;  Surgeon: Rachael Feeaniel P Jacobs, MD;  Location: [facility];  Service: Endoscopy;  Laterality: N/A;   Family History  Problem Relation Age of Onset  . Lupus Neg Hx   . Sarcoidosis Neg Hx   . Pancreatitis Neg Hx   . Ataxia Neg Hx   . Chorea Neg Hx   . Dementia Neg Hx   . Mental  retardation Neg Hx   . Migraines Neg Hx   . Multiple sclerosis Neg Hx   . Neurofibromatosis Neg Hx   . Neuropathy Neg Hx   . Parkinsonism Neg Hx   . Seizures Neg Hx   . Stroke Neg Hx   . Colitis Maternal Aunt   . Diabetes Mother   . Diabetes Father   . Diabetes      many family members   History  Substance Use Topics  . Smoking status: Former Smoker -- 0.50 packs/day for 20 years    Types: Cigarettes    Quit date: 05/26/2013  . Smokeless tobacco: Never Used  . Alcohol Use: No   OB History   Grav Para Term Preterm Abortions TAB SAB Ect Mult Living                 Review of Systems  Constitutional: Negative for fever and chills.  Respiratory: Negative for cough, chest tightness and shortness of breath.   Cardiovascular: Negative for chest pain, palpitations and leg swelling.  Gastrointestinal: Positive for nausea, vomiting, abdominal pain and diarrhea. Negative for constipation, blood in stool and abdominal distention.  Genitourinary: Negative for dysuria, flank pain, vaginal bleeding, vaginal discharge, vaginal pain and pelvic pain.  Musculoskeletal: Negative for arthralgias, myalgias, neck pain and neck stiffness.  Skin: Negative for rash.  Neurological: Negative for dizziness,  weakness and headaches.  All other systems reviewed and are negative.      Allergies  Penicillins; Latex; and Morphine and related  Home Medications   Current Outpatient Rx  Name  Route  Sig  Dispense  Refill  . cholecalciferol (VITAMIN D) 1000 UNITS tablet   Oral   Take 5,000 Units by mouth daily.         Marland Kitchen Glatiramer Acetate (COPAXONE) 40 MG/ML SOSY   Subcutaneous   Inject 1 Syringe into the skin 3 (three) times a week.   12 Syringe   0     Samples of this drug were given to the patient, qu ...   . lipase/protease/amylase (CREON-12/PANCREASE) 12000 UNITS CPEP capsule   Oral   Take 2 capsules by mouth 3 (three) times daily with meals.   180 capsule   2   . pantoprazole  (PROTONIX) 40 MG tablet   Oral   Take 40 mg by mouth daily.          BP 135/77  Pulse 93  Temp(Src) 97.9 F (36.6 C) (Oral)  Resp 20  Ht 5' (1.524 m)  Wt 245 lb (111.131 kg)  BMI 47.85 kg/m2  SpO2 95%  LMP 09/28/2013 Physical Exam  Nursing note and vitals reviewed. Constitutional: She appears well-developed and well-nourished. No distress.  HENT:  Head: Normocephalic.  Eyes: Conjunctivae are normal.  Neck: Neck supple.  Cardiovascular: Normal rate, regular rhythm and normal heart sounds.   Pulmonary/Chest: Effort normal and breath sounds normal. No respiratory distress. She has no wheezes. She has no rales.  Abdominal: Soft. Bowel sounds are normal. She exhibits no distension. There is tenderness. There is no rebound.  A right upper quadrant, epigastric, left upper quadrant tenderness. No guarding.  Musculoskeletal: She exhibits no edema.  Neurological: She is alert.  Skin: Skin is warm and dry.  Psychiatric: She has a normal mood and affect. Her behavior is normal.    ED Course  Procedures (including critical care time) Labs Review Labs Reviewed  COMPREHENSIVE METABOLIC PANEL - Abnormal; Notable for the following:    Total Protein 8.6 (*)    Total Bilirubin <0.2 (*)    All other components within normal limits  CBC WITH DIFFERENTIAL  LIPASE, BLOOD  URINALYSIS, ROUTINE W REFLEX MICROSCOPIC  I-STAT TROPOININ, ED   Imaging Review No results found.   EKG Interpretation None      MDM   Final diagnoses:  Abdominal pain   Pt with hx of pancreatitis, abdominal surgeries, here with upper abdominal pain similar to prior pancreatitis. Labs ordered. No signs of obstruction, having bowl movements, no distention.   12:08 AM reassessed. Pain down to 6-7/10. Will try another dose of pain medications. Patient's blood work is completely normal including her white blood count and her lipase. We did review patient's chart, and last admission for this abdominal pain. It  appeared at that time, pt was requesting large doses of IV pain medications, with no exam findings or lab findings to suggest an acute process. At this time, given pt's exam and lab work, and normal VS, will try to manage pain in ED, and d/c home with outpatient follow up.     12:50 AM Pain and nausea significantly improved. Given normal vital signs, normal lab work, no acute abdomen on exam, will try treatment at home. Patient struck his a stick with clear liquids. I will give her prescription for oxycodone and Phenergan based on her request. Patient instructed to follow primary  care doctor, return if her symptoms are worsening. Patient voiced understanding.  Filed Vitals:   10/26/13 1920 10/26/13 2157 10/27/13 0020  BP: 139/68 135/77 106/62  Pulse: 56 93 72  Temp: 97.9 F (36.6 C)  97.6 F (36.4 C)  TempSrc: Oral  Oral  Resp: 16 20 20   Height: 5' (1.524 m)    Weight: 245 lb (111.131 kg)    SpO2: 63% 95% 96%     Lottie Mussel, PA-C 10/27/13 0051

## 2013-11-03 ENCOUNTER — Telehealth: Payer: Self-pay | Admitting: Neurology

## 2013-11-03 ENCOUNTER — Other Ambulatory Visit: Payer: Self-pay | Admitting: *Deleted

## 2013-11-03 NOTE — Telephone Encounter (Signed)
This patient is asking if you would be willing to fill out disability papers for her ? Please let me know

## 2013-11-03 NOTE — Telephone Encounter (Signed)
Pt is requesting to speak to a nurse. She would not state her reasoning.  Please call Pt back.

## 2013-11-04 NOTE — Telephone Encounter (Signed)
I spoke with patient  She is requesting a disability form from Dr Everlena Cooper for her MS issues she states she is very tired with RT leg weakness it is very difficult to put on shoes socks and to use stairs  Due to the limitations of this leg . She is also having numbness and tingling in hands . I explained to her that there is a $25.00 charge  For the forms to be filled out she will bring them in as soon as possible

## 2013-11-04 NOTE — Telephone Encounter (Signed)
These are all new symptoms in just a short time.  She had mild leg weakness before, which was stable for quite some time, and not debilitating.  If this is an acute change, I would have to re-evaluate her and possibly repeat imaging.

## 2013-11-17 ENCOUNTER — Encounter: Payer: Self-pay | Admitting: Neurology

## 2013-11-17 ENCOUNTER — Ambulatory Visit (INDEPENDENT_AMBULATORY_CARE_PROVIDER_SITE_OTHER): Payer: Self-pay | Admitting: Neurology

## 2013-11-17 VITALS — BP 132/70 | HR 68 | Resp 18 | Ht 64.0 in | Wt 249.1 lb

## 2013-11-17 DIAGNOSIS — G35 Multiple sclerosis: Secondary | ICD-10-CM

## 2013-11-17 DIAGNOSIS — G379 Demyelinating disease of central nervous system, unspecified: Secondary | ICD-10-CM

## 2013-11-17 MED ORDER — MODAFINIL 200 MG PO TABS: 200.0000 mg | ORAL_TABLET | ORAL | Status: DC

## 2013-11-17 NOTE — Progress Notes (Signed)
NEUROLOGY FOLLOW UP OFFICE NOTE  Mary [name] [id number]  HISTORY OF PRESENT ILLNESS: Mary [name] is a 38 year old right-handed woman with history of recurrent pancreatitis, pancreatic cyst status post stent placement in 2007 which became infected and removed, cyst-SBO anastomosis 2007, status post cholecystectomy, and GERD who follows up for possible multiple sclerosis.  She is accompanied by her mother.  Records and images were personally reviewed where available.    UPDATE: In January, she was started on Copaxone.  She was also advised to start D3 4000 IU daily.  At first, she was experiencing dizziness but thought it may have been due to stress rather than the Copaxone.  The Copaxone nurse came back and told her she wasn't injecting deep enough.  She also reports that she has increased problems with her right leg, that causes her to stumble.  It is intermittent and doesn't happen all the time.  She notes "joint" pain, noticeable in the right elbow, back, right hip and knee.  Sometimes it is difficult to cross her right leg on her left knee.  She also notes increased fatigue.  She will just feel tired often, but on certain days, approximately 8 to 10 days out of the month, she reports that her entire body just feels "heavy" and she is unable to get out of bed.  She does report increased stress and anxiety over the past 2 months regarding everything going on, as well as stress at home.  She and her mother live with her brother's three children, ages 58, 17 and 59.  She reports that her words sometimes gets tangled up but it may be related to anxiety.  Since moving to [place] from [place], she has not been working, particularly because she required multiple hospitalizations for her nausea and vomiting.  She also reports occasional neck stiffness and intermittent numbness of her right hand, which causes her to drop things.  HISTORY: She has a history of recurrent pancreatitis with pancreatic  cyst of unknown etiology.  She was admitted to the hospital in late-August and early-September for pancreatitis associated with nausea, vomiting and diarrhea.  She underwent a CT of the head on 04/22/13 to rule out intracranial cause of nausea and vomiting, which revealed extensive hypodensity in the bilateral primarily subcortical white matter.  A follow up MRI of the brain with and without contrast performed on 04/22/13 revealed extensive white matter disease without abnormal enhancement.  She had no prior imaging to compare.  She underwent a lumbar puncture, which revealed protein 25, glucose 69, cell count 2, negative gram stain, IgG 3.3, and positive OCBs (3).  Serum B12 was 423.  ANA was positive (1:160, speckled pattern), ESR 47, RF <10, Lyme abs negative, Myeloperoxidase/serine protease 3 abs negative, antiproteinase 3 abs negative, SSA/SSB abs negative, vitamin D 25 hydroxy 20.  Repeat MRI of brain with and without contrast performed on 09/06/13 was stable with no new or enhancing lesions.  MRI of cervical spine with and without contrast was negative.  In retrospect, she reports transient numbness in the hands (left worse than right), as well as transient numbness in the right leg with occasional weakness of the right leg and stumbling.  The mild right hip weakness has been chronic, mild and unchanged for quite some time.  She denies prior history of vision loss or ocular pain.  She denies history of pain down the spine.    She has an occasional migraine but nothing severe.  She denies family  history of neurological conditions.  PAST MEDICAL HISTORY: Past Medical History  Diagnosis Date  . Hernia 03-24-13    ventral hernia remains   . GERD (gastroesophageal reflux disease)   . Pancreatitis 03-24-13    2002, 2'2013, 03-14-13  . Pancreatic cyst 2002 onset  . Anxiety   . Depression     "recent breakup with partner of 15 yrs"  . Adrenal insufficiency 04/23/2013    ??  . MS (multiple sclerosis)      MEDICATIONS: Current Outpatient Prescriptions on File Prior to Visit  Medication Sig Dispense Refill  . cholecalciferol (VITAMIN D) 1000 UNITS tablet Take 5,000 Units by mouth daily.      Marland Kitchen Glatiramer Acetate (COPAXONE) 40 MG/ML SOSY Inject 1 Syringe into the skin 3 (three) times a week.  12 Syringe  0  . lipase/protease/amylase (CREON-12/PANCREASE) 12000 UNITS CPEP capsule Take 2 capsules by mouth 3 (three) times daily with meals.  180 capsule  2  . oxyCODONE-acetaminophen (PERCOCET/ROXICET) 5-325 MG per tablet Take 1 tablet by mouth every 4 (four) hours as needed for severe pain.  20 tablet  0  . pantoprazole (PROTONIX) 40 MG tablet Take 40 mg by mouth daily.      . promethazine (PHENERGAN) 25 MG tablet Take 1 tablet (25 mg total) by mouth every 6 (six) hours as needed for nausea or vomiting.  15 tablet  0   No current facility-administered medications on file prior to visit.    ALLERGIES: Allergies  Allergen Reactions  . Penicillins Anaphylaxis  . Latex Itching  . Morphine And Related Nausea And Vomiting    FAMILY HISTORY: Family History  Problem Relation Age of Onset  . Lupus Neg Hx   . Sarcoidosis Neg Hx   . Pancreatitis Neg Hx   . Ataxia Neg Hx   . Chorea Neg Hx   . Dementia Neg Hx   . Mental retardation Neg Hx   . Migraines Neg Hx   . Multiple sclerosis Neg Hx   . Neurofibromatosis Neg Hx   . Neuropathy Neg Hx   . Parkinsonism Neg Hx   . Seizures Neg Hx   . Stroke Neg Hx   . Colitis Maternal Aunt   . Diabetes Mother   . Diabetes Father   . Diabetes      many family members    SOCIAL HISTORY: History   Social History  . Marital Status: Single    Spouse Name: N/A    Number of Children: N/A  . Years of Education: N/A   Occupational History  . Not on file.   Social History Main Topics  . Smoking status: Former Smoker -- 0.50 packs/day for 20 years    Types: Cigarettes    Quit date: 05/26/2013  . Smokeless tobacco: Never Used  . Alcohol Use: No   . Drug Use: No  . Sexual Activity: Not Currently   Other Topics Concern  . Not on file   Social History Narrative   Lives permanently in [place] with mom and stepfather.  Has not started working yet and was unemployed in [place].             REVIEW OF SYSTEMS: Constitutional:  Fatigue No fevers, chills, or sweats, change in appetite Eyes: No visual changes, double vision, eye pain Ear, nose and throat: No hearing loss, ear pain, nasal congestion, sore throat Cardiovascular: No chest pain, palpitations Respiratory:  No shortness of breath at rest or with exertion, wheezes GastrointestinaI: Occasional nausea Genitourinary:  No dysuria, urinary retention or frequency Musculoskeletal:  Back pain, neck stiffness, right hip pain, right knee pain. Integumentary: No rash, pruritus, skin lesions Neurological: as above Psychiatric: Stress, anxiety Endocrine: Fatigue, No diaphoresis, mood swings, change in appetite, change in weight, increased thirst Hematologic/Lymphatic:  No anemia, purpura, petechiae. Allergic/Immunologic: no itchy/runny eyes, nasal congestion, recent allergic reactions, rashes  PHYSICAL EXAM: Filed Vitals:   11/17/13 0753  BP: 132/70  Pulse: 68  Resp: 18   General: No acute distress Head:  Normocephalic/atraumatic Neck: supple, very mild right paraspinal tenderness, full range of motion Heart:  Regular rate and rhythm Lungs:  Clear to auscultation bilaterally Back: mild right paraspinal tenderness Neurological Exam: alert and oriented to person, place, and time. Attention span and concentration intact, recent and remote memory intact, fund of knowledge intact.  Able to recall 3 words after a couple of minutes.  Able to spell WORLD backwards, able to copy intersecting pentagons and draw a clock correctly to requested time.  Follows 3 step commands across midline.  Speech fluent and not dysarthric, able to name, repeat, read, and write.  MMSE 30/30.  Reduced hearing  on the left, otherwise CN II-XII intact. Fundoscopic exam unremarkable without vessel changes, exudates, hemorrhages or papilledema.  Bulk and tone normal, muscle strength 5/5 throughout.  Endorses reduced pinprick sensation over the dorsum of the right hand, right upper lateral arm, and medial right lower leg.  Otherwise sensation to light touch, temperature and vibration intact.  Deep tendon reflexes 2+ throughout, toes downgoing.  Finger to nose and heel to shin testing intact.  Gait with normal station and stride.  Able to turn, walk on toes, heels and in tandem. Romberg with very mild sway.  Tenderness to palpation of the right hip.  IMPRESSION: Demyelinating disease, possibly multiple sclerosis.  In retrospect, she reports history of numbness in the hands and right leg.  This may be related to either carpal tunnel, arthritic hip joint problems and or radiculopathy rather than a flare-up, but I cannot be sure.  They presented me with a form for disability parking permit, which I don't feel is indicated.  They will present me with disability papers to fill out and I will fill them out based on my clinical findings.  PLAN: 1.  Continue Copaxone. 2.  For fatigue, we will start Provigil 270m every morning.  Side effects discussed. 3.  Repeat MRI of brain with and without contrast in 3 months with follow up soon after.  30 minutes spent with patient and her mother, over 50% spent counseling and coordinating care.  AMetta Clines DO

## 2013-11-17 NOTE — Patient Instructions (Addendum)
1.  Get me the papers to be filled out 2.  Continue Copaxone.  Ask nurse all potential sites for injections 3.  Try Provigil 200mg , 1 tablet every morning to help with fatigue 4.  Follow up in 3 months with repeat MRI before that.  [facility] 02/20/14 at 6:40pm  Call 3 days ahead to office  for valium to be called in

## 2013-11-29 ENCOUNTER — Emergency Department (HOSPITAL_COMMUNITY): Payer: Medicaid Other

## 2013-11-29 ENCOUNTER — Encounter (HOSPITAL_COMMUNITY): Payer: Self-pay | Admitting: Emergency Medicine

## 2013-11-29 ENCOUNTER — Emergency Department (HOSPITAL_COMMUNITY)
Admission: EM | Admit: 2013-11-29 | Discharge: 2013-11-29 | Disposition: A | Discharge status: Home / Self Care | Payer: Medicaid Other | Attending: Emergency Medicine | Admitting: Emergency Medicine

## 2013-11-29 DIAGNOSIS — Z8639 Personal history of other endocrine, nutritional and metabolic disease: Secondary | ICD-10-CM | POA: Insufficient documentation

## 2013-11-29 DIAGNOSIS — G35 Multiple sclerosis: Secondary | ICD-10-CM | POA: Diagnosis not present

## 2013-11-29 DIAGNOSIS — Z87891 Personal history of nicotine dependence: Secondary | ICD-10-CM | POA: Insufficient documentation

## 2013-11-29 DIAGNOSIS — M546 Pain in thoracic spine: Secondary | ICD-10-CM | POA: Diagnosis present

## 2013-11-29 DIAGNOSIS — R52 Pain, unspecified: Secondary | ICD-10-CM | POA: Diagnosis not present

## 2013-11-29 DIAGNOSIS — Z862 Personal history of diseases of the blood and blood-forming organs and certain disorders involving the immune mechanism: Secondary | ICD-10-CM | POA: Insufficient documentation

## 2013-11-29 DIAGNOSIS — Z8659 Personal history of other mental and behavioral disorders: Secondary | ICD-10-CM | POA: Insufficient documentation

## 2013-11-29 DIAGNOSIS — Z91040 Latex allergy status: Secondary | ICD-10-CM | POA: Diagnosis not present

## 2013-11-29 DIAGNOSIS — Z79899 Other long term (current) drug therapy: Secondary | ICD-10-CM | POA: Insufficient documentation

## 2013-11-29 DIAGNOSIS — G8929 Other chronic pain: Secondary | ICD-10-CM | POA: Diagnosis not present

## 2013-11-29 DIAGNOSIS — R0602 Shortness of breath: Secondary | ICD-10-CM | POA: Diagnosis not present

## 2013-11-29 DIAGNOSIS — Z880 Allergy status to penicillin: Secondary | ICD-10-CM | POA: Insufficient documentation

## 2013-11-29 DIAGNOSIS — K219 Gastro-esophageal reflux disease without esophagitis: Secondary | ICD-10-CM | POA: Diagnosis not present

## 2013-11-29 LAB — COMPREHENSIVE METABOLIC PANEL
ALT: 26 U/L (ref 0–35)
AST: 21 U/L (ref 0–37)
Albumin: 3.5 g/dL (ref 3.5–5.2)
Alkaline Phosphatase: 101 U/L (ref 39–117)
BUN: 11 mg/dL (ref 6–23)
CALCIUM: 9.1 mg/dL (ref 8.4–10.5)
CO2: 24 mEq/L (ref 19–32)
Chloride: 103 mEq/L (ref 96–112)
Creatinine, Ser: 0.57 mg/dL (ref 0.50–1.10)
GLUCOSE: 91 mg/dL (ref 70–99)
Potassium: 4.1 mEq/L (ref 3.7–5.3)
Sodium: 139 mEq/L (ref 137–147)
Total Bilirubin: <0.2 mg/dL — ABNORMAL LOW (ref 0.3–1.2)
Total Protein: 7.8 g/dL (ref 6.0–8.3)

## 2013-11-29 LAB — URINALYSIS, ROUTINE W REFLEX MICROSCOPIC
Bilirubin Urine: NEGATIVE
Glucose, UA: NEGATIVE mg/dL
Hgb urine dipstick: NEGATIVE
KETONES UR: NEGATIVE mg/dL
LEUKOCYTES UA: NEGATIVE
NITRITE: NEGATIVE
PH: 5.5 (ref 5.0–8.0)
Protein, ur: NEGATIVE mg/dL
SPECIFIC GRAVITY, URINE: 1.022 (ref 1.005–1.030)
UROBILINOGEN UA: 0.2 mg/dL (ref 0.0–1.0)

## 2013-11-29 LAB — CBC WITH DIFFERENTIAL/PLATELET
Basophils Absolute: 0 10*3/uL (ref 0.0–0.1)
Basophils Relative: 0 % (ref 0–1)
EOS PCT: 4 % (ref 0–5)
Eosinophils Absolute: 0.3 10*3/uL (ref 0.0–0.7)
HEMATOCRIT: 37.2 % (ref 36.0–46.0)
HEMOGLOBIN: 12.3 g/dL (ref 12.0–15.0)
LYMPHS ABS: 1.2 10*3/uL (ref 0.7–4.0)
Lymphocytes Relative: 15 % (ref 12–46)
MCH: 28 pg (ref 26.0–34.0)
MCHC: 33.1 g/dL (ref 30.0–36.0)
MCV: 84.5 fL (ref 78.0–100.0)
MONO ABS: 0.7 10*3/uL (ref 0.1–1.0)
MONOS PCT: 8 % (ref 3–12)
Neutro Abs: 5.9 10*3/uL (ref 1.7–7.7)
Neutrophils Relative %: 72 % (ref 43–77)
Platelets: 266 10*3/uL (ref 150–400)
RBC: 4.4 MIL/uL (ref 3.87–5.11)
RDW: 13.9 % (ref 11.5–15.5)
WBC: 8.1 10*3/uL (ref 4.0–10.5)

## 2013-11-29 MED ORDER — DIAZEPAM 5 MG PO TABS: 5.0000 mg | ORAL_TABLET | Freq: Four times a day (QID) | ORAL | Status: DC | PRN

## 2013-11-29 MED ORDER — OXYCODONE-ACETAMINOPHEN 5-325 MG PO TABS: 1.0000 | ORAL_TABLET | ORAL | Status: DC | PRN

## 2013-11-29 MED ORDER — SODIUM CHLORIDE 0.9 % IV SOLN
INTRAVENOUS | Status: DC
  Administered 2013-11-29: 13:00:00 via INTRAVENOUS

## 2013-11-29 MED ORDER — ONDANSETRON HCL 4 MG/2ML IJ SOLN
4.0000 mg | Freq: Once | INTRAMUSCULAR | Status: AC
  Administered 2013-11-29: 4 mg via INTRAVENOUS
  Filled 2013-11-29: qty 2

## 2013-11-29 MED ORDER — IOHEXOL 350 MG/ML SOLN
100.0000 mL | Freq: Once | INTRAVENOUS | Status: AC | PRN
  Administered 2013-11-29: 100 mL via INTRAVENOUS

## 2013-11-29 MED ORDER — HYDROMORPHONE HCL PF 1 MG/ML IJ SOLN
1.0000 mg | Freq: Once | INTRAMUSCULAR | Status: AC
  Administered 2013-11-29: 1 mg via INTRAVENOUS
  Filled 2013-11-29: qty 1

## 2013-11-29 NOTE — ED Notes (Addendum)
Pt c/o upper back pain and spasms x 2 days and intermittent R side numbness x 2 weeks.  Pain score 8/10.  Denies injury.  Pt recently had a cold w/ cough.  Pt reports taking prescribed pain medication w/o relief.  Hx of MS and followed by a neurologist.

## 2013-11-29 NOTE — ED Provider Notes (Signed)
CSN: [id number]     Arrival date & time 11/29/13  1112 History   First MD Initiated Contact with Patient 11/29/13 1158     Chief Complaint  Patient presents with  . Back Pain     (Consider location/radiation/quality/duration/timing/severity/associated sxs/prior Treatment) HPI Comments: Patient presents to the ER for evaluation of mid and upper back pain. Symptoms began today. She reports a constant, severe and sharp pain just under the bra strap area. She has significant worsening with minimal movement of the back. Patient has very slight shortness of breath, and states that the pain takes her breath away. She has had some cough associated with the symptoms as well. No fever.  Patient is a 38 y.o. female presenting with back pain.  Back Pain Associated symptoms: no chest pain and no fever     Past Medical History  Diagnosis Date  . Hernia 03-24-13    ventral hernia remains   . GERD (gastroesophageal reflux disease)   . Pancreatitis 03-24-13    2002, 2'2013, 03-14-13  . Pancreatic cyst 2002 onset  . Anxiety   . Depression     "recent breakup with partner of 15 yrs"  . Adrenal insufficiency 04/23/2013    ??  . MS (multiple sclerosis)    Past Surgical History  Procedure Laterality Date  . Abdominal surgery  ~ 2007    some sort of pancreatic cyst drainage.   . Cholecystectomy      ~ age 30-laparoscopic  . Adenoidectomy    . 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 Eus N/A 04/14/2013    Procedure: UPPER ENDOSCOPIC ULTRASOUND (EUS) LINEAR;  Surgeon: Rachael Fee, MD;  Location: [facility];  Service: Endoscopy;  Laterality: N/A;   Family History  Problem Relation Age of Onset  . Lupus Neg Hx   . Sarcoidosis Neg Hx   . Pancreatitis Neg Hx   . Ataxia Neg Hx   . Chorea Neg Hx   . Dementia Neg Hx   . Mental retardation Neg Hx   . Migraines Neg Hx   . Multiple sclerosis Neg Hx   . Neurofibromatosis Neg Hx   . Neuropathy Neg Hx    . Parkinsonism Neg Hx   . Seizures Neg Hx   . Stroke Neg Hx   . Colitis Maternal Aunt   . Diabetes Mother   . Diabetes Father   . Diabetes      many family members   History  Substance Use Topics  . Smoking status: Former Smoker -- 0.50 packs/day for 20 years    Types: Cigarettes    Quit date: 05/26/2013  . Smokeless tobacco: Never Used  . Alcohol Use: No   OB History   Grav Para Term Preterm Abortions TAB SAB Ect Mult Living                 Review of Systems  Constitutional: Negative for fever.  Respiratory: Positive for shortness of breath.   Cardiovascular: Negative for chest pain.  Musculoskeletal: Positive for back pain.  All other systems reviewed and are negative.     Allergies  Penicillins; Latex; and Morphine and related  Home Medications   Prior to Admission medications   Medication Sig Start Date End Date Taking? Authorizing Provider  cholecalciferol (VITAMIN D) 1000 UNITS tablet Take 5,000 Units by mouth daily.   Yes Historical Provider, MD  Glatiramer Acetate (COPAXONE) 40 MG/ML SOSY Inject 1  Syringe into the skin 3 (three) times a week. 09/12/13  Yes Adam Gus Rankinobert Jaffe, DO  HYDROcodone-acetaminophen (NORCO) 10-325 MG per tablet Take 1 tablet by mouth every 6 (six) hours as needed for severe pain.   Yes Historical Provider, MD  lipase/protease/amylase (CREON-12/PANCREASE) 12000 UNITS CPEP capsule Take 2 capsules by mouth 3 (three) times daily with meals. 07/31/13  Yes Catarina Hartshornavid Tat, MD  oxyCODONE-acetaminophen (PERCOCET/ROXICET) 5-325 MG per tablet Take 1 tablet by mouth every 4 (four) hours as needed for severe pain. 10/27/13  Yes Tatyana A Kirichenko, PA-C  pantoprazole (PROTONIX) 40 MG tablet Take 40 mg by mouth daily.   Yes Historical Provider, MD  promethazine (PHENERGAN) 25 MG tablet Take 1 tablet (25 mg total) by mouth every 6 (six) hours as needed for nausea or vomiting. 10/27/13  Yes Tatyana A Kirichenko, PA-C   BP 99/59  Pulse 70  Temp(Src) 98.1 F  (36.7 C) (Oral)  Resp 16  SpO2 95%  LMP 09/29/2013 Physical Exam  Constitutional: She is oriented to person, place, and time. She appears well-developed and well-nourished. No distress.  HENT:  Head: Normocephalic and atraumatic.  Right Ear: Hearing normal.  Left Ear: Hearing normal.  Nose: Nose normal.  Mouth/Throat: Oropharynx is clear and moist and mucous membranes are normal.  Eyes: Conjunctivae and EOM are normal. Pupils are equal, round, and reactive to light.  Neck: Normal range of motion. Neck supple.  Cardiovascular: Regular rhythm, S1 normal and S2 normal.  Exam reveals no gallop and no friction rub.   No murmur heard. Pulmonary/Chest: Effort normal and breath sounds normal. No respiratory distress. She exhibits no tenderness.  Abdominal: Soft. Normal appearance and bowel sounds are normal. There is no hepatosplenomegaly. There is no tenderness. There is no rebound, no guarding, no tenderness at McBurney's point and negative Murphy's sign. No hernia.  Musculoskeletal: Normal range of motion.       Cervical back: Normal.       Thoracic back: She exhibits tenderness and spasm.       Lumbar back: Normal.       Back:  Neurological: She is alert and oriented to person, place, and time. She has normal strength. No cranial nerve deficit or sensory deficit. Coordination normal. GCS eye subscore is 4. GCS verbal subscore is 5. GCS motor subscore is 6.  Skin: Skin is warm, dry and intact. No rash noted. No cyanosis.  Psychiatric: She has a normal mood and affect. Her speech is normal and behavior is normal. Thought content normal.    ED Course  Procedures (including critical care time) Labs Review Labs Reviewed  COMPREHENSIVE METABOLIC PANEL - Abnormal; Notable for the following:    Total Bilirubin <0.2 (*)    All other components within normal limits  CBC WITH DIFFERENTIAL  URINALYSIS, ROUTINE W REFLEX MICROSCOPIC    Imaging Review Ct Angio Chest Pe W/cm &/or Wo  Cm  11/29/2013   CLINICAL DATA:  Upper back and right lateral chest pain. Shortness of breath. Evaluate for pulmonary embolism.  EXAM: CT ANGIOGRAPHY CHEST WITH CONTRAST  TECHNIQUE: Multidetector CT imaging of the chest was performed using the standard protocol during bolus administration of intravenous contrast. Multiplanar CT image reconstructions and MIPs were obtained to evaluate the vascular anatomy.  CONTRAST:  100mL OMNIPAQUE IOHEXOL 350 MG/ML SOLN  COMPARISON:  No priors.  FINDINGS: Mediastinum: Study is limited by considerable patient respiratory motion. With these limitations in mind there is no central, lobar or proximal segmental sized pulmonary embolism. Distal  segmental and subsegmental sized emboli cannot be entirely excluded secondary to the large amount of respiratory motion. Heart size is mildly enlarged. There is no significant pericardial fluid, thickening or pericardial calcification. No pathologically enlarged mediastinal or hilar lymph nodes. The esophagus is unremarkable in appearance.  Lungs/Pleura: Mosaic appearance of the lung parenchyma suggests regions of air trapping related to small airways disease. No acute consolidative airspace disease. No pleural effusions. Assessment for small pulmonary nodules is limited by considerable patient respiratory motion, but no large suspicious appearing pulmonary nodules or masses are noted. No pneumothorax.  Upper Abdomen: Unremarkable.  Musculoskeletal: There are no aggressive appearing lytic or blastic lesions noted in the visualized portions of the skeleton.  Review of the MIP images confirms the above findings.  IMPRESSION: 1. Limited examination demonstrating no central, lobar or proximal segmental sized embolus. Distal segmental and subsegmental sized emboli cannot be excluded. 2. Mild cardiomegaly. 3. The appearance of the lung parenchyma suggests extensive air trapping, indicative of small airways disease.   Electronically Signed   By: Trudie Reed M.D.   On: 11/29/2013 14:33     EKG Interpretation   Date/Time:  Tuesday November 29 2013 12:55:00 EDT Ventricular Rate:  78 PR Interval:  144 QRS Duration: 90 QT Interval:  370 QTC Calculation: 421 R Axis:   39 Text Interpretation:  Sinus rhythm Low voltage, precordial leads No  significant change since last tracing Confirmed by POLLINA  MD,  CHRISTOPHER [phone]) on 11/29/2013 1:20:38 PM      MDM   Final diagnoses:  Thoracic back pain   Patient presents to the ER for evaluation of mid to upper back pain, acute in onset. The patient has no evidence of hypoxia. There is no hypotension and her heart rate is normal. She is afebrile. There does not appear to be any respiratory distress.  The EKG does not show any acute changes. Negative for acute coronary syndrome. Patient does have a history of pancreatitis, but this pain is clearly different. There is no anterior abdominal or chest pain or tenderness.  Examination reveals reproducible pain in the midthoracic region. There is accompanying paraspinal muscle spasm and severe tenderness with worsening pain with range of motion.  CT scan of the chest was performed to rule out PE, aortic abnormality, pneumonia, et Karie Soda. No obvious PE was seen, although there was some limitation due to respiratory artifact. I have low suspicion and the patient. No other acute abnormalities are noted, although there is evidence of chronic small airways disease.  Patient analgesia here in the ER. 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 He was treated with Dilaudid here in the ER. Will increase hydrocodone to oxycodone, also Valium for spasm. Followup with primary doctor.   Gilda Crease, MD 11/29/13 [phone]

## 2013-11-29 NOTE — Discharge Instructions (Signed)
Back Pain, Adult Low back pain is very common. About 1 in 5 people have back pain.The cause of low back pain is rarely dangerous. The pain often gets better over time.About half of people with a sudden onset of back pain feel better in just 2 weeks. About 8 in 10 people feel better by 6 weeks.  CAUSES Some common causes of back pain include:  Strain of the muscles or ligaments supporting the spine.  Wear and tear (degeneration) of the spinal discs.  Arthritis.  Direct injury to the back. DIAGNOSIS Most of the time, the direct cause of low back pain is not known.However, back pain can be treated effectively even when the exact cause of the pain is unknown.Answering your caregiver's questions about your overall health and symptoms is one of the most accurate ways to make sure the cause of your pain is not dangerous. If your caregiver needs more information, he or she may order lab work or imaging tests (X-rays or MRIs).However, even if imaging tests show changes in your back, this usually does not require surgery. HOME CARE INSTRUCTIONS For many people, back pain returns.Since low back pain is rarely dangerous, it is often a condition that people can learn to manageon their own.   Remain active. It is stressful on the back to sit or stand in one place. Do not sit, drive, or stand in one place for more than 30 minutes at a time. Take short walks on level surfaces as soon as pain allows.Try to increase the length of time you walk each day.  Do not stay in bed.Resting more than 1 or 2 days can delay your recovery.  Do not avoid exercise or work.Your body is made to move.It is not dangerous to be active, even though your back may hurt.Your back will likely heal faster if you return to being active before your pain is gone.  Pay attention to your body when you bend and lift. Many people have less discomfortwhen lifting if they bend their knees, keep the load close to their bodies,and  avoid twisting. Often, the most comfortable positions are those that put less stress on your recovering back.  Find a comfortable position to sleep. Use a firm mattress and lie on your side with your knees slightly bent. If you lie on your back, put a pillow under your knees.  Only take over-the-counter or prescription medicines as directed by your caregiver. Over-the-counter medicines to reduce pain and inflammation are often the most helpful.Your caregiver may prescribe muscle relaxant drugs.These medicines help dull your pain so you can more quickly return to your normal activities and healthy exercise.  Put ice on the injured area.  Put ice in a plastic bag.  Place a towel between your skin and the bag.  Leave the ice on for 15-20 minutes, 03-04 times a day for the first 2 to 3 days. After that, ice and heat may be alternated to reduce pain and spasms.  Ask your caregiver about trying back exercises and gentle massage. This may be of some benefit.  Avoid feeling anxious or stressed.Stress increases muscle tension and can worsen back pain.It is important to recognize when you are anxious or stressed and learn ways to manage it.Exercise is a great option. SEEK MEDICAL CARE IF:  You have pain that is not relieved with rest or medicine.  You have pain that does not improve in 1 week.  You have new symptoms.  You are generally not feeling well. SEEK   IMMEDIATE MEDICAL CARE IF:   You have pain that radiates from your back into your legs.  You develop new bowel or bladder control problems.  You have unusual weakness or numbness in your arms or legs.  You develop nausea or vomiting.  You develop abdominal pain.  You feel faint. Document Released: 08/04/2005 Document Revised: 02/03/2012 Document Reviewed: 12/23/2010 [employer] Patient Information 2014 [employer].  

## 2013-11-29 NOTE — ED Notes (Signed)
Pt reports acute onset of medial upper back pain that began on Sunday, progressively worse as well as rt arm/rt leg pain that is worse w/ movemet that also began on Sunday - pt also admits to x1 episode of n/v yesterday evening - denies diarrhea, fever, urinary symptoms or other complaints at this time. Pt is A&Ox4, no acute distress.

## 2013-11-29 NOTE — Progress Notes (Signed)
[facility] spoke with patient about community resources. Patient stated that she was not interested in paperwork. Stated she had resources at home but was waiting on disability.

## 2013-11-29 NOTE — ED Notes (Signed)
Patient transported to CT 

## 2013-11-29 NOTE — ED Notes (Signed)
Pt ambulating w/ assistance w/ steady gait on d/c in no acute distress, A&Ox4. D/c instructions reviewed w/ pt and family - pt and family deny any further questions or concerns at present. Rx given x2

## 2013-12-13 ENCOUNTER — Ambulatory Visit: Payer: Self-pay | Admitting: Neurology

## 2014-02-11 ENCOUNTER — Encounter (HOSPITAL_COMMUNITY): Payer: Self-pay | Admitting: Emergency Medicine

## 2014-02-11 ENCOUNTER — Inpatient Hospital Stay (HOSPITAL_COMMUNITY)
Admission: EM | Admit: 2014-02-11 | Discharge: 2014-02-14 | DRG: 059 | Disposition: A | Discharge status: Rehabilitation Facility | Payer: Medicaid Other | Attending: Internal Medicine | Admitting: Internal Medicine

## 2014-02-11 ENCOUNTER — Emergency Department (HOSPITAL_COMMUNITY): Payer: Medicaid Other

## 2014-02-11 DIAGNOSIS — Z6841 Body Mass Index (BMI) 40.0 and over, adult: Secondary | ICD-10-CM | POA: Diagnosis not present

## 2014-02-11 DIAGNOSIS — Z5189 Encounter for other specified aftercare: Secondary | ICD-10-CM | POA: Diagnosis not present

## 2014-02-11 DIAGNOSIS — Z87891 Personal history of nicotine dependence: Secondary | ICD-10-CM | POA: Diagnosis not present

## 2014-02-11 DIAGNOSIS — F411 Generalized anxiety disorder: Secondary | ICD-10-CM | POA: Diagnosis present

## 2014-02-11 DIAGNOSIS — M6281 Muscle weakness (generalized): Secondary | ICD-10-CM

## 2014-02-11 DIAGNOSIS — R5381 Other malaise: Secondary | ICD-10-CM | POA: Diagnosis present

## 2014-02-11 DIAGNOSIS — K861 Other chronic pancreatitis: Secondary | ICD-10-CM | POA: Diagnosis present

## 2014-02-11 DIAGNOSIS — B370 Candidal stomatitis: Secondary | ICD-10-CM | POA: Diagnosis present

## 2014-02-11 DIAGNOSIS — K219 Gastro-esophageal reflux disease without esophagitis: Secondary | ICD-10-CM | POA: Diagnosis present

## 2014-02-11 DIAGNOSIS — Z880 Allergy status to penicillin: Secondary | ICD-10-CM | POA: Diagnosis not present

## 2014-02-11 DIAGNOSIS — Z79899 Other long term (current) drug therapy: Secondary | ICD-10-CM

## 2014-02-11 DIAGNOSIS — G35 Multiple sclerosis: Secondary | ICD-10-CM | POA: Diagnosis present

## 2014-02-11 DIAGNOSIS — G35D Multiple sclerosis, unspecified: Secondary | ICD-10-CM | POA: Diagnosis not present

## 2014-02-11 DIAGNOSIS — R531 Weakness: Secondary | ICD-10-CM

## 2014-02-11 DIAGNOSIS — Z91040 Latex allergy status: Secondary | ICD-10-CM

## 2014-02-11 DIAGNOSIS — R5383 Other fatigue: Secondary | ICD-10-CM

## 2014-02-11 LAB — URINALYSIS, ROUTINE W REFLEX MICROSCOPIC
Bilirubin Urine: NEGATIVE
Glucose, UA: NEGATIVE mg/dL
Hgb urine dipstick: NEGATIVE
Ketones, ur: NEGATIVE mg/dL
Leukocytes, UA: NEGATIVE
Nitrite: NEGATIVE
Protein, ur: NEGATIVE mg/dL
Specific Gravity, Urine: 1.023 (ref 1.005–1.030)
Urobilinogen, UA: 0.2 mg/dL (ref 0.0–1.0)
pH: 6 (ref 5.0–8.0)

## 2014-02-11 LAB — CBC WITH DIFFERENTIAL/PLATELET
Basophils Absolute: 0 10*3/uL (ref 0.0–0.1)
Basophils Relative: 0 % (ref 0–1)
Eosinophils Absolute: 0.1 10*3/uL (ref 0.0–0.7)
Eosinophils Relative: 1 % (ref 0–5)
HCT: 37.2 % (ref 36.0–46.0)
Hemoglobin: 12 g/dL (ref 12.0–15.0)
Lymphocytes Relative: 18 % (ref 12–46)
Lymphs Abs: 1.7 10*3/uL (ref 0.7–4.0)
MCH: 28 pg (ref 26.0–34.0)
MCHC: 32.3 g/dL (ref 30.0–36.0)
MCV: 86.7 fL (ref 78.0–100.0)
Monocytes Absolute: 0.6 10*3/uL (ref 0.1–1.0)
Monocytes Relative: 7 % (ref 3–12)
Neutro Abs: 6.9 10*3/uL (ref 1.7–7.7)
Neutrophils Relative %: 74 % (ref 43–77)
Platelets: 270 10*3/uL (ref 150–400)
RBC: 4.29 MIL/uL (ref 3.87–5.11)
RDW: 14.4 % (ref 11.5–15.5)
WBC: 9.3 10*3/uL (ref 4.0–10.5)

## 2014-02-11 LAB — BASIC METABOLIC PANEL
BUN: 8 mg/dL (ref 6–23)
CO2: 22 mEq/L (ref 19–32)
Calcium: 9.1 mg/dL (ref 8.4–10.5)
Chloride: 103 mEq/L (ref 96–112)
Creatinine, Ser: 0.48 mg/dL — ABNORMAL LOW (ref 0.50–1.10)
GFR calc Af Amer: >90 mL/min (ref >90)
GFR calc non Af Amer: >90 mL/min (ref >90)
Glucose, Bld: 98 mg/dL (ref 70–99)
Potassium: 3.9 mEq/L (ref 3.7–5.3)
Sodium: 141 mEq/L (ref 137–147)

## 2014-02-11 LAB — RAPID URINE DRUG SCREEN, HOSP PERFORMED
Amphetamines: NOT DETECTED
Barbiturates: NOT DETECTED
Benzodiazepines: NOT DETECTED
Cocaine: NOT DETECTED
Opiates: NOT DETECTED
Tetrahydrocannabinol: NOT DETECTED

## 2014-02-11 LAB — LACTIC ACID, PLASMA: Lactic Acid, Venous: 1.6 mmol/L (ref 0.5–2.2)

## 2014-02-11 LAB — SALICYLATE LEVEL: Salicylate Lvl: <2 mg/dL — ABNORMAL LOW (ref 2.8–20.0)

## 2014-02-11 MED ORDER — SODIUM CHLORIDE 0.9 % IJ SOLN
3.0000 mL | Freq: Two times a day (BID) | INTRAMUSCULAR | Status: DC
  Administered 2014-02-11 – 2014-02-14 (×5): 3 mL via INTRAVENOUS

## 2014-02-11 MED ORDER — ONDANSETRON HCL 4 MG/2ML IJ SOLN
4.0000 mg | Freq: Four times a day (QID) | INTRAMUSCULAR | Status: DC | PRN
  Administered 2014-02-12 (×3): 4 mg via INTRAVENOUS
  Filled 2014-02-11 (×3): qty 2

## 2014-02-11 MED ORDER — GLATIRAMER ACETATE 40 MG/ML ~~LOC~~ SOSY: 1.0000 | PREFILLED_SYRINGE | SUBCUTANEOUS | Status: DC

## 2014-02-11 MED ORDER — HYDROMORPHONE HCL PF 1 MG/ML IJ SOLN
1.0000 mg | Freq: Once | INTRAMUSCULAR | Status: AC
  Administered 2014-02-11: 1 mg via INTRAVENOUS
  Filled 2014-02-11: qty 1

## 2014-02-11 MED ORDER — PANCRELIPASE (LIP-PROT-AMYL) 12000-38000 UNITS PO CPEP: 2.0000 | ORAL_CAPSULE | Freq: Three times a day (TID) | ORAL | Status: DC

## 2014-02-11 MED ORDER — B COMPLEX PO TABS: 1.0000 | ORAL_TABLET | Freq: Every day | ORAL | Status: DC

## 2014-02-11 MED ORDER — PANTOPRAZOLE SODIUM 40 MG PO TBEC: 40.0000 mg | DELAYED_RELEASE_TABLET | Freq: Every day | ORAL | Status: DC

## 2014-02-11 MED ORDER — SODIUM CHLORIDE 0.9 % IJ SOLN: 3.0000 mL | INTRAMUSCULAR | Status: DC | PRN

## 2014-02-11 MED ORDER — SODIUM CHLORIDE 0.9 % IV SOLN
INTRAVENOUS | Status: AC
Start: 2014-02-11 — End: 2014-02-12
  Administered 2014-02-11: 23:00:00 via INTRAVENOUS

## 2014-02-11 MED ORDER — ONDANSETRON HCL 4 MG/2ML IJ SOLN
4.0000 mg | Freq: Once | INTRAMUSCULAR | Status: AC
  Administered 2014-02-11: 4 mg via INTRAVENOUS
  Filled 2014-02-11: qty 2

## 2014-02-11 MED ORDER — DOCUSATE SODIUM 100 MG PO CAPS: 100.0000 mg | ORAL_CAPSULE | Freq: Two times a day (BID) | ORAL | Status: DC

## 2014-02-11 MED ORDER — OXYCODONE HCL 5 MG PO TABS: 5.0000 mg | ORAL_TABLET | ORAL | Status: DC | PRN

## 2014-02-11 MED ORDER — VITAMIN D 1000 UNITS PO TABS: 5000.0000 [IU] | ORAL_TABLET | Freq: Every day | ORAL | Status: DC

## 2014-02-11 MED ORDER — ENOXAPARIN SODIUM 40 MG/0.4ML ~~LOC~~ SOLN
40.0000 mg | SUBCUTANEOUS | Status: DC
  Filled 2014-02-11 (×3): qty 0.4

## 2014-02-11 MED ORDER — B COMPLEX-C PO TABS: 1.0000 | ORAL_TABLET | Freq: Every day | ORAL | Status: DC

## 2014-02-11 MED ORDER — SODIUM CHLORIDE 0.9 % IV SOLN: 250.0000 mL | INTRAVENOUS | Status: DC | PRN

## 2014-02-11 MED ORDER — SODIUM CHLORIDE 0.9 % IV SOLN
1000.0000 mg | INTRAVENOUS | Status: DC
  Administered 2014-02-11 – 2014-02-13 (×3): 1000 mg via INTRAVENOUS
  Filled 2014-02-11 (×4): qty 8

## 2014-02-11 NOTE — ED Notes (Addendum)
Pt presents to ED via EMS for complaints of tingling of numbness in right leg since last night, blurred vison in rt eye today, difficulty speaking today. Now has pain in rt leg.  Pt is newly diagnosised with MS. EMS placed 22g in left AC. cbg 108. 150/86.

## 2014-02-11 NOTE — H&P (Signed)
Date: 02/11/2014               Patient Name:  Mary [name] MRN: [id number]  DOB: 05/21/1976 Age / Sex: 38 y.o., female   PCP: No Pcp Per Patient         Medical Service: Internal Medicine Teaching Service         Attending Physician: Dr. Farley Ly, MD    First Contact: Dr. Mikey Bussing Pager: [phone]  Second Contact: Dr. Garald Braver  Pager: [phone]       After Hours (After 5p/  First Contact Pager: [phone]  weekends / holidays): Second Contact Pager: [phone]   Chief Complaint: right sided numbness/tingling/pain/weakness  History of Present Illness:  Mary [name] is a 38 year old pleasant woman with past medical history of newly diagnosed relapsing-remitting MS, chronic pancreatitis, anxiety, depression, and GERD who presents with right sided numbness/tingling, pain, and weakness of 1 day duration.   Pt reports that around 7 pm last night she began having numbness and tingling in her right leg along with pain and weakness. She then began having problems with coordination and difficulty walking as well. This morning when she woke at 7 AM her symptoms were worse. At noon her right arm was also numb/tingling with weakness and pain. At 2 PM she called her neurologist's office (Dr. Jearl Klinefelter) who told her to call 911 after which she was brought to the ED. She also reports having blurry vision, stuttering, numbness of the right side of her face. She reports being under a lot of stress lately. Her symptoms worsen when overheated but none recently.  She was diagnosed with relapsing-remititng MS January 26 of this year with lumber tap and MRI after work-up for chronic pancreatitis revealed demyelinating disease on CT Head. She has chronic fatigue, occasional paraesthesias on her right side, muscles spasms in her back, occasional ataxia  ("druken-sober"), mild cognitive impairment (short-term memory loss and word finding), band-like sensation around her chest/torso, and electrical sensation down  her legs. She reports these chronic symptoms have not been debilitating. She denies dysphagia, urinary/bowel incontinence, or constipation.  She has been on copaxone SQ x3 weekly since the beginning of February and has not missed any injections. She has never had a MS flare before and has never been on IV steroids. She denies FH of MS.   Meds: Current Facility-Administered Medications  Medication Dose Route Frequency Provider Last Rate Last Dose  . methylPREDNISolone sodium succinate (SOLU-MEDROL) 1,000 mg in sodium chloride 0.9 % 50 mL IVPB  1,000 mg Intravenous Q24H Ritta Slot, MD      . ondansetron [facility]) injection 4 mg  4 mg Intravenous Q6H PRN Genelle Gather, MD      . pantoprazole (PROTONIX) EC tablet 40 mg  40 mg Oral Q0600 Ritta Slot, MD       Current Outpatient Prescriptions  Medication Sig Dispense Refill  . b complex vitamins tablet Take 1 tablet by mouth daily.      . cholecalciferol (VITAMIN D) 1000 UNITS tablet Take 5,000 Units by mouth daily.      Marland Kitchen Glatiramer Acetate (COPAXONE) 40 MG/ML SOSY Inject 1 Syringe into the skin 3 (three) times a week.  12 Syringe  0  . lipase/protease/amylase (CREON-12/PANCREASE) 12000 UNITS CPEP capsule Take 2 capsules by mouth 3 (three) times daily with meals.  180 capsule  2  . oxyCODONE (OXY IR/ROXICODONE) 5 MG immediate release tablet Take 5 mg by mouth every 4 (four) hours as needed for severe  pain.      . pantoprazole (PROTONIX) 40 MG tablet Take 40 mg by mouth daily.      . promethazine (PHENERGAN) 25 MG tablet Take 1 tablet (25 mg total) by mouth every 6 (six) hours as needed for nausea or vomiting.  15 tablet  0    Allergies: Allergies as of 02/11/2014 - Review Complete 02/11/2014  Allergen Reaction Noted  . Amoxicillin Anaphylaxis 02/11/2014  . Penicillins Anaphylaxis 03/15/2013  . Latex Itching 03/15/2013  . Morphine and related Nausea And Vomiting 03/15/2013   Past Medical History  Diagnosis Date  . Hernia  03-24-13    ventral hernia remains   . GERD (gastroesophageal reflux disease)   . Pancreatitis 03-24-13    2002, 2'2013, 03-14-13  . Pancreatic cyst 2002 onset  . Anxiety   . Depression     "recent breakup with partner of 15 yrs"  . Adrenal insufficiency 04/23/2013    ??  . MS (multiple sclerosis)    Past Surgical History  Procedure Laterality Date  . Abdominal surgery  ~ 2007    some sort of pancreatic cyst drainage.   . Cholecystectomy      ~ age 13-laparoscopic  . Adenoidectomy    . 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 Eus N/A 04/14/2013    Procedure: UPPER ENDOSCOPIC ULTRASOUND (EUS) LINEAR;  Surgeon: Rachael Fee, MD;  Location: [facility];  Service: Endoscopy;  Laterality: N/A;   Family History  Problem Relation Age of Onset  . Lupus Neg Hx   . Sarcoidosis Neg Hx   . Pancreatitis Neg Hx   . Ataxia Neg Hx   . Chorea Neg Hx   . Dementia Neg Hx   . Mental retardation Neg Hx   . Migraines Neg Hx   . Multiple sclerosis Neg Hx   . Neurofibromatosis Neg Hx   . Neuropathy Neg Hx   . Parkinsonism Neg Hx   . Seizures Neg Hx   . Stroke Neg Hx   . Colitis Maternal Aunt   . Diabetes Mother   . Diabetes Father   . Diabetes      many family members   History   Social History  . Marital Status: Single    Spouse Name: N/A    Number of Children: N/A  . Years of Education: N/A   Occupational History  . Not on file.   Social History Main Topics  . Smoking status: Former Smoker -- 0.50 packs/day for 20 years    Types: Cigarettes    Quit date: 05/26/2013  . Smokeless tobacco: Never Used  . Alcohol Use: No  . Drug Use: No  . Sexual Activity: Not Currently   Other Topics Concern  . Not on file   Social History Narrative   Lives permanently in [place] with mom and stepfather.  Has not started working yet and was unemployed in [place].             Review of Systems: Review of Systems  Constitutional: Positive  for malaise/fatigue (chronic). Negative for fever, chills, weight loss and diaphoresis.  HENT: Negative for congestion and sore throat.   Eyes: Positive for blurred vision.  Respiratory: Negative for cough and shortness of breath.   Cardiovascular: Negative for chest pain, palpitations and leg swelling.  Gastrointestinal: Positive for heartburn (controlled on PPI), nausea (chronic) and abdominal pain (chronic). Negative for vomiting, diarrhea, constipation and blood in stool.  Genitourinary: Negative for dysuria, urgency, frequency and hematuria.  Musculoskeletal: Positive for back pain (chronic with spasms). Negative for falls and neck pain.       B/l arm swelling  Neurological: Positive for tingling (right side (face, arm, leg)), sensory change (right sided (face, arm, leg)), speech change (stuttering which is new), focal weakness (right arm and leg) and weakness. Negative for dizziness and headaches.  Psychiatric/Behavioral: Positive for memory loss (short-term).   Physical Exam: Blood pressure 123/82, pulse 90, temperature 98.4 F (36.9 C), temperature source Oral, resp. rate 14, height 5\' 4"  (1.626 m), weight 245 lb (111.131 kg), last menstrual period 01/27/2014, SpO2 100.00%. Physical Exam  Constitutional: She is oriented to person, place, and time. She appears well-developed and well-nourished. No distress.  HENT:  Head: Normocephalic and atraumatic.  Right Ear: External ear normal.  Left Ear: External ear normal.  Nose: Nose normal.  Mouth/Throat: Oropharynx is clear and moist. No oropharyngeal exudate.  Eyes: Conjunctivae and EOM are normal. Pupils are equal, round, and reactive to light. Right eye exhibits no discharge. Left eye exhibits no discharge. No scleral icterus.  Neck: Normal range of motion. Neck supple.  Cardiovascular: Normal rate, regular rhythm and normal heart sounds.   Pulmonary/Chest: Effort normal and breath sounds normal. No respiratory distress. She has no  wheezes. She has no rales.  Abdominal: Soft. Bowel sounds are normal. She exhibits no distension. There is no tenderness. There is no rebound and no guarding.  Musculoskeletal: Normal range of motion. She exhibits no edema and no tenderness.  Neurological: She is alert and oriented to person, place, and time. A cranial nerve deficit (decreased sensation of right side of face ) is present.  Stuttering Decreased sensation light touch of right face/UE/LE Muscle strength 5/5 on left side, 3/5 on right side secondary due to poor effort from pain  Left FTN intact, unable to perform on right   Pronator drift negative  Babinski negative   Skin: She is not diaphoretic.  Tattoos on her arms  Psychiatric: She has a normal mood and affect. Her behavior is normal. Judgment and thought content normal.     Lab results: Basic Metabolic Panel:  Recent Labs  16/10/96 1830  NA 141  K 3.9  CL 103  CO2 22  GLUCOSE 98  BUN 8  CREATININE 0.48*  CALCIUM 9.1   CBC:  Recent Labs  02/11/14 1830  WBC 9.3  NEUTROABS 6.9  HGB 12.0  HCT 37.2  MCV 86.7  PLT 270   Urine Drug Screen: Drugs of Abuse     Component Value Date/Time   LABOPIA NONE DETECTED 02/11/2014 1801   COCAINSCRNUR NONE DETECTED 02/11/2014 1801   LABBENZ NONE DETECTED 02/11/2014 1801   AMPHETMU NONE DETECTED 02/11/2014 1801   THCU NONE DETECTED 02/11/2014 1801   LABBARB NONE DETECTED 02/11/2014 1801    Urinalysis:  Recent Labs  02/11/14 1801  COLORURINE YELLOW  LABSPEC 1.023  PHURINE 6.0  GLUCOSEU NEGATIVE  HGBUR NEGATIVE  BILIRUBINUR NEGATIVE  KETONESUR NEGATIVE  PROTEINUR NEGATIVE  UROBILINOGEN 0.2  NITRITE NEGATIVE  LEUKOCYTESUR NEGATIVE    Imaging results:  Ct Head Wo Contrast  02/11/2014   CLINICAL DATA:  Right-sided weakness.  Multiple sclerosis.  EXAM: CT HEAD WITHOUT CONTRAST  TECHNIQUE: Contiguous axial images were obtained from the base of the skull through the vertex without intravenous contrast.   COMPARISON:  Head MRI 09/05/2013 and CT 04/22/2013  FINDINGS: Ventricles and sulci are within normal limits for age. There is  no evidence of acute cortical infarct, intracranial hemorrhage, mass, midline shift, or extra-axial fluid collection. Extensive, patchy hypodensities throughout the cerebral white matter bilaterally are similar to the prior CT. Orbits are unremarkable. Mastoid air cells and visualized paranasal sinuses are clear.  IMPRESSION: 1. No evidence of acute intracranial abnormality. 2. Unchanged cerebral white matter disease.   Electronically Signed   By: Sebastian AcheAllen  Grady   On: 02/11/2014 18:43    Other results: EKG:  Ventricular Rate: 80  PR Interval: 136  QRS Duration: 94  QT Interval: 380  QTC Calculation: 438  R Axis: 42  Text Interpretation: Sinus rhythm Low voltage, precordial leads No  significant change    Assessment & Plan by Problem:   Acute flare of relapsing-remitting Multiple Sclerosis - Pt newly diagnosed with MS January of this yr compliant with copaxone x3 weekly who presents with right sided paraesthesias and weakness, ataxia, and visual/speech impairments with debilitation most likely due to acute flare concerning for disease progression. CVA less likely. CT head with unchanged cerebral white matter disease. UDS negative.  -Appreciate neurology recommendations  -Obtain MRI brain w/w cont -Obtain MR cervical spine w/wo cont -Bedside swallow evaluation  -Start solumedrol 1 g daily for 3-5 days and then oral taper based on response  -Continue SQ copaxone MWF  -Oxycodone 5 mg Q 4hr PRN  -PT consult  -Frequent neuro checks  Chronic Pancreatitis - Pt with chronic nausea and abdominal pain with no acute flare. Pt compliant with pancreatic enzyme replacement. Etiology unknown, possibly idiopathic.  -Continue home creon 2 capsules TID with meals  -IV zofran 4 mg Q 6 hr PRN nausea  GERD - Currently without acid reflux symptoms. Pt had home on protonix daily  -PO  protonix 40 mg daily   Diet: Regular once passes swallow evaluation DVT Ppx: Lovenox  Code: Full  Dispo: Disposition is deferred at this time, awaiting improvement of current medical problems. Anticipated discharge in approximately 3-5 day(s).   The patient does not have a current PCP (No Pcp Per Patient) and [facility].  The patient does not have transportation limitations that hinder transportation to clinic appointments.  Signed: Otis BraceMarjan Rabbani, MD 02/11/2014, 9:39 PM

## 2014-02-11 NOTE — ED Notes (Signed)
Neurology at bedside.

## 2014-02-11 NOTE — ED Notes (Signed)
Report given to [place], RN.

## 2014-02-11 NOTE — ED Notes (Signed)
Patient transported to CT 

## 2014-02-11 NOTE — ED Notes (Signed)
Patient assisted to restroom in wheelchair with 2 people assist. Urine collected at bedside.

## 2014-02-11 NOTE — Progress Notes (Signed)
Patient arrived from E.D accompanied by family . Patient alert and oriented X4 oriented  To room  Will continue to monitor.

## 2014-02-11 NOTE — ED Provider Notes (Signed)
CSN: [id number]     Arrival date & time 02/11/14  1531 History   First MD Initiated Contact with Patient 02/11/14 1705     Chief Complaint  Patient presents with  . Weakness     (Consider location/radiation/quality/duration/timing/severity/associated sxs/prior Treatment) HPI  38yF with R sided weakness and pain. Past history of recurrent pancreatitis. Had CT as work up of this to evaluate for possible intracranial process this past September. It showed extensive hypodensity in the bilateral primarily subcortical white matter. Follow-up MR showed demyelinating disease. On copaxone for possible multiple sclerosis. Followed by Dr Everlena Cooper. Her symptoms up to this point have been fairly mild. Transient weakness/clumsiness in R hand, R leg numbness and sometimes mixing up words but says she sometimes does this when stressed.   She hasn't had previous symptoms to degree of her current. Pain in RUE from shoulder to hand and weakness. Describes as sharp and tingling. Similar pain and also weakness in R leg. Numbness in R face. Mild blurred vision R eye. Stuttering speech. Symptoms have been progressing since last night. Has been under increased stress recently. Reports compliance with medications.  Past Medical History  Diagnosis Date  . Hernia 03-24-13    ventral hernia remains   . GERD (gastroesophageal reflux disease)   . Pancreatitis 03-24-13    2002, 2'2013, 03-14-13  . Pancreatic cyst 2002 onset  . Anxiety   . Depression     "recent breakup with partner of 15 yrs"  . Adrenal insufficiency 04/23/2013    ??  . MS (multiple sclerosis)    Past Surgical History  Procedure Laterality Date  . Abdominal surgery  ~ 2007    some sort of pancreatic cyst drainage.   . Cholecystectomy      ~ age 3-laparoscopic  . Adenoidectomy    . 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 Eus N/A 04/14/2013    Procedure: UPPER ENDOSCOPIC ULTRASOUND (EUS) LINEAR;   Surgeon: Rachael Fee, MD;  Location: [facility];  Service: Endoscopy;  Laterality: N/A;   Family History  Problem Relation Age of Onset  . Lupus Neg Hx   . Sarcoidosis Neg Hx   . Pancreatitis Neg Hx   . Ataxia Neg Hx   . Chorea Neg Hx   . Dementia Neg Hx   . Mental retardation Neg Hx   . Migraines Neg Hx   . Multiple sclerosis Neg Hx   . Neurofibromatosis Neg Hx   . Neuropathy Neg Hx   . Parkinsonism Neg Hx   . Seizures Neg Hx   . Stroke Neg Hx   . Colitis Maternal Aunt   . Diabetes Mother   . Diabetes Father   . Diabetes      many family members   History  Substance Use Topics  . Smoking status: Former Smoker -- 0.50 packs/day for 20 years    Types: Cigarettes    Quit date: 05/26/2013  . Smokeless tobacco: Never Used  . Alcohol Use: No   OB History   Grav Para Term Preterm Abortions TAB SAB Ect Mult Living                 Review of Systems  All systems reviewed and negative, other than as noted in HPI.   Allergies  Amoxicillin; Penicillins; Latex; and Morphine and related  Home Medications   Prior to Admission medications   Medication Sig Start Date End Date Taking?  Authorizing Ami Thornsberry  b complex vitamins tablet Take 1 tablet by mouth daily.   Yes Historical Renae Mottley, MD  cholecalciferol (VITAMIN D) 1000 UNITS tablet Take 5,000 Units by mouth daily.   Yes Historical Lorine Iannaccone, MD  Glatiramer Acetate (COPAXONE) 40 MG/ML SOSY Inject 1 Syringe into the skin 3 (three) times a week. 09/12/13  Yes Adam Gus Rankinobert Jaffe, DO  lipase/protease/amylase (CREON-12/PANCREASE) 12000 UNITS CPEP capsule Take 2 capsules by mouth 3 (three) times daily with meals. 07/31/13  Yes Catarina Hartshornavid Tat, MD  oxyCODONE (OXY IR/ROXICODONE) 5 MG immediate release tablet Take 5 mg by mouth every 4 (four) hours as needed for severe pain.   Yes Historical Shin Lamour, MD  pantoprazole (PROTONIX) 40 MG tablet Take 40 mg by mouth daily.   Yes Historical Stacyann Mcconaughy, MD  promethazine (PHENERGAN) 25 MG tablet  Take 1 tablet (25 mg total) by mouth every 6 (six) hours as needed for nausea or vomiting. 10/27/13  Yes Tatyana A Kirichenko, PA-C   BP 123/82  Pulse 90  Temp(Src) 98.4 F (36.9 C) (Oral)  Resp 14  Ht 5\' 4"  (1.626 m)  Wt 245 lb (111.131 kg)  BMI 42.03 kg/m2  SpO2 100%  LMP 01/27/2014 Physical Exam  Nursing note and vitals reviewed. Constitutional: She appears well-developed and well-nourished. No distress.  Laying in bed. NAD. Obese.   HENT:  Head: Normocephalic and atraumatic.  Eyes: Conjunctivae are normal. Right eye exhibits no discharge. Left eye exhibits no discharge.  Neck: Neck supple.  Cardiovascular: Normal rate, regular rhythm and normal heart sounds.  Exam reveals no gallop and no friction rub.   No murmur heard. Pulmonary/Chest: Effort normal and breath sounds normal. No respiratory distress.  Abdominal: Soft. She exhibits no distension. There is no tenderness.  Musculoskeletal: She exhibits no edema and no tenderness.  Neurological: She is alert.  Speech hesitant at times. Understandable. Content appropriate. Follows commands. Decreased grip strength R hand. Strength 3/5 RUE flex/ext. Strength 3/5 RLE. Decreased sensation to light touch RUE/RLE and R face. CN otherwise intact.   Skin: Skin is warm and dry.  Psychiatric: She has a normal mood and affect. Her behavior is normal. Thought content normal.    ED Course  Procedures (including critical care time) Labs Review Labs Reviewed  BASIC METABOLIC PANEL - Abnormal; Notable for the following:    Creatinine, Ser 0.48 (*)    All other components within normal limits  URINALYSIS, ROUTINE W REFLEX MICROSCOPIC - Abnormal; Notable for the following:    APPearance CLOUDY (*)    All other components within normal limits  CBC WITH DIFFERENTIAL  URINE RAPID DRUG SCREEN (HOSP PERFORMED)    Imaging Review Ct Head Wo Contrast  02/11/2014   CLINICAL DATA:  Right-sided weakness.  Multiple sclerosis.  EXAM: CT HEAD WITHOUT  CONTRAST  TECHNIQUE: Contiguous axial images were obtained from the base of the skull through the vertex without intravenous contrast.  COMPARISON:  Head MRI 09/05/2013 and CT 04/22/2013  FINDINGS: Ventricles and sulci are within normal limits for age. There is no evidence of acute cortical infarct, intracranial hemorrhage, mass, midline shift, or extra-axial fluid collection. Extensive, patchy hypodensities throughout the cerebral white matter bilaterally are similar to the prior CT. Orbits are unremarkable. Mastoid air cells and visualized paranasal sinuses are clear.  IMPRESSION: 1. No evidence of acute intracranial abnormality. 2. Unchanged cerebral white matter disease.   Electronically Signed   By: Sebastian AcheAllen  Grady   On: 02/11/2014 18:43     EKG Interpretation   Date/Time:  Saturday February 11 2014 18:53:06 EDT Ventricular Rate:  80 PR Interval:  136 QRS Duration: 94 QT Interval:  380 QTC Calculation: 438 R Axis:   42 Text Interpretation:  Sinus rhythm Low voltage, precordial leads No  significant change since Confirmed by KOHUT  MD, STEPHEN ([id number]) on  02/11/2014 7:30:50 PM      MDM   Final diagnoses:  Acute right-sided weakness    38yF with R sided pain, weakness. Possible MS exacerbation. Less likely CVA. Outside of window for TPA even if so with symptoms starting last night. CT w/o acute abnormality. Will discuss with neruology.     Raeford Razor, MD 02/11/14 [phone]

## 2014-02-11 NOTE — Consult Note (Addendum)
Neurology Consultation Reason for Consult: Right-sided weakness and numbness Referring Physician: Juleen ChinaKohut, S  CC: Right-sided weakness and numbness  History is obtained from: Patient, family  HPI: Mary [name] is a 38 y.o. female with a history of MS diagnosed in January of this year. She is followed by Dr. Everlena CooperJaffe and is taking Copaxone. She was in her normal state of health up until yesterday at which point she began having numbness and weakness of her right leg which is progressed to involve her right arm as well. Her right face seems to be involved from a sensory perspective, but not from a motor perspective. She also describes a stutter which is new.  ROS: A 14 point ROS was performed and is negative except as noted in the HPI.  Past Medical History  Diagnosis Date  . Hernia 03-24-13    ventral hernia remains   . GERD (gastroesophageal reflux disease)   . Pancreatitis 03-24-13    2002, 2'2013, 03-14-13  . Pancreatic cyst 2002 onset  . Anxiety   . Depression     "recent breakup with partner of 15 yrs"  . Adrenal insufficiency 04/23/2013    ??  . MS (multiple sclerosis)     Family History: Mother-fibromyalgia  Social History: Tob: Negative, quit one year ago  Exam: Current vital signs: BP 123/82  Pulse 90  Temp(Src) 98.4 F (36.9 C) (Oral)  Resp 14  Ht 5\' 4"  (1.626 m)  Wt 111.131 kg (245 lb)  BMI 42.03 kg/m2  SpO2 100%  LMP 01/27/2014 Vital signs in last 24 hours: Temp:  [98.4 F (36.9 C)] 98.4 F (36.9 C) (06/27 1535) Pulse Rate:  [81-91] 90 (06/27 1645) Resp:  [14] 14 (06/27 1535) BP: (108-129)/(78-93) 123/82 mmHg (06/27 1645) SpO2:  [99 %-100 %] 100 % (06/27 1645) Weight:  [111.131 kg (245 lb)] 111.131 kg (245 lb) (06/27 1535)  General: In bed, NAD CV: Regular rate and rhythm Mental Status: Patient is awake, alert, oriented to person, place, month, year, and situation. Immediate and remote memory are intact. Patient is able to give a clear and coherent  history. No signs of aphasia or neglect No stuttering present during my examination. Cranial Nerves: II: Visual Fields are full. Pupils are equal, round, and reactive to light.  Discs are difficult to visualize. III,IV, VI: EOMI without ptosis or diploplia.  V: Facial sensation is decreased on the right to touch as well as vibration VII: Facial movement is symmetric.  VIII: hearing is intact to voice X: Uvula elevates symmetrically XI: Shoulder shrug is symmetric. XII: tongue is midline without atrophy or fasciculations.  Motor: Tone is normal. Bulk is normal. 5/5 strength was present on the left, she has 4/5 strength throughout the right side, though gives poor effort at times due to pain. Sensory: Sensation is decreased on the right Deep Tendon Reflexes: 2+ and symmetric in the biceps and patellae.  Cerebellar: FNF intact on the left, consistent with weakness on the right Gait: Not tested secondary to patient safety concerns.   I have reviewed labs in epic and the results pertinent to this consultation are: BMP-unremarkable  I have reviewed the images obtained: Previous MRI reviewed, multiple white matter lesions which could be consistent with multiple sclerosis.  Impression: 38 year old female with multiple sclerosis with new right-sided weakness and numbness. The very likely represents a multiple sclerosis flare and given her severity of symptoms, I think that inpatient treatment with IV steroids as well as physical therapy as needed.  Recommendations:  1) Solu-Medrol 1 g daily 2) Protonix while on Solu-Medrol as prophylaxis 3) continue Copaxone 4) physical therapy 5) MRI brain and C-spine with/without contrast 6) neurohospitalist service will continue to follow   Ritta Slot, MD [facility] [phone]  If 7pm- 7am, please page neurology on call as listed in AMION.

## 2014-02-12 ENCOUNTER — Inpatient Hospital Stay (HOSPITAL_COMMUNITY): Payer: Medicaid Other

## 2014-02-12 DIAGNOSIS — B370 Candidal stomatitis: Secondary | ICD-10-CM | POA: Diagnosis present

## 2014-02-12 LAB — COMPREHENSIVE METABOLIC PANEL
ALT: 33 U/L (ref 0–35)
AST: 28 U/L (ref 0–37)
Albumin: 3.4 g/dL — ABNORMAL LOW (ref 3.5–5.2)
Alkaline Phosphatase: 92 U/L (ref 39–117)
BUN: 10 mg/dL (ref 6–23)
CALCIUM: 8.7 mg/dL (ref 8.4–10.5)
CO2: 20 mEq/L (ref 19–32)
Chloride: 100 mEq/L (ref 96–112)
Creatinine, Ser: 0.48 mg/dL — ABNORMAL LOW (ref 0.50–1.10)
GFR calc non Af Amer: >90 mL/min (ref >90)
GLUCOSE: 170 mg/dL — AB (ref 70–99)
Potassium: 4.5 mEq/L (ref 3.7–5.3)
Sodium: 136 mEq/L — ABNORMAL LOW (ref 137–147)
Total Protein: 8 g/dL (ref 6.0–8.3)

## 2014-02-12 LAB — CBC
HEMATOCRIT: 35.1 % — AB (ref 36.0–46.0)
Hemoglobin: 11.5 g/dL — ABNORMAL LOW (ref 12.0–15.0)
MCH: 28.2 pg (ref 26.0–34.0)
MCHC: 32.8 g/dL (ref 30.0–36.0)
MCV: 86 fL (ref 78.0–100.0)
Platelets: 275 10*3/uL (ref 150–400)
RBC: 4.08 MIL/uL (ref 3.87–5.11)
RDW: 14.3 % (ref 11.5–15.5)
WBC: 6.8 10*3/uL (ref 4.0–10.5)

## 2014-02-12 MED ORDER — DIPHENHYDRAMINE HCL 25 MG PO CAPS
25.0000 mg | ORAL_CAPSULE | Freq: Once | ORAL | Status: DC
  Filled 2014-02-12: qty 1

## 2014-02-12 MED ORDER — HYDROMORPHONE HCL PF 1 MG/ML IJ SOLN
0.5000 mg | INTRAMUSCULAR | Status: DC | PRN
  Administered 2014-02-12 (×6): 1 mg via INTRAVENOUS
  Filled 2014-02-12 (×6): qty 1

## 2014-02-12 MED ORDER — LORAZEPAM 2 MG/ML IJ SOLN
1.0000 mg | Freq: Once | INTRAMUSCULAR | Status: AC
  Administered 2014-02-12: 1 mg via INTRAVENOUS
  Filled 2014-02-12: qty 1

## 2014-02-12 MED ORDER — DIPHENHYDRAMINE HCL 50 MG/ML IJ SOLN
12.5000 mg | Freq: Once | INTRAMUSCULAR | Status: AC
  Administered 2014-02-12: 12.5 mg via INTRAVENOUS
  Filled 2014-02-12: qty 1

## 2014-02-12 MED ORDER — GADOBENATE DIMEGLUMINE 529 MG/ML IV SOLN
20.0000 mL | Freq: Once | INTRAVENOUS | Status: AC | PRN
  Administered 2014-02-12: 20 mL via INTRAVENOUS

## 2014-02-12 MED ORDER — HYDROMORPHONE HCL PF 1 MG/ML IJ SOLN
1.0000 mg | INTRAMUSCULAR | Status: DC | PRN
  Administered 2014-02-12: 1 mg via INTRAVENOUS
  Administered 2014-02-12: 1.5 mg via INTRAVENOUS
  Administered 2014-02-13 (×2): 1 mg via INTRAVENOUS
  Filled 2014-02-12: qty 2
  Filled 2014-02-12 (×3): qty 1

## 2014-02-12 MED ORDER — DIPHENHYDRAMINE HCL 12.5 MG/5ML PO ELIX
25.0000 mg | ORAL_SOLUTION | Freq: Once | ORAL | Status: DC
  Filled 2014-02-12: qty 10

## 2014-02-12 MED ORDER — PANTOPRAZOLE SODIUM 40 MG IV SOLR
40.0000 mg | Freq: Every day | INTRAVENOUS | Status: DC
  Administered 2014-02-12 – 2014-02-14 (×4): 40 mg via INTRAVENOUS
  Filled 2014-02-12 (×4): qty 40

## 2014-02-12 MED ORDER — NYSTATIN 100000 UNIT/ML MT SUSP
5.0000 mL | Freq: Four times a day (QID) | OROMUCOSAL | Status: DC
  Administered 2014-02-12 – 2014-02-14 (×9): 500000 [IU] via ORAL
  Filled 2014-02-12 (×12): qty 5

## 2014-02-12 NOTE — Progress Notes (Signed)
Patient ambulated to bathroom using a walker with 1 person assist.

## 2014-02-12 NOTE — H&P (Signed)
Internal Medicine Attending Admission Note Date: 02/12/2014  Patient name: Mary [name] Medical record number: [id number] Date of birth: Jun 02, 1976 Age: 38 y.o. Gender: female  I saw and evaluated the patient. I reviewed the resident's note and I agree with the resident's findings and plan as documented in the resident's note, with the following additional comments.  Chief Complaint(s): Right-sided weakness and numbness/tingling  History - key components related to admission: Patient is a 38 year old woman with relapsing/remitting multiple sclerosis diagnosed 08/2013 by Dr. Everlena CooperJaffe and being treated with Copaxone, recurrent idiopathic pancreatitis, chronic pancreatitis with pseudocyst, and other problems as outlined in the medical history now admitted with right-sided weakness, numbness, and tingling with stuttering of speech which began one day prior to admission.  She also reports blurred vision on the right.   Physical Exam - key components related to admission:  Filed Vitals:   02/11/14 1630 02/11/14 1645 02/11/14 2312 02/12/14 0442  BP: 108/93 123/82 113/69 99/62  Pulse: 84 90 73 70  Temp:   97.6 F (36.4 C) 97.7 F (36.5 C)  TempSrc:   Oral Oral  Resp:   16 18  Height:   5\' 4"  (1.626 m)   Weight:   235 lb 3.2 oz (106.686 kg)   SpO2: 99% 100% 98% 93%    General: Alert, no acute distress Lungs: Clear Heart: Regular; no extra sounds or murmurs Abdomen: Bowel sounds present, soft, nontender Extremities: No edema Neurologic: Alert and oriented; cranial nerves exam notable for decreased right shoulder shrug; strength 4/5 right upper extremity and 4-/5 right lower extremity.  Lab results:   Basic Metabolic Panel:  Recent Labs  13/24/4006/27/15 1830 02/12/14 0705  NA 141 136*  K 3.9 4.5  CL 103 100  CO2 22 20  GLUCOSE 98 170*  BUN 8 10  CREATININE 0.48* 0.48*  CALCIUM 9.1 8.7    Liver Function Tests:  Recent Labs  02/12/14 0705  AST 28  ALT 33  ALKPHOS 92  BILITOT  <0.2*  PROT 8.0  ALBUMIN 3.4*     CBC:  Recent Labs  02/11/14 1830 02/12/14 0400  WBC 9.3 6.8  HGB 12.0 11.5*  HCT 37.2 35.1*  MCV 86.7 86.0  PLT 270 275    Recent Labs  02/11/14 1830  NEUTROABS 6.9  LYMPHSABS 1.7  MONOABS 0.6  EOSABS 0.1  BASOSABS 0.0     Urine Drug Screen: Drugs of Abuse     Component Value Date/Time   LABOPIA NONE DETECTED 02/11/2014 1801   COCAINSCRNUR NONE DETECTED 02/11/2014 1801   LABBENZ NONE DETECTED 02/11/2014 1801   AMPHETMU NONE DETECTED 02/11/2014 1801   THCU NONE DETECTED 02/11/2014 1801   LABBARB NONE DETECTED 02/11/2014 1801      Urinalysis    Component Value Date/Time   COLORURINE YELLOW 02/11/2014 1801   APPEARANCEUR CLOUDY* 02/11/2014 1801   LABSPEC 1.023 02/11/2014 1801   PHURINE 6.0 02/11/2014 1801   GLUCOSEU NEGATIVE 02/11/2014 1801   HGBUR NEGATIVE 02/11/2014 1801   BILIRUBINUR NEGATIVE 02/11/2014 1801   KETONESUR NEGATIVE 02/11/2014 1801   PROTEINUR NEGATIVE 02/11/2014 1801   UROBILINOGEN 0.2 02/11/2014 1801   NITRITE NEGATIVE 02/11/2014 1801   LEUKOCYTESUR NEGATIVE 02/11/2014 1801     Misc. Labs: Lab Results  Component Value Date   LATICACIDVEN 1.6 02/11/2014     Imaging results:  Ct Head Wo Contrast  02/11/2014   CLINICAL DATA:  Right-sided weakness.  Multiple sclerosis.  EXAM: CT HEAD WITHOUT CONTRAST  TECHNIQUE: Contiguous axial images were  obtained from the base of the skull through the vertex without intravenous contrast.  COMPARISON:  Head MRI 09/05/2013 and CT 04/22/2013  FINDINGS: Ventricles and sulci are within normal limits for age. There is no evidence of acute cortical infarct, intracranial hemorrhage, mass, midline shift, or extra-axial fluid collection. Extensive, patchy hypodensities throughout the cerebral white matter bilaterally are similar to the prior CT. Orbits are unremarkable. Mastoid air cells and visualized paranasal sinuses are clear.  IMPRESSION: 1. No evidence of acute intracranial abnormality. 2.  Unchanged cerebral white matter disease.   Electronically Signed   By: Sebastian Ache   On: 02/11/2014 18:43    Other results: EKG: Sinus rhythm; no acute change  Assessment & Plan by Problem:  1.  Multiple sclerosis exacerbation.  Neurology has been consulted, and advised treatment with IV Solu-Medrol and physical therapy.  Patient reports some improvement since admission and following initial Solu-Medrol treatment given last night.  An MRI of the head and cervical spine are pending.  Plans include physical therapy; await results of MRI; continue IV Solu-Medrol; neurology is following.  2.  Other problems and plans as per the resident physician's note.  3.  Dr. Dalphine Handing will return as attending physician tomorrow 02/13/2014.

## 2014-02-12 NOTE — Evaluation (Addendum)
Clinical/Bedside Swallow Evaluation Patient Details  Name: Mary [name] MRN: [id number] Date of Birth: February 21, 1976  Today's Date: 02/12/2014 Time: 1230-1300 SLP Time Calculation (min): 30 min  Past Medical History:  Past Medical History  Diagnosis Date  . Hernia 03-24-13    ventral hernia remains   . GERD (gastroesophageal reflux disease)   . Pancreatitis 03-24-13    2002, 2'2013, 03-14-13  . Pancreatic cyst 2002 onset  . Anxiety   . Depression     "recent breakup with partner of 15 yrs"  . Adrenal insufficiency 04/23/2013    ??  . MS (multiple sclerosis)    Past Surgical History:  Past Surgical History  Procedure Laterality Date  . Abdominal surgery  ~ 2007    some sort of pancreatic cyst drainage.   . Cholecystectomy      ~ age 34-laparoscopic  . Adenoidectomy    . 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 Eus N/A 04/14/2013    Procedure: UPPER ENDOSCOPIC ULTRASOUND (EUS) LINEAR;  Surgeon: Rachael Feeaniel P Jacobs, MD;  Location: [facility];  Service: Endoscopy;  Laterality: N/A;   HPI:  Patient is a 38 year old woman with relapsing/remitting multiple sclerosis diagnosed 08/2013 by Dr. Everlena CooperJaffe and being treated with Copaxone, recurrent idiopathic pancreatitis, chronic pancreatitis with pseudocyst, and other problems as outlined in the medical history now admitted with right-sided weakness, numbness, and tingling with stuttering of speech which began one day prior to admission.  She also reports blurred vision on the right.   Choking episode with "burrito" on 6/29.  Patient reports odynophagia since choking episode.  MRI pending.   Assessment / Plan / Recommendation Clinical Impression  BSE completed per Stroke Protocol.  Lingual area patchy.   No outward clinical s/s of aspiration noted throughout evaluation.  Patient reports discomfort during swallow with trials of solids with reports of "solids sticking.  Required multiple dry swallows  followed by sips of liquid to complete bolus transit. Points at level of approximately UES.    For comfort recommend clear liquid diet vs full liquid.  Patient stated she preferred getting medication via IV as to avoid attempting to swallow pills.   ST to f/u on 02/13/14 for diet tolerance and possible advancement to solids.  Completion of objective evaluation to be determined.      Aspiration Risk  Mild    Diet Recommendation Thin liquid   Liquid Administration via: Cup;Straw Medication Administration: Via alternative means Supervision: Intermittent supervision to cue for compensatory strategies Compensations: Small sips/bites Postural Changes and/or Swallow Maneuvers: Out of bed for meals;Seated upright 90 degrees;Upright 30-60 min after meal    Other  Recommendations Oral Care Recommendations: Oral care Q4 per protocol   Follow Up Recommendations   (TBD )    Frequency and Duration min 2x/week  2 weeks       SLP Swallow Goals Please refer to Care Plans for listed goals   Swallow Study Prior Functional Status  Type of Home: House Available Help at Discharge: Family;Available 24 hours/day    General Date of Onset: 02/11/14 HPI: Patient is a 38 year old woman with relapsing/remitting multiple sclerosis diagnosed 08/2013 by Dr. Everlena CooperJaffe and being treated with Copaxone, recurrent idiopathic pancreatitis, chronic pancreatitis with pseudocyst, and other problems as outlined in the medical history now admitted with right-sided weakness, numbness, and tingling with stuttering of speech which began one day prior to admission.  She also reports blurred vision on the right.  Type of Study: Bedside swallow evaluation Diet Prior to this Study: NPO Respiratory Status: Room air History of Recent Intubation: No Behavior/Cognition: Alert;Cooperative Oral Cavity - Dentition: Adequate natural dentition Self-Feeding Abilities: Able to feed self Patient Positioning: Upright in chair Baseline Vocal  Quality: Hoarse;Low vocal intensity Volitional Cough: Strong Volitional Swallow: Able to elicit    Oral/Motor/Sensory Function Overall Oral Motor/Sensory Function: Appears within functional limits for tasks assessed   Ice Chips Ice chips: Not tested   Thin Liquid Thin Liquid: Within functional limits Presentation: Cup;Straw    Nectar Thick Nectar Thick Liquid: Not tested   Honey Thick Honey Thick Liquid: Not tested   Puree Puree: Within functional limits   Solid   GO    Solid: Impaired Pharyngeal Phase Impairments: Other (comments) (reporting odynophagia )      Moreen Fowler MS, CCC-SLP [phone] [facility] 02/12/2014,2:34 PM

## 2014-02-12 NOTE — Progress Notes (Signed)
Pt refuses to let staff assist her when ambulating. Pt only wants family to touch her. Pt educated on risks of falling and need for staff to be with her. Pt still refuses and says she will call for [name] if no family is in the room.

## 2014-02-12 NOTE — Progress Notes (Signed)
Rehab Admissions Coordinator Note:  Patient was screened by Clois DupesBoyette, Barbara Godwin for appropriateness for an Inpatient Acute Rehab Consult.  At this time, we are recommending Inpatient Rehab consult. Please place order.  Clois DupesBoyette, Barbara Godwin 02/12/2014, 4:26 PM  I can be reached at [phone].

## 2014-02-12 NOTE — Progress Notes (Signed)
Subjective: Patient notes that she feels she is improving but is "no way near normal."  She notes she still has right side weakness, and mild numbness, the tingling she felt previously has improved greatly.  She denies any cough, chest pain, abdominal pain,or SOB. She does have anxiety about upcoming MRI but believes she will be able to get through it with Ativan and having friend at side. Objective: Vital signs in last 24 hours: Filed Vitals:   02/11/14 1630 02/11/14 1645 02/11/14 2312 02/12/14 0442  BP: 108/93 123/82 113/69 99/62  Pulse: 84 90 73 70  Temp:   97.6 F (36.4 C) 97.7 F (36.5 C)  TempSrc:   Oral Oral  Resp:   16 18  Height:   5\' 4"  (1.626 m)   Weight:   235 lb 3.2 oz (106.686 kg)   SpO2: 99% 100% 98% 93%   Weight change:  No intake or output data in the 24 hours ending 02/12/14 1249 General: resting in bed HEENT: PERRL, EOMI, scrapable while material on tongue Cardiac: RRR, no  murmurs  Pulm:CTAB Abd: soft, nontender, nondistended, BS present Ext: warm and well perfused, no pedal edema Neuro: alert and oriented X3, 4/5 RUE strength, 3/5 RLE strength, decreased sensation reported over right foot, right hand, and right V1,V2 dermatomes, decreased right should shrug.  Speech is clear.    Lab Results: Basic Metabolic Panel:  Recent Labs Lab 02/11/14 1830 02/12/14 0705  NA 141 136*  K 3.9 4.5  CL 103 100  CO2 22 20  GLUCOSE 98 170*  BUN 8 10  CREATININE 0.48* 0.48*  CALCIUM 9.1 8.7   Liver Function Tests:  Recent Labs Lab 02/12/14 0705  AST 28  ALT 33  ALKPHOS 92  BILITOT <0.2*  PROT 8.0  ALBUMIN 3.4*   CBC:  Recent Labs Lab 02/11/14 1830 02/12/14 0400  WBC 9.3 6.8  NEUTROABS 6.9  --   HGB 12.0 11.5*  HCT 37.2 35.1*  MCV 86.7 86.0  PLT 270 275   Urine Drug Screen: Drugs of Abuse     Component Value Date/Time   LABOPIA NONE DETECTED 02/11/2014 1801   COCAINSCRNUR NONE DETECTED 02/11/2014 1801   LABBENZ NONE DETECTED 02/11/2014 1801     AMPHETMU NONE DETECTED 02/11/2014 1801   THCU NONE DETECTED 02/11/2014 1801   LABBARB NONE DETECTED 02/11/2014 1801    Urinalysis:  Recent Labs Lab 02/11/14 1801  COLORURINE YELLOW  LABSPEC 1.023  PHURINE 6.0  GLUCOSEU NEGATIVE  HGBUR NEGATIVE  BILIRUBINUR NEGATIVE  KETONESUR NEGATIVE  PROTEINUR NEGATIVE  UROBILINOGEN 0.2  NITRITE NEGATIVE  LEUKOCYTESUR NEGATIVE   Micro Results: No results found for this or any previous visit (from the past 240 hour(s)). Studies/Results: Ct Head Wo Contrast  02/11/2014   CLINICAL DATA:  Right-sided weakness.  Multiple sclerosis.  EXAM: CT HEAD WITHOUT CONTRAST  TECHNIQUE: Contiguous axial images were obtained from the base of the skull through the vertex without intravenous contrast.  COMPARISON:  Head MRI 09/05/2013 and CT 04/22/2013  FINDINGS: Ventricles and sulci are within normal limits for age. There is no evidence of acute cortical infarct, intracranial hemorrhage, mass, midline shift, or extra-axial fluid collection. Extensive, patchy hypodensities throughout the cerebral white matter bilaterally are similar to the prior CT. Orbits are unremarkable. Mastoid air cells and visualized paranasal sinuses are clear.  IMPRESSION: 1. No evidence of acute intracranial abnormality. 2. Unchanged cerebral white matter disease.   Electronically Signed   By: Sebastian Ache   On:  02/11/2014 18:43   Medications: I have reviewed the patient's current medications. Scheduled Meds: . enoxaparin (LOVENOX) injection  40 mg Subcutaneous Q24H  . [START ON 02/13/2014] Glatiramer Acetate  1 Syringe Subcutaneous Once per day on Mon Wed Fri  . methylPREDNISolone (SOLU-MEDROL) injection  1,000 mg Intravenous Q24H  . nystatin  5 mL Oral QID  . pantoprazole (PROTONIX) IV  40 mg Intravenous Daily  . sodium chloride  3 mL Intravenous Q12H   Continuous Infusions:  PRN Meds:.sodium chloride, HYDROmorphone (DILAUDID) injection, ondansetron (ZOFRAN) IV, sodium  chloride Assessment/Plan:   Multiple sclerosis exacerbation - Neurology on board - MRI planned for today- to get ativan prior, if unable will need to do sedation MRI in AM - Continue IV Solumedrol 1g q24 for 3 days - PT/OT - Dilaudid 1 to 1.5mg  Q3PRN with hold parameters    GERD (gastroesophageal reflux disease) -Protonix   Thrush - Nystatin - HIV screen in process  DVT PPx: Lovenox Diet: Clear liquid (per SLP recs) Code: Full Dispo: Disposition is deferred at this time, awaiting improvement of current medical problems.  Anticipated discharge in approximately 3 day(s).   The patient does not have a current PCP (No Pcp Per Patient) and does not know [facility] after discharge.  The patient does not know have transportation limitations that hinder transportation to clinic appointments.  .Services Needed at time of discharge: Y = Yes, Blank = No PT:   OT:   RN:   Equipment:   Other:     LOS: 1 day   Carlynn PurlErik Hoffman, DO 02/12/2014, 12:49 PM

## 2014-02-12 NOTE — Evaluation (Signed)
Physical Therapy Evaluation Patient Details Name: Mary [name] MRN: [id number] DOB: [birth year] Today's Date: 02/12/2014   History of Present Illness  Mary MeHaley M Cutsforth is a [age] year old [place] woman with past medical history of newly diagnosed relapsing-remitting MS, chronic pancreatitis, anxiety, depression, and GERD who presents with right sided numbness/tingling, pain, and weakness   Clinical Impression  Pt admitted with exacerbation of multiple sclerosis, recently diagnosed 08/2013. Pt currently with functional limitations due to the deficits listed below (see PT Problem List). Very little muscle activation (<3/5 MMT throughout RLE) with no light touch sensation and significant drop foot during ambulation. Pt will require AFO for safe mobility. Pt will benefit from skilled PT to increase their independence and safety with mobility to allow discharge to the venue listed below. Feel that pt may be a strong candidate for CIR to further increase her functional mobility prior to going home with family support.      Follow Up Recommendations CIR    Equipment Recommendations  Rolling walker with 5" wheels;3in1 (PT);Wheelchair (measurements PT);Wheelchair cushion (measurements PT);Other (comment) (AFO)    Recommendations for Other Services OT consult;Rehab consult;Speech consult     Precautions / Restrictions Precautions Precautions: Fall      Mobility  Bed Mobility Overal bed mobility: Needs Assistance Bed Mobility: Supine to Sit     Supine to sit: Min guard;HOB elevated     General bed mobility comments: Min guard for safety with HOB elevated. VCs for technique  Transfers Overall transfer level: Needs assistance Equipment used: Rolling walker (2 wheeled) Transfers: Sit to/from Stand Sit to Stand: Min guard         General transfer comment: Close contact guard for safety. Educated on hand placement. Performed from lowest bed setting and  BSC.  Ambulation/Gait Ambulation/Gait assistance: Min assist Ambulation Distance (Feet): 25 Feet (additional bout of 15 feet.) Assistive device: Rolling walker (2 wheeled) Gait Pattern/deviations: Step-to pattern;Decreased step length - right;Decreased stance time - right;Decreased stride length;Decreased dorsiflexion - right;Decreased weight shift to right   Gait velocity interpretation: Below normal speed for age/gender General Gait Details: Pt essentially dragging Rt foot, no dorsiflexion noted. Some ability to flex hip forward. Min assist to advance RLE for correct placement. Pt bears some weight through RUE but reports difficulty due to weakness. Educated on safe DME use.  Stairs            Wheelchair Mobility    Modified Rankin (Stroke Patients Only)       Balance Overall balance assessment: Needs assistance Sitting-balance support: No upper extremity supported;Feet supported Sitting balance-Leahy Scale: Fair     Standing balance support: Single extremity supported Standing balance-Leahy Scale: Poor                               Pertinent Vitals/Pain Pt with no pain at this time Patient repositioned in chair for comfort.     Home Living Family/patient expects to be discharged to:: Private residence Living Arrangements: Other relatives (Mother in-law) Available Help at Discharge: Family;Available 24 hours/day Type of Home: House Home Access: Ramped entrance     Home Layout: One level Home Equipment: Cane - single point;Grab bars - tub/shower (tall toilets)      Prior Function Level of Independence: Independent with assistive device(s)         Comments: Intermittent use of cane during previous exacerbation     Hand Dominance   Dominant Hand: Right  Extremity/Trunk Assessment   Upper Extremity Assessment: Defer to OT evaluation           Lower Extremity Assessment: RLE deficits/detail RLE Deficits / Details: Strength <3/5  with hip flexion, knee extension/flexion, ankle plantar/dorsiflexion.  (No sensation of light touch throughout entire RLE)       Communication   Communication: No difficulties  Cognition Arousal/Alertness: Awake/alert Behavior During Therapy: WFL for tasks assessed/performed Overall Cognitive Status: Within Functional Limits for tasks assessed                      General Comments      Exercises        Assessment/Plan    PT Assessment Patient needs continued PT services  PT Diagnosis Difficulty walking;Abnormality of gait;Hemiplegia dominant side   PT Problem List Decreased strength;Decreased range of motion;Decreased activity tolerance;Decreased balance;Decreased mobility;Decreased coordination;Decreased knowledge of use of DME;Impaired sensation;Obesity  PT Treatment Interventions DME instruction;Gait training;Functional mobility training;Therapeutic activities;Therapeutic exercise;Balance training;Neuromuscular re-education;Patient/family education;Modalities   PT Goals (Current goals can be found in the Care Plan section) Acute Rehab PT Goals Patient Stated Goal: go to rehab PT Goal Formulation: With patient Time For Goal Achievement: 02/19/14 Potential to Achieve Goals: Good    Frequency Min 4X/week   Barriers to discharge Other (comment) Pt at low level of function requiring more assistance than family can safely provide    Co-evaluation               End of Session   Activity Tolerance: Patient tolerated treatment well Patient left: in chair;with call bell/phone within reach;with family/visitor present Nurse Communication: Mobility status         Time: 0865-7846 PT Time Calculation (min): 26 min   Charges:   PT Evaluation $Initial PT Evaluation Tier I: 1 Procedure PT Treatments $Gait Training: 8-22 mins   PT G Codes:         [employer], [place] [phone]  [place] Mount 02/12/2014, 2:10 PM

## 2014-02-12 NOTE — Progress Notes (Signed)
Subjective: No complaints. Speech has improved since steroids were started. Able to ambulate slightly better with use of a walker and physical therapy.  Objective: Current vital signs: BP 99/62  Pulse 70  Temp(Src) 97.7 F (36.5 C) (Oral)  Resp 18  Ht 5\' 4"  (1.626 m)  Wt 106.686 kg (235 lb 3.2 oz)  BMI 40.35 kg/m2  SpO2 93%  LMP 01/27/2014  Neurologic Exam: Alert and in no acute distress. Mental status was normal. Speech was normal.  Medications: I have reviewed the patient's current medications.  Assessment/Plan: Multiple sclerosis exacerbation admitted for high-dose steroid treatment for 3 days as well as physical therapy intervention. Patient has improved slightly since admission. MRI of the brain is pending.  Recommend no changes in current management. We will continue to follow this patient with you.  C.R. Roseanne Reno, MD [facility] [phone]  02/12/2014  12:32 PM

## 2014-02-13 DIAGNOSIS — M79609 Pain in unspecified limb: Secondary | ICD-10-CM

## 2014-02-13 DIAGNOSIS — G8110 Spastic hemiplegia affecting unspecified side: Secondary | ICD-10-CM

## 2014-02-13 DIAGNOSIS — M549 Dorsalgia, unspecified: Secondary | ICD-10-CM

## 2014-02-13 DIAGNOSIS — F411 Generalized anxiety disorder: Secondary | ICD-10-CM

## 2014-02-13 LAB — BASIC METABOLIC PANEL
BUN: 14 mg/dL (ref 6–23)
BUN: 14 mg/dL (ref 6–23)
CALCIUM: 8.7 mg/dL (ref 8.4–10.5)
CALCIUM: 9 mg/dL (ref 8.4–10.5)
CO2: 18 mEq/L — ABNORMAL LOW (ref 19–32)
CO2: 24 meq/L (ref 19–32)
CREATININE: 0.43 mg/dL — AB (ref 0.50–1.10)
CREATININE: 0.46 mg/dL — AB (ref 0.50–1.10)
Chloride: 101 mEq/L (ref 96–112)
Chloride: 99 mEq/L (ref 96–112)
GFR calc Af Amer: >90 mL/min (ref >90)
GFR calc non Af Amer: >90 mL/min (ref >90)
GFR calc non Af Amer: >90 mL/min (ref >90)
Glucose, Bld: 173 mg/dL — ABNORMAL HIGH (ref 70–99)
Glucose, Bld: 168 mg/dL — ABNORMAL HIGH (ref 70–99)
Potassium: 6.3 mEq/L — ABNORMAL HIGH (ref 3.7–5.3)
Potassium: 4.5 mEq/L (ref 3.7–5.3)
Sodium: 134 mEq/L — ABNORMAL LOW (ref 137–147)
Sodium: 137 mEq/L (ref 137–147)

## 2014-02-13 LAB — HIV ANTIBODY (ROUTINE TESTING W REFLEX): HIV 1&2 Ab, 4th Generation: NONREACTIVE

## 2014-02-13 MED ORDER — GLATIRAMER ACETATE 40 MG/ML ~~LOC~~ SOSY: 40.0000 mg | PREFILLED_SYRINGE | SUBCUTANEOUS | Status: DC

## 2014-02-13 MED ORDER — GLATIRAMER ACETATE 40 MG/ML ~~LOC~~ SOSY
40.0000 mg | PREFILLED_SYRINGE | SUBCUTANEOUS | Status: DC
  Administered 2014-02-13: 40 mg via SUBCUTANEOUS
  Filled 2014-02-13: qty 1

## 2014-02-13 MED ORDER — BACLOFEN 10 MG PO TABS: 5.0000 mg | ORAL_TABLET | Freq: Three times a day (TID) | ORAL | Status: DC

## 2014-02-13 MED ORDER — HYDROMORPHONE HCL PF 1 MG/ML IJ SOLN
0.5000 mg | INTRAMUSCULAR | Status: DC | PRN
  Administered 2014-02-13: 1 mg via INTRAVENOUS
  Filled 2014-02-13 (×2): qty 1

## 2014-02-13 MED ORDER — DIPHENHYDRAMINE HCL 50 MG/ML IJ SOLN
12.5000 mg | Freq: Once | INTRAMUSCULAR | Status: AC
  Administered 2014-02-13: 12.5 mg via INTRAVENOUS
  Filled 2014-02-13: qty 1

## 2014-02-13 MED ORDER — HYDROMORPHONE HCL PF 1 MG/ML IJ SOLN: 1.0000 mg | INTRAMUSCULAR | Status: DC | PRN

## 2014-02-13 MED ORDER — HYDROXYZINE HCL 25 MG PO TABS
50.0000 mg | ORAL_TABLET | Freq: Four times a day (QID) | ORAL | Status: DC | PRN
  Administered 2014-02-13 – 2014-02-14 (×4): 50 mg via ORAL
  Filled 2014-02-13 (×4): qty 2

## 2014-02-13 MED ORDER — CYCLOBENZAPRINE HCL 10 MG PO TABS
5.0000 mg | ORAL_TABLET | Freq: Three times a day (TID) | ORAL | Status: DC | PRN
  Administered 2014-02-13 – 2014-02-14 (×3): 5 mg via ORAL
  Filled 2014-02-13 (×3): qty 1

## 2014-02-13 MED ORDER — HYDROMORPHONE HCL PF 1 MG/ML IJ SOLN
0.5000 mg | INTRAMUSCULAR | Status: DC | PRN
  Administered 2014-02-13 – 2014-02-14 (×9): 0.5 mg via INTRAVENOUS
  Filled 2014-02-13 (×10): qty 1

## 2014-02-13 NOTE — Progress Notes (Signed)
Speech Language Pathology Treatment: Dysphagia  Patient Details Name: Mary [name] MRN: [id number] DOB: 11/26/1975 Today's Date: 02/13/2014 Time: 7342-8768 SLP Time Calculation (min): 40 min  Assessment / Plan / Recommendation Clinical Impression  Pt crying when SLP entered room.  All lights were off and blinds closed.  Stated she was upset because staff would not leave her alone and she hasn't slept for more than 3 hours at a time.  Pt also complained of pain in her left side.  Pt reports she is hungry but was reluctant to attempt solid foods.  With encouragement, pt ate one bite of bread and a piece of cracker.  Grimmacing noted with each swallow, but good laryngeal elevation palpated.  Occasional dry cough noted after swallowing. Occasional belching was observed after swallows. Pt reports globus sensation, pointing to hyoid bone.  She is willing to attempt a Dysphagia 2 (chopped) diet, but did not want to try Dys 3 (mechanical soft).  Pt requests vegetarian meals and states she's craving cottage cheese.  If pt continues to feel food is sticking, pt may benefit from an Esophagram. Question if pt might benefit from psych consult.  Pt has tolerated clear liquids without s/s of aspiration.   HPI HPI: Patient is a 38 year old woman with relapsing/remitting multiple sclerosis diagnosed 08/2013 by Dr. Everlena Cooper and being treated with Copaxone, recurrent idiopathic pancreatitis, chronic pancreatitis with pseudocyst, and other problems as outlined in the medical history now admitted with right-sided weakness, numbness, and tingling with stuttering of speech which began one day prior to admission.  She also reports blurred vision on the right.   Pertinent Vitals C/o pain in left side.  RN notified.  Pt is afebrile.     SLP Plan  Continue with current plan of care    Recommendations Diet recommendations: Dysphagia 2 (fine chop);Thin liquid Liquids provided via: Straw;Cup Medication Administration: Whole  meds with liquid Supervision: Intermittent supervision to cue for compensatory strategies Compensations: Slow rate;Small sips/bites Postural Changes and/or Swallow Maneuvers: Seated upright 90 degrees;Upright 30-60 min after meal              Oral Care Recommendations: Oral care BID Follow up Recommendations: Inpatient Rehab Plan: Continue with current plan of care    GO     Mary [name] T 02/13/2014, 12:55 PM

## 2014-02-13 NOTE — Progress Notes (Addendum)
Pt crying in pain, Dilaudid not available until 1405. MD paged x2.  Dilaudid changed to 0.5 mg q 2 hours.  Pt seemed agreeable to changes.

## 2014-02-13 NOTE — Progress Notes (Signed)
Subjective: Patient feels slightly stronger on her right UE but states her right LE remains weak.  Still feels decreased sensation on her right aspect of her body  Objective: Current vital signs: BP 111/63  Pulse 77  Temp(Src) 97.5 F (36.4 C) (Oral)  Resp 20  Ht 5\' 4"  (1.626 m)  Wt 106.686 kg (235 lb 3.2 oz)  BMI 40.35 kg/m2  SpO2 97%  LMP 01/27/2014 Vital signs in last 24 hours: Temp:  [97.4 F (36.3 C)-98.1 F (36.7 C)] 97.5 F (36.4 C) (06/29 0939) Pulse Rate:  [55-98] 77 (06/29 0939) Resp:  [18-20] 20 (06/29 0939) BP: (92-116)/(52-71) 111/63 mmHg (06/29 0939) SpO2:  [92 %-97 %] 97 % (06/29 0939)  Intake/Output from previous day:   Intake/Output this shift:   Nutritional status: Clear Liquid  Neurologic Exam: Mental Status: Alert, oriented, thought content appropriate.  Speech fluent without evidence of aphasia.  Able to follow 3 step commands without difficulty. Cranial Nerves: II:  Visual fields grossly normal, pupils equal, round, reactive to light and accommodation III,IV, VI: ptosis present left eye, extra-ocular motions intact bilaterally V,VII: smile symmetric, facial light touch sensation normal bilaterally VIII: hearing normal bilaterally IX,X: gag reflex present XI: bilateral shoulder shrug XII: midline tongue extension without atrophy or fasciculations  Motor: Moves bilateral UE with 5/5 on the left and 4/5 with significant give way strength on the right.  Refuses to move her right leg but when distracted will move leg side to side and flex at knee on the bed.  Moves left LE 5/5.  Positive Hoovers.  Sensory: Pinprick and light touch decreased on the right face, arm trunk and leg, split midline throughout body.  Deep Tendon Reflexes:  Right: Upper Extremity   Left: Upper extremity   biceps (C-5 to C-6) 2/4   biceps (C-5 to C-6) 2/4 tricep (C7) 2/4    triceps (C7) 2/4 Brachioradialis (C6) 2/4  Brachioradialis (C6) 2/4  Lower Extremity Lower Extremity   quadriceps (L-2 to L-4) 2/4   quadriceps (L-2 to L-4) 2/4 Achilles (S1) 2/4   Achilles (S1) 2/4  Plantars: Right: downgoing   Left: downgoing    Lab Results: Basic Metabolic Panel:  Recent Labs Lab 02/11/14 1830 02/12/14 0705 02/13/14 0531 02/13/14 0749  NA 141 136* 134* 137  K 3.9 4.5 6.3* 4.5  CL 103 100 101 99  CO2 22 20 18* 24  GLUCOSE 98 170* 173* 168*  BUN 8 10 14 14   CREATININE 0.48* 0.48* 0.43* 0.46*  CALCIUM 9.1 8.7 8.7 9.0    Liver Function Tests:  Recent Labs Lab 02/12/14 0705  AST 28  ALT 33  ALKPHOS 92  BILITOT <0.2*  PROT 8.0  ALBUMIN 3.4*   No results found for this basename: LIPASE, AMYLASE,  in the last 168 hours No results found for this basename: AMMONIA,  in the last 168 hours  CBC:  Recent Labs Lab 02/11/14 1830 02/12/14 0400  WBC 9.3 6.8  NEUTROABS 6.9  --   HGB 12.0 11.5*  HCT 37.2 35.1*  MCV 86.7 86.0  PLT 270 275    Cardiac Enzymes: No results found for this basename: CKTOTAL, CKMB, CKMBINDEX, TROPONINI,  in the last 168 hours  Lipid Panel: No results found for this basename: CHOL, TRIG, HDL, CHOLHDL, VLDL, LDLCALC,  in the last 168 hours  CBG: No results found for this basename: GLUCAP,  in the last 168 hours  Microbiology: Results for orders placed during the hospital encounter of 04/14/13  SURGICAL  PCR SCREEN     Status: None   Collection Time    04/15/13 12:56 AM      Result Value Ref Range Status   MRSA, PCR NEGATIVE  NEGATIVE Final   Staphylococcus aureus NEGATIVE  NEGATIVE Final   Comment:            The Xpert SA Assay (FDA     approved for NASAL specimens     in patients over 37 years of age),     is one component of     a comprehensive surveillance     program.  Test performance has     been validated by [facility] for patients greater     than or equal to 37 year old.     It is not intended     to diagnose infection nor to     guide or monitor treatment.  GRAM STAIN     Status: None    Collection Time    04/25/13 11:20 AM      Result Value Ref Range Status   Specimen Description CSF   Final   Special Requests NONE   Final   Gram Stain     Final   Value: NO ORGANISMS SEEN     NO WBC SEEN     Gram Stain Report Called to,Read Back By and Verified With: BLOCK,D. AT 1321 ON 09.08.14 BY LOVE,T.   Report Status 04/25/2013 FINAL   Final    Coagulation Studies: No results found for this basename: LABPROT, INR,  in the last 72 hours  Imaging: Ct Head Wo Contrast  02/11/2014   CLINICAL DATA:  Right-sided weakness.  Multiple sclerosis.  EXAM: CT HEAD WITHOUT CONTRAST  TECHNIQUE: Contiguous axial images were obtained from the base of the skull through the vertex without intravenous contrast.  COMPARISON:  Head MRI 09/05/2013 and CT 04/22/2013  FINDINGS: Ventricles and sulci are within normal limits for age. There is no evidence of acute cortical infarct, intracranial hemorrhage, mass, midline shift, or extra-axial fluid collection. Extensive, patchy hypodensities throughout the cerebral white matter bilaterally are similar to the prior CT. Orbits are unremarkable. Mastoid air cells and visualized paranasal sinuses are clear.  IMPRESSION: 1. No evidence of acute intracranial abnormality. 2. Unchanged cerebral white matter disease.   Electronically Signed   By: Sebastian AcheAllen  Grady   On: 02/11/2014 18:43   Mr Brain W Wo Contrast  02/12/2014   CLINICAL DATA:  38 year old female with newly diagnosed relapsing remitting multiple sclerosis. Right side numbness tingling pain in weakness x1 day. Initial encounter. History of chronic pancreatitis.  EXAM: MRI HEAD WITHOUT AND WITH CONTRAST  TECHNIQUE: Multiplanar, multiecho pulse sequences of the brain and surrounding structures were obtained without and with intravenous contrast.  CONTRAST:  20mL MULTIHANCE GADOBENATE DIMEGLUMINE 529 MG/ML IV SOLN in conjunction with contrast enhanced imaging of the cervical spine reported separately.  COMPARISON:  Brain  MRI 09/05/2013, 04/22/2013.  FINDINGS: Stable cerebral volume. Widespread abnormal cerebral white matter T2 and FLAIR hyperintensity re- identified. Confluent involvement of the pons again noted. Mild involvement in the right cerebellar white matter again noted. Comparing sagittal FLAIR imaging, the extent of disease is stable since 04/22/2013. No lesions with restricted diffusion. No enhancing lesions.  Underlying normal cerebral volume. No restricted diffusion to suggest acute infarction. No midline shift, mass effect, evidence of mass lesion, ventriculomegaly, extra-axial collection or acute intracranial hemorrhage. Cervicomedullary junction and pituitary are within normal limits. Major intracranial vascular  flow voids are stable.  Cervical spine reported separately.  No new signal abnormality identified. Deep gray matter nuclei and medulla remain spared. No cortical encephalomalacia.  Visible internal auditory structures appear normal. Visualized paranasal sinuses and mastoids are clear. Grossly normal orbits soft tissues. Normal bone marrow signal. Visualized scalp soft tissues are within normal limits.  IMPRESSION: 1. Abnormal cerebral white matter, cerebellar white matter, and pontine signal abnormality compatible with multiple sclerosis is stable since 04/22/2013. No new lesions and no evidence of acute demyelination identified. 2. No new intracranial abnormality. 3. Cervical spine MRI today reported separately.   Electronically Signed   By: Augusto Gamble M.D.   On: 02/12/2014 15:21   Mr Cervical Spine W Wo Contrast  02/12/2014   CLINICAL DATA:  Multiple sclerosis.  EXAM: MRI CERVICAL SPINE WITHOUT AND WITH CONTRAST  TECHNIQUE: Multiplanar and multiecho pulse sequences of the cervical spine, to include the craniocervical junction and cervicothoracic junction, were obtained according to standard protocol without and with intravenous contrast.  CONTRAST:  20mL MULTIHANCE GADOBENATE DIMEGLUMINE 529 MG/ML IV SOLN   COMPARISON:  09/05/2013.  FINDINGS: Alignment: Mild levoconvex torticollis.  No spondylolisthesis.  Vertebrae: Normal. Marrow signal in the visualized skull sub appears normal.  Cord: Normal. No intramedullary lesions, atrophy, or edema. No myelomalacia at to suggest prior episodes of demyelinating disease.  Posterior Fossa: Deferred to MRI brain today.  Vertebral Arteries: Flow voids present bilaterally.  Paraspinal tissues: Normal.  Disc levels:  C2-C3:  Negative.  C3-C4:  Minimal bulge.  No stenosis.  C4-C5:  Minimal bulge.  No stenosis.  C5-C6:  Negative.  C6-C7:  Negative.  C7-T1: Negative. Visualized upper thoracic levels also appear normal.  After gadolinium administration, there is no pathologic enhancement.  IMPRESSION: Normal MRI of the cervical spine with and without contrast. No change from prior. No evidence of multiple sclerosis in the cervical spine.   Electronically Signed   By: Andreas Newport M.D.   On: 02/12/2014 15:00    Medications:  Scheduled: . enoxaparin (LOVENOX) injection  40 mg Subcutaneous Q24H  . Glatiramer Acetate  40 mg Subcutaneous Q M,W,F  . methylPREDNISolone (SOLU-MEDROL) injection  1,000 mg Intravenous Q24H  . nystatin  5 mL Oral QID  . pantoprazole (PROTONIX) IV  40 mg Intravenous Daily  . sodium chloride  3 mL Intravenous Q12H    Assessment/Plan:  Multiple sclerosis exacerbation admitted for high-dose steroid treatment for 3 days as well as physical therapy intervention.  CIR evaluating patient.  Will likely need SNF on discharge if not CIR candidate. Last dose solumedrol today at 2100 hours.   Will follow.   Felicie Morn PA-C [facility] [phone]  02/13/2014, 10:13 AM

## 2014-02-13 NOTE — Progress Notes (Signed)
UR complete.  Courtney Robarge RN, MSN 

## 2014-02-13 NOTE — Progress Notes (Signed)
Physical Therapy Treatment Patient Details Name: Mary MeHaley M Poke MRN: [id number] DOB: [birth year] Today's Date: 02/13/2014    History of Present Illness Mary [name] is a 38 year old pleasant woman with past medical history of newly diagnosed relapsing-remitting MS, chronic pancreatitis, anxiety, depression, and GERD who presents with right sided numbness/tingling, pain, and weakness.  She is being treated for pain and MS flare.  Pt also reports hernia that is painful (for gait belt purposes).     PT Comments    Pt is progressing slowly with mobility.  She continues to have right foot drag during gait, however, her LE MMT preformed EOB is inconsistent with her current gait pattern (she looks stronger walking than the 2+/5 MMT in knee and hip in supine and sitting EOB.  She functionally demonstrated equal and strong upper extremity strength as she used the RW to un weight her legs during gait. She reports significant internet research on MS symptoms during our session. PT will continue to follow acutely to treat her presenting symptoms.    Follow Up Recommendations  CIR     Equipment Recommendations  Rolling walker with 5" wheels;3in1 (PT)    Recommendations for Other Services Rehab consult;OT consult     Precautions / Restrictions Precautions Precautions: Fall Precaution Comments: right leg weakness    Mobility  Bed Mobility Overal bed mobility: Modified Independent Bed Mobility: Supine to Sit           General bed mobility comments: Mod I due to slow speed of movement  Transfers Overall transfer level: Needs assistance Equipment used: Rolling walker (2 wheeled) Transfers: Sit to/from Stand Sit to Stand: Min guard         General transfer comment: Min guard assist with RW.  Pt pushing up from sitting surface appropriately, verbal cues for safe hand placement during transition to sit  Ambulation/Gait Ambulation/Gait assistance: Min guard Ambulation Distance (Feet):  75 Feet Assistive device: Rolling walker (2 wheeled) Gait Pattern/deviations: Step-through pattern;Decreased step length - right;Decreased stance time - right;Decreased dorsiflexion - right;Decreased weight shift to right;Trunk flexed Gait velocity: decreased Gait velocity interpretation: Below normal speed for age/gender General Gait Details: Pt has inconsistant gait pattern vs. MMT.  Trace ankle DF, 2+/5 knee extension, however, with gait her only observed deficit is the foot drag.  She does not buckle over the right knee or snap it back into hyperextension with stance.  She slowly progresses the hip forward, but there is no trendelenberg pattern.  She shows 5/5 strength in the right upper extremity because she is bearing weight equally on bil arms to unweight her leg during gait.  Flexed posture.  Verbal cues for safe use of RW and upright posture.           Balance Overall balance assessment: Needs assistance Sitting-balance support: Feet supported;No upper extremity supported Sitting balance-Leahy Scale: Good     Standing balance support: Single extremity supported Standing balance-Leahy Scale: Poor Standing balance comment: needs support even in bathroom with one upper extremity for balance.                               Pertinent Vitals/Pain See vitals flow sheet.            PT Goals (current goals can now be found in the care plan section) Acute Rehab PT Goals Patient Stated Goal: go to rehab Progress towards PT goals: Progressing toward goals    Frequency  Min 4X/week    PT Plan Current plan remains appropriate       End of Session Equipment Utilized During Treatment: Gait belt Activity Tolerance: Patient limited by fatigue;Patient limited by pain Patient left: in chair;with call bell/phone within reach (RN reports no chair alarm needed)     Time: 1703-1730 PT Time Calculation (min): 27 min  Charges:  $Gait Training: 8-22 mins $Therapeutic  Activity: 8-22 mins                     Diala Waxman B. Camil Wilhelmsen, PT, DPT [phone]   02/13/2014, 6:04 PM

## 2014-02-13 NOTE — Progress Notes (Signed)
Subjective: Patient is anxious about her pain medications she reports that she would like to receive 1.5 mg of IV dilaudid, she did not realize that she had received 1.5 mg doses overnight. She reports she is still having some back pain and right leg pain but is not clear in her description of location or quality of pain. 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  Speech is clear.    Lab Results: Basic Metabolic Panel:  Recent Labs Lab 02/13/14 0531 02/13/14 0749  NA 134* 137  K 6.3* 4.5  CL 101 99  CO2 18* 24  GLUCOSE 173* 168*  BUN 14 14  CREATININE 0.43* 0.46*  CALCIUM 8.7 9.0   Liver Function Tests:  Recent Labs Lab 02/12/14 0705  AST 28  ALT 33  ALKPHOS 92  BILITOT <0.2*  PROT 8.0  ALBUMIN 3.4*   CBC:  Recent Labs Lab 02/11/14 1830 02/12/14 0400  WBC 9.3 6.8  NEUTROABS 6.9  --   HGB 12.0 11.5*  HCT 37.2 35.1*  MCV 86.7 86.0  PLT 270 275   Urine Drug Screen: Drugs of Abuse     Component Value Date/Time   LABOPIA NONE DETECTED 02/11/2014 1801   COCAINSCRNUR NONE DETECTED 02/11/2014 1801   LABBENZ NONE  DETECTED 02/11/2014 1801   AMPHETMU NONE DETECTED 02/11/2014 1801   THCU NONE DETECTED 02/11/2014 1801   LABBARB NONE DETECTED 02/11/2014 1801    Urinalysis:  Recent Labs Lab 02/11/14 1801  COLORURINE YELLOW  LABSPEC 1.023  PHURINE 6.0  GLUCOSEU NEGATIVE  HGBUR NEGATIVE  BILIRUBINUR NEGATIVE  KETONESUR NEGATIVE  PROTEINUR NEGATIVE  UROBILINOGEN 0.2  NITRITE NEGATIVE  LEUKOCYTESUR NEGATIVE   Micro Results: No results found for this or any previous visit (from the past 240 hour(s)). Studies/Results: Ct Head Wo Contrast  02/11/2014   CLINICAL DATA:  Right-sided weakness.  Multiple sclerosis.  EXAM: CT HEAD WITHOUT CONTRAST  TECHNIQUE: Contiguous axial images were obtained from the base of the skull through the vertex without intravenous contrast.  COMPARISON:  Head MRI 09/05/2013 and CT 04/22/2013  FINDINGS: Ventricles and sulci are within normal limits for age. There is no evidence of acute cortical infarct, intracranial hemorrhage, mass, midline shift, or extra-axial fluid collection. Extensive, patchy hypodensities throughout the cerebral white matter bilaterally are similar to the prior CT. Orbits are unremarkable. Mastoid air cells and visualized paranasal sinuses are clear.  IMPRESSION: 1. No evidence of acute intracranial abnormality. 2. Unchanged cerebral white matter disease.   Electronically Signed   By: Sebastian Ache   On:  02/11/2014 18:43   Mr Laqueta Jean ZO Contrast  02/12/2014   CLINICAL DATA:  38 year old female with newly diagnosed relapsing remitting multiple sclerosis. Right side numbness tingling pain in weakness x1 day. Initial encounter. History of chronic pancreatitis.  EXAM: MRI HEAD WITHOUT AND WITH CONTRAST  TECHNIQUE: Multiplanar, multiecho pulse sequences of the brain and surrounding structures were obtained without and with intravenous contrast.  CONTRAST:  20mL MULTIHANCE GADOBENATE DIMEGLUMINE 529 MG/ML IV SOLN in conjunction with contrast enhanced imaging of the  cervical spine reported separately.  COMPARISON:  Brain MRI 09/05/2013, 04/22/2013.  FINDINGS: Stable cerebral volume. Widespread abnormal cerebral white matter T2 and FLAIR hyperintensity re- identified. Confluent involvement of the pons again noted. Mild involvement in the right cerebellar white matter again noted. Comparing sagittal FLAIR imaging, the extent of disease is stable since 04/22/2013. No lesions with restricted diffusion. No enhancing lesions.  Underlying normal cerebral volume. No restricted diffusion to suggest acute infarction. No midline shift, mass effect, evidence of mass lesion, ventriculomegaly, extra-axial collection or acute intracranial hemorrhage. Cervicomedullary junction and pituitary are within normal limits. Major intracranial vascular flow voids are stable.  Cervical spine reported separately.  No new signal abnormality identified. Deep gray matter nuclei and medulla remain spared. No cortical encephalomalacia.  Visible internal auditory structures appear normal. Visualized paranasal sinuses and mastoids are clear. Grossly normal orbits soft tissues. Normal bone marrow signal. Visualized scalp soft tissues are within normal limits.  IMPRESSION: 1. Abnormal cerebral white matter, cerebellar white matter, and pontine signal abnormality compatible with multiple sclerosis is stable since 04/22/2013. No new lesions and no evidence of acute demyelination identified. 2. No new intracranial abnormality. 3. Cervical spine MRI today reported separately.   Electronically Signed   By: Augusto Gamble M.D.   On: 02/12/2014 15:21   Mr Cervical Spine W Wo Contrast  02/12/2014   CLINICAL DATA:  Multiple sclerosis.  EXAM: MRI CERVICAL SPINE WITHOUT AND WITH CONTRAST  TECHNIQUE: Multiplanar and multiecho pulse sequences of the cervical spine, to include the craniocervical junction and cervicothoracic junction, were obtained according to standard protocol without and with intravenous contrast.  CONTRAST:   20mL MULTIHANCE GADOBENATE DIMEGLUMINE 529 MG/ML IV SOLN  COMPARISON:  09/05/2013.  FINDINGS: Alignment: Mild levoconvex torticollis.  No spondylolisthesis.  Vertebrae: Normal. Marrow signal in the visualized skull sub appears normal.  Cord: Normal. No intramedullary lesions, atrophy, or edema. No myelomalacia at to suggest prior episodes of demyelinating disease.  Posterior Fossa: Deferred to MRI brain today.  Vertebral Arteries: Flow voids present bilaterally.  Paraspinal tissues: Normal.  Disc levels:  C2-C3:  Negative.  C3-C4:  Minimal bulge.  No stenosis.  C4-C5:  Minimal bulge.  No stenosis.  C5-C6:  Negative.  C6-C7:  Negative.  C7-T1: Negative. Visualized upper thoracic levels also appear normal.  After gadolinium administration, there is no pathologic enhancement.  IMPRESSION: Normal MRI of the cervical spine with and without contrast. No change from prior. No evidence of multiple sclerosis in the cervical spine.   Electronically Signed   By: Andreas Newport M.D.   On: 02/12/2014 15:00   Medications: I have reviewed the patient's current medications. Scheduled Meds: . enoxaparin (LOVENOX) injection  40 mg Subcutaneous Q24H  . Glatiramer Acetate  40 mg Subcutaneous Q M,W,F  . methylPREDNISolone (SOLU-MEDROL) injection  1,000 mg Intravenous Q24H  . nystatin  5 mL Oral QID  . pantoprazole (PROTONIX) IV  40 mg Intravenous Daily  . sodium chloride  3 mL Intravenous Q12H   Continuous  Infusions:  PRN Meds:.sodium chloride, HYDROmorphone (DILAUDID) injection, hydrOXYzine, ondansetron (ZOFRAN) IV, sodium chloride Assessment/Plan:   Multiple sclerosis exacerbation - Neurology on board - Continue IV Solumedrol 1g q24 for 3 days (last does this PM) - PT/OT - CIR consulted for possible admission - Reeval of swallow by SLP given improvment    GERD (gastroesophageal reflux disease) -Protonix   Thrush - Nystatin - HIV screen in process  Anxiety -likely complicating treatment - Start  hydroxyzine 50mg  Q6PRN    Back and Leg pain - Dilaudid 0.5mg  Q2PRN for pain with hold parameters.  Will try to transition to Oral medications especially once anxiety better controlled. - Add flexeril 5mg  TIDPRN  DVT PPx: Lovenox Diet: Dys2 Code: Full Dispo: Disposition is deferred at this time, awaiting improvement of current medical problems.  Anticipated discharge in approximately 1-2 day(s).   The patient does not have a current PCP (No Pcp Per Patient) and does not know [facility] after discharge.  The patient does not know have transportation limitations that hinder transportation to clinic appointments.  .Services Needed at time of discharge: Y = Yes, Blank = No PT:   OT:   RN:   Equipment:   Other:     LOS: 2 days   Carlynn PurlErik Hoffman, DO 02/13/2014, 1:31 PM

## 2014-02-13 NOTE — Consult Note (Signed)
Physical Medicine and Rehabilitation Consult  Reason for Consult: MS exacerbation Referring Physician: Dr. Meredith PelJoines   HPI: Mary [name] is a 38 y.o. female with h/o depression, chronic pancreatitis, MS diagnosed 08/2013 and on copaxone;who was admitted on 02/11/14 with numbness and weakness RLE progressing to RUE weakness and right facial numbness with stuttering speech.  MRI brain stable without new lesions and MRI cervical spine without evidence of MS involvement. She was started on high dose solumedrol for MS exacerbation per neurology input and reported to have improvement in symptoms. . Patient on liquid diet due to dicomfort with solids and prefers medication via IV route at this time. Needing IV dilaudid for pain and dysesthesias.  PT/ST evaluations done yesterday and CIR recommended by rehab team and MD.   Speech eval reveals no significant swallowing abnormalities- pt fearful of swallowing pills or solids other than minced diet, SLP questions anxiety component vs esophageal phase issue  Review of Systems  Eyes: Positive for blurred vision (right with exaceration and heat).  Respiratory: Negative for cough and shortness of breath.   Cardiovascular: Positive for chest pain (pain around chest).  Gastrointestinal: Negative for heartburn, nausea, abdominal pain and constipation.  Genitourinary: Negative for dysuria, urgency and frequency.  Musculoskeletal: Positive for back pain and myalgias.  Neurological: Positive for sensory change, speech change, focal weakness and headaches.  Psychiatric/Behavioral: Negative for depression and substance abuse. The patient is not nervous/anxious.     Past Medical History  Diagnosis Date  . Hernia 03-24-13    ventral hernia remains   . GERD (gastroesophageal reflux disease)   . Pancreatitis 03-24-13    2002, 2'2013, 03-14-13  . Pancreatic cyst 2002 onset  . Anxiety   . Depression     "recent breakup with partner of 15 yrs"  . Adrenal  insufficiency 04/23/2013    ??  . MS (multiple sclerosis)    Past Surgical History  Procedure Laterality Date  . Abdominal surgery  ~ 2007    some sort of pancreatic cyst drainage.   . Cholecystectomy      ~ age 58-laparoscopic  . Adenoidectomy    . 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 Eus N/A 04/14/2013    Procedure: UPPER ENDOSCOPIC ULTRASOUND (EUS) LINEAR;  Surgeon: Rachael Feeaniel P Jacobs, MD;  Location: [facility];  Service: Endoscopy;  Laterality: N/A;   Family History  Problem Relation Age of Onset  . Lupus Neg Hx   . Sarcoidosis Neg Hx   . Pancreatitis Neg Hx   . Ataxia Neg Hx   . Chorea Neg Hx   . Dementia Neg Hx   . Mental retardation Neg Hx   . Migraines Neg Hx   . Multiple sclerosis Neg Hx   . Neurofibromatosis Neg Hx   . Neuropathy Neg Hx   . Parkinsonism Neg Hx   . Seizures Neg Hx   . Stroke Neg Hx   . Colitis Maternal Aunt   . Diabetes Mother   . Diabetes Father   . Diabetes      many family members   Social History:  Lives with mother. Has been unemployed since last July (return[place] from [place] She  reports that she quit smoking about 8 months ago. Her smoking use included Cigarettes. She has a 10 pack-year smoking history. She has never used smokeless tobacco. She reports that she does not drink alcohol or use illicit drugs.  Allergies  Allergen Reactions  . Amoxicillin Anaphylaxis  . Penicillins Anaphylaxis  . Latex Itching  . Morphine And Related Nausea And Vomiting    Medications Prior to Admission  Medication Sig Dispense Refill  . b complex vitamins tablet Take 1 tablet by mouth daily.      . cholecalciferol (VITAMIN D) 1000 UNITS tablet Take 5,000 Units by mouth daily.      Marland Kitchen Glatiramer Acetate (COPAXONE) 40 MG/ML SOSY Inject 1 Syringe into the skin 3 (three) times a week.  12 Syringe  0  . lipase/protease/amylase (CREON-12/PANCREASE) 12000 UNITS CPEP capsule Take 2 capsules by mouth 3  (three) times daily with meals.  180 capsule  2  . oxyCODONE (OXY IR/ROXICODONE) 5 MG immediate release tablet Take 5 mg by mouth every 4 (four) hours as needed for severe pain.      . pantoprazole (PROTONIX) 40 MG tablet Take 40 mg by mouth daily.      . promethazine (PHENERGAN) 25 MG tablet Take 1 tablet (25 mg total) by mouth every 6 (six) hours as needed for nausea or vomiting.  15 tablet  0    Home: Home Living Family/patient expects to be discharged to:: Private residence Living Arrangements: Other relatives (Mother in-law) Available Help at Discharge: Family;Available 24 hours/day Type of Home: House Home Access: Ramped entrance Home Layout: One level Home Equipment: Cane - single point;Grab bars - tub/shower (tall toilets)  Functional History: Prior Function Level of Independence: Independent with assistive device(s) Comments: Intermittent use of cane during previous exacerbation Functional Status:  Mobility: Bed Mobility Overal bed mobility: Needs Assistance Bed Mobility: Supine to Sit Supine to sit: Min guard;HOB elevated General bed mobility comments: Min guard for safety with HOB elevated. VCs for technique Transfers Overall transfer level: Needs assistance Equipment used: Rolling walker (2 wheeled) Transfers: Sit to/from Stand Sit to Stand: Min guard General transfer comment: Close contact guard for safety. Educated on hand placement. Performed from lowest bed setting and BSC. Ambulation/Gait Ambulation/Gait assistance: Min assist Ambulation Distance (Feet): 25 Feet (additional bout of 15 feet.) Assistive device: Rolling walker (2 wheeled) Gait Pattern/deviations: Step-to pattern;Decreased step length - right;Decreased stance time - right;Decreased stride length;Decreased dorsiflexion - right;Decreased weight shift to right Gait velocity interpretation: Below normal speed for age/gender General Gait Details: Pt essentially dragging Rt foot, no dorsiflexion noted.  Some ability to flex hip forward. Min assist to advance RLE for correct placement. Pt bears some weight through RUE but reports difficulty due to weakness. Educated on safe DME use.    ADL:    Cognition: Cognition Overall Cognitive Status: Within Functional Limits for tasks assessed Orientation Level: Oriented X4 Cognition Arousal/Alertness: Awake/alert Behavior During Therapy: WFL for tasks assessed/performed Overall Cognitive Status: Within Functional Limits for tasks assessed  Blood pressure 116/71, pulse 55, temperature 97.6 F (36.4 C), temperature source Oral, resp. rate 18, height 5\' 4"  (1.626 m), weight 106.686 kg (235 lb 3.2 oz), last menstrual period 01/27/2014, SpO2 92.00%. Physical Exam  Nursing note and vitals reviewed. Constitutional: She is oriented to person, place, and time. She appears well-developed and well-nourished.  HENT:  Head: Normocephalic and atraumatic.  Eyes: Conjunctivae are normal. Pupils are equal, round, and reactive to light.  Mild edema bilateral eyelids.   Neck: Normal range of motion. Neck supple.  Cardiovascular: Normal rate and regular rhythm.   Murmur heard. Respiratory: Effort normal. No respiratory distress. She has no wheezes.  GI: Soft. Bowel sounds are normal. She exhibits no distension. There is no  tenderness.  Musculoskeletal: She exhibits no edema.  Neurological: She is alert and oriented to person, place, and time.  Occasional hesitation noted but normal speech rate. Able to follow commands without difficulty. Right hemibody dysesthesias reported. Right sided weakness--poor effort.   Skin: Skin is warm and dry.  Psoriatic lesion right knee  RUE 3-/5 RLE 2- HF, KE, trace ADF/PF 5/5 on Left side Sensory mildly diminished RLE  Results for orders placed during the hospital encounter of 02/11/14 (from the past 24 hour(s))  BASIC METABOLIC PANEL     Status: Abnormal   Collection Time    02/13/14  5:31 AM      Result Value Ref Range    Sodium 134 (*) 137 - 147 mEq/L   Potassium 6.3 (*) 3.7 - 5.3 mEq/L   Chloride 101  96 - 112 mEq/L   CO2 18 (*) 19 - 32 mEq/L   Glucose, Bld 173 (*) 70 - 99 mg/dL   BUN 14  6 - 23 mg/dL   Creatinine, Ser 2.94 (*) 0.50 - 1.10 mg/dL   Calcium 8.7  8.4 - 76.5 mg/dL   GFR calc non Af Amer >90  >90 mL/min   GFR calc Af Amer >90  >90 mL/min   Ct Head Wo Contrast  02/11/2014   CLINICAL DATA:  Right-sided weakness.  Multiple sclerosis.  EXAM: CT HEAD WITHOUT CONTRAST  TECHNIQUE: Contiguous axial images were obtained from the base of the skull through the vertex without intravenous contrast.  COMPARISON:  Head MRI 09/05/2013 and CT 04/22/2013  FINDINGS: Ventricles and sulci are within normal limits for age. There is no evidence of acute cortical infarct, intracranial hemorrhage, mass, midline shift, or extra-axial fluid collection. Extensive, patchy hypodensities throughout the cerebral white matter bilaterally are similar to the prior CT. Orbits are unremarkable. Mastoid air cells and visualized paranasal sinuses are clear.  IMPRESSION: 1. No evidence of acute intracranial abnormality. 2. Unchanged cerebral white matter disease.   Electronically Signed   By: Sebastian Ache   On: 02/11/2014 18:43   Mr Brain W Wo Contrast  02/12/2014   CLINICAL DATA:  38 year old female with newly diagnosed relapsing remitting multiple sclerosis. Right side numbness tingling pain in weakness x1 day. Initial encounter. History of chronic pancreatitis.  EXAM: MRI HEAD WITHOUT AND WITH CONTRAST  TECHNIQUE: Multiplanar, multiecho pulse sequences of the brain and surrounding structures were obtained without and with intravenous contrast.  CONTRAST:  20mL MULTIHANCE GADOBENATE DIMEGLUMINE 529 MG/ML IV SOLN in conjunction with contrast enhanced imaging of the cervical spine reported separately.  COMPARISON:  Brain MRI 09/05/2013, 04/22/2013.  FINDINGS: Stable cerebral volume. Widespread abnormal cerebral white matter T2 and FLAIR  hyperintensity re- identified. Confluent involvement of the pons again noted. Mild involvement in the right cerebellar white matter again noted. Comparing sagittal FLAIR imaging, the extent of disease is stable since 04/22/2013. No lesions with restricted diffusion. No enhancing lesions.  Underlying normal cerebral volume. No restricted diffusion to suggest acute infarction. No midline shift, mass effect, evidence of mass lesion, ventriculomegaly, extra-axial collection or acute intracranial hemorrhage. Cervicomedullary junction and pituitary are within normal limits. Major intracranial vascular flow voids are stable.  Cervical spine reported separately.  No new signal abnormality identified. Deep gray matter nuclei and medulla remain spared. No cortical encephalomalacia.  Visible internal auditory structures appear normal. Visualized paranasal sinuses and mastoids are clear. Grossly normal orbits soft tissues. Normal bone marrow signal. Visualized scalp soft tissues are within normal limits.  IMPRESSION: 1. Abnormal cerebral  white matter, cerebellar white matter, and pontine signal abnormality compatible with multiple sclerosis is stable since 04/22/2013. No new lesions and no evidence of acute demyelination identified. 2. No new intracranial abnormality. 3. Cervical spine MRI today reported separately.   Electronically Signed   By: Augusto Gamble M.D.   On: 02/12/2014 15:21   Mr Cervical Spine W Wo Contrast  02/12/2014   CLINICAL DATA:  Multiple sclerosis.  EXAM: MRI CERVICAL SPINE WITHOUT AND WITH CONTRAST  TECHNIQUE: Multiplanar and multiecho pulse sequences of the cervical spine, to include the craniocervical junction and cervicothoracic junction, were obtained according to standard protocol without and with intravenous contrast.  CONTRAST:  20mL MULTIHANCE GADOBENATE DIMEGLUMINE 529 MG/ML IV SOLN  COMPARISON:  09/05/2013.  FINDINGS: Alignment: Mild levoconvex torticollis.  No spondylolisthesis.  Vertebrae:  Normal. Marrow signal in the visualized skull sub appears normal.  Cord: Normal. No intramedullary lesions, atrophy, or edema. No myelomalacia at to suggest prior episodes of demyelinating disease.  Posterior Fossa: Deferred to MRI brain today.  Vertebral Arteries: Flow voids present bilaterally.  Paraspinal tissues: Normal.  Disc levels:  C2-C3:  Negative.  C3-C4:  Minimal bulge.  No stenosis.  C4-C5:  Minimal bulge.  No stenosis.  C5-C6:  Negative.  C6-C7:  Negative.  C7-T1: Negative. Visualized upper thoracic levels also appear normal.  After gadolinium administration, there is no pathologic enhancement.  IMPRESSION: Normal MRI of the cervical spine with and without contrast. No change from prior. No evidence of multiple sclerosis in the cervical spine.   Electronically Signed   By: Andreas Newport M.D.   On: 02/12/2014 15:00    Assessment/Plan: Diagnosis: MS exacerbation but no new cervical or intracranial lesions, mainly with R HP and neurogenic stuttering 1. Does the need for close, 24 hr/day medical supervision in concert with the patient's rehab needs make it unreasonable for this patient to be served in a less intensive setting? Yes 2. Co-Morbidities requiring supervision/potential complications: morbid obesity, anxiety, GERD 3. Due to bladder management, safety, education, pain management, does the patient require 24 hr/day rehab nursing? Yes 4. Does the patient require coordinated care of a physician, rehab nurse, PT (1-2 hrs/day, 5 days/week), OT (1-2 hrs/day, 5 days/week) and SLP (.5-1 hrs/day, 5 days/week) to address physical and functional deficits in the context of the above medical diagnosis(es)? Yes Addressing deficits in the following areas: balance, endurance, locomotion, strength, transferring, bathing, dressing, feeding, grooming, toileting, cognition, speech, swallowing and psychosocial support 5. Can the patient actively participate in an intensive therapy program of at least 3 hrs  of therapy per day at least 5 days per week? Yes 6. The potential for patient to make measurable gains while on inpatient rehab is excellent 7. Anticipated functional outcomes upon discharge from inpatient rehab are modified independent  with PT, modified independent with OT, modified independent with SLP. 8. Estimated rehab length of stay to reach the above functional goals is: 7-10days 9. Does the patient have adequate social supports to accommodate these discharge functional goals? Potentially 10. Anticipated D/C setting: Home 11. Anticipated post D/C treatments: HH therapy 12. Overall Rehab/Functional Prognosis: good  RECOMMENDATIONS: This patient's condition is appropriate for continued rehabilitative care in the following setting: CIR Patient has agreed to participate in recommended program. Yes Note that insurance prior authorization may be required for reimbursement for recommended care.  Comment: rec gabapentin for dysesthetic pain rather than IV narcotics, switch to po meds given no pharyngeal dysphagia    02/13/2014

## 2014-02-14 ENCOUNTER — Inpatient Hospital Stay (HOSPITAL_COMMUNITY)
Admission: RE | Admit: 2014-02-14 | Discharge: 2014-02-23 | DRG: 945 | Disposition: A | Discharge status: Home / Self Care | Payer: Medicaid Other | Source: Intra-hospital | Attending: Physical Medicine & Rehabilitation | Admitting: Physical Medicine & Rehabilitation

## 2014-02-14 DIAGNOSIS — R1310 Dysphagia, unspecified: Secondary | ICD-10-CM | POA: Diagnosis present

## 2014-02-14 DIAGNOSIS — F4323 Adjustment disorder with mixed anxiety and depressed mood: Secondary | ICD-10-CM | POA: Diagnosis present

## 2014-02-14 DIAGNOSIS — J029 Acute pharyngitis, unspecified: Secondary | ICD-10-CM | POA: Diagnosis present

## 2014-02-14 DIAGNOSIS — Z79899 Other long term (current) drug therapy: Secondary | ICD-10-CM

## 2014-02-14 DIAGNOSIS — K861 Other chronic pancreatitis: Secondary | ICD-10-CM | POA: Diagnosis present

## 2014-02-14 DIAGNOSIS — M545 Low back pain, unspecified: Secondary | ICD-10-CM

## 2014-02-14 DIAGNOSIS — K862 Cyst of pancreas: Secondary | ICD-10-CM

## 2014-02-14 DIAGNOSIS — F411 Generalized anxiety disorder: Secondary | ICD-10-CM | POA: Diagnosis present

## 2014-02-14 DIAGNOSIS — Z5189 Encounter for other specified aftercare: Principal | ICD-10-CM

## 2014-02-14 DIAGNOSIS — Z880 Allergy status to penicillin: Secondary | ICD-10-CM

## 2014-02-14 DIAGNOSIS — K219 Gastro-esophageal reflux disease without esophagitis: Secondary | ICD-10-CM | POA: Diagnosis present

## 2014-02-14 DIAGNOSIS — G35 Multiple sclerosis: Secondary | ICD-10-CM | POA: Diagnosis present

## 2014-02-14 DIAGNOSIS — Z91040 Latex allergy status: Secondary | ICD-10-CM | POA: Diagnosis not present

## 2014-02-14 DIAGNOSIS — K5900 Constipation, unspecified: Secondary | ICD-10-CM | POA: Diagnosis present

## 2014-02-14 DIAGNOSIS — Z87891 Personal history of nicotine dependence: Secondary | ICD-10-CM | POA: Diagnosis not present

## 2014-02-14 DIAGNOSIS — F329 Major depressive disorder, single episode, unspecified: Secondary | ICD-10-CM | POA: Diagnosis present

## 2014-02-14 DIAGNOSIS — F3289 Other specified depressive episodes: Secondary | ICD-10-CM | POA: Diagnosis present

## 2014-02-14 MED ORDER — HYDROXYZINE HCL 50 MG PO TABS: 50.0000 mg | ORAL_TABLET | Freq: Four times a day (QID) | ORAL | Status: DC | PRN

## 2014-02-14 MED ORDER — GABAPENTIN 100 MG PO CAPS
100.0000 mg | ORAL_CAPSULE | Freq: Three times a day (TID) | ORAL | Status: DC
  Filled 2014-02-14: qty 1

## 2014-02-14 MED ORDER — ALUM & MAG HYDROXIDE-SIMETH 200-200-20 MG/5ML PO SUSP: 30.0000 mL | ORAL | Status: DC | PRN

## 2014-02-14 MED ORDER — ONDANSETRON 4 MG PO TBDP: 4.0000 mg | ORAL_TABLET | Freq: Three times a day (TID) | ORAL | Status: DC | PRN

## 2014-02-14 MED ORDER — PANTOPRAZOLE SODIUM 40 MG PO PACK
40.0000 mg | PACK | Freq: Every day | ORAL | Status: DC
  Administered 2014-02-15 – 2014-02-23 (×9): 40 mg via ORAL
  Filled 2014-02-14 (×10): qty 20

## 2014-02-14 MED ORDER — NYSTATIN 100000 UNIT/ML MT SUSP
5.0000 mL | Freq: Four times a day (QID) | OROMUCOSAL | Status: DC
  Administered 2014-02-14 – 2014-02-23 (×34): 500000 [IU] via ORAL
  Filled 2014-02-14 (×39): qty 5

## 2014-02-14 MED ORDER — CYCLOBENZAPRINE HCL 10 MG PO TABS
5.0000 mg | ORAL_TABLET | Freq: Three times a day (TID) | ORAL | Status: DC | PRN
  Administered 2014-02-15 – 2014-02-23 (×14): 5 mg via ORAL
  Filled 2014-02-14 (×15): qty 1

## 2014-02-14 MED ORDER — ENOXAPARIN SODIUM 40 MG/0.4ML ~~LOC~~ SOLN
40.0000 mg | SUBCUTANEOUS | Status: DC
  Filled 2014-02-14 (×5): qty 0.4

## 2014-02-14 MED ORDER — TRAZODONE HCL 50 MG PO TABS
25.0000 mg | ORAL_TABLET | Freq: Every evening | ORAL | Status: DC | PRN
  Administered 2014-02-14 – 2014-02-22 (×4): 50 mg via ORAL
  Administered 2014-02-22: 25 mg via ORAL
  Filled 2014-02-14 (×5): qty 1

## 2014-02-14 MED ORDER — ACETAMINOPHEN 325 MG PO TABS: 325.0000 mg | ORAL_TABLET | ORAL | Status: DC | PRN

## 2014-02-14 MED ORDER — PROCHLORPERAZINE 25 MG RE SUPP
12.5000 mg | Freq: Four times a day (QID) | RECTAL | Status: DC | PRN
  Administered 2014-02-17: 12.5 mg via RECTAL
  Filled 2014-02-14: qty 1

## 2014-02-14 MED ORDER — GUAIFENESIN-DM 100-10 MG/5ML PO SYRP: 5.0000 mL | ORAL_SOLUTION | Freq: Four times a day (QID) | ORAL | Status: DC | PRN

## 2014-02-14 MED ORDER — HYDROXYZINE HCL 50 MG PO TABS
50.0000 mg | ORAL_TABLET | Freq: Four times a day (QID) | ORAL | Status: DC | PRN
  Administered 2014-02-14 – 2014-02-23 (×18): 50 mg via ORAL
  Filled 2014-02-14 (×19): qty 1

## 2014-02-14 MED ORDER — CYCLOBENZAPRINE HCL 5 MG PO TABS: 5.0000 mg | ORAL_TABLET | Freq: Three times a day (TID) | ORAL | Status: DC | PRN

## 2014-02-14 MED ORDER — BIOTENE DRY MOUTH MT LIQD
15.0000 mL | Freq: Two times a day (BID) | OROMUCOSAL | Status: DC
  Administered 2014-02-14 – 2014-02-23 (×17): 15 mL via OROMUCOSAL

## 2014-02-14 MED ORDER — OXYCODONE HCL 5 MG/5ML PO SOLN: 5.0000 mg | ORAL | Status: DC | PRN

## 2014-02-14 MED ORDER — POLYETHYLENE GLYCOL 3350 17 G PO PACK
17.0000 g | PACK | Freq: Every day | ORAL | Status: DC | PRN
  Filled 2014-02-14: qty 1

## 2014-02-14 MED ORDER — PANTOPRAZOLE SODIUM 40 MG PO TBEC: 40.0000 mg | DELAYED_RELEASE_TABLET | Freq: Every day | ORAL | Status: DC

## 2014-02-14 MED ORDER — PROCHLORPERAZINE EDISYLATE 5 MG/ML IJ SOLN
5.0000 mg | Freq: Four times a day (QID) | INTRAMUSCULAR | Status: DC | PRN
  Filled 2014-02-14: qty 2

## 2014-02-14 MED ORDER — OXYCODONE HCL 5 MG/5ML PO SOLN
5.0000 mg | ORAL | Status: DC | PRN
  Administered 2014-02-14: 5 mg via ORAL
  Filled 2014-02-14: qty 5

## 2014-02-14 MED ORDER — GLATIRAMER ACETATE 40 MG/ML ~~LOC~~ SOSY
40.0000 mg | PREFILLED_SYRINGE | SUBCUTANEOUS | Status: DC
  Administered 2014-02-16: 40 mg via SUBCUTANEOUS

## 2014-02-14 MED ORDER — TRAMADOL HCL 50 MG PO TABS
50.0000 mg | ORAL_TABLET | Freq: Three times a day (TID) | ORAL | Status: DC
  Administered 2014-02-14 – 2014-02-23 (×34): 50 mg via ORAL
  Filled 2014-02-14 (×35): qty 1

## 2014-02-14 MED ORDER — GABAPENTIN 100 MG PO CAPS: 100.0000 mg | ORAL_CAPSULE | Freq: Three times a day (TID) | ORAL | Status: DC

## 2014-02-14 MED ORDER — DIPHENHYDRAMINE HCL 12.5 MG/5ML PO ELIX: 12.5000 mg | ORAL_SOLUTION | Freq: Four times a day (QID) | ORAL | Status: DC | PRN

## 2014-02-14 MED ORDER — FLEET ENEMA 7-19 GM/118ML RE ENEM: 1.0000 | ENEMA | Freq: Once | RECTAL | Status: AC | PRN

## 2014-02-14 MED ORDER — OXYCODONE HCL 5 MG PO TABS
5.0000 mg | ORAL_TABLET | ORAL | Status: DC | PRN
  Administered 2014-02-14 – 2014-02-15 (×4): 5 mg via ORAL
  Filled 2014-02-14 (×5): qty 1

## 2014-02-14 MED ORDER — PROCHLORPERAZINE MALEATE 5 MG PO TABS
5.0000 mg | ORAL_TABLET | Freq: Four times a day (QID) | ORAL | Status: DC | PRN
  Administered 2014-02-18 – 2014-02-20 (×2): 10 mg via ORAL
  Filled 2014-02-14 (×3): qty 2

## 2014-02-14 MED ORDER — BISACODYL 10 MG RE SUPP
10.0000 mg | Freq: Every day | RECTAL | Status: DC | PRN
  Administered 2014-02-16 – 2014-02-18 (×2): 10 mg via RECTAL
  Filled 2014-02-14 (×2): qty 1

## 2014-02-14 NOTE — Progress Notes (Signed)
Physical Therapy Treatment Patient Details Name: Mary MeHaley M [name] MRN: [id number] DOB: [birth year] Today's Date: 02/14/2014    History of Present Illness Mary [name] is a 38 year old pleasant woman with past medical history of newly diagnosed relapsing-remitting MS, chronic pancreatitis, anxiety, depression, and GERD who presents with right sided numbness/tingling, pain, and weakness.  She is being treated for pain and MS flare.  Pt also reports hernia that is painful (for gait belt purposes).     PT Comments    Patient mobilizing with RW but limited by pain and fatigue. Patient would benefit from CIR.  Follow Up Recommendations  CIR     Equipment Recommendations  Rolling walker with 5" wheels       Precautions / Restrictions Precautions Precautions: Fall Restrictions Weight Bearing Restrictions: No    Mobility  Bed Mobility Overal bed mobility: Modified Independent Bed Mobility: Supine to Sit     Supine to sit: Min guard;HOB elevated     General bed mobility comments: Mod I due to slow speed of movement  Transfers Overall transfer level: Needs assistance Equipment used: Rolling walker (2 wheeled) Transfers: Sit to/from Stand Sit to Stand: Min guard         General transfer comment: Min guard for safety. Patient slow to stand but able to stand without assistance from PT.  Ambulation/Gait Ambulation/Gait assistance: Min assist (To assist right foot when stuck during gait) Ambulation Distance (Feet): 30 Feet Assistive device: Rolling walker (2 wheeled) Gait Pattern/deviations: Step-to pattern;Decreased dorsiflexion - right (Right foot drag) Gait velocity: decreased   General Gait Details: Follow patient with chair to decrease patient anxiety         Balance Overall balance assessment: Needs assistance Sitting-balance support: Single extremity supported;Bilateral upper extremity supported;Feet supported;Feet unsupported Sitting balance-Leahy Scale:  Good Sitting balance - Comments: Patient able to balance without UE support. Patient able to scoot to EOB without assistance from PT.   Standing balance support: Single extremity supported (Patient able to stand and take one hand off walker to toilet) Standing balance-Leahy Scale: Poor Standing balance comment: Needs support of at least one extremity when toileting                    Cognition Arousal/Alertness: Awake/alert Behavior During Therapy: Anxious (very analytical ) Overall Cognitive Status: Within Functional Limits for tasks assessed                          Pertinent Vitals/Pain See vitals flow sheet.            PT Goals (current goals can now be found in the care plan section) Acute Rehab PT Goals PT Goal Formulation: With patient Time For Goal Achievement: 02/19/14 Potential to Achieve Goals: Good Progress towards PT goals: Progressing toward goals    Frequency  Min 4X/week    PT Plan Current plan remains appropriate       End of Session Equipment Utilized During Treatment: Gait belt Activity Tolerance: Patient limited by fatigue;Patient limited by pain Patient left: in chair;with call bell/phone within reach     Time: 1445-1509 PT Time Calculation (min): 24 min  Charges:  $Gait Training: 8-22 mins $Therapeutic Activity: 8-22 mins                    [place]:      Mary [name], [place] [phone]

## 2014-02-14 NOTE — Progress Notes (Signed)
Subjective: Patient continues to complain of right leg weakness. She feels she is making improvement however and really wants to go to CIR.   Objective: Current vital signs: BP 107/62  Pulse 62  Temp(Src) 97.9 F (36.6 C) (Oral)  Resp 18  Ht 5\' 4"  (1.626 m)  Wt 106.686 kg (235 lb 3.2 oz)  BMI 40.35 kg/m2  SpO2 95%  LMP 01/27/2014 Vital signs in last 24 hours: Temp:  [97.2 F (36.2 C)-98.2 F (36.8 C)] 97.9 F (36.6 C) (06/30 0550) Pulse Rate:  [62-68] 62 (06/30 0550) Resp:  [18] 18 (06/30 0550) BP: (95-113)/(61-71) 107/62 mmHg (06/30 0550) SpO2:  [95 %-98 %] 95 % (06/30 0550)  Intake/Output from previous day:   Intake/Output this shift:   Nutritional status: Dysphagia  Neurologic Exam: Mental Status:  Alert, oriented, thought content appropriate. Speech fluent without evidence of aphasia. Able to follow 3 step commands without difficulty.  Cranial Nerves:  II: Visual fields grossly normal, pupils equal, round, reactive to light and accommodation  III,IV, VI: ptosis present left eye, extra-ocular motions intact bilaterally  V,VII: smile symmetric, facial light touch sensation normal bilaterally  VIII: hearing normal bilaterally  IX,X: gag reflex present  XI: bilateral shoulder shrug  XII: midline tongue extension without atrophy or fasciculations  Motor:  Moves bilateral UE with 5/5 on the left and 4/5 with significant give way strength on the right. Refuses to move her right leg but when distracted will move leg side to side and flex at knee on the bed. Moves left LE 5/5. Positive Hoovers. Able to walk with walker placing minimal UE weight on walker and able to support her weight with bilateral legs.  Sensory: Pinprick and light touch decreased on the right face, arm trunk and leg, split midline throughout body.  Deep Tendon Reflexes:  2/4 throughout  Plantars:  Right: downgoing  Left: downgoing      Lab Results: Basic Metabolic Panel:  Recent Labs Lab  02/11/14 1830 02/12/14 0705 02/13/14 0531 02/13/14 0749  NA 141 136* 134* 137  K 3.9 4.5 6.3* 4.5  CL 103 100 101 99  CO2 22 20 18* 24  GLUCOSE 98 170* 173* 168*  BUN 8 10 14 14   CREATININE 0.48* 0.48* 0.43* 0.46*  CALCIUM 9.1 8.7 8.7 9.0    Liver Function Tests:  Recent Labs Lab 02/12/14 0705  AST 28  ALT 33  ALKPHOS 92  BILITOT <0.2*  PROT 8.0  ALBUMIN 3.4*   No results found for this basename: LIPASE, AMYLASE,  in the last 168 hours No results found for this basename: AMMONIA,  in the last 168 hours  CBC:  Recent Labs Lab 02/11/14 1830 02/12/14 0400  WBC 9.3 6.8  NEUTROABS 6.9  --   HGB 12.0 11.5*  HCT 37.2 35.1*  MCV 86.7 86.0  PLT 270 275    Cardiac Enzymes: No results found for this basename: CKTOTAL, CKMB, CKMBINDEX, TROPONINI,  in the last 168 hours  Lipid Panel: No results found for this basename: CHOL, TRIG, HDL, CHOLHDL, VLDL, LDLCALC,  in the last 168 hours  CBG: No results found for this basename: GLUCAP,  in the last 168 hours  Microbiology: Results for orders placed during the hospital encounter of 04/14/13  SURGICAL PCR SCREEN     Status: None   Collection Time    04/15/13 12:56 AM      Result Value Ref Range Status   MRSA, PCR NEGATIVE  NEGATIVE Final   Staphylococcus aureus NEGATIVE  NEGATIVE Final   Comment:            The Xpert SA Assay (FDA     approved for NASAL specimens     in patients over 37 years of age),     is one component of     a comprehensive surveillance     program.  Test performance has     been validated by [facility] for patients greater     than or equal to 723 year old.     It is not intended     to diagnose infection nor to     guide or monitor treatment.  GRAM STAIN     Status: None   Collection Time    04/25/13 11:20 AM      Result Value Ref Range Status   Specimen Description CSF   Final   Special Requests NONE   Final   Gram Stain     Final   Value: NO ORGANISMS SEEN     NO WBC SEEN      Gram Stain Report Called to,Read Back By and Verified With: BLOCK,D. AT 1321 ON 09.08.14 BY LOVE,T.   Report Status 04/25/2013 FINAL   Final    Coagulation Studies: No results found for this basename: LABPROT, INR,  in the last 72 hours  Imaging: Mr Laqueta JeanBrain W Wo Contrast  02/12/2014   CLINICAL DATA:  38 year old female with newly diagnosed relapsing remitting multiple sclerosis. Right side numbness tingling pain in weakness x1 day. Initial encounter. History of chronic pancreatitis.  EXAM: MRI HEAD WITHOUT AND WITH CONTRAST  TECHNIQUE: Multiplanar, multiecho pulse sequences of the brain and surrounding structures were obtained without and with intravenous contrast.  CONTRAST:  20mL MULTIHANCE GADOBENATE DIMEGLUMINE 529 MG/ML IV SOLN in conjunction with contrast enhanced imaging of the cervical spine reported separately.  COMPARISON:  Brain MRI 09/05/2013, 04/22/2013.  FINDINGS: Stable cerebral volume. Widespread abnormal cerebral white matter T2 and FLAIR hyperintensity re- identified. Confluent involvement of the pons again noted. Mild involvement in the right cerebellar white matter again noted. Comparing sagittal FLAIR imaging, the extent of disease is stable since 04/22/2013. No lesions with restricted diffusion. No enhancing lesions.  Underlying normal cerebral volume. No restricted diffusion to suggest acute infarction. No midline shift, mass effect, evidence of mass lesion, ventriculomegaly, extra-axial collection or acute intracranial hemorrhage. Cervicomedullary junction and pituitary are within normal limits. Major intracranial vascular flow voids are stable.  Cervical spine reported separately.  No new signal abnormality identified. Deep gray matter nuclei and medulla remain spared. No cortical encephalomalacia.  Visible internal auditory structures appear normal. Visualized paranasal sinuses and mastoids are clear. Grossly normal orbits soft tissues. Normal bone marrow signal. Visualized scalp  soft tissues are within normal limits.  IMPRESSION: 1. Abnormal cerebral white matter, cerebellar white matter, and pontine signal abnormality compatible with multiple sclerosis is stable since 04/22/2013. No new lesions and no evidence of acute demyelination identified. 2. No new intracranial abnormality. 3. Cervical spine MRI today reported separately.   Electronically Signed   By: Augusto GambleLee  Hall M.D.   On: 02/12/2014 15:21   Mr Cervical Spine W Wo Contrast  02/12/2014   CLINICAL DATA:  Multiple sclerosis.  EXAM: MRI CERVICAL SPINE WITHOUT AND WITH CONTRAST  TECHNIQUE: Multiplanar and multiecho pulse sequences of the cervical spine, to include the craniocervical junction and cervicothoracic junction, were obtained according to standard protocol without and with intravenous contrast.  CONTRAST:  20mL MULTIHANCE GADOBENATE  DIMEGLUMINE 529 MG/ML IV SOLN  COMPARISON:  09/05/2013.  FINDINGS: Alignment: Mild levoconvex torticollis.  No spondylolisthesis.  Vertebrae: Normal. Marrow signal in the visualized skull sub appears normal.  Cord: Normal. No intramedullary lesions, atrophy, or edema. No myelomalacia at to suggest prior episodes of demyelinating disease.  Posterior Fossa: Deferred to MRI brain today.  Vertebral Arteries: Flow voids present bilaterally.  Paraspinal tissues: Normal.  Disc levels:  C2-C3:  Negative.  C3-C4:  Minimal bulge.  No stenosis.  C4-C5:  Minimal bulge.  No stenosis.  C5-C6:  Negative.  C6-C7:  Negative.  C7-T1: Negative. Visualized upper thoracic levels also appear normal.  After gadolinium administration, there is no pathologic enhancement.  IMPRESSION: Normal MRI of the cervical spine with and without contrast. No change from prior. No evidence of multiple sclerosis in the cervical spine.   Electronically Signed   By: Andreas Newport M.D.   On: 02/12/2014 15:00    Medications:  Scheduled: . enoxaparin (LOVENOX) injection  40 mg Subcutaneous Q24H  . Glatiramer Acetate  40 mg Subcutaneous  Q M,W,F  . methylPREDNISolone (SOLU-MEDROL) injection  1,000 mg Intravenous Q24H  . nystatin  5 mL Oral QID  . pantoprazole (PROTONIX) IV  40 mg Intravenous Daily  . sodium chloride  3 mL Intravenous Q12H    Assessment/Plan:  Multiple sclerosis exacerbation finished high-dose steroid treatment for 3 days as well as physical therapy intervention. CIR evaluating patient. Will likely need SNF on discharge if not CIR candidate. No further recommendations. Neurology S/O   Felicie Morn PA-C [facility] [phone]  02/14/2014, 11:27 AM

## 2014-02-14 NOTE — Progress Notes (Signed)
Subjective: Patient notes her pain has been well controlled with dilaudid 0.5 mg q2prn, is willing to try oral medications.  She notes her anxiety has improved with addition of hydroxyzine.  She feels like she has made gradual improvement. 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  Speech is clear.    Lab Results: Basic Metabolic Panel:  Recent Labs Lab 02/13/14 0531 02/13/14 0749  NA 134* 137  K 6.3* 4.5  CL 101 99  CO2 18* 24  GLUCOSE 173* 168*  BUN 14 14  CREATININE 0.43* 0.46*  CALCIUM 8.7 9.0   Liver Function Tests:  Recent Labs Lab 02/12/14 0705  AST 28  ALT 33  ALKPHOS 92  BILITOT <0.2*  PROT 8.0  ALBUMIN 3.4*   CBC:  Recent Labs Lab 02/11/14 1830 02/12/14 0400  WBC 9.3 6.8  NEUTROABS 6.9  --   HGB 12.0 11.5*  HCT 37.2 35.1*  MCV 86.7 86.0  PLT 270 275   Urine Drug Screen: Drugs of Abuse     Component Value Date/Time   LABOPIA NONE DETECTED 02/11/2014 1801   COCAINSCRNUR NONE DETECTED 02/11/2014 1801   LABBENZ NONE DETECTED 02/11/2014 1801   AMPHETMU NONE DETECTED 02/11/2014 1801   THCU NONE DETECTED 02/11/2014 1801   LABBARB  NONE DETECTED 02/11/2014 1801    Urinalysis:  Recent Labs Lab 02/11/14 1801  COLORURINE YELLOW  LABSPEC 1.023  PHURINE 6.0  GLUCOSEU NEGATIVE  HGBUR NEGATIVE  BILIRUBINUR NEGATIVE  KETONESUR NEGATIVE  PROTEINUR NEGATIVE  UROBILINOGEN 0.2  NITRITE NEGATIVE  LEUKOCYTESUR NEGATIVE   Micro Results: No results found for this or any previous visit (from the past 240 hour(s)). Studies/Results: Mr Lodema Pilot Contrast  02/12/2014   CLINICAL DATA:  38 year old female with newly diagnosed relapsing remitting multiple sclerosis. Right side numbness tingling pain in weakness x1 day. Initial encounter. History of chronic pancreatitis.  EXAM: MRI HEAD WITHOUT AND WITH CONTRAST  TECHNIQUE: Multiplanar, multiecho pulse sequences of the brain and surrounding structures were obtained without and with intravenous contrast.  CONTRAST:  61mL MULTIHANCE GADOBENATE DIMEGLUMINE 529 MG/ML IV SOLN in conjunction with contrast enhanced imaging of the cervical spine reported separately.  COMPARISON:  Brain MRI 09/05/2013, 04/22/2013.  FINDINGS: Stable cerebral volume. Widespread abnormal cerebral white matter T2 and FLAIR hyperintensity re- identified. Confluent involvement of the pons again noted. Mild involvement in the right cerebellar white matter again noted. Comparing sagittal FLAIR imaging, the extent of disease is stable since 04/22/2013. No lesions with restricted diffusion. No enhancing lesions.  Underlying normal cerebral volume. No restricted diffusion to suggest  acute infarction. No midline shift, mass effect, evidence of mass lesion, ventriculomegaly, extra-axial collection or acute intracranial hemorrhage. Cervicomedullary junction and pituitary are within normal limits. Major intracranial vascular flow voids are stable.  Cervical spine reported separately.  No new signal abnormality identified. Deep gray matter nuclei and medulla remain spared. No cortical encephalomalacia.  Visible internal auditory  structures appear normal. Visualized paranasal sinuses and mastoids are clear. Grossly normal orbits soft tissues. Normal bone marrow signal. Visualized scalp soft tissues are within normal limits.  IMPRESSION: 1. Abnormal cerebral white matter, cerebellar white matter, and pontine signal abnormality compatible with multiple sclerosis is stable since 04/22/2013. No new lesions and no evidence of acute demyelination identified. 2. No new intracranial abnormality. 3. Cervical spine MRI today reported separately.   Electronically Signed   By: Augusto Gamble M.D.   On: 02/12/2014 15:21   Mr Cervical Spine W Wo Contrast  02/12/2014   CLINICAL DATA:  Multiple sclerosis.  EXAM: MRI CERVICAL SPINE WITHOUT AND WITH CONTRAST  TECHNIQUE: Multiplanar and multiecho pulse sequences of the cervical spine, to include the craniocervical junction and cervicothoracic junction, were obtained according to standard protocol without and with intravenous contrast.  CONTRAST:  20mL MULTIHANCE GADOBENATE DIMEGLUMINE 529 MG/ML IV SOLN  COMPARISON:  09/05/2013.  FINDINGS: Alignment: Mild levoconvex torticollis.  No spondylolisthesis.  Vertebrae: Normal. Marrow signal in the visualized skull sub appears normal.  Cord: Normal. No intramedullary lesions, atrophy, or edema. No myelomalacia at to suggest prior episodes of demyelinating disease.  Posterior Fossa: Deferred to MRI brain today.  Vertebral Arteries: Flow voids present bilaterally.  Paraspinal tissues: Normal.  Disc levels:  C2-C3:  Negative.  C3-C4:  Minimal bulge.  No stenosis.  C4-C5:  Minimal bulge.  No stenosis.  C5-C6:  Negative.  C6-C7:  Negative.  C7-T1: Negative. Visualized upper thoracic levels also appear normal.  After gadolinium administration, there is no pathologic enhancement.  IMPRESSION: Normal MRI of the cervical spine with and without contrast. No change from prior. No evidence of multiple sclerosis in the cervical spine.   Electronically Signed   By: Andreas Newport  M.D.   On: 02/12/2014 15:00   Medications: I have reviewed the patient's current medications. Scheduled Meds: . enoxaparin (LOVENOX) injection  40 mg Subcutaneous Q24H  . Glatiramer Acetate  40 mg Subcutaneous Q M,W,F  . methylPREDNISolone (SOLU-MEDROL) injection  1,000 mg Intravenous Q24H  . nystatin  5 mL Oral QID  . pantoprazole (PROTONIX) IV  40 mg Intravenous Daily  . sodium chloride  3 mL Intravenous Q12H   Continuous Infusions:  PRN Meds:.sodium chloride, cyclobenzaprine, hydrOXYzine, ondansetron (ZOFRAN) IV, oxyCODONE, sodium chloride Assessment/Plan:   Multiple sclerosis exacerbation - Neurology on board - Patient has completed IV Solumedrol 1g q24 for 3 days - PT/OT - CIR consulted for possible admission, patient reports she DOES NOT want to go to SNF if cannot go to CIR.    GERD (gastroesophageal reflux disease) -Protonix   Thrush - Nystatin - HIV screen neg  Anxiety - improved -likely complicating treatment -hydroxyzine 50mg  Q6PRN    Back and Leg pain - D/C dilaudid - Oxycodone 5mg  Q4prn  - flexeril 5mg  TIDPRN  DVT PPx: Lovenox Diet: Dys2 Code: Full Dispo: Disposition is deferred at this time, awaiting improvement of current medical problems.  Anticipated discharge in approximately 1-2 day(s).   The patient does not have a current PCP (No Pcp Per Patient) and does not know need an [facility] hospital follow-up appointment after discharge.  The patient  does not know have transportation limitations that hinder transportation to clinic appointments.  .Services Needed at time of discharge: Y = Yes, Blank = No PT:   OT:   RN:   Equipment:   Other:     LOS: 3 days   Carlynn PurlErik Hoffman, DO 02/14/2014, 10:34 AM

## 2014-02-14 NOTE — Progress Notes (Signed)
Erick Colace, MD Physician Signed Physical Medicine and Rehabilitation Consult Note Service date: 02/13/2014 8:15 AM  Related encounter: Admission (Current) from 02/11/2014 in [facility] NEUROSCIENCE           Physical Medicine and Rehabilitation Consult   Reason for Consult: MS exacerbation Referring Physician: Dr. Meredith Pel     HPI: Mary [name] is a 38 y.o. female with h/o depression, chronic pancreatitis, MS diagnosed 08/2013 and on copaxone;who was admitted on 02/11/14 with numbness and weakness RLE progressing to RUE weakness and right facial numbness with stuttering speech.  MRI brain stable without new lesions and MRI cervical spine without evidence of MS involvement. She was started on high dose solumedrol for MS exacerbation per neurology input and reported to have improvement in symptoms. . Patient on liquid diet due to dicomfort with solids and prefers medication via IV route at this time. Needing IV dilaudid for pain and dysesthesias.  PT/ST evaluations done yesterday and CIR recommended by rehab team and MD.    Speech eval reveals no significant swallowing abnormalities- pt fearful of swallowing pills or solids other than minced diet, SLP questions anxiety component vs esophageal phase issue   Review of Systems  Eyes: Positive for blurred vision (right with exaceration and heat).  Respiratory: Negative for cough and shortness of breath.   Cardiovascular: Positive for chest pain (pain around chest).  Gastrointestinal: Negative for heartburn, nausea, abdominal pain and constipation.  Genitourinary: Negative for dysuria, urgency and frequency.  Musculoskeletal: Positive for back pain and myalgias.  Neurological: Positive for sensory change, speech change, focal weakness and headaches.  Psychiatric/Behavioral: Negative for depression and substance abuse. The patient is not nervous/anxious.      Past Medical History   Diagnosis  Date   .   Hernia  03-24-13       ventral hernia remains    .  GERD (gastroesophageal reflux disease)     .  Pancreatitis  03-24-13       2002, 2'2013, 03-14-13   .  Pancreatic cyst  2002 onset   .  Anxiety     .  Depression         "recent breakup with partner of 15 yrs"   .  Adrenal insufficiency  04/23/2013       ??   .  MS (multiple sclerosis)      Past Surgical History   Procedure  Laterality  Date   .  Abdominal surgery    ~ 2007       some sort of pancreatic cyst drainage.    .  Cholecystectomy           ~ age 27-laparoscopic   .  Adenoidectomy       .  Roux-en-y procedure           stent to pancreatic cyst that became infected within 36 hours   .  Eus  N/A  04/14/2013       Procedure: UPPER ENDOSCOPIC ULTRASOUND (EUS) LINEAR;  Surgeon: Rachael Fee, MD;  Location: [facility] END[facility];  Service: Endoscopy;  Laterality: N/A;    Family History   Problem  Relation  Age of Onset   .  Lupus  Neg Hx     .  Sarcoidosis  Neg Hx     .  Pancreatitis  Neg Hx     .  Ataxia  Neg Hx     .  Chorea  Neg Hx     .  Dementia  Neg Hx     .  Mental retardation  Neg Hx     .  Migraines  Neg Hx     .  Multiple sclerosis  Neg Hx     .  Neurofibromatosis  Neg Hx     .  Neuropathy  Neg Hx     .  Parkinsonism  Neg Hx     .  Seizures  Neg Hx     .  Stroke  Neg Hx     .  Colitis  Maternal Aunt     .  Diabetes  Mother     .  Diabetes  Father     .  Diabetes           many family members    Social History:  Lives with mother. Has been unemployed since last July (return[place] from [place] She  reports that she quit smoking about 8 months ago. Her smoking use included Cigarettes. She has a 10 pack-year smoking history. She has never used smokeless tobacco. She reports that she does not drink alcohol or use illicit drugs.      Allergies   Allergen  Reactions   .  Amoxicillin  Anaphylaxis   .  Penicillins  Anaphylaxis   .  Latex  Itching   .  Morphine And Related  Nausea And Vomiting       Medications Prior  to Admission   Medication  Sig  Dispense  Refill   .  b complex vitamins tablet  Take 1 tablet by mouth daily.         .  cholecalciferol (VITAMIN D) 1000 UNITS tablet  Take 5,000 Units by mouth daily.         Marland Kitchen.  Glatiramer Acetate (COPAXONE) 40 MG/ML SOSY  Inject 1 Syringe into the skin 3 (three) times a week.   12 Syringe   0   .  lipase/protease/amylase (CREON-12/PANCREASE) 12000 UNITS CPEP capsule  Take 2 capsules by mouth 3 (three) times daily with meals.   180 capsule   2   .  oxyCODONE (OXY IR/ROXICODONE) 5 MG immediate release tablet  Take 5 mg by mouth every 4 (four) hours as needed for severe pain.         .  pantoprazole (PROTONIX) 40 MG tablet  Take 40 mg by mouth daily.         .  promethazine (PHENERGAN) 25 MG tablet  Take 1 tablet (25 mg total) by mouth every 6 (six) hours as needed for nausea or vomiting.   15 tablet   0      Home: Home Living Family/patient expects to be discharged to:: Private residence Living Arrangements: Other relatives (Mother in-law) Available Help at Discharge: Family;Available 24 hours/day Type of Home: House Home Access: Ramped entrance Home Layout: One level Home Equipment: Cane - single point;Grab bars - tub/shower (tall toilets)   Functional History: Prior Function Level of Independence: Independent with assistive device(s) Comments: Intermittent use of cane during previous exacerbation Functional Status:   Mobility: Bed Mobility Overal bed mobility: Needs Assistance Bed Mobility: Supine to Sit Supine to sit: Min guard;HOB elevated General bed mobility comments: Min guard for safety with HOB elevated. VCs for technique Transfers Overall transfer level: Needs assistance Equipment used: Rolling walker (2 wheeled) Transfers: Sit to/from Stand Sit to Stand: Min guard General transfer comment: Close contact guard for safety. Educated on hand placement. Performed from lowest bed setting and BSC. Ambulation/Gait Ambulation/Gait  assistance:  Min assist Ambulation Distance (Feet): 25 Feet (additional bout of 15 feet.) Assistive device: Rolling walker (2 wheeled) Gait Pattern/deviations: Step-to pattern;Decreased step length - right;Decreased stance time - right;Decreased stride length;Decreased dorsiflexion - right;Decreased weight shift to right Gait velocity interpretation: Below normal speed for age/gender General Gait Details: Pt essentially dragging Rt foot, no dorsiflexion noted. Some ability to flex hip forward. Min assist to advance RLE for correct placement. Pt bears some weight through RUE but reports difficulty due to weakness. Educated on safe DME use.   ADL:   Cognition: Cognition Overall Cognitive Status: Within Functional Limits for tasks assessed Orientation Level: Oriented X4 Cognition Arousal/Alertness: Awake/alert Behavior During Therapy: WFL for tasks assessed/performed Overall Cognitive Status: Within Functional Limits for tasks assessed   Blood pressure 116/71, pulse 55, temperature 97.6 F (36.4 C), temperature source Oral, resp. rate 18, height 5\' 4"  (1.626 m), weight 106.686 kg (235 lb 3.2 oz), last menstrual period 01/27/2014, SpO2 92.00%. Physical Exam  Nursing note and vitals reviewed. Constitutional: She is oriented to person, place, and time. She appears well-developed and well-nourished.  HENT:   Head: Normocephalic and atraumatic.  Eyes: Conjunctivae are normal. Pupils are equal, round, and reactive to light.  Mild edema bilateral eyelids.   Neck: Normal range of motion. Neck supple.  Cardiovascular: Normal rate and regular rhythm.    Murmur heard. Respiratory: Effort normal. No respiratory distress. She has no wheezes.  GI: Soft. Bowel sounds are normal. She exhibits no distension. There is no tenderness.  Musculoskeletal: She exhibits no edema.  Neurological: She is alert and oriented to person, place, and time.  Occasional hesitation noted but normal speech rate. Able to follow  commands without difficulty. Right hemibody dysesthesias reported. Right sided weakness--poor effort.   Skin: Skin is warm and dry.  Psoriatic lesion right knee  RUE 3-/5 RLE 2- HF, KE, trace ADF/PF 5/5 on Left side Sensory mildly diminished RLE    Results for orders placed during the hospital encounter of 02/11/14 (from the past 24 hour(s))   BASIC METABOLIC PANEL     Status: Abnormal     Collection Time      02/13/14  5:31 AM       Result  Value  Ref Range     Sodium  134 (*)  137 - 147 mEq/L     Potassium  6.3 (*)  3.7 - 5.3 mEq/L     Chloride  101   96 - 112 mEq/L     CO2  18 (*)  19 - 32 mEq/L     Glucose, Bld  173 (*)  70 - 99 mg/dL     BUN  14   6 - 23 mg/dL     Creatinine, Ser  0.45 (*)  0.50 - 1.10 mg/dL     Calcium  8.7   8.4 - 10.5 mg/dL     GFR calc non Af Amer  >90   >90 mL/min     GFR calc Af Amer  >90   >90 mL/min    Ct Head Wo Contrast   02/11/2014   CLINICAL DATA:  Right-sided weakness.  Multiple sclerosis.  EXAM: CT HEAD WITHOUT CONTRAST  TECHNIQUE: Contiguous axial images were obtained from the base of the skull through the vertex without intravenous contrast.  COMPARISON:  Head MRI 09/05/2013 and CT 04/22/2013  FINDINGS: Ventricles and sulci are within normal limits for age. There is no evidence of acute cortical infarct, intracranial hemorrhage, mass, midline shift,  or extra-axial fluid collection. Extensive, patchy hypodensities throughout the cerebral white matter bilaterally are similar to the prior CT. Orbits are unremarkable. Mastoid air cells and visualized paranasal sinuses are clear.  IMPRESSION: 1. No evidence of acute intracranial abnormality. 2. Unchanged cerebral white matter disease.   Electronically Signed   By: Sebastian Ache   On: 02/11/2014 18:43    Mr Brain W Wo Contrast   02/12/2014   CLINICAL DATA:  38 year old female with newly diagnosed relapsing remitting multiple sclerosis. Right side numbness tingling pain in weakness x1 day. Initial  encounter. History of chronic pancreatitis.  EXAM: MRI HEAD WITHOUT AND WITH CONTRAST  TECHNIQUE: Multiplanar, multiecho pulse sequences of the brain and surrounding structures were obtained without and with intravenous contrast.  CONTRAST:  20mL MULTIHANCE GADOBENATE DIMEGLUMINE 529 MG/ML IV SOLN in conjunction with contrast enhanced imaging of the cervical spine reported separately.  COMPARISON:  Brain MRI 09/05/2013, 04/22/2013.  FINDINGS: Stable cerebral volume. Widespread abnormal cerebral white matter T2 and FLAIR hyperintensity re- identified. Confluent involvement of the pons again noted. Mild involvement in the right cerebellar white matter again noted. Comparing sagittal FLAIR imaging, the extent of disease is stable since 04/22/2013. No lesions with restricted diffusion. No enhancing lesions.  Underlying normal cerebral volume. No restricted diffusion to suggest acute infarction. No midline shift, mass effect, evidence of mass lesion, ventriculomegaly, extra-axial collection or acute intracranial hemorrhage. Cervicomedullary junction and pituitary are within normal limits. Major intracranial vascular flow voids are stable.  Cervical spine reported separately.  No new signal abnormality identified. Deep gray matter nuclei and medulla remain spared. No cortical encephalomalacia.  Visible internal auditory structures appear normal. Visualized paranasal sinuses and mastoids are clear. Grossly normal orbits soft tissues. Normal bone marrow signal. Visualized scalp soft tissues are within normal limits.  IMPRESSION: 1. Abnormal cerebral white matter, cerebellar white matter, and pontine signal abnormality compatible with multiple sclerosis is stable since 04/22/2013. No new lesions and no evidence of acute demyelination identified. 2. No new intracranial abnormality. 3. Cervical spine MRI today reported separately.   Electronically Signed   By: Augusto Gamble M.D.   On: 02/12/2014 15:21    Mr Cervical Spine W Wo  Contrast   02/12/2014   CLINICAL DATA:  Multiple sclerosis.  EXAM: MRI CERVICAL SPINE WITHOUT AND WITH CONTRAST  TECHNIQUE: Multiplanar and multiecho pulse sequences of the cervical spine, to include the craniocervical junction and cervicothoracic junction, were obtained according to standard protocol without and with intravenous contrast.  CONTRAST:  20mL MULTIHANCE GADOBENATE DIMEGLUMINE 529 MG/ML IV SOLN  COMPARISON:  09/05/2013.  FINDINGS: Alignment: Mild levoconvex torticollis.  No spondylolisthesis.  Vertebrae: Normal. Marrow signal in the visualized skull sub appears normal.  Cord: Normal. No intramedullary lesions, atrophy, or edema. No myelomalacia at to suggest prior episodes of demyelinating disease.  Posterior Fossa: Deferred to MRI brain today.  Vertebral Arteries: Flow voids present bilaterally.  Paraspinal tissues: Normal.  Disc levels:  C2-C3:  Negative.  C3-C4:  Minimal bulge.  No stenosis.  C4-C5:  Minimal bulge.  No stenosis.  C5-C6:  Negative.  C6-C7:  Negative.  C7-T1: Negative. Visualized upper thoracic levels also appear normal.  After gadolinium administration, there is no pathologic enhancement.  IMPRESSION: Normal MRI of the cervical spine with and without contrast. No change from prior. No evidence of multiple sclerosis in the cervical spine.   Electronically Signed   By: Andreas Newport M.D.   On: 02/12/2014 15:00     Assessment/Plan: Diagnosis: MS exacerbation  but no new cervical or intracranial lesions, mainly with R HP and neurogenic stuttering Does the need for close, 24 hr/day medical supervision in concert with the patient's rehab needs make it unreasonable for this patient to be served in a less intensive setting? Yes Co-Morbidities requiring supervision/potential complications: morbid obesity, anxiety, GERD Due to bladder management, safety, education, pain management, does the patient require 24 hr/day rehab nursing? Yes Does the patient require coordinated care of a  physician, rehab nurse, PT (1-2 hrs/day, 5 days/week), OT (1-2 hrs/day, 5 days/week) and SLP (.5-1 hrs/day, 5 days/week) to address physical and functional deficits in the context of the above medical diagnosis(es)? Yes Addressing deficits in the following areas: balance, endurance, locomotion, strength, transferring, bathing, dressing, feeding, grooming, toileting, cognition, speech, swallowing and psychosocial support Can the patient actively participate in an intensive therapy program of at least 3 hrs of therapy per day at least 5 days per week? Yes The potential for patient to make measurable gains while on inpatient rehab is excellent Anticipated functional outcomes upon discharge from inpatient rehab are modified independent  with PT, modified independent with OT, modified independent with SLP. Estimated rehab length of stay to reach the above functional goals is: 7-10days Does the patient have adequate social supports to accommodate these discharge functional goals? Potentially Anticipated D/C setting: Home Anticipated post D/C treatments: HH therapy Overall Rehab/Functional Prognosis: good   RECOMMENDATIONS: This patient's condition is appropriate for continued rehabilitative care in the following setting: CIR Patient has agreed to participate in recommended program. Yes Note that insurance prior authorization may be required for reimbursement for recommended care.   Comment: rec gabapentin for dysesthetic pain rather than IV narcotics, switch to po meds given no pharyngeal dysphagia       02/13/2014    Revision History...     Date/Time User Action   02/13/2014 4:19 PM Erick Colace, MD Sign   02/13/2014 4:15 PM Erick Colace, MD Share   02/13/2014 10:13 AM Jacquelynn Cree, PA-C Share  View Details Report   Routing History...     Date/Time From To Method   02/13/2014 4:19 PM Erick Colace, MD Erick Colace, MD In Basket   02/13/2014 4:19 PM Erick Colace, MD No Pcp Per Patient In Basket

## 2014-02-14 NOTE — Progress Notes (Signed)
Rehab admissions - I received a call back from pt's step mom [name] and answered her questions about CIR. She is pleased that pt can come to inpatient rehab and stated she will be able to provide support for pt at discharge.   Pt will be admitted later today to CIR. Thanks.  Juliann Mule, PT Rehabilitation Admissions Coordinator [phone]

## 2014-02-14 NOTE — PMR Pre-admission (Signed)
PMR Admission Coordinator Pre-Admission Assessment  Patient: Mary [name] is an 38 y.o., female MRN: [id number] DOB: 11/27/1975 Height: 5\' 4"  (162.6 cm) Weight: 106.686 kg (235 lb 3.2 oz)              Insurance Information HMO:     PPO:      PCP:      IPA:      80/20:      OTHER:  PRIMARY: self pay        Medicaid Application Date: pt's step mom states that they are waiting for pt to be declared disabled from the state and then, they will apply for Medicaid. (Pt was diagnosed with MS in January 2015).        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 female with h/o depression, chronic pancreatitis, MS diagnosed 08/2013 and on copaxone;who was admitted on 02/11/14 with numbness and weakness RLE progressing to RUE weakness and right facial numbness with stuttering speech.  MRI brain stable without new lesions and MRI cervical spine without evidence of MS involvement. She was started on high dose solumedrol for MS exacerbation per neurology input and reported to have improvement in symptoms. . Patient on liquid diet due to dicomfort with solids and prefers medication via IV route at this time. Needing IV dilaudid for pain and dysesthesias.  PT/ST evaluations done yesterday and CIR recommended by rehab team and MD.   Speech eval reveals no significant swallowing abnormalities- pt fearful of swallowing pills or solids other than minced diet, SLP questions anxiety component vs esophageal phase issue.   Past Medical History  Past Medical History  Diagnosis Date  . Hernia 03-24-13    ventral hernia remains   . GERD (gastroesophageal reflux disease)    . Pancreatitis 03-24-13    2002, 2'2013, 03-14-13  . Pancreatic cyst 2002 onset  . Anxiety   . Depression     "recent breakup with partner of 15 yrs"  . Adrenal insufficiency 04/23/2013    ??  . MS (multiple sclerosis)     Family History  family history includes Colitis in her maternal aunt; Diabetes in her father, mother, and another family member. There is no history of Lupus, Sarcoidosis, Pancreatitis, Ataxia, Chorea, Dementia, Mental retardation, Migraines, Multiple sclerosis, Neurofibromatosis, Neuropathy, Parkinsonism, Seizures, or Stroke.  Prior Rehab/Hospitalizations: none   Current Medications  Current facility-administered medications:0.9 %  sodium chloride infusion, 250 mL, Intravenous, PRN, Genelle GatherKathryn F Glenn, MD;  cyclobenzaprine (FLEXERIL) tablet 5 mg, 5 mg, Oral, TID PRN, Carlynn PurlErik Hoffman, DO, 5 mg at 02/14/14 1237;  enoxaparin (LOVENOX) injection 40 mg, 40 mg, Subcutaneous, Q24H, Genelle GatherKathryn F Glenn, MD;  Glatiramer Acetate SOSY 40 mg, 40 mg, Subcutaneous, Q [name],W,F, Carlynn PurlErik Hoffman, DO, 40 mg at 02/13/14 1908 hydrOXYzine (ATARAX/VISTARIL) tablet 50 mg, 50 mg, Oral, Q6H PRN, Carlynn PurlErik Hoffman, DO, 50 mg at 02/14/14 [id number];  methylPREDNISolone sodium succinate (SOLU-MEDROL) 1,000 mg in sodium chloride 0.9 % 50 mL IVPB, 1,000 mg, Intravenous, Q24H, Ritta SlotMcNeill Kirkpatrick, MD, 1,000 mg at 02/13/14 2235;  nystatin (MYCOSTATIN) 100000 UNIT/ML suspension 500,000 Units, 5 mL, Oral, QID, Carlynn PurlErik Hoffman, DO, 500,000 Units at 02/14/14 1014 ondansetron [facility](ZOFRAN) injection 4  mg, 4 mg, Intravenous, Q6H PRN, Genelle Gather, MD, 4 mg at 02/12/14 2044;  oxyCODONE (ROXICODONE) 5 MG/5ML solution 5 mg, 5 mg, Oral, Q4H PRN, Aletta Edouard, MD, 5 mg at 02/14/14 1344;  pantoprazole (PROTONIX) injection 40 mg, 40 mg, Intravenous, Daily, Genelle Gather, MD, 40 mg at 02/14/14 1009;  sodium chloride 0.9 % injection 3 mL, 3 mL, Intravenous, Q12H, Genelle Gather, MD, 3 mL at 02/14/14 0804 sodium chloride 0.9 % injection 3 mL, 3 mL,  Intravenous, PRN, Genelle Gather, MD  Patients Current Diet: Dysphagia level 2, thin liquids  Precautions / Restrictions Precautions Precautions: Fall Precaution Comments: right leg weakness Restrictions Weight Bearing Restrictions: No   Prior Activity Level Community (5-7x/wk): pt was independent prior, driving and used a straight point cane occasionally since MS dx. She got out everyday and was independent in all ADL's. Pt helped her step-mom out with chores around the house.  Home Assistive Devices / Equipment Home Assistive Devices/Equipment: None Home Equipment: Cane - single point;Grab bars - tub/shower (tall toilets)  Prior Functional Level Prior Function Level of Independence: Independent with assistive device(s) Comments: Intermittent use of cane during previous exacerbation  Current Functional Level Cognition  Overall Cognitive Status: Within Functional Limits for tasks assessed Orientation Level: Oriented X4    Extremity Assessment (includes Sensation/Coordination)          ADLs    not assessed, anticipate OT needs as well.   Mobility  Overal bed mobility: Modified Independent Bed Mobility: Supine to Sit Supine to sit: Min guard;HOB elevated General bed mobility comments: Mod I due to slow speed of movement    Transfers  Overall transfer level: Needs assistance Equipment used: Rolling walker (2 wheeled) Transfers: Sit to/from Stand Sit to Stand: Min guard General transfer comment: Min guard assist with RW.  Pt pushing up from sitting surface appropriately, verbal cues for safe hand placement during transition to sit    Ambulation / Gait / Stairs / Wheelchair Mobility  Ambulation/Gait Ambulation/Gait assistance: Hydrographic surveyor (Feet): 75 Feet Assistive device: Rolling walker (2 wheeled) Gait Pattern/deviations: Step-through pattern;Decreased step length - right;Decreased stance time - right;Decreased dorsiflexion - right;Decreased weight  shift to right;Trunk flexed Gait velocity: decreased Gait velocity interpretation: Below normal speed for age/gender General Gait Details: Pt has inconsistant gait pattern vs. MMT.  Trace ankle DF, 2+/5 knee extension, however, with gait her only observed deficit is the foot drag.  She does not buckle over the right knee or snap it back into hyperextension with stance.  She slowly progresses the hip forward, but there is no trendelenberg pattern.  She shows 5/5 strength in the right upper extremity because she is bearing weight equally on bil arms to unweight her leg during gait.  Flexed posture.  Verbal cues for safe use of RW and upright posture.     Posture / Balance   Overall balance assessment: Needs assistance  Sitting-balance support: Feet supported;No upper extremity supported Sitting balance-Leahy Scale: Good   Standing balance support: Single extremity supported Standing balance-Leahy Scale: Poor Standing balance comment: needs support even in bathroom with one upper extremity for balance.     Special needs/care consideration BiPAP/CPAP no  CPM no  Continuous Drip IV no Dialysis no         Life Vest no  Oxygen no  Special Bed no  Trach Size no  Wound Vac (area) no        Skin no issues  Bowel mgmt: last BM on 02-11-14 Bladder mgmt: using commode with assist Diabetic mgmt no   Previous Home Environment Living Arrangements: Other relatives (step-mom and friend) Available Help at Discharge: Family;Available 24 hours/day Type of Home: House Home Layout: One level Home Access: Ramped entrance Home Care Services: No  Discharge Living Setting Plans for Discharge Living Setting:  (will be at step mom's house or friend's apartment) Type of Home at Discharge: House Elease Hashimoto has ground floor apartment, step mom has house) Discharge Home Layout: One level Discharge Home Access: Other (comment) (ramp to enter step mom's house; 3 STE friend's  apartment) Does the patient have any problems obtaining your medications?: No  Social/Family/Support Systems Contact Information: Step mom [name] is primary contact Anticipated Caregiver: step mom and friend Elease Hashimoto Anticipated Industrial/product designer Information: see above Ability/Limitations of Caregiver: step mom is an Charity fundraiser, does work from home (doing telephone work) and can help pt (note: pt's goals are for [employer] ) Caregiver Availability: 24/7 (plan is to stay @ step mom's during week, Patricia's over the weekends) Discharge Plan Discussed with Primary Caregiver: Yes, spoke with [name], step mother by phone on 02-14-14. Is Caregiver In Agreement with Plan?: Yes Does Caregiver/Family have Issues with Lodging/Transportation while Pt is in Rehab?: No  Goals/Additional Needs Patient/Family Goal for Rehab: Mod Ind with PT, OT and SLP Expected length of stay: 7-10 days Cultural Considerations: none Dietary Needs: dys 2, thin liquids Equipment Needs: to be determined Pt/Family Agrees to Admission and willing to participate: Yes Program Orientation Provided & Reviewed with Pt/Caregiver Including Roles  & Responsibilities: Yes   Decrease burden of Care through IP rehab admission: NA  Possible need for SNF placement upon discharge: not anticipated  Patient Condition: This patient's condition remains as documented in the consult dated 02-13-14, in which the Rehabilitation Physician determined and documented that the patient's condition is appropriate for intensive rehabilitative care in an inpatient rehabilitation facility. Will admit to inpatient rehab today.  Preadmission Screen Completed By:  Juliann Mule, PT 02/14/2014 2:03 PM ______________________________________________________________________   Discussed status with Dr. Wynn Banker on 02-14-14 at 1416 and received telephone approval for admission today.  Admission Coordinator:  Juliann Mule, PT time  1416/Date 02-14-14

## 2014-02-14 NOTE — Progress Notes (Signed)
Read, reviewed, edited and agree with student's findings and recommendations.  Rebecca B. Medendorp, PT, DPT #[phone]  

## 2014-02-14 NOTE — Progress Notes (Signed)
Rehab admissions - I met with pt in follow up to rehab MD consult. She is interested in pursuing inpatient rehab. Questions were answered and informational brochures were given.  I spoke with Dr. Eula Fried about pt's case and received medical clearance to bring pt to CIR. Bed is available and will admit to CIR later today.  I left voiemails with pt's mom and friend, Lyndel Pleasure per pt request. I also updated pt's RN and Loma Sousa, case Freight forwarder.   Please call me with any questions. Thanks.  Nanetta Batty, PT Rehabilitation Admissions Coordinator [phone]

## 2014-02-14 NOTE — Progress Notes (Signed)
Andrey Farmer Shadee Montoya Rehab Admission Coordinator Signed Physical Medicine and Rehabilitation PMR Pre-admission Service date: 02/14/2014 2:03 PM   PMR Admission Coordinator Pre-Admission Assessment  Patient: Mary [name] is an 38 y.o., female  MRN: [id number]  DOB: [birth year]  Height: 5\' 4"  (162.6 cm)  Weight: 106.686 kg (235 lb 3.2 oz)  Insurance Information  HMO: PPO: PCP: IPA: 80/20: OTHER:  PRIMARY: self pay  Medicaid Application Date: pt's step mom states that they are waiting for pt to be declared disabled from the state and then, they will apply for Medicaid. (Pt was diagnosed with MS in January 2015).  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 female with h/o depression, chronic pancreatitis, MS diagnosed 08/2013 and on copaxone;who was admitted on 02/11/14 with numbness and weakness RLE progressing to RUE weakness and right facial numbness with stuttering speech. MRI brain stable without new lesions and MRI cervical spine without evidence of MS involvement. She was started on high dose solumedrol for MS exacerbation per neurology input and reported to have improvement in symptoms. . Patient on liquid diet due to dicomfort with solids and prefers medication via IV route at this time. Needing IV dilaudid for pain and dysesthesias. PT/ST evaluations done yesterday and CIR recommended by rehab team and MD.   Speech eval reveals no significant swallowing abnormalities- pt fearful of swallowing pills or solids other than minced diet, SLP questions anxiety component vs esophageal phase issue.  Past Medical History    Past Medical  History    Diagnosis  Date    .  Hernia  03-24-13      ventral hernia remains    .  GERD (gastroesophageal reflux disease)     .  Pancreatitis  03-24-13      2002, 2'2013, 03-14-13    .  Pancreatic cyst  2002 onset    .  Anxiety     .  Depression       "recent breakup with partner of 15 yrs"    .  Adrenal insufficiency  04/23/2013      ??    .  MS (multiple sclerosis)      Family History  family history includes Colitis in her maternal aunt; Diabetes in her father, mother, and another family member. There is no history of Lupus, Sarcoidosis, Pancreatitis, Ataxia, Chorea, Dementia, Mental retardation, Migraines, Multiple sclerosis, Neurofibromatosis, Neuropathy, Parkinsonism, Seizures, or Stroke.  Prior Rehab/Hospitalizations: none  Current Medications  Current facility-administered medications:0.9 % sodium chloride infusion, 250 mL, Intravenous, PRN, Genelle Gather, MD; cyclobenzaprine (FLEXERIL) tablet 5 mg, 5 mg, Oral, TID PRN, Carlynn Purl, DO, 5 mg at 02/14/14 1237; enoxaparin (LOVENOX) injection 40 mg, 40 mg, Subcutaneous, Q24H, Genelle Gather, MD; Glatiramer Acetate SOSY 40 mg, 40 mg, Subcutaneous, Q M,W,F, Carlynn Purl, DO, 40 mg at 02/13/14 1908  hydrOXYzine (ATARAX/VISTARIL) tablet 50 mg, 50 mg, Oral, Q6H PRN, Carlynn Purl, DO, 50 mg at 02/14/14 6578; methylPREDNISolone sodium succinate (SOLU-MEDROL) 1,000 mg in sodium chloride 0.9 % 50 mL IVPB, 1,000 mg, Intravenous, Q24H, Ritta Slot,  MD, 1,000 mg at 02/13/14 2235; nystatin (MYCOSTATIN) 100000 UNIT/ML suspension 500,000 Units, 5 mL, Oral, QID, Carlynn Purl, DO, 500,000 Units at 02/14/14 1014  ondansetron [facility]) injection 4 mg, 4 mg, Intravenous, Q6H PRN, Genelle Gather, MD, 4 mg at 02/12/14 2044; oxyCODONE (ROXICODONE) 5 MG/5ML solution 5 mg, 5 mg, Oral, Q4H PRN, Aletta Edouard, MD, 5 mg at 02/14/14 1344; pantoprazole (PROTONIX) injection 40 mg, 40 mg, Intravenous, Daily, Genelle Gather, MD, 40 mg at 02/14/14 1009; sodium  chloride 0.9 % injection 3 mL, 3 mL, Intravenous, Q12H, Genelle Gather, MD, 3 mL at 02/14/14 0804  sodium chloride 0.9 % injection 3 mL, 3 mL, Intravenous, PRN, Genelle Gather, MD  Patients Current Diet: Dysphagia level 2, thin liquids  Precautions / Restrictions  Precautions  Precautions: Fall  Precaution Comments: right leg weakness  Restrictions  Weight Bearing Restrictions: No  Prior Activity Level  Community (5-7x/wk): pt was independent prior, driving and used a straight point cane occasionally since MS dx. She got out everyday and was independent in all ADL's. Pt helped her step-mom out with chores around the house.  Home Assistive Devices / Equipment  Home Assistive Devices/Equipment: None  Home Equipment: Cane - single point;Grab bars - tub/shower (tall toilets)  Prior Functional Level  Prior Function  Level of Independence: Independent with assistive device(s)  Comments: Intermittent use of cane during previous exacerbation  Current Functional Level    Cognition  Overall Cognitive Status: Within Functional Limits for tasks assessed  Orientation Level: Oriented X4    Extremity Assessment  (includes Sensation/Coordination)      ADLs  not assessed, anticipate OT needs as well.    Mobility  Overal bed mobility: Modified Independent  Bed Mobility: Supine to Sit  Supine to sit: Min guard;HOB elevated  General bed mobility comments: Mod I due to slow speed of movement    Transfers  Overall transfer level: Needs assistance  Equipment used: Rolling walker (2 wheeled)  Transfers: Sit to/from Stand  Sit to Stand: Min guard  General transfer comment: Min guard assist with RW. Pt pushing up from sitting surface appropriately, verbal cues for safe hand placement during transition to sit    Ambulation / Gait / Stairs / Wheelchair Mobility  Ambulation/Gait  Ambulation/Gait assistance: Regulatory affairs officer (Feet): 75 Feet  Assistive device: Rolling walker (2 wheeled)  Gait  Pattern/deviations: Step-through pattern;Decreased step length - right;Decreased stance time - right;Decreased dorsiflexion - right;Decreased weight shift to right;Trunk flexed  Gait velocity: decreased  Gait velocity interpretation: Below normal speed for age/gender  General Gait Details: Pt has inconsistant gait pattern vs. MMT. Trace ankle DF, 2+/5 knee extension, however, with gait her only observed deficit is the foot drag. She does not buckle over the right knee or snap it back into hyperextension with stance. She slowly progresses the hip forward, but there is no trendelenberg pattern. She shows 5/5 strength in the right upper extremity because she is bearing weight equally on bil arms to unweight her leg during gait. Flexed posture. Verbal cues for safe use of RW and upright posture.    Posture / Balance  Overall balance assessment: Needs assistance  Sitting-balance support: Feet supported;No upper extremity supported  Sitting balance-Leahy Scale: Good  Standing balance support: Single extremity supported  Standing balance-Leahy Scale: Poor  Standing balance comment: needs support even in bathroom with one upper extremity for balance.    Special needs/care consideration  BiPAP/CPAP no  CPM no  Continuous Drip  IV no  Dialysis no  Life Vest no  Oxygen no  Special Bed no  Trach Size no  Wound Vac (area) no  Skin no issues  Bowel mgmt: last BM on 02-11-14  Bladder mgmt: using commode with assist  Diabetic mgmt no    Previous Home Environment  Living Arrangements: Other relatives (step-mom and friend)  Available Help at Discharge: Family;Available 24 hours/day  Type of Home: House  Home Layout: One level  Home Access: Ramped entrance  Home Care Services: No  Discharge Living Setting  Plans for Discharge Living Setting: (will be at step mom's house or friend's apartment)  Type of Home at Discharge: House Elease Hashimoto(Patricia has ground floor apartment, step mom has house)  Discharge Home  Layout: One level  Discharge Home Access: Other (comment) (ramp to enter step mom's house; 3 STE friend's apartment)  Does the patient have any problems obtaining your medications?: No  Social/Family/Support Systems  Contact Information: Step mom Stephanie CoupLeslie Strickland is primary contact  Anticipated Caregiver: step mom and friend Elease Hashimotoatricia  Anticipated Industrial/product designerCaregiver's Contact Information: see above  Ability/Limitations of Caregiver: step mom is an Charity fundraiserN, does work from home (doing telephone work) and can help pt (note: pt's goals are for [employer] )  Caregiver Availability: 24/7 (plan is to stay @ step mom's during week, Patricia's over the weekends)  Discharge Plan Discussed with Primary Caregiver: Yes, spoke with Stephanie CoupLeslie Strickland, step mother by phone on 02-14-14.  Is Caregiver In Agreement with Plan?: Yes  Does Caregiver/Family have Issues with Lodging/Transportation while Pt is in Rehab?: No  Goals/Additional Needs  Patient/Family Goal for Rehab: Mod Ind with PT, OT and SLP  Expected length of stay: 7-10 days  Cultural Considerations: none  Dietary Needs: dys 2, thin liquids  Equipment Needs: to be determined  Pt/Family Agrees to Admission and willing to participate: Yes  Program Orientation Provided & Reviewed with Pt/Caregiver Including Roles & Responsibilities: Yes  Decrease burden of Care through IP rehab admission: NA  Possible need for SNF placement upon discharge: not anticipated  Patient Condition: This patient's condition remains as documented in the consult dated 02-13-14, in which the Rehabilitation Physician determined and documented that the patient's condition is appropriate for intensive rehabilitative care in an inpatient rehabilitation facility. Will admit to inpatient rehab today.  Preadmission Screen Completed By: Juliann MuleJanine Lakiyah Arntson, PT 02/14/2014 2:03 PM  ______________________________________________________________________  Discussed status with Dr. Wynn BankerKirsteins on 02-14-14 at 1416  and received telephone approval for admission today.  Admission Coordinator: Juliann MuleJanine Harriett Azar, PT time 1416/Date 02-14-14    Cosigned by: Erick ColaceAndrew E Kirsteins, MD [02/14/2014 2:22 PM]

## 2014-02-14 NOTE — Progress Notes (Signed)
Patient transferred to rehab this evening. Patient alert and oriented, voiding adequate amount of urine. Still c/o pain. RN notify patient of pain medication due time. Patient not happy with the result. MD ordered new med and patient refused to take medication. Patient stated "I know people that take this medication and what it did to them so i am not taking it and all those rat poison in the medications".  Aisha RN

## 2014-02-14 NOTE — Discharge Summary (Signed)
Name: Mary [name] MRN: [id number] DOB: Nov 06, 1975 38 y.o. PCP: No Pcp Per Patient  Date of Admission: 02/11/2014  3:31 PM Date of Discharge: 02/14/2014 Attending Physician: Aletta Edouard, MD  Discharge Diagnosis: Principal Problem:   Multiple sclerosis exacerbation Active Problems:   Obesity, morbid   Relapsing remitting multiple sclerosis   GERD (gastroesophageal reflux disease)   Thrush, oral  Discharge Medications:   Medication List         b complex vitamins tablet  Take 1 tablet by mouth daily.     cholecalciferol 1000 UNITS tablet  Commonly known as:  VITAMIN D  Take 5,000 Units by mouth daily.     cyclobenzaprine 5 MG tablet  Commonly known as:  FLEXERIL  Take 1 tablet (5 mg total) by mouth 3 (three) times daily as needed for muscle spasms.     gabapentin 100 MG capsule  Commonly known as:  NEURONTIN  Take 1 capsule (100 mg total) by mouth 3 (three) times daily.     Glatiramer Acetate 40 MG/ML Sosy  Commonly known as:  COPAXONE  Inject 1 Syringe into the skin 3 (three) times a week.     hydrOXYzine 50 MG tablet  Commonly known as:  ATARAX/VISTARIL  Take 1 tablet (50 mg total) by mouth every 6 (six) hours as needed for anxiety.     lipase/protease/amylase 95621 UNITS Cpep capsule  Commonly known as:  CREON-12/PANCREASE  Take 2 capsules by mouth 3 (three) times daily with meals.     oxyCODONE 5 MG immediate release tablet  Commonly known as:  Oxy IR/ROXICODONE  Take 5 mg by mouth every 4 (four) hours as needed for severe pain.     pantoprazole 40 MG tablet  Commonly known as:  PROTONIX  Take 40 mg by mouth daily.     promethazine 25 MG tablet  Commonly known as:  PHENERGAN  Take 1 tablet (25 mg total) by mouth every 6 (six) hours as needed for nausea or vomiting.        Disposition and follow-up:   [name] was discharged from [name] in Stable condition.  At the hospital follow up visit please address:  1.   Progress at inpatient rehab.  Pain control  2.  Labs / imaging needed at time of follow-up: None  3.  Pending labs/ test needing follow-up: None  Follow-up Appointments: Follow-up Information   Follow up with Mary [name]. Schedule an appointment as soon as possible for a visit in 1 month.   Specialty:  Neurology   Contact information:   [address]  [address] [place] [address] [phone]       Discharge Instructions: Discharge Instructions   Diet - low sodium heart healthy    Complete by:  As directed      Walk with assistance    Complete by:  As directed            Consultations:    Procedures Performed:  Ct Head Wo Contrast  02/11/2014   CLINICAL DATA:  Right-sided weakness.  Multiple sclerosis.  EXAM: CT HEAD WITHOUT CONTRAST  TECHNIQUE: Contiguous axial images were obtained from the base of the skull through the vertex without intravenous contrast.  COMPARISON:  Head MRI 09/05/2013 and CT 04/22/2013  FINDINGS: Ventricles and sulci are within normal limits for age. There is no evidence of acute cortical infarct, intracranial hemorrhage, mass, midline shift, or extra-axial fluid collection. Extensive, patchy hypodensities throughout  the cerebral white matter bilaterally are similar to the prior CT. Orbits are unremarkable. Mastoid air cells and visualized paranasal sinuses are clear.  IMPRESSION: 1. No evidence of acute intracranial abnormality. 2. Unchanged cerebral white matter disease.   Electronically Signed   By: Mary [name]   On: 02/11/2014 18:43   Mr Brain W Wo Contrast  02/12/2014   CLINICAL DATA:  38 year old female with newly diagnosed relapsing remitting multiple sclerosis. Right side numbness tingling pain in weakness x1 day. Initial encounter. History of chronic pancreatitis.  EXAM: MRI HEAD WITHOUT AND WITH CONTRAST  TECHNIQUE: Multiplanar, multiecho pulse sequences of the brain and surrounding structures were obtained without and with  intravenous contrast.  CONTRAST:  71mL MULTIHANCE GADOBENATE DIMEGLUMINE 529 MG/ML IV SOLN in conjunction with contrast enhanced imaging of the cervical spine reported separately.  COMPARISON:  Brain MRI 09/05/2013, 04/22/2013.  FINDINGS: Stable cerebral volume. Widespread abnormal cerebral white matter T2 and FLAIR hyperintensity re- identified. Confluent involvement of the pons again noted. Mild involvement in the right cerebellar white matter again noted. Comparing sagittal FLAIR imaging, the extent of disease is stable since 04/22/2013. No lesions with restricted diffusion. No enhancing lesions.  Underlying normal cerebral volume. No restricted diffusion to suggest acute infarction. No midline shift, mass effect, evidence of mass lesion, ventriculomegaly, extra-axial collection or acute intracranial hemorrhage. Cervicomedullary junction and pituitary are within normal limits. Major intracranial vascular flow voids are stable.  Cervical spine reported separately.  No new signal abnormality identified. Deep gray matter nuclei and medulla remain spared. No cortical encephalomalacia.  Visible internal auditory structures appear normal. Visualized paranasal sinuses and mastoids are clear. Grossly normal orbits soft tissues. Normal bone marrow signal. Visualized scalp soft tissues are within normal limits.  IMPRESSION: 1. Abnormal cerebral white matter, cerebellar white matter, and pontine signal abnormality compatible with multiple sclerosis is stable since 04/22/2013. No new lesions and no evidence of acute demyelination identified. 2. No new intracranial abnormality. 3. Cervical spine MRI today reported separately.   Electronically Signed   By: Mary Gamble M.D.   On: 02/12/2014 15:21   Mr Cervical Spine W Wo Contrast  02/12/2014   CLINICAL DATA:  Multiple sclerosis.  EXAM: MRI CERVICAL SPINE WITHOUT AND WITH CONTRAST  TECHNIQUE: Multiplanar and multiecho pulse sequences of the cervical spine, to include the  craniocervical junction and cervicothoracic junction, were obtained according to standard protocol without and with intravenous contrast.  CONTRAST:  65mL MULTIHANCE GADOBENATE DIMEGLUMINE 529 MG/ML IV SOLN  COMPARISON:  09/05/2013.  FINDINGS: Alignment: Mild levoconvex torticollis.  No spondylolisthesis.  Vertebrae: Normal. Marrow signal in the visualized skull sub appears normal.  Cord: Normal. No intramedullary lesions, atrophy, or edema. No myelomalacia at to suggest prior episodes of demyelinating disease.  Posterior Fossa: Deferred to MRI brain today.  Vertebral Arteries: Flow voids present bilaterally.  Paraspinal tissues: Normal.  Disc levels:  C2-C3:  Negative.  C3-C4:  Minimal bulge.  No stenosis.  C4-C5:  Minimal bulge.  No stenosis.  C5-C6:  Negative.  C6-C7:  Negative.  C7-T1: Negative. Visualized upper thoracic levels also appear normal.  After gadolinium administration, there is no pathologic enhancement.  IMPRESSION: Normal MRI of the cervical spine with and without contrast. No change from prior. No evidence of multiple sclerosis in the cervical spine.   Electronically Signed   By: Andreas Newport M.D.   On: 02/12/2014 15:00   Admission HPI: Mary [name] is a 38 year old pleasant woman with past medical history of newly diagnosed relapsing-remitting  MS, chronic pancreatitis, anxiety, depression, and GERD who presents with right sided numbness/tingling, pain, and weakness of 1 day duration.  Pt reports that around 7 pm last night she began having numbness and tingling in her right leg along with pain and weakness. She then began having problems with coordination and difficulty walking as well. This morning when she woke at 7 AM her symptoms were worse. At noon her right arm was also numb/tingling with weakness and pain. At 2 PM she called her neurologist's office (Dr. Jearl KlinefelterJaffy) who told her to call 911 after which she was brought to the ED. She also reports having blurry vision, stuttering,  numbness of the right side of her face. She reports being under a lot of stress lately. Her symptoms worsen when overheated but none recently.  She was diagnosed with relapsing-remititng MS January 26 of this year with lumber tap and MRI after work-up for chronic pancreatitis revealed demyelinating disease on CT Head. She has chronic fatigue, occasional paraesthesias on her right side, muscles spasms in her back, occasional ataxia ("druken-sober"), mild cognitive impairment (short-term memory loss and word finding), band-like sensation around her chest/torso, and electrical sensation down her legs. She reports these chronic symptoms have not been debilitating. She denies dysphagia, urinary/bowel incontinence, or constipation. She has been on copaxone SQ x3 weekly since the beginning of February and has not missed any injections. She has never had a MS flare before and has never been on IV steroids. She denies FH of MS.   Hospital Course by problem list:   Multiple sclerosis exacerbation - Patient with known history of Multiple sclerosis presented with acute right side weakness, numbness, and pain in her back and right leg.  A CT of the head in the ED did not show any acute pathology.  Neurology evaluated patient and recommended started IV Methylprednisolone 1000mg  daily for 3 days.  An MRI of the brain and C spine showed stable changes associated with multiple sclerosis and no evidence of acute demyelination.  She has some slow clinical improvement working with physical therapy and occupational therapy during her stay.  She was evaluated and accepted into CIR for further rehab.   Low back and right leg pain - Likely secondary to MS exacerbation.  She was initially treated with Dilaudid and gradually weaned to oxycodone. Flexeril 5mg  TID was added for muscle spasms. She was then changed to Gabapentin to control her dysthetic pain.    GERD (gastroesophageal reflux disease) Treated with protonix     Thrush, oral   Noted on exam, HIV screen negative. Treated with Nystatin. Resolved by discharged.  Discharge Vitals:   BP 107/62  Pulse 62  Temp(Src) 97.9 F (36.6 C) (Oral)  Resp 18  Ht 5\' 4"  (1.626 m)  Wt 235 lb 3.2 oz (106.686 kg)  BMI 40.35 kg/m2  SpO2 95%  LMP 01/27/2014  Discharge Labs:  No results found for this or any previous visit (from the past 24 hour(s)).  Signed: Carlynn PurlErik Hoffman, [name] 02/14/2014, 4:52 PM   Services Ordered on Discharge: none Equipment Ordered on Discharge: none

## 2014-02-14 NOTE — Progress Notes (Signed)
Alert, orient X 3 Skin intact, no skin breakdown. Rehab rules discussed with patient . Safety care plan discussed and signed.

## 2014-02-14 NOTE — Progress Notes (Signed)
Speech Language Pathology Treatment: Dysphagia  Patient Details Name: Mary [name] MRN: [id number] DOB: 02/09/1976 Today's Date: 02/14/2014 Time: 1210-1228 SLP Time Calculation (min): 18 min  Assessment / Plan / Recommendation Clinical Impression  Pt was seen with her lunch meal, consisting of Dys 1 and 2 textures with thin liquids. Pt continues to report food "sticking in the back of her throat" with more solid textures. SLP provided Min cues to use a liquid wash, which pt reported was effective at reducing this sensation. No signs of aspiration observed. SLP encouraged pt to utilize a liquid wash and alternating solids/liquids to try more Dys 2 textures. Menu was reviewed with patient, with emphasis on what textures could be available on her current diet.    HPI HPI: Patient is a 38 year old woman with relapsing/remitting multiple sclerosis diagnosed 08/2013 by Dr. Everlena CooperJaffe and being treated with Copaxone, recurrent idiopathic pancreatitis, chronic pancreatitis with pseudocyst, and other problems as outlined in the medical history now admitted with right-sided weakness, numbness, and tingling with stuttering of speech which began one day prior to admission.  She also reports blurred vision on the right.   Pertinent Vitals n/a  SLP Plan  Continue with current plan of care    Recommendations Diet recommendations: Dysphagia 2 (fine chop);Thin liquid Liquids provided via: Straw;Cup Medication Administration: Whole meds with liquid Supervision: Intermittent supervision to cue for compensatory strategies;Patient able to self feed Compensations: Slow rate;Small sips/bites Postural Changes and/or Swallow Maneuvers: Seated upright 90 degrees;Upright 30-60 min after meal              Oral Care Recommendations: Oral care BID Follow up Recommendations: Inpatient Rehab Plan: Continue with current plan of care    GO      Maxcine HamLaura Paiewonsky, M.A. CCC-SLP [phone]  Maxcine Hamaiewonsky,  Laura 02/14/2014, 1:20 PM

## 2014-02-15 ENCOUNTER — Inpatient Hospital Stay (HOSPITAL_COMMUNITY): Payer: Medicaid Other | Admitting: Speech Pathology

## 2014-02-15 ENCOUNTER — Inpatient Hospital Stay (HOSPITAL_COMMUNITY): Payer: Self-pay | Admitting: Physical Therapy

## 2014-02-15 ENCOUNTER — Inpatient Hospital Stay (HOSPITAL_COMMUNITY): Payer: Self-pay | Admitting: Occupational Therapy

## 2014-02-15 LAB — COMPREHENSIVE METABOLIC PANEL
ALBUMIN: 3 g/dL — AB (ref 3.5–5.2)
ALK PHOS: 65 U/L (ref 39–117)
ALT: 19 U/L (ref 0–35)
AST: 11 U/L (ref 0–37)
BUN: 20 mg/dL (ref 6–23)
CHLORIDE: 102 meq/L (ref 96–112)
CO2: 25 mEq/L (ref 19–32)
Calcium: 8.3 mg/dL — ABNORMAL LOW (ref 8.4–10.5)
Creatinine, Ser: 0.56 mg/dL (ref 0.50–1.10)
GFR calc Af Amer: >90 mL/min (ref >90)
GFR calc non Af Amer: >90 mL/min (ref >90)
Glucose, Bld: 118 mg/dL — ABNORMAL HIGH (ref 70–99)
POTASSIUM: 4 meq/L (ref 3.7–5.3)
Sodium: 140 mEq/L (ref 137–147)
Total Protein: 6.9 g/dL (ref 6.0–8.3)

## 2014-02-15 LAB — CBC WITH DIFFERENTIAL/PLATELET
BASOS ABS: 0 10*3/uL (ref 0.0–0.1)
Basophils Relative: 0 % (ref 0–1)
Eosinophils Absolute: 0 10*3/uL (ref 0.0–0.7)
Eosinophils Relative: 0 % (ref 0–5)
HEMATOCRIT: 32.4 % — AB (ref 36.0–46.0)
HEMOGLOBIN: 10.4 g/dL — AB (ref 12.0–15.0)
LYMPHS PCT: 30 % (ref 12–46)
Lymphs Abs: 3.9 10*3/uL (ref 0.7–4.0)
MCH: 28 pg (ref 26.0–34.0)
MCHC: 32.1 g/dL (ref 30.0–36.0)
MCV: 87.1 fL (ref 78.0–100.0)
MONO ABS: 1.1 10*3/uL — AB (ref 0.1–1.0)
MONOS PCT: 8 % (ref 3–12)
NEUTROS ABS: 7.8 10*3/uL — AB (ref 1.7–7.7)
NEUTROS PCT: 62 % (ref 43–77)
Platelets: 286 10*3/uL (ref 150–400)
RBC: 3.72 MIL/uL — ABNORMAL LOW (ref 3.87–5.11)
RDW: 14.7 % (ref 11.5–15.5)
WBC: 12.7 10*3/uL — ABNORMAL HIGH (ref 4.0–10.5)

## 2014-02-15 MED ORDER — OXYCODONE HCL 5 MG PO TABS
5.0000 mg | ORAL_TABLET | ORAL | Status: DC | PRN
  Administered 2014-02-16 – 2014-02-23 (×38): 10 mg via ORAL
  Filled 2014-02-15 (×38): qty 2

## 2014-02-15 NOTE — Progress Notes (Signed)
Patient information reviewed and entered into eRehab system by Robbyn Hodkinson, RN, CRRN, PPS Coordinator.  Information including medical coding and functional independence measure will be reviewed and updated through discharge.    

## 2014-02-15 NOTE — Progress Notes (Signed)
Reviewed and in agreement with treatment provided.  

## 2014-02-15 NOTE — Evaluation (Signed)
Occupational Therapy Assessment and Plan  Patient Details  Name: Mary [name] MRN: [id number] Date of Birth: 08/27/1975  OT Diagnosis: acute pain, disturbance of vision, hemiplegia affecting dominant side, muscle weakness (generalized), pain in joint and swelling of limb Rehab Potential: Rehab Potential: Excellent ELOS: 7days   Today's Date: 02/15/2014 Time: 0800-0900 Time Calculation (min): 60 min  Problem List:  Patient Active Problem List   Diagnosis Date Noted  . Exacerbation of multiple sclerosis 02/14/2014  . Thrush, oral 02/12/2014  . GERD (gastroesophageal reflux disease) 02/11/2014  . Multiple sclerosis exacerbation 02/11/2014  . Relapsing remitting multiple sclerosis 09/12/2013  . Abdominal pain 07/26/2013  . Nausea and vomiting in adult 07/26/2013  . Low serum cortisol level 04/25/2013  . ? Adrenal insufficiency- Work up negative 04/23/2013  . Chronic pancreatitis with chronic pseudocysts 04/23/2013  . Abnormal MRI 04/23/2013  . Persistent vomiting 04/22/2013  . Diarrhea 04/21/2013  . Anemia 04/20/2013  . Protein-calorie malnutrition, severe 04/16/2013  . Obesity, morbid 03/15/2013  . Pancreatic cyst 03/15/2013  . history of recurrent ideopathic pancreatitis 03/15/2013    Past Medical History:  Past Medical History  Diagnosis Date  . Hernia 03-24-13    ventral hernia remains   . GERD (gastroesophageal reflux disease)   . Pancreatitis 03-24-13    2002, 2'2013, 03-14-13  . Pancreatic cyst 2002 onset  . Anxiety   . Depression     "recent breakup with partner of 15 yrs"  . Adrenal insufficiency 04/23/2013    ??  . MS (multiple sclerosis)    Past Surgical History:  Past Surgical History  Procedure Laterality Date  . Abdominal surgery  ~ 2007    some sort of pancreatic cyst drainage.   . Cholecystectomy      ~ age 51-laparoscopic  . Adenoidectomy    . 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 Eus N/A  04/14/2013    Procedure: UPPER ENDOSCOPIC ULTRASOUND (EUS) LINEAR;  Surgeon: Milus Banister, MD;  Location: [facility];  Service: Endoscopy;  Laterality: N/A;    Assessment & Plan Clinical Impression: Patient is a 38 y.o. year old female with h/o depression, chronic pancreatitis, MS diagnosed 08/2013 and on copaxone;who was admitted on 02/11/14 with numbness and weakness RLE progressing to RUE weakness and right facial numbness with stuttering speech. MRI brain stable without new lesions and MRI cervical spine without evidence of MS involvement. She was started on high dose solumedrol for MS exacerbation per neurology input and reported to have improvement in symptoms. Patient placed on liquid diet due to dicomfort with solids but willing to be advanced to dysphagia 2--SLP questions anxiety v/s esophageal phase issues.  Patient transferred to CIR on 02/14/2014 .    Patient currently requires min with basic self-care skills secondary to abnormal tone and decreased coordination.  Prior to hospitalization, patient could complete ADL/IADL with independent .  Patient will benefit from skilled intervention to increase independence with basic self-care skills and increase level of independence with iADL prior to discharge home with care partner.  Anticipate patient will require intermittent supervision and follow up outpatient.  OT - End of Session Activity Tolerance: Decreased this session;Tolerates 30+ min activity with multiple rests Endurance Deficit: Yes OT Assessment Rehab Potential: Excellent OT Patient demonstrates impairments in the following area(s): Balance;Endurance;Pain;Safety;Sensory;Vision OT Basic ADL's Functional Problem(s): Eating;Grooming;Bathing;Dressing;Toileting OT Advanced ADL's Functional Problem(s): Simple Meal Preparation OT Transfers Functional Problem(s): Toilet;Tub/Shower OT Additional Impairment(s): Fuctional Use  of Upper Extremity OT Plan OT Intensity: Minimum of 1-2  x/day, 45 to 90 minutes OT Frequency: 5 out of 7 days OT Duration/Estimated Length of Stay: 7days OT Treatment/Interventions: Balance/vestibular training;Patient/family education;Self Care/advanced ADL retraining;Splinting/orthotics;Therapeutic Exercise;UE/LE Coordination activities;Neuromuscular re-education;Functional electrical stimulation;Disease mangement/prevention;Community reintegration;Discharge planning;DME/adaptive equipment instruction;Therapeutic Activities;Psychosocial support;Pain management;Functional mobility training;UE/LE Strength taining/ROM OT Self Feeding Anticipated Outcome(s): Mod I OT Basic Self-Care Anticipated Outcome(s): Mod I OT Toileting Anticipated Outcome(s): Mod I OT Bathroom Transfers Anticipated Outcome(s): Mod I OT Recommendation Recommendations for Other Services: Neuropsych consult Patient destination: Home Follow Up Recommendations: Outpatient OT Equipment Recommended: To be determined   Skilled Therapeutic Intervention Patient seen this am for OT intervention to address basic self care skills, functional mobility, and functional use of right extremities.  Patient tearful with current situation, and agreeable to neuropsychological support to improve coping skills. Patient with active movement throughout right upper extremity yet lacks strength.    OT Evaluation Precautions/Restrictions  Precautions Precautions: Fall Precaution Comments: right leg weakness Restrictions Weight Bearing Restrictions: No General Chart Reviewed: Yes Family/Caregiver Present: No   Pain Pain Assessment Pain Assessment: 0-10 Pain Score: 8  Pain Type: Neuropathic pain Pain Location: Leg Pain Orientation: Right Pain Descriptors / Indicators: Burning;Aching Pain Onset: On-going Pain Intervention(s): Medication (See eMAR) (refusing some medications (gabapentin)) Multiple Pain Sites: No Home Living/Prior Functioning Home Living Available Help at Discharge:  Family;Friend(s);Available PRN/intermittently Type of Home: House Home Access: Ramped entrance Home Layout: One level;Able to live on main level with bedroom/bathroom  Lives With: Family;Other (Comment) IADL History Homemaking Responsibilities: Yes Occupation: Unemployed Prior Function Level of Independence: Independent with basic ADLs;Independent with homemaking with ambulation;Independent with gait;Independent with transfers  Able to Take Stairs?: Yes Driving: Yes Vocation: On disability 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 Comments: Intermittent use of cane during previous exacerbation   Vision/Perception  Vision- History Baseline Vision/History: No visual deficits (wears contacts) Patient Visual Report: Blurring of vision (right eye) Vision- Assessment Vision Assessment?: Vision impaired- to be further tested in functional context  Cognition Overall Cognitive Status: Within Functional Limits for tasks assessed Arousal/Alertness: Awake/alert Orientation Level: Oriented X4 Sensation Sensation Light Touch: Impaired by gross assessment Light Touch Impaired Details: Impaired RUE;Impaired RLE Proprioception: Appears Intact Coordination Gross Motor Movements are Fluid and Coordinated: No Fine Motor Movements are Fluid and Coordinated: No Coordination and Movement Description: dysmetria, undershoots with right UE Motor  Motor Motor: Hemiplegia;Abnormal tone;Motor impersistence Motor - Skilled Clinical Observations: movement present in all joints, but delayed and weak in RUE  Trunk/Postural Assessment  Cervical Assessment Cervical Assessment: Within Functional Limits Thoracic Assessment Thoracic Assessment: Within Functional Limits Lumbar Assessment Lumbar Assessment: Within Functional Limits Postural Control Postural Control: Within Functional Limits  Balance Balance Balance Assessed: Yes Static Sitting Balance Static Sitting - Balance Support: No upper extremity  supported;Feet supported Static Sitting - Level of Assistance: 7: Independent Dynamic Sitting Balance Dynamic Sitting - Balance Support: No upper extremity supported;Feet supported;During functional activity Dynamic Sitting - Level of Assistance: 5: Stand by assistance Static Standing Balance Static Standing - Balance Support: Bilateral upper extremity supported Static Standing - Level of Assistance: 5: Stand by assistance Dynamic Standing Balance Dynamic Standing - Balance Support: Bilateral upper extremity supported Dynamic Standing - Level of Assistance: 4: Min assist Extremity/Trunk Assessment RUE Assessment RUE Assessment:  (Right hemiparesis, 75% AROM at shoulder, delayed movement and 3+-4-/5 strength) LUE Assessment LUE Assessment: Within Functional Limits  FIM:  FIM - Grooming Grooming Steps: Wash, rinse, dry face;Wash, rinse, dry hands Grooming: 3: Patient completes 2  of 4 or 3 of 5 steps FIM - Bathing Bathing Steps Patient Completed: Chest;Right Arm;Left Arm;Abdomen;Left upper leg;Right upper leg;Front perineal area;Right lower leg (including foot);Left lower leg (including foot);Buttocks Bathing: 4: Steadying assist FIM - Upper Body Dressing/Undressing Upper body dressing/undressing steps patient completed: Thread/unthread right sleeve of pullover shirt/dresss;Thread/unthread left sleeve of pullover shirt/dress;Put head through opening of pull over shirt/dress;Pull shirt over trunk Upper body dressing/undressing: 5: Set-up assist to: Obtain clothing/put away FIM - Lower Body Dressing/Undressing Lower body dressing/undressing steps patient completed: Thread/unthread left underwear leg;Pull underwear up/down;Thread/unthread left pants leg;Pull pants up/down;Don/Doff right sock;Don/Doff left sock Lower body dressing/undressing: 4: Min-Patient completed 75 plus % of tasks FIM - Control and instrumentation engineer Devices: Copy: 4: Bed > Chair or  W/C: Min A (steadying Pt. > 75%);5: Chair or W/C > Bed: Supervision (verbal cues/safety issues) FIM - Systems developer Devices: Tub transfer bench Tub/shower Transfers: 4-Out of Tub/Shower: Min A (steadying Pt. > 75%/lift 1 leg);4-Into Tub/Shower: Min A (steadying Pt. > 75%/lift 1 leg)   Refer to Care Plan for Long Term Goals  Recommendations for other services: Neuropsych  Discharge Criteria: Patient will be discharged from OT if patient refuses treatment 3 consecutive times without medical reason, if treatment goals not met, if there is a change in medical status, if patient makes no progress towards goals or if patient is discharged from hospital.  The above assessment, treatment plan, treatment alternatives and goals were discussed and mutually agreed upon: by patient  Mariah Milling 02/15/2014, 1:16 PM

## 2014-02-15 NOTE — Evaluation (Signed)
Physical Therapy Assessment and Plan  Patient Details  Name: Mary [name] MRN: [id number] Date of Birth: 1976-01-20  PT Diagnosis: Abnormality of gait, Difficulty walking, Impaired sensation, Muscle weakness and Pain in RLE Rehab Potential: Good ELOS: 7 days   Today's Date: 02/15/2014 Time: 0900-1000 Time Calculation (min): 60 min  Problem List:  Patient Active Problem List   Diagnosis Date Noted  . Exacerbation of multiple sclerosis 02/14/2014  . Thrush, oral 02/12/2014  . GERD (gastroesophageal reflux disease) 02/11/2014  . Multiple sclerosis exacerbation 02/11/2014  . Relapsing remitting multiple sclerosis 09/12/2013  . Abdominal pain 07/26/2013  . Nausea and vomiting in adult 07/26/2013  . Low serum cortisol level 04/25/2013  . ? Adrenal insufficiency- Work up negative 04/23/2013  . Chronic pancreatitis with chronic pseudocysts 04/23/2013  . Abnormal MRI 04/23/2013  . Persistent vomiting 04/22/2013  . Diarrhea 04/21/2013  . Anemia 04/20/2013  . Protein-calorie malnutrition, severe 04/16/2013  . Obesity, morbid 03/15/2013  . Pancreatic cyst 03/15/2013  . history of recurrent ideopathic pancreatitis 03/15/2013    Past Medical History:  Past Medical History  Diagnosis Date  . Hernia 03-24-13    ventral hernia remains   . GERD (gastroesophageal reflux disease)   . Pancreatitis 03-24-13    2002, 2'2013, 03-14-13  . Pancreatic cyst 2002 onset  . Anxiety   . Depression     "recent breakup with partner of 15 yrs"  . Adrenal insufficiency 04/23/2013    ??  . MS (multiple sclerosis)    Past Surgical History:  Past Surgical History  Procedure Laterality Date  . Abdominal surgery  ~ 2007    some sort of pancreatic cyst drainage.   . Cholecystectomy      ~ age 12-laparoscopic  . Adenoidectomy    . 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 Eus N/A 04/14/2013    Procedure: UPPER ENDOSCOPIC ULTRASOUND (EUS) LINEAR;  Surgeon:  Milus Banister, MD;  Location: [facility];  Service: Endoscopy;  Laterality: N/A;    Assessment & Plan Clinical Impression: Patient is a 38 y.o. year old female with recent admission to the hospital on 02/11/14 with numbness and weakness RLE progressing to RUE weakness and right facial numbness with stuttering speech.  Patient transferred to CIR on 02/14/2014 .   Patient currently requires min with mobility secondary to muscle weakness, unbalanced muscle activation and decreased sitting balance, decreased standing balance, decreased postural control and decreased balance strategies.  Prior to hospitalization, patient was independent  with mobility and lived with Family;Other (Comment) (Stepmother) in a House home.  Home access is  Ramped entrance.  Patient will benefit from skilled PT intervention to maximize safe functional mobility, minimize fall risk and decrease caregiver burden for planned discharge home with intermittent assist.  Anticipate patient will benefit from follow up [facility] at discharge.  PT - End of Session Activity Tolerance: Tolerates 30+ min activity with multiple rests Endurance Deficit: Yes PT Assessment Rehab Potential: Good Barriers to Discharge: Decreased caregiver support PT Patient demonstrates impairments in the following area(s): Balance;Endurance;Motor;Pain;Safety;Sensory PT Transfers Functional Problem(s): Bed Mobility;Bed to Chair;Car;Furniture PT Locomotion Functional Problem(s): Ambulation;Wheelchair Mobility PT Plan PT Intensity: Minimum of 1-2 x/day ,45 to 90 minutes PT Frequency: 5 out of 7 days PT Duration Estimated Length of Stay: 7 days PT Treatment/Interventions: Ambulation/gait training;Balance/vestibular training;Discharge planning;Community reintegration;Functional mobility training;DME/adaptive equipment instruction;Neuromuscular re-education;Pain management;Functional electrical stimulation;Patient/family education;Splinting/orthotics;Therapeutic  Activities;Therapeutic Exercise;UE/LE Strength taining/ROM;UE/LE Coordination activities;Wheelchair propulsion/positioning PT Transfers  Anticipated Outcome(s): mod I PT Locomotion Anticipated Outcome(s): mod I PT Recommendation Follow Up Recommendations: Home health PT Patient destination: Home Equipment Recommended: To be determined  Skilled Therapeutic Intervention Min A for stand pivot for w/c <> bed transfer, min A for balance with sit <> stand using RW.  Ambulated 25' using RW with min A for balance, demonstrated foot drop, increased lateral weight shift to L and circumduction to advance RLE.  Pt propelled w/c 50' with min A to right path, c/o decreased grip strength on R, plan to add band to w/c rim in PM session to increase grip.  Stair navigation x 5 steps with mod A required to advance RLE.  Sidelying and supine AAROM for RLE strength, see below for strength grades.  Supine D1 LE PNF, resistance with LLE, rhythmic initiation and active assist with RLE.  Min A for rolling <> sidelying <> sit, required A for RLE for sit > sidelying.  Pt left supine in bed, HOB elevated, all needs met and in reach.  PT Evaluation Precautions/Restrictions Precautions Precautions: Fall Restrictions Weight Bearing Restrictions: No Pain Pt c/o "nerve" pain throughout RLE, RN aware; c/o muscular pain in back with ambulation, rest as appropriate. Home Living/Prior Functioning Home Living Available Help at Discharge: Family;Friend(s);Available PRN/intermittently (Stepmother and friend split time for assistance) Type of Home: House Home Access: Ramped entrance Home Layout: One level;Able to live on main level with bedroom/bathroom  Lives With: Family;Other (Comment) (Stepmother) Prior Function Level of Independence: Independent with basic ADLs;Independent with homemaking with ambulation;Independent with gait;Independent with transfers  Able to Take Stairs?: Yes Driving: Yes Vocation: On  disability Sensation Sensation Light Touch: Impaired Detail Light Touch Impaired Details: Impaired RUE;Impaired RLE Proprioception: Appears Intact Coordination Gross Motor Movements are Fluid and Coordinated: Yes (LLE coordinated, movement absent in RLE) Motor  Motor Motor: Other (comment) (gross weakness in RLE)  Trunk/Postural Assessment  Cervical Assessment Cervical Assessment: Within Functional Limits Thoracic Assessment Thoracic Assessment: Within Functional Limits Lumbar Assessment Lumbar Assessment: Within Functional Limits Postural Control Postural Control: Within Functional Limits  Balance Balance Balance Assessed: Yes Static Sitting Balance Static Sitting - Balance Support: Right upper extremity supported;Feet supported Static Sitting - Level of Assistance: 6: Modified independent (Device/Increase time) Dynamic Sitting Balance Dynamic Sitting - Balance Support: Right upper extremity supported;Feet supported Dynamic Sitting - Level of Assistance: 5: Stand by assistance Static Standing Balance Static Standing - Balance Support: Bilateral upper extremity supported Static Standing - Level of Assistance: 5: Stand by assistance Dynamic Standing Balance Dynamic Standing - Balance Support: Bilateral upper extremity supported Dynamic Standing - Level of Assistance: 4: Min assist Extremity Assessment      RLE Assessment RLE Assessment: Exceptions to [facility] RLE Strength RLE Overall Strength Comments: WFL: toe flex and ext; 2-/5: hip ext, knee flex; 1/5: hip abd, hip ER, ankle PF; 0/5: ankle DF, knee ext LLE Assessment LLE Assessment: Within Functional Limits  FIM:  FIM - Control and instrumentation engineer Devices: Copy: 4: Supine > Sit: Min A (steadying Pt. > 75%/lift 1 leg);4: Sit > Supine: Min A (steadying pt. > 75%/lift 1 leg);4: Bed > Chair or W/C: Min A (steadying Pt. > 75%);4: Chair or W/C > Bed: Min A (steadying Pt. > 75%) FIM -  Locomotion: Wheelchair Locomotion: Wheelchair: 2: Travels 50 - 149 ft with minimal assistance (Pt.>75%) FIM - Locomotion: Ambulation Locomotion: Ambulation Assistive Devices: Administrator Ambulation/Gait Assistance: 4: Min assist Locomotion: Ambulation: 1: Travels less than 50 ft with minimal assistance (  Pt.>75%) FIM - Locomotion: Stairs Locomotion: Scientist, physiological: Hand rail - 2 Locomotion: Stairs: 2: Up and Down 4 - 11 stairs with moderate assistance (Pt: 50 - 74%)   Refer to Care Plan for Long Term Goals  Recommendations for other services: None  Discharge Criteria: Patient will be discharged from PT if patient refuses treatment 3 consecutive times without medical reason, if treatment goals not met, if there is a change in medical status, if patient makes no progress towards goals or if patient is discharged from hospital.  The above assessment, treatment plan, treatment alternatives and goals were discussed and mutually agreed upon: by patient  Kenn File 02/15/2014, 10:24 AM

## 2014-02-15 NOTE — H&P (Signed)
Physical Medicine and Rehabilitation Admission H&P      Chief Complaint   Patient presents with   .  Weakness due to MS exacerbation.       HPI:  Mary [name] is a 38 y.o. female with h/o depression, chronic pancreatitis, MS diagnosed 08/2013 and on copaxone;who was admitted on 02/11/14 with numbness and weakness RLE progressing to RUE weakness and right facial numbness with stuttering speech. MRI brain stable without new lesions and MRI cervical spine without evidence of MS involvement. She was started on high dose solumedrol for MS exacerbation per neurology input and reported to have improvement in symptoms. Patient placed on liquid diet due to dicomfort with solids but willing to be advanced to dysphagia 2--SLP questions anxiety v/s esophageal phase issues. Therapy initiated and CIR recommended by rehab team.   Constipated Bladder urgency   Review of Systems  HENT: Negative for hearing loss.   Eyes: Positive for blurred vision (on right). Negative for double vision.  Respiratory: Negative for cough, shortness of breath and wheezing.   Cardiovascular: Positive for chest pain (feels like a tight hug around the chest).  Gastrointestinal: Negative for heartburn, nausea, abdominal pain and constipation.  Genitourinary: Negative for dysuria, urgency and frequency.  Musculoskeletal: Positive for myalgias. Negative for joint pain.  Neurological: Positive for tingling, sensory change (burning on right side) and focal weakness. Negative for dizziness and headaches.  Psychiatric/Behavioral: The patient has insomnia.      Past Medical History   Diagnosis  Date   .  Hernia  03-24-13       ventral hernia remains    .  GERD (gastroesophageal reflux disease)     .  Pancreatitis  03-24-13       2002, 2'2013, 03-14-13   .  Pancreatic cyst  2002 onset   .  Anxiety     .  Depression         "recent breakup with partner of 15 yrs"   .  Adrenal insufficiency  04/23/2013       ??   .  MS (multiple  sclerosis)      Past Surgical History   Procedure  Laterality  Date   .  Abdominal surgery    ~ 2007       some sort of pancreatic cyst drainage.    .  Cholecystectomy           ~ age 78-laparoscopic   .  Adenoidectomy       .  Roux-en-y procedure           stent to pancreatic cyst that became infected within 36 hours   .  Eus  N/A  04/14/2013       Procedure: UPPER ENDOSCOPIC ULTRASOUND (EUS) LINEAR;  Surgeon: Milus Banister, MD;  Location: [facility];  Service: Endoscopy;  Laterality: N/A;    Family History   Problem  Relation  Age of Onset   .  Lupus  Neg Hx     .  Sarcoidosis  Neg Hx     .  Pancreatitis  Neg Hx     .  Ataxia  Neg Hx     .  Chorea  Neg Hx     .  Dementia  Neg Hx     .  Mental retardation  Neg Hx     .  Migraines  Neg Hx     .  Multiple sclerosis  Neg Hx     .  Neurofibromatosis  Neg Hx     .  Neuropathy  Neg Hx     .  Parkinsonism  Neg Hx     .  Seizures  Neg Hx     .  Stroke  Neg Hx     .  Colitis  Maternal Aunt     .  Diabetes  Mother     .  Diabetes  Father     .  Diabetes           many family members    Social History: Lives with mother. Has been unemployed since last July (returned from [place]) and applied for disability. She reports that she quit smoking about 8 months ago. Her smoking use included Cigarettes. She has a 10 pack-year smoking history. She has never used smokeless tobacco. She reports that she does not drink alcohol or use illicit drugs.     Allergies   Allergen  Reactions   .  Amoxicillin  Anaphylaxis   .  Penicillins  Anaphylaxis   .  Latex  Itching   .  Morphine And Related  Nausea And Vomiting    Medications Prior to Admission   Medication  Sig  Dispense  Refill   .  b complex vitamins tablet  Take 1 tablet by mouth daily.         .  cholecalciferol (VITAMIN D) 1000 UNITS tablet  Take 5,000 Units by mouth daily.         Marland Kitchen  Glatiramer Acetate (COPAXONE) 40 MG/ML SOSY  Inject 1 Syringe into the skin 3 (three) times a  week.   12 Syringe   0   .  lipase/protease/amylase (CREON-12/PANCREASE) 12000 UNITS CPEP capsule  Take 2 capsules by mouth 3 (three) times daily with meals.   180 capsule   2   .  oxyCODONE (OXY IR/ROXICODONE) 5 MG immediate release tablet  Take 5 mg by mouth every 4 (four) hours as needed for severe pain.         .  pantoprazole (PROTONIX) 40 MG tablet  Take 40 mg by mouth daily.         .  promethazine (PHENERGAN) 25 MG tablet  Take 1 tablet (25 mg total) by mouth every 6 (six) hours as needed for nausea or vomiting.   15 tablet   0      Home: Home Living Family/patient expects to be discharged to:: Private residence Living Arrangements: Other relatives (Mother in-law) Available Help at Discharge: Family;Available 24 hours/day Type of Home: House Home Access: Ramped entrance Home Layout: One level Home Equipment: Cane - single point;Grab bars - tub/shower (tall toilets)    Functional History: Prior Function Level of Independence: Independent with assistive device(s) Comments: Intermittent use of cane during previous exacerbation   Functional Status:   Mobility: Bed Mobility Overal bed mobility: Modified Independent Bed Mobility: Supine to Sit Supine to sit: Min guard;HOB elevated General bed mobility comments: Mod I due to slow speed of movement Transfers Overall transfer level: Needs assistance Equipment used: Rolling walker (2 wheeled) Transfers: Sit to/from Stand Sit to Stand: Min guard General transfer comment: Min guard assist with RW.  Pt pushing up from sitting surface appropriately, verbal cues for safe hand placement during transition to sit Ambulation/Gait Ambulation/Gait assistance: Min guard Ambulation Distance (Feet): 75 Feet Assistive device: Rolling walker (2 wheeled) Gait Pattern/deviations: Step-through pattern;Decreased step length - right;Decreased stance time - right;Decreased dorsiflexion - right;Decreased weight shift to right;Trunk flexed Gait  velocity: decreased Gait velocity interpretation: Below normal speed for age/gender General Gait Details: Pt has inconsistant gait pattern vs. MMT.  Trace ankle DF, 2+/5 knee extension, however, with gait her only observed deficit is the foot drag.  She does not buckle over the right knee or snap it back into hyperextension with stance.  She slowly progresses the hip forward, but there is no trendelenberg pattern.  She shows 5/5 strength in the right upper extremity because she is bearing weight equally on bil arms to unweight her leg during gait.  Flexed posture.  Verbal cues for safe use of RW and upright posture.    ADL:   Cognition: Cognition Overall Cognitive Status: Within Functional Limits for tasks assessed Orientation Level: Oriented X4 Cognition Arousal/Alertness: Awake/alert Behavior During Therapy: Anxious (very analytical) Overall Cognitive Status: Within Functional Limits for tasks assessed   Physical Exam: Blood pressure 107/62, pulse 62, temperature 97.9 F (36.6 C), temperature source Oral, resp. rate 18, height 5' 4"  (1.626 m), weight 106.686 kg (235 lb 3.2 oz), last menstrual period 01/27/2014, SpO2 95.00%. Physical Exam  Nursing note and vitals reviewed. Constitutional: She is oriented to person, place, and time. She appears well-developed and well-nourished.  Morbidly obese  HENT:   Head: Normocephalic and atraumatic.  Eyes: Pupils are equal, round, and reactive to light.  Neck: Neck supple.  Cardiovascular: Normal rate and regular rhythm.   Respiratory: Effort normal and breath sounds normal. No respiratory distress. She has no wheezes.  GI: Soft. Bowel sounds are normal. She exhibits no distension. There is no tenderness.  Musculoskeletal: She exhibits no edema and no tenderness.  Neurological: She is alert and oriented to person, place, and time.  Speech clear today. Able to follow commands without difficulty.  Motor Strength --RUE 3-/5,RLE 2- HF, KE, trace  ADF/PF. 5/5 on Left side. Sensory mildly diminished RLE  Skin: Skin is warm and dry.  Psychiatric: Thought content normal. Her affect is blunt. She is slowed. Cognition and memory are normal.     Results for orders placed during the hospital encounter of 02/11/14 (from the past 48 hour(s))   BASIC METABOLIC PANEL     Status: Abnormal     Collection Time      02/13/14  5:31 AM       Result  Value  Ref Range     Sodium  134 (*)  137 - 147 mEq/L     Potassium  6.3 (*)  3.7 - 5.3 mEq/L     Comment:  HEMOLYSIS AT THIS LEVEL MAY AFFECT RESULT        DELTA CHECK NOTED     Chloride  101   96 - 112 mEq/L     CO2  18 (*)  19 - 32 mEq/L     Glucose, Bld  173 (*)  70 - 99 mg/dL     BUN  14   6 - 23 mg/dL     Creatinine, Ser  0.43 (*)  0.50 - 1.10 mg/dL     Calcium  8.7   8.4 - 10.5 mg/dL     GFR calc non Af Amer  >90   >90 mL/min     GFR calc Af Amer  >90   >90 mL/min     Comment:  (NOTE)        The eGFR has been calculated using the CKD EPI equation.        This calculation has not been validated in all clinical situations.  eGFR's persistently <90 mL/min signify possible Chronic Kidney        Disease.   BASIC METABOLIC PANEL     Status: Abnormal     Collection Time      02/13/14  7:49 AM       Result  Value  Ref Range     Sodium  137   137 - 147 mEq/L     Potassium  4.5   3.7 - 5.3 mEq/L     Chloride  99   96 - 112 mEq/L     CO2  24   19 - 32 mEq/L     Glucose, Bld  168 (*)  70 - 99 mg/dL     BUN  14   6 - 23 mg/dL     Creatinine, Ser  0.46 (*)  0.50 - 1.10 mg/dL     Calcium  9.0   8.4 - 10.5 mg/dL     GFR calc non Af Amer  >90   >90 mL/min     GFR calc Af Amer  >90   >90 mL/min     Comment:  (NOTE)        The eGFR has been calculated using the CKD EPI equation.        This calculation has not been validated in all clinical situations.        eGFR's persistently <90 mL/min signify possible Chronic Kidney        Disease.    Mr Jeri Cos Wo Contrast   02/12/2014   CLINICAL  DATA:  38 year old female with newly diagnosed relapsing remitting multiple sclerosis. Right side numbness tingling pain in weakness x1 day. Initial encounter. History of chronic pancreatitis.  EXAM: MRI HEAD WITHOUT AND WITH CONTRAST  TECHNIQUE: Multiplanar, multiecho pulse sequences of the brain and surrounding structures were obtained without and with intravenous contrast.  CONTRAST:  58m MULTIHANCE GADOBENATE DIMEGLUMINE 529 MG/ML IV SOLN in conjunction with contrast enhanced imaging of the cervical spine reported separately.  COMPARISON:  Brain MRI 09/05/2013, 04/22/2013.  FINDINGS: Stable cerebral volume. Widespread abnormal cerebral white matter T2 and FLAIR hyperintensity re- identified. Confluent involvement of the pons again noted. Mild involvement in the right cerebellar white matter again noted. Comparing sagittal FLAIR imaging, the extent of disease is stable since 04/22/2013. No lesions with restricted diffusion. No enhancing lesions.  Underlying normal cerebral volume. No restricted diffusion to suggest acute infarction. No midline shift, mass effect, evidence of mass lesion, ventriculomegaly, extra-axial collection or acute intracranial hemorrhage. Cervicomedullary junction and pituitary are within normal limits. Major intracranial vascular flow voids are stable.  Cervical spine reported separately.  No new signal abnormality identified. Deep gray matter nuclei and medulla remain spared. No cortical encephalomalacia.  Visible internal auditory structures appear normal. Visualized paranasal sinuses and mastoids are clear. Grossly normal orbits soft tissues. Normal bone marrow signal. Visualized scalp soft tissues are within normal limits.  IMPRESSION: 1. Abnormal cerebral white matter, cerebellar white matter, and pontine signal abnormality compatible with multiple sclerosis is stable since 04/22/2013. No new lesions and no evidence of acute demyelination identified. 2. No new intracranial  abnormality. 3. Cervical spine MRI today reported separately.   Electronically Signed   By: LLars PinksM.D.   On: 02/12/2014 15:21    Mr Cervical Spine W Wo Contrast   02/12/2014   CLINICAL DATA:  Multiple sclerosis.  EXAM: MRI CERVICAL SPINE WITHOUT AND WITH CONTRAST  TECHNIQUE: Multiplanar and multiecho pulse sequences of the cervical spine,  to include the craniocervical junction and cervicothoracic junction, were obtained according to standard protocol without and with intravenous contrast.  CONTRAST:  47m MULTIHANCE GADOBENATE DIMEGLUMINE 529 MG/ML IV SOLN  COMPARISON:  09/05/2013.  FINDINGS: Alignment: Mild levoconvex torticollis.  No spondylolisthesis.  Vertebrae: Normal. Marrow signal in the visualized skull sub appears normal.  Cord: Normal. No intramedullary lesions, atrophy, or edema. No myelomalacia at to suggest prior episodes of demyelinating disease.  Posterior Fossa: Deferred to MRI brain today.  Vertebral Arteries: Flow voids present bilaterally.  Paraspinal tissues: Normal.  Disc levels:  C2-C3:  Negative.  C3-C4:  Minimal bulge.  No stenosis.  C4-C5:  Minimal bulge.  No stenosis.  C5-C6:  Negative.  C6-C7:  Negative.  C7-T1: Negative. Visualized upper thoracic levels also appear normal.  After gadolinium administration, there is no pathologic enhancement.  IMPRESSION: Normal MRI of the cervical spine with and without contrast. No change from prior. No evidence of multiple sclerosis in the cervical spine.   Electronically Signed   By: GDereck LigasM.D.   On: 02/12/2014 15:00           Medical Problem List and Plan: 1. Functional deficits secondary to MS exacerbation.   2.  DVT Prophylaxis/Anticoagulation: Pharmaceutical: Lovenox 3. Pain Management:  She does not want to use neurontin to help with dysesthesias. Will schedule ultram.  Oxycodone prn for severe pain.   4. Anxiety/depression/Mood: team to provide ego support. LCSW to follow for evaluation and support.   5.  Neuropsych: This patient is  capable of making decisions on her own behalf. 6. Dysphagia/Throat pain: Will continue protonix. Continue modified diet.          Post Admission Physician Evaluation: Functional deficits secondary  to MS exacerbation. Patient is admitted to receive collaborative, interdisciplinary care between the physiatrist, rehab nursing staff, and therapy team. Patient's level of medical complexity and substantial therapy needs in context of that medical necessity cannot be provided at a lesser intensity of care such as a SNF. Patient has experienced substantial functional loss from his/her baseline which was documented above under the "Functional History" and "Functional Status" headings.  Judging by the patient's diagnosis, physical exam, and functional history, the patient has potential for functional progress which will result in measurable gains while on inpatient rehab.  These gains will be of substantial and practical use upon discharge  in facilitating mobility and self-care at the household level. Physiatrist will provide 24 hour management of medical needs as well as oversight of the therapy plan/treatment and provide guidance as appropriate regarding the interaction of the two. 24 hour rehab nursing will assist with bladder management, bowel management, safety, skin/wound care, disease management, medication administration, pain management and patient education  and help integrate therapy concepts, techniques,education, etc. PT will assess and treat for/with: pre gait, gait training, endurance , safety, equipment, neuromuscular re education,balance.   Goals are: Sup/ModI. OT will assess and treat for/with: ADLs, Cognitive perceptual skills, Neuromuscular re education, safety, endurance, equipment, balance .   Goals are: Sup/Mod I. SLP will assess and treat for/with: neurogenic stuttering.  Goals are: 100% speech intell. Case Management and Social Worker will assess and  treat for psychological issues and discharge planning. Team conference will be held weekly to assess progress toward goals and to determine barriers to discharge. Patient will receive at least 3 hours of therapy per day at least 5 days per week. ELOS: 7d        Prognosis:  excellent   AMitzi Hansen  Eulogio Ditch M.D. [facility] FAAPM&R (Sports Med, Neuromuscular Med) Diplomate Am Board of Electrodiagnostic Med  02/14/2014

## 2014-02-15 NOTE — Discharge Summary (Signed)
I discussed the care of this patient with my IM team. I have reviewed the documentation by Dr Mikey BussingHoffman and I agree with it.

## 2014-02-15 NOTE — Evaluation (Signed)
Reviewed and in agreement with evaluation and treatment provided.  

## 2014-02-15 NOTE — Evaluation (Signed)
Speech Language Pathology Assessment and Plan  Patient Details  Name: Mary [name] MRN: [id number] Date of Birth: [birth year]  SLP Diagnosis: Cognitive Impairments;Speech and Language deficits;Dysphagia  Rehab Potential: Excellent ELOS: 7 days    Today's Date: 02/15/2014 Time: 2202-5427 Time Calculation (min): 60 min  Problem List:  Patient Active Problem List   Diagnosis Date Noted  . Exacerbation of multiple sclerosis 02/14/2014  . Thrush, oral 02/12/2014  . GERD (gastroesophageal reflux disease) 02/11/2014  . Multiple sclerosis exacerbation 02/11/2014  . Relapsing remitting multiple sclerosis 09/12/2013  . Abdominal pain 07/26/2013  . Nausea and vomiting in adult 07/26/2013  . Low serum cortisol level 04/25/2013  . ? Adrenal insufficiency- Work up negative 04/23/2013  . Chronic pancreatitis with chronic pseudocysts 04/23/2013  . Abnormal MRI 04/23/2013  . Persistent vomiting 04/22/2013  . Diarrhea 04/21/2013  . Anemia 04/20/2013  . Protein-calorie malnutrition, severe 04/16/2013  . Obesity, morbid 03/15/2013  . Pancreatic cyst 03/15/2013  . history of recurrent ideopathic pancreatitis 03/15/2013   Past Medical History:  Past Medical History  Diagnosis Date  . Hernia 03-24-13    ventral hernia remains   . GERD (gastroesophageal reflux disease)   . Pancreatitis 03-24-13    2002, 2'2013, 03-14-13  . Pancreatic cyst 2002 onset  . Anxiety   . Depression     "recent breakup with partner of 15 yrs"  . Adrenal insufficiency 04/23/2013    ??  . MS (multiple sclerosis)    Past Surgical History:  Past Surgical History  Procedure Laterality Date  . Abdominal surgery  ~ 2007    some sort of pancreatic cyst drainage.   . Cholecystectomy      ~ age 70-laparoscopic  . Adenoidectomy    . 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 Eus N/A 04/14/2013    Procedure: UPPER ENDOSCOPIC ULTRASOUND (EUS) LINEAR;  Surgeon: Milus Banister,  MD;  Location: [facility];  Service: Endoscopy;  Laterality: N/A;    Assessment / Plan / Recommendation Clinical Impression Patient is a 38 y.o. female with h/o depression, chronic pancreatitis, MS diagnosed 08/2013 and on copaxone;who was admitted on 02/11/14 with numbness and weakness RLE progressing to RUE weakness and right facial numbness with stuttering speech. MRI brain stable without new lesions and MRI cervical spine without evidence of MS involvement. She was started on high dose solumedrol for MS exacerbation per neurology input and reported to have improvement in symptoms. Therapy initiated and CIR recommended by rehab team and pt admitted on 02/14/14. Pt presents with mild cognitive impairments characterized by impaired recall of new information. Pt also demonstrates a moderate stutter which she reports is new since current exacerbation. Pt was administered a BSE and did not demonstrate any overt s/s of aspiration with thin liquids via straw or Dys. 2 textures, however, pt utilized multiple swallows and liquid washes to clear reported residuals with a constant globus sensation. Pt declined trials of Dys. 3 textures due to anxiety and fear of "choking." Pt would benefit from skilled SLP intervention to maximize cognitive, swallowing and speech function in order to maximize her overall functional independence prior to discharge.   Skilled Therapeutic Interventions          Administered a cognitive-linguistic evaluation and BSE. Please see above for details.    SLP Assessment  Patient will need skilled Speech Lanaguage Pathology Services during CIR admission    Recommendations  Diet Recommendations: Thin liquid;Dysphagia 2 (  Fine chop) Liquid Administration via: Cup;Straw Medication Administration: Whole meds with liquid Supervision: Intermittent supervision to cue for compensatory strategies;Patient able to self feed Compensations: Slow rate;Small sips/bites;Follow solids with liquid;Multiple  dry swallows after each bite/sip Postural Changes and/or Swallow Maneuvers: Seated upright 90 degrees;Upright 30-60 min after meal Oral Care Recommendations: Oral care BID Recommendations for Other Services: Neuropsych consult Patient destination: Home Follow up Recommendations:  (TBD) Equipment Recommended: None recommended by SLP    SLP Frequency 5 out of 7 days   SLP Treatment/Interventions Cognitive remediation/compensation;Cueing hierarchy;Dysphagia/aspiration precaution training;Functional tasks;Environmental controls;Internal/external aids;Speech/Language facilitation;Patient/family education;Therapeutic Activities;Therapeutic Exercise    Pain Pain Assessment Pain Assessment: 0-10 Pain Score: 8  Pain Type: Neuropathic pain Pain Location: Leg Pain Orientation: Right Pain Descriptors / Indicators: Burning;Aching Pain Onset: On-going Pain Intervention(s): Medication (See eMAR) (refusing some medications (gabapentin)) Multiple Pain Sites: No Prior Functioning Type of Home: House  Lives With: Family;Other (Comment) Available Help at Discharge: Family;Friend(s);Available PRN/intermittently Vocation: On disability  Short Term Goals: Week 1: SLP Short Term Goal 1 (Week 1): Pt will utilize external memory aids to recall new, daily information with Mod I.  SLP Short Term Goal 2 (Week 1): Pt will use compensatory strategies to facilitate improved speech fluency at the conversation level (easy onset) with Mod I.  SLP Short Term Goal 3 (Week 1): Pt will utilize compensatory strategies to decrease globus sensation (alternate solids/liquids, small bites/sips) with currnet diet with Mod I.  See FIM for current functional status Refer to Care Plan for Long Term Goals  Recommendations for other services: Neuropsych  Discharge Criteria: Patient will be discharged from SLP if patient refuses treatment 3 consecutive times without medical reason, if treatment goals not met, if there is a  change in medical status, if patient makes no progress towards goals or if patient is discharged from hospital.  The above assessment, treatment plan, treatment alternatives and goals were discussed and mutually agreed upon: by patient  Namiko Pritts 02/15/2014, 3:55 PM

## 2014-02-15 NOTE — Progress Notes (Signed)
Physical Therapy Note  Patient Details  Name: Mary [name] MRN: [id number] Date of Birth: 11-17-1975 Today's Date: 02/15/2014  Time: 1300 - 1330 30 minutes  1:1, c/o pain in RLE with mobility, rest as appropriate.  Sit <> stand from bed and w/c with supervision.  Pt ambulated in room for bathroom, sit <> stand from [facility] with supervision.  Theraband tied around ankle/knee/hip to facilitate DF, knee flexion, and hip flexion for RLE clearance during ambulation.  Pt ambulated 35' using RW, noted increased DF and ease during RLE swing phase.  Ambulated 27' with B handhold A, noted increased pain in RLE, needed facilitation for weight shifts.  Alyson Ingles 02/15/2014, 2:27 PM

## 2014-02-16 ENCOUNTER — Inpatient Hospital Stay (HOSPITAL_COMMUNITY): Payer: Self-pay

## 2014-02-16 ENCOUNTER — Inpatient Hospital Stay (HOSPITAL_COMMUNITY): Payer: Self-pay | Admitting: Speech Pathology

## 2014-02-16 ENCOUNTER — Inpatient Hospital Stay (HOSPITAL_COMMUNITY): Payer: Medicaid Other

## 2014-02-16 DIAGNOSIS — G35 Multiple sclerosis: Secondary | ICD-10-CM

## 2014-02-16 MED ORDER — SORBITOL 70 % SOLN: 45.0000 mL | Freq: Once | Status: DC

## 2014-02-16 MED ORDER — TRIAMCINOLONE 0.1 % CREAM:EUCERIN CREAM 1:1
TOPICAL_CREAM | Freq: Three times a day (TID) | CUTANEOUS | Status: DC
  Administered 2014-02-16 – 2014-02-22 (×18): via TOPICAL
  Administered 2014-02-22: 1 via TOPICAL
  Administered 2014-02-23: 08:00:00 via TOPICAL
  Filled 2014-02-16: qty 1

## 2014-02-16 MED ORDER — GABAPENTIN 100 MG PO CAPS: 100.0000 mg | ORAL_CAPSULE | Freq: Two times a day (BID) | ORAL | Status: DC

## 2014-02-16 MED ORDER — NAPHAZOLINE HCL 0.1 % OP SOLN
1.0000 [drp] | Freq: Three times a day (TID) | OPHTHALMIC | Status: DC
  Administered 2014-02-16 – 2014-02-23 (×23): 1 [drp] via OPHTHALMIC
  Filled 2014-02-16 (×2): qty 15

## 2014-02-16 NOTE — Care Management Note (Signed)
[facility] Individual Statement of Services  Patient Name:  SHANDA YUSKO  Date:  02/16/2014  Welcome [facility] the [facility].  Our goal is to provide you with an individualized program based on your diagnosis and situation, designed to meet your specific needs.  With this comprehensive rehabilitation program, you will be expected to participate in at least 3 hours of rehabilitation therapies Monday-Friday, with modified therapy programming on the weekends.  Your rehabilitation program will include the following services:  Physical Therapy (PT), Occupational Therapy (OT), Speech Therapy (ST), 24 hour per day rehabilitation nursing, Therapeutic Recreaction (TR), Psychology, Case Management (Social Worker), Rehabilitation Medicine, Nutrition Services and Pharmacy Services  Weekly team conferences will be held on Tuesdays to discuss your progress.  Your Social Worker will talk with you frequently to get your input and to update you on team discussions.  Team conferences with you and your family in attendance may also be held.  Expected length of stay: 7 days  Overall anticipated outcome: modified independent  Depending on your progress and recovery, your program may change. Your Social Worker will coordinate services and will keep you informed of any changes. Your Social Worker's name and contact numbers are listed  below.  The following services may also be recommended but are not provided by the [facility]:   Driving Evaluations  Home Health Rehabiltiation Services  Outpatient Rehabilitation Services  Vocational Rehabilitation   Arrangements will be made to provide these services after discharge if needed.  Arrangements include referral to agencies that provide these services.  Your insurance has been verified to be:  None (medicaid application to be started) Your primary doctor is:  None  Pertinent information will be shared with  your doctor and your insurance company.  Social Worker:  [place], [place] [phone] or ([phone]   Information discussed with and copy given to patient by: Amada Jupiter, 02/16/2014, 3:43 PM

## 2014-02-16 NOTE — Progress Notes (Signed)
I was present for the duration of this treatment and agree with the content of this documentation. Hosie Spangle, PT, DPT

## 2014-02-16 NOTE — Progress Notes (Signed)
Speech Language Pathology Daily Session Note  Patient Details  Name: Mary [name] MRN: [id number] Date of Birth: 05/02/1976  Today's Date: 02/16/2014 Time: 0630-1601 Time Calculation (min): 42 min  Short Term Goals: Week 1: SLP Short Term Goal 1 (Week 1): Pt will utilize external memory aids to recall new, daily information with Mod I.  SLP Short Term Goal 2 (Week 1): Pt will use compensatory strategies to facilitate improved speech fluency at the conversation level (easy onset) with Mod I.  SLP Short Term Goal 3 (Week 1): Pt will utilize compensatory strategies to decrease globus sensation (alternate solids/liquids, small bites/sips) with currnet diet with Mod I.  Skilled Therapeutic Interventions:  Pt was seen for skilled speech therapy targeting education related to compensatory strategies for fluency.  During functional conversations, pt was noted with a relaxed pattern of dysfluency characterized by part and whole word repetitions with no associated secondary characteristics or tension.  Pt was 100% independent for communicating her needs and wants to both the SLP and other skilled caregivers but verbalized frustration, anxiety, and becoming embarrassed by the way her speech sounds.  SLP initiated skilled education related to resonant voice therapy techniques to facilitate easy onset of speech sounds and re-establish fluent speech patterns.  Prior to initiation of fluency exercises, SLP demonstrated gentle stretching techniques to ease tension in the neck and shoulders with 100% independent return demonstration.  SLP facilitated session with humming and chanting exercises during which the pt was 100% fluent for production of CV syllables.  Pt was approximately 75% fluent during targeted phrase repetition tasks, which improved to approximately 90% fluent following repetition of trials and periods of purposeful stuttering to reduce tension during moments of stuttering.  Pt also demonstrated  improved fluency (>75% fluent) when singing basic, familiar songs in unison with SLP.  Continue per current plan of care.    FIM:  Comprehension Comprehension Mode: Auditory Comprehension: 7-Follows complex conversation/direction: With no assist Expression Expression Mode: Verbal Expression: 5-Expresses basic needs/ideas: With extra time/assistive device Social Interaction Social Interaction: 6-Interacts appropriately with others with medication or extra time (anti-anxiety, antidepressant). Problem Solving Problem Solving: 6-Solves complex problems: With extra time Memory Memory: 5-Recognizes or recalls 90% of the time/requires cueing < 10% of the time FIM - Eating Eating Activity: 7: Complete independence:no helper  Pain Pain Assessment Pain Assessment: No/denies pain  Therapy/Group: Individual Therapy  Jackalyn Lombard, M.A. CCC-SLP  Shiryl Ruddy, Melanee Spry 02/16/2014, 3:16 PM

## 2014-02-16 NOTE — Progress Notes (Signed)
Physical Therapy Session Note  Patient Details  Name: Mary [name] MRN: [id number] Date of Birth: [birth year]  Today's Date: 02/16/2014 Time: 1340-1415 Time Calculation (min): 35 min  Short Term Goals: Week 1:     Skilled Therapeutic Interventions/Progress Updates:    Pt received supine in bed, agreeable to participate in therapy. Session focused on increased RLE muscle activation, endurance, and functional mobility. Pt able to don L shoe w/ supervision and R shoe w/ ModA. Pt w/ SST bed > w/c w/ CGA, then propelled w/c 200' to gym w/ supervision. Pt completed 8 minutes on Nustep L2 for reciprocal movement and increased activation of RLE. Pt benefited from using LE only w/ therapist controlling pace via nustep arms. Pt displayed increased activation and decreased tone in RLE with pace increased beyond her self-selected pace (20 steps per minute vs. 8). Pt ambulated 100' w/ RW w/ CGA after exercises, limited by fatigue and neuropathic pain in RLE. Pt expressed need for bathroom, left in care of NT propelling to bathroom.  Therapy Documentation Precautions:  Precautions Precautions: Fall Precaution Comments: right leg weakness Restrictions Weight Bearing Restrictions: No General: Amount of Missed PT Time (min): 15 Minutes Missed Time Reason: Other (comment) (Eating breakfast) Pain: Pain Assessment Pain Assessment: 0-10 Pain Score: 3  Pain Type: Neuropathic pain Pain Location: Leg Pain Orientation: Right Pain Descriptors / Indicators: Shooting Pain Onset: With Activity Patients Stated Pain Goal: 0 Pain Intervention(s): Rest Locomotion : Ambulation Ambulation/Gait Assistance: 4: Min assist   See FIM for current functional status  Therapy/Group: Individual Therapy  Hosie SpangleGodfrey, Zen Felling 02/16/2014, 2:37 PM

## 2014-02-16 NOTE — Progress Notes (Signed)
Occupational Therapy Session Note  Patient Details  Name: Mary [name] MRN: [id number] Date of Birth: 09-08-1975  Today's Date: 02/16/2014 Time: 0728-0828 Time Calculation (min): 60 min  Short Term Goals: Week 1:  OT Short Term Goal 1 (Week 1): STG=LTG due to short ELOS  Skilled Therapeutic Interventions/Progress Updates: ADL-retraining with focus on adapted bathing/dressing skills, transfer safety, effective use of DME (BSC over toilet and tub bench), AE skills (long sponge), and general endurance.   Patient received supine in bed but receptive to treatment after reorientation to task with respect to patient's personal modesty.   With bed rail lowered, HOB elevated, pt completed roll to left and rose to sitting unassisted, using left leg to manage right.   Patient ambulated to toilet using RW, completed transfer to [facility] over toilet, toileted and proceeded to shower level bathing at tub bench, using grab bar and RW.  Patient performed bathing sitting and standing w/o assist, using long sponge to reach feet and back.   Patient returned to toilet again, dressed sitting on BSC and resumed toileting.   Patient admitted to inhibition d/t female therapist but demonstrated no evidence of LOB or instability with good awareness of RLE weakness and need for use of DME for safety.   Patient completed all ADL except donning socks/shoes d/t preference to self-feed with remaining time.     Therapy Documentation Precautions:  Precautions Precautions: Fall Precaution Comments: right leg weakness Restrictions Weight Bearing Restrictions: No  Vital Signs: Therapy Vitals Temp: 97.7 F (36.5 C) Temp src: Oral Pulse Rate: 65 Resp: 18 BP: 96/68 mmHg Patient Position (if appropriate): Lying Oxygen Therapy SpO2: 97 % O2 Device: None (Room air)  Pain: Pain Assessment Pain Assessment: 0-10 Pain Score: 7  Pain Type: Neuropathic pain Pain Location: Leg Pain Orientation: Right Pain Descriptors /  Indicators: Cramping Pain Onset: On-going Pain Intervention(s): Medication (See eMAR);Shower  See FIM for current functional status  Therapy/Group: Individual Therapy  Ghada Abbett 02/16/2014, 9:12 AM

## 2014-02-16 NOTE — Progress Notes (Addendum)
38 y.o. female with h/o depression, chronic pancreatitis, MS diagnosed 08/2013 and on copaxone;who was admitted on 02/11/14 with numbness and weakness RLE progressing to RUE weakness and right facial numbness with stuttering speech. MRI brain stable without new lesions and MRI cervical spine without evidence of MS involvement. She was started on high dose solumedrol for MS exacerbation per neurology input and reported to have improvement in symptoms  Subjective/Complaints:   Objective: Vital Signs: Blood pressure 96/68, pulse 65, temperature 97.7 F (36.5 C), temperature source Oral, resp. rate 18, height 5' 4"  (1.626 m), weight 118.348 kg (260 lb 14.6 oz), last menstrual period 01/27/2014, SpO2 97.00%. No results found. Results for orders placed during the hospital encounter of 02/14/14 (from the past 72 hour(s))  CBC WITH DIFFERENTIAL     Status: Abnormal   Collection Time    02/15/14  5:50 AM      Result Value Ref Range   WBC 12.7 (*) 4.0 - 10.5 K/uL   RBC 3.72 (*) 3.87 - 5.11 MIL/uL   Hemoglobin 10.4 (*) 12.0 - 15.0 g/dL   HCT 32.4 (*) 36.0 - 46.0 %   MCV 87.1  78.0 - 100.0 fL   MCH 28.0  26.0 - 34.0 pg   MCHC 32.1  30.0 - 36.0 g/dL   RDW 14.7  11.5 - 15.5 %   Platelets 286  150 - 400 K/uL   Neutrophils Relative % 62  43 - 77 %   Neutro Abs 7.8 (*) 1.7 - 7.7 K/uL   Lymphocytes Relative 30  12 - 46 %   Lymphs Abs 3.9  0.7 - 4.0 K/uL   Monocytes Relative 8  3 - 12 %   Monocytes Absolute 1.1 (*) 0.1 - 1.0 K/uL   Eosinophils Relative 0  0 - 5 %   Eosinophils Absolute 0.0  0.0 - 0.7 K/uL   Basophils Relative 0  0 - 1 %   Basophils Absolute 0.0  0.0 - 0.1 K/uL  COMPREHENSIVE METABOLIC PANEL     Status: Abnormal   Collection Time    02/15/14  5:50 AM      Result Value Ref Range   Sodium 140  137 - 147 mEq/L   Potassium 4.0  3.7 - 5.3 mEq/L   Chloride 102  96 - 112 mEq/L   CO2 25  19 - 32 mEq/L   Glucose, Bld 118 (*) 70 - 99 mg/dL   BUN 20  6 - 23 mg/dL   Creatinine, Ser 0.56   0.50 - 1.10 mg/dL   Calcium 8.3 (*) 8.4 - 10.5 mg/dL   Total Protein 6.9  6.0 - 8.3 g/dL   Albumin 3.0 (*) 3.5 - 5.2 g/dL   AST 11  0 - 37 U/L   ALT 19  0 - 35 U/L   Alkaline Phosphatase 65  39 - 117 U/L   Total Bilirubin <0.2 (*) 0.3 - 1.2 mg/dL   GFR calc non Af Amer >90  >90 mL/min   GFR calc Af Amer >90  >90 mL/min   Comment: (NOTE)     The eGFR has been calculated using the CKD EPI equation.     This calculation has not been validated in all clinical situations.     eGFR's persistently <90 mL/min signify possible Chronic Kidney     Disease.      Physical Exam  Nursing note and vitals reviewed.  Constitutional: She is oriented to person, place, and time. She appears well-developed and well-nourished.  Morbidly obese  HENT:  Head: Normocephalic and atraumatic.  Eyes: Pupils are equal, round, and reactive to light.  Neck: Neck supple.  Cardiovascular: Normal rate and regular rhythm.  Respiratory: Effort normal and breath sounds normal. No respiratory distress. She has no wheezes.  GI: Soft. Bowel sounds are normal. She exhibits no distension. There is no tenderness.  Musculoskeletal: She exhibits no edema and no tenderness.  Neurological: She is alert and oriented to person, place, and time.  Speech clear today. Able to follow commands without difficulty. Motor Strength --RUE 3-/5,RLE 2- HF, KE, trace ADF/PF. 5/5 on Left side. Sensory mildly diminished RLE   Assessment/Plan: 1. Functional deficits secondary to MS exacerbation which require 3+ hours per day of interdisciplinary therapy in a comprehensive inpatient rehab setting. Physiatrist is providing close team supervision and 24 hour management of active medical problems listed below. Physiatrist and rehab team continue to assess barriers to discharge/monitor patient progress toward functional and medical goals. FIM: FIM - Bathing Bathing Steps Patient Completed: Chest;Right Arm;Left Arm;Abdomen;Front perineal  area;Buttocks;Right upper leg;Left upper leg;Right lower leg (including foot);Left lower leg (including foot) Bathing: 6: Assistive device (Comment) (long sponge)  FIM - Upper Body Dressing/Undressing Upper body dressing/undressing steps patient completed: Thread/unthread right bra strap;Thread/unthread left bra strap;Thread/unthread right sleeve of pullover shirt/dresss;Thread/unthread left sleeve of pullover shirt/dress;Put head through opening of pull over shirt/dress;Pull shirt over trunk Upper body dressing/undressing: 5: Set-up assist to: Obtain clothing/put away FIM - Lower Body Dressing/Undressing Lower body dressing/undressing steps patient completed: Thread/unthread right pants leg;Thread/unthread left pants leg;Pull pants up/down Lower body dressing/undressing: 4: Min-Patient completed 75 plus % of tasks (unable to don shoes d/t time (pref to self-feed instead))  FIM - Toileting Toileting steps completed by patient: Adjust clothing prior to toileting;Performs perineal hygiene;Adjust clothing after toileting Toileting: 6: More than reasonable amount of time  FIM - Radio producer Devices: Walker;Grab bars;Bedside commode Toilet Transfers: 6-To toilet/ BSC;6-From toilet/BSC  FIM - Bed/Chair Transfer Bed/Chair Transfer Assistive Devices: Walker;HOB elevated Bed/Chair Transfer: 6: Bed > Chair or W/C: No assist;6: Supine > Sit: No assist  FIM - Locomotion: Wheelchair Locomotion: Wheelchair: 2: Travels 50 - 149 ft with minimal assistance (Pt.>75%) FIM - Locomotion: Ambulation Locomotion: Ambulation Assistive Devices: Administrator Ambulation/Gait Assistance: 4: Min assist Locomotion: Ambulation: 1: Travels less than 50 ft with minimal assistance (Pt.>75%)  Comprehension Comprehension Mode: Auditory Comprehension: 7-Follows complex conversation/direction: With no assist  Expression Expression Mode: Verbal Expression: 6-Expresses complex ideas:  With extra time/assistive device  Social Interaction Social Interaction: 6-Interacts appropriately with others with medication or extra time (anti-anxiety, antidepressant).  Problem Solving Problem Solving: 6-Solves complex problems: With extra time  Memory Memory: 5-Requires cues to use assistive device  Medical Problem List and Plan:  1. Functional deficits secondary to MS exacerbation.  2. DVT Prophylaxis/Anticoagulation: Pharmaceutical: Lovenox  3. Pain Management: She does not want to use neurontin to help with dysesthesias. Will schedule ultram. Oxycodone prn for severe pain.  4. Anxiety/depression/Mood: team to provide ego support. LCSW to follow for evaluation and support.  5. Neuropsych: This patient is capable of making decisions on her own behalf.  6. Dysphagia/Throat pain: Will continue protonix. Continue modified diet.  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

## 2014-02-16 NOTE — Progress Notes (Signed)
Physical Therapy Note  Patient Details  Name: OMOLOLA HUCKINS MRN: [id number] Date of Birth: 06-07-1976 Today's Date: 02/16/2014  Time: 0845 - 930 45 minutes  1:1, c/o pain in RLE upon standing, rest as appropriate.  Pt missed 15 min PT due to breakfast.  Pt received sitting EOB, supervision for balance while donning shoes and socks, required assistance for R shoe and sock.  Ambulated 73' using RW, theraband to facilitate DF/knee flex/hip flex and vc's for active DF and hip flexion, demonstrated increased toe clearance during gait.  Pt propelled w/c 100' with min A, noted difficulty with UE use and grip on R.  PT made adjustments to w/c, raised wheels for better pt UE control, added theraband on rim to increase grip.  Sit <> sidelying <> supine with supervision, cueing for sequencing and use of LLE to assist RLE.  Supine PNF D1 LE, rhythmic initiation and active assist, noted assistance during hip ER, IR, flexion, and ext.  Pt propelled w/c 175' with supervision, vc's for efficient and symmetrical UE pushing, noted increased control and efficiency with propulsion.  Alyson Ingles 02/16/2014, 11:50 AM

## 2014-02-17 ENCOUNTER — Inpatient Hospital Stay (HOSPITAL_COMMUNITY): Payer: Self-pay | Admitting: Speech Pathology

## 2014-02-17 ENCOUNTER — Inpatient Hospital Stay (HOSPITAL_COMMUNITY): Payer: Self-pay

## 2014-02-17 ENCOUNTER — Encounter (HOSPITAL_COMMUNITY): Payer: Self-pay | Admitting: Occupational Therapy

## 2014-02-17 MED ORDER — GLATIRAMER ACETATE 40 MG/ML ~~LOC~~ SOSY
40.0000 mg | PREFILLED_SYRINGE | SUBCUTANEOUS | Status: AC
  Administered 2014-02-18: 40 mg via SUBCUTANEOUS

## 2014-02-17 MED ORDER — GLATIRAMER ACETATE 40 MG/ML ~~LOC~~ SOSY
40.0000 mg | PREFILLED_SYRINGE | SUBCUTANEOUS | Status: AC
  Administered 2014-02-20: 40 mg via SUBCUTANEOUS
  Filled 2014-02-17: qty 1

## 2014-02-17 NOTE — IPOC Note (Addendum)
Overall Plan of Care [facility]) Patient Details Name: Mary [name] MRN: [id number] DOB: 06-12-1976  Admitting Diagnosis: MS Exac  Hospital Problems: Active Problems:   Exacerbation of multiple sclerosis     Functional Problem List: Nursing Bladder;Bowel;Endurance;Medication Management;Nutrition;Pain;Safety;Skin Integrity  PT Balance;Endurance;Motor;Pain;Safety;Sensory  OT Balance;Endurance;Pain;Safety;Sensory;Vision  SLP Cognition  TR Activity tolerance, balance, safety, pain       Basic ADL's: OT Eating;Grooming;Bathing;Dressing;Toileting     Advanced  ADL's: OT Simple Meal Preparation     Transfers: PT Bed Mobility;Bed to Chair;Car;Furniture  OT Toilet;Tub/Shower     Locomotion: PT Ambulation;Wheelchair Mobility     Additional Impairments: OT Fuctional Use of Upper Extremity  SLP Swallowing;Communication;Social Cognition expression Problem Solving  TR      Anticipated Outcomes Item Anticipated Outcome  Self Feeding Mod I  Swallowing  Mod I with least restrictive diet   Basic self-care  Mod I  Toileting  Mod I   Bathroom Transfers Mod I  Bowel/Bladder  [place] of bowel and bladder at modified Indepent level  Transfers  mod I  Locomotion  mod I  Communication  Mod I   Cognition  Mod I  Pain  Pain will be 4 on scale of 1-10  Safety/Judgment  Min assistance   Therapy Plan: PT Intensity: Minimum of 1-2 x/day ,45 to 90 minutes PT Frequency: 5 out of 7 days PT Duration Estimated Length of Stay: 7 days OT Intensity: Minimum of 1-2 x/day, 45 to 90 minutes OT Frequency: 5 out of 7 days OT Duration/Estimated Length of Stay: 7days SLP Intensity: Minumum of 1-2 x/day, 30 to 90 minutes SLP Frequency: 5 out of 7 days SLP Duration/Estimated Length of Stay: 7 days   TR Frequency:  Min 1 time a week for >20 minutes TR ELOS:  7 days      Team Interventions: Nursing Interventions Patient/Family Education;Bladder Management;Bowel Management;Disease  Management/Prevention;Pain Management;Medication Management;Psychosocial Support;Discharge Planning;Skin Care/Wound Proofreader  PT interventions Ambulation/gait training;Balance/vestibular training;Discharge planning;Community reintegration;Functional mobility training;DME/adaptive equipment instruction;Neuromuscular re-education;Pain management;Functional electrical stimulation;Patient/family education;Splinting/orthotics;Therapeutic Activities;Therapeutic Exercise;UE/LE Strength taining/ROM;UE/LE Coordination activities;Wheelchair propulsion/positioning  OT Interventions Balance/vestibular training;Patient/family education;Self Care/advanced ADL retraining;Splinting/orthotics;Therapeutic Exercise;UE/LE Coordination activities;Neuromuscular re-education;Functional electrical stimulation;Disease mangement/prevention;Community reintegration;Discharge planning;DME/adaptive equipment instruction;Therapeutic Activities;Psychosocial support;Pain management;Functional mobility training;UE/LE Strength taining/ROM  SLP Interventions Cognitive remediation/compensation;Cueing hierarchy;Dysphagia/aspiration precaution training;Functional tasks;Environmental controls;Internal/external aids;Speech/Language facilitation;Patient/family education;Therapeutic Activities;Therapeutic Exercise  TR Interventions Activity tolerance, functional mobility, balance,safety, pain, anxiety/stress  SW/CM Interventions Discharge Planning;Psychosocial Support;Patient/Family Education    Team Discharge Planning: Destination: PT-Home ,OT- Home , SLP-Home Projected Follow-up: PT-Home health PT, OT-  Outpatient OT, SLP- (TBD) Projected Equipment Needs: PT-To be determined, OT- To be determined, SLP-None recommended by SLP Equipment Details: PT- , OT-  Patient/family involved in discharge planning: PT- Patient,  OT-Patient, SLP-Patient  MD ELOS: 7d Medical Rehab Prognosis:  Excellent Assessment:  38 y.o. female with h/o depression, chronic pancreatitis, MS diagnosed 08/2013 and on copaxone;who was admitted on 02/11/14 with numbness and weakness RLE progressing to RUE weakness and right facial numbness with stuttering speech. MRI brain stable without new lesions and MRI cervical spine without evidence of MS involvement. She was started on high dose solumedrol for MS exacerbation per neurology input   Now requiring 24/7 Rehab RN,MD, as well as CIR level PT, OT and SLP.  Treatment team will focus on ADLs and mobility with goals set at Mod I   See Team Conference Notes for weekly updates to the plan of care

## 2014-02-17 NOTE — Progress Notes (Signed)
02/17/14 1516 nsg Patient refuses lovenox shots and claims she will continue to refuse it. PA aware.

## 2014-02-17 NOTE — Progress Notes (Signed)
Occupational Therapy Session Note  Patient Details  Name: Mary [name] MRN: [id number] Date of Birth: [birth year]  Today's Date: 02/17/2014 Time: 1350-1440 Time Calculation (min): 50 min  Short Term Goals: Week 1:  OT Short Term Goal 1 (Week 1): STG=LTG due to short ELOS  Skilled Therapeutic Interventions/Progress Updates: Therapeutic activities with focus on patient re-ed relating to energy conservation, instruction on use of AE to compensate for R-LE weakness, and assessment of grip/pinch strength with exercises providing using thera-putty (soft, yellow).  Patient demonstrates emerging awareness of need for seated ADL to conserve energy although needing reinforcement to remember to shower using tepid temperatures versus hot water.     Grip/Pinch                                 R          L Gross grip            10 lbs    32 lbs   (per [employer]) Key pinch               3          10        (pinch gage) Tip pinch                1            6 Palmar pinch         3           10    Patient able to complete 6 of 11 grip/pinch exercises demonstrated using thera-putty with hand guidance required to perform exercises correctly.   Written instructions provided.  AE: Patient able to use sock aid and reacher to improve lower body dressing skills after 1 demonstration.   OT provided elastic laces this session with assist to install and don shoes.   Advised patient to purchase kit for AE.  Therapy Documentation Precautions:  Precautions Precautions: Fall Precaution Comments: right leg weakness Restrictions Weight Bearing Restrictions: No  Pain: Pain Assessment Pain Assessment: 0-10 Pain Score: 2  Pain Type: Neuropathic pain Pain Location: Leg Pain Orientation: Right Pain Descriptors / Indicators: Aching Pain Intervention(s): Medication (See eMAR)  See FIM for current functional status  Therapy/Group: Individual Therapy  Karletta Millay 02/17/2014, 3:16 PM

## 2014-02-17 NOTE — Progress Notes (Signed)
Speech Language Pathology Daily Session Note  Patient Details  Name: Mary [name] MRN: [id number] Date of Birth: [birth year]  Today's Date: 02/17/2014 Time: 4098-1191 Time Calculation (min): 33 min  Short Term Goals: Week 1: SLP Short Term Goal 1 (Week 1): Pt will utilize external memory aids to recall new, daily information with Mod I.  SLP Short Term Goal 2 (Week 1): Pt will use compensatory strategies to facilitate improved speech fluency at the conversation level (easy onset) with Mod I.  SLP Short Term Goal 3 (Week 1): Pt will utilize compensatory strategies to decrease globus sensation (alternate solids/liquids, small bites/sips) with currnet diet with Mod I.  Skilled Therapeutic Interventions:  Pt was seen for skilled speech therapy targeting compensatory strategies for fluency.  Upon arrival, pt was upset and anxious about her lunch tray being left in her room with pt stating "I'm OCD and having stuff like that in my room makes me feel anxious and get agitated."  Despite pt reporting feelings of anxiety and agitation, pt's fluency was improved from yesterday's therapy session (>75% fluent in conversations).  SLP removed tray from pt's room and facilitated the session with deep breathing exercises and gentle stretching of neck and shoulder muscles to relieve tension.  Pt was 100% fluent during humming and chanting exercises independently, and >75% fluent for structured phrase repetition tasks.  SLP also initiated trials of CV syllable repetition targeting transitions from voiced to voiceless sounds.   Recommendations for intermittent periods of purposeful stuttering to improve feelings of control over her dysfluency were reviewed and reinforced during today's session for both structured and unstructured tx tasks.  Continue per current plan of care.   FIM:  Comprehension Comprehension Mode: Auditory Comprehension: 7-Follows complex conversation/direction: With no  assist Expression Expression Mode: Verbal Expression: 6-Expresses complex ideas: With extra time/assistive device Social Interaction Social Interaction: 6-Interacts appropriately with others with medication or extra time (anti-anxiety, antidepressant). Problem Solving Problem Solving: 5-Solves complex 90% of the time/cues < 10% of the time Memory Memory: 5-Recognizes or recalls 90% of the time/requires cueing < 10% of the time FIM - Eating Eating Activity: 7: Complete independence:no helper  Pain Pain Assessment Pain Assessment: No/denies pain  Therapy/Group: Individual Therapy  Jackalyn Lombard, M.A. CCC-SLP  Avonell Lenig, Melanee Spry 02/17/2014, 4:07 PM

## 2014-02-17 NOTE — Progress Notes (Signed)
38 y.o. female with h/o depression, chronic pancreatitis, MS diagnosed 08/2013 and on copaxone;who was admitted on 02/11/14 with numbness and weakness RLE progressing to RUE weakness and right facial numbness with stuttering speech. MRI brain stable without new lesions and MRI cervical spine without evidence of MS involvement. She was started on high dose solumedrol for MS exacerbation per neurology input and reported to have improvement in symptoms  Subjective/Complaints: No New issues  ROS:  Right side weak  Objective: Vital Signs: Blood pressure 91/62, pulse 70, temperature 98.7 F (37.1 C), temperature source Oral, resp. rate 18, height _0  (1.626 m), weight 118.348 kg (260 lb 14.6 oz), last menstrual period 01/27/2014, SpO2 93.00%. No results found. Results for orders placed during the hospital encounter of 02/14/14 (from the past 72 hour(s))  CBC WITH DIFFERENTIAL     Status: Abnormal   Collection Time    02/15/14  5:50 AM      Result Value Ref Range   WBC 12.7 (*) 4.0 - 10.5 K/uL   RBC 3.72 (*) 3.87 - 5.11 MIL/uL   Hemoglobin 10.4 (*) 12.0 - 15.0 g/dL   HCT 32.4 (*) 36.0 - 46.0 %   MCV 87.1  78.0 - 100.0 fL   MCH 28.0  26.0 - 34.0 pg   MCHC 32.1  30.0 - 36.0 g/dL   RDW 14.7  11.5 - 15.5 %   Platelets 286  150 - 400 K/uL   Neutrophils Relative % 62  43 - 77 %   Neutro Abs 7.8 (*) 1.7 - 7.7 K/uL   Lymphocytes Relative 30  12 - 46 %   Lymphs Abs 3.9  0.7 - 4.0 K/uL   Monocytes Relative 8  3 - 12 %   Monocytes Absolute 1.1 (*) 0.1 - 1.0 K/uL   Eosinophils Relative 0  0 - 5 %   Eosinophils Absolute 0.0  0.0 - 0.7 K/uL   Basophils Relative 0  0 - 1 %   Basophils Absolute 0.0  0.0 - 0.1 K/uL  COMPREHENSIVE METABOLIC PANEL     Status: Abnormal   Collection Time    02/15/14  5:50 AM      Result Value Ref Range   Sodium 140  137 - 147 mEq/L   Potassium 4.0  3.7 - 5.3 mEq/L   Chloride 102  96 - 112 mEq/L   CO2 25  19 - 32 mEq/L   Glucose, Bld 118 (*) 70 - 99 mg/dL   BUN 20   6 - 23 mg/dL   Creatinine, Ser 0.56  0.50 - 1.10 mg/dL   Calcium 8.3 (*) 8.4 - 10.5 mg/dL   Total Protein 6.9  6.0 - 8.3 g/dL   Albumin 3.0 (*) 3.5 - 5.2 g/dL   AST 11  0 - 37 U/L   ALT 19  0 - 35 U/L   Alkaline Phosphatase 65  39 - 117 U/L   Total Bilirubin <0.2 (*) 0.3 - 1.2 mg/dL   GFR calc non Af Amer >90  >90 mL/min   GFR calc Af Amer >90  >90 mL/min   Comment: (NOTE)     The eGFR has been calculated using the CKD EPI equation.     This calculation has not been validated in all clinical situations.     eGFR's persistently <90 mL/min signify possible Chronic Kidney     Disease.      Physical Exam  Nursing note and vitals reviewed.  Constitutional: She is oriented to person,  place, and time. She appears well-developed and well-nourished.  Morbidly obese  HENT:  Head: Normocephalic and atraumatic.  Eyes: Pupils are equal, round, and reactive to light.  Neck: Neck supple.  Cardiovascular: Normal rate and regular rhythm.  Respiratory: Effort normal and breath sounds normal. No respiratory distress. She has no wheezes.  GI: Soft. Bowel sounds are normal. She exhibits no distension. There is no tenderness.  Musculoskeletal: She exhibits no edema and no tenderness.  Neurological: She is alert and oriented to person, place, and time.  Speech clear today. Able to follow commands without difficulty. Motor Strength --RUE 3-/5,RLE 2- HF, KE, trace ADF/PF. 5/5 on Left side. Sensory mildly diminished RLE   Assessment/Plan: 1. Functional deficits secondary to MS exacerbation which require 3+ hours per day of interdisciplinary therapy in a comprehensive inpatient rehab setting. Physiatrist is providing close team supervision and 24 hour management of active medical problems listed below. Physiatrist and rehab team continue to assess barriers to discharge/monitor patient progress toward functional and medical goals. FIM: FIM - Bathing Bathing Steps Patient Completed: Chest;Right Arm;Left  Arm;Abdomen;Front perineal area;Buttocks;Right upper leg;Left upper leg;Right lower leg (including foot);Left lower leg (including foot) Bathing: 6: Assistive device (Comment) (long sponge)  FIM - Upper Body Dressing/Undressing Upper body dressing/undressing steps patient completed: Thread/unthread right bra strap;Thread/unthread left bra strap;Thread/unthread right sleeve of pullover shirt/dresss;Thread/unthread left sleeve of pullover shirt/dress;Put head through opening of pull over shirt/dress;Pull shirt over trunk Upper body dressing/undressing: 5: Set-up assist to: Obtain clothing/put away FIM - Lower Body Dressing/Undressing Lower body dressing/undressing steps patient completed: Thread/unthread right pants leg;Thread/unthread left pants leg;Pull pants up/down Lower body dressing/undressing: 4: Min-Patient completed 75 plus % of tasks (unable to don shoes d/t time (pref to self-feed instead))  FIM - Toileting Toileting steps completed by patient: Adjust clothing prior to toileting;Performs perineal hygiene;Adjust clothing after toileting Toileting: 6: More than reasonable amount of time  FIM - Radio producer Devices: Walker;Grab bars;Bedside commode Toilet Transfers: 6-To toilet/ BSC;6-From toilet/BSC  FIM - Bed/Chair Transfer Bed/Chair Transfer Assistive Devices: Adult nurse Transfer: 4: Sit > Supine: Min A (steadying pt. > 75%/lift 1 leg);4: Chair or W/C > Bed: Min A (steadying Pt. > 75%)  FIM - Locomotion: Wheelchair Locomotion: Wheelchair: 4: Travels 150 ft or more: maneuvers on rugs and over door sillls with minimal assistance (Pt.>75%) FIM - Locomotion: Ambulation Locomotion: Ambulation Assistive Devices: Administrator Ambulation/Gait Assistance: 4: Min assist Locomotion: Ambulation: 2: Travels 50 - 149 ft with minimal assistance (Pt.>75%)  Comprehension Comprehension Mode: Auditory Comprehension: 7-Follows complex  conversation/direction: With no assist  Expression Expression Mode: Verbal Expression: 6-Expresses complex ideas: With extra time/assistive device  Social Interaction Social Interaction: 6-Interacts appropriately with others with medication or extra time (anti-anxiety, antidepressant).  Problem Solving Problem Solving: 5-Solves basic problems: With no assist  Memory Memory: 5-Requires cues to use assistive device  Medical Problem List and Plan:  1. Functional deficits secondary to MS exacerbation.  2. DVT Prophylaxis/Anticoagulation: Pharmaceutical: Lovenox  3. Pain Management: She does not want to use neurontin to help with dysesthesias. Will schedule ultram. Oxycodone prn for severe pain.  4. Anxiety/depression/Mood: team to provide ego support. LCSW to follow for evaluation and support.  5. Neuropsych: This patient is capable of making decisions on her own behalf.  6. Dysphagia/Throat pain: Will continue protonix. Continue modified diet.  7.  

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
## 2014-02-17 NOTE — Progress Notes (Signed)
Physical Therapy Session Note  Patient Details  Name: Mary [name] MRN: [id number] Date of Birth: [birth year]  Today's Date: 02/17/2014 [id number] Time Calculation: 45 minutes  Short Term Goals: Week 1:     Skilled Therapeutic Interventions/Progress Updates:    Pt received seated EOB, agreeable to participate in therapy after eating breakfast (tray was delivered at 0830). Pt missed 15 minutes of scheduled PT due to eating breakfast. Session focused on functional ambulation, increasing WBing through RLE, and NMR for RLE. Pt ambulated 75'x3 w/ RW w/ Min Guard w/ Ace wrap for DF/knee flexion/hip flexion, noted increased active swing in RLE vs. yesterday. Pt completed x5 sit/stands w/ step under L foot in order to promote WBing through RLE. Also completed x10 foot taps w/ LLE on step w/ BUE support and x10 w/ LUE support. Seated D1 PNF w/ rhythmic initiation, noted increased activation in RLE. Pt tolerated session well. Pt propelled w/c 75' to room, left seated in w/c w/ all needs within reach.  Therapy Documentation Precautions:  Precautions Precautions: Fall Precaution Comments: right leg weakness Restrictions Weight Bearing Restrictions: No General: Amount of Missed PT Time (min): 15 Minutes Missed Time Reason: Other (comment) (eating breakfast) Vital Signs:   Pain:   Mobility:   Locomotion : Ambulation Ambulation/Gait Assistance: 4: Min guard  Trunk/Postural Assessment :    Balance:   Exercises:   Other Treatments:    See FIM for current functional status  Therapy/Group: Individual Therapy  Mary [name] 02/17/2014, 11:40 AM

## 2014-02-17 NOTE — Progress Notes (Signed)
Occupational Therapy Session Note  Patient Details  Name: Mary [name] MRN: [id number] Date of Birth: 09/09/1975  Today's Date: 02/17/2014 Time: 1030-1130 Time Calculation (min): 60 min  Short Term Goals: Week 1:  OT Short Term Goal 1 (Week 1): STG=LTG due to short ELOS  Skilled Therapeutic Interventions/Progress Updates:  Patient received seated in w/c with minimal complaints of pain, no rate given - Rn aware. Patient set-up w/c with supervision and stood with RW with close supervision. Patient then ambulated w/c > bathroom for toilet transfer, toileting, then shower stall transfer. Patient completed UB/LB bathing in sit<>stand position at mod I level. Patient then ambulated back to [facility] seated over toilet seat for UB/LB dressing in sit<>stand position. Patient ambulated > sink for grooming task of brushing teeth in standing position with supervision. Patient then sat in w/c with complaints of fatigue. Therapist educated patient on energy conservation techniques, giving her printed handouts to read. Therapist then educated patient on BUE strengthening exercises using theraband.  At end of session, left patient seated in w/c with all needs within reach.   Precautions:  Precautions Precautions: Fall Precaution Comments: right leg weakness Restrictions Weight Bearing Restrictions: No  See FIM for current functional status  Therapy/Group: Individual Therapy  Ola Raap 02/17/2014, 12:06 PM

## 2014-02-18 ENCOUNTER — Inpatient Hospital Stay (HOSPITAL_COMMUNITY): Payer: Medicaid Other

## 2014-02-18 ENCOUNTER — Encounter (HOSPITAL_COMMUNITY): Payer: Medicaid Other | Admitting: Occupational Therapy

## 2014-02-18 ENCOUNTER — Inpatient Hospital Stay (HOSPITAL_COMMUNITY): Payer: Self-pay | Admitting: Occupational Therapy

## 2014-02-18 DIAGNOSIS — M79609 Pain in unspecified limb: Secondary | ICD-10-CM

## 2014-02-18 DIAGNOSIS — K862 Cyst of pancreas: Secondary | ICD-10-CM

## 2014-02-18 DIAGNOSIS — K863 Pseudocyst of pancreas: Secondary | ICD-10-CM

## 2014-02-18 LAB — D-DIMER, QUANTITATIVE: D-Dimer, Quant: 1.33 ug/mL-FEU — ABNORMAL HIGH (ref 0.00–0.48)

## 2014-02-18 MED ORDER — ENOXAPARIN SODIUM 120 MG/0.8ML ~~LOC~~ SOLN
120.0000 mg | Freq: Two times a day (BID) | SUBCUTANEOUS | Status: DC
  Administered 2014-02-18 – 2014-02-19 (×2): 120 mg via SUBCUTANEOUS
  Filled 2014-02-18 (×4): qty 0.8

## 2014-02-18 NOTE — Progress Notes (Signed)
Occupational Therapy Session Note  Patient Details  Name: Mary [name] MRN: [id number] Date of Birth: 02/16/1976  Today's Date: 02/18/2014 Time: 0800-0900 Time Calculation (min): 60 min  Short Term Goals: Week 1:  OT Short Term Goal 1 (Week 1): STG=LTG due to short ELOS  Skilled Therapeutic Interventions/Progress Updates:    Patient seen this am for OT intervention to address functional mobility during basic self care skills.  Patient continues to experience difficulty reaching forward toward feet as needed for lower body dressing.  Patient is familiar with reacher, and has heard of sock aide.  Patient encouraged to attempt reaching to feet from sitting surface which allowed feet to rach the floor to increase comfort with forward weight shift.  Patient able to don/doff sock from seated position in chair using foot stool to elevate height of right foot.  Patient unable to manage right leg to transition from sit to supine in bed.  Patient fearful of falling out of bed.  Practiced left leg under right and use of momentum to get legs into bed, approaching bed from right side.  Patient able to effectively complete using this method.    Therapy Documentation Precautions:  Precautions Precautions: Fall Precaution Comments: right leg weakness Restrictions Weight Bearing Restrictions: No     Pain:  7/10 right leg   See FIM for current functional status  Therapy/Group: Individual Therapy  Collier Salina 02/18/2014, 9:18 AM

## 2014-02-18 NOTE — Progress Notes (Signed)
VASCULAR LAB PRELIMINARY  PRELIMINARY  PRELIMINARY  PRELIMINARY  Right lower extremity venous Doppler completed.    Preliminary report:  There is no DVT or SVT noted in the right lower extremity.   Shuntay Everetts, RVT 02/18/2014, 2:45 PM

## 2014-02-18 NOTE — Progress Notes (Signed)
02/18/14 1155 nsg RN was called to room to assess rt lower leg pain.Physical Therapy currently  working with pt he said he felt tender areas at the back of her leg, patient  rated pain as 9/10 pain med given. Rt leg colder than lt leg; Rt leg> than Lt leg placed pillow under her leg while in bed.  Patient has been refusing her lovenox PA made aware yesterday. Dr. Posey Rea notified new orders noted.

## 2014-02-18 NOTE — Progress Notes (Signed)
ANTICOAGULATION CONSULT NOTE - Initial Consult  Pharmacy Consult for Lovenox Indication: rule out DVT  Allergies  Allergen Reactions  . Amoxicillin Anaphylaxis  . Penicillins Anaphylaxis  . Latex Itching  . Morphine And Related Nausea And Vomiting    Patient Measurements: Height: 5\' 4"  (162.6 cm) Weight: 260 lb 14.6 oz (118.348 kg) IBW/kg (Calculated) : 54.7  Vital Signs: Temp: 98.4 F (36.9 C) (07/04 0538) Temp src: Oral (07/04 0538) BP: 102/64 mmHg (07/04 0538) Pulse Rate: 69 (07/04 0538)  Labs: No results found for this basename: HGB, HCT, PLT, APTT, LABPROT, INR, HEPARINUNFRC, CREATININE, CKTOTAL, CKMB, TROPONINI,  in the last 72 hours  Estimated Creatinine Clearance: 122.9 ml/min (by C-G formula based on Cr of 0.56).   Medical History: Past Medical History  Diagnosis Date  . Hernia 03-24-13    ventral hernia remains   . GERD (gastroesophageal reflux disease)   . Pancreatitis 03-24-13    2002, 2'2013, 03-14-13  . Pancreatic cyst 2002 onset  . Anxiety   . Depression     "recent breakup with partner of 15 yrs"  . Adrenal insufficiency 04/23/2013    ??  . MS (multiple sclerosis)     Assessment: 38 year old female with MS beginning Lovenox for presumed DVT Renal function stable  Goal of Therapy:  Anti-Xa level 0.6-1 units/ml 4hrs after LMWH dose given Monitor platelets by anticoagulation protocol: Yes   Plan:  1) Lovenox 120 mg sq Q 12 hours 2) Follow up CBC, plan  Thank you. Okey Regal, PharmD [phone]  [name] 02/18/2014,2:14 PM

## 2014-02-18 NOTE — Progress Notes (Signed)
Physical Therapy Session Note  Patient Details  Name: Mary [name] MRN: [id number] Date of Birth: 1976-01-02  Today's Date: 02/18/2014 Time: 1115-1204 Time Calculation (min): 49 min  Short Term Goals: Week 1:     Skilled Therapeutic Interventions/Progress Updates:      Therapy Documentation Precautions:  Precautions Precautions: Fall Precaution Comments: right leg weakness Restrictions Weight Bearing Restrictions: No  Pain: 6-7/10 pre tx, 9/10 post tx. RN present and notified. Meds administered pre tx reported ineffective by Pt.; pt. Notified that med efficiency had insufficient time to occur during tx.  Therapeutic Activities: Transfers: supine<>sit x 2 with (S) to min A due to sequencing issue. Pt. Performed static sitting EOB and edge of chair displaying midline control, postural alignment. AAROM RLE toes, ankle, and knee with palpable min muscle contraction. Pt. Tolerated sustained heel cord stretches 5 x 30 second holds. Static standing x 5 with 1 minute holds. Pt. Tolerated increased weigh shifting for pre-gait related to ambulation goals mod A for safety and patient reported fear. Pt. Required multiple seated rest/recovery breaks with constant reassurance. Pt. Reports sister-in-law causes her distress, and may have been contributing factor for current MS exasserbation; pt. States she feels more calm at present medication levels.  RLE felt cooler than LLE; therapist's light touch able to discriminate skin tightness with perceived venous system engorgement. Pt. States reduced sensory reception on RLE; with visible swelling in calf region. Pt. States this is new and informed therapist that pain levels were at 9/10 with burning. RN notified and doctor consulted and tests ordered.   Pain: see above. 9/10 post tx. RN aware.  Pt. Returned supine to bed with RLE elevated with pt. Reported easing of pain level; still reported 9/10 burning sensation.  See FIM for current functional  status  Therapy/Group: Individual Therapy  Raiford Noble 02/18/2014, 12:57 PM

## 2014-02-18 NOTE — Progress Notes (Signed)
Patient ID: Mary [name], female   DOB: [birth year], 38 y.o.   MRN: [id number]  02/18/14.  38 y.o. female with h/o depression, chronic pancreatitis, MS diagnosed 08/2013 and on copaxone;who was admitted on 02/11/14 with numbness and weakness RLE progressing to RUE weakness and right facial numbness with stuttering speech. MRI brain stable without new lesions and MRI cervical spine without evidence of MS involvement. She was started on high dose solumedrol for MS exacerbation per neurology input and reported to have improvement in symptoms  Subjective/Complaints: No New issues  ROS:  Right side weak   Patient Vitals for the past 24 hrs:  BP Temp Temp src Pulse Resp SpO2  02/18/14 0538 102/64 mmHg 98.4 F (36.9 C) Oral 69 17 92 %  02/17/14 2143 108/68 mmHg 97.5 F (36.4 C) Oral 84 18 95 %  02/17/14 1500 111/70 mmHg 97.8 F (36.6 C) Oral 81 20 95 %     Intake/Output Summary (Last 24 hours) at 02/18/14 0834 Last data filed at 02/18/14 0023  Gross per 24 hour  Intake    960 ml  Output      0 ml  Net    960 ml    Objective: Vital Signs: Blood pressure 102/64, pulse 69, temperature 98.4 F (36.9 C), temperature source Oral, resp. rate 17, height 5\' 4"  (1.626 m), weight 260 lb 14.6 oz (118.348 kg), last menstrual period 01/27/2014, SpO2 92.00%. No results found. No results found for this or any previous visit (from the past 72 hour(s)).    Physical Exam  Nursing note and vitals reviewed.  Constitutional: She is oriented to person, place, and time. She appears well-developed and well-nourished.  Morbidly obese  HENT:  Tongue pierced Head: Normocephalic and atraumatic.  Eyes: Pupils are equal, round, and reactive to light.  Neck: Neck supple.  Cardiovascular: Normal rate and regular rhythm.  Respiratory: Effort normal and breath sounds normal. No respiratory distress. She has no wheezes.  GI: Soft. Bowel sounds are normal. She exhibits no distension. There is no tenderness.   Musculoskeletal: She exhibits no edema and no tenderness.  Neurological: She is alert and oriented to person, place, and time.  Speech clear today. Able to follow commands without difficulty. Motor Strength --RUE 3-/5,RLE 2- HF, KE, trace ADF/PF. 5/5 on Left side. Sensory mildly diminished RLE   Assessment/Plan: 1. Functional deficits secondary to MS exacerbation which require 3+ hours per day of interdisciplinary therapy in a comprehensive inpatient rehab setting. 2. DVT Prophylaxis/Anticoagulation: Pharmaceutical: Lovenox  3. Pain Management: She does not want to use neurontin to help with dysesthesias. Will schedule ultram. Oxycodone prn for severe pain.  4. Anxiety/depression/Mood: team to provide ego support. LCSW to follow for evaluation and support.  5. Neuropsych: This patient is capable of making decisions on her own behalf.  6. Dysphagia/Throat pain: Will continue protonix. Continue modified diet.  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

## 2014-02-18 NOTE — Progress Notes (Signed)
Occulational Therapy Note  Patient Details  Name: Mary [name] MRN: [id number] Date of Birth: 1976-07-06 Today's Date: 02/18/2014  Patient scheduled for OT this afternoon, although waiting results to rule out LE DVT.  Patient with increased pain in right leg, and per PT note, temp changes and palpable lump.    Collier Salina 02/18/2014, 1:45 PM

## 2014-02-18 NOTE — Progress Notes (Signed)
Speech Language Pathology Daily Session Note  Patient Details  Name: Mary [name] MRN: [id number] Date of Birth: [birth year]  Today's Date: 02/18/2014 Time: 0930-1010 Time Calculation (min): 40 min  Short Term Goals: Week 1: SLP Short Term Goal 1 (Week 1): Pt will utilize external memory aids to recall new, daily information with Mod I.  SLP Short Term Goal 2 (Week 1): Pt will use compensatory strategies to facilitate improved speech fluency at the conversation level (easy onset) with Mod I.  SLP Short Term Goal 3 (Week 1): Pt will utilize compensatory strategies to decrease globus sensation (alternate solids/liquids, small bites/sips) with currnet diet with Mod I.  Skilled Therapeutic Interventions: Pt was seen for skilled speech therapy targeting compensatory strategies for fluency. SLP facilitated session with deep breathing exercises and gentle stretching of neck and shoulder muscles to relieve tension. Pt was 100% fluent during humming and chanting exercises independently, and >75% fluent for structured phrase repetition tasks. During episodes of dysfluencies, patient demonstrated emergent awareness with Mod I, and required supervision level question cues to utilize strategies to increase fluidity of speech. Continue plan of care.   FIM:  Comprehension Comprehension Mode: Auditory Comprehension: 7-Follows complex conversation/direction: With no assist Expression Expression Mode: Verbal Expression: 5-Expresses complex 90% of the time/cues < 10% of the time Social Interaction Social Interaction: 6-Interacts appropriately with others with medication or extra time (anti-anxiety, antidepressant). Problem Solving Problem Solving: 5-Solves complex 90% of the time/cues < 10% of the time Memory Memory: 7-Complete Independence: No helper FIM - Eating Eating Activity: 7: Complete independence:no helper  Pain Pain Assessment Pain Assessment: No/denies pain  Therapy/Group: Individual  Therapy   Maxcine Ham, M.A. CCC-SLP [phone]  Maxcine Ham 02/18/2014, 10:52 AM

## 2014-02-19 ENCOUNTER — Inpatient Hospital Stay (HOSPITAL_COMMUNITY): Payer: Medicaid Other | Admitting: *Deleted

## 2014-02-19 ENCOUNTER — Inpatient Hospital Stay (HOSPITAL_COMMUNITY): Payer: Medicaid Other

## 2014-02-19 MED ORDER — ENOXAPARIN SODIUM 40 MG/0.4ML ~~LOC~~ SOLN
40.0000 mg | SUBCUTANEOUS | Status: DC
  Administered 2014-02-19 – 2014-02-23 (×5): 40 mg via SUBCUTANEOUS
  Filled 2014-02-19 (×6): qty 0.4

## 2014-02-19 NOTE — Progress Notes (Signed)
Physical Therapy Session Note  Patient Details  Name: Mary [name] MRN: [id number] Date of Birth: 12/23/1975  Today's Date: 02/19/2014 Session 1: Time: 0911-0958 Time Calculation (min): 47 min Session 2: Time: 5681-2751 Time calculation (min): 45 min  Short Term Goals: Week 1:    STG=LTGs due to ELOS   Skilled Therapeutic Interventions/Progress Updates:    Session 1: Pt received in bed eating breakfast at scheduled therapy time. Allowed pt time to finish breakfast, pt was agreeable to participate in therapy after return. Session focused on standing static and dynamic balance and gait activities. Pt ambulated 163' w/ RW w/ close supervision w/ no LOB noted w/ theraband applied to increased DF/knee flexion/hip flexion. In // bars pt ambulated 14' w/ LUE support only w/ MinA for weight shifting then 62' w/ supervision. Pt completed side stepping, backwards stepping w/ manual facilitation for R foot placement, and L foot taps on 8 inch step in parallel bars to improve gait and standing balance. Pt required rest breaks between each activity due to fatigue. Pt propelled w/c 163' back to room w/ supervision. Pt expressed need for bathroom upon return to room, assisted pt to bathroom, left on elevated commode. RN made aware of pt's position, pt stated she would pull call string when ready to stand. Pt was cleared to remain in bathroom by herself by OT yesterday.  Session 2: Pt received seated in w/c, agreeable to participate in therapy. Pt propelled w/c 150' to gym w/ supervision, then SPT w/ supervision to Nustep w/ no AD. Pt completed 8' on Nustep w/ therapist controlling pace in order to increase sensory feedback and facilitate activation of RLE. Pt noted to have increased activation of RLE pushing at higher paces. Pt then completed 2x30" static standing balance on foam mat, then 2x15 horseshoe toss on foam mat to address dynamic balance. Pt ambulated 150' back to room, left supine in bed w/ all needs  within reach.  Therapy Documentation Precautions:  Precautions Precautions: Fall Precaution Comments: right leg weakness Restrictions Weight Bearing Restrictions: No General: Amount of Missed PT Time (min): 13 Minutes Missed Time Reason: Other (comment) (Finishing breakfast at scheduled PT time) Pain Session 1: Pain Assessment Pain Assessment: 0-10 Pain Score: 6  Pain Type: Neuropathic pain Pain Location: Leg Pain Orientation: Right Pain Descriptors / Indicators: Burning;Shooting Pain Onset: On-going Patients Stated Pain Goal: 0 Pain Intervention(s): Repositioned Pain Session 2: Unrated, but reports better than AM, just tired. Locomotion : Ambulation Ambulation/Gait Assistance: 5: Supervision   See FIM for current functional status  Therapy/Group: Individual Therapy  Hosie Spangle Hosie Spangle, PT, DPT  02/19/2014, 10:00 AM

## 2014-02-19 NOTE — Progress Notes (Signed)
Complains of burning pain to RLE, numbness to right foot at times. Reluctant to try neurontin. Patient has taken 2 OXY IR at 2002 & at 0400. Also, flexeril given at 2154. Vistaril given per patient's request.  At HS complained of feeling constipated, PRN supp. Given with good results. RLE edema, top of right foot puffy. Alfredo Martinez A

## 2014-02-19 NOTE — Progress Notes (Signed)
Occupational Therapy Session Note  Patient Details  Name: Mary [name] MRN: [id number] Date of Birth: [birth year]  Today's Date: 02/19/2014 Time:  - 0730 -0830   (60 min) (1st session)     Short Term Goals: Week 1:  OT Short Term Goal 1 (Week 1): STG=LTG due to short ELOS Week 2:     Skilled Therapeutic Interventions/Progress Updates:    Therapeutic Activities:   Pt. Lying in bed upon OT arrival.  RN passing out pain pills.  Pt. Agreed to shower.  Pt was supervision with bed mobility.   Pt. Used RW to go to bathroom.  Addressed energy conservation techniques, functional mobility, balance during ADL activity.  Pt. Ambulated with RW and was supervision with transfer to shower bench.  Provided instructions for using reacher for energy conservation.   Needed instructional cues for sit><stand for hand placement.   Addressed using AE for energy conversation strategy.   Therapy Documentation Precautions:  Precautions Precautions: Fall Precaution Comments: right leg weakness Restrictions Weight Bearing Restrictions: No Pain: 8/10 RLE and intermittent back spasms  (1st session)       Other Treatments:   Time:  1300-1330  (30 min) 2nd session Pain:  none Individual session Addressed functional mobility and balance, UE strength.  Pt. Propelled wc to gift shop.  Utilized RW to walk around shop and obtain items.  Pt. Purchased items and then used wc to get back to room.   Left pt in wc with call bell,phone within reach.      See FIM for current functional status  Therapy/Group: Individual Therapy  Humberto Seals 02/19/2014, 7:43 AM

## 2014-02-19 NOTE — Progress Notes (Signed)
Patient ID: Mary [name], female   DOB: July 17, 1976, 38 y.o.   MRN: [id number]   Patient ID: Mary [name], female   DOB: 16-Jan-1976, 38 y.o.   MRN: [id number]  02/19/14.  38 y.o. female with h/o depression, chronic pancreatitis, MS diagnosed 08/2013 and on copaxone;who was admitted on 02/11/14 with numbness and weakness RLE progressing to RUE weakness and right facial numbness with stuttering speech. MRI brain stable without new lesions and MRI cervical spine without evidence of MS involvement. She was started on high dose solumedrol for MS exacerbation per neurology input and reported to have improvement in symptoms  Subjective/Complaints: Developed R leg pain and swelling yesterday and d dimer was elevated.  R leg venous doppler performed and negative.  Will resume Lovenox DVT prophylaxis  ROS:  Right side weak  Past Medical History  Diagnosis Date  . Hernia 03-24-13    ventral hernia remains   . GERD (gastroesophageal reflux disease)   . Pancreatitis 03-24-13    2002, 2'2013, 03-14-13  . Pancreatic cyst 2002 onset  . Anxiety   . Depression     "recent breakup with partner of 15 yrs"  . Adrenal insufficiency 04/23/2013    ??  . MS (multiple sclerosis)       Patient Vitals for the past 24 hrs:  BP Temp Temp src Pulse Resp SpO2  02/19/14 0539 92/62 mmHg 98.7 F (37.1 C) Oral 77 18 94 %  02/18/14 2046 110/69 mmHg 98.6 F (37 C) Oral 88 18 96 %  02/18/14 1500 120/71 mmHg 98.3 F (36.8 C) Oral 87 22 94 %     Intake/Output Summary (Last 24 hours) at 02/19/14 0804 Last data filed at 02/18/14 2300  Gross per 24 hour  Intake    480 ml  Output      0 ml  Net    480 ml    Objective: Vital Signs: Blood pressure 92/62, pulse 77, temperature 98.7 F (37.1 C), temperature source Oral, resp. rate 18, height 5\' 4"  (1.626 m), weight 260 lb 14.6 oz (118.348 kg), last menstrual period 01/27/2014, SpO2 94.00%. No results found. Results for orders placed during the hospital encounter of  02/14/14 (from the past 72 hour(s))  D-DIMER, QUANTITATIVE     Status: Abnormal   Collection Time    02/18/14  1:05 PM      Result Value Ref Range   D-Dimer, Quant 1.33 (*) 0.00 - 0.48 ug/mL-FEU   Comment:            AT THE INHOUSE ESTABLISHED CUTOFF     VALUE OF 0.48 ug/mL FEU,     THIS ASSAY HAS BEEN DOCUMENTED     IN THE LITERATURE TO HAVE     A SENSITIVITY AND NEGATIVE     PREDICTIVE VALUE OF AT LEAST     98 TO 99%.  THE TEST RESULT     SHOULD BE CORRELATED WITH     AN ASSESSMENT OF THE CLINICAL     PROBABILITY OF DVT / VTE.      Physical Exam  Nursing note and vitals reviewed.  Constitutional: She is oriented to person, place, and time. She appears well-developed and well-nourished.  Morbidly obese  HENT:  Tongue pierced Head: Normocephalic and atraumatic.  Eyes: Pupils are equal, round, and reactive to light.  Neck: Neck supple.  Cardiovascular: Normal rate and regular rhythm.  Respiratory: Effort normal and breath sounds normal. No respiratory distress. She has no wheezes.  GI: Soft. Bowel  sounds are normal. She exhibits no distension. There is no tenderness.  Musculoskeletal: She exhibits no edema and no tenderness.  Neurological: She is alert and oriented to person, place, and time.  Speech clear today. Able to follow commands without difficulty. Motor Strength --RUE 3-/5,RLE 2- HF, KE, trace ADF/PF. 5/5 on Left side. Sensory mildly diminished RLE Extremities- R calf more prominent but  Soft; slight pedal edema   Assessment/Plan: 1. Functional deficits secondary to MS exacerbation which require 3+ hours per day of interdisciplinary therapy in a comprehensive inpatient rehab setting. 2. DVT Prophylaxis/Anticoagulation: Pharmaceutical: Lovenox  3. Pain Management: She does not want to use neurontin to help with dysesthesias. Will schedule ultram. Oxycodone prn for severe pain.  4. Anxiety/depression/Mood: team to provide ego support. LCSW to follow for evaluation and  support.  5. Neuropsych: This patient is capable of making decisions on her own behalf.  6. Dysphagia/Throat pain: Will continue protonix. Continue modified diet.  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

## 2014-02-20 ENCOUNTER — Ambulatory Visit (HOSPITAL_COMMUNITY): Admission: RE | Admit: 2014-02-20 | Payer: Medicaid Other | Source: Ambulatory Visit

## 2014-02-20 ENCOUNTER — Inpatient Hospital Stay (HOSPITAL_COMMUNITY): Payer: Self-pay | Admitting: Speech Pathology

## 2014-02-20 ENCOUNTER — Encounter (HOSPITAL_COMMUNITY): Payer: Self-pay

## 2014-02-20 ENCOUNTER — Inpatient Hospital Stay (HOSPITAL_COMMUNITY): Payer: Medicaid Other

## 2014-02-20 ENCOUNTER — Inpatient Hospital Stay (HOSPITAL_COMMUNITY): Payer: Self-pay

## 2014-02-20 ENCOUNTER — Inpatient Hospital Stay (HOSPITAL_COMMUNITY): Payer: Medicaid Other | Admitting: Occupational Therapy

## 2014-02-20 DIAGNOSIS — G35 Multiple sclerosis: Secondary | ICD-10-CM

## 2014-02-20 MED ORDER — SENNOSIDES-DOCUSATE SODIUM 8.6-50 MG PO TABS
2.0000 | ORAL_TABLET | Freq: Every day | ORAL | Status: DC
  Administered 2014-02-20 – 2014-02-22 (×3): 2 via ORAL
  Filled 2014-02-20 (×3): qty 2

## 2014-02-20 NOTE — Progress Notes (Signed)
Speech Language Pathology Daily Session Note  Patient Details  Name: Mary [name] MRN: [id number] Date of Birth: 10/26/1975  Today's Date: 02/20/2014 Time: 9798-9211 Time Calculation (min): 42 min  Short Term Goals: Week 1: SLP Short Term Goal 1 (Week 1): Pt will utilize external memory aids to recall new, daily information with Mod I.  SLP Short Term Goal 2 (Week 1): Pt will use compensatory strategies to facilitate improved speech fluency at the conversation level (easy onset) with Mod I.  SLP Short Term Goal 3 (Week 1): Pt will utilize compensatory strategies to decrease globus sensation (alternate solids/liquids, small bites/sips) with currnet diet with Mod I.  Skilled Therapeutic Interventions:  Pt was seen for skilled speech therapy targeting dysphagia management.  SLP completed skilled observations during trials of regular solids to assess for diet advancement.  Pt was noted with effective mastication and oral clearance of solids, no overt s/s of aspiration with regular solids alone or with mixed solid and thin liquid consistencies.  Pt reported significantly improved globus sensation in comparison to initial bedside evaluation with the use of a thin liquid wash and small bites and sips.  SLP initiated education related to a diet upgrade to regular solids; however, pt reported that she would feel more comfortable advancing to dys 3 textures instead.  Therefore, recommend pt be upgraded to a dys 3 diet with thin liquids.  SLP provided extensive education related to dys 3 textures with the use of a handout to facilitate carryover between therapy sessions.  Pt verbalized understanding and all of her questions were answered to her satisfaction at this time.    FIM:  Comprehension Comprehension Mode: Auditory Comprehension: 7-Follows complex conversation/direction: With no assist Expression Expression Mode: Verbal Expression: 6-Expresses complex ideas: With extra time/assistive  device Social Interaction Social Interaction: 6-Interacts appropriately with others with medication or extra time (anti-anxiety, antidepressant). Problem Solving Problem Solving: 5-Solves complex 90% of the time/cues < 10% of the time Memory Memory: 7-Complete Independence: No helper FIM - Eating Eating Activity: 7: Complete independence:no helper  Pain Pain Assessment Pain Assessment: No/denies pain  Therapy/Group: Individual Therapy  Jackalyn Lombard, M.A. CCC-SLP  Nayzeth Altman, Melanee Spry 02/20/2014, 4:51 PM

## 2014-02-20 NOTE — Plan of Care (Signed)
Problem: RH PAIN MANAGEMENT Goal: RH STG PAIN MANAGED AT OR BELOW PT'S PAIN GOAL Pts goal less than or equal to 3  Outcome: Not Progressing Patient continues to rate pain > 3 after pain meds given.

## 2014-02-20 NOTE — Progress Notes (Signed)
Occupational Therapy Session Note  Patient Details  Name: Eusebio MeHaley M Brunkhorst MRN: [id number] Date of Birth: 09-21-1975  Today's Date: 02/20/2014 Time: 1300-1330 Time Calculation (min): 30 min  Short Term Goals: Week 1:  OT Short Term Goal 1 (Week 1): STG=LTG due to short ELOS  Skilled Therapeutic Interventions/Progress Updates: ADL-retraining with focus on tub bench transfer in standard tub, w/c mobility, and right UE strengthening.   Patient received in her room finishing using the toilet.   Using reacher and sock aid, patient donned both socks but was unable to don right shoe this session using shoe horn (donned with min assist).   Patient then self-propelled w/c to ADL apartment and with extra time performed one transfer from RW to tub bench and back with standby assist and back rest removed to facilitate return to edge of bench, after only one demonstration.   Patient confirmed need for tub bench at home and was escorted back to her room in her w/c.    Therapy Documentation Precautions:  Precautions Precautions: Fall Precaution Comments: right leg weakness Restrictions Weight Bearing Restrictions: No  Vital Signs: Therapy Vitals Temp: 97.9 F (36.6 C) Temp src: Oral Pulse Rate: 87 Resp: 18 BP: 106/71 mmHg Patient Position (if appropriate): Sitting Oxygen Therapy SpO2: 95 % O2 Device: None (Room air)  Pain: Pain Assessment Pain Assessment: 0-10 Pain Score: 4  Pain Type: Acute pain Pain Location: Back Pain Orientation: Mid;Upper Pain Descriptors / Indicators: Aching;Spasm Pain Onset: On-going Patients Stated Pain Goal: 4 Pain Intervention(s): Repositioned;Medication (See eMAR) Multiple Pain Sites: Yes 2nd Pain Site Pain Score: 8 Wong-Baker Pain Rating: Hurts whole lot Pain Type: Neuropathic pain Pain Location: Leg Pain Orientation: Right Pain Descriptors / Indicators: Burning Pain Frequency: Intermittent Pain Onset: Gradual Patient's Stated Pain Goal: 4 Pain  Intervention(s): Medication (See eMAR)  See FIM for current functional status  Therapy/Group: Individual Therapy  Mylan Schwarz 02/20/2014, 1:35 PM

## 2014-02-20 NOTE — Progress Notes (Signed)
38 y.o. female with h/o depression, chronic pancreatitis, MS diagnosed 08/2013 and on copaxone;who was admitted on 02/11/14 with numbness and weakness RLE progressing to RUE weakness and right facial numbness with stuttering speech. MRI brain stable without new lesions and MRI cervical spine without evidence of MS involvement. She was started on high dose solumedrol for MS exacerbation per neurology input and reported to have improvement in symptoms  Subjective/Complaints: Right side still weak. Sleeping well. Pleased with progress.  ROS:  Right side weak  Objective: Vital Signs: Blood pressure 108/71, pulse 86, temperature 98.3 F (36.8 C), temperature source Oral, resp. rate 18, height 5\' 4"  (1.626 m), weight 118.348 kg (260 lb 14.6 oz), last menstrual period 01/27/2014, SpO2 94.00%. No results found. Results for orders placed during the hospital encounter of 02/14/14 (from the past 72 hour(s))  D-DIMER, QUANTITATIVE     Status: Abnormal   Collection Time    02/18/14  1:05 PM      Result Value Ref Range   D-Dimer, Quant 1.33 (*) 0.00 - 0.48 ug/mL-FEU   Comment:            AT THE INHOUSE ESTABLISHED CUTOFF     VALUE OF 0.48 ug/mL FEU,     THIS ASSAY HAS BEEN DOCUMENTED     IN THE LITERATURE TO HAVE     A SENSITIVITY AND NEGATIVE     PREDICTIVE VALUE OF AT LEAST     98 TO 99%.  THE TEST RESULT     SHOULD BE CORRELATED WITH     AN ASSESSMENT OF THE CLINICAL     PROBABILITY OF DVT / VTE.      Physical Exam  Nursing note and vitals reviewed.  Constitutional: She is oriented to person, place, and time. She appears well-developed and well-nourished.  Morbidly obese  HENT:  Head: Normocephalic and atraumatic.  Eyes: Pupils are equal, round, and reactive to light.  Neck: Neck supple.  Cardiovascular: Normal rate and regular rhythm.  Respiratory: Effort normal and breath sounds normal. No respiratory distress. She has no wheezes.  GI: Soft. Bowel sounds are normal. She exhibits no  distension. There is no tenderness.  Musculoskeletal: She exhibits no edema and no tenderness.  Neurological: She is alert and oriented to person, place, and time.  Speech clear today. Able to follow commands without difficulty. Motor Strength --RUE 3-/5,RLE 2- HF.  KE 1+and ankles trace ADF/PF. 5/5 on Left side. Sensory mildly diminished RLE   Assessment/Plan: 1. Functional deficits secondary to MS exacerbation which require 3+ hours per day of interdisciplinary therapy in a comprehensive inpatient rehab setting. Physiatrist is providing close team supervision and 24 hour management of active medical problems listed below. Physiatrist and rehab team continue to assess barriers to discharge/monitor patient progress toward functional and medical goals.  FIM: FIM - Bathing Bathing Steps Patient Completed: Chest;Right Arm;Left Arm;Abdomen;Front perineal area;Buttocks;Right upper leg;Left upper leg;Right lower leg (including foot);Left lower leg (including foot) Bathing: 6: Assistive device (Comment) (Used LH sponge)  FIM - Upper Body Dressing/Undressing Upper body dressing/undressing steps patient completed: Thread/unthread right bra strap;Thread/unthread left bra strap;Thread/unthread right sleeve of pullover shirt/dresss;Thread/unthread left sleeve of pullover shirt/dress;Put head through opening of pull over shirt/dress;Pull shirt over trunk Upper body dressing/undressing: 7: Complete Independence: No helper FIM - Lower Body Dressing/Undressing Lower body dressing/undressing steps patient completed: Thread/unthread right pants leg;Thread/unthread left pants leg;Pull pants up/down;Thread/unthread right underwear leg;Thread/unthread left underwear leg;Pull underwear up/down;Don/Doff right sock;Don/Doff left sock;Fasten/unfasten pants Lower body dressing/undressing: 5: Supervision: Safety  issues/verbal cues (AE instruction)  FIM - Toileting Toileting steps completed by patient: Adjust clothing  prior to toileting;Performs perineal hygiene;Adjust clothing after toileting Toileting: 5: Supervision: Safety issues/verbal cues  FIM - ArchivistToilet Transfers Toilet Transfers Assistive Devices: Art gallery managerWalker Toilet Transfers: 6-To toilet/ BSC;6-From toilet/BSC  FIM - BankerBed/Chair Transfer Bed/Chair Transfer Assistive Devices: Therapist, occupationalWalker Bed/Chair Transfer: 5: Supine > Sit: Supervision (verbal cues/safety issues);5: Bed > Chair or W/C: Supervision (verbal cues/safety issues)  FIM - Locomotion: Wheelchair Locomotion: Wheelchair: 5: Travels 150 ft or more: maneuvers on rugs and over door sills with supervision, cueing or coaxing FIM - Locomotion: Ambulation Locomotion: Ambulation Assistive Devices: Designer, industrial/productWalker - Rolling Ambulation/Gait Assistance: 5: Supervision Locomotion: Ambulation: 5: Travels 150 ft or more with supervision/safety issues  Comprehension Comprehension Mode: Auditory Comprehension: 7-Follows complex conversation/direction: With no assist  Expression Expression Mode: Verbal Expression: 6-Expresses complex ideas: With extra time/assistive device  Social Interaction Social Interaction: 6-Interacts appropriately with others with medication or extra time (anti-anxiety, antidepressant).  Problem Solving Problem Solving: 5-Solves complex 90% of the time/cues < 10% of the time  Memory Memory: 5-Recognizes or recalls 90% of the time/requires cueing < 10% of the time  Medical Problem List and Plan:  1. Functional deficits secondary to MS exacerbation.  2. DVT Prophylaxis/Anticoagulation: Pharmaceutical: Lovenox  3. Pain Management:  schedulde ultram effective. Oxycodone prn for severe pain.   -discussed gabapentin use also (doesn't want) 4. Anxiety/depression/Mood: team to provide ego support. LCSW to follow for evaluation and support.  5. Neuropsych: This patient is capable of making decisions on her own behalf.  6. Dysphagia/Throat pain: Will continue protonix. Continue modified diet.  7.   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

## 2014-02-20 NOTE — Consult Note (Signed)
INITIAL DIAGNOSTIC EVALUATION - [facility]   MEDICAL NECESSITY:  Mary [name] was seen on the [facility] Unit for an initial diagnostic evaluation owing to the patient's diagnosis of multiple sclerosis.   According to medical records, Mary [name] was admitted to the rehab unit owing to "Functional deficits secondary to MS exacerbation." She reportedly has a history of depression, chronic pancreatitis, and MS (diagnosed circa January 2015). She takes copaxone. She was reportedly admitted on 02/11/14 with numbness and weakness in her RLE that progressed to RUE along with right facial numbness with stuttering speech. A brain MRI scan was reportedly stable without new lesions and MRI cervical spine without evidence of MS involvement. She was started on high dose solumedrol for MS exacerbation.   During today's visit, Mary [name] reported suffering from cognitive issues such as memory loss for recent information, poor sustained attention, and slowed processing speed. She also said that since her MS exacerbation she started stuttering. She experiences fluctuating fatigue but this has not been a barrier to therapy.   From an emotional standpoint, Mary [name] denied ever being diagnosed with a mental health disorder. She describes herself as OCD though based on her self-report this is an unlikely diagnosis. She suffers a lot of anxiety since receiving her MS diagnosis as well as some depressive type symptoms and has recently been prescribed a medication for mood control (name unknown). She is admittedly still in denial about her MS diagnosis but became tearful when speaking of the topic. No adjustment issues endorsed. No barriers to therapy identified. Suicidal/homicidal ideation, plan or intent was denied. No manic or hypomanic episodes were reported. The patient denied ever experiencing any auditory/visual hallucinations. No major behavioral or  personality changes were endorsed.   In regard to therapy and staff, Mary [name] described the rehab staff as "awesome." She notices gains that she is making in therapy. She has a decent social support system that includes her mother and a few friends. She was not working prior to her admission and she is in the processing of applying for disability benefits.   PROCEDURES ADMINISTERED: [1 unit [id number] on 02/20/14] Diagnostic clinical interview  Review of available records  Behavioral Evaluation: Mary [name] was appropriately dressed for season and situation, and she appeared tidy and well-groomed. Normal posture was noted. She was friendly and rapport was easily established. Her speech was stuttered but she was able to express ideas effectively. Her affect was appropriately modulated. Attention and motivation were good.    Overall, [name] reportedly is suffering from multiple cognitive and physical issues that seem to be related to her MS diagnosis. She is adjusting well to this admission. Emotionally, she is experiencing a normal adjustment reaction to her present situation and is being appropriately treated. I recommended that she seek out support groups upon discharge and consider a comprehensive neuropsychological evaluation. I will follow-up with her in the meantime while she is on the unit.    RECOMMENDATIONS    Brief counseling for social support. Please place her on my schedule for next Monday if she is still on the unit.    Continued medical management of mood.    Please provide her with information on setting up a neuropsych evaluation upon discharge.    Provide information on MS society. I will provide informative pamphlets and have my colleague bring them to her on Thursday.   DIAGNOSES:  Multiple sclerosis Adjustment disorder with mixed depression and anxiety   Mary Doby T.  Mary [name], Psy.D.  Clinical Neuropsychologist

## 2014-02-20 NOTE — Progress Notes (Signed)
Occupational Therapy Session Note  KD:XIPJASN Details  Name: Mary [name] MRN: [id number] Date of Birth: [birth year]  Today's Date: 02/20/2014 Time: 0901-1001 Time Calculation (min): 60 min  Short Term Goals: Week 1:  OT Short Term Goal 1 (Week 1): STG=LTG due to short ELOS  Skilled Therapeutic Interventions/Progress Updates:      Pt seen for BADL retraining of toileting, bathing at shower level, and dressing with a focus on use of AE, energy conservation, balance. Pt was eager to have more privacy with her self care routine. Pt encouraged to work as if she was alone in the room, but aware that this clinician was here to assist as needed. Pt ambulated around the room to gather her clothing and ambulated to toilet to doff clothing, ambulated to shower , showered and then dressed all without supervision required. Due to wet floor from walk in shower, pt needed assist to have floor dried and S to get out of shower.  Pt was able to stand at sink and brush teeth. Pt fatigued at end of session, and wanted to get back in bed. Overall mod I with self care and S with transfers. Pt provided with walker bags and discussed the need for her to practice tub bench transfers in ADL apartment. Pt resting in room with all needs met.    Therapy Documentation Precautions:  Precautions Precautions: Fall Precaution Comments: right leg weakness Restrictions Weight Bearing Restrictions: No      Pain: Pt stated she continues to have RLE pain, but had just received pain meds prior to start of OT session.  ADL:  See FIM for current functional status  Therapy/Group: Individual Therapy  [place] 02/20/2014, 11:28 AM

## 2014-02-20 NOTE — Progress Notes (Signed)
Recreational Therapy Assessment and Plan  Patient Details  Name: Mary [name] MRN: [id number] Date of Birth: 12-25-1975 Today's Date: 02/20/2014  Rehab Potential: Good   ELOS: 7 days  Assessment Clinical Impression: Problem List:  Patient Active Problem List    Diagnosis  Date Noted   .  Exacerbation of multiple sclerosis  02/14/2014   .  Thrush, oral  02/12/2014   .  GERD (gastroesophageal reflux disease)  02/11/2014   .  Multiple sclerosis exacerbation  02/11/2014   .  Relapsing remitting multiple sclerosis  09/12/2013   .  Abdominal pain  07/26/2013   .  Nausea and vomiting in adult  07/26/2013   .  Low serum cortisol level  04/25/2013   .  ? Adrenal insufficiency- Work up negative  04/23/2013   .  Chronic pancreatitis with chronic pseudocysts  04/23/2013   .  Abnormal MRI  04/23/2013   .  Persistent vomiting  04/22/2013   .  Diarrhea  04/21/2013   .  Anemia  04/20/2013   .  Protein-calorie malnutrition, severe  04/16/2013   .  Obesity, morbid  03/15/2013   .  Pancreatic cyst  03/15/2013   .  history of recurrent ideopathic pancreatitis  03/15/2013    Past Medical History:  Past Medical History   Diagnosis  Date   .  Hernia  03-24-13     ventral hernia remains   .  GERD (gastroesophageal reflux disease)    .  Pancreatitis  03-24-13     2002, 2'2013, 03-14-13   .  Pancreatic cyst  2002 onset   .  Anxiety    .  Depression      "recent breakup with partner of 15 yrs"   .  Adrenal insufficiency  04/23/2013     ??   .  MS (multiple sclerosis)     Past Surgical History:  Past Surgical History   Procedure  Laterality  Date   .  Abdominal surgery   ~ 2007     some sort of pancreatic cyst drainage.   .  Cholecystectomy       ~ age 38-laparoscopic   .  Adenoidectomy     .  Roux-en-y procedure       stent to pancreatic cyst that became infected within 37 hours   .  Eus  N/A  04/14/2013     Procedure: UPPER ENDOSCOPIC ULTRASOUND (EUS) LINEAR; Surgeon: Milus Banister, MD;  Location: [facility]; Service: Endoscopy; Laterality: N/A;    Assessment & Plan  Clinical Impression: Patient is a 38 y.o. year old female with h/o depression, chronic pancreatitis, MS diagnosed 08/2013 and on copaxone;who was admitted on 02/11/14 with numbness and weakness RLE progressing to RUE weakness and right facial numbness with stuttering speech. MRI brain stable without new lesions and MRI cervical spine without evidence of MS involvement. She was started on high dose solumedrol for MS exacerbation per neurology input and reported to have improvement in symptoms. Patient placed on liquid diet due to dicomfort with solids but willing to be advanced to dysphagia 2--SLP questions anxiety v/s esophageal phase issues.  Patient transferred to CIR on 02/14/2014.  Pt presents with decreased activity tolerance, decreased functional mobility, decreased balance, decreased coordination Limiting pt's independence with leisure/community pursuits.  Leisure History/Participation Premorbid leisure interest/current participation: Medical laboratory scientific officer - Building control surveyor - Doctor, hospital - Travel (Comment);Crafts - Other (Comment);Sports - Swimming Other Leisure Interests: Television;Movies;Reading;Computer;Cooking/Baking;Housework Leisure Participation Style: With Family/Friends Awareness of Community Resources:  Excellent Psychosocial / Spiritual Stress Management:  (reports a lot of anxiety) Patient agreeable to Pet Therapy: Yes Does patient have pets?: Yes (mom has 3 pitt bulls) Social interaction - Mood/Behavior: Cooperative Academic librarian Appropriate for Education?: Yes Recreational Therapy Orientation Orientation -Reviewed with patient: Available activity resources Strengths/Weaknesses Patient Strengths/Abilities: Willingness to participate;Active premorbidly Patient weaknesses: Physical limitations TR Patient demonstrates impairments in the following area(s):  Endurance;Motor;Safety  Plan Rec Therapy Plan Is patient appropriate for Therapeutic Recreation?: Yes Rehab Potential: Good Treatment times per week: min 1 time per week >20 minutes Estimated Length of Stay: 7 days TR Treatment/Interventions: Adaptive equipment instruction;1:1 session;Balance/vestibular training;Functional mobility training;Community reintegration;Patient/family education;Therapeutic activities;Recreation/leisure participation;Therapeutic exercise;UE/LE Coordination activities Recommendations for other services: Neuropsych  Recommendations for other services: Neuropsych  Discharge Criteria: Patient will be discharged from TR if patient refuses treatment 3 consecutive times without medical reason.  If treatment goals not met, if there is a change in medical status, if patient makes no progress towards goals or if patient is discharged from hospital.  The above assessment, treatment plan, treatment alternatives and goals were discussed and mutually agreed upon: by patient  [place] 02/20/2014, 2:20 PM

## 2014-02-20 NOTE — Progress Notes (Signed)
Physical Therapy Session Note  Patient Details  Name: Mary [name] MRN: [id number] Date of Birth: [birth year]  Today's Date: 02/20/2014 Time: [id number] Time Calculation (min): 57 min  Short Term Goals: Week 1:    STG=LTGs due to ELOS   Skilled Therapeutic Interventions/Progress Updates:    Pt received seated in w/c and agreeable to participate in therapy. Pt propelled w/c 150' to gym w/ distant supervision, managed w/c parts with supervision. Session today focused on gait training and AFO education. Pt trialed w/ WalkOn Reaction AFO on RLE. During first walk w/ AFO, pt demonstrated increased knee flexion during swing but continued to demonstrate toe drag. Completed PNF D1 supine w/ on RLE w/ rhythmic initiation and min resistance for increased RLE activation. On second walk w/ AFO after PNF, pt began clearing toe consistently. Pt expressed that she felt her gait improved with use of AFO. Pt demonstrated good carryover w/ gait w/ increased toe clearance and decreased foot drag w/ ambulation w/o AFO 150' w/ RW. Discussed discharge equipment w/ patient and she expressed strong desire to have access to wheelchair after discharge. Communicated this to pt's discharge planner. Pt left supine in bed w/ all needs within reach.  Therapy Documentation Precautions:  Precautions Precautions: Fall Precaution Comments: right leg weakness Restrictions Weight Bearing Restrictions: No General:   Vital Signs: Therapy Vitals Temp: 97.9 F (36.6 C) Temp src: Oral Pulse Rate: 87 Resp: 18 BP: 106/71 mmHg Patient Position (if appropriate): Sitting Oxygen Therapy SpO2: 95 % O2 Device: None (Room air) Pain: Pain Assessment Pain Assessment: 0-10 Pain Score: 6  Pain Type: Neuropathic pain Pain Location: Leg Pain Orientation: Right Pain Descriptors / Indicators: Shooting Pain Onset: On-going Patients Stated Pain Goal: 0 Pain Intervention(s): RN made aware Multiple Pain Sites: Yes 2nd Pain  Site Pain Score: 8 Wong-Baker Pain Rating: Hurts whole lot Pain Type: Neuropathic pain Pain Location: Leg Pain Orientation: Right Pain Descriptors / Indicators: Burning Pain Frequency: Intermittent Pain Onset: Gradual Patient's Stated Pain Goal: 4 Pain Intervention(s): Medication (See eMAR) Locomotion : Ambulation Ambulation/Gait Assistance: 5: Supervision   See FIM for current functional status  Therapy/Group: Individual Therapy  Hosie SpangleGodfrey, Temiloluwa Recchia Hosie SpangleJess Carlton Sweaney, PT, DPT  02/20/2014, 3:35 PM

## 2014-02-21 ENCOUNTER — Ambulatory Visit (HOSPITAL_COMMUNITY): Payer: Self-pay | Admitting: Speech Pathology

## 2014-02-21 ENCOUNTER — Inpatient Hospital Stay (HOSPITAL_COMMUNITY): Payer: Medicaid Other | Admitting: *Deleted

## 2014-02-21 ENCOUNTER — Inpatient Hospital Stay (HOSPITAL_COMMUNITY): Payer: Medicaid Other | Admitting: Physical Therapy

## 2014-02-21 ENCOUNTER — Ambulatory Visit: Payer: Self-pay | Admitting: Neurology

## 2014-02-21 ENCOUNTER — Inpatient Hospital Stay (HOSPITAL_COMMUNITY): Payer: Medicaid Other | Admitting: Occupational Therapy

## 2014-02-21 MED ORDER — GLATIRAMER ACETATE 40 MG/ML ~~LOC~~ SOSY
40.0000 mg | PREFILLED_SYRINGE | SUBCUTANEOUS | Status: DC
  Administered 2014-02-22: 40 mg via SUBCUTANEOUS

## 2014-02-21 NOTE — Progress Notes (Signed)
Occupational Therapy Session Note  Patient Details  Name: Mary [name] MRN: [id number] Date of Birth: 1975-10-19  Today's Date: 02/21/2014 Time: 0902-1002 Time Calculation (min): 60 min  Short Term Goals: Week 1:  OT Short Term Goal 1 (Week 1): STG=LTG due to short ELOS      Skilled Therapeutic Interventions/Progress Updates:      Pt seen for BADL retraining of toileting, bathing, and dressing with a focus on safe functional mobility and use of AE.  Pt was already in bathroom at start of session. She used RW to ambulate around her room to gather clothing from the closet and drawers. Pt ambulated back to toilet to doff clothing. She then completed her walk in shower transfers and returned to toilet without supervision needed. Pt used AE without difficulty to don clothing. Pt gathered her clothing into laundry bag and propelled chair down to laundry room to start laundry. The washer was not available at that time. Overall, pt was safe and did not have any LOB. She may be mod I in her room starting today.  Therapy Documentation Precautions:  Precautions Precautions: Fall Precaution Comments: right leg weakness Restrictions Weight Bearing Restrictions: No       Pain: Pain Assessment Pain Assessment: 0-10 Pain Score: 8  Pain Type: Chronic pain Pain Location: Back Pain Descriptors / Indicators: Aching Pain Onset: With Activity Pain Intervention(s): Nurse notified and provided her with medication.  ADL:  See FIM for current functional status  Therapy/Group: Individual Therapy  SAGUIER,JULIA 02/21/2014, 10:23 AM

## 2014-02-21 NOTE — Progress Notes (Signed)
Recreational Therapy Session Note  Patient Details  Name: Eusebio MeHaley M Wengert MRN: [id number] Date of Birth: 02/04/1976 Today's Date: 02/21/2014  Pain: no c/o Skilled Therapeutic Interventions/Progress Updates: session focused on activity tolerance, standing balance & tolerance, meal planning.  Pt propelled w/c on unit using BUE's with Mod I to laundry room.  Pt stood with supervision to retrieve items out of washer & place in dryer. Discussed simple meal planning session with pt seated in w/c with mod I.  Therapy/Group: Individual Therapy  Derren Suydam 02/21/2014, 4:14 PM

## 2014-02-21 NOTE — Progress Notes (Signed)
Ortho tech called and ordered Rt AFO brace per order

## 2014-02-21 NOTE — Progress Notes (Signed)
38 y.o. female with h/o depression, chronic pancreatitis, MS diagnosed 08/2013 and on copaxone;who was admitted on 02/11/14 with numbness and weakness RLE progressing to RUE weakness and right facial numbness with stuttering speech. MRI brain stable without new lesions and MRI cervical spine without evidence of MS involvement. She was started on high dose solumedrol for MS exacerbation per neurology input and reported to have improvement in symptoms  Subjective/Complaints: Happy with progress and therapy efforts. Had a good day yesterday. Tried AFO  ROS:  Right side weak, numb  Objective: Vital Signs: Blood pressure 107/74, pulse 82, temperature 98 F (36.7 C), temperature source Oral, resp. rate 18, height 5\' 4"  (1.626 m), weight 118.348 kg (260 lb 14.6 oz), last menstrual period 01/27/2014, SpO2 94.00%. No results found. Results for orders placed during the hospital encounter of 02/14/14 (from the past 72 hour(s))  D-DIMER, QUANTITATIVE     Status: Abnormal   Collection Time    02/18/14  1:05 PM      Result Value Ref Range   D-Dimer, Quant 1.33 (*) 0.00 - 0.48 ug/mL-FEU   Comment:            AT THE INHOUSE ESTABLISHED CUTOFF     VALUE OF 0.48 ug/mL FEU,     THIS ASSAY HAS BEEN DOCUMENTED     IN THE LITERATURE TO HAVE     A SENSITIVITY AND NEGATIVE     PREDICTIVE VALUE OF AT LEAST     98 TO 99%.  THE TEST RESULT     SHOULD BE CORRELATED WITH     AN ASSESSMENT OF THE CLINICAL     PROBABILITY OF DVT / VTE.      Physical Exam  Nursing note and vitals reviewed.  Constitutional: She is oriented to person, place, and time. She appears well-developed and well-nourished.  Morbidly obese  HENT:  Head: Normocephalic and atraumatic.  Eyes: Pupils are equal, round, and reactive to light.  Neck: Neck supple.  Cardiovascular: Normal rate and regular rhythm.  Respiratory: Effort normal and breath sounds normal. No respiratory distress. She has no wheezes.  GI: Soft. Bowel sounds are  normal. She exhibits no distension. There is no tenderness.  Musculoskeletal: She exhibits no edema and no tenderness.  Neurological: She is alert and oriented to person, place, and time.  Speech clear today. Able to follow commands without difficulty. Motor Strength --RUE 3-/5,RLE 2- HF.  KE 1+and ankles trace ADF/PF. 5/5 on Left side. Sensory mildly diminished RLE   Assessment/Plan: 1. Functional deficits secondary to MS exacerbation which require 3+ hours per day of interdisciplinary therapy in a comprehensive inpatient rehab setting. Physiatrist is providing close team supervision and 24 hour management of active medical problems listed below. Physiatrist and rehab team continue to assess barriers to discharge/monitor patient progress toward functional and medical goals.  FIM: FIM - Bathing Bathing Steps Patient Completed: Chest;Right Arm;Left Arm;Abdomen;Front perineal area;Buttocks;Right upper leg;Left upper leg;Right lower leg (including foot);Left lower leg (including foot) Bathing: 6: Assistive device (Comment) (long handled sponge)  FIM - Upper Body Dressing/Undressing Upper body dressing/undressing steps patient completed: Thread/unthread right bra strap;Thread/unthread left bra strap;Thread/unthread right sleeve of pullover shirt/dresss;Thread/unthread left sleeve of pullover shirt/dress;Put head through opening of pull over shirt/dress;Pull shirt over trunk Upper body dressing/undressing: 7: Complete Independence: No helper FIM - Lower Body Dressing/Undressing Lower body dressing/undressing steps patient completed: Thread/unthread right pants leg;Thread/unthread left pants leg;Pull pants up/down;Thread/unthread right underwear leg;Thread/unthread left underwear leg;Pull underwear up/down;Don/Doff right sock;Don/Doff left sock;Fasten/unfasten pants Lower  body dressing/undressing: 6: Assistive device (Comment) (reacher)  FIM - Toileting Toileting steps completed by patient: Adjust  clothing prior to toileting;Performs perineal hygiene;Adjust clothing after toileting Toileting: 6: More than reasonable amount of time  FIM - Diplomatic Services operational officer Devices: Art gallery manager Transfers: 6-To toilet/ BSC;6-From toilet/BSC  FIM - Banker Devices: Environmental consultant;Arm rests Bed/Chair Transfer: 6: Supine > Sit: No assist;6: Sit > Supine: No assist;5: Bed > Chair or W/C: Supervision (verbal cues/safety issues)  FIM - Locomotion: Wheelchair Locomotion: Wheelchair: 5: Travels 150 ft or more: maneuvers on rugs and over door sills with supervision, cueing or coaxing FIM - Locomotion: Ambulation Locomotion: Ambulation Assistive Devices: Walker - Rolling;Orthosis (AFO) Ambulation/Gait Assistance: 5: Supervision Locomotion: Ambulation: 5: Travels 150 ft or more with supervision/safety issues  Comprehension Comprehension Mode: Auditory Comprehension: 7-Follows complex conversation/direction: With no assist  Expression Expression Mode: Verbal Expression: 6-Expresses complex ideas: With extra time/assistive device  Social Interaction Social Interaction: 6-Interacts appropriately with others with medication or extra time (anti-anxiety, antidepressant).  Problem Solving Problem Solving: 5-Solves complex 90% of the time/cues < 10% of the time  Memory Memory: 7-Complete Independence: No helper  Medical Problem List and Plan:  1. Functional deficits secondary to MS exacerbation.  2. DVT Prophylaxis/Anticoagulation: Pharmaceutical: Lovenox  3. Pain Management:  schedulde ultram effective. Oxycodone prn for severe pain.   -discussed gabapentin use also (doesn't want) 4. Anxiety/depression/Mood: team to provide ego support. LCSW to follow for evaluation and support.  5. Neuropsych: This patient is capable of making decisions on her own behalf.  6. Dysphagia/Throat pain: Will continue protonix. Continue modified diet.  7.  Neurogenic  stuttering consider SSRI for ?anxiety----speech functional with me today LOS (Days) 7 A FACE TO FACE EVALUATION WAS PERFORMED  Ronson Hagins T 02/21/2014, 8:50 AM

## 2014-02-21 NOTE — Progress Notes (Signed)
Speech Language Pathology Daily Session Note  Patient Details  Name: Mary [name] MRN: [id number] Date of Birth: [birth year]  Today's Date: 02/21/2014 Time: 6010-9323 Time Calculation (min): 42 min  Short Term Goals: Week 1: SLP Short Term Goal 1 (Week 1): Pt will utilize external memory aids to recall new, daily information with Mod I.  SLP Short Term Goal 2 (Week 1): Pt will use compensatory strategies to facilitate improved speech fluency at the conversation level (easy onset) with Mod I.  SLP Short Term Goal 3 (Week 1): Pt will utilize compensatory strategies to decrease globus sensation (alternate solids/liquids, small bites/sips) with currnet diet with Mod I.  Skilled Therapeutic Interventions:  Pt was seen for skilled speech therapy targeting dysphagia management and ongoing education.  Pt was upright in wheelchair upon arrival with reports of good diet tolerance since yesterday's diet upgrade to dys 3 consistencies.  SLP completed skilled observations with presentations of pt's currently prescribed diet with pt exhibiting no overt s/s of aspiration or complaints of globus sensation.  Pt independently utilized recommendations to alternate solids and liquids and take small bites/sips during the entirety of her meal in the presence of distractions.  Pt also reported that she was ready to advance to regular solids.  Given pt's effective oral manipulation and clearance of regular textures during yesterday's therapy session and pt exhibiting no overt s/s of aspiration across any consistencies assessed, recommend that pt be upgraded to regular solids with continued thin liquids.  Additionally, pt was >80% fluent during functional conversations and reports overall improved speech fluency.  Pt also endorses occasional "forgetfulness" for which she would benefit from skilled education prior to discharge; however, pt will likely not need continued speech therapy at discharge as she is largely returned to  her cognitive baseline.    FIM:  Comprehension Comprehension Mode: Auditory Comprehension: 7-Follows complex conversation/direction: With no assist Expression Expression Mode: Verbal Expression: 6-Expresses complex ideas: With extra time/assistive device Social Interaction Social Interaction: 6-Interacts appropriately with others with medication or extra time (anti-anxiety, antidepressant). Problem Solving Problem Solving: 5-Solves complex 90% of the time/cues < 10% of the time Memory Memory: 6-More than reasonable amt of time FIM - Eating Eating Activity: 6: Modified consistency diet: (comment)  Pain Pain Assessment Pain Assessment: No/denies pain  Therapy/Group: Individual Therapy  Jackalyn Lombard, M.A. CCC-SLP  Kyana Aicher, Melanee Spry 02/21/2014, 3:37 PM

## 2014-02-21 NOTE — Progress Notes (Signed)
Physical Therapy Session Note  Patient Details  Name: WILLMA RAMPINO MRN: [id number] Date of Birth: 1976/07/05  Today's Date: 02/21/2014 Time: Tx 1: 10:15 - 11:20; Tx 2: 1450-1520 Time Calculation (min): Tx 1: 60 minutes; Tx 2: 30 min  Short Term Goals: Week 1:STG = LTG     Skilled Therapeutic Interventions/Progress Updates:    Tx 1: Gait Training: PT dons R AFO for dorsiflexion assist and shoe covering for reduced friction and pt ambulates 150' with CGA for safety and cues for upright posture, shoulder depression, and increased weight shift to the L for improved R LE progression during swing phase. Pt's gait changes from step-to to step-through with implementation of these cues.   W/C Management & Propulsion: PT instructs pt in propelling w/c x 150' with B UE and pt demonstrates Independent management of w/c parts including brakes and legrests.   Neuro Reeducation:  PT instructs pt in training mode of Bioness L300 for R ankle DF only: intensity set at 50, 200 microseconds phase duration, and 30 Hz pulse rate, symmetric waveform. Pt's best ankle DF and slight eversion occur with infrapatellar cuff placed more right lateral to the patella than the cutout naturally conforms to - pt allowed PT to place pen mark on knee for ease of finding to set up faster for next treatment.   Tx 2: Therapeutic Exercise: PT sets pt up on Bioness L300 for active assist R ankle DF exercise: intensity set at 50, 2 second ramp up, 2 second ramp down, 5 second on phase, 5 second off phase, symmetric waveform, 200 microseconds phase duration; 30 Hz pulse rate: x 30 reps.   Gait Training: PT sets pt up on Bioness L300 for ambulation with neuroprosthesis to aide in R ankle DF: intensity set at 53, 0.2 ramp up, 0.2 ramp down, 10% extended. Pt ambulates with FWW and demonstrates excellent R LE foot clearance and step-through gait, cues required for upright posture as pt still has a tendency to watch her feet as she  walks. No LOB.   Pt is set up as mod I in her room to give her a trial of independence prior to discharge. Orthotist from [employer] has been contacted to do an AFO fitting for pt during tomorrow's treatment session.   Therapy Documentation Precautions:  Precautions Precautions: Fall Precaution Comments: right leg weakness Restrictions Weight Bearing Restrictions: No    Pain: Tx 1 Pain Assessment Pain Assessment: No/denies pain Pain Score: 7  Pain Type: Chronic pain Pain Location: Back Pain Onset: Awakened from sleep Pain Intervention(s): Medication (See eMAR) Tx 2: No c/o pain Locomotion : Ambulation Ambulation/Gait Assistance: 5: Supervision Wheelchair Mobility Distance: 150   See FIM for current functional status  Therapy/Group: Individual Therapy  Smaran Gaus M 02/21/2014, 3:29 PM

## 2014-02-22 ENCOUNTER — Inpatient Hospital Stay (HOSPITAL_COMMUNITY): Payer: Medicaid Other | Admitting: Occupational Therapy

## 2014-02-22 ENCOUNTER — Inpatient Hospital Stay (HOSPITAL_COMMUNITY): Payer: Medicaid Other | Admitting: Speech Pathology

## 2014-02-22 ENCOUNTER — Inpatient Hospital Stay (HOSPITAL_COMMUNITY): Payer: Medicaid Other | Admitting: Physical Therapy

## 2014-02-22 ENCOUNTER — Inpatient Hospital Stay (HOSPITAL_COMMUNITY): Payer: Self-pay | Admitting: *Deleted

## 2014-02-22 DIAGNOSIS — G35 Multiple sclerosis: Secondary | ICD-10-CM

## 2014-02-22 NOTE — Progress Notes (Signed)
Speech Language Pathology Daily Session Note  Patient Details  Name: Mary [name] MRN: [id number] Date of Birth: [birth year]  Today's Date: 02/22/2014 Time: 1430-1500 Time Calculation (min): 30 min  Short Term Goals: Week 1: SLP Short Term Goal 1 (Week 1): Pt will utilize external memory aids to recall new, daily information with Mod I.  SLP Short Term Goal 2 (Week 1): Pt will use compensatory strategies to facilitate improved speech fluency at the conversation level (easy onset) with Mod I.  SLP Short Term Goal 3 (Week 1): Pt will utilize compensatory strategies to decrease globus sensation (alternate solids/liquids, small bites/sips) with currnet diet with Mod I.  Skilled Therapeutic Interventions: Skilled treatment session focused on patient education. SLP facilitated session with a functional conversation that focused on memory compensatory strategies and ways to incorporate them into her daily routine to increase recall and carryover of new information at discharge. Pt verbalized understanding and gave appropriate examples of utilization with Mod I. Handout was also given to reinforce information. Pt was >90% fluent throughout the functional conversation and reported overall improved speech fluency. Pt will discharge home tomorrow.    FIM:  Comprehension Comprehension Mode: Auditory Comprehension: 6-Follows complex conversation/direction: With extra time/assistive device Expression Expression Mode: Verbal Expression: 6-Expresses complex ideas: With extra time/assistive device Social Interaction Social Interaction: 6-Interacts appropriately with others with medication or extra time (anti-anxiety, antidepressant). Problem Solving Problem Solving: 6-Solves complex problems: With extra time Memory Memory: 6-More than reasonable amt of time  Pain Pain Assessment Pain Assessment: 0-10 Pain Score: 8  Faces Pain Scale: Hurts even more Pain Type: Chronic pain Pain Location: Leg Pain  Frequency: Constant Pain Intervention(s): Medication (See eMAR)  Therapy/Group: Individual Therapy  [name] 02/22/2014, 3:27 PM

## 2014-02-22 NOTE — Progress Notes (Signed)
Occupational Therapy Session Note  Patient Details  Name: Mary [name] MRN: [id number] Date of Birth: Jun 19, 1976  Today's Date: 02/22/2014 Time: 4010-2725 and 1035-1100 Time Calculation (min): 67 min and 25 min  Short Term Goals: No short term goals set  Skilled Therapeutic Interventions/Progress Updates:    Visit 1: Pt was receiving pain medication from nursing at start of session. Pt seen this session for ADL retraining in ADL apartment to allow pt to practice with a tub bench in a tub which she will be using at home.  Pt gathered her clothing in her room using her walker and then propelled herself in her w/c down to ADL apartment. Once in the bathroom, pt completed all of her self care at a mod I level using AE and DME.   Pt stated that she was able to complete her laundry tasks yesterday.  Visit 2: Pain:  Pt c/o pain at the end of the session and asked for pain medication at that time.  Nursing informed. Pt seen this session to address IADL skill of simple meal prep and housekeeping. Pt was able to use her RW in kitchen and retrieve items from refrigerator to make scrambled eggs and cheese. She was able to reach in cabinets and cupboards to retrieve necessary items. Used sink to wash out bowl.  Pt stood for the entire process. Pt encouraged to take a break for energy conservation, but she responded "I just want to get through this". Pt will be safe at home to independently prepare herself a light meal and clean up the kitchen. Pt taken to the gym for her PT session.  Therapy Documentation Precautions:  Precautions Precautions: Fall Precaution Comments: right leg weakness Restrictions Weight Bearing Restrictions: No  Vital Signs:    Pain Assessment Pain Assessment: 0-10 Pain Score: 9  Pain Type: Chronic pain Pain Location: Leg Pain Frequency: Intermittent Pain Intervention(s): Medication (See eMAR) ADL:  See FIM for current functional status  Therapy/Group: Individual  Therapy  SAGUIER,JULIA 02/22/2014, 11:14 AM

## 2014-02-22 NOTE — Progress Notes (Signed)
Recreational Therapy Session Note  Patient Details  Name: ADELAE WEGMAN MRN: [id number] Date of Birth: Feb 17, 1976 Today's Date: 02/22/2014  Pain: c/o pain, unrated, RN made awareu Skilled Therapeutic Interventions/Progress Updates: Session focused on energy conservation, functional mobility in kitchen for simple meal prep.  Pt stating today that she didn't feel well, in pain & limited sleep last night.  Encouraged pt to take rest breaks as needed but pt stated she just wanted to get through the task.  Limited education due to pt's mood stating she didn't want to play 20 questions.    Africa Masaki 02/22/2014, 5:47 PM

## 2014-02-22 NOTE — Progress Notes (Signed)
Physical Therapy Discharge Summary  Patient Details  Name: Mary [name] MRN: [id number] Date of Birth: [birth year]  Today's Date: 02/22/2014 Time: 1100-1205 Time Calculation (min): 65 min  Patient has met 8 of 8 long term goals due to improved activity tolerance, improved balance, improved postural control, increased strength, ability to compensate for deficits, improved coordination and obtaining appropriate R AFO.  Patient to discharge at a wheelchair level Modified Independent.   Patient's care partner is independent to provide the necessary physical assistance at discharge.  PT Interventions: Orthotic Fit/Training: Chris from [employer] assessed pt and gave recommendations for anterior shin block AFO and instructed pt in using shoe button for better fit of AFO. Gerald Stabs also took pt's shoe to add a toe cap to reduce friction during gait.   Therapeutic Activity: Functional mobility reassessed: pt is mod I in bed mobility with bedrail (pt plans to get one at d/c), mod I in transfers w/c to bed: stand-pivot on the L side of the bed and superman transfer on the R side of the bed, car transfers require Supervision with cues for safe hand holds and technique. Pt is able to ambulate mod I with RW x 50' - endurance limited by fatigue.   W/C Management: Pt demonstrates safe w/c propulsion mod I x 200' with B UE. PT placed order with MSW for appropriate size w/c specifications for rent at d/c. Pt will benefit from rental w/c due to fatigue with activity and progressive nature of MS.   Recommendation:  Patient will benefit from ongoing skilled PT services in outpatient setting to continue to advance safe functional mobility, address ongoing impairments in strength, ambulation, balance, activity tolerance, and minimize fall risk.  Equipment: 20x16 manual w/c with Ulice Dash Basic cushion  Reasons for discharge: treatment goals met  Patient/family agrees with progress made and goals achieved: Yes  PT  Discharge Precautions/Restrictions Precautions Precautions: Fall Restrictions Weight Bearing Restrictions: No    Pain No complaints of pain this PT session.    Perception Comments: WFL for functional mobility  Cognition Overall Cognitive Status: Within Functional Limits for tasks assessed Arousal/Alertness: Awake/alert Orientation Level: Oriented X4 Attention: Focused;Sustained Focused Attention: Appears intact Sustained Attention: Appears intact Memory: Impaired Memory Impairment: Decreased recall of new information Awareness: Appears intact Problem Solving: Appears intact Executive Function: Reasoning;Sequencing;Decision Making Reasoning: Appears intact Sequencing: Appears intact Decision Making: Appears intact Safety/Judgment: Appears intact Sensation Sensation Light Touch: Impaired by gross assessment Light Touch Impaired Details: Impaired RLE;Impaired RUE Stereognosis: Appears Intact Hot/Cold: Appears Intact Proprioception: Appears Intact Coordination Gross Motor Movements are Fluid and Coordinated: No Fine Motor Movements are Fluid and Coordinated: No Coordination and Movement Description: Pt is using elastic laces; R LE weakness limits coordination during gait.  Motor  Motor Motor: Hemiplegia Motor - Skilled Clinical Observations: R LE weakness > R UE weakness Motor - Discharge Observations: improved strength in R LE and improved function due to use of AFO  Mobility Bed Mobility Bed Mobility: Rolling Right;Rolling Left;Sit to Supine;Supine to Sit Rolling Right: 6: Modified independent (Device/Increase time) Rolling Left: 6: Modified independent (Device/Increase time) Supine to Sit: 6: Modified independent (Device/Increase time) Sit to Supine: 6: Modified independent (Device/Increase time) Transfers Transfers: Yes Stand Pivot Transfers: 6: Modified independent (Device/Increase time) (Pt also does superman transfer from w/c to bed, at times. ) Locomotion   Ambulation Ambulation: Yes Ambulation/Gait Assistance: 6: Modified independent (Device/Increase time) Ambulation Distance (Feet): 50 Feet Assistive device: Rolling walker Gait Gait: Yes Gait Pattern: Impaired Gait Pattern: Step-to pattern;Step-through pattern;Decreased  step length - right;Decreased hip/knee flexion - right;Decreased dorsiflexion - right;Right genu recurvatum Gait velocity: decreased Stairs / Additional Locomotion Stairs: No Ramp: Not tested (comment) Curb: Not tested (comment) Product manager Mobility: Yes Wheelchair Assistance: 6: Modified independent (Device/Increase time) Environmental health practitioner: Both upper extremities Wheelchair Parts Management: Independent Distance: 200  Trunk/Postural Assessment  Cervical Assessment Cervical Assessment: Within Functional Limits Thoracic Assessment Thoracic Assessment: Within Functional Limits Lumbar Assessment Lumbar Assessment: Within Functional Limits Postural Control Postural Control: Within Functional Limits  Balance Balance Balance Assessed: Yes Static Sitting Balance Static Sitting - Balance Support: Feet supported;No upper extremity supported Static Sitting - Level of Assistance: 7: Independent Dynamic Sitting Balance Dynamic Sitting - Balance Support: No upper extremity supported;Feet supported Dynamic Sitting - Level of Assistance: 7: Independent Static Standing Balance Static Standing - Balance Support: Bilateral upper extremity supported Static Standing - Level of Assistance: 6: Modified independent (Device/Increase time) Dynamic Standing Balance Dynamic Standing - Balance Support: Bilateral upper extremity supported Dynamic Standing - Level of Assistance: 6: Modified independent (Device/Increase time) Extremity Assessment      RLE Assessment RLE Assessment: Exceptions to [facility] RLE Strength RLE Overall Strength: Deficits RLE Overall Strength Comments: R LE grossly 2+/5 - AFO obtained  to assist with foot clearance during gait LLE Assessment LLE Assessment: Within Functional Limits  See FIM for current functional status  Almira Phetteplace M 02/22/2014, 1:02 PM

## 2014-02-22 NOTE — Progress Notes (Signed)
Recreational Therapy Discharge Summary Patient Details  Name: Mary [name] MRN: [id number] Date of Birth: 06-19-1976 Today's Date: 02/22/2014  Long term goals set: 1  Long term goals met: 1  Comments on progress toward goals: Pt is scheduled for discharge home 7/9 at overall Mod I level.  Recommend supervision for community pursuits.  Pt is extremely motivated to regain independence but needs cuing at times for energy conservation. Reasons for discharge: discharge from hospitalPatient/family agrees with progress made and goals achieved: Yes  [name] 02/22/2014, 5:50 PM

## 2014-02-22 NOTE — Progress Notes (Signed)
Occupational Therapy Discharge Summary  Patient Details  Name: Mary [name] MRN: [id number] Date of Birth: 10/29/1975  Today's Date: 02/22/2014  Patient has met 36 of 13 long term goals due to improved activity tolerance, improved balance, ability to compensate for deficits and functional use of  RIGHT upper and RIGHT lower extremity.  Patient to discharge at overall Modified Independent level.  Patient's care partner is independent to provide the necessary physical and cognitive assistance at discharge.    Reasons goals not met: n/a  Recommendation:  No further OT services required at this time.  Equipment: tub bench, bedside commode  Reasons for discharge: treatment goals met  Patient/family agrees with progress made and goals achieved: Yes  OT Discharge ADL  mod I  Vision/Perception    slight blurriness, perception WFL Cognition Orientation Level: Oriented X4 Memory Impairment: Decreased recall of new information Sensation Sensation Light Touch: Impaired by gross assessment Light Touch Impaired Details: Impaired RUE;Impaired RLE Stereognosis: Appears Intact Hot/Cold: Appears Intact Proprioception: Appears Intact Coordination Fine Motor Movements are Fluid and Coordinated: No Coordination and Movement Description: slight dysmetria, continues to have difficulty with tying shoes. Using elastic laces instead. Motor  Motor Motor - Discharge Observations: Improved strength in RUE to use actively in all bimanual tasks Mobility    mod I with RW Trunk/Postural Assessment  Cervical Assessment Cervical Assessment: Within Functional Limits Thoracic Assessment Thoracic Assessment: Within Functional Limits Lumbar Assessment Lumbar Assessment: Within Functional Limits Postural Control Postural Control: Within Functional Limits  Balance Static Sitting Balance Static Sitting - Level of Assistance: 7: Independent Dynamic Sitting Balance Dynamic Sitting - Level of  Assistance: 7: Independent Static Standing Balance Static Standing - Level of Assistance: 6: Modified independent (Device/Increase time) Dynamic Standing Balance Dynamic Standing - Level of Assistance: 6: Modified independent (Device/Increase time) Extremity/Trunk Assessment RUE Assessment RUE Assessment: Exceptions to [facility] (Pt has 160 shoulder flexion with 4-/5 strength) LUE Assessment LUE Assessment: Within Functional Limits  See FIM for current functional status  SAGUIER,JULIA 02/22/2014, 11:31 AM

## 2014-02-22 NOTE — Patient Care Conference (Signed)
Inpatient RehabilitationTeam Conference and Plan of Care Update Date: 02/21/2014   Time: 2:05 PM    Patient Name: Mary [name]      Medical Record Number: [id number]  Date of Birth: October 21, 1975 Sex: Female         Room/Bed: 4W09C/4W09C-01 Payor Info: Payor: MEDICAID POTENTIAL / Plan: MEDICAID POTENTIAL / Product Type: *No Product type* /    Admitting Diagnosis: MS Exac  Admit Date/Time:  02/14/2014  5:40 PM Admission Comments: No comment available   Primary Diagnosis:  <principal problem not specified> Principal Problem: <principal problem not specified>  Patient Active Problem List   Diagnosis Date Noted  . Exacerbation of multiple sclerosis 02/14/2014  . Thrush, oral 02/12/2014  . GERD (gastroesophageal reflux disease) 02/11/2014  . Multiple sclerosis exacerbation 02/11/2014  . Relapsing remitting multiple sclerosis 09/12/2013  . Abdominal pain 07/26/2013  . Nausea and vomiting in adult 07/26/2013  . Low serum cortisol level 04/25/2013  . ? Adrenal insufficiency- Work up negative 04/23/2013  . Chronic pancreatitis with chronic pseudocysts 04/23/2013  . Abnormal MRI 04/23/2013  . Persistent vomiting 04/22/2013  . Diarrhea 04/21/2013  . Anemia 04/20/2013  . Protein-calorie malnutrition, severe 04/16/2013  . Obesity, morbid 03/15/2013  . Pancreatic cyst 03/15/2013  . history of recurrent ideopathic pancreatitis 03/15/2013    Expected Discharge Date: Expected Discharge Date: 02/23/14  Team Members Present: Physician leading conference: Dr. Faith RogueZachary Swartz Social Worker Present: Amada JupiterLucy Jermiya Reichl, LCSW Nurse Present: Carlean PurlMaryann Barbour, RN PT Present: Hosie SpangleJess Godfrey, Conchita ParisPT;Mandy Hyslop, PT OT Present: Donzetta KohutFrank Barthold, OT;Patricia Mat Carnelay, OT SLP Present: Feliberto Gottronourtney Payne, SLP Other (Discipline and Name): Ottie GlazierBarbara Boyette, RN [facility] PPS Coordinator present : Tora DuckMarie Noel, RN, CRRN;Becky Henrene DodgeWindsor, PT     Current Status/Progress Goal Weekly Team Focus  Medical   MS exacerbation with Right greater  than left weakness  improve gait, balance  anxiety, stabilizing legs.    Bowel/Bladder   Continent of bowel and bladder, Dulcolax Supp 10mg  prn for consitpation Mirilax 17g  Remain continent of bowel and bladder  Document bowel movements, continue to stay continent of bowel and bladder.   Swallow/Nutrition/ Hydration   Uprgraded to regular solids with continued thin liquids  Mod I with least restrictive diet   complete education prior to d/c    ADL's   mod I with self care, supervision for home management activities  mod I overall  pt education, IADL retraining   Mobility   mod (I) w/c level, supervision ambulation  mod (I), supervision for car t/f  high level balance, AFO fitting   Communication   Supervision to mod I for use of compensatory strategies for fluency  Mod I  complete education prior to d/c    Safety/Cognition/ Behavioral Observations  supervision-mod I for use of compensatory strategies for recall of complex information   Mod I   completed education prior to d/c.    Pain   Oxycodone 5-10mg  prn, Tylenol 325-650 prn mild pain, Flexeril 5mg  prn muscle spasms, Tramadol 50mg . Pain level at 7.  Keep pt pain level <4  Continual monitor pain level q shift, keep pt pain level <4.   Skin   Skin intact no open areas.  Keep skin intact, free of infection and breakdown by discharge.  Monitor skin q shift for infection and breakdown.    Rehab Goals Patient on target to meet rehab goals: Yes *See Care Plan and progress notes for long and short-term goals.  Barriers to Discharge: gait stability.     Possible Resolutions to  Barriers:  NMR, AFO, education    Discharge Planning/Teaching Needs:  home with step-mother and other family able to provide any needed assistance      Team Discussion:  Making good gains.  Anxiety still impacting overall progress - neuropsych has consulted.  Stuttered speech is decreased.  Reaching mod i goals.    Revisions to Treatment Plan:  AFO on RLE  needed/ ordered   Continued Need for Acute Rehabilitation Level of Care: The patient requires daily medical management by a physician with specialized training in physical medicine and rehabilitation for the following conditions: Daily direction of a multidisciplinary physical rehabilitation program to ensure safe treatment while eliciting the highest outcome that is of practical value to the patient.: Yes Daily medical management of patient stability for increased activity during participation in an intensive rehabilitation regime.: Yes Daily analysis of laboratory values and/or radiology reports with any subsequent need for medication adjustment of medical intervention for : Neurological problems;Other  Pantelis Elgersma 02/22/2014, 10:16 AM

## 2014-02-22 NOTE — Progress Notes (Signed)
38 y.o. female with h/o depression, chronic pancreatitis, MS diagnosed 08/2013 and on copaxone;who was admitted on 02/11/14 with numbness and weakness RLE progressing to RUE weakness and right facial numbness with stuttering speech. MRI brain stable without new lesions and MRI cervical spine without evidence of MS involvement. She was started on high dose solumedrol for MS exacerbation per neurology input and reported to have improvement in symptoms  Subjective/Complaints: No problems. Happy with progress. Excited to be going tomorrow.   ROS:  Right side weak, numb  Objective: Vital Signs: Blood pressure 91/59, pulse 71, temperature 97.6 F (36.4 C), temperature source Oral, resp. rate 17, height 5\' 4"  (1.626 m), weight 118.348 kg (260 lb 14.6 oz), last menstrual period 01/27/2014, SpO2 94.00%. No results found. No results found for this or any previous visit (from the past 72 hour(s)).    Physical Exam  Nursing note and vitals reviewed.  Constitutional: She is oriented to person, place, and time. She appears well-developed and well-nourished.  Morbidly obese  HENT:  Head: Normocephalic and atraumatic.  Eyes: Pupils are equal, round, and reactive to light.  Neck: Neck supple.  Cardiovascular: Normal rate and regular rhythm.  Respiratory: Effort normal and breath sounds normal. No respiratory distress. She has no wheezes.  GI: Soft. Bowel sounds are normal. She exhibits no distension. There is no tenderness.  Musculoskeletal: She exhibits no edema and no tenderness.  Neurological: She is alert and oriented to person, place, and time.  Speech clear today. Able to follow commands without difficulty. Motor Strength --RUE 3-/5,RLE 2- HF.  KE 1+and ankles trace ADF/PF. 5/5 on Left side. Sensory mildly diminished RLE   Assessment/Plan: 1. Functional deficits secondary to MS exacerbation which require 3+ hours per day of interdisciplinary therapy in a comprehensive inpatient rehab  setting. Physiatrist is providing close team supervision and 24 hour management of active medical problems listed below. Physiatrist and rehab team continue to assess barriers to discharge/monitor patient progress toward functional and medical goals.  Finalize planning for dc tomorrow. Measure for AFO today.   FIM: FIM - Bathing Bathing Steps Patient Completed: Chest;Right Arm;Left Arm;Abdomen;Front perineal area;Buttocks;Right upper leg;Left upper leg;Right lower leg (including foot);Left lower leg (including foot) Bathing: 6: Assistive device (Comment)  FIM - Upper Body Dressing/Undressing Upper body dressing/undressing steps patient completed: Thread/unthread right bra strap;Thread/unthread left bra strap;Thread/unthread right sleeve of pullover shirt/dresss;Thread/unthread left sleeve of pullover shirt/dress;Put head through opening of pull over shirt/dress;Pull shirt over trunk Upper body dressing/undressing: 7: Complete Independence: No helper FIM - Lower Body Dressing/Undressing Lower body dressing/undressing steps patient completed: Thread/unthread right pants leg;Thread/unthread left pants leg;Pull pants up/down;Thread/unthread right underwear leg;Thread/unthread left underwear leg;Pull underwear up/down;Don/Doff right sock;Don/Doff left sock;Fasten/unfasten pants Lower body dressing/undressing: 6: Assistive device (Comment)  FIM - Toileting Toileting steps completed by patient: Adjust clothing prior to toileting;Performs perineal hygiene;Adjust clothing after toileting Toileting: 6: More than reasonable amount of time  FIM - Diplomatic Services operational officerToilet Transfers Toilet Transfers Assistive Devices: Art gallery managerWalker Toilet Transfers: 6-To toilet/ BSC;6-From toilet/BSC  FIM - BankerBed/Chair Transfer Bed/Chair Transfer Assistive Devices: Walker;Orthosis (Bioness L300) Bed/Chair Transfer: 5: Chair or W/C > Bed: Supervision (verbal cues/safety issues)  FIM - Locomotion: Wheelchair Distance: 150 Locomotion:  Wheelchair: 6: Travels 150 ft or more, turns around, maneuvers to table, bed or toilet, negotiates 3% grade: maneuvers on rugs and over door sills independently FIM - Locomotion: Ambulation Locomotion: Ambulation Assistive Devices: Environmental consultantWalker - Rolling;Other (comment) (Bioness L300 - ankle DF assist, only) Ambulation/Gait Assistance: 5: Supervision Locomotion: Ambulation: 5: Travels 150  ft or more with supervision/safety issues  Comprehension Comprehension Mode: Auditory Comprehension: 7-Follows complex conversation/direction: With no assist  Expression Expression Mode: Verbal Expression: 6-Expresses complex ideas: With extra time/assistive device  Social Interaction Social Interaction: 6-Interacts appropriately with others with medication or extra time (anti-anxiety, antidepressant).  Problem Solving Problem Solving: 5-Solves complex 90% of the time/cues < 10% of the time  Memory Memory: 6-More than reasonable amt of time  Medical Problem List and Plan:  1. Functional deficits secondary to MS exacerbation.  2. DVT Prophylaxis/Anticoagulation: Pharmaceutical: Lovenox  3. Pain Management:  schedulde ultram effective. Oxycodone prn for severe pain.   -discussed gabapentin use also (doesn't want) 4. Anxiety/depression/Mood: team to provide ego support. LCSW to follow for evaluation and support.  5. Neuropsych: This patient is capable of making decisions on her own behalf.  6. Dysphagia/Throat pain: Will continue protonix. Continue modified diet.  7.  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

## 2014-02-23 DIAGNOSIS — F4323 Adjustment disorder with mixed anxiety and depressed mood: Secondary | ICD-10-CM | POA: Diagnosis present

## 2014-02-23 MED ORDER — NAPHAZOLINE HCL 0.1 % OP SOLN: 1.0000 [drp] | Freq: Three times a day (TID) | OPHTHALMIC | Status: DC

## 2014-02-23 MED ORDER — PANTOPRAZOLE SODIUM 40 MG PO PACK: 40.0000 mg | PACK | Freq: Every day | ORAL | Status: DC

## 2014-02-23 MED ORDER — TRAMADOL HCL 50 MG PO TABS: 50.0000 mg | ORAL_TABLET | Freq: Three times a day (TID) | ORAL | Status: DC

## 2014-02-23 MED ORDER — HYDROXYZINE HCL 50 MG PO TABS: 50.0000 mg | ORAL_TABLET | Freq: Four times a day (QID) | ORAL | Status: DC | PRN

## 2014-02-23 MED ORDER — ACETAMINOPHEN 325 MG PO TABS: 325.0000 mg | ORAL_TABLET | ORAL | Status: DC | PRN

## 2014-02-23 MED ORDER — TRIAMCINOLONE 0.1 % CREAM:EUCERIN CREAM 1:1
1.0000 | TOPICAL_CREAM | Freq: Three times a day (TID) | CUTANEOUS | Status: DC
Start: 2014-02-23 — End: 2015-07-21

## 2014-02-23 MED ORDER — CYCLOBENZAPRINE HCL 5 MG PO TABS: 5.0000 mg | ORAL_TABLET | Freq: Three times a day (TID) | ORAL | Status: DC | PRN

## 2014-02-23 MED ORDER — OXYCODONE HCL 10 MG PO TABS
10.0000 mg | ORAL_TABLET | Freq: Four times a day (QID) | ORAL | Status: DC | PRN
Start: 2014-02-23 — End: 2014-03-12

## 2014-02-23 MED ORDER — SENNOSIDES-DOCUSATE SODIUM 8.6-50 MG PO TABS: 2.0000 | ORAL_TABLET | Freq: Every day | ORAL | Status: DC

## 2014-02-23 NOTE — Progress Notes (Signed)
Patient is being discharge with family member at bedside, discharge information given by Marissa NestlePam Love, PA. All belongings packed and taken with patient. [place]

## 2014-02-23 NOTE — Discharge Summary (Signed)
Physician Discharge Summary  Patient ID: Mary [name] MRN: [id number] DOB/AGE: 1975-08-21 38 y.o.  Admit date: 02/14/2014 Discharge date: 02/23/2014  Discharge Diagnoses:  Principal Problem:   Exacerbation of multiple sclerosis Active Problems:   Adjustment disorder with mixed anxiety and depressed mood   Discharged Condition: stable    Labs:  Basic Metabolic Panel: BMET    Component Value Date/Time   NA 140 02/15/2014 0550   K 4.0 02/15/2014 0550   CL 102 02/15/2014 0550   CO2 25 02/15/2014 0550   GLUCOSE 118* 02/15/2014 0550   BUN 20 02/15/2014 0550   CREATININE 0.56 02/15/2014 0550   CALCIUM 8.3* 02/15/2014 0550   GFRNONAA >90 02/15/2014 0550   GFRAA >90 02/15/2014 0550     CBC: CBC Latest Ref Rng 02/15/2014 02/12/2014 02/11/2014  WBC 4.0 - 10.5 K/uL 12.7(H) 6.8 9.3  Hemoglobin 12.0 - 15.0 g/dL 10.4(L) 11.5(L) 12.0  Hematocrit 36.0 - 46.0 % 32.4(L) 35.1(L) 37.2  Platelets 150 - 400 K/uL 286 275 270     CBG: No results found for this basename: GLUCAP,  in the last 168 hours  Brief HPI:   Mary [name] is a 38 y.o. female with h/o depression, chronic pancreatitis, MS diagnosed 08/2013 and on copaxone;who was admitted on 02/11/14 with numbness and weakness RLE progressing to RUE weakness and right facial numbness with stuttering speech. MRI brain stable without new lesions and MRI cervical spine without evidence of MS involvement. She was started on high dose solumedrol for MS exacerbation per neurology input and reported to have improvement in symptoms. Patient placed on liquid diet due to dicomfort with solids but willing to be advanced to dysphagia 2--SLP questions anxiety v/s esophageal phase issues. Therapy initiated and CIR recommended by rehab team.    Hospital Course: Mary [name] was admitted to rehab 02/14/2014 for inpatient therapies to consist of PT, ST and OT at least three hours five days a week. Past admission physiatrist, therapy team and rehab RN have worked together to  provide customized collaborative inpatient rehab. Mood has improved with decrease in anxiety levels. She was started on nystatin mouthwash as well as aggressive oral care to help with oral pain. As anxiety levels improved, she was agreeable to advance to regular diet and is tolerating this without difficulty. Blood pressures have been stable. She is continent of bowel and bladder. She continues to have dysesthesias requiring oxycodone for pain management. She declined addition of Neurontin despite education on its benefits due to fear of SE. She has progressed to modified independent level at discharge. Family to assist as needed past discharge.   Rehab course: During patient's stay in rehab weekly team conferences were held to monitor patient's progress, set goals and discuss barriers to discharge. Patient has had improvement in activity tolerance, balance, postural control, as well as ability to compensate for deficits. She is has had improvement in functional use RUE and RLE as well as improved awareness. She was fitted with R-AFO to help with stabilization and gait. She is modified independent for bathing, dressing, simple home management tasks as well as all aspects of mobility. Speech is > 90% fluent and cognition is intact.    Disposition: 01-Home or Self Care  Diet: Regular  Special instructions: 1. Goal of pain management is to wean oxycodone to  one pill tid prn  2. L[facility]     Medication List    STOP taking  these medications       gabapentin 100 MG capsule  Commonly known as:  NEURONTIN     pantoprazole 40 MG tablet  Commonly known as:  PROTONIX  Replaced by:  pantoprazole sodium 40 mg/20 mL Pack      TAKE these medications       acetaminophen 325 MG tablet  Commonly known as:  TYLENOL  Take 1-2 tablets (325-650 mg total) by mouth every 4 (four) hours as needed for mild pain.     b complex vitamins  tablet  Take 1 tablet by mouth daily.     cholecalciferol 1000 UNITS tablet  Commonly known as:  VITAMIN D  Take 5,000 Units by mouth daily.     cyclobenzaprine 5 MG tablet--Rx 45 pills/1 refill  Commonly known as:  FLEXERIL  Take 1 tablet (5 mg total) by mouth 3 (three) times daily as needed for muscle spasms.     Glatiramer Acetate 40 MG/ML Sosy  Commonly known as:  COPAXONE  Inject 1 Syringe into the skin 3 (three) times a week.     hydrOXYzine 50 MG tablet--Rx # 60 pills   Commonly known as:  ATARAX/VISTARIL  Take 1 tablet (50 mg total) by mouth every 6 (six) hours as needed for anxiety.     lipase/protease/amylase 32122 UNITS Cpep capsule  Commonly known as:  CREON-12/PANCREASE  Take 2 capsules by mouth 3 (three) times daily with meals.     naphazoline 0.1 % ophthalmic solution  Commonly known as:  NAPHCON  Place 1 drop into both eyes 4 (four) times daily -  with meals and at bedtime.     Oxycodone HCl 10 MG Tabs--RX # 140 pills   Take 1 tablet (10 mg total) by mouth every 6 (six) hours as needed for severe pain.     pantoprazole sodium 40 mg/20 mL Pack  Commonly known as:  PROTONIX  Take 20 mLs (40 mg total) by mouth daily.     promethazine 25 MG tablet  Commonly known as:  PHENERGAN  Take 1 tablet (25 mg total) by mouth every 6 (six) hours as needed for nausea or vomiting.     senna-docusate 8.6-50 MG per tablet  Commonly known as:  Senokot-S  Take 2 tablets by mouth at bedtime.     traMADol 50 MG tablet  Commonly known as:  ULTRAM  Take 1 tablet (50 mg total) by mouth 4 (four) times daily -  with meals and at bedtime.     triamcinolone 0.1 % cream : eucerin Crea  Apply 1 application topically 3 (three) times daily.       Follow-up Information   Follow up with Ranelle Oyster, MD On 04/26/2014. (Be there at 10 am for 10:20 appointment)    Specialty:  Physical Medicine and Rehabilitation   Contact information:   [address], [address] [place]  [address] [phone]       Follow up with JAFFE, ADAM ROBERT, DO. Call today. (for follow up appointment)    Specialty:  Neurology   Contact information:   [address] [place] [address] [phone]       Follow up with Jacalyn Lefevre, NP On 03/23/2014. (Be there at 10 am for 10:30--set up for pain management. )    Specialty:  Physical Medicine and Rehabilitation   Contact information:   [address] [place] [address] [phone]       Signed: Jacquelynn Cree 02/23/2014,  5:05 PM

## 2014-02-23 NOTE — Progress Notes (Signed)
Social Work  Discharge Note  The overall goal for the admission was met for:   Discharge location: Yes - home with step-mom and brother to assist as needed  Length of Stay: Yes - 9 days  Discharge activity level: Yes - modified independent  Home/community participation: Yes  Services provided included: MD, RD, PT, OT, SLP, RN, TR, Pharmacy, Neuropsych and SW  Financial Services: Other: Medicaid app pending  Follow-up services arranged: DME: 20x16 lightweight w/c, cushion, rolling walker, 3n1 commode and tub bench via [place], Other: also referred to [facility] and [facility] for primary medical follow up and orange card eligilibility and Patient/Family has no preference for HH/DME agencies  Comments (or additional information):  Have explained to pt that follow up PT has been recommended, however, Medicaid does not cover these services for her diagnosis  Patient/Family verbalized understanding of follow-up arrangements: Yes  Individual responsible for coordination of the follow-up plan: pt  Confirmed correct DME delivered: Rashea Hoskie 02/23/2014    Jessikah Dicker

## 2014-02-23 NOTE — Progress Notes (Signed)
38 y.o. female with h/o depression, chronic pancreatitis, MS diagnosed 08/2013 and on copaxone;who was admitted on 02/11/14 with numbness and weakness RLE progressing to RUE weakness and right facial numbness with stuttering speech. MRI brain stable without new lesions and MRI cervical spine without evidence of MS involvement. She was started on high dose solumedrol for MS exacerbation per neurology input and reported to have improvement in symptoms  Subjective/Complaints: No new issues. Ready to go home. Appreciative of team here.   ROS:  No new issues  Objective: Vital Signs: Blood pressure 103/65, pulse 67, temperature 97.8 F (36.6 C), temperature source Oral, resp. rate 17, height 5' 4"  (1.626 m), weight 118.348 kg (260 lb 14.6 oz), last menstrual period 01/27/2014, SpO2 92.00%. No results found. No results found for this or any previous visit (from the past 72 hour(s)).    Physical Exam  Nursing note and vitals reviewed.  Constitutional: She is oriented to person, place, and time. She appears well-developed and well-nourished.  Morbidly obese  HENT:  Head: Normocephalic and atraumatic.  Eyes: Pupils are equal, round, and reactive to light.  Neck: Neck supple.  Cardiovascular: Normal rate and regular rhythm.  Respiratory: Effort normal and breath sounds normal. No respiratory distress. She has no wheezes.  GI: Soft. Bowel sounds are normal. She exhibits no distension. There is no tenderness.  Musculoskeletal: She exhibits no edema and no tenderness.  Neurological: She is alert and oriented to person, place, and time.  Speech clear . Able to follow commands without difficulty. Motor Strength --RUE 3 to 3+/5,RLE 2-HF.  KE 1+ to 2, and ankles trace ADF/PF. 5/5 on Left side. Sensory mildly diminished RLE   Assessment/Plan: 1. Functional deficits secondary to MS exacerbation which require 3+ hours per day of interdisciplinary therapy in a comprehensive inpatient rehab  setting. Physiatrist is providing close team supervision and 24 hour management of active medical problems listed below. Physiatrist and rehab team continue to assess barriers to discharge/monitor patient progress toward functional and medical goals.  Dc home today. Goals met. i would like her to don/use AFO today prior to leaving.   FIM: FIM - Bathing Bathing Steps Patient Completed: Chest;Right Arm;Left Arm;Abdomen;Front perineal area;Buttocks;Right upper leg;Left upper leg;Right lower leg (including foot);Left lower leg (including foot) Bathing: 6: Assistive device (Comment)  FIM - Upper Body Dressing/Undressing Upper body dressing/undressing steps patient completed: Thread/unthread right bra strap;Thread/unthread left bra strap;Thread/unthread right sleeve of pullover shirt/dresss;Thread/unthread left sleeve of pullover shirt/dress;Put head through opening of pull over shirt/dress;Pull shirt over trunk Upper body dressing/undressing: 7: Complete Independence: No helper FIM - Lower Body Dressing/Undressing Lower body dressing/undressing steps patient completed: Thread/unthread right pants leg;Thread/unthread left pants leg;Pull pants up/down;Thread/unthread right underwear leg;Thread/unthread left underwear leg;Pull underwear up/down;Don/Doff right sock;Don/Doff left sock;Fasten/unfasten pants Lower body dressing/undressing: 6: Assistive device (Comment)  FIM - Toileting Toileting steps completed by patient: Adjust clothing prior to toileting;Performs perineal hygiene;Adjust clothing after toileting Toileting: 6: More than reasonable amount of time  FIM - Radio producer Devices: Walker;Elevated toilet seat Toilet Transfers: 6-To toilet/ BSC;6-From toilet/BSC  FIM - Control and instrumentation engineer Devices: Bed rails;Arm rests Bed/Chair Transfer: 6: Assistive device: no helper  FIM - Locomotion: Wheelchair Distance: 200 Locomotion:  Wheelchair: 6: Travels 150 ft or more, turns around, maneuvers to table, bed or toilet, negotiates 3% grade: maneuvers on rugs and over door sills independently FIM - Locomotion: Ambulation Locomotion: Ambulation Assistive Devices: Administrator Ambulation/Gait Assistance: 6: Modified independent (Device/Increase time) Locomotion: Ambulation:  5: Household Independent - travels 25 - 149 ft independent or modified independent  Comprehension Comprehension Mode: Auditory Comprehension: 6-Follows complex conversation/direction: With extra time/assistive device  Expression Expression Mode: Verbal Expression: 6-Expresses complex ideas: With extra time/assistive device  Social Interaction Social Interaction: 6-Interacts appropriately with others with medication or extra time (anti-anxiety, antidepressant).  Problem Solving Problem Solving: 6-Solves complex problems: With extra time  Memory Memory: 6-More than reasonable amt of time  Medical Problem List and Plan:  1. Functional deficits secondary to MS exacerbation.  2. DVT Prophylaxis/Anticoagulation: Pharmaceutical: Lovenox  3. Pain Management:  schedulde ultram effective. Oxycodone prn for severe pain---continue as outpt  -discussed gabapentin use also (doesn't want) 4. Anxiety/depression/Mood: team to provide ego support. LCSW to follow for evaluation and support.  5. Neuropsych: This patient is capable of making decisions on her own behalf.  6. Dysphagia/Throat pain: Will continue protonix. Continue modified diet.  7.  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

## 2014-02-23 NOTE — Progress Notes (Signed)
Speech Language Pathology Discharge Summary  Patient Details  Name: Mary [name] MRN: [id number] Date of Birth: 1975/10/30  Today's Date: 02/23/2014   Patient has met 3 of 3 long term goals.  Patient to discharge at overall Modified Independent level.  Reasons goals not met:  n/a   Clinical Impression/Discharge Summary:  Pt made functional gains while inpatient and met 3 out of 3 long term goals due to improved fluency, diet tolerance, and use of compensatory strategies for memory.  Pt is currently modified independent for cognitive-linguistic tasks, her speech fluency has mostly returned to baseline, and she is tolerating a regular consistency diet with thin liquids and no complaints of globus sensation.  All pt's short term and long term goals have been met and as a result pt will not need follow up speech therapy upon discharge.  Pt education is complete at this time.      Care Partner:  Caregiver Able to Provide Assistance: Yes  Type of Caregiver Assistance: Physical;Cognitive  Recommendation:  None     Equipment: none recommended by SLP   Reasons for discharge: Discharged from hospital   Patient/Family Agrees with Progress Made and Goals Achieved: Yes   See FIM for current functional status  Windell Moulding, M.A. CCC-SLP  Jorey Dollard, Selinda Orion 02/23/2014, 5:46 PM

## 2014-02-23 NOTE — Discharge Instructions (Signed)
Inpatient Rehab Discharge Instructions  Shawndrea Irigoyen Laube Discharge date and time: 02/23/14   Activities/Precautions/ Functional Status: Activity: activity as tolerated Diet: regular diet Wound Care: none needed  Functional status:  ___ No restrictions     ___ Walk up steps independently ___ 24/7 supervision/assistance   ___ Walk up steps with assistance _x__ Intermittent supervision/assistance  ___ Bathe/dress independently ___ Walk with walker     ___ Bathe/dress with assistance ___ Walk Independently    ___ Shower independently ___ Walk with assistance    ___ Shower with assistance _X__ No alcohol     ___ Return to work/school ________     COMMUNITY REFERRALS UPON DISCHARGE:    Medical Equipment/Items Ordered: wheelchair, cushion, walker, 3n1 commode and tub bench                                                     Agency/Supplier: [facility] @ [phone]  Other: Have referred you to the [facility] and [facility] for primary medical care and orange card eligibility             Will contact you at home once appointment times are known   GENERAL COMMUNITY RESOURCES FOR PATIENT/FAMILY:  Support Groups: available via [place] [employer] [phone])    Mental Health:  Contact as needed Orie Fisherman (neuropsychologist):  [phone]      Special Instructions: 1. Need to wean down on oxycodone. Goal is to decrease to one pill three times a day as needed for pain.    My questions have been answered and I understand these instructions. I will adhere to these goals and the provided educational materials after my discharge from the hospital.  Patient/Caregiver Signature _______________________________ Date __________  Clinician Signature _______________________________________ Date __________  Please bring this form and your medication list with you to all your follow-up doctor's appointments.

## 2014-03-10 ENCOUNTER — Inpatient Hospital Stay (HOSPITAL_COMMUNITY)
Admission: EM | Admit: 2014-03-10 | Discharge: 2014-03-12 | DRG: 439 | Disposition: A | Discharge status: Home / Self Care | Payer: Medicaid Other | Attending: Internal Medicine | Admitting: Internal Medicine

## 2014-03-10 DIAGNOSIS — J45909 Unspecified asthma, uncomplicated: Secondary | ICD-10-CM | POA: Diagnosis present

## 2014-03-10 DIAGNOSIS — Z880 Allergy status to penicillin: Secondary | ICD-10-CM

## 2014-03-10 DIAGNOSIS — K859 Acute pancreatitis without necrosis or infection, unspecified: Principal | ICD-10-CM | POA: Diagnosis present

## 2014-03-10 DIAGNOSIS — R112 Nausea with vomiting, unspecified: Secondary | ICD-10-CM | POA: Diagnosis present

## 2014-03-10 DIAGNOSIS — K219 Gastro-esophageal reflux disease without esophagitis: Secondary | ICD-10-CM | POA: Diagnosis present

## 2014-03-10 DIAGNOSIS — Z833 Family history of diabetes mellitus: Secondary | ICD-10-CM

## 2014-03-10 DIAGNOSIS — E2749 Other adrenocortical insufficiency: Secondary | ICD-10-CM | POA: Diagnosis present

## 2014-03-10 DIAGNOSIS — F3289 Other specified depressive episodes: Secondary | ICD-10-CM | POA: Diagnosis present

## 2014-03-10 DIAGNOSIS — G35 Multiple sclerosis: Secondary | ICD-10-CM | POA: Diagnosis present

## 2014-03-10 DIAGNOSIS — Z91040 Latex allergy status: Secondary | ICD-10-CM

## 2014-03-10 DIAGNOSIS — F329 Major depressive disorder, single episode, unspecified: Secondary | ICD-10-CM | POA: Diagnosis present

## 2014-03-10 DIAGNOSIS — Z79899 Other long term (current) drug therapy: Secondary | ICD-10-CM

## 2014-03-10 DIAGNOSIS — F411 Generalized anxiety disorder: Secondary | ICD-10-CM | POA: Diagnosis present

## 2014-03-10 DIAGNOSIS — R1013 Epigastric pain: Secondary | ICD-10-CM

## 2014-03-10 DIAGNOSIS — Z885 Allergy status to narcotic agent status: Secondary | ICD-10-CM

## 2014-03-10 DIAGNOSIS — Z87891 Personal history of nicotine dependence: Secondary | ICD-10-CM

## 2014-03-10 DIAGNOSIS — K8500 Idiopathic acute pancreatitis without necrosis or infection: Secondary | ICD-10-CM | POA: Diagnosis present

## 2014-03-10 LAB — CBC WITH DIFFERENTIAL/PLATELET
Basophils Absolute: 0 10*3/uL (ref 0.0–0.1)
Basophils Relative: 0 % (ref 0–1)
EOS ABS: 0.3 10*3/uL (ref 0.0–0.7)
EOS PCT: 2 % (ref 0–5)
HCT: 38.2 % (ref 36.0–46.0)
HEMOGLOBIN: 12.7 g/dL (ref 12.0–15.0)
LYMPHS ABS: 1.9 10*3/uL (ref 0.7–4.0)
Lymphocytes Relative: 12 % (ref 12–46)
MCH: 28.2 pg (ref 26.0–34.0)
MCHC: 33.2 g/dL (ref 30.0–36.0)
MCV: 84.9 fL (ref 78.0–100.0)
MONO ABS: 1.2 10*3/uL — AB (ref 0.1–1.0)
MONOS PCT: 7 % (ref 3–12)
Neutro Abs: 12.4 10*3/uL — ABNORMAL HIGH (ref 1.7–7.7)
Neutrophils Relative %: 79 % — ABNORMAL HIGH (ref 43–77)
Platelets: 319 10*3/uL (ref 150–400)
RBC: 4.5 MIL/uL (ref 3.87–5.11)
RDW: 13.9 % (ref 11.5–15.5)
WBC: 15.8 10*3/uL — ABNORMAL HIGH (ref 4.0–10.5)

## 2014-03-10 MED ORDER — SODIUM CHLORIDE 0.9 % IV SOLN
Freq: Once | INTRAVENOUS | Status: AC
  Administered 2014-03-11: 10 mL/h via INTRAVENOUS

## 2014-03-10 MED ORDER — ONDANSETRON HCL 4 MG/2ML IJ SOLN
4.0000 mg | Freq: Once | INTRAMUSCULAR | Status: AC
  Administered 2014-03-11: 4 mg via INTRAVENOUS
  Filled 2014-03-10: qty 2

## 2014-03-10 MED ORDER — HYDROMORPHONE HCL PF 1 MG/ML IJ SOLN
1.0000 mg | Freq: Once | INTRAMUSCULAR | Status: AC
  Administered 2014-03-11: 1 mg via INTRAVENOUS
  Filled 2014-03-10: qty 1

## 2014-03-10 NOTE — ED Notes (Signed)
2 RN's attempted to insert PIV and draw blood, no success; pt sts she usually requires PICC line due to chronic pancreatitis and vein scarring. IV team paged.EDP notified.

## 2014-03-10 NOTE — ED Notes (Signed)
Pt arrived to the Ed with a complaint of abdominal pain.  Pain is located mid medially with a radiation to the right flank.  Pt has a hx of pancreatitis.  Pt also has a hx of MS. Pt states pain began this am and has progressively gotten worse.

## 2014-03-10 NOTE — ED Notes (Signed)
Pt ambulatory to exam room with steady gait.  

## 2014-03-11 ENCOUNTER — Encounter (HOSPITAL_COMMUNITY): Payer: Self-pay | Admitting: *Deleted

## 2014-03-11 ENCOUNTER — Inpatient Hospital Stay (HOSPITAL_COMMUNITY): Payer: Medicaid Other

## 2014-03-11 DIAGNOSIS — Z880 Allergy status to penicillin: Secondary | ICD-10-CM | POA: Diagnosis not present

## 2014-03-11 DIAGNOSIS — K219 Gastro-esophageal reflux disease without esophagitis: Secondary | ICD-10-CM | POA: Diagnosis present

## 2014-03-11 DIAGNOSIS — K859 Acute pancreatitis without necrosis or infection, unspecified: Secondary | ICD-10-CM | POA: Diagnosis not present

## 2014-03-11 DIAGNOSIS — R1013 Epigastric pain: Secondary | ICD-10-CM | POA: Diagnosis not present

## 2014-03-11 DIAGNOSIS — J45909 Unspecified asthma, uncomplicated: Secondary | ICD-10-CM | POA: Diagnosis present

## 2014-03-11 DIAGNOSIS — G35 Multiple sclerosis: Secondary | ICD-10-CM | POA: Diagnosis present

## 2014-03-11 DIAGNOSIS — E2749 Other adrenocortical insufficiency: Secondary | ICD-10-CM | POA: Diagnosis present

## 2014-03-11 DIAGNOSIS — K8500 Idiopathic acute pancreatitis without necrosis or infection: Secondary | ICD-10-CM | POA: Diagnosis present

## 2014-03-11 DIAGNOSIS — Z87891 Personal history of nicotine dependence: Secondary | ICD-10-CM | POA: Diagnosis not present

## 2014-03-11 DIAGNOSIS — Z833 Family history of diabetes mellitus: Secondary | ICD-10-CM | POA: Diagnosis not present

## 2014-03-11 DIAGNOSIS — Z885 Allergy status to narcotic agent status: Secondary | ICD-10-CM | POA: Diagnosis not present

## 2014-03-11 DIAGNOSIS — F411 Generalized anxiety disorder: Secondary | ICD-10-CM | POA: Diagnosis present

## 2014-03-11 DIAGNOSIS — Z91040 Latex allergy status: Secondary | ICD-10-CM | POA: Diagnosis not present

## 2014-03-11 DIAGNOSIS — Z79899 Other long term (current) drug therapy: Secondary | ICD-10-CM | POA: Diagnosis not present

## 2014-03-11 DIAGNOSIS — F3289 Other specified depressive episodes: Secondary | ICD-10-CM | POA: Diagnosis present

## 2014-03-11 DIAGNOSIS — R112 Nausea with vomiting, unspecified: Secondary | ICD-10-CM | POA: Diagnosis present

## 2014-03-11 DIAGNOSIS — F329 Major depressive disorder, single episode, unspecified: Secondary | ICD-10-CM | POA: Diagnosis present

## 2014-03-11 LAB — CBC
HEMATOCRIT: 36.2 % (ref 36.0–46.0)
HEMOGLOBIN: 11.6 g/dL — AB (ref 12.0–15.0)
MCH: 27.4 pg (ref 26.0–34.0)
MCHC: 32 g/dL (ref 30.0–36.0)
MCV: 85.6 fL (ref 78.0–100.0)
Platelets: 304 10*3/uL (ref 150–400)
RBC: 4.23 MIL/uL (ref 3.87–5.11)
RDW: 14.2 % (ref 11.5–15.5)
WBC: 9.7 10*3/uL (ref 4.0–10.5)

## 2014-03-11 LAB — COMPREHENSIVE METABOLIC PANEL
ALT: 30 U/L (ref 0–35)
ALT: 24 U/L (ref 0–35)
ANION GAP: 14 (ref 5–15)
AST: 29 U/L (ref 0–37)
AST: 19 U/L (ref 0–37)
Albumin: 3.8 g/dL (ref 3.5–5.2)
Albumin: 3.2 g/dL — ABNORMAL LOW (ref 3.5–5.2)
Alkaline Phosphatase: 96 U/L (ref 39–117)
Alkaline Phosphatase: 88 U/L (ref 39–117)
Anion gap: 12 (ref 5–15)
BILIRUBIN TOTAL: 0.3 mg/dL (ref 0.3–1.2)
BUN: 5 mg/dL — AB (ref 6–23)
BUN: 8 mg/dL (ref 6–23)
CALCIUM: 9.4 mg/dL (ref 8.4–10.5)
CHLORIDE: 106 meq/L (ref 96–112)
CO2: 20 mEq/L (ref 19–32)
CO2: 23 mEq/L (ref 19–32)
Calcium: 9.1 mg/dL (ref 8.4–10.5)
Chloride: 105 mEq/L (ref 96–112)
Creatinine, Ser: 0.48 mg/dL — ABNORMAL LOW (ref 0.50–1.10)
Creatinine, Ser: 0.56 mg/dL (ref 0.50–1.10)
GFR calc Af Amer: >90 mL/min (ref >90)
GFR calc non Af Amer: >90 mL/min (ref >90)
GFR calc non Af Amer: >90 mL/min (ref >90)
GLUCOSE: 113 mg/dL — AB (ref 70–99)
GLUCOSE: 117 mg/dL — AB (ref 70–99)
Potassium: 4 mEq/L (ref 3.7–5.3)
Potassium: 3.9 mEq/L (ref 3.7–5.3)
SODIUM: 141 meq/L (ref 137–147)
Sodium: 139 mEq/L (ref 137–147)
TOTAL PROTEIN: 7.6 g/dL (ref 6.0–8.3)
Total Bilirubin: <0.2 mg/dL — ABNORMAL LOW (ref 0.3–1.2)
Total Protein: 8.3 g/dL (ref 6.0–8.3)

## 2014-03-11 LAB — LIPID PANEL
CHOL/HDL RATIO: 6.7 ratio
Cholesterol: 180 mg/dL (ref 0–200)
HDL: 27 mg/dL — ABNORMAL LOW (ref >39)
LDL Cholesterol: 121 mg/dL — ABNORMAL HIGH (ref 0–99)
Triglycerides: 160 mg/dL — ABNORMAL HIGH (ref <150)
VLDL: 32 mg/dL (ref 0–40)

## 2014-03-11 LAB — LIPASE, BLOOD
Lipase: 1270 U/L — ABNORMAL HIGH (ref 11–59)
Lipase: 523 U/L — ABNORMAL HIGH (ref 11–59)

## 2014-03-11 MED ORDER — PANTOPRAZOLE SODIUM 40 MG IV SOLR
40.0000 mg | INTRAVENOUS | Status: DC
  Administered 2014-03-11 – 2014-03-12 (×2): 40 mg via INTRAVENOUS
  Filled 2014-03-11 (×2): qty 40

## 2014-03-11 MED ORDER — TRAMADOL HCL 50 MG PO TABS: 50.0000 mg | ORAL_TABLET | Freq: Three times a day (TID) | ORAL | Status: DC

## 2014-03-11 MED ORDER — LEVOFLOXACIN IN D5W 750 MG/150ML IV SOLN
750.0000 mg | Freq: Every day | INTRAVENOUS | Status: DC
  Administered 2014-03-11: 750 mg via INTRAVENOUS
  Filled 2014-03-11: qty 150

## 2014-03-11 MED ORDER — CYCLOBENZAPRINE HCL 10 MG PO TABS: 5.0000 mg | ORAL_TABLET | Freq: Three times a day (TID) | ORAL | Status: DC | PRN

## 2014-03-11 MED ORDER — PROMETHAZINE HCL 25 MG/ML IJ SOLN
25.0000 mg | Freq: Four times a day (QID) | INTRAMUSCULAR | Status: DC | PRN
  Administered 2014-03-11 – 2014-03-12 (×3): 25 mg via INTRAVENOUS
  Filled 2014-03-11 (×3): qty 1

## 2014-03-11 MED ORDER — ACETAMINOPHEN 325 MG PO TABS: 650.0000 mg | ORAL_TABLET | Freq: Four times a day (QID) | ORAL | Status: DC | PRN

## 2014-03-11 MED ORDER — ENOXAPARIN SODIUM 40 MG/0.4ML ~~LOC~~ SOLN
40.0000 mg | SUBCUTANEOUS | Status: DC
  Administered 2014-03-12: 40 mg via SUBCUTANEOUS
  Filled 2014-03-11 (×2): qty 0.4

## 2014-03-11 MED ORDER — SODIUM CHLORIDE 0.9 % IV SOLN: INTRAVENOUS | Status: DC

## 2014-03-11 MED ORDER — GLATIRAMER ACETATE 40 MG/ML ~~LOC~~ SOSY: 1.0000 | PREFILLED_SYRINGE | SUBCUTANEOUS | Status: DC

## 2014-03-11 MED ORDER — HYDROMORPHONE HCL PF 2 MG/ML IJ SOLN
2.0000 mg | INTRAMUSCULAR | Status: DC | PRN
  Administered 2014-03-11 – 2014-03-12 (×12): 2 mg via INTRAVENOUS
  Filled 2014-03-11 (×12): qty 1

## 2014-03-11 MED ORDER — DIPHENHYDRAMINE HCL 50 MG/ML IJ SOLN
25.0000 mg | Freq: Once | INTRAMUSCULAR | Status: AC
  Administered 2014-03-11: 25 mg via INTRAVENOUS
  Filled 2014-03-11: qty 1

## 2014-03-11 MED ORDER — ACETAMINOPHEN 650 MG RE SUPP: 650.0000 mg | Freq: Four times a day (QID) | RECTAL | Status: DC | PRN

## 2014-03-11 MED ORDER — PANTOPRAZOLE SODIUM 40 MG PO PACK
40.0000 mg | PACK | Freq: Every day | ORAL | Status: DC
  Filled 2014-03-11: qty 20

## 2014-03-11 MED ORDER — NAPHAZOLINE HCL 0.1 % OP SOLN
1.0000 [drp] | Freq: Three times a day (TID) | OPHTHALMIC | Status: DC
  Administered 2014-03-11 – 2014-03-12 (×6): 1 [drp] via OPHTHALMIC
  Filled 2014-03-11 (×2): qty 15

## 2014-03-11 MED ORDER — HYDROMORPHONE HCL PF 1 MG/ML IJ SOLN
1.0000 mg | Freq: Once | INTRAMUSCULAR | Status: AC
  Administered 2014-03-11: 1 mg via INTRAVENOUS
  Filled 2014-03-11: qty 1

## 2014-03-11 MED ORDER — SODIUM CHLORIDE 0.9 % IV SOLN
INTRAVENOUS | Status: DC
  Administered 2014-03-11 – 2014-03-12 (×3): via INTRAVENOUS

## 2014-03-11 MED ORDER — METRONIDAZOLE IN NACL 5-0.79 MG/ML-% IV SOLN
500.0000 mg | Freq: Three times a day (TID) | INTRAVENOUS | Status: DC
  Administered 2014-03-11: 500 mg via INTRAVENOUS
  Filled 2014-03-11 (×2): qty 100

## 2014-03-11 MED ORDER — PROMETHAZINE HCL 25 MG/ML IJ SOLN
12.5000 mg | Freq: Once | INTRAMUSCULAR | Status: AC
  Administered 2014-03-11: 12.5 mg via INTRAVENOUS
  Filled 2014-03-11: qty 1

## 2014-03-11 MED ORDER — HYDROXYZINE HCL 50 MG PO TABS
50.0000 mg | ORAL_TABLET | Freq: Four times a day (QID) | ORAL | Status: DC | PRN
  Filled 2014-03-11: qty 1

## 2014-03-11 MED ORDER — HYDROMORPHONE HCL PF 1 MG/ML IJ SOLN
1.0000 mg | INTRAMUSCULAR | Status: DC | PRN
  Administered 2014-03-11 (×3): 1 mg via INTRAVENOUS
  Filled 2014-03-11 (×3): qty 1

## 2014-03-11 NOTE — ED Notes (Signed)
Admitting MD at bedside.

## 2014-03-11 NOTE — ED Provider Notes (Signed)
CSN: [id number]     Arrival date & time 03/10/14  2128 History   First MD Initiated Contact with Patient 03/10/14 2202     Chief Complaint  Patient presents with  . Abdominal Pain     (Consider location/radiation/quality/duration/timing/severity/associated sxs/prior Treatment) Patient is a 38 y.o. female presenting with abdominal pain. The history is provided by the patient and a parent. No language interpreter was used.  Abdominal Pain Pain location:  Epigastric Pain quality: sharp   Pain radiation: Pain radiates bilaterally around to back. Pain severity:  Moderate Associated symptoms: diarrhea, nausea and vomiting   Associated symptoms: no chest pain, no fever and no shortness of breath   Associated symptoms comment:  She has a history of pancreatitis and feels that current symptoms are a recurrence. No fever. Symptoms started this morning and have gotten progressively worse throughout the day. She reports loose stools without blood. She also reports nausea and vomiting, also non-bloody.   Past Medical History  Diagnosis Date  . Hernia 03-24-13    ventral hernia remains   . GERD (gastroesophageal reflux disease)   . Pancreatitis 03-24-13    2002, 2'2013, 03-14-13  . Pancreatic cyst 2002 onset  . Anxiety   . Depression     "recent breakup with partner of 15 yrs"  . Adrenal insufficiency 04/23/2013    ??  . MS (multiple sclerosis)    Past Surgical History  Procedure Laterality Date  . Abdominal surgery  ~ 2007    some sort of pancreatic cyst drainage.   . Cholecystectomy      ~ age 37-laparoscopic  . Adenoidectomy    . 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 Eus N/A 04/14/2013    Procedure: UPPER ENDOSCOPIC ULTRASOUND (EUS) LINEAR;  Surgeon: Rachael Fee, MD;  Location: [facility] END[facility];  Service: Endoscopy;  Laterality: N/A;   Family History  Problem Relation Age of Onset  . Lupus Neg Hx   . Sarcoidosis Neg Hx   .  Pancreatitis Neg Hx   . Ataxia Neg Hx   . Chorea Neg Hx   . Dementia Neg Hx   . Mental retardation Neg Hx   . Migraines Neg Hx   . Multiple sclerosis Neg Hx   . Neurofibromatosis Neg Hx   . Neuropathy Neg Hx   . Parkinsonism Neg Hx   . Seizures Neg Hx   . Stroke Neg Hx   . Colitis Maternal Aunt   . Diabetes Mother   . Diabetes Father   . Diabetes      many family members   History  Substance Use Topics  . Smoking status: Former Smoker -- 0.50 packs/day for 20 years    Types: Cigarettes    Quit date: 05/26/2013  . Smokeless tobacco: Never Used  . Alcohol Use: No   OB History   Grav Para Term Preterm Abortions TAB SAB Ect Mult Living                 Review of Systems  Constitutional: Negative for fever.  Respiratory: Negative for shortness of breath.   Cardiovascular: Negative for chest pain.  Gastrointestinal: Positive for nausea, vomiting, abdominal pain and diarrhea.  Skin: Negative for color change.  Neurological: Negative for dizziness, syncope and light-headedness.      Allergies  Amoxicillin; Penicillins; Latex; and Morphine and related  Home Medications   Prior to Admission medications   Medication Sig Start  Date End Date Taking? Authorizing Provider  b complex vitamins tablet Take 1 tablet by mouth daily.   Yes Historical Provider, MD  cholecalciferol (VITAMIN D) 1000 UNITS tablet Take 5,000 Units by mouth daily.   Yes Historical Provider, MD  cyclobenzaprine (FLEXERIL) 5 MG tablet Take 1 tablet (5 mg total) by mouth 3 (three) times daily as needed for muscle spasms. 02/23/14  Yes Evlyn KannerPamela S Love, PA-C  Glatiramer Acetate (COPAXONE) 40 MG/ML SOSY Inject 1 Syringe into the skin 3 (three) times a week. 09/12/13  Yes Adam Gus Rankinobert Jaffe, DO  hydrOXYzine (ATARAX/VISTARIL) 50 MG tablet Take 1 tablet (50 mg total) by mouth every 6 (six) hours as needed for anxiety. 02/23/14  Yes Evlyn KannerPamela S Love, PA-C  lipase/protease/amylase (CREON-12/PANCREASE) 12000 UNITS CPEP capsule  Take 2 capsules by mouth 3 (three) times daily with meals. 07/31/13  Yes Catarina Hartshornavid Tat, MD  naphazoline (NAPHCON) 0.1 % ophthalmic solution Place 1 drop into both eyes 4 (four) times daily -  with meals and at bedtime. 02/23/14  Yes Evlyn KannerPamela S Love, PA-C  oxyCODONE 10 MG TABS Take 1 tablet (10 mg total) by mouth every 6 (six) hours as needed for severe pain. 02/23/14  Yes Evlyn KannerPamela S Love, PA-C  pantoprazole sodium (PROTONIX) 40 mg/20 mL PACK Take 20 mLs (40 mg total) by mouth daily. 02/23/14  Yes Evlyn KannerPamela S Love, PA-C  promethazine (PHENERGAN) 25 MG tablet Take 1 tablet (25 mg total) by mouth every 6 (six) hours as needed for nausea or vomiting. 10/27/13  Yes Tatyana A Kirichenko, PA-C  traMADol (ULTRAM) 50 MG tablet Take 1 tablet (50 mg total) by mouth 4 (four) times daily -  with meals and at bedtime. 02/23/14  Yes Evlyn KannerPamela S Love, PA-C  Triamcinolone Acetonide (TRIAMCINOLONE 0.1 % CREAM : EUCERIN) CREA Apply 1 application topically 3 (three) times daily. 02/23/14  Yes Pamela S Love, PA-C   BP 128/98  Pulse 121  Temp(Src) 98.5 F (36.9 C) (Oral)  Resp 18  SpO2 100%  LMP 01/27/2014 Physical Exam  Constitutional: She is oriented to person, place, and time. She appears well-developed and well-nourished. No distress.  HENT:  Head: Normocephalic.  Neck: Normal range of motion.  Cardiovascular: Normal rate.   No murmur heard. Pulmonary/Chest: Effort normal. She has no wheezes. She has no rales.  Abdominal: Soft.  Morbidly obese abdomen. Tenderness greatest in epigastrium extending to bilateral upper quadrants.   Musculoskeletal: Normal range of motion.  Neurological: She is oriented to person, place, and time.  Skin: Skin is warm and dry.  Psychiatric: She has a normal mood and affect.    ED Course  Procedures (including critical care time) Labs Review Labs Reviewed  CBC WITH DIFFERENTIAL - Abnormal; Notable for the following:    WBC 15.8 (*)    Neutrophils Relative % 79 (*)    Neutro Abs 12.4 (*)     Monocytes Absolute 1.2 (*)    All other components within normal limits  COMPREHENSIVE METABOLIC PANEL  LIPASE, BLOOD    Imaging Review No results found.   EKG Interpretation None      MDM   Final diagnoses:  None    1. Pancreatitis  Lipase greater than 1200 today indicating acute pancreatitis in a well appearing patient. Pain somewhat difficult to control. No vomiting in ED. She appears stable. Discussed admission with Dr. Gerri LinsSwayze of [facility] who accepts the patient for admission    Arnoldo HookerShari A Shylie Polo, PA-C 03/11/14 1725

## 2014-03-11 NOTE — ED Notes (Signed)
Pt sts she wants to have PICC line inserted as soon as possible. Floor RN notified in report.

## 2014-03-11 NOTE — ED Provider Notes (Signed)
Angiocath insertion Performed by: Dagmar Hait  Consent: Verbal consent obtained. Risks and benefits: risks, benefits and alternatives were discussed Time out: Immediately prior to procedure a "time out" was called to verify the correct patient, procedure, equipment, support staff and site/side marked as required.  Preparation: Patient was prepped and draped in the usual sterile fashion.  Vein Location: R AC  Yes Ultrasound Guided  Gauge: 20  Normal blood return and flush without difficulty Patient tolerance: Patient tolerated the procedure well with no immediate complications.   Nurses needed help with IV. I placed one under US guidance in R AC.  Dagmar Hait, MD 03/11/14 [phone]

## 2014-03-11 NOTE — H&P (Signed)
Mary [name] is an 38 y.o. female.   Chief Complaint: Abdominal pain, nausea and vomiting. HPI: Pt is a 38 yr old morbidly obese woman who was discharged from the hospital 3 weeks ago after a stay for asthma exacerbation.  She states that she awoke this morning with abdominal pain and then developed nausea and vomiting. Pt has had repeated episodes of pancreatitis and she is familiar with these symptoms. She states that the pain is 8/10 in severity, epigastric, and without palliative and provocative factors.  Past Medical History  Diagnosis Date  . Hernia 03-24-13    ventral hernia remains   . GERD (gastroesophageal reflux disease)   . Pancreatitis 03-24-13    2002, 2'2013, 03-14-13  . Pancreatic cyst 2002 onset  . Anxiety   . Depression     "recent breakup with partner of 15 yrs"  . Adrenal insufficiency 04/23/2013    ??  . MS (multiple sclerosis)     Past Surgical History  Procedure Laterality Date  . Abdominal surgery  ~ 2007    some sort of pancreatic cyst drainage.   . Cholecystectomy      ~ age 83-laparoscopic  . Adenoidectomy    . 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 Eus N/A 04/14/2013    Procedure: UPPER ENDOSCOPIC ULTRASOUND (EUS) LINEAR;  Surgeon: Milus Banister, MD;  Location: [facility];  Service: Endoscopy;  Laterality: N/A;    Family History  Problem Relation Age of Onset  . Lupus Neg Hx   . Sarcoidosis Neg Hx   . Pancreatitis Neg Hx   . Ataxia Neg Hx   . Chorea Neg Hx   . Dementia Neg Hx   . Mental retardation Neg Hx   . Migraines Neg Hx   . Multiple sclerosis Neg Hx   . Neurofibromatosis Neg Hx   . Neuropathy Neg Hx   . Parkinsonism Neg Hx   . Seizures Neg Hx   . Stroke Neg Hx   . Colitis Maternal Aunt   . Diabetes Mother   . Diabetes Father   . Diabetes      many family members   Social History:  reports that she quit smoking about 9 months ago. Her smoking use included Cigarettes. She has a 10  pack-year smoking history. She has never used smokeless tobacco. She reports that she does not drink alcohol or use illicit drugs.  Allergies:  Allergies  Allergen Reactions  . Amoxicillin Anaphylaxis  . Penicillins Anaphylaxis  . Latex Itching  . Morphine And Related Nausea And Vomiting    Medications Prior to Admission  Medication Sig Dispense Refill  . b complex vitamins tablet Take 1 tablet by mouth daily.      . cholecalciferol (VITAMIN D) 1000 UNITS tablet Take 5,000 Units by mouth daily.      . cyclobenzaprine (FLEXERIL) 5 MG tablet Take 1 tablet (5 mg total) by mouth 3 (three) times daily as needed for muscle spasms.  45 tablet  1  . Glatiramer Acetate (COPAXONE) 40 MG/ML SOSY Inject 1 Syringe into the skin 3 (three) times a week.  12 Syringe  0  . hydrOXYzine (ATARAX/VISTARIL) 50 MG tablet Take 1 tablet (50 mg total) by mouth every 6 (six) hours as needed for anxiety.  60 tablet  0  . lipase/protease/amylase (CREON-12/PANCREASE) 12000 UNITS CPEP capsule Take 2 capsules by mouth 3 (three) times daily with meals.  180 capsule  2  . naphazoline (NAPHCON) 0.1 % ophthalmic solution Place 1 drop into both eyes 4 (four) times daily -  with meals and at bedtime.  15 mL  0  . oxyCODONE 10 MG TABS Take 1 tablet (10 mg total) by mouth every 6 (six) hours as needed for severe pain.  120 tablet  0  . pantoprazole sodium (PROTONIX) 40 mg/20 mL PACK Take 20 mLs (40 mg total) by mouth daily.  30 each  1  . promethazine (PHENERGAN) 25 MG tablet Take 1 tablet (25 mg total) by mouth every 6 (six) hours as needed for nausea or vomiting.  15 tablet  0  . traMADol (ULTRAM) 50 MG tablet Take 1 tablet (50 mg total) by mouth 4 (four) times daily -  with meals and at bedtime.  120 tablet  1  . Triamcinolone Acetonide (TRIAMCINOLONE 0.1 % CREAM : EUCERIN) CREA Apply 1 application topically 3 (three) times daily.  1 each  0    Results for orders placed during the hospital encounter of 03/10/14 (from the past  48 hour(s))  CBC WITH DIFFERENTIAL     Status: Abnormal   Collection Time    03/10/14 10:52 PM      Result Value Ref Range   WBC 15.8 (*) 4.0 - 10.5 K/uL   RBC 4.50  3.87 - 5.11 MIL/uL   Hemoglobin 12.7  12.0 - 15.0 g/dL   HCT 38.2  36.0 - 46.0 %   MCV 84.9  78.0 - 100.0 fL   MCH 28.2  26.0 - 34.0 pg   MCHC 33.2  30.0 - 36.0 g/dL   RDW 13.9  11.5 - 15.5 %   Platelets 319  150 - 400 K/uL   Neutrophils Relative % 79 (*) 43 - 77 %   Neutro Abs 12.4 (*) 1.7 - 7.7 K/uL   Lymphocytes Relative 12  12 - 46 %   Lymphs Abs 1.9  0.7 - 4.0 K/uL   Monocytes Relative 7  3 - 12 %   Monocytes Absolute 1.2 (*) 0.1 - 1.0 K/uL   Eosinophils Relative 2  0 - 5 %   Eosinophils Absolute 0.3  0.0 - 0.7 K/uL   Basophils Relative 0  0 - 1 %   Basophils Absolute 0.0  0.0 - 0.1 K/uL  COMPREHENSIVE METABOLIC PANEL     Status: Abnormal   Collection Time    03/10/14 10:52 PM      Result Value Ref Range   Sodium 139  137 - 147 mEq/L   Potassium 4.0  3.7 - 5.3 mEq/L   Chloride 105  96 - 112 mEq/L   CO2 20  19 - 32 mEq/L   Glucose, Bld 113 (*) 70 - 99 mg/dL   BUN 5 (*) 6 - 23 mg/dL   Creatinine, Ser 0.48 (*) 0.50 - 1.10 mg/dL   Calcium 9.4  8.4 - 10.5 mg/dL   Total Protein 8.3  6.0 - 8.3 g/dL   Albumin 3.8  3.5 - 5.2 g/dL   AST 29  0 - 37 U/L   ALT 30  0 - 35 U/L   Alkaline Phosphatase 96  39 - 117 U/L   Total Bilirubin <0.2 (*) 0.3 - 1.2 mg/dL   GFR calc non Af Amer >90  >90 mL/min   GFR calc Af Amer >90  >90 mL/min   Comment: (NOTE)     The eGFR has been calculated using the CKD EPI equation.  This calculation has not been validated in all clinical situations.     eGFR's persistently <90 mL/min signify possible Chronic Kidney     Disease.   Anion gap 14  5 - 15  LIPASE, BLOOD     Status: Abnormal   Collection Time    03/10/14 10:52 PM      Result Value Ref Range   Lipase 1270 (*) 11 - 59 U/L   No results found.  Review of Systems  Constitutional: Positive for malaise/fatigue. Negative  for fever and chills.  HENT: Negative for congestion and sore throat.   Eyes: Negative for blurred vision, double vision, photophobia and redness.  Respiratory: Negative for cough, hemoptysis, sputum production, shortness of breath, wheezing and stridor.   Cardiovascular: Negative for chest pain, palpitations, orthopnea, claudication and leg swelling.  Gastrointestinal: Positive for nausea, vomiting and abdominal pain. Negative for heartburn, diarrhea and constipation.       Pt with epigastric abdominal pain. 8/10 in severity and constrant since this morning.  The pain is without radiation, palliative or provocative factors.  Genitourinary: Negative for dysuria, urgency, frequency, hematuria and flank pain.  Musculoskeletal: Negative for joint pain, myalgias and neck pain.  Skin: Negative for itching and rash.  Neurological: Positive for weakness. Negative for dizziness, tingling, focal weakness, seizures and headaches.  Endo/Heme/Allergies: Negative for environmental allergies. Does not bruise/bleed easily.  Psychiatric/Behavioral: Negative for depression, suicidal ideas and substance abuse. The patient is not nervous/anxious.     Blood pressure 119/83, pulse 77, temperature 98.3 F (36.8 C), temperature source Oral, resp. rate 18, height 5' 4"  (1.626 m), last menstrual period 01/27/2014, SpO2 98.00%. Physical Exam  Constitutional: She is oriented to person, place, and time. She appears well-nourished. She appears distressed.  Pt is morbidly obese, but awake, alert and oriented x3.  HENT:  Head: Normocephalic and atraumatic.  Nose: Nose normal.  Mouth/Throat: No oropharyngeal exudate.  Eyes: Conjunctivae and EOM are normal. Pupils are equal, round, and reactive to light. No scleral icterus.  Neck: Normal range of motion. Neck supple. No JVD present. No tracheal deviation present. No thyromegaly present.  Cardiovascular: Normal rate, regular rhythm and intact distal pulses.  Exam reveals no  gallop and no friction rub.   No murmur heard. Respiratory: No respiratory distress. She has no wheezes. She has no rales. She exhibits no tenderness.  GI: She exhibits no distension and no mass. There is tenderness. There is guarding. There is no rebound.  Epigastric tenderness, and guarding.   Musculoskeletal: She exhibits no edema and no tenderness.  Lymphadenopathy:    She has no cervical adenopathy.  Neurological: She is alert and oriented to person, place, and time. She has normal reflexes. She displays normal reflexes. No cranial nerve deficit. She exhibits normal muscle tone. Coordination normal.  Skin: Skin is warm and dry. No rash noted. She is not diaphoretic. No erythema. No pallor.  Psychiatric: She has a normal mood and affect. Her behavior is normal. Judgment and thought content normal.     Assessment/Plan 1. Pancreatitis - Pt will be kept NPO. She will receive IV fluids, antiemetics, and pain control. 2. Abdominal pain - pain control 3. Leukocytosis indicating an infectious component to the pancreatitis.  I will start her on antibiotics appropriate to treat intra-abdominal infection. 5. Nausea and vomiting - antiemetics 6. Multiple Sclerosis - I will continue the patient on her home medications.  Jeena Arnett 03/11/2014, 3:25 AM

## 2014-03-11 NOTE — Progress Notes (Signed)
Patient ID: Mary [name], female   DOB: [birth year], 38 y.o.   MRN: [id number] [facility] PROGRESS NOTE  Lavonne ChickHaley M Diliberto WUJ:[id number] DOB: [birth year] DOA: 03/10/2014 PCP: No PCP Per Patient  Brief narrative: 38 yr old morbidly obese woman who was discharged from the hospital 3 weeks ago after a stay for asthma exacerbation, presents with main concern of several days duration of progressively worsening abdominal quadrant pain, nausea, poor oral intake. In ED, lipase noted to be > 1200 and she was referred for an admission.   Active Problems:   Acute pancreatitis  - still in pain but clinically improving - no clear etiology, pt denies alcohol use - will check lipid panel and abdominal US  - lipase is trending down - continue IVF, analgesia, antiemetics as needed  - pt no clear indication for ABX, pt is afebrile, will stop flagyl and Levaquin    Multiple sclerosis  - outpatient follow up  Consultants:  None   Procedures/Studies:  None  Antibiotics:  None   Code Status: Full Family Communication: Pt at bedside Disposition Plan: Home when medically stable  HPI/Subjective: No events overnight.   Objective: Filed Vitals:   03/10/14 2134 03/11/14 0144 03/11/14 0203 03/11/14 0403  BP: 128/98 102/53 119/83 103/69  Pulse: 121 86 77 69  Temp: 98.5 F (36.9 C) 98.1 F (36.7 C) 98.3 F (36.8 C) 98.3 F (36.8 C)  TempSrc: Oral Oral Oral Oral  Resp: 18 18 18 16   Height:   5\' 4"  (1.626 m)   SpO2: 100% 96% 98% 99%    Intake/Output Summary (Last 24 hours) at 03/11/14 0700 Last data filed at 03/11/14 0533  Gross per 24 hour  Intake    580 ml  Output      0 ml  Net    580 ml    Exam:   General:  Pt is alert, follows commands appropriately, not in acute distress  Cardiovascular: Regular rate and rhythm, S1/S2, no murmurs, no rubs, no gallops  Respiratory: Clear to auscultation bilaterally, no wheezing, no crackles, no rhonchi  Abdomen: Soft, tender in upper  abd quadrants, non distended, bowel sounds present, no guarding  Extremities: No edema, pulses DP and PT palpable bilaterally  Neuro: Grossly nonfocal  Data Reviewed: Basic Metabolic Panel:  Recent Labs Lab 03/10/14 2252  NA 139  K 4.0  CL 105  CO2 20  GLUCOSE 113*  BUN 5*  CREATININE 0.48*  CALCIUM 9.4   Liver Function Tests:  Recent Labs Lab 03/10/14 2252  AST 29  ALT 30  ALKPHOS 96  BILITOT <0.2*  PROT 8.3  ALBUMIN 3.8    Recent Labs Lab 03/10/14 2252  LIPASE 1270*   CBC:  Recent Labs Lab 03/10/14 2252  WBC 15.8*  NEUTROABS 12.4*  HGB 12.7  HCT 38.2  MCV 84.9  PLT 319   Scheduled Meds: . sodium chloride   Intravenous STAT  . enoxaparin (LOVENOX) injection  40 mg Subcutaneous Q24H  . [START ON 03/13/2014] Glatiramer Acetate  1 Syringe Subcutaneous Once per day on Mon Wed Fri  . levofloxacin (LEVAQUIN) IV  750 mg Intravenous Q0600  . metronidazole  500 mg Intravenous 3 times per day  . naphazoline  1 drop Both Eyes TID WC & HS  . pantoprazole sodium  40 mg Oral Daily  . traMADol  50 mg Oral TID WC & HS   Continuous Infusions: . sodium chloride 100 mL/hr at 03/11/14 [id number]   Debbora PrestoMAGICK-Chamika Cunanan, MD  [facility]  Pager [phone]  If 7PM-7AM, please contact night-coverage [email or web address] Password [email or web address] 03/11/2014, 7:00 AM   LOS: 1 day

## 2014-03-12 LAB — COMPREHENSIVE METABOLIC PANEL
ALT: 25 U/L (ref 0–35)
AST: 22 U/L (ref 0–37)
Albumin: 3.4 g/dL — ABNORMAL LOW (ref 3.5–5.2)
Alkaline Phosphatase: 86 U/L (ref 39–117)
Anion gap: 11 (ref 5–15)
BILIRUBIN TOTAL: 0.3 mg/dL (ref 0.3–1.2)
BUN: 11 mg/dL (ref 6–23)
CHLORIDE: 104 meq/L (ref 96–112)
CO2: 25 meq/L (ref 19–32)
Calcium: 9 mg/dL (ref 8.4–10.5)
Creatinine, Ser: 0.66 mg/dL (ref 0.50–1.10)
GFR calc Af Amer: >90 mL/min (ref >90)
Glucose, Bld: 98 mg/dL (ref 70–99)
Potassium: 4.4 mEq/L (ref 3.7–5.3)
SODIUM: 140 meq/L (ref 137–147)
Total Protein: 7.3 g/dL (ref 6.0–8.3)

## 2014-03-12 LAB — CBC
HCT: 34.7 % — ABNORMAL LOW (ref 36.0–46.0)
Hemoglobin: 11 g/dL — ABNORMAL LOW (ref 12.0–15.0)
MCH: 27.5 pg (ref 26.0–34.0)
MCHC: 31.7 g/dL (ref 30.0–36.0)
MCV: 86.8 fL (ref 78.0–100.0)
PLATELETS: 301 10*3/uL (ref 150–400)
RBC: 4 MIL/uL (ref 3.87–5.11)
RDW: 14.3 % (ref 11.5–15.5)
WBC: 9.2 10*3/uL (ref 4.0–10.5)

## 2014-03-12 LAB — LIPASE, BLOOD: Lipase: 98 U/L — ABNORMAL HIGH (ref 11–59)

## 2014-03-12 MED ORDER — HYDROXYZINE HCL 50 MG PO TABS: 50.0000 mg | ORAL_TABLET | Freq: Four times a day (QID) | ORAL | Status: DC | PRN

## 2014-03-12 MED ORDER — PROMETHAZINE HCL 25 MG PO TABS: 25.0000 mg | ORAL_TABLET | Freq: Four times a day (QID) | ORAL | Status: DC | PRN

## 2014-03-12 MED ORDER — CYCLOBENZAPRINE HCL 5 MG PO TABS: 5.0000 mg | ORAL_TABLET | Freq: Three times a day (TID) | ORAL | Status: DC | PRN

## 2014-03-12 MED ORDER — HYDROMORPHONE HCL 4 MG PO TABS: 4.0000 mg | ORAL_TABLET | ORAL | Status: DC | PRN

## 2014-03-12 NOTE — Discharge Summary (Signed)
Physician Discharge Summary  Edynn Gillock Barreras ZOX:[id number] DOB: June 10, 1976 DOA: 03/10/2014  PCP: No PCP Per Patient  Admit date: 03/10/2014 Discharge date: 03/12/2014  Recommendations for Outpatient Follow-up:  1. Pt will need to follow up with PCP in 2-3 weeks post discharge 2. Please obtain BMP to evaluate electrolytes and kidney function 3. Please also check CBC to evaluate Hg and Hct levels  Discharge Diagnoses: Acute pancreatitis  Active Problems:   Relapsing remitting multiple sclerosis   Idiopathic acute pancreatitis   Nausea and vomiting   Abdominal pain, epigastric  Discharge Condition: Stable  Diet recommendation: Slowly advance diet as tolerated   Brief narrative:  38 yr old morbidly obese woman who was discharged from the hospital 3 weeks ago after a stay for asthma exacerbation, presents with main concern of several days duration of progressively worsening abdominal quadrant pain, nausea, poor oral intake. In ED, lipase noted to be > 1200 and she was referred for an admission.   Active Problems:  Acute pancreatitis  - clinically improving and tolerating full liquid diet well - pt denies alcohol use, lipid panel stable, no acute findings on abd [name]  - lipase is trending down  - analgesia, antiemetics as needed upon discharge and slowly to advance diet  - pt no clear indication for ABX, pt is afebrile and  WBC is WNL  Multiple sclerosis  - outpatient follow up   Consultants:  None  Procedures/Studies:  None Antibiotics:  None   Code Status: Full  Family Communication: Pt at bedside  Disposition Plan: Home today   Discharge Exam: Filed Vitals:   03/12/14 0405  BP: 96/69  Pulse: 75  Temp: 97.4 F (36.3 C)  Resp: 18   Filed Vitals:   03/11/14 0403 03/11/14 1500 03/11/14 2224 03/12/14 0405  BP: 103/69 107/73 116/62 96/69  Pulse: 69 67 85 75  Temp: 98.3 F (36.8 C) 97.7 F (36.5 C) 98.1 F (36.7 C) 97.4 F (36.3 C)  TempSrc: Oral Oral  Oral  Resp:  16 18 16 18   Height:      SpO2: 99% 96% 97% 98%    General: Pt is alert, follows commands appropriately, not in acute distress Cardiovascular: Regular rate and rhythm, S1/S2 +, no murmurs, no rubs, no gallops Respiratory: Clear to auscultation bilaterally, no wheezing, no crackles, no rhonchi Abdominal: Soft, non tender, non distended, bowel sounds +, no guarding Extremities: no edema, no cyanosis, pulses palpable bilaterally DP and PT Neuro: Grossly nonfocal  Discharge Instructions     Medication List    STOP taking these medications       Oxycodone HCl 10 MG Tabs     traMADol 50 MG tablet  Commonly known as:  ULTRAM      TAKE these medications       b complex vitamins tablet  Take 1 tablet by mouth daily.     cholecalciferol 1000 UNITS tablet  Commonly known as:  VITAMIN D  Take 5,000 Units by mouth daily.     cyclobenzaprine 5 MG tablet  Commonly known as:  FLEXERIL  Take 1 tablet (5 mg total) by mouth 3 (three) times daily as needed for muscle spasms.     Glatiramer Acetate 40 MG/ML Sosy  Commonly known as:  COPAXONE  Inject 1 Syringe into the skin 3 (three) times a week.     HYDROmorphone 4 MG tablet  Commonly known as:  DILAUDID  Take 1 tablet (4 mg total) by mouth every 4 (four) hours as  needed for severe pain.     hydrOXYzine 50 MG tablet  Commonly known as:  ATARAX/VISTARIL  Take 1 tablet (50 mg total) by mouth every 6 (six) hours as needed for anxiety.     lipase/protease/amylase 1610912000 UNITS Cpep capsule  Commonly known as:  CREON-12/PANCREASE  Take 2 capsules by mouth 3 (three) times daily with meals.     naphazoline 0.1 % ophthalmic solution  Commonly known as:  NAPHCON  Place 1 drop into both eyes 4 (four) times daily -  with meals and at bedtime.     pantoprazole sodium 40 mg/20 mL Pack  Commonly known as:  PROTONIX  Take 20 mLs (40 mg total) by mouth daily.     promethazine 25 MG tablet  Commonly known as:  PHENERGAN  Take 1 tablet (25  mg total) by mouth every 6 (six) hours as needed for nausea or vomiting.     triamcinolone 0.1 % cream : eucerin Crea  Apply 1 application topically 3 (three) times daily.           Follow-up Information   Follow up with Debbora PrestoMAGICK-Khing Belcher, MD. (As needed, call my cell phone [phone])    Specialty:  Internal Medicine   Contact information:   [address] [place] [address] [phone]       Schedule an appointment as soon as possible for a [facility] with [facility].   Specialty:  Gastroenterology   Contact information:   [address] [place] [phone]       The results of significant diagnostics from this hospitalization (including imaging, microbiology, ancillary and laboratory) are listed below for reference.     Microbiology: No results found for this or any previous visit (from the past 240 hour(s)).   Labs: Basic Metabolic Panel:  Recent Labs Lab 03/10/14 2252 03/11/14 0655 03/12/14 0500  NA 139 141 140  K 4.0 3.9 4.4  CL 105 106 104  CO2 20 23 25   GLUCOSE 113* 117* 98  BUN 5* 8 11  CREATININE 0.48* 0.56 0.66  CALCIUM 9.4 9.1 9.0   Liver Function Tests:  Recent Labs Lab 03/10/14 2252 03/11/14 0655 03/12/14 0500  AST 29 19 22   ALT 30 24 25   ALKPHOS 96 88 86  BILITOT <0.2* 0.3 0.3  PROT 8.3 7.6 7.3  ALBUMIN 3.8 3.2* 3.4*    Recent Labs Lab 03/10/14 2252 03/11/14 0655 03/12/14 0500  LIPASE 1270* 523* 98*   CBC:  Recent Labs Lab 03/10/14 2252 03/11/14 0655 03/12/14 0500  WBC 15.8* 9.7 9.2  NEUTROABS 12.4*  --   --   HGB 12.7 11.6* 11.0*  HCT 38.2 36.2 34.7*  MCV 84.9 85.6 86.8  PLT 319 304 301   SIGNED: Time coordinating discharge: Over 30 minutes  Debbora PrestoMAGICK-Adylee Leonardo, MD  [facility] 03/12/2014, 9:29 AM Pager [phone]  If 7PM-7AM, please contact night-coverage [email or web address] Password [email or web address]

## 2014-03-12 NOTE — Progress Notes (Signed)
Pt up ambulating in the room, tolerated well . IV'dc'd. Friend in to pick pt up and bring home. Discharge instructions explained to pt and 4 prescriptions given to pt. Pt refuses wheel chair to be dc'd.

## 2014-03-12 NOTE — Discharge Instructions (Signed)

## 2014-03-13 ENCOUNTER — Encounter (HOSPITAL_COMMUNITY): Payer: Self-pay | Admitting: Emergency Medicine

## 2014-03-13 ENCOUNTER — Emergency Department (HOSPITAL_COMMUNITY)
Admission: EM | Admit: 2014-03-13 | Discharge: 2014-03-13 | Disposition: A | Discharge status: Home / Self Care | Payer: Medicaid Other | Attending: Emergency Medicine | Admitting: Emergency Medicine

## 2014-03-13 DIAGNOSIS — Z87891 Personal history of nicotine dependence: Secondary | ICD-10-CM | POA: Diagnosis not present

## 2014-03-13 DIAGNOSIS — R109 Unspecified abdominal pain: Secondary | ICD-10-CM | POA: Diagnosis present

## 2014-03-13 DIAGNOSIS — Z880 Allergy status to penicillin: Secondary | ICD-10-CM | POA: Diagnosis not present

## 2014-03-13 DIAGNOSIS — R1013 Epigastric pain: Secondary | ICD-10-CM | POA: Diagnosis not present

## 2014-03-13 DIAGNOSIS — Z8669 Personal history of other diseases of the nervous system and sense organs: Secondary | ICD-10-CM | POA: Diagnosis not present

## 2014-03-13 DIAGNOSIS — Z79899 Other long term (current) drug therapy: Secondary | ICD-10-CM | POA: Diagnosis not present

## 2014-03-13 DIAGNOSIS — Z8719 Personal history of other diseases of the digestive system: Secondary | ICD-10-CM | POA: Diagnosis not present

## 2014-03-13 DIAGNOSIS — Z8659 Personal history of other mental and behavioral disorders: Secondary | ICD-10-CM | POA: Insufficient documentation

## 2014-03-13 LAB — COMPREHENSIVE METABOLIC PANEL
ALK PHOS: 84 U/L (ref 39–117)
ALT: 28 U/L (ref 0–35)
AST: 28 U/L (ref 0–37)
Albumin: 3.4 g/dL — ABNORMAL LOW (ref 3.5–5.2)
Anion gap: 13 (ref 5–15)
BUN: 6 mg/dL (ref 6–23)
CALCIUM: 8.6 mg/dL (ref 8.4–10.5)
CO2: 21 mEq/L (ref 19–32)
Chloride: 107 mEq/L (ref 96–112)
Creatinine, Ser: 0.5 mg/dL (ref 0.50–1.10)
GFR calc Af Amer: >90 mL/min (ref >90)
GFR calc non Af Amer: >90 mL/min (ref >90)
Glucose, Bld: 104 mg/dL — ABNORMAL HIGH (ref 70–99)
POTASSIUM: 3.9 meq/L (ref 3.7–5.3)
Sodium: 141 mEq/L (ref 137–147)
TOTAL PROTEIN: 7.4 g/dL (ref 6.0–8.3)
Total Bilirubin: <0.2 mg/dL — ABNORMAL LOW (ref 0.3–1.2)

## 2014-03-13 LAB — CBC WITH DIFFERENTIAL/PLATELET
BASOS ABS: 0 10*3/uL (ref 0.0–0.1)
Basophils Relative: 0 % (ref 0–1)
EOS PCT: 5 % (ref 0–5)
Eosinophils Absolute: 0.3 10*3/uL (ref 0.0–0.7)
HCT: 34.3 % — ABNORMAL LOW (ref 36.0–46.0)
Hemoglobin: 11.2 g/dL — ABNORMAL LOW (ref 12.0–15.0)
Lymphocytes Relative: 20 % (ref 12–46)
Lymphs Abs: 1.3 10*3/uL (ref 0.7–4.0)
MCH: 27.7 pg (ref 26.0–34.0)
MCHC: 32.7 g/dL (ref 30.0–36.0)
MCV: 84.9 fL (ref 78.0–100.0)
Monocytes Absolute: 0.7 10*3/uL (ref 0.1–1.0)
Monocytes Relative: 10 % (ref 3–12)
NEUTROS ABS: 4.5 10*3/uL (ref 1.7–7.7)
Neutrophils Relative %: 65 % (ref 43–77)
PLATELETS: 280 10*3/uL (ref 150–400)
RBC: 4.04 MIL/uL (ref 3.87–5.11)
RDW: 13.9 % (ref 11.5–15.5)
WBC: 6.8 10*3/uL (ref 4.0–10.5)

## 2014-03-13 LAB — LIPASE, BLOOD: Lipase: 37 U/L (ref 11–59)

## 2014-03-13 MED ORDER — HYDROMORPHONE HCL PF 1 MG/ML IJ SOLN
2.0000 mg | Freq: Once | INTRAMUSCULAR | Status: AC
  Administered 2014-03-13: 2 mg via INTRAVENOUS
  Filled 2014-03-13: qty 2

## 2014-03-13 MED ORDER — SODIUM CHLORIDE 0.9 % IV BOLUS (SEPSIS)
1000.0000 mL | Freq: Once | INTRAVENOUS | Status: AC
  Administered 2014-03-13: 1000 mL via INTRAVENOUS

## 2014-03-13 MED ORDER — FENTANYL CITRATE 0.05 MG/ML IJ SOLN: 100.0000 ug | Freq: Once | INTRAMUSCULAR | Status: DC

## 2014-03-13 MED ORDER — PROMETHAZINE HCL 25 MG/ML IJ SOLN: 12.5000 mg | Freq: Once | INTRAMUSCULAR | Status: DC

## 2014-03-13 MED ORDER — HYDROMORPHONE HCL PF 1 MG/ML IJ SOLN
1.0000 mg | Freq: Once | INTRAMUSCULAR | Status: AC
  Administered 2014-03-13: 1 mg via INTRAVENOUS
  Filled 2014-03-13: qty 1

## 2014-03-13 MED ORDER — PROMETHAZINE HCL 25 MG/ML IJ SOLN
25.0000 mg | Freq: Once | INTRAMUSCULAR | Status: AC
  Administered 2014-03-13: 25 mg via INTRAVENOUS
  Filled 2014-03-13: qty 1

## 2014-03-13 NOTE — ED Provider Notes (Signed)
Medical screening examination/treatment/procedure(s) were performed by non-physician practitioner and as supervising physician I was immediately available for consultation/collaboration.   EKG Interpretation None       Laquinta Hazell M [facility], MD 03/13/14 2205 

## 2014-03-13 NOTE — ED Notes (Signed)
Per pt recently admitted for severe abdominal pain and elevated lipase. sts released yesterday and woke up this am and pain is back with N,V.

## 2014-03-13 NOTE — Discharge Instructions (Signed)
Low-Fat Diet for Pancreatitis or Gallbladder Conditions °A low-fat diet can be helpful if you have pancreatitis or a gallbladder condition. With these conditions, your pancreas and gallbladder have trouble digesting fats. A healthy eating plan with less fat will help rest your pancreas and gallbladder and reduce your symptoms. °WHAT DO I NEED TO KNOW ABOUT THIS DIET? °· Eat a low-fat diet. °¨ Reduce your fat intake to less than 20-30% of your total daily calories. This is less than 50-60 g of fat per day. °¨ Remember that you need some fat in your diet. Ask your dietician what your daily goal should be. °¨ Choose nonfat and low-fat healthy foods. Look for the words "nonfat," "low fat," or "fat free." °¨ As a guide, look on the label and choose foods with less than 3 g of fat per serving. Eat only one serving. °· Avoid alcohol. °· Do not smoke. If you need help quitting, talk with your health care provider. °· Eat small frequent meals instead of three large heavy meals. °WHAT FOODS CAN I EAT? °Grains °Include healthy grains and starches such as potatoes, wheat bread, fiber-rich cereal, and brown rice. Choose whole grain options whenever possible. In adults, whole grains should account for 45-65% of your daily calories.  °Fruits and Vegetables °Eat plenty of fruits and vegetables. Fresh fruits and vegetables add fiber to your diet. °Meats and Other Protein Sources °Eat lean meat such as chicken and pork. Trim any fat off of meat before cooking it. Eggs, fish, and beans are other sources of protein. In adults, these foods should account for 10-35% of your daily calories. °Dairy °Choose low-fat milk and dairy options. Dairy includes fat and protein, as well as calcium.  °Fats and Oils °Limit high-fat foods such as fried foods, sweets, baked goods, sugary drinks.  °Other °Creamy sauces and condiments, such as mayonnaise, can add extra fat. Think about whether or not you need to use them, or use smaller amounts or low fat  options. °WHAT FOODS ARE NOT RECOMMENDED? °· High fat foods, such as: °¨ Baked goods. °¨ Ice cream. °¨ French toast. °¨ Sweet rolls. °¨ Pizza. °¨ Cheese bread. °¨ Foods covered with batter, butter, creamy sauces, or cheese. °¨ Fried foods. °¨ Sugary drinks and desserts. °· Foods that cause gas or bloating °Document Released: 08/09/2013 Document Reviewed: 08/09/2013 °[employer]® Patient Information ©2015 [employer]. This information is not intended to replace advice given to you by your health care provider. Make sure you discuss any questions you have with your health care provider. ° °

## 2014-03-13 NOTE — ED Provider Notes (Signed)
CSN: [id number]     Arrival date & time 03/13/14  1144 History   First MD Initiated Contact with Patient 03/13/14 1210     Chief Complaint  Patient presents with  . Abdominal Pain     (Consider location/radiation/quality/duration/timing/severity/associated sxs/prior Treatment) HPI  Patient to the ER with complaints of severe epigastric pain. She was discharged from a 48 hour hospital stay for pain control of her pancreatitis. When she was admitted her lipase was > 1200 and when discharged it improved to 98. She went home feeling fine and ate Beans and Rice for meals "really bland stuff" she reports. Denies being told to do a clear liquid diet. She also admits that she was given an Rx for Dilaudid and nausea medication but has not yet gotten it filled. She started to hurt again this morning and has not taken anything before coming to the hospital. No fevers, nausea, or vomiting. Nurse note reports N/V, but patient denies these symptoms to me.  Past Medical History  Diagnosis Date  . Hernia 03-24-13    ventral hernia remains   . GERD (gastroesophageal reflux disease)   . Pancreatitis 03-24-13    2002, 2'2013, 03-14-13  . Pancreatic cyst 2002 onset  . Anxiety   . Depression     "recent breakup with partner of 15 yrs"  . Adrenal insufficiency 04/23/2013    ??  . MS (multiple sclerosis)    Past Surgical History  Procedure Laterality Date  . Abdominal surgery  ~ 2007    some sort of pancreatic cyst drainage.   . Cholecystectomy      ~ age 86-laparoscopic  . Adenoidectomy    . 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 Eus N/A 04/14/2013    Procedure: UPPER ENDOSCOPIC ULTRASOUND (EUS) LINEAR;  Surgeon: Rachael Fee, MD;  Location: [facility];  Service: Endoscopy;  Laterality: N/A;   Family History  Problem Relation Age of Onset  . Lupus Neg Hx   . Sarcoidosis Neg Hx   . Pancreatitis Neg Hx   . Ataxia Neg Hx   . Chorea Neg Hx   .  Dementia Neg Hx   . Mental retardation Neg Hx   . Migraines Neg Hx   . Multiple sclerosis Neg Hx   . Neurofibromatosis Neg Hx   . Neuropathy Neg Hx   . Parkinsonism Neg Hx   . Seizures Neg Hx   . Stroke Neg Hx   . Colitis Maternal Aunt   . Diabetes Mother   . Diabetes Father   . Diabetes      many family members   History  Substance Use Topics  . Smoking status: Former Smoker -- 0.50 packs/day for 20 years    Types: Cigarettes    Quit date: 05/26/2013  . Smokeless tobacco: Never Used  . Alcohol Use: No   OB History   Grav Para Term Preterm Abortions TAB SAB Ect Mult Living                 Review of Systems  Gastrointestinal: Positive for abdominal pain. Negative for nausea, vomiting, diarrhea and blood in stool.      Allergies  Amoxicillin; Penicillins; Latex; and Morphine and related  Home Medications   Prior to Admission medications   Medication Sig Start Date End Date Taking? Authorizing Provider  b complex vitamins tablet Take 1 tablet by mouth daily.   Yes Historical Provider, MD  cholecalciferol (VITAMIN D) 1000 UNITS tablet Take 5,000 Units by mouth daily.   Yes Historical Provider, MD  cyclobenzaprine (FLEXERIL) 5 MG tablet Take 1 tablet (5 mg total) by mouth 3 (three) times daily as needed for muscle spasms. 03/12/14  Yes Dorothea OgleIskra M Myers, MD  Glatiramer Acetate (COPAXONE) 40 MG/ML SOSY Inject 1 Syringe into the skin 3 (three) times a week. 09/12/13  Yes Adam Gus Rankinobert Jaffe, DO  hydrOXYzine (ATARAX/VISTARIL) 50 MG tablet Take 1 tablet (50 mg total) by mouth every 6 (six) hours as needed for anxiety. 03/12/14  Yes Dorothea OgleIskra M Myers, MD  lipase/protease/amylase (CREON-12/PANCREASE) 12000 UNITS CPEP capsule Take 2 capsules by mouth 3 (three) times daily with meals. 07/31/13  Yes Catarina Hartshornavid Tat, MD  promethazine (PHENERGAN) 25 MG tablet Take 1 tablet (25 mg total) by mouth every 6 (six) hours as needed for nausea or vomiting. 03/12/14  Yes Dorothea OgleIskra M Myers, MD  Triamcinolone Acetonide  (TRIAMCINOLONE 0.1 % CREAM : EUCERIN) CREA Apply 1 application topically 3 (three) times daily. 02/23/14  Yes Evlyn KannerPamela S Love, PA-C  HYDROmorphone (DILAUDID) 4 MG tablet Take 1 tablet (4 mg total) by mouth every 4 (four) hours as needed for severe pain. 03/12/14   Dorothea OgleIskra M Myers, MD  naphazoline (NAPHCON) 0.1 % ophthalmic solution Place 1 drop into both eyes 4 (four) times daily -  with meals and at bedtime. 02/23/14   Evlyn KannerPamela S Love, PA-C   BP 107/69  Pulse 81  Temp(Src) 97.8 F (36.6 C) (Oral)  Resp 20  SpO2 99%  LMP 01/27/2014 Physical Exam  Nursing note and vitals reviewed. Constitutional: She appears well-developed and well-nourished. No distress.  HENT:  Head: Normocephalic and atraumatic.  Eyes: Pupils are equal, round, and reactive to light.  Neck: Normal range of motion. Neck supple.  Cardiovascular: Normal rate and regular rhythm.   Pulmonary/Chest: Effort normal.  Abdominal: Soft. Bowel sounds are normal. There is tenderness in the epigastric area. There is no rigidity, no rebound, no guarding and no CVA tenderness.  Neurological: She is alert.  Skin: Skin is warm and dry.    ED Course  Procedures (including critical care time) Labs Review Labs Reviewed  CBC WITH DIFFERENTIAL - Abnormal; Notable for the following:    Hemoglobin 11.2 (*)    HCT 34.3 (*)    All other components within normal limits  COMPREHENSIVE METABOLIC PANEL - Abnormal; Notable for the following:    Glucose, Bld 104 (*)    Albumin 3.4 (*)    Total Bilirubin <0.2 (*)    All other components within normal limits  LIPASE, BLOOD    Imaging Review [place] Abdomen Complete  03/11/2014   CLINICAL DATA:  38 year old female with abdominal pain. Acute pancreatitis. History of cholecystectomy.  EXAM: ULTRASOUND ABDOMEN COMPLETE  COMPARISON:  07/30/2013 and prior CTs  FINDINGS: Gallbladder:  The gallbladder is not visualized compatible with cholecystectomy.  Common bile duct:  Diameter: 5.3 mm. There is no evidence of  intrahepatic or extrahepatic biliary dilatation.  Liver:  Diffuse increased echogenicity of the liver is compatible with fatty infiltration. No focal hepatic abnormalities are noted.  IVC:  No abnormality visualized.  Pancreas:  The pancreas is not well visualized secondary to overlying bowel gas and body habitus.  Spleen:  Size and appearance within normal limits.  Right Kidney:  Length: 12.8 cm. Echogenicity within normal limits. No mass or hydronephrosis visualized.  Left Kidney:  Length: 0.6 cm. Echogenicity within normal limits. No mass or hydronephrosis visualized.  Abdominal  aorta:  No aneurysm identified but portions of the abdominal aorta are not well visualized secondary to overlying bowel gas and body habitus.  Other findings:  None.  IMPRESSION: No evidence of acute abnormality.  Fatty infiltration of the liver.  Pancreas and portions of the abdominal aorta not well visualized secondary to overlying bowel gas and body habitus.   Electronically Signed   By: Laveda Abbe M.D.   On: 03/11/2014 15:20     EKG Interpretation None      MDM   Final diagnoses:  Epigastric pain    Medications  sodium chloride 0.9 % bolus 1,000 mL (1,000 mLs Intravenous New Bag/Given 03/13/14 1325)  HYDROmorphone (DILAUDID) injection 1 mg (1 mg Intravenous Given 03/13/14 1326)  HYDROmorphone (DILAUDID) injection 2 mg (2 mg Intravenous Given 03/13/14 1441)  promethazine (PHENERGAN) injection 25 mg (25 mg Intravenous Given 03/13/14 1415)   Patient given two rounds of pain medications with complete improvement of symptoms. She is no longer having pain and feels that she can have her ride take her to the pharmacy to get her PO medications. She has had no episodes of vomiting or diarrhea in the ED and her lipase  Is WNL in the ED. I do not feel that she needs admission and will do well at home. Discussed dietary recommendations.  38 y.o.Mary [name]'s evaluation in the Emergency Department is complete. It has been  determined that no acute conditions requiring further emergency intervention are present at this time. The patient/guardian have been advised of the diagnosis and plan. We have discussed signs and symptoms that warrant return to the ED, such as changes or worsening in symptoms.  Vital signs are stable at discharge. Filed Vitals:   03/13/14 1403  BP: 107/69  Pulse:   Temp:   Resp: 20    Patient/guardian has voiced understanding and agreed to follow-up with the PCP or specialist.     Dorthula Matas, PA-C 03/13/14 1457

## 2014-03-16 ENCOUNTER — Telehealth: Payer: Self-pay | Admitting: Neurology

## 2014-03-16 NOTE — Telephone Encounter (Signed)
Called patient back. She wanted to stop the copaxone because she wants to start a different medication. She was going to stop this now and follow up with Dr Everlena CooperJaffe at her scheduled appt in 3 weeks. I advised her to continue the medication until her appt, that she should not be off medication for 3 weeks, and then she can discuss what medications could better benefit her. She hesitantly agreed with this plan and will keep appt.

## 2014-03-16 NOTE — Telephone Encounter (Signed)
Pt called requesting to speak to a nurse regarding her meds.  C/B [phone]

## 2014-03-18 NOTE — ED Provider Notes (Signed)
Medical screening examination/treatment/procedure(s) were performed by non-physician practitioner and as supervising physician I was immediately available for consultation/collaboration.   EKG Interpretation None        Rolland Porter, MD 03/18/14 [phone]

## 2014-03-23 ENCOUNTER — Other Ambulatory Visit: Payer: Self-pay

## 2014-03-23 ENCOUNTER — Encounter: Payer: MEDICAID | Admitting: Registered Nurse

## 2014-04-01 ENCOUNTER — Emergency Department (HOSPITAL_COMMUNITY): Payer: Medicaid Other

## 2014-04-01 ENCOUNTER — Encounter (HOSPITAL_COMMUNITY): Payer: Self-pay | Admitting: Emergency Medicine

## 2014-04-01 ENCOUNTER — Inpatient Hospital Stay (HOSPITAL_COMMUNITY)
Admission: EM | Admit: 2014-04-01 | Discharge: 2014-04-02 | DRG: 392 | Disposition: A | Discharge status: Home / Self Care | Payer: Medicaid Other | Attending: Internal Medicine | Admitting: Internal Medicine

## 2014-04-01 DIAGNOSIS — Z91040 Latex allergy status: Secondary | ICD-10-CM

## 2014-04-01 DIAGNOSIS — T50995A Adverse effect of other drugs, medicaments and biological substances, initial encounter: Secondary | ICD-10-CM | POA: Diagnosis present

## 2014-04-01 DIAGNOSIS — Z833 Family history of diabetes mellitus: Secondary | ICD-10-CM

## 2014-04-01 DIAGNOSIS — R7989 Other specified abnormal findings of blood chemistry: Secondary | ICD-10-CM

## 2014-04-01 DIAGNOSIS — E2740 Unspecified adrenocortical insufficiency: Secondary | ICD-10-CM

## 2014-04-01 DIAGNOSIS — R112 Nausea with vomiting, unspecified: Principal | ICD-10-CM | POA: Diagnosis present

## 2014-04-01 DIAGNOSIS — R197 Diarrhea, unspecified: Secondary | ICD-10-CM

## 2014-04-01 DIAGNOSIS — K861 Other chronic pancreatitis: Secondary | ICD-10-CM | POA: Diagnosis present

## 2014-04-01 DIAGNOSIS — B370 Candidal stomatitis: Secondary | ICD-10-CM

## 2014-04-01 DIAGNOSIS — K863 Pseudocyst of pancreas: Secondary | ICD-10-CM | POA: Diagnosis present

## 2014-04-01 DIAGNOSIS — Z880 Allergy status to penicillin: Secondary | ICD-10-CM

## 2014-04-01 DIAGNOSIS — R1013 Epigastric pain: Secondary | ICD-10-CM

## 2014-04-01 DIAGNOSIS — D638 Anemia in other chronic diseases classified elsewhere: Secondary | ICD-10-CM | POA: Diagnosis present

## 2014-04-01 DIAGNOSIS — R502 Drug induced fever: Secondary | ICD-10-CM

## 2014-04-01 DIAGNOSIS — R9389 Abnormal findings on diagnostic imaging of other specified body structures: Secondary | ICD-10-CM

## 2014-04-01 DIAGNOSIS — Z885 Allergy status to narcotic agent status: Secondary | ICD-10-CM | POA: Diagnosis not present

## 2014-04-01 DIAGNOSIS — Z881 Allergy status to other antibiotic agents status: Secondary | ICD-10-CM

## 2014-04-01 DIAGNOSIS — F4323 Adjustment disorder with mixed anxiety and depressed mood: Secondary | ICD-10-CM | POA: Diagnosis present

## 2014-04-01 DIAGNOSIS — K219 Gastro-esophageal reflux disease without esophagitis: Secondary | ICD-10-CM | POA: Diagnosis present

## 2014-04-01 DIAGNOSIS — Z6841 Body Mass Index (BMI) 40.0 and over, adult: Secondary | ICD-10-CM

## 2014-04-01 DIAGNOSIS — R1110 Vomiting, unspecified: Secondary | ICD-10-CM

## 2014-04-01 DIAGNOSIS — G35 Multiple sclerosis: Secondary | ICD-10-CM | POA: Diagnosis present

## 2014-04-01 DIAGNOSIS — K8500 Idiopathic acute pancreatitis without necrosis or infection: Secondary | ICD-10-CM

## 2014-04-01 DIAGNOSIS — R509 Fever, unspecified: Secondary | ICD-10-CM | POA: Diagnosis present

## 2014-04-01 DIAGNOSIS — E876 Hypokalemia: Secondary | ICD-10-CM | POA: Diagnosis present

## 2014-04-01 DIAGNOSIS — E668 Other obesity: Secondary | ICD-10-CM

## 2014-04-01 DIAGNOSIS — E43 Unspecified severe protein-calorie malnutrition: Secondary | ICD-10-CM

## 2014-04-01 DIAGNOSIS — K862 Cyst of pancreas: Secondary | ICD-10-CM | POA: Diagnosis present

## 2014-04-01 DIAGNOSIS — Z87891 Personal history of nicotine dependence: Secondary | ICD-10-CM

## 2014-04-01 DIAGNOSIS — K859 Acute pancreatitis without necrosis or infection, unspecified: Secondary | ICD-10-CM

## 2014-04-01 DIAGNOSIS — R1115 Cyclical vomiting syndrome unrelated to migraine: Secondary | ICD-10-CM

## 2014-04-01 LAB — LIPASE, BLOOD: LIPASE: 21 U/L (ref 11–59)

## 2014-04-01 LAB — URINALYSIS, ROUTINE W REFLEX MICROSCOPIC
Bilirubin Urine: NEGATIVE
Glucose, UA: NEGATIVE mg/dL
Hgb urine dipstick: NEGATIVE
Ketones, ur: 15 mg/dL — AB
Leukocytes, UA: NEGATIVE
Nitrite: NEGATIVE
Protein, ur: NEGATIVE mg/dL
SPECIFIC GRAVITY, URINE: 1.018 (ref 1.005–1.030)
UROBILINOGEN UA: 1 mg/dL (ref 0.0–1.0)
pH: 5 (ref 5.0–8.0)

## 2014-04-01 LAB — CBC WITH DIFFERENTIAL/PLATELET
Basophils Absolute: 0 10*3/uL (ref 0.0–0.1)
Basophils Relative: 0 % (ref 0–1)
EOS PCT: 1 % (ref 0–5)
Eosinophils Absolute: 0.2 10*3/uL (ref 0.0–0.7)
HEMATOCRIT: 35 % — AB (ref 36.0–46.0)
Hemoglobin: 11.6 g/dL — ABNORMAL LOW (ref 12.0–15.0)
LYMPHS ABS: 0.5 10*3/uL — AB (ref 0.7–4.0)
Lymphocytes Relative: 3 % — ABNORMAL LOW (ref 12–46)
MCH: 27.7 pg (ref 26.0–34.0)
MCHC: 33.1 g/dL (ref 30.0–36.0)
MCV: 83.5 fL (ref 78.0–100.0)
MONO ABS: 0.4 10*3/uL (ref 0.1–1.0)
MONOS PCT: 2 % — AB (ref 3–12)
NEUTROS ABS: 16.8 10*3/uL — AB (ref 1.7–7.7)
Neutrophils Relative %: 94 % — ABNORMAL HIGH (ref 43–77)
Platelets: 227 10*3/uL (ref 150–400)
RBC: 4.19 MIL/uL (ref 3.87–5.11)
RDW: 14 % (ref 11.5–15.5)
WBC Morphology: INCREASED
WBC: 17.9 10*3/uL — AB (ref 4.0–10.5)

## 2014-04-01 LAB — COMPREHENSIVE METABOLIC PANEL
ALT: 42 U/L — ABNORMAL HIGH (ref 0–35)
ANION GAP: 18 — AB (ref 5–15)
AST: 47 U/L — ABNORMAL HIGH (ref 0–37)
Albumin: 3.5 g/dL (ref 3.5–5.2)
Alkaline Phosphatase: 112 U/L (ref 39–117)
BILIRUBIN TOTAL: 0.3 mg/dL (ref 0.3–1.2)
BUN: 14 mg/dL (ref 6–23)
CHLORIDE: 104 meq/L (ref 96–112)
CO2: 20 meq/L (ref 19–32)
CREATININE: 0.6 mg/dL (ref 0.50–1.10)
Calcium: 8.9 mg/dL (ref 8.4–10.5)
GLUCOSE: 131 mg/dL — AB (ref 70–99)
Potassium: 3.4 mEq/L — ABNORMAL LOW (ref 3.7–5.3)
Sodium: 142 mEq/L (ref 137–147)
Total Protein: 7.6 g/dL (ref 6.0–8.3)

## 2014-04-01 LAB — PREGNANCY, URINE: Preg Test, Ur: NEGATIVE

## 2014-04-01 LAB — I-STAT CG4 LACTIC ACID, ED
LACTIC ACID, VENOUS: 3.45 mmol/L — AB (ref 0.5–2.2)
LACTIC ACID, VENOUS: 1.91 mmol/L (ref 0.5–2.2)

## 2014-04-01 MED ORDER — HYDROMORPHONE HCL PF 1 MG/ML IJ SOLN
1.0000 mg | Freq: Once | INTRAMUSCULAR | Status: AC
  Administered 2014-04-01: 1 mg via INTRAVENOUS
  Filled 2014-04-01: qty 1

## 2014-04-01 MED ORDER — LORAZEPAM 2 MG/ML IJ SOLN
1.0000 mg | Freq: Once | INTRAMUSCULAR | Status: AC
  Administered 2014-04-01: 1 mg via INTRAVENOUS
  Filled 2014-04-01: qty 1

## 2014-04-01 MED ORDER — ONDANSETRON HCL 4 MG/2ML IJ SOLN
4.0000 mg | Freq: Three times a day (TID) | INTRAMUSCULAR | Status: AC | PRN
  Administered 2014-04-01 – 2014-04-02 (×2): 4 mg via INTRAVENOUS
  Filled 2014-04-01 (×2): qty 2

## 2014-04-01 MED ORDER — ONDANSETRON HCL 4 MG/2ML IJ SOLN
4.0000 mg | Freq: Once | INTRAMUSCULAR | Status: DC
  Filled 2014-04-01: qty 2

## 2014-04-01 MED ORDER — SODIUM CHLORIDE 0.9 % IV SOLN
INTRAVENOUS | Status: DC
  Administered 2014-04-02: 03:00:00 via INTRAVENOUS

## 2014-04-01 MED ORDER — ACETAMINOPHEN 325 MG PO TABS: 650.0000 mg | ORAL_TABLET | Freq: Four times a day (QID) | ORAL | Status: DC | PRN

## 2014-04-01 MED ORDER — PANCRELIPASE (LIP-PROT-AMYL) 12000-38000 UNITS PO CPEP
24000.0000 [IU] | ORAL_CAPSULE | Freq: Three times a day (TID) | ORAL | Status: DC
  Administered 2014-04-02: 24000 [IU] via ORAL
  Filled 2014-04-01 (×5): qty 2

## 2014-04-01 MED ORDER — HYDROXYZINE HCL 25 MG PO TABS: 50.0000 mg | ORAL_TABLET | Freq: Four times a day (QID) | ORAL | Status: DC | PRN

## 2014-04-01 MED ORDER — NAPHAZOLINE HCL 0.1 % OP SOLN
1.0000 [drp] | Freq: Three times a day (TID) | OPHTHALMIC | Status: DC
  Filled 2014-04-01: qty 15

## 2014-04-01 MED ORDER — HYDROMORPHONE HCL PF 1 MG/ML IJ SOLN
1.0000 mg | INTRAMUSCULAR | Status: AC | PRN
  Administered 2014-04-01 – 2014-04-02 (×3): 1 mg via INTRAVENOUS
  Filled 2014-04-01 (×3): qty 1

## 2014-04-01 MED ORDER — CYCLOBENZAPRINE HCL 5 MG PO TABS: 5.0000 mg | ORAL_TABLET | Freq: Three times a day (TID) | ORAL | Status: DC | PRN

## 2014-04-01 MED ORDER — IOHEXOL 300 MG/ML  SOLN
25.0000 mL | Freq: Once | INTRAMUSCULAR | Status: AC | PRN
  Administered 2014-04-01: 25 mL via ORAL

## 2014-04-01 MED ORDER — PNEUMOCOCCAL VAC POLYVALENT 25 MCG/0.5ML IJ INJ
0.5000 mL | INJECTION | INTRAMUSCULAR | Status: AC
  Administered 2014-04-02: 0.5 mL via INTRAMUSCULAR
  Filled 2014-04-01 (×2): qty 0.5

## 2014-04-01 MED ORDER — ACETAMINOPHEN 650 MG RE SUPP
650.0000 mg | Freq: Four times a day (QID) | RECTAL | Status: DC | PRN
Start: 2014-04-01 — End: 2014-04-02

## 2014-04-01 MED ORDER — SODIUM CHLORIDE 0.9 % IV BOLUS (SEPSIS)
1000.0000 mL | Freq: Once | INTRAVENOUS | Status: AC
  Administered 2014-04-01: 1000 mL via INTRAVENOUS

## 2014-04-01 MED ORDER — HYDROMORPHONE HCL PF 1 MG/ML IJ SOLN: 1.0000 mg | INTRAMUSCULAR | Status: DC | PRN

## 2014-04-01 MED ORDER — IOHEXOL 300 MG/ML  SOLN
100.0000 mL | Freq: Once | INTRAMUSCULAR | Status: AC | PRN
  Administered 2014-04-01: 100 mL via INTRAVENOUS

## 2014-04-01 MED ORDER — ONDANSETRON HCL 4 MG/2ML IJ SOLN
4.0000 mg | Freq: Once | INTRAMUSCULAR | Status: AC
  Administered 2014-04-01: 4 mg via INTRAVENOUS
  Filled 2014-04-01: qty 2

## 2014-04-01 MED ORDER — NAPHAZOLINE HCL 0.1 % OP SOLN
1.0000 [drp] | Freq: Three times a day (TID) | OPHTHALMIC | Status: DC
  Administered 2014-04-01 – 2014-04-02 (×3): 1 [drp] via OPHTHALMIC

## 2014-04-01 MED ORDER — VITAMIN D3 25 MCG (1000 UNIT) PO TABS
5000.0000 [IU] | ORAL_TABLET | Freq: Every day | ORAL | Status: DC
  Filled 2014-04-01 (×2): qty 5

## 2014-04-01 MED ORDER — ONDANSETRON HCL 4 MG/2ML IJ SOLN
4.0000 mg | Freq: Once | INTRAMUSCULAR | Status: AC
  Administered 2014-04-01: 4 mg via INTRAVENOUS

## 2014-04-01 MED ORDER — ENOXAPARIN SODIUM 40 MG/0.4ML ~~LOC~~ SOLN
40.0000 mg | SUBCUTANEOUS | Status: DC
  Administered 2014-04-01: 40 mg via SUBCUTANEOUS
  Filled 2014-04-01 (×2): qty 0.4

## 2014-04-01 MED ORDER — ONDANSETRON HCL 4 MG/2ML IJ SOLN: 4.0000 mg | Freq: Four times a day (QID) | INTRAMUSCULAR | Status: DC | PRN

## 2014-04-01 NOTE — Progress Notes (Signed)
Taught son and patient how to administer and draw up insulin. Insulin starter kit given to patient. Patients son is the interpretor for the patient and he did not have any questions , nor did the patient.

## 2014-04-01 NOTE — ED Notes (Signed)
Ambulated to the BR using a walker.

## 2014-04-01 NOTE — ED Notes (Signed)
Pt IV infiltrated. Removed will attempt with ultrasound.

## 2014-04-01 NOTE — ED Notes (Signed)
Patient presents from home via EMS with c/o N/V/D.  Had 1 bout of nausea and diahhrea PTA.  Has been hyperventalating.

## 2014-04-01 NOTE — ED Provider Notes (Signed)
Medical screening examination/treatment/procedure(s) were performed by non-physician practitioner and as supervising physician I was immediately available for consultation/collaboration.   EKG Interpretation None       Mussa Groesbeck M Anissa Abbs, MD 04/01/14 2126 

## 2014-04-01 NOTE — ED Notes (Signed)
Results of lactic acid given to Dr. Norlene Campbell

## 2014-04-01 NOTE — ED Notes (Signed)
Patient transported to CT 

## 2014-04-01 NOTE — ED Provider Notes (Signed)
Medical screening examination/treatment/procedure(s) were performed by non-physician practitioner and as supervising physician I was immediately available for consultation/collaboration.   EKG Interpretation None       Olivia Mackielga M Laynie Espy, MD 04/01/14 2126

## 2014-04-01 NOTE — ED Provider Notes (Signed)
CSN: [id number]     Arrival date & time 04/01/14  0403 History   First MD Initiated Contact with Patient 04/01/14 0451     Chief Complaint  Patient presents with  . Nausea  . Emesis  . Diarrhea     (Consider location/radiation/quality/duration/timing/severity/associated sxs/prior Treatment) HPI Comments: Patient just released form hospital for pancreatitis  Now with pain in LUQ, nausea and vomiting  Also has R sided weakness consistent with her MS flare  She injected her Copaxone into L abdomen when she overwhelming feeling of weakness and nausea   Patient is a 38 y.o. female presenting with vomiting and diarrhea. The history is provided by the patient.  Emesis Severity:  Moderate Timing:  Intermittent Quality:  Bilious material Progression:  Worsening Chronicity:  Recurrent Worsened by:  Nothing tried Associated symptoms: abdominal pain, chills, diarrhea and fever   Associated symptoms: no headaches   Abdominal pain:    Location:  Epigastric and LUQ   Quality:  Aching   Severity:  Severe   Onset quality:  Gradual   Timing:  Constant   Progression:  Worsening   Chronicity:  Recurrent Diarrhea Associated symptoms: abdominal pain, chills, fever and vomiting   Associated symptoms: no headaches     Past Medical History  Diagnosis Date  . Hernia 03-24-13    ventral hernia remains   . GERD (gastroesophageal reflux disease)   . Pancreatitis 03-24-13    2002, 2'2013, 03-14-13  . Pancreatic cyst 2002 onset  . Anxiety   . Depression     "recent breakup with partner of 15 yrs"  . Adrenal insufficiency 04/23/2013    ??  . MS (multiple sclerosis)    Past Surgical History  Procedure Laterality Date  . Abdominal surgery  ~ 2007    some sort of pancreatic cyst drainage.   . Cholecystectomy      ~ age 50-laparoscopic  . Adenoidectomy    . 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 Eus N/A 04/14/2013    Procedure: UPPER ENDOSCOPIC  ULTRASOUND (EUS) LINEAR;  Surgeon: Rachael Fee, MD;  Location: [facility];  Service: Endoscopy;  Laterality: N/A;   Family History  Problem Relation Age of Onset  . Lupus Neg Hx   . Sarcoidosis Neg Hx   . Pancreatitis Neg Hx   . Ataxia Neg Hx   . Chorea Neg Hx   . Dementia Neg Hx   . Mental retardation Neg Hx   . Migraines Neg Hx   . Multiple sclerosis Neg Hx   . Neurofibromatosis Neg Hx   . Neuropathy Neg Hx   . Parkinsonism Neg Hx   . Seizures Neg Hx   . Stroke Neg Hx   . Colitis Maternal Aunt   . Diabetes Mother   . Diabetes Father   . Diabetes      many family members   History  Substance Use Topics  . Smoking status: Former Smoker -- 0.50 packs/day for 20 years    Types: Cigarettes    Quit date: 05/26/2013  . Smokeless tobacco: Never Used  . Alcohol Use: No   OB History   Grav Para Term Preterm Abortions TAB SAB Ect Mult Living                 Review of Systems  Unable to perform ROS Constitutional: Positive for fever and chills.  Respiratory: Negative for shortness of breath.   Gastrointestinal:  Positive for nausea, vomiting, abdominal pain and diarrhea. Negative for abdominal distention.  Neurological: Negative for dizziness and headaches.  All other systems reviewed and are negative.     Allergies  Amoxicillin; Penicillins; Latex; and Morphine and related  Home Medications   Prior to Admission medications   Medication Sig Start Date End Date Taking? Authorizing Provider  b complex vitamins tablet Take 1 tablet by mouth daily.   Yes Historical Provider, MD  cholecalciferol (VITAMIN D) 1000 UNITS tablet Take 5,000 Units by mouth daily.   Yes Historical Provider, MD  cyclobenzaprine (FLEXERIL) 5 MG tablet Take 5 mg by mouth 3 (three) times daily as needed for muscle spasms.   Yes Historical Provider, MD  Glatiramer Acetate (COPAXONE) 40 MG/ML SOSY Inject 1 Syringe into the skin 3 (three) times a week. 09/12/13  Yes Adam Gus Rankinobert Jaffe, DO   HYDROmorphone (DILAUDID) 4 MG tablet Take 4 mg by mouth every 4 (four) hours as needed for severe pain.   Yes Historical Provider, MD  hydrOXYzine (ATARAX/VISTARIL) 50 MG tablet Take 50 mg by mouth every 6 (six) hours as needed for anxiety.   Yes Historical Provider, MD  lipase/protease/amylase (CREON-12/PANCREASE) 12000 UNITS CPEP capsule Take 2 capsules by mouth 3 (three) times daily with meals. 07/31/13  Yes Catarina Hartshornavid Tat, MD  naphazoline (NAPHCON) 0.1 % ophthalmic solution Place 1 drop into both eyes 4 (four) times daily -  with meals and at bedtime. 02/23/14  Yes Evlyn KannerPamela S Love, PA-C  promethazine (PHENERGAN) 25 MG tablet Take 1 tablet (25 mg total) by mouth every 6 (six) hours as needed for nausea or vomiting. 03/12/14  Yes Dorothea OgleIskra M Myers, MD  Triamcinolone Acetonide (TRIAMCINOLONE 0.1 % CREAM : EUCERIN) CREA Apply 1 application topically 3 (three) times daily. 02/23/14  Yes Pamela S Love, PA-C   BP 104/57  Pulse 78  Temp(Src) 98 F (36.7 C) (Oral)  Resp 16  Ht 5\' 4"  (1.626 m)  Wt 260 lb (117.935 kg)  BMI 44.61 kg/m2  SpO2 97% Physical Exam  Nursing note and vitals reviewed. Constitutional: She appears well-developed and well-nourished.  HENT:  Head: Normocephalic.  Eyes: Pupils are equal, round, and reactive to light.  Neck: Normal range of motion.  Cardiovascular: Regular rhythm.  Tachycardia present.   Pulmonary/Chest: No respiratory distress. She has no wheezes.  Abdominal: Soft. She exhibits no distension. There is no tenderness.  Musculoskeletal: Normal range of motion.  Neurological: She is alert.  Skin: Skin is warm. She is diaphoretic. No erythema.    ED Course  Procedures (including critical care time) Labs Review Labs Reviewed  CBC WITH DIFFERENTIAL - Abnormal; Notable for the following:    WBC 17.9 (*)    Hemoglobin 11.6 (*)    HCT 35.0 (*)    Neutrophils Relative % 94 (*)    Lymphocytes Relative 3 (*)    Monocytes Relative 2 (*)    Neutro Abs 16.8 (*)    Lymphs  Abs 0.5 (*)    All other components within normal limits  COMPREHENSIVE METABOLIC PANEL - Abnormal; Notable for the following:    Potassium 3.4 (*)    Glucose, Bld 131 (*)    AST 47 (*)    ALT 42 (*)    Anion gap 18 (*)    All other components within normal limits  URINALYSIS, ROUTINE W REFLEX MICROSCOPIC - Abnormal; Notable for the following:    APPearance HAZY (*)    Ketones, ur 15 (*)    All other  components within normal limits  I-STAT CG4 LACTIC ACID, ED - Abnormal; Notable for the following:    Lactic Acid, Venous 3.45 (*)    All other components within normal limits  CULTURE, BLOOD (ROUTINE X 2)  CULTURE, BLOOD (ROUTINE X 2)  URINE CULTURE  LIPASE, BLOOD  PREGNANCY, URINE  CBC  COMPREHENSIVE METABOLIC PANEL  LIPASE, BLOOD  I-STAT CG4 LACTIC ACID, ED    Imaging Review Ct Abdomen Pelvis W Contrast  04/01/2014   CLINICAL DATA:  Mid abdominal pain.  History of pancreatitis  EXAM: CT ABDOMEN AND PELVIS WITH CONTRAST  TECHNIQUE: Multidetector CT imaging of the abdomen and pelvis was performed using the standard protocol following bolus administration of intravenous contrast.  CONTRAST:  OMNIPAQUE IOHEXOL 300 MG/ML  SOLN  COMPARISON:  CT 07/30/2013, 02/23/2013  FINDINGS: Lung bases are clear.  No pericardial fluid.  No focal hepatic lesion. Patient status post cholecystectomy. No intrahepatic biliary duct dilatation. The common bile duct is normal caliber. The pancreatic head and pancreatic duct are normal. There is a multilobular cystic complex within the tail the pancreas measuring 3.2 x 4.6 cm. This is decreased in size compared to 4.7 x 5.3 cm on most recent CT. This is seen this cystic lesion is present on remote scan from CT 03/15/2013 and MR report of 2002.  The spleen, right adrenal gland, and kidneys are normal. There is a nodule of the left adrenal gland measuring 16 mm is unchanged.  The stomach, small bowel, cecum, and colon are normal. Abdominal aorta is normal  caliber. No retroperitoneal periportal lymphadenopathy.  No free fluid the pelvis. The uterus, ovaries, and bladder normal. No pelvic lymphadenopathy.  No aggressive osseous lesion.  IMPRESSION: 1. Interval contraction of the cystic complex within the tail of the pancreas. 2. No biliary ductal dilatation or pancreatic duct dilatation. 3. Patient status post cholecystectomy. 4. Stable small left adrenal nodule.   Electronically Signed   By: Genevive Bi M.D.   On: 04/01/2014 12:37     EKG Interpretation None      MDM   Final diagnoses:  Intractable vomiting with nausea, vomiting of unspecified type  Epigastric pain         Arman Filter, NP 04/01/14 2005

## 2014-04-01 NOTE — ED Notes (Signed)
Vomited approximately 

## 2014-04-01 NOTE — ED Notes (Signed)
Attempted Report 

## 2014-04-01 NOTE — ED Provider Notes (Signed)
CSN: [id number]     Arrival date & time 04/01/14  0403 History   First MD Initiated Contact with Patient 04/01/14 0451     Chief Complaint  Patient presents with  . Nausea  . Emesis  . Diarrhea   Patient is a 38 y.o. female presenting with vomiting and diarrhea.  Emesis Associated symptoms: diarrhea   Diarrhea Associated symptoms: vomiting     Patient is a 38 y.o. Female with a history of ideopathic pancreatitis who presents to the ED with LUQ pain, nausea, and vomiting who was signed out to me at shift change from Sharen HonesGail Schultz.  Patient was just recently admitted to the hospital on 7/25 and discharged on 7/26 for idiopathic pancreatitis.  Patient states that after her injection of copaxone last night she developed some shortness of breath, diaphoresis, nausea, and then vomited x 2.  Patient states that she has never had a reaction to her medication like this before.  Patient states that she also has some epigastric pain with radiates to the right and left upper quadrants.  Patient states that she has had pancreatic cysts drained, a roux-en-y procedure, and a cholecystectomy in the past.  Patient states that she tried phenergan and zofran at home with little relief. She denies fevers, constipation, chest pain, melena, hematochezia, hematuria, dysuria, frequency and urgency.    Past Medical History  Diagnosis Date  . Hernia 03-24-13    ventral hernia remains   . GERD (gastroesophageal reflux disease)   . Pancreatitis 03-24-13    2002, 2'2013, 03-14-13  . Pancreatic cyst 2002 onset  . Anxiety   . Depression     "recent breakup with partner of 15 yrs"  . Adrenal insufficiency 04/23/2013    ??  . MS (multiple sclerosis)    Past Surgical History  Procedure Laterality Date  . Abdominal surgery  ~ 2007    some sort of pancreatic cyst drainage.   . Cholecystectomy      ~ age 16-laparoscopic  . Adenoidectomy    . Roux-en-y procedure      stent to pancreatic cyst that became infected within 36  hours  . Eus N/A 04/14/2013    Procedure: UPPER ENDOSCOPIC ULTRASOUND (EUS) LINEAR;  Surgeon: Rachael Feeaniel P Jacobs, MD;  Location: [facility];  Service: Endoscopy;  Laterality: N/A;   Family History  Problem Relation Age of Onset  . Lupus Neg Hx   . Sarcoidosis Neg Hx   . Pancreatitis Neg Hx   . Ataxia Neg Hx   . Chorea Neg Hx   . Dementia Neg Hx   . Mental retardation Neg Hx   . Migraines Neg Hx   . Multiple sclerosis Neg Hx   . Neurofibromatosis Neg Hx   . Neuropathy Neg Hx   . Parkinsonism Neg Hx   . Seizures Neg Hx   . Stroke Neg Hx   . Colitis Maternal Aunt   . Diabetes Mother   . Diabetes Father   . Diabetes      many family members   History  Substance Use Topics  . Smoking status: Former Smoker -- 0.50 packs/day for 20 years    Types: Cigarettes    Quit date: 05/26/2013  . Smokeless tobacco: Never Used  . Alcohol Use: No   OB History   Grav Para Term Preterm Abortions TAB SAB Ect Mult Living                 Review of Systems  Gastrointestinal: Positive for  vomiting and diarrhea.   See HPI   Allergies  Amoxicillin; Penicillins; Latex; and Morphine and related  Home Medications   Prior to Admission medications   Medication Sig Start Date End Date Taking? Authorizing Provider  b complex vitamins tablet Take 1 tablet by mouth daily.   Yes Historical Provider, MD  cholecalciferol (VITAMIN D) 1000 UNITS tablet Take 5,000 Units by mouth daily.   Yes Historical Provider, MD  cyclobenzaprine (FLEXERIL) 5 MG tablet Take 5 mg by mouth 3 (three) times daily as needed for muscle spasms.   Yes Historical Provider, MD  Glatiramer Acetate (COPAXONE) 40 MG/ML SOSY Inject 1 Syringe into the skin 3 (three) times a week. 09/12/13  Yes Adam Gus Rankin, DO  HYDROmorphone (DILAUDID) 4 MG tablet Take 4 mg by mouth every 4 (four) hours as needed for severe pain.   Yes Historical Provider, MD  hydrOXYzine (ATARAX/VISTARIL) 50 MG tablet Take 50 mg by mouth every 6 (six) hours as  needed for anxiety.   Yes Historical Provider, MD  lipase/protease/amylase (CREON-12/PANCREASE) 12000 UNITS CPEP capsule Take 2 capsules by mouth 3 (three) times daily with meals. 07/31/13  Yes Catarina Hartshorn, MD  naphazoline (NAPHCON) 0.1 % ophthalmic solution Place 1 drop into both eyes 4 (four) times daily -  with meals and at bedtime. 02/23/14  Yes Evlyn Kanner Love, PA-C  promethazine (PHENERGAN) 25 MG tablet Take 1 tablet (25 mg total) by mouth every 6 (six) hours as needed for nausea or vomiting. 03/12/14  Yes Dorothea Ogle, MD  Triamcinolone Acetonide (TRIAMCINOLONE 0.1 % CREAM : EUCERIN) CREA Apply 1 application topically 3 (three) times daily. 02/23/14  Yes Evlyn Kanner Love, PA-C   BP 105/74  Pulse 94  Temp(Src) 99.5 F (37.5 C) (Oral)  Resp 21  Ht 5\' 5"  (1.651 m)  Wt 260 lb (117.935 kg)  BMI 43.27 kg/m2  SpO2 91% Physical Exam  Nursing note and vitals reviewed. Constitutional: She is oriented to person, place, and time. She appears well-developed and well-nourished. No distress.  HENT:  Head: Normocephalic and atraumatic.  Mouth/Throat: Oropharynx is clear and moist. No oropharyngeal exudate.  Eyes: Conjunctivae are normal. No scleral icterus.  Neck: Normal range of motion. Neck supple. No JVD present. No thyromegaly present.  Cardiovascular: Normal rate, regular rhythm, normal heart sounds and intact distal pulses.  Exam reveals no gallop and no friction rub.   No murmur heard. Pulmonary/Chest: Effort normal and breath sounds normal. No respiratory distress. She has no wheezes. She has no rales. She exhibits no tenderness.  Abdominal: Soft. Bowel sounds are normal. She exhibits no distension and no mass. There is tenderness in the right upper quadrant, epigastric area and left upper quadrant. There is no rebound and no guarding.  Multiple well healed abdominal surgical scars  Lymphadenopathy:    She has no cervical adenopathy.  Neurological: She is alert and oriented to person, place, and  time. No cranial nerve deficit. Coordination normal.  Mild right sided weakness  Skin: Skin is warm and dry. She is not diaphoretic.  Psychiatric: She has a normal mood and affect. Her behavior is normal. Judgment and thought content normal.    ED Course  Procedures (including critical care time) Labs Review Labs Reviewed  CBC WITH DIFFERENTIAL - Abnormal; Notable for the following:    WBC 17.9 (*)    Hemoglobin 11.6 (*)    HCT 35.0 (*)    Neutrophils Relative % 94 (*)    Lymphocytes Relative 3 (*)  Monocytes Relative 2 (*)    Neutro Abs 16.8 (*)    Lymphs Abs 0.5 (*)    All other components within normal limits  COMPREHENSIVE METABOLIC PANEL - Abnormal; Notable for the following:    Potassium 3.4 (*)    Glucose, Bld 131 (*)    AST 47 (*)    ALT 42 (*)    Anion gap 18 (*)    All other components within normal limits  URINALYSIS, ROUTINE W REFLEX MICROSCOPIC - Abnormal; Notable for the following:    APPearance HAZY (*)    Ketones, ur 15 (*)    All other components within normal limits  I-STAT CG4 LACTIC ACID, ED - Abnormal; Notable for the following:    Lactic Acid, Venous 3.45 (*)    All other components within normal limits  CULTURE, BLOOD (ROUTINE X 2)  CULTURE, BLOOD (ROUTINE X 2)  URINE CULTURE  LIPASE, BLOOD  PREGNANCY, URINE  I-STAT CG4 LACTIC ACID, ED    Imaging Review Ct Abdomen Pelvis W Contrast  04/01/2014   CLINICAL DATA:  Mid abdominal pain.  History of pancreatitis  EXAM: CT ABDOMEN AND PELVIS WITH CONTRAST  TECHNIQUE: Multidetector CT imaging of the abdomen and pelvis was performed using the standard protocol following bolus administration of intravenous contrast.  CONTRAST:  OMNIPAQUE IOHEXOL 300 MG/ML  SOLN  COMPARISON:  CT 07/30/2013, 02/23/2013  FINDINGS: Lung bases are clear.  No pericardial fluid.  No focal hepatic lesion. Patient status post cholecystectomy. No intrahepatic biliary duct dilatation. The common bile duct is normal caliber. The  pancreatic head and pancreatic duct are normal. There is a multilobular cystic complex within the tail the pancreas measuring 3.2 x 4.6 cm. This is decreased in size compared to 4.7 x 5.3 cm on most recent CT. This is seen this cystic lesion is present on remote scan from CT 03/15/2013 and MR report of 2002.  The spleen, right adrenal gland, and kidneys are normal. There is a nodule of the left adrenal gland measuring 16 mm is unchanged.  The stomach, small bowel, cecum, and colon are normal. Abdominal aorta is normal caliber. No retroperitoneal periportal lymphadenopathy.  No free fluid the pelvis. The uterus, ovaries, and bladder normal. No pelvic lymphadenopathy.  No aggressive osseous lesion.  IMPRESSION: 1. Interval contraction of the cystic complex within the tail of the pancreas. 2. No biliary ductal dilatation or pancreatic duct dilatation. 3. Patient status post cholecystectomy. 4. Stable small left adrenal nodule.   Electronically Signed   By: Genevive Bi M.D.   On: 04/01/2014 12:37     EKG Interpretation None      MDM   Final diagnoses:  Intractable vomiting with nausea, vomiting of unspecified type  Epigastric pain   Patient is a 38 y.o. Female who presents to the ED with fever, nausea, vomiting, and epigastric pain.  Patient has a history of ideopathic pancreatitis with pseudocysts.  Patient has an elevated lactic acid here in the ED at 3.45 upon arrival.  CBC shows leukocytosis with mild anemia which appears to be stable at baseline.  CMP shows mild bump in AST and ALT which is new, and some hypokalemia with an anion gap of 18.  UA shows no signs of infection at this time.  Lipase is WNL.  Blood culture and urine cultures are currently pending.  Given no obvious source for leukocytosis and fever at this time will perform a CT scan of the abdomen and pelvis with contrast.  CT  scan shows contraction of a pancreatic cyst from prior scans but no other acute abnormalities at this time.   Patient is requiring dilaudid on a regular basis.  I believe that she will likely need to come into the hospital at this time for pain control and fever of a source unspecified.  Lactic acid has cleared with fluids here.  I have spoken with Dr. Jomarie Longs who will admit the patient to med-surg at this time with Team 10.    Eben Burow, PA-C 04/01/14 1434

## 2014-04-01 NOTE — H&P (Addendum)
[facility] History and Physical  Mary [name] AST:[id number] DOB: 08-Nov-1975 DOA: 04/01/2014  Referring physician: EDP PCP: No PCP Per Patient  Neuro: [id number]  Chief Complaint: Abd pain, N/V, fever  HPI: Mary [name] is a 38 y.o. female with PMH of chronic pancreatitis, pancreatic cyst, Relapsing-remitting type of Multiple sclerosis, GERD, Anxiety, Depression presents to the ER with the above complaints. She has had extensive previous workup for her abd pain/pancreatitis. She takes PO Dilaudid intermittently for her Abd pain or MS related pain. She report nausea starting Thursday and then started having abd pain, had been taking her Po dilaudid without much benefit. Then yesterday evening, she gave herself the shot of Copaxone and after this felt flushed, warm and felt nauseated and then developed a fever and came to the ER. In ER, initially febrile and tachycardic last pm, since improved but complaining of persistent nausea and abd pain and vomited x1, requiring multiple doses of IV dilaudid, TRH consulted. Labs unremarkable, lipase WNL and CT without acute findings and interval contraction of the cyst in the tail of the pancreas   Review of Systems: positives bolded Constitutional:  No weight loss, night sweats, Fevers, chills, fatigue.  HEENT:  No headaches, Difficulty swallowing,Tooth/dental problems,Sore throat,  No sneezing, itching, ear ache, nasal congestion, post nasal drip,  Cardio-vascular:  No chest pain, Orthopnea, PND, swelling in lower extremities, anasarca, dizziness, palpitations  GI:  No heartburn, indigestion, abdominal pain, nausea, vomiting, diarrhea, change in bowel habits, loss of appetite  Resp:  No shortness of breath with exertion or at rest. No excess mucus, no productive cough, No non-productive cough, No coughing up of blood.No change in color of mucus.No wheezing.No chest wall deformity  Skin:  no rash or lesions.  GU:  no dysuria, change  in color of urine, no urgency or frequency. No flank pain.  Musculoskeletal:  No joint pain or swelling. No decreased range of motion. No back pain.  Psych:  No change in mood or affect. No depression or anxiety. No memory loss.   Past Medical History  Diagnosis Date  . Hernia 03-24-13    ventral hernia remains   . GERD (gastroesophageal reflux disease)   . Pancreatitis 03-24-13    2002, 2'2013, 03-14-13  . Pancreatic cyst 2002 onset  . Anxiety   . Depression     "recent breakup with partner of 15 yrs"  . Adrenal insufficiency 04/23/2013    ??  . MS (multiple sclerosis)    Past Surgical History  Procedure Laterality Date  . Abdominal surgery  ~ 2007    some sort of pancreatic cyst drainage.   . Cholecystectomy      ~ age 26-laparoscopic  . Adenoidectomy    . 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 Eus N/A 04/14/2013    Procedure: UPPER ENDOSCOPIC ULTRASOUND (EUS) LINEAR;  Surgeon: Rachael Fee, MD;  Location: [facility];  Service: Endoscopy;  Laterality: N/A;   Social History:  reports that she quit smoking about 10 months ago. Her smoking use included Cigarettes. She has a 10 pack-year smoking history. She has never used smokeless tobacco. She reports that she does not drink alcohol or use illicit drugs.  Allergies  Allergen Reactions  . Amoxicillin Anaphylaxis  . Penicillins Anaphylaxis  . Latex Itching  . Morphine And Related Nausea And Vomiting    Family History  Problem Relation Age of Onset  . Lupus Neg  Hx   . Sarcoidosis Neg Hx   . Pancreatitis Neg Hx   . Ataxia Neg Hx   . Chorea Neg Hx   . Dementia Neg Hx   . Mental retardation Neg Hx   . Migraines Neg Hx   . Multiple sclerosis Neg Hx   . Neurofibromatosis Neg Hx   . Neuropathy Neg Hx   . Parkinsonism Neg Hx   . Seizures Neg Hx   . Stroke Neg Hx   . Colitis Maternal Aunt   . Diabetes Mother   . Diabetes Father   . Diabetes      many family members      Prior to Admission medications   Medication Sig Start Date End Date Taking? Authorizing Provider  b complex vitamins tablet Take 1 tablet by mouth daily.   Yes Historical Provider, MD  cholecalciferol (VITAMIN D) 1000 UNITS tablet Take 5,000 Units by mouth daily.   Yes Historical Provider, MD  cyclobenzaprine (FLEXERIL) 5 MG tablet Take 5 mg by mouth 3 (three) times daily as needed for muscle spasms.   Yes Historical Provider, MD  Glatiramer Acetate (COPAXONE) 40 MG/ML SOSY Inject 1 Syringe into the skin 3 (three) times a week. 09/12/13  Yes Adam Gus Rankin, DO  HYDROmorphone (DILAUDID) 4 MG tablet Take 4 mg by mouth every 4 (four) hours as needed for severe pain.   Yes Historical Provider, MD  hydrOXYzine (ATARAX/VISTARIL) 50 MG tablet Take 50 mg by mouth every 6 (six) hours as needed for anxiety.   Yes Historical Provider, MD  lipase/protease/amylase (CREON-12/PANCREASE) 12000 UNITS CPEP capsule Take 2 capsules by mouth 3 (three) times daily with meals. 07/31/13  Yes Catarina Hartshorn, MD  naphazoline (NAPHCON) 0.1 % ophthalmic solution Place 1 drop into both eyes 4 (four) times daily -  with meals and at bedtime. 02/23/14  Yes Evlyn Kanner Love, PA-C  promethazine (PHENERGAN) 25 MG tablet Take 1 tablet (25 mg total) by mouth every 6 (six) hours as needed for nausea or vomiting. 03/12/14  Yes Dorothea Ogle, MD  Triamcinolone Acetonide (TRIAMCINOLONE 0.1 % CREAM : EUCERIN) CREA Apply 1 application topically 3 (three) times daily. 02/23/14  Yes Jacquelynn Cree, PA-C   Physical Exam: Filed Vitals:   04/01/14 1100 04/01/14 1130 04/01/14 1230 04/01/14 1330  BP: 104/68 105/63 102/67 105/74  Pulse: 95 98 89 94  Temp:      TempSrc:      Resp: 12 25 18 21   Height:      Weight:      SpO2: 94% 95% 93% 91%    Wt Readings from Last 3 Encounters:  04/01/14 117.935 kg (260 lb)  02/15/14 118.348 kg (260 lb 14.6 oz)  02/11/14 106.686 kg (235 lb 3.2 oz)    General:  Obese female, appears calm and comfortable,  AAOx3 no distress Eyes: PERRL, normal lids, irises & conjunctiva ENT: grossly normal lips & tongue Neck: no LAD, masses or thyromegaly Cardiovascular: RRR, no m/r/g. No LE edema. Respiratory: CTA bilaterally, no w/r/r. Normal respiratory effort. Abdomen: soft, obese, NT, BS present Skin: no rash , small area of redness and warmth at site of injection Musculoskeletal: grossly normal tone BUE/BLE Psychiatric: grossly normal mood and affect, speech fluent and appropriate Neurologic: grossly non-focal          Labs on Admission:  Basic Metabolic Panel:  Recent Labs Lab 04/01/14 0620  NA 142  K 3.4*  CL 104  CO2 20  GLUCOSE 131*  BUN 14  CREATININE 0.60  CALCIUM 8.9   Liver Function Tests:  Recent Labs Lab 04/01/14 0620  AST 47*  ALT 42*  ALKPHOS 112  BILITOT 0.3  PROT 7.6  ALBUMIN 3.5    Recent Labs Lab 04/01/14 0620  LIPASE 21   No results found for this basename: AMMONIA,  in the last 168 hours CBC:  Recent Labs Lab 04/01/14 0620  WBC 17.9*  NEUTROABS 16.8*  HGB 11.6*  HCT 35.0*  MCV 83.5  PLT 227   Cardiac Enzymes: No results found for this basename: CKTOTAL, CKMB, CKMBINDEX, TROPONINI,  in the last 168 hours  BNP (last 3 results) No results found for this basename: PROBNP,  in the last 8760 hours CBG: No results found for this basename: GLUCAP,  in the last 168 hours  Radiological Exams on Admission: Ct Abdomen Pelvis W Contrast  04/01/2014   CLINICAL DATA:  Mid abdominal pain.  History of pancreatitis  EXAM: CT ABDOMEN AND PELVIS WITH CONTRAST  TECHNIQUE: Multidetector CT imaging of the abdomen and pelvis was performed using the standard protocol following bolus administration of intravenous contrast.  CONTRAST:  100mL OMNIPAQUE IOHEXOL 300 MG/ML  SOLN  COMPARISON:  CT 07/30/2013, 02/23/2013  FINDINGS: Lung bases are clear.  No pericardial fluid.  No focal hepatic lesion. Patient status post cholecystectomy. No intrahepatic biliary duct  dilatation. The common bile duct is normal caliber. The pancreatic head and pancreatic duct are normal. There is a multilobular cystic complex within the tail the pancreas measuring 3.2 x 4.6 cm. This is decreased in size compared to 4.7 x 5.3 cm on most recent CT. This is seen this cystic lesion is present on remote scan from CT 03/15/2013 and MR report of 2002.  The spleen, right adrenal gland, and kidneys are normal. There is a nodule of the left adrenal gland measuring 16 mm is unchanged.  The stomach, small bowel, cecum, and colon are normal. Abdominal aorta is normal caliber. No retroperitoneal periportal lymphadenopathy.  No free fluid the pelvis. The uterus, ovaries, and bladder normal. No pelvic lymphadenopathy.  No aggressive osseous lesion.  IMPRESSION: 1. Interval contraction of the cystic complex within the tail of the pancreas. 2. No biliary ductal dilatation or pancreatic duct dilatation. 3. Patient status post cholecystectomy. 4. Stable small left adrenal nodule.   Electronically Signed   By: Genevive BiStewart  Edmunds M.D.   On: 04/01/2014 12:37    EKG: NSR, no acute ST T wave changes  Assessment/Plan   Nausea/Vomiting and Abd pain - suspect chronic pancreatitis, had extensive prior workup -has a chronic pancreatic cyst, CT and US today without any acute findings -supportive care, clears, IVF, antiemetics -transition to PO narcotics as soon as possible -Extensive workup for her symptoms, [facility] GI [facility], no further workup recommended, during prior hospitalizations drug seeking behaviour was suspected as well.    Relapsing remitting multiple sclerosis -hold Copaxone till FU with Neurology    Fever -flushing, following Copaxone injection -suspect delayed hypersensitivity reaction -afebrile now, hydrate, supportive care -FU with Dr.Jaffe   Morbid obesity -life style modification   DVT proph: lovenox  Code Status: Full Code Family Communication: d/w step mother at  bedside Disposition Plan: home soon, possibly tomorrow Time spent:7950min  [employer] Mutual of OmahaJOSEPH,Ioan Landini [facility] Pager [phone]  **Disclaimer: This note may have been dictated with voice recognition software. Similar sounding words can inadvertently be transcribed and this note may contain transcription errors which may not have been corrected upon publication of note.**

## 2014-04-02 ENCOUNTER — Inpatient Hospital Stay (HOSPITAL_COMMUNITY): Payer: Medicaid Other

## 2014-04-02 DIAGNOSIS — I10 Essential (primary) hypertension: Secondary | ICD-10-CM

## 2014-04-02 DIAGNOSIS — R112 Nausea with vomiting, unspecified: Principal | ICD-10-CM

## 2014-04-02 DIAGNOSIS — R509 Fever, unspecified: Secondary | ICD-10-CM

## 2014-04-02 LAB — CBC
HEMATOCRIT: 32.9 % — AB (ref 36.0–46.0)
HEMOGLOBIN: 10.5 g/dL — AB (ref 12.0–15.0)
MCH: 27.9 pg (ref 26.0–34.0)
MCHC: 31.9 g/dL (ref 30.0–36.0)
MCV: 87.3 fL (ref 78.0–100.0)
Platelets: 219 10*3/uL (ref 150–400)
RBC: 3.77 MIL/uL — ABNORMAL LOW (ref 3.87–5.11)
RDW: 14.5 % (ref 11.5–15.5)
WBC: 6 10*3/uL (ref 4.0–10.5)

## 2014-04-02 LAB — COMPREHENSIVE METABOLIC PANEL
ALT: 33 U/L (ref 0–35)
AST: 33 U/L (ref 0–37)
Albumin: 3.1 g/dL — ABNORMAL LOW (ref 3.5–5.2)
Alkaline Phosphatase: 78 U/L (ref 39–117)
Anion gap: 13 (ref 5–15)
BILIRUBIN TOTAL: 0.3 mg/dL (ref 0.3–1.2)
BUN: 12 mg/dL (ref 6–23)
CHLORIDE: 106 meq/L (ref 96–112)
CO2: 22 meq/L (ref 19–32)
Calcium: 8.4 mg/dL (ref 8.4–10.5)
Creatinine, Ser: 0.56 mg/dL (ref 0.50–1.10)
GLUCOSE: 100 mg/dL — AB (ref 70–99)
Potassium: 4.1 mEq/L (ref 3.7–5.3)
Sodium: 141 mEq/L (ref 137–147)
Total Protein: 6.8 g/dL (ref 6.0–8.3)

## 2014-04-02 LAB — URINE CULTURE: Colony Count: 3000

## 2014-04-02 LAB — LIPASE, BLOOD: LIPASE: 9 U/L — AB (ref 11–59)

## 2014-04-02 MED ORDER — HYDROMORPHONE HCL 4 MG PO TABS: 4.0000 mg | ORAL_TABLET | ORAL | Status: DC | PRN

## 2014-04-02 MED ORDER — HYDROMORPHONE HCL PF 1 MG/ML IJ SOLN
1.0000 mg | INTRAMUSCULAR | Status: DC | PRN
  Administered 2014-04-02: 1 mg via INTRAVENOUS
  Filled 2014-04-02: qty 1

## 2014-04-02 MED ORDER — ONDANSETRON 4 MG PO TBDP
4.0000 mg | ORAL_TABLET | Freq: Once | ORAL | Status: AC
  Administered 2014-04-02: 4 mg via ORAL
  Filled 2014-04-02: qty 1

## 2014-04-02 MED ORDER — HYDROMORPHONE HCL 2 MG PO TABS
4.0000 mg | ORAL_TABLET | ORAL | Status: DC | PRN
  Filled 2014-04-02: qty 2

## 2014-04-02 MED ORDER — PROMETHAZINE HCL 25 MG PO TABS: 25.0000 mg | ORAL_TABLET | Freq: Four times a day (QID) | ORAL | Status: DC | PRN

## 2014-04-02 NOTE — Progress Notes (Signed)
Discharge instructions and prescriptions for Phenergan and Dilaudid given to patient. Teachback good. IV removed per order. Discharged via wheelchair to step-mother's care. Trina Ao, RN

## 2014-04-02 NOTE — Progress Notes (Signed)
[facility] PROGRESS NOTE  Mary [name] Perman ZOX:[id number] DOB: Jan 04, 1976 DOA: 04/01/2014 PCP: No PCP Per Patient  Assessment/Plan  Nausea/Vomiting and Abd pain, suspect chronic pancreatitis.  No evidence of inflammation or acute process on CT or [name] and LFTs and lipase are wnl.  Seen by GI here and at [facility], no further workup recommended, during prior hospitalizations drug seeking behaviour was suspected as well.  -  Advance diet -  Start oral dilaudid -  Minimize use of IV pain medication -  D/c IVF -  No vomiting or diarrhea recorded and patient moving around in bed without difficulty  Relapsing remitting multiple sclerosis  -hold Copaxone till FU with Neurology   Fever, flushing, following Copaxone injection, suspect delayed hypersensitivity reaction  -  Leukocytosis and fever resolved without abx -  No evidence of UTI or pneumonia -  BCx NGTD  -  Will need to follow up with PCP  Morbid obesity, life style modification  Chronic normocytic anemia, likely anemia of chronic disease, hemoglobin trended down a little with IVF but no evidence of bleeding.    Diet:  FLD Access:  PIV IVF:  yes Proph:  lovenox  Code Status: full Family Communication: patient alone Disposition Plan: to home later today if tolerating FLD   Consultants:  None  Procedures:  CT abd/pelvis  CXR  Antibiotics:  None   HPI/Subjective:  Denies chest pain, SOB, dysuria.  Has 10/10 severe abdominal pain not being made better by IV pain medication.  Per RN, she received IV dilaudid and 15 minutes later she was very sleepy, slurring her words and asking for more pain medication.    Objective: Filed Vitals:   04/01/14 1806 04/01/14 2100 04/02/14 0531 04/02/14 0946  BP: 104/57 95/51 103/58 121/77  Pulse:  83 73 88  Temp:  97.6 F (36.4 C) 97.3 F (36.3 C) 97.8 F (36.6 C)  TempSrc:  Oral Oral Oral  Resp:  14 16 16   Height:      Weight:      SpO2:  95% 93% 94%    Intake/Output  Summary (Last 24 hours) at 04/02/14 1048 Last data filed at 04/02/14 0920  Gross per 24 hour  Intake    360 ml  Output      0 ml  Net    360 ml   Filed Weights   04/01/14 0409 04/01/14 1620  Weight: 117.935 kg (260 lb) 117.935 kg (260 lb)    Exam:   General:  Obese WF, No acute distress, able to move around in bed for exam without difficulty  HEENT:  NCAT, MMM  Cardiovascular:  RRR, nl S1, S2 no mrg, 2+ pulses, warm extremities  Respiratory:  CTAB, no increased WOB  Abdomen:   NABS, soft, ND, mild TTP in epigastrium without rebound or guarding  MSK:   Normal tone and bulk, no LEE  Neuro:  Grossly intact  Data Reviewed: Basic Metabolic Panel:  Recent Labs Lab 04/01/14 0620  NA 142  K 3.4*  CL 104  CO2 20  GLUCOSE 131*  BUN 14  CREATININE 0.60  CALCIUM 8.9   Liver Function Tests:  Recent Labs Lab 04/01/14 0620  AST 47*  ALT 42*  ALKPHOS 112  BILITOT 0.3  PROT 7.6  ALBUMIN 3.5    Recent Labs Lab 04/01/14 0620  LIPASE 21   No results found for this basename: AMMONIA,  in the last 168 hours CBC:  Recent Labs Lab 04/01/14 0620 04/02/14 0830  WBC  17.9* 6.0  NEUTROABS 16.8*  --   HGB 11.6* 10.5*  HCT 35.0* 32.9*  MCV 83.5 87.3  PLT 227 219   Cardiac Enzymes: No results found for this basename: CKTOTAL, CKMB, CKMBINDEX, TROPONINI,  in the last 168 hours BNP (last 3 results) No results found for this basename: PROBNP,  in the last 8760 hours CBG: No results found for this basename: GLUCAP,  in the last 168 hours  Recent Results (from the past 240 hour(s))  CULTURE, BLOOD (ROUTINE X 2)     Status: None   Collection Time    04/01/14  6:20 AM      Result Value Ref Range Status   Specimen Description BLOOD LEFT HAND   Final   Special Requests BOTTLES DRAWN AEROBIC AND ANAEROBIC 5CC EACH   Final   Culture  Setup Time     Final   Value: 04/01/2014 18:00     Performed at [facility]   Culture     Final   Value:        BLOOD CULTURE  RECEIVED NO GROWTH TO DATE CULTURE WILL BE HELD FOR 5 DAYS BEFORE ISSUING A FINAL NEGATIVE REPORT     Performed at [facility]   Report Status PENDING   Incomplete  CULTURE, BLOOD (ROUTINE X 2)     Status: None   Collection Time    04/01/14  8:20 AM      Result Value Ref Range Status   Specimen Description BLOOD LEFT ARM   Final   Special Requests BOTTLES DRAWN AEROBIC AND ANAEROBIC 6CC   Final   Culture  Setup Time     Final   Value: 04/01/2014 18:00     Performed at [facility]   Culture     Final   Value:        BLOOD CULTURE RECEIVED NO GROWTH TO DATE CULTURE WILL BE HELD FOR 5 DAYS BEFORE ISSUING A FINAL NEGATIVE REPORT     Performed at [facility]   Report Status PENDING   Incomplete     Studies: Ct Abdomen Pelvis W Contrast  04/01/2014   CLINICAL DATA:  Mid abdominal pain.  History of pancreatitis  EXAM: CT ABDOMEN AND PELVIS WITH CONTRAST  TECHNIQUE: Multidetector CT imaging of the abdomen and pelvis was performed using the standard protocol following bolus administration of intravenous contrast.  CONTRAST:  OMNIPAQUE IOHEXOL 300 MG/ML  SOLN  COMPARISON:  CT 07/30/2013, 02/23/2013  FINDINGS: Lung bases are clear.  No pericardial fluid.  No focal hepatic lesion. Patient status post cholecystectomy. No intrahepatic biliary duct dilatation. The common bile duct is normal caliber. The pancreatic head and pancreatic duct are normal. There is a multilobular cystic complex within the tail the pancreas measuring 3.2 x 4.6 cm. This is decreased in size compared to 4.7 x 5.3 cm on most recent CT. This is seen this cystic lesion is present on remote scan from CT 03/15/2013 and MR report of 2002.  The spleen, right adrenal gland, and kidneys are normal. There is a nodule of the left adrenal gland measuring 16 mm is unchanged.  The stomach, small bowel, cecum, and colon are normal. Abdominal aorta is normal caliber. No retroperitoneal periportal lymphadenopathy.  No  free fluid the pelvis. The uterus, ovaries, and bladder normal. No pelvic lymphadenopathy.  No aggressive osseous lesion.  IMPRESSION: 1. Interval contraction of the cystic complex within the tail of the pancreas. 2. No biliary ductal dilatation  or pancreatic duct dilatation. 3. Patient status post cholecystectomy. 4. Stable small left adrenal nodule.   Electronically Signed   By: Genevive BiStewart  Edmunds M.D.   On: 04/01/2014 12:37   Dg Chest Port 1 View  04/02/2014   CLINICAL DATA:  Mid chest pain.  Fever.  EXAM: PORTABLE CHEST - 1 VIEW  COMPARISON:  05/19/2013  FINDINGS: Cardiac silhouette is normal in size. Normal mediastinal and hilar contours.  Lungs are clear allowing for low lung volumes and the semi-erect AP technique. No pleural effusion or pneumothorax.  Old right rib fracture.  Bony thorax is otherwise unremarkable.  IMPRESSION: No acute cardiopulmonary disease.   Electronically Signed   By: Amie Portlandavid  Ormond M.D.   On: 04/02/2014 07:55    Scheduled Meds: . cholecalciferol  5,000 Units Oral Daily  . enoxaparin (LOVENOX) injection  40 mg Subcutaneous Q24H  . lipase/protease/amylase  24,000 Units Oral TID WC  . naphazoline  1 drop Both Eyes TID WC & HS  . pneumococcal 23 valent vaccine  0.5 mL Intramuscular Tomorrow-1000   Continuous Infusions: . sodium chloride 75 mL/hr at 04/02/14 0243    Active Problems:   Relapsing remitting multiple sclerosis   Adjustment disorder with mixed anxiety and depressed mood   Nausea and vomiting   Abdominal pain, epigastric   Cyst and pseudocyst of pancreas   Fever    Time spent: 30 min    Jonmichael Beadnell, [facility]ACKENZIE  [facility] Pager [phone]. If 7PM-7AM, please contact night-coverage at [email or web address], password [facility] 04/02/2014, 10:48 AM  LOS: 1 day

## 2014-04-02 NOTE — Discharge Summary (Signed)
Physician Discharge Summary  Mary [name] VQQ:[id number] DOB: March 24, 1976 DOA: 04/01/2014  PCP: No PCP Per Patient  Admit date: 04/01/2014 Discharge date: 04/02/2014  Recommendations for Outpatient Follow-up:  1. Clear liquid diet and advance slowly 2. Given additional Rx for Dilaudid 4mg  tabs #20 and phenergan 25mg  #20 3. F/u with gastroenterologist within 1 month 4. F/u with neurologist to discuss copaxone  5. PCP in 1 week.  F/u on pending blood cultures.    Discharge Diagnoses:  Active Problems:   Relapsing remitting multiple sclerosis   Adjustment disorder with mixed anxiety and depressed mood   Nausea and vomiting   Abdominal pain, epigastric   Cyst and pseudocyst of pancreas   Fever   Discharge Condition: stable, improved  Diet recommendation: CLD and advance to low fat foods  Wt Readings from Last 3 Encounters:  04/01/14 117.935 kg (260 lb)  02/15/14 118.348 kg (260 lb 14.6 oz)  02/11/14 106.686 kg (235 lb 3.2 oz)    History of present illness:  The patient is a 38 yo F with chronic pancreatitis, pancreatic cyst, relapsing-remitting MS, GERD, anxiety and depression who presented with nausea, vomiting, abdominal pain.  Additionally, she developed flushing, nausea, and fever after administering a copaxone injection.  In the ER, she was initially febrile and tachycardic.  Her pain did not respond to multiple doses of IV dilaudid and she was admitted for further evaluation and care.  Labs unremarkable, lipase WNL and CT without acute findings and interval contraction of the cyst in the tail of the pancreas  Hospital Course:   Nausea/Vomiting and Abd pain, suspect chronic pancreatitis. No evidence of inflammation or acute process on CT or [name] and LFTs and lipase are wnl. Seen by GI here and at [facility] [place], no further workup recommended by either institution. During prior hospitalizations, she exhibited drug seeking behavior.  She was started on CLD and advanced to full liquid diet.   She was given IV dilaudid 1mg  which would cause her to become drowsy but despite being somnolent, she continued to ask for more IV pain medication.  She was transitioned to oral pain and nausea medications and her diet was advanced.  She was advised to drink gatoraide and other clear liquids until she was able to start eating bland, low fat foods.  F/u with GI within 1 month or sooner as needed.  Relapsing remitting multiple sclerosis, hold Copaxone till FU with Neurology.   Fever, flushing, following Copaxone injection, suspect delayed hypersensitivity reaction.  Leukocytosis and fever resolved without abx.  No evidence of UTI or pneumonia.  BCx NGTD.    Morbid obesity, life style modification   Leukocytosis, resolved without antibiotics and with IV fluids alone.  See above  Chronic normocytic anemia, likely anemia of chronic disease, hemoglobin trended down a little with IVF but no evidence of bleeding.    Consultants:  None Procedures:  CT abd/pelvis  CXR Antibiotics:  None   Discharge Exam: Filed Vitals:   04/02/14 1417  BP: 90/55  Pulse: 76  Temp: 97.9 F (36.6 C)  Resp: 16   Filed Vitals:   04/01/14 2100 04/02/14 0531 04/02/14 0946 04/02/14 1417  BP: 95/51 103/58 121/77 90/55  Pulse: 83 73 88 76  Temp: 97.6 F (36.4 C) 97.3 F (36.3 C) 97.8 F (36.6 C) 97.9 F (36.6 C)  TempSrc: Oral Oral Oral Oral  Resp: 14 16 16 16   Height:      Weight:      SpO2: 95% 93% 94% 95%  General: Obese WF, No acute distress, able to move around in bed for exam without difficulty and walk to bathroom HEENT: NCAT, MMM  Cardiovascular: RRR, nl S1, S2 no mrg, 2+ pulses, warm extremities  Respiratory: CTAB, no increased WOB  Abdomen: NABS, soft, ND, mild TTP in epigastrium without rebound or guarding  MSK: Normal tone and bulk, no LEE  Neuro: Grossly intact   Discharge Instructions      Discharge Instructions   Call MD for:  difficulty breathing, headache or visual  disturbances    Complete by:  As directed      Call MD for:  extreme fatigue    Complete by:  As directed      Call MD for:  hives    Complete by:  As directed      Call MD for:  persistant dizziness or light-headedness    Complete by:  As directed      Call MD for:  persistant nausea and vomiting    Complete by:  As directed      Call MD for:  severe uncontrolled pain    Complete by:  As directed      Call MD for:  temperature >100.4    Complete by:  As directed      Discharge instructions    Complete by:  As directed   You were hospitalized with drug reaction to copaxone.  You may also have had a flare of your chronic pancreatitis, however, your labs and your US and your CT scan looked okay and actually improved from before.  Please talk to your neurologist about your MS treatment options.  For your pancreatitis, please stay hydrated with gatoraide or similar sports beverages.  Use clear liquids to stay hydrated and if tolerated, it is okay to add in bland, low fat foods gradually.     Driving Restrictions    Complete by:  As directed   Do not drive or operate heavy machinery while taking phenergan and dilaudid     Increase activity slowly    Complete by:  As directed             Medication List    STOP taking these medications       Glatiramer Acetate 40 MG/ML Sosy  Commonly known as:  COPAXONE      TAKE these medications       b complex vitamins tablet  Take 1 tablet by mouth daily.     cholecalciferol 1000 UNITS tablet  Commonly known as:  VITAMIN D  Take 5,000 Units by mouth daily.     cyclobenzaprine 5 MG tablet  Commonly known as:  FLEXERIL  Take 5 mg by mouth 3 (three) times daily as needed for muscle spasms.     HYDROmorphone 4 MG tablet  Commonly known as:  DILAUDID  Take 1 tablet (4 mg total) by mouth every 4 (four) hours as needed for severe pain.     hydrOXYzine 50 MG tablet  Commonly known as:  ATARAX/VISTARIL  Take 50 mg by mouth every 6 (six)  hours as needed for anxiety.     lipase/protease/amylase 1610912000 UNITS Cpep capsule  Commonly known as:  CREON  Take 2 capsules by mouth 3 (three) times daily with meals.     naphazoline 0.1 % ophthalmic solution  Commonly known as:  NAPHCON  Place 1 drop into both eyes 4 (four) times daily -  with meals and at bedtime.     promethazine 25 MG  tablet  Commonly known as:  PHENERGAN  Take 1 tablet (25 mg total) by mouth every 6 (six) hours as needed for nausea or vomiting.     triamcinolone 0.1 % cream : eucerin Crea  Apply 1 application topically 3 (three) times daily.       Follow-up Information   Follow up with Yancey Flemings, MD. Schedule an appointment as soon as possible for a visit in 1 month. (or sooner as needed)    Specialty:  Gastroenterology   Contact information:   [address] [place] [address] [phone]       Follow up with JAFFE, ADAM ROBERT, DO. Schedule an appointment as soon as possible for a visit in 2 weeks.   Specialty:  Neurology   Contact information:   [address]  [address] [place] [address] [phone]        The results of significant diagnostics from this hospitalization (including imaging, microbiology, ancillary and laboratory) are listed below for reference.    Significant Diagnostic Studies: US Abdomen Complete  03/11/2014   CLINICAL DATA:  38 year old female with abdominal pain. Acute pancreatitis. History of cholecystectomy.  EXAM: ULTRASOUND ABDOMEN COMPLETE  COMPARISON:  07/30/2013 and prior CTs  FINDINGS: Gallbladder:  The gallbladder is not visualized compatible with cholecystectomy.  Common bile duct:  Diameter: 5.3 mm. There is no evidence of intrahepatic or extrahepatic biliary dilatation.  Liver:  Diffuse increased echogenicity of the liver is compatible with fatty infiltration. No focal hepatic abnormalities are noted.  IVC:  No abnormality visualized.  Pancreas:  The pancreas is not well visualized secondary to  overlying bowel gas and body habitus.  Spleen:  Size and appearance within normal limits.  Right Kidney:  Length: 12.8 cm. Echogenicity within normal limits. No mass or hydronephrosis visualized.  Left Kidney:  Length: 0.6 cm. Echogenicity within normal limits. No mass or hydronephrosis visualized.  Abdominal aorta:  No aneurysm identified but portions of the abdominal aorta are not well visualized secondary to overlying bowel gas and body habitus.  Other findings:  None.  IMPRESSION: No evidence of acute abnormality.  Fatty infiltration of the liver.  Pancreas and portions of the abdominal aorta not well visualized secondary to overlying bowel gas and body habitus.   Electronically Signed   By: Laveda Abbe M.D.   On: 03/11/2014 15:20   Ct Abdomen Pelvis W Contrast  04/01/2014   CLINICAL DATA:  Mid abdominal pain.  History of pancreatitis  EXAM: CT ABDOMEN AND PELVIS WITH CONTRAST  TECHNIQUE: Multidetector CT imaging of the abdomen and pelvis was performed using the standard protocol following bolus administration of intravenous contrast.  CONTRAST:  OMNIPAQUE IOHEXOL 300 MG/ML  SOLN  COMPARISON:  CT 07/30/2013, 02/23/2013  FINDINGS: Lung bases are clear.  No pericardial fluid.  No focal hepatic lesion. Patient status post cholecystectomy. No intrahepatic biliary duct dilatation. The common bile duct is normal caliber. The pancreatic head and pancreatic duct are normal. There is a multilobular cystic complex within the tail the pancreas measuring 3.2 x 4.6 cm. This is decreased in size compared to 4.7 x 5.3 cm on most recent CT. This is seen this cystic lesion is present on remote scan from CT 03/15/2013 and MR report of 2002.  The spleen, right adrenal gland, and kidneys are normal. There is a nodule of the left adrenal gland measuring 16 mm is unchanged.  The stomach, small bowel, cecum, and colon are normal. Abdominal aorta is normal caliber. No  retroperitoneal periportal lymphadenopathy.  No free fluid the  pelvis. The uterus, ovaries, and bladder normal. No pelvic lymphadenopathy.  No aggressive osseous lesion.  IMPRESSION: 1. Interval contraction of the cystic complex within the tail of the pancreas. 2. No biliary ductal dilatation or pancreatic duct dilatation. 3. Patient status post cholecystectomy. 4. Stable small left adrenal nodule.   Electronically Signed   By: Genevive Bi M.D.   On: 04/01/2014 12:37   Dg Chest Port 1 View  04/02/2014   CLINICAL DATA:  Mid chest pain.  Fever.  EXAM: PORTABLE CHEST - 1 VIEW  COMPARISON:  05/19/2013  FINDINGS: Cardiac silhouette is normal in size. Normal mediastinal and hilar contours.  Lungs are clear allowing for low lung volumes and the semi-erect AP technique. No pleural effusion or pneumothorax.  Old right rib fracture.  Bony thorax is otherwise unremarkable.  IMPRESSION: No acute cardiopulmonary disease.   Electronically Signed   By: Amie Portland M.D.   On: 04/02/2014 07:55    Microbiology: Recent Results (from the past 240 hour(s))  CULTURE, BLOOD (ROUTINE X 2)     Status: None   Collection Time    04/01/14  6:20 AM      Result Value Ref Range Status   Specimen Description BLOOD LEFT HAND   Final   Special Requests BOTTLES DRAWN AEROBIC AND ANAEROBIC 5CC EACH   Final   Culture  Setup Time     Final   Value: 04/01/2014 18:00     Performed at [facility]   Culture     Final   Value:        BLOOD CULTURE RECEIVED NO GROWTH TO DATE CULTURE WILL BE HELD FOR 5 DAYS BEFORE ISSUING A FINAL NEGATIVE REPORT     Performed at [facility]   Report Status PENDING   Incomplete  URINE CULTURE     Status: None   Collection Time    04/01/14  7:27 AM      Result Value Ref Range Status   Specimen Description URINE, CLEAN CATCH   Final   Special Requests NONE   Final   Culture  Setup Time     Final   Value: 04/01/2014 18:34     Performed at [facility] Count     Final   Value: 3,000 COLONIES/ML     Performed at [facility]   Culture     Final   Value: INSIGNIFICANT GROWTH     Performed at [facility]   Report Status 04/02/2014 FINAL   Final  CULTURE, BLOOD (ROUTINE X 2)     Status: None   Collection Time    04/01/14  8:20 AM      Result Value Ref Range Status   Specimen Description BLOOD LEFT ARM   Final   Special Requests BOTTLES DRAWN AEROBIC AND ANAEROBIC [facility]   Final   Culture  Setup Time     Final   Value: 04/01/2014 18:00     Performed at [facility]   Culture     Final   Value:        BLOOD CULTURE RECEIVED NO GROWTH TO DATE CULTURE WILL BE HELD FOR 5 DAYS BEFORE ISSUING A FINAL NEGATIVE REPORT     Performed at [facility]   Report Status PENDING   Incomplete     Labs: Basic Metabolic Panel:  Recent Labs Lab 04/01/14 0620 04/02/14 0830  NA  142 141  K 3.4* 4.1  CL 104 106  CO2 20 22  GLUCOSE 131* 100*  BUN 14 12  CREATININE 0.60 0.56  CALCIUM 8.9 8.4   Liver Function Tests:  Recent Labs Lab 04/01/14 0620 04/02/14 0830  AST 47* 33  ALT 42* 33  ALKPHOS 112 78  BILITOT 0.3 0.3  PROT 7.6 6.8  ALBUMIN 3.5 3.1*    Recent Labs Lab 04/01/14 0620 04/02/14 0830  LIPASE 21 9*   No results found for this basename: AMMONIA,  in the last 168 hours CBC:  Recent Labs Lab 04/01/14 0620 04/02/14 0830  WBC 17.9* 6.0  NEUTROABS 16.8*  --   HGB 11.6* 10.5*  HCT 35.0* 32.9*  MCV 83.5 87.3  PLT 227 219   Cardiac Enzymes: No results found for this basename: CKTOTAL, CKMB, CKMBINDEX, TROPONINI,  in the last 168 hours BNP: BNP (last 3 results) No results found for this basename: PROBNP,  in the last 8760 hours CBG: No results found for this basename: GLUCAP,  in the last 168 hours  Time coordinating discharge: 45 minutes  Signed:  Kylin Genna  [facility] 04/02/2014, 3:12 PM

## 2014-04-05 ENCOUNTER — Encounter: Payer: Self-pay | Admitting: Neurology

## 2014-04-05 ENCOUNTER — Ambulatory Visit (INDEPENDENT_AMBULATORY_CARE_PROVIDER_SITE_OTHER): Payer: Self-pay | Admitting: Neurology

## 2014-04-05 VITALS — BP 128/64 | HR 76 | Temp 97.3°F | Resp 18 | Wt 250.2 lb

## 2014-04-05 DIAGNOSIS — G379 Demyelinating disease of central nervous system, unspecified: Secondary | ICD-10-CM

## 2014-04-05 DIAGNOSIS — G35 Multiple sclerosis: Secondary | ICD-10-CM

## 2014-04-05 NOTE — Progress Notes (Signed)
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  She is accompanied by a family friend.  Records and images were personally reviewed where available.    UPDATE: In January, she was started on Copaxone.  She initially noted some dizziness but was overall doing okay.  Last week, she reported a severe reaction after taking the Copaxone, described as shortness of breath, chest tightness and cold sweats.  She briefly fell asleep and when she woke up, she says she was shivering uncontrollably, to the point that she could not move.  She has since stopped taking it.    She was admitted to [facility] on 02/11/14 for acute right-sided weakness of arm and leg, numbness and pain in back and right leg.  Exam revealed right sided weakness but with poor effort.  She exhibited mild numbness in right leg.  She also endorsed blurred vision in the right eye. She also endorsed generalized myalgia, including chest pain and back pain.  She endorsed more hesitancy in speech.  She received Solumedrol 1g IV for 3 days.  MRI of the brain and cervical spine with and without contrast was stable compared to prior study and did not reveal any acute demyelination.  She had no source of infection.  She was started on Flexeril 13m for muscle spasms and gabapentin for neuropathic pain.  She had been on opiates for pain as well.  She was in inpatient rehab for about a week.  She regained a lot of her strength back in the arm and the leg, but still notes weakness in the leg.  She required an AFO for foot drop.  Currently, she is ambulating with AFO and a cane.  She was admitted twice to the hospital over the  summer for acute on chronic pancreatitis.  HISTORY: She has a history of recurrent pancreatitis with pancreatic cyst of unknown etiology.  She was admitted to the hospital in late-August and early-September for pancreatitis associated with nausea, vomiting and diarrhea.  She underwent a CT of the head on 04/22/13 to rule out intracranial cause of nausea and vomiting, which revealed extensive hypodensity in the bilateral primarily subcortical white matter.  A follow up MRI of the brain with and without contrast performed on 04/22/13 revealed extensive white matter disease without abnormal enhancement.  She had no prior imaging to compare.  She underwent a lumbar puncture, which revealed protein 25, glucose 69, cell count 2, negative gram stain, IgG 3.3, and positive OCBs (3).  Serum B12 was 423.  ANA was positive (1:160, speckled pattern), ESR 47, RF <10, Lyme abs negative, Myeloperoxidase/serine protease 3 abs negative, antiproteinase 3 abs negative, SSA/SSB abs negative, vitamin D 25 hydroxy 20.  Repeat MRI of brain with and without contrast performed on 09/06/13 was stable with no new or enhancing lesions.  MRI of cervical spine with and without contrast was negative.  In retrospect, she reports transient numbness in the hands (left worse than right), as well as transient numbness in the right leg with occasional weakness of the right leg and stumbling.  The mild right hip weakness has been chronic, mild and unchanged for quite some time.  She denies prior history of vision loss or ocular  pain.  She denies history of pain down the spine.    Since the diagnosis, she has experienced increased symptoms.  Shereported increased problems with her right leg, causing her to stumble.  It is intermittent and doesn't happen all the time.  She notes "joint" pain, noticeable in the right elbow, back, right hip and knee.  Sometimes it is difficult to cross her right leg on her left knee.  She sometimes feels she needs  intermittent use of a cane.  She also notes increased fatigue and sometimes her entire body just feels "heavy" and she is unable to get out of bed.  She reports occasional neck stiffness and intermittent numbness of right hand.  She does report increased stress and anxiety over the past 2 months regarding everything going on, as well as stress at home.  She and her mother live with her brother's three children, ages 48, 59 and 67.  She reported stuttering.  Since moving to [place] from [place], she has not been working, particularly because she required multiple hospitalizations for her nausea and vomiting.    She has an occasional migraine but nothing severe.  She denies family history of neurological conditions.  PAST MEDICAL HISTORY: Past Medical History  Diagnosis Date  . Hernia 03-24-13    ventral hernia remains   . GERD (gastroesophageal reflux disease)   . Pancreatitis 03-24-13    2002, 2'2013, 03-14-13  . Pancreatic cyst 2002 onset  . Anxiety   . Depression     "recent breakup with partner of 15 yrs"  . Adrenal insufficiency 04/23/2013    ??  . MS (multiple sclerosis)     MEDICATIONS: Current Outpatient Prescriptions on File Prior to Visit  Medication Sig Dispense Refill  . b complex vitamins tablet Take 1 tablet by mouth daily.      . cholecalciferol (VITAMIN D) 1000 UNITS tablet Take 5,000 Units by mouth daily.      . cyclobenzaprine (FLEXERIL) 5 MG tablet Take 5 mg by mouth 3 (three) times daily as needed for muscle spasms.      Marland Kitchen HYDROmorphone (DILAUDID) 4 MG tablet Take 1 tablet (4 mg total) by mouth every 4 (four) hours as needed for severe pain.  20 tablet  0  . hydrOXYzine (ATARAX/VISTARIL) 50 MG tablet Take 50 mg by mouth every 6 (six) hours as needed for anxiety.      . lipase/protease/amylase (CREON-12/PANCREASE) 12000 UNITS CPEP capsule Take 2 capsules by mouth 3 (three) times daily with meals.  180 capsule  2  . naphazoline (NAPHCON) 0.1 % ophthalmic solution Place 1  drop into both eyes 4 (four) times daily -  with meals and at bedtime.  15 mL  0  . promethazine (PHENERGAN) 25 MG tablet Take 1 tablet (25 mg total) by mouth every 6 (six) hours as needed for nausea or vomiting.  30 tablet  0  . Triamcinolone Acetonide (TRIAMCINOLONE 0.1 % CREAM : EUCERIN) CREA Apply 1 application topically 3 (three) times daily.  1 each  0   No current facility-administered medications on file prior to visit.    ALLERGIES: Allergies  Allergen Reactions  . Amoxicillin Anaphylaxis  . Penicillins Anaphylaxis  . Latex Itching  . Morphine And Related Nausea And Vomiting    FAMILY HISTORY: Family History  Problem Relation Age of Onset  . Lupus Neg Hx   . Sarcoidosis Neg Hx   . Pancreatitis Neg Hx   . Ataxia Neg Hx   . Chorea Neg Hx   .  Dementia Neg Hx   . Mental retardation Neg Hx   . Migraines Neg Hx   . Multiple sclerosis Neg Hx   . Neurofibromatosis Neg Hx   . Neuropathy Neg Hx   . Parkinsonism Neg Hx   . Seizures Neg Hx   . Stroke Neg Hx   . Colitis Maternal Aunt   . Diabetes Mother   . Diabetes Father   . Diabetes      many family members    SOCIAL HISTORY: History   Social History  . Marital Status: Single    Spouse Name: N/A    Number of Children: N/A  . Years of Education: N/A   Occupational History  . Not on file.   Social History Main Topics  . Smoking status: Former Smoker -- 0.50 packs/day for 20 years    Types: Cigarettes    Quit date: 05/26/2013  . Smokeless tobacco: Never Used  . Alcohol Use: No  . Drug Use: No  . Sexual Activity: No   Other Topics Concern  . Not on file   Social History Narrative   Lives permanently in [place] with mom and stepfather.  Has not started working yet and was unemployed in [place].             REVIEW OF SYSTEMS: Constitutional: No fevers, chills, or sweats, no generalized fatigue, change in appetite Eyes: No visual changes, double vision, eye pain Ear, nose and throat: No hearing loss, ear  pain, nasal congestion, sore throat Cardiovascular: No chest pain, palpitations Respiratory:  No shortness of breath at rest or with exertion, wheezes GastrointestinaI: No nausea, vomiting, diarrhea, abdominal pain, fecal incontinence Genitourinary:  No dysuria, urinary retention or frequency Musculoskeletal:  No neck pain, back pain Integumentary: No rash, pruritus, skin lesions Neurological: as above Psychiatric: No depression, insomnia, anxiety Endocrine: No palpitations, fatigue, diaphoresis, mood swings, change in appetite, change in weight, increased thirst Hematologic/Lymphatic:  No anemia, purpura, petechiae. Allergic/Immunologic: no itchy/runny eyes, nasal congestion, recent allergic reactions, rashes  PHYSICAL EXAM: Filed Vitals:   04/05/14 0926  BP: 128/64  Pulse: 76  Temp: 97.3 F (36.3 C)  Resp: 18   General: No acute distress Head:  Normocephalic/atraumatic Neck: supple, no paraspinal tenderness, full range of motion Heart:  Regular rate and rhythm Lungs:  Clear to auscultation bilaterally Back: No paraspinal tenderness Neurological Exam: alert and oriented to person, place, and time. Attention span and concentration intact, recent and remote memory intact, fund of knowledge intact.  Speech fluent and not dysarthric, language intact.  Mild reduced sensation right V1-3, otherwise CN II-XII intact. Fundoscopic exam unremarkable without vessel changes, exudates, hemorrhages or papilledema.  Bulk and tone normal, muscle strength 4-/5 RUE, 3+/5 RLE (except 4+ hamstrings).  However, it was difficult to accurately determine due to poor effort.  Strength otherwise was 5/5.  Reduced pinprick in right lower extremity, vibration intact.  Deep tendon reflexes 2+ throughout, toes downgoing.  Finger to nose and heel to shin testing intact.  Gait with right limp, using cane. Romberg negative.  IMPRESSION: Demyelinating disease.  She has an abnormal MRI of the brain and banding in the  CSF.  Therefore, MS is certainly possible.  Initially I did not want to make a definite diagnosis because she reported no prior history of events.  But in retrospect, she then mentioned these vague nonspecific symptoms, such as mild clumsiness of the right leg and numbness in the hands, so it was decided to try a disease-modifying agent.  However, I am not completely certain that her subsequent symptoms are specifically due to MS.  The MRI findings were incidental and only afterwards did she begin experiencing this multiple symptomatology.  The recent flare-up revealed no new or active lesions on MRI.  In retrospect, she reports some mild symptoms involving her right leg, so this could conceivably be a pseudo-flareup, however she has never had any episode of this severity.  Psychogenic factors related to adjustment disorder and anxiety due to this diagnosis may be playing a role as well.  PLAN: Ultimately, I want to be sure that we are treating her appropriately.  I would like her to get a second opinion from an [place] at [facility] to see if she likely has MS and that these recent symptoms are likely related to [place] or possibly another etiology.  I will also check a hypercoagulable workup.  Previous labs for vasculitis were unremarkable.  If it is determined by the specialist that she likely has MS and these symptoms are related to demyelinating disease, then we will proceed from there.  She was upset by this news, particularly since it has been 7 months since the diagnosis.  I explained that I just want to make sure she is getting the appropriate and safe treatment.  I answered questions to the best of my ability.  We will refer her to pain management.  30 minutes spent with patient and her friend, over 50% spent counseling, discussing possible etiology and coordinating care.  Metta Clines, DO

## 2014-04-05 NOTE — Patient Instructions (Addendum)
Re-evaluating everything, I want to be sure that you have MS.  Before initiating another agent, I would like you to be evaluated by an MS specialist.  We will refer you to [facility].  In the meantime, I will check another causes for the spots on the brain.  We will check a hypercoagulable panel.  I will refer you to pain management.  I will see you afterwards.

## 2014-04-06 LAB — ANTITHROMBIN III: AntiThromb III Func: 114 % (ref 76–126)

## 2014-04-06 LAB — LUPUS ANTICOAGULANT PANEL
DRVVT: 35.9 secs (ref <42.9)
Lupus Anticoagulant: NOT DETECTED
PTT LA: 32.8 s (ref 28.0–43.0)

## 2014-04-06 LAB — PROTIME-INR
INR: 0.89 (ref <1.50)
Prothrombin Time: 12.1 seconds (ref 11.6–15.2)

## 2014-04-06 LAB — HOMOCYSTEINE: Homocysteine: 12.2 umol/L (ref 4.0–15.4)

## 2014-04-06 LAB — APTT: APTT: 27 s (ref 24–37)

## 2014-04-06 LAB — THROMBIN TIME: THROMBIN TIME: 18.3 s (ref <21.0)

## 2014-04-07 LAB — PROTEIN S, TOTAL AND FUNCTIONAL PANEL
PROTEIN S ACTIVITY: 105 % (ref 69–129)
Protein S Total: 107 % (ref 60–150)

## 2014-04-07 LAB — ANCA TITERS

## 2014-04-07 LAB — ANCA SCREEN W REFLEX TITER
Atypical p-ANCA Screen: NEGATIVE
c-ANCA Screen: NEGATIVE
p-ANCA Screen: POSITIVE — AB

## 2014-04-07 LAB — CULTURE, BLOOD (ROUTINE X 2): CULTURE: NO GROWTH

## 2014-04-07 LAB — PROTEIN C, TOTAL AND FUNCTIONAL PANEL
Protein C Activity: 150 % — ABNORMAL HIGH (ref 75–133)
Protein C, Total: 83 % (ref 72–160)

## 2014-04-08 LAB — CULTURE, BLOOD (ROUTINE X 2)

## 2014-04-08 LAB — PROTHROMBIN GENE MUTATION

## 2014-04-08 LAB — FACTOR 5 LEIDEN

## 2014-04-26 ENCOUNTER — Encounter: Payer: Medicaid Other | Attending: Physical Medicine & Rehabilitation | Admitting: Physical Medicine & Rehabilitation

## 2014-06-14 ENCOUNTER — Encounter (HOSPITAL_COMMUNITY): Payer: Self-pay | Admitting: Emergency Medicine

## 2014-06-14 ENCOUNTER — Emergency Department (HOSPITAL_COMMUNITY)
Admission: EM | Admit: 2014-06-14 | Discharge: 2014-06-15 | Disposition: A | Discharge status: Home / Self Care | Payer: Medicaid Other | Attending: Emergency Medicine | Admitting: Emergency Medicine

## 2014-06-14 ENCOUNTER — Emergency Department (HOSPITAL_COMMUNITY): Payer: Medicaid Other

## 2014-06-14 DIAGNOSIS — Z8669 Personal history of other diseases of the nervous system and sense organs: Secondary | ICD-10-CM | POA: Insufficient documentation

## 2014-06-14 DIAGNOSIS — R109 Unspecified abdominal pain: Secondary | ICD-10-CM

## 2014-06-14 DIAGNOSIS — Z8639 Personal history of other endocrine, nutritional and metabolic disease: Secondary | ICD-10-CM | POA: Insufficient documentation

## 2014-06-14 DIAGNOSIS — Z87891 Personal history of nicotine dependence: Secondary | ICD-10-CM | POA: Diagnosis not present

## 2014-06-14 DIAGNOSIS — Z7952 Long term (current) use of systemic steroids: Secondary | ICD-10-CM | POA: Insufficient documentation

## 2014-06-14 DIAGNOSIS — K863 Pseudocyst of pancreas: Secondary | ICD-10-CM | POA: Insufficient documentation

## 2014-06-14 DIAGNOSIS — K219 Gastro-esophageal reflux disease without esophagitis: Secondary | ICD-10-CM | POA: Insufficient documentation

## 2014-06-14 DIAGNOSIS — F419 Anxiety disorder, unspecified: Secondary | ICD-10-CM | POA: Diagnosis not present

## 2014-06-14 DIAGNOSIS — Z79899 Other long term (current) drug therapy: Secondary | ICD-10-CM | POA: Diagnosis not present

## 2014-06-14 DIAGNOSIS — R000 Tachycardia, unspecified: Secondary | ICD-10-CM | POA: Insufficient documentation

## 2014-06-14 DIAGNOSIS — Z91040 Latex allergy status: Secondary | ICD-10-CM | POA: Insufficient documentation

## 2014-06-14 DIAGNOSIS — Z3202 Encounter for pregnancy test, result negative: Secondary | ICD-10-CM | POA: Insufficient documentation

## 2014-06-14 DIAGNOSIS — K859 Acute pancreatitis, unspecified: Secondary | ICD-10-CM | POA: Diagnosis not present

## 2014-06-14 DIAGNOSIS — Z880 Allergy status to penicillin: Secondary | ICD-10-CM | POA: Insufficient documentation

## 2014-06-14 DIAGNOSIS — R112 Nausea with vomiting, unspecified: Secondary | ICD-10-CM

## 2014-06-14 DIAGNOSIS — R1013 Epigastric pain: Secondary | ICD-10-CM | POA: Diagnosis present

## 2014-06-14 LAB — URINALYSIS, ROUTINE W REFLEX MICROSCOPIC
BILIRUBIN URINE: NEGATIVE
Glucose, UA: NEGATIVE mg/dL
HGB URINE DIPSTICK: NEGATIVE
KETONES UR: NEGATIVE mg/dL
Nitrite: NEGATIVE
Protein, ur: NEGATIVE mg/dL
Specific Gravity, Urine: 1.014 (ref 1.005–1.030)
Urobilinogen, UA: 0.2 mg/dL (ref 0.0–1.0)
pH: 7 (ref 5.0–8.0)

## 2014-06-14 LAB — CBC WITH DIFFERENTIAL/PLATELET
BASOS PCT: 0 % (ref 0–1)
Basophils Absolute: 0 10*3/uL (ref 0.0–0.1)
EOS ABS: 0.2 10*3/uL (ref 0.0–0.7)
Eosinophils Relative: 2 % (ref 0–5)
HCT: 35.8 % — ABNORMAL LOW (ref 36.0–46.0)
Hemoglobin: 11.9 g/dL — ABNORMAL LOW (ref 12.0–15.0)
Lymphocytes Relative: 23 % (ref 12–46)
Lymphs Abs: 2.2 10*3/uL (ref 0.7–4.0)
MCH: 27.2 pg (ref 26.0–34.0)
MCHC: 33.2 g/dL (ref 30.0–36.0)
MCV: 81.7 fL (ref 78.0–100.0)
Monocytes Absolute: 0.6 10*3/uL (ref 0.1–1.0)
Monocytes Relative: 7 % (ref 3–12)
NEUTROS PCT: 68 % (ref 43–77)
Neutro Abs: 6.2 10*3/uL (ref 1.7–7.7)
PLATELETS: 322 10*3/uL (ref 150–400)
RBC: 4.38 MIL/uL (ref 3.87–5.11)
RDW: 13.9 % (ref 11.5–15.5)
WBC: 9.3 10*3/uL (ref 4.0–10.5)

## 2014-06-14 LAB — URINE MICROSCOPIC-ADD ON

## 2014-06-14 LAB — POC URINE PREG, ED: Preg Test, Ur: NEGATIVE

## 2014-06-14 MED ORDER — PROMETHAZINE HCL 25 MG/ML IJ SOLN
25.0000 mg | Freq: Once | INTRAMUSCULAR | Status: AC
  Administered 2014-06-14: 25 mg via INTRAVENOUS
  Filled 2014-06-14: qty 1

## 2014-06-14 MED ORDER — HYDROMORPHONE HCL 1 MG/ML IJ SOLN
1.0000 mg | Freq: Once | INTRAMUSCULAR | Status: AC
  Administered 2014-06-14: 1 mg via INTRAVENOUS
  Filled 2014-06-14: qty 1

## 2014-06-14 MED ORDER — IOHEXOL 300 MG/ML  SOLN
100.0000 mL | Freq: Once | INTRAMUSCULAR | Status: AC | PRN
  Administered 2014-06-14: 100 mL via INTRAVENOUS

## 2014-06-14 MED ORDER — SODIUM CHLORIDE 0.9 % IV BOLUS (SEPSIS)
1000.0000 mL | Freq: Once | INTRAVENOUS | Status: AC
  Administered 2014-06-14: 1000 mL via INTRAVENOUS

## 2014-06-14 NOTE — ED Provider Notes (Signed)
CSN: [id number]     Arrival date & time 06/14/14  2109 History   First MD Initiated Contact with Patient 06/14/14 2151     Chief Complaint  Patient presents with  . Abdominal Pain     (Consider location/radiation/quality/duration/timing/severity/associated sxs/prior Treatment) HPI Comments: Mary [name] is a 38 y.o. female with a PMHx of ventral hernia, GERD, recurrent pancreatitis, pancreatic cyst, anxiety, depression, and MS, and a PSHx of pancreatic cyst drainage, cholecystectomy, and roux-en-y, who presents to the ED with complaints of "a pancreatitis flare". She states she developed 9/10 sharp constant epigastric pain radiating to her back that started after eating dinner tonight. Pain is worse with lying flat, and improved by "rocking back and forth." she states this feels exactly the same as her prior pancreatitis flares. Denies trying any medications prior to coming to the ED. Associated symptoms include nonbloody nonbilious emesis x3 episodes of undigested food. Denies fevers, chills, CP, SOB, cough, hematemesis, constipation, diarrhea, heart burn, obstipation, melena, hematochezia, dysuria, hematuria, vaginal discharge/bleeding, myalgias or arthralgias. Denies EtOH use, NSAID use, recent travel, suspicious food intake, or sick contacts. Does not currently have a PCP, but just got MCD and as of 06/19/14 she will be going to see [facility].  Patient is a 38 y.o. female presenting with abdominal pain. The history is provided by the patient. No language interpreter was used.  Abdominal Pain Pain location:  Epigastric Pain quality: sharp   Pain radiates to:  Back Pain severity:  Severe (9/10) Onset quality:  Sudden Duration:  2 hours Timing:  Constant Progression:  Unchanged Chronicity:  Recurrent (same as prior episodes of pancreatitis) Context: eating (began after dinner)   Context: not alcohol use, not recent travel, not sick contacts and not suspicious food intake     Relieved by:  Position changes (rocking backwards and forwards) Worsened by:  Position changes (lying flat) Ineffective treatments:  None tried Associated symptoms: nausea and vomiting   Associated symptoms: no belching, no chest pain, no chills, no constipation, no diarrhea, no dysuria, no fatigue, no fever, no flatus, no hematemesis, no hematochezia, no hematuria, no melena, no shortness of breath, no sore throat, no vaginal bleeding and no vaginal discharge   Risk factors: obesity   Risk factors: no alcohol abuse and no NSAID use     Past Medical History  Diagnosis Date  . Hernia 03-24-13    ventral hernia remains   . GERD (gastroesophageal reflux disease)   . Pancreatitis 03-24-13    2002, 2'2013, 03-14-13  . Pancreatic cyst 2002 onset  . Anxiety   . Depression     "recent breakup with partner of 15 yrs"  . Adrenal insufficiency 04/23/2013    ??  . MS (multiple sclerosis)    Past Surgical History  Procedure Laterality Date  . Abdominal surgery  ~ 2007    some sort of pancreatic cyst drainage.   . Cholecystectomy      ~ age 79-laparoscopic  . Adenoidectomy    . 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 Eus N/A 04/14/2013    Procedure: UPPER ENDOSCOPIC ULTRASOUND (EUS) LINEAR;  Surgeon: Rachael Fee, MD;  Location: [facility];  Service: Endoscopy;  Laterality: N/A;   Family History  Problem Relation Age of Onset  . Lupus Neg Hx   . Sarcoidosis Neg Hx   . Pancreatitis Neg Hx   . Ataxia Neg Hx   . Chorea  Neg Hx   . Dementia Neg Hx   . Mental retardation Neg Hx   . Migraines Neg Hx   . Multiple sclerosis Neg Hx   . Neurofibromatosis Neg Hx   . Neuropathy Neg Hx   . Parkinsonism Neg Hx   . Seizures Neg Hx   . Stroke Neg Hx   . Colitis Maternal Aunt   . Diabetes Mother   . Diabetes Father   . Diabetes      many family members   History  Substance Use Topics  . Smoking status: Former Smoker -- 0.50 packs/day for 20  years    Types: Cigarettes    Quit date: 05/26/2013  . Smokeless tobacco: Never Used  . Alcohol Use: No   OB History   Grav Para Term Preterm Abortions TAB SAB Ect Mult Living                 Review of Systems  Constitutional: Negative for fever, chills and fatigue.  HENT: Negative for congestion and sore throat.   Respiratory: Negative for chest tightness and shortness of breath.   Cardiovascular: Negative for chest pain.  Gastrointestinal: Positive for nausea, vomiting and abdominal pain. Negative for diarrhea, constipation, blood in stool, melena, hematochezia, abdominal distention, flatus and hematemesis.  Genitourinary: Negative for dysuria, urgency, frequency, hematuria, flank pain, decreased urine volume, vaginal bleeding, vaginal discharge and pelvic pain.  Musculoskeletal: Negative for arthralgias and myalgias.  Skin: Negative for color change.  Neurological: Negative for dizziness, syncope, weakness, light-headedness, numbness and headaches.  Psychiatric/Behavioral: Negative for confusion.   10 Systems reviewed and are negative for acute change except as noted in the HPI.    Allergies  Amoxicillin; Penicillins; Latex; and Morphine and related  Home Medications   Prior to Admission medications   Medication Sig Start Date End Date Taking? Authorizing Provider  B COMPLEX VITAMINS SL Place 1 mL under the tongue 3 (three) times daily.   Yes Historical Provider, MD  cholecalciferol (VITAMIN D) 1000 UNITS tablet Take 5,000 Units by mouth daily.   Yes Historical Provider, MD  cyclobenzaprine (FLEXERIL) 5 MG tablet Take 5 mg by mouth 3 (three) times daily as needed for muscle spasms.   Yes Historical Provider, MD  HYDROmorphone (DILAUDID) 4 MG tablet Take 1 tablet (4 mg total) by mouth every 4 (four) hours as needed for severe pain. 04/02/14  Yes Renae FickleMackenzie Short, MD  hydrOXYzine (ATARAX/VISTARIL) 50 MG tablet Take 50 mg by mouth every 6 (six) hours as needed for anxiety.   Yes  Historical Provider, MD  lipase/protease/amylase (CREON-12/PANCREASE) 12000 UNITS CPEP capsule Take 2 capsules by mouth 3 (three) times daily with meals. 07/31/13  Yes Catarina Hartshornavid Tat, MD  naphazoline (NAPHCON) 0.1 % ophthalmic solution Place 1 drop into both eyes 4 (four) times daily -  with meals and at bedtime. 02/23/14  Yes Evlyn KannerPamela S Love, PA-C  Oxycodone HCl 10 MG TABS Take 10 mg by mouth every 6 (six) hours as needed (severe pain).    Yes Historical Provider, MD  pantoprazole (PROTONIX) 40 MG tablet Take 40 mg by mouth daily.   Yes Historical Provider, MD  promethazine (PHENERGAN) 25 MG tablet Take 1 tablet (25 mg total) by mouth every 6 (six) hours as needed for nausea or vomiting. 04/02/14  Yes Renae FickleMackenzie Short, MD  Triamcinolone Acetonide (TRIAMCINOLONE 0.1 % CREAM : EUCERIN) CREA Apply 1 application topically 3 (three) times daily. 02/23/14  Yes Pamela S Love, PA-C   BP 142/91  Pulse 108  Temp(Src) 97.9 F (36.6 C) (Oral)  Resp 16  SpO2 100%  LMP 01/30/2014 Physical Exam  Nursing note and vitals reviewed. Constitutional: She is oriented to person, place, and time. She appears well-developed and well-nourished. No distress.  Afebrile, nontoxic, rocking back and forth but in NAD. Tachycardia noted  HENT:  Head: Normocephalic and atraumatic.  Mouth/Throat: Oropharynx is clear and moist and mucous membranes are normal.  Eyes: Conjunctivae and EOM are normal. Right eye exhibits no discharge. Left eye exhibits no discharge.  Neck: Normal range of motion. Neck supple.  Cardiovascular: Regular rhythm, normal heart sounds and intact distal pulses.  Tachycardia present.  Exam reveals no gallop and no friction rub.   No murmur heard. Tachycardic   Pulmonary/Chest: Effort normal and breath sounds normal. No respiratory distress. She has no decreased breath sounds. She has no wheezes. She has no rhonchi. She has no rales.  Abdominal: Soft. Normal appearance and bowel sounds are normal. She exhibits no  distension. There is tenderness in the epigastric area. There is no rigidity, no rebound, no guarding, no CVA tenderness and no tenderness at McBurney's point.    Soft, obese, nondistended, +BS throughout, TTP in epigastrum with no r/g/r, neg mcburney's, no CVA TTP  Musculoskeletal: Normal range of motion.  Neurological: She is alert and oriented to person, place, and time.  Skin: Skin is warm, dry and intact. No rash noted.  Psychiatric: She has a normal mood and affect.    ED Course  Procedures (including critical care time) Labs Review Labs Reviewed  CBC WITH DIFFERENTIAL - Abnormal; Notable for the following:    Hemoglobin 11.9 (*)    HCT 35.8 (*)    All other components within normal limits  COMPREHENSIVE METABOLIC PANEL - Abnormal; Notable for the following:    Glucose, Bld 106 (*)    Total Bilirubin <0.2 (*)    All other components within normal limits  URINALYSIS, ROUTINE W REFLEX MICROSCOPIC - Abnormal; Notable for the following:    Leukocytes, UA TRACE (*)    All other components within normal limits  LIPASE, BLOOD  URINE MICROSCOPIC-ADD ON  POC URINE PREG, ED    Imaging Review Ct Abdomen Pelvis W Contrast  06/15/2014   CLINICAL DATA:  Epigastric pain and nausea and vomiting.  EXAM: CT ABDOMEN AND PELVIS WITH CONTRAST  TECHNIQUE: Multidetector CT imaging of the abdomen and pelvis was performed using the standard protocol following bolus administration of intravenous contrast.  CONTRAST:  OMNIPAQUE IOHEXOL 300 MG/ML  SOLN  COMPARISON:  04/01/2014  FINDINGS: BODY WALL: Multiple fat containing upper abdominal wall hernias are present, with at least 3 separate necks. These are in the midline and may be incisional. No fat infiltration.  LOWER CHEST: Unremarkable.  ABDOMEN/PELVIS:  Liver: Probable diffuse fatty infiltration.  Biliary: Cholecystectomy.  Pancreas: Multiloculated cystic structure within the tail of the pancreas is slightly decreased compared to prior, now 30 x  25 mm (previously 45 x 32). There is no inflammatory changes or ductal dilatation. Reportedly there is history pancreatitis and this lesion is favored to represent pseudocyst. Recent contraction also supports pseudocyst.  Spleen: Unremarkable.  Adrenals: 16 mm nodule in the left adrenal gland.  Kidneys and ureters: No hydronephrosis or stone.  Bladder: Unremarkable.  Reproductive: Unremarkable.  Bowel: Enteroenterostomy noted. No evidence of bowel obstruction or inflammation. Normal appendix.  Retroperitoneum: Chronic enlargement of pelvic sidewall lymph nodes.  Peritoneum: No ascites or pneumoperitoneum.  Vascular: No acute abnormality.  OSSEOUS: No acute abnormalities.  IMPRESSION: 1. No acute intra-abdominal findings. 2. Contracting multi locular cyst in the pancreatic tail. 3. Additional chronic findings are noted above.   Electronically Signed   By: Tiburcio Pea M.D.   On: 06/15/2014 00:59     EKG Interpretation None      MDM   Final diagnoses:  Abdominal pain  Non-intractable vomiting with nausea, vomiting of unspecified type  Pancreatic pseudocyst    37y/o female with hx of pancreatitis, here with abd pain and n/v, similar to prior episodes of pancreatitis. Will obtain labs, u/a, and CT. Could be GERD vs gastritis vs PUD given that it started after eating. Will reassess after labs return. Will give fluids, dilaudid, and phenergan now.   1:26 AM Upreg neg. CBC w/diff showing baseline anemia. CMP WNL. Lipase WNL. U/A with trace leuks but 0-2 WBC and rare bacteria, doubt UTI. CT showing stable pancreatic pseudocyst which is slightly smaller than before. Pt's pain controlled with dilaudid, will give one more dose prior to discharge since pain is returning slightly. Nausea improved after phenergan and zofran. PO challenged and pt tolerating fluids. Discussed pain control with percocet/naprosyn, phenergan rx given, and discussed clear liquids x1-2 days to help relieve her abd pain, but does  not seem to be acute pancreatitis. Could still be gastritis or PUD. 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 I explained the diagnosis and have given explicit precautions to return to the ER including for any other new or worsening symptoms. The patient understands and accepts the medical plan as it's been dictated and I have answered their questions. Discharge instructions concerning home care and prescriptions have been given. The patient is STABLE and is discharged to home in good condition.  BP 142/91  Pulse 108  Temp(Src) 97.9 F (36.6 C) (Oral)  Resp 16  SpO2 100%  LMP 01/30/2014  Meds ordered this encounter  Medications  . sodium chloride 0.9 % bolus 1,000 mL    Sig:   . HYDROmorphone (DILAUDID) injection 1 mg    Sig:   . promethazine (PHENERGAN) injection 25 mg    Sig:   . iohexol (OMNIPAQUE) 300 MG/ML solution 100 mL    Sig:   . HYDROmorphone (DILAUDID) injection 1 mg    Sig:   . ondansetron (ZOFRAN) injection 4 mg    Sig:   . oxyCODONE-acetaminophen (PERCOCET) 5-325 MG per tablet    Sig: Take 1-2 tablets by mouth every 6 (six) hours as needed for severe pain.    Dispense:  10 tablet    Refill:  0    Order Specific Question:  Supervising Provider    Answer:  Eber Hong D [[id number]]  . naproxen (NAPROSYN) 500 MG tablet    Sig: Take 1 tablet (500 mg total) by mouth 2 (two) times daily as needed for mild pain, moderate pain or headache (TAKE WITH MEALS.).    Dispense:  20 tablet    Refill:  0    Order Specific Question:  Supervising Provider    Answer:  Eber Hong D [[id number]]  . promethazine (PHENERGAN) 25 MG tablet    Sig: Take 1 tablet (25 mg total) by mouth every 6 (six) hours as needed for nausea or vomiting.    Dispense:  10 tablet    Refill:  0    Order Specific Question:  Supervising Provider    Answer:  Eber Hong D [[id number]]  Donnita [place] Camprubi-Soms, PA-C 06/15/14  0130

## 2014-06-14 NOTE — ED Notes (Signed)
Pt states she has a hx of pancreatitis and is having all the sxs of an attack  Pt states she is having abd pain and has had vomiting

## 2014-06-15 LAB — COMPREHENSIVE METABOLIC PANEL
ALBUMIN: 3.9 g/dL (ref 3.5–5.2)
ALT: 33 U/L (ref 0–35)
ANION GAP: 14 (ref 5–15)
AST: 28 U/L (ref 0–37)
Alkaline Phosphatase: 101 U/L (ref 39–117)
BUN: 6 mg/dL (ref 6–23)
CO2: 22 mEq/L (ref 19–32)
Calcium: 8.8 mg/dL (ref 8.4–10.5)
Chloride: 101 mEq/L (ref 96–112)
Creatinine, Ser: 0.53 mg/dL (ref 0.50–1.10)
GFR calc Af Amer: >90 mL/min (ref >90)
GFR calc non Af Amer: >90 mL/min (ref >90)
Glucose, Bld: 106 mg/dL — ABNORMAL HIGH (ref 70–99)
POTASSIUM: 3.9 meq/L (ref 3.7–5.3)
SODIUM: 137 meq/L (ref 137–147)
TOTAL PROTEIN: 8.3 g/dL (ref 6.0–8.3)
Total Bilirubin: <0.2 mg/dL — ABNORMAL LOW (ref 0.3–1.2)

## 2014-06-15 LAB — LIPASE, BLOOD: LIPASE: 19 U/L (ref 11–59)

## 2014-06-15 MED ORDER — PROMETHAZINE HCL 25 MG PO TABS: 25.0000 mg | ORAL_TABLET | Freq: Four times a day (QID) | ORAL | Status: DC | PRN

## 2014-06-15 MED ORDER — HYDROMORPHONE HCL 1 MG/ML IJ SOLN
1.0000 mg | Freq: Once | INTRAMUSCULAR | Status: AC
  Administered 2014-06-15: 1 mg via INTRAVENOUS
  Filled 2014-06-15: qty 1

## 2014-06-15 MED ORDER — NAPROXEN 500 MG PO TABS: 500.0000 mg | ORAL_TABLET | Freq: Two times a day (BID) | ORAL | Status: DC | PRN

## 2014-06-15 MED ORDER — ONDANSETRON HCL 4 MG/2ML IJ SOLN
4.0000 mg | Freq: Once | INTRAMUSCULAR | Status: AC
  Administered 2014-06-15: 4 mg via INTRAVENOUS
  Filled 2014-06-15: qty 2

## 2014-06-15 MED ORDER — OXYCODONE-ACETAMINOPHEN 5-325 MG PO TABS
1.0000 | ORAL_TABLET | Freq: Four times a day (QID) | ORAL | Status: DC | PRN
Start: 2014-06-15 — End: 2014-08-22

## 2014-06-15 NOTE — ED Notes (Signed)
Pt able to tolerate PO challenge

## 2014-06-15 NOTE — ED Provider Notes (Signed)
Medical screening examination/treatment/procedure(s) were performed by non-physician practitioner and as supervising physician I was immediately available for consultation/collaboration.   EKG Interpretation None        Elwin Mocha, MD 06/15/14 2302

## 2014-06-15 NOTE — Discharge Instructions (Signed)
Your abdominal pain does not seem to be acute pancreatitis. Stay well hydrated and consume clear liquids for the next day or two until your abdominal pain improves, then advance your diet to soft bland foods. Avoid any spicy or irritating foods. Use phenergan as directed for nausea/vomiting. Use naprosyn and percocet as directed, as needed for pain, but don't drive or operate machinery while taking percocet. Call your pancreas specialist tomorrow for re-evaluation in 3-5 days. Return to the ER for any changes or worsening symptoms.   Abdominal Pain Many things can cause belly (abdominal) pain. Most times, the belly pain is not dangerous. Many cases of belly pain can be watched and treated at home. HOME CARE   Do not take medicines that help you go poop (laxatives) unless told to by your doctor.  Only take medicine as told by your doctor.  Eat or drink as told by your doctor. Your doctor will tell you if you should be on a special diet. GET HELP IF:  You do not know what is causing your belly pain.  You have belly pain while you are sick to your stomach (nauseous) or have runny poop (diarrhea).  You have pain while you pee or poop.  Your belly pain wakes you up at night.  You have belly pain that gets worse or better when you eat.  You have belly pain that gets worse when you eat fatty foods.  You have a fever. GET HELP RIGHT AWAY IF:   The pain does not go away within 2 hours.  You keep throwing up (vomiting).  The pain changes and is only in the right or left part of the belly.  You have bloody or tarry looking poop. MAKE SURE YOU:   Understand these instructions.  Will watch your condition.  Will get help right away if you are not doing well or get worse. Document Released: 01/21/2008 Document Revised: 08/09/2013 Document Reviewed: 04/13/2013 [facility] Patient Information 2015 [place], [place]. This information is not intended to replace advice given to you by your health  care provider. Make sure you discuss any questions you have with your health care provider.  Nausea and Vomiting Nausea means you feel sick to your stomach. Throwing up (vomiting) is a reflex where stomach contents come out of your mouth. HOME CARE   Take medicine as told by your doctor.  Do not force yourself to eat. However, you do need to drink fluids.  If you feel like eating, eat a normal diet as told by your doctor.  Eat rice, wheat, potatoes, bread, lean meats, yogurt, fruits, and vegetables.  Avoid high-fat foods.  Drink enough fluids to keep your pee (urine) clear or pale yellow.  Ask your doctor how to replace body fluid losses (rehydrate). Signs of body fluid loss (dehydration) include:  Feeling very thirsty.  Dry lips and mouth.  Feeling dizzy.  Dark pee.  Peeing less than normal.  Feeling confused.  Fast breathing or heart rate. GET HELP RIGHT AWAY IF:   You have blood in your throw up.  You have black or bloody poop (stool).  You have a bad headache or stiff neck.  You feel confused.  You have bad belly (abdominal) pain.  You have chest pain or trouble breathing.  You do not pee at least once every 8 hours.  You have cold, clammy skin.  You keep throwing up after 24 to 48 hours.  You have a fever. MAKE SURE YOU:   Understand these  instructions.  Will watch your condition.  Will get help right away if you are not doing well or get worse. Document Released: 01/21/2008 Document Revised: 10/27/2011 Document Reviewed: 01/03/2011 [facility] Patient Information 2015 [place], [place]. This information is not intended to replace advice given to you by your health care provider. Make sure you discuss any questions you have with your health care provider.

## 2014-07-24 ENCOUNTER — Other Ambulatory Visit: Payer: Self-pay | Admitting: Physical Medicine and Rehabilitation

## 2014-08-07 ENCOUNTER — Emergency Department (HOSPITAL_COMMUNITY)
Admission: EM | Admit: 2014-08-07 | Discharge: 2014-08-07 | Disposition: A | Discharge status: Home / Self Care | Payer: Medicaid Other | Attending: Emergency Medicine | Admitting: Emergency Medicine

## 2014-08-07 ENCOUNTER — Encounter (HOSPITAL_COMMUNITY): Payer: Self-pay | Admitting: Emergency Medicine

## 2014-08-07 DIAGNOSIS — Z8659 Personal history of other mental and behavioral disorders: Secondary | ICD-10-CM | POA: Insufficient documentation

## 2014-08-07 DIAGNOSIS — R110 Nausea: Secondary | ICD-10-CM | POA: Diagnosis not present

## 2014-08-07 DIAGNOSIS — Z9049 Acquired absence of other specified parts of digestive tract: Secondary | ICD-10-CM | POA: Insufficient documentation

## 2014-08-07 DIAGNOSIS — G8929 Other chronic pain: Secondary | ICD-10-CM | POA: Insufficient documentation

## 2014-08-07 DIAGNOSIS — R1013 Epigastric pain: Secondary | ICD-10-CM | POA: Insufficient documentation

## 2014-08-07 DIAGNOSIS — Z91040 Latex allergy status: Secondary | ICD-10-CM | POA: Diagnosis not present

## 2014-08-07 DIAGNOSIS — E2740 Unspecified adrenocortical insufficiency: Secondary | ICD-10-CM | POA: Diagnosis not present

## 2014-08-07 DIAGNOSIS — Z880 Allergy status to penicillin: Secondary | ICD-10-CM | POA: Insufficient documentation

## 2014-08-07 DIAGNOSIS — Z3202 Encounter for pregnancy test, result negative: Secondary | ICD-10-CM | POA: Diagnosis not present

## 2014-08-07 DIAGNOSIS — R630 Anorexia: Secondary | ICD-10-CM | POA: Diagnosis not present

## 2014-08-07 DIAGNOSIS — Z87891 Personal history of nicotine dependence: Secondary | ICD-10-CM | POA: Diagnosis not present

## 2014-08-07 DIAGNOSIS — Z8669 Personal history of other diseases of the nervous system and sense organs: Secondary | ICD-10-CM | POA: Diagnosis not present

## 2014-08-07 DIAGNOSIS — K219 Gastro-esophageal reflux disease without esophagitis: Secondary | ICD-10-CM | POA: Insufficient documentation

## 2014-08-07 DIAGNOSIS — Z79899 Other long term (current) drug therapy: Secondary | ICD-10-CM | POA: Diagnosis not present

## 2014-08-07 DIAGNOSIS — Z9889 Other specified postprocedural states: Secondary | ICD-10-CM | POA: Insufficient documentation

## 2014-08-07 LAB — URINALYSIS, ROUTINE W REFLEX MICROSCOPIC
Bilirubin Urine: NEGATIVE
Glucose, UA: NEGATIVE mg/dL
Hgb urine dipstick: NEGATIVE
Ketones, ur: NEGATIVE mg/dL
Leukocytes, UA: NEGATIVE
NITRITE: NEGATIVE
Protein, ur: NEGATIVE mg/dL
SPECIFIC GRAVITY, URINE: 1.014 (ref 1.005–1.030)
UROBILINOGEN UA: 1 mg/dL (ref 0.0–1.0)
pH: 5.5 (ref 5.0–8.0)

## 2014-08-07 LAB — CBC
HCT: 36.8 % (ref 36.0–46.0)
Hemoglobin: 12.1 g/dL (ref 12.0–15.0)
MCH: 27.9 pg (ref 26.0–34.0)
MCHC: 32.9 g/dL (ref 30.0–36.0)
MCV: 84.8 fL (ref 78.0–100.0)
PLATELETS: 294 10*3/uL (ref 150–400)
RBC: 4.34 MIL/uL (ref 3.87–5.11)
RDW: 14.6 % (ref 11.5–15.5)
WBC: 7.2 10*3/uL (ref 4.0–10.5)

## 2014-08-07 LAB — COMPREHENSIVE METABOLIC PANEL
ALT: 23 U/L (ref 0–35)
AST: 21 U/L (ref 0–37)
Albumin: 4 g/dL (ref 3.5–5.2)
Alkaline Phosphatase: 101 U/L (ref 39–117)
Anion gap: 14 (ref 5–15)
BUN: 8 mg/dL (ref 6–23)
CALCIUM: 9.5 mg/dL (ref 8.4–10.5)
CO2: 24 mEq/L (ref 19–32)
Chloride: 102 mEq/L (ref 96–112)
Creatinine, Ser: 0.64 mg/dL (ref 0.50–1.10)
GFR calc non Af Amer: >90 mL/min (ref >90)
GLUCOSE: 93 mg/dL (ref 70–99)
Potassium: 3.6 mEq/L — ABNORMAL LOW (ref 3.7–5.3)
SODIUM: 140 meq/L (ref 137–147)
TOTAL PROTEIN: 8.4 g/dL — AB (ref 6.0–8.3)
Total Bilirubin: 0.5 mg/dL (ref 0.3–1.2)

## 2014-08-07 LAB — PREGNANCY, URINE: PREG TEST UR: NEGATIVE

## 2014-08-07 LAB — LIPASE, BLOOD: Lipase: 12 U/L (ref 11–59)

## 2014-08-07 MED ORDER — HYDROMORPHONE HCL 1 MG/ML IJ SOLN
1.0000 mg | Freq: Once | INTRAMUSCULAR | Status: AC
  Administered 2014-08-07: 1 mg via INTRAVENOUS
  Filled 2014-08-07: qty 1

## 2014-08-07 MED ORDER — GI COCKTAIL ~~LOC~~
30.0000 mL | Freq: Once | ORAL | Status: AC
  Administered 2014-08-07: 30 mL via ORAL
  Filled 2014-08-07: qty 30

## 2014-08-07 MED ORDER — HYDROMORPHONE HCL 2 MG/ML IJ SOLN
2.0000 mg | Freq: Once | INTRAMUSCULAR | Status: AC
  Administered 2014-08-07: 2 mg via INTRAVENOUS
  Filled 2014-08-07: qty 1

## 2014-08-07 MED ORDER — SODIUM CHLORIDE 0.9 % IV BOLUS (SEPSIS)
1000.0000 mL | Freq: Once | INTRAVENOUS | Status: AC
  Administered 2014-08-07: 1000 mL via INTRAVENOUS

## 2014-08-07 MED ORDER — ONDANSETRON HCL 4 MG/2ML IJ SOLN
4.0000 mg | Freq: Once | INTRAMUSCULAR | Status: AC
  Administered 2014-08-07: 4 mg via INTRAVENOUS
  Filled 2014-08-07: qty 2

## 2014-08-07 MED ORDER — PANTOPRAZOLE SODIUM 40 MG PO TBEC: 40.0000 mg | DELAYED_RELEASE_TABLET | Freq: Every day | ORAL | Status: DC

## 2014-08-07 NOTE — ED Notes (Signed)
Has not taken any antiemetics but has taken oxycodone but unable to keep them down.

## 2014-08-07 NOTE — ED Notes (Signed)
Pt notified we needed a sample. Pt stated she couldn't at this time, but will let us know when she is able to void.

## 2014-08-07 NOTE — ED Notes (Signed)
Pt sts that she has abd pain that started Saturday. Pain was controled with pain medication at home (Oxycodone). Pt now having n/v and unable to keep medications down resulting in increase in abd pain. Pt has hx of pancreatitis and thinks this is an increase in that pain. Denies any trouble with urination. Pt does have some "loose" stools.

## 2014-08-07 NOTE — ED Notes (Signed)
MD at bedside. [place]  

## 2014-08-07 NOTE — ED Notes (Signed)
Pain Increased MD notified and at Bedside

## 2014-08-07 NOTE — Discharge Instructions (Signed)

## 2014-08-07 NOTE — ED Provider Notes (Signed)
CSN: [id number]     Arrival date & time 08/07/14  1120 History   First MD Initiated Contact with Patient 08/07/14 1225     Chief Complaint  Patient presents with  . Abdominal Pain     (Consider location/radiation/quality/duration/timing/severity/associated sxs/prior Treatment) Patient is a 38 y.o. female presenting with abdominal pain.  Abdominal Pain Pain location:  Epigastric Pain quality: aching and sharp   Pain radiates to:  Back and R flank Pain severity:  Severe Onset quality:  Gradual Duration:  4 days Timing:  Constant Progression:  Unchanged Chronicity:  New Context comment:  Prior pancreatits, symptoms feel similar Relieved by:  Nothing Worsened by:  Palpation and eating Associated symptoms: anorexia and nausea   Associated symptoms: no constipation, no diarrhea, no fever and no vomiting     Past Medical History  Diagnosis Date  . Hernia 03-24-13    ventral hernia remains   . GERD (gastroesophageal reflux disease)   . Pancreatitis 03-24-13    2002, 2'2013, 03-14-13  . Pancreatic cyst 2002 onset  . Anxiety   . Depression     "recent breakup with partner of 15 yrs"  . Adrenal insufficiency 04/23/2013    ??  . MS (multiple sclerosis)    Past Surgical History  Procedure Laterality Date  . Abdominal surgery  ~ 2007    some sort of pancreatic cyst drainage.   . Cholecystectomy      ~ age 88-laparoscopic  . Adenoidectomy    . 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 Eus N/A 04/14/2013    Procedure: UPPER ENDOSCOPIC ULTRASOUND (EUS) LINEAR;  Surgeon: Rachael Fee, MD;  Location: [facility];  Service: Endoscopy;  Laterality: N/A;   Family History  Problem Relation Age of Onset  . Lupus Neg Hx   . Sarcoidosis Neg Hx   . Pancreatitis Neg Hx   . Ataxia Neg Hx   . Chorea Neg Hx   . Dementia Neg Hx   . Mental retardation Neg Hx   . Migraines Neg Hx   . Multiple sclerosis Neg Hx   . Neurofibromatosis Neg Hx    . Neuropathy Neg Hx   . Parkinsonism Neg Hx   . Seizures Neg Hx   . Stroke Neg Hx   . Colitis Maternal Aunt   . Diabetes Mother   . Diabetes Father   . Diabetes      many family members   History  Substance Use Topics  . Smoking status: Former Smoker -- 0.50 packs/day for 20 years    Types: Cigarettes    Quit date: 05/26/2013  . Smokeless tobacco: Never Used  . Alcohol Use: No   OB History    No data available     Review of Systems  Constitutional: Negative for fever.  Gastrointestinal: Positive for nausea, abdominal pain and anorexia. Negative for vomiting, diarrhea and constipation.  All other systems reviewed and are negative.     Allergies  Amoxicillin; Penicillins; Latex; and Morphine and related  Home Medications   Prior to Admission medications   Medication Sig Start Date End Date Taking? Authorizing Provider  B COMPLEX VITAMINS SL Place 1 mL under the tongue 3 (three) times daily.   Yes Historical Provider, MD  cholecalciferol (VITAMIN D) 1000 UNITS tablet Take 5,000 Units by mouth daily.   Yes Historical Provider, MD  cyclobenzaprine (FLEXERIL) 5 MG tablet Take 5 mg by mouth 3 (three) times  daily as needed for muscle spasms.   Yes Historical Provider, MD  HYDROmorphone (DILAUDID) 4 MG tablet Take 1 tablet (4 mg total) by mouth every 4 (four) hours as needed for severe pain. 04/02/14  Yes Renae Fickle, MD  hydrOXYzine (ATARAX/VISTARIL) 50 MG tablet Take 50 mg by mouth every 6 (six) hours as needed for anxiety.   Yes Historical Provider, MD  ibuprofen (ADVIL,MOTRIN) 200 MG tablet Take 600 mg by mouth every 6 (six) hours as needed (For pain.).   Yes Historical Provider, MD  lipase/protease/amylase (CREON-12/PANCREASE) 12000 UNITS CPEP capsule Take 2 capsules by mouth 3 (three) times daily with meals. 07/31/13  Yes Catarina Hartshorn, MD  naphazoline (NAPHCON) 0.1 % ophthalmic solution Place 1 drop into both eyes 4 (four) times daily -  with meals and at bedtime. Patient  taking differently: Place 1 drop into both eyes 3 (three) times daily as needed (For dry eyes.).  02/23/14  Yes Evlyn Kanner Love, PA-C  ondansetron (ZOFRAN-ODT) 4 MG disintegrating tablet Take 4 mg by mouth every 8 (eight) hours as needed for nausea or vomiting.  04/28/13  Yes Historical Provider, MD  Oxycodone HCl 10 MG TABS Take 10 mg by mouth every 6 (six) hours as needed (severe pain).    Yes Historical Provider, MD  promethazine (PHENERGAN) 25 MG tablet Take 1 tablet (25 mg total) by mouth every 6 (six) hours as needed for nausea or vomiting. 04/02/14  Yes Renae Fickle, MD  Triamcinolone Acetonide (TRIAMCINOLONE 0.1 % CREAM : EUCERIN) CREA Apply 1 application topically 3 (three) times daily. Patient taking differently: Apply 1 application topically 3 (three) times daily as needed (For psoriasis.).  02/23/14  Yes Evlyn Kanner Love, PA-C  naproxen (NAPROSYN) 500 MG tablet Take 1 tablet (500 mg total) by mouth 2 (two) times daily as needed for mild pain, moderate pain or headache (TAKE WITH MEALS.). Patient not taking: Reported on 08/07/2014 06/15/14   Donnita Falls Camprubi-Soms, PA-C  oxyCODONE-acetaminophen (PERCOCET) 5-325 MG per tablet Take 1-2 tablets by mouth every 6 (six) hours as needed for severe pain. Patient not taking: Reported on 08/07/2014 06/15/14   Donnita Falls Camprubi-Soms, PA-C  pantoprazole (PROTONIX) 40 MG tablet Take 1 tablet (40 mg total) by mouth daily. 08/07/14   Mirian Mo, MD  promethazine (PHENERGAN) 25 MG tablet Take 1 tablet (25 mg total) by mouth every 6 (six) hours as needed for nausea or vomiting. 06/15/14   Mercedes Strupp Camprubi-Soms, PA-C   BP 111/58 mmHg  Pulse 66  Temp(Src) 98 F (36.7 C) (Oral)  Resp 18  SpO2 96% Physical Exam  Constitutional: She is oriented to person, place, and time. She appears well-developed and well-nourished.  HENT:  Head: Normocephalic and atraumatic.  Right Ear: External ear normal.  Left Ear: External ear normal.  Eyes:  Conjunctivae and EOM are normal. Pupils are equal, round, and reactive to light.  Neck: Normal range of motion. Neck supple.  Cardiovascular: Normal rate, regular rhythm, normal heart sounds and intact distal pulses.   Pulmonary/Chest: Effort normal and breath sounds normal.  Abdominal: Soft. Bowel sounds are normal. There is tenderness in the epigastric area.  Musculoskeletal: Normal range of motion.  Neurological: She is alert and oriented to person, place, and time.  Skin: Skin is warm and dry.  Vitals reviewed.   ED Course  Procedures (including critical care time) Labs Review Labs Reviewed  URINALYSIS, ROUTINE W REFLEX MICROSCOPIC - Abnormal; Notable for the following:    APPearance CLOUDY (*)  All other components within normal limits  COMPREHENSIVE METABOLIC PANEL - Abnormal; Notable for the following:    Potassium 3.6 (*)    Total Protein 8.4 (*)    All other components within normal limits  PREGNANCY, URINE  LIPASE, BLOOD  CBC    Imaging Review No results found.   EKG Interpretation None      MDM   Final diagnoses:  Epigastric pain    38 y.o. female with pertinent PMH of pancreatitis presents with recurrent symptoms of same.  Physical exam and vitals as above on arrival.  Pain constant, no hematuria, and she states she has had nephrolithiasis in the past and this does not feel similar.  Labs and UA as above unremarkable for patient.  Likely etiology recurrent pancreatitis.  Symptoms relieved with 2 rounds dilaudid.  DC home in stable condition with standard return precautions.  I have reviewed all laboratory and imaging studies if ordered as above  1. Epigastric pain         Mirian MoMatthew Welborn Keena, MD 08/08/14 [id number]

## 2014-08-10 ENCOUNTER — Emergency Department (HOSPITAL_COMMUNITY)
Admission: EM | Admit: 2014-08-10 | Discharge: 2014-08-10 | Disposition: A | Discharge status: Home / Self Care | Payer: Medicaid Other | Attending: Emergency Medicine | Admitting: Emergency Medicine

## 2014-08-10 ENCOUNTER — Encounter (HOSPITAL_COMMUNITY): Payer: Self-pay

## 2014-08-10 DIAGNOSIS — F419 Anxiety disorder, unspecified: Secondary | ICD-10-CM | POA: Insufficient documentation

## 2014-08-10 DIAGNOSIS — Z9049 Acquired absence of other specified parts of digestive tract: Secondary | ICD-10-CM | POA: Diagnosis not present

## 2014-08-10 DIAGNOSIS — Z9889 Other specified postprocedural states: Secondary | ICD-10-CM | POA: Insufficient documentation

## 2014-08-10 DIAGNOSIS — Z880 Allergy status to penicillin: Secondary | ICD-10-CM | POA: Insufficient documentation

## 2014-08-10 DIAGNOSIS — K219 Gastro-esophageal reflux disease without esophagitis: Secondary | ICD-10-CM | POA: Insufficient documentation

## 2014-08-10 DIAGNOSIS — Z91040 Latex allergy status: Secondary | ICD-10-CM | POA: Diagnosis not present

## 2014-08-10 DIAGNOSIS — R1010 Upper abdominal pain, unspecified: Secondary | ICD-10-CM | POA: Diagnosis present

## 2014-08-10 DIAGNOSIS — R112 Nausea with vomiting, unspecified: Secondary | ICD-10-CM | POA: Diagnosis not present

## 2014-08-10 DIAGNOSIS — Z87891 Personal history of nicotine dependence: Secondary | ICD-10-CM | POA: Insufficient documentation

## 2014-08-10 DIAGNOSIS — G8929 Other chronic pain: Secondary | ICD-10-CM | POA: Diagnosis not present

## 2014-08-10 DIAGNOSIS — F329 Major depressive disorder, single episode, unspecified: Secondary | ICD-10-CM | POA: Insufficient documentation

## 2014-08-10 DIAGNOSIS — R1013 Epigastric pain: Secondary | ICD-10-CM | POA: Diagnosis not present

## 2014-08-10 DIAGNOSIS — Z3202 Encounter for pregnancy test, result negative: Secondary | ICD-10-CM | POA: Insufficient documentation

## 2014-08-10 LAB — CBC WITH DIFFERENTIAL/PLATELET
BASOS PCT: 0 % (ref 0–1)
Basophils Absolute: 0 10*3/uL (ref 0.0–0.1)
Eosinophils Absolute: 0.2 10*3/uL (ref 0.0–0.7)
Eosinophils Relative: 3 % (ref 0–5)
HCT: 33.9 % — ABNORMAL LOW (ref 36.0–46.0)
HEMOGLOBIN: 11.3 g/dL — AB (ref 12.0–15.0)
LYMPHS ABS: 1.4 10*3/uL (ref 0.7–4.0)
LYMPHS PCT: 18 % (ref 12–46)
MCH: 28.5 pg (ref 26.0–34.0)
MCHC: 33.3 g/dL (ref 30.0–36.0)
MCV: 85.4 fL (ref 78.0–100.0)
MONO ABS: 0.7 10*3/uL (ref 0.1–1.0)
MONOS PCT: 9 % (ref 3–12)
NEUTROS ABS: 5.3 10*3/uL (ref 1.7–7.7)
Neutrophils Relative %: 69 % (ref 43–77)
Platelets: 255 10*3/uL (ref 150–400)
RBC: 3.97 MIL/uL (ref 3.87–5.11)
RDW: 14.6 % (ref 11.5–15.5)
WBC: 7.7 10*3/uL (ref 4.0–10.5)

## 2014-08-10 LAB — COMPREHENSIVE METABOLIC PANEL
ALT: 24 U/L (ref 0–35)
ANION GAP: 7 (ref 5–15)
AST: 24 U/L (ref 0–37)
Albumin: 3.8 g/dL (ref 3.5–5.2)
Alkaline Phosphatase: 74 U/L (ref 39–117)
BUN: 15 mg/dL (ref 6–23)
CO2: 23 mmol/L (ref 19–32)
CREATININE: 0.48 mg/dL — AB (ref 0.50–1.10)
Calcium: 8.6 mg/dL (ref 8.4–10.5)
Chloride: 110 mEq/L (ref 96–112)
GLUCOSE: 86 mg/dL (ref 70–99)
POTASSIUM: 4 mmol/L (ref 3.5–5.1)
Sodium: 140 mmol/L (ref 135–145)
Total Bilirubin: 0.7 mg/dL (ref 0.3–1.2)
Total Protein: 7.5 g/dL (ref 6.0–8.3)

## 2014-08-10 LAB — URINALYSIS, ROUTINE W REFLEX MICROSCOPIC
BILIRUBIN URINE: NEGATIVE
Glucose, UA: NEGATIVE mg/dL
Hgb urine dipstick: NEGATIVE
KETONES UR: NEGATIVE mg/dL
Leukocytes, UA: NEGATIVE
NITRITE: NEGATIVE
Protein, ur: NEGATIVE mg/dL
Specific Gravity, Urine: 1.026 (ref 1.005–1.030)
UROBILINOGEN UA: 1 mg/dL (ref 0.0–1.0)
pH: 6 (ref 5.0–8.0)

## 2014-08-10 LAB — POC URINE PREG, ED: Preg Test, Ur: NEGATIVE

## 2014-08-10 LAB — LIPASE, BLOOD: LIPASE: 10 U/L — AB (ref 11–59)

## 2014-08-10 MED ORDER — HYDROCODONE-ACETAMINOPHEN 5-325 MG PO TABS: 2.0000 | ORAL_TABLET | ORAL | Status: DC | PRN

## 2014-08-10 MED ORDER — HYDROMORPHONE HCL 1 MG/ML IJ SOLN
1.0000 mg | Freq: Once | INTRAMUSCULAR | Status: AC
  Administered 2014-08-10: 1 mg via INTRAVENOUS
  Filled 2014-08-10: qty 1

## 2014-08-10 MED ORDER — SODIUM CHLORIDE 0.9 % IV BOLUS (SEPSIS)
1000.0000 mL | Freq: Once | INTRAVENOUS | Status: AC
  Administered 2014-08-10: 1000 mL via INTRAVENOUS

## 2014-08-10 MED ORDER — ONDANSETRON HCL 4 MG/2ML IJ SOLN
4.0000 mg | Freq: Once | INTRAMUSCULAR | Status: AC
  Administered 2014-08-10: 4 mg via INTRAVENOUS
  Filled 2014-08-10: qty 2

## 2014-08-10 MED ORDER — ONDANSETRON 4 MG PO TBDP: ORAL_TABLET | ORAL | Status: DC

## 2014-08-10 MED ORDER — NAPROXEN 500 MG PO TABS: 500.0000 mg | ORAL_TABLET | Freq: Two times a day (BID) | ORAL | Status: DC | PRN

## 2014-08-10 MED ORDER — SODIUM CHLORIDE 0.9 % IV BOLUS (SEPSIS): 500.0000 mL | Freq: Once | INTRAVENOUS | Status: DC

## 2014-08-10 NOTE — ED Notes (Signed)
Attempted IV start x2 and was unsuccessful. Alaina, CN made aware and reported she would attempt as well as put in a consult to IV team.

## 2014-08-10 NOTE — ED Notes (Signed)
Per phlebetomy, "patient refusing to be stuck for labs until an ultrasound IV is in place."

## 2014-08-10 NOTE — ED Provider Notes (Signed)
CSN: [id number]     Arrival date & time 08/10/14  1106 History   First MD Initiated Contact with Patient 08/10/14 1202     Chief Complaint  Patient presents with  . Abdominal Pain  . Emesis     (Consider location/radiation/quality/duration/timing/severity/associated sxs/prior Treatment) HPI Mary [name] is a 38 y.o. female with a history of chronic pancreatitis, pancreatic cyst drainage, cholecystectomy, nephrolithiasis, comes in for evaluation of epigastric pain. Patient states her pain began on Saturday and she was unable to control it at home and presented to the ED on Monday for evaluation of her pain. Her workup was benign and pain was controlled with 2 rounds of Dilaudid. She reports her pain today is typical of her pancreatitis flares. She describes it as a sharp stabbing pain that sometimes radiates through to her back. She reports associated nausea and vomiting with stomach contents. No bilious vomiting or hematemesis. She denies any constipation or diarrhea, chest pain shortness of breath, hematuria, fevers at home. She denies any alcohol consumption states that she does not drink. Reports that she just recently was able to obtain insurance emboli, referral for a GI specialist. Past Medical History  Diagnosis Date  . Hernia 03-24-13    ventral hernia remains   . GERD (gastroesophageal reflux disease)   . Pancreatitis 03-24-13    2002, 2'2013, 03-14-13  . Pancreatic cyst 2002 onset  . Anxiety   . Depression     "recent breakup with partner of 15 yrs"  . Adrenal insufficiency 04/23/2013    ??  . MS (multiple sclerosis)    Past Surgical History  Procedure Laterality Date  . Abdominal surgery  ~ 2007    some sort of pancreatic cyst drainage.   . Cholecystectomy      ~ age 46-laparoscopic  . Adenoidectomy    . 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 Eus N/A 04/14/2013    Procedure: UPPER ENDOSCOPIC ULTRASOUND (EUS) LINEAR;   Surgeon: Rachael Fee, MD;  Location: [facility];  Service: Endoscopy;  Laterality: N/A;   Family History  Problem Relation Age of Onset  . Lupus Neg Hx   . Sarcoidosis Neg Hx   . Pancreatitis Neg Hx   . Ataxia Neg Hx   . Chorea Neg Hx   . Dementia Neg Hx   . Mental retardation Neg Hx   . Migraines Neg Hx   . Multiple sclerosis Neg Hx   . Neurofibromatosis Neg Hx   . Neuropathy Neg Hx   . Parkinsonism Neg Hx   . Seizures Neg Hx   . Stroke Neg Hx   . Colitis Maternal Aunt   . Diabetes Mother   . Diabetes Father   . Diabetes      many family members   History  Substance Use Topics  . Smoking status: Former Smoker -- 0.50 packs/day for 20 years    Types: Cigarettes    Quit date: 05/26/2013  . Smokeless tobacco: Never Used  . Alcohol Use: No   OB History    No data available     Review of Systems  Gastrointestinal: Positive for nausea, vomiting and abdominal pain.  All other systems reviewed and are negative.     Allergies  Amoxicillin; Penicillins; Latex; and Morphine and related  Home Medications   Prior to Admission medications   Medication Sig Start Date End Date Taking? Authorizing Provider  B COMPLEX VITAMINS SL  Place 1 mL under the tongue 3 (three) times daily.   Yes Historical Provider, MD  cholecalciferol (VITAMIN D) 1000 UNITS tablet Take 5,000 Units by mouth daily.   Yes Historical Provider, MD  cyclobenzaprine (FLEXERIL) 5 MG tablet Take 5 mg by mouth 3 (three) times daily as needed for muscle spasms.   Yes Historical Provider, MD  HYDROmorphone (DILAUDID) 4 MG tablet Take 1 tablet (4 mg total) by mouth every 4 (four) hours as needed for severe pain. 04/02/14  Yes Renae Fickle, MD  hydrOXYzine (ATARAX/VISTARIL) 50 MG tablet Take 50 mg by mouth every 6 (six) hours as needed for anxiety.   Yes Historical Provider, MD  ibuprofen (ADVIL,MOTRIN) 200 MG tablet Take 600 mg by mouth every 6 (six) hours as needed (For pain.).   Yes Historical Provider, MD   lipase/protease/amylase (CREON-12/PANCREASE) 12000 UNITS CPEP capsule Take 2 capsules by mouth 3 (three) times daily with meals. 07/31/13  Yes Catarina Hartshorn, MD  naphazoline (NAPHCON) 0.1 % ophthalmic solution Place 1 drop into both eyes 4 (four) times daily -  with meals and at bedtime. Patient taking differently: Place 1 drop into both eyes 3 (three) times daily as needed (For dry eyes.).  02/23/14  Yes Evlyn Kanner Love, PA-C  Oxycodone HCl 10 MG TABS Take 10 mg by mouth every 6 (six) hours as needed (severe pain).    Yes Historical Provider, MD  pantoprazole (PROTONIX) 40 MG tablet Take 1 tablet (40 mg total) by mouth daily. 08/07/14  Yes Mirian Mo, MD  promethazine (PHENERGAN) 25 MG tablet Take 1 tablet (25 mg total) by mouth every 6 (six) hours as needed for nausea or vomiting. 06/15/14  Yes Mercedes Strupp Camprubi-Soms, PA-C  Triamcinolone Acetonide (TRIAMCINOLONE 0.1 % CREAM : EUCERIN) CREA Apply 1 application topically 3 (three) times daily. Patient taking differently: Apply 1 application topically 3 (three) times daily as needed (For psoriasis.).  02/23/14  Yes Evlyn Kanner Love, PA-C  HYDROcodone-acetaminophen (NORCO/VICODIN) 5-325 MG per tablet Take 2 tablets by mouth every 4 (four) hours as needed for moderate pain or severe pain. 08/10/14   Earle Gell Leston Schueller, PA-C  naproxen (NAPROSYN) 500 MG tablet Take 1 tablet (500 mg total) by mouth 2 (two) times daily as needed for mild pain, moderate pain or headache (TAKE WITH MEALS.). 08/10/14   Sharlene Motts, PA-C  ondansetron (ZOFRAN ODT) 4 MG disintegrating tablet 4mg  ODT q4 hours prn nausea/vomit 08/10/14   Earle Gell Nihira Puello, PA-C  oxyCODONE-acetaminophen (PERCOCET) 5-325 MG per tablet Take 1-2 tablets by mouth every 6 (six) hours as needed for severe pain. Patient not taking: Reported on 08/07/2014 06/15/14   Donnita Falls Camprubi-Soms, PA-C  promethazine (PHENERGAN) 25 MG tablet Take 1 tablet (25 mg total) by mouth every 6 (six) hours as  needed for nausea or vomiting. Patient not taking: Reported on 08/10/2014 04/02/14   Renae Fickle, MD   BP 103/63 mmHg  Pulse 57  Temp(Src) 98.1 F (36.7 C) (Oral)  Resp 12  SpO2 97% Physical Exam  Constitutional: She is oriented to person, place, and time. She appears well-developed and well-nourished.  Obese. Well appearing, nontoxic.  HENT:  Head: Normocephalic and atraumatic.  Mouth/Throat: Oropharynx is clear and moist.  Eyes: Conjunctivae are normal. Pupils are equal, round, and reactive to light. Right eye exhibits no discharge. Left eye exhibits no discharge. No scleral icterus.  Neck: Neck supple.  Cardiovascular: Normal rate, regular rhythm and normal heart sounds.   Pulmonary/Chest: Effort normal and breath sounds normal.  No respiratory distress. She has no wheezes. She has no rales.  Abdominal: Soft.  Diffuse epigastric tenderness with no obvious distention, no rebound or guarding. No obvious lesions, no pulsatile masses or deformities  Musculoskeletal: She exhibits no tenderness.  Neurological: She is alert and oriented to person, place, and time.  Cranial Nerves II-XII grossly intact  Skin: Skin is warm and dry. No rash noted.  Psychiatric: She has a normal mood and affect.  Nursing note and vitals reviewed.   ED Course  Procedures (including critical care time) Labs Review Labs Reviewed  CBC WITH DIFFERENTIAL - Abnormal; Notable for the following:    Hemoglobin 11.3 (*)    HCT 33.9 (*)    All other components within normal limits  COMPREHENSIVE METABOLIC PANEL - Abnormal; Notable for the following:    Creatinine, Ser 0.48 (*)    All other components within normal limits  LIPASE, BLOOD - Abnormal; Notable for the following:    Lipase 10 (*)    All other components within normal limits  URINALYSIS, ROUTINE W REFLEX MICROSCOPIC - Abnormal; Notable for the following:    APPearance CLOUDY (*)    All other components within normal limits  POC URINE PREG, ED     Imaging Review No results found.   EKG Interpretation None     Meds given in ED:  Medications  HYDROmorphone (DILAUDID) injection 1 mg (1 mg Intravenous Given 08/10/14 1327)  ondansetron (ZOFRAN) injection 4 mg (4 mg Intravenous Given 08/10/14 1326)  HYDROmorphone (DILAUDID) injection 1 mg (1 mg Intravenous Given 08/10/14 1408)  sodium chloride 0.9 % bolus 1,000 mL (0 mLs Intravenous Stopped 08/10/14 1529)    Discharge Medication List as of 08/10/2014  3:17 PM    START taking these medications   Details  HYDROcodone-acetaminophen (NORCO/VICODIN) 5-325 MG per tablet Take 2 tablets by mouth every 4 (four) hours as needed for moderate pain or severe pain., Starting 08/10/2014, Until Discontinued, Print       Filed Vitals:   08/10/14 1128 08/10/14 1353 08/10/14 1529  BP: 129/67 109/70 103/63  Pulse: 75 61 57  Temp: 98.4 F (36.9 C) 98.1 F (36.7 C)   TempSrc: Oral Oral   Resp: 16 19 12   SpO2: 100% 97% 97%    MDM  Eusebio MeHaley M Somera is a 38 y.o. female who presents for acute exacerbation of chronic epigastric abdominal pain. Pain has been present along with nausea and vomiting since Saturday. Patient was seen on 12/21 for same complaint and discharged home with a benign workup.  Patient is hemodynamically stable in the ED. Her nausea and pain have improved with anti-emetics and analgesia.  On exam she does have diffuse tenderness in her epigastrium with no distention, peritoneal signs or other evidence of acute abdomen. Labs were noncontributory, no evidence of pancreatitis Patient reports recent change in insurance and will be able to see GI specialist, will provide referral as well as pain medicine and antiemetics.  Appears to be an exacerbation of chronic epigastric pain with no emergent or acute source for symptoms. Discussed follow-up with GI, PCP and return precautions, patient very amenable to plan Prior to patient discharge, I discussed and reviewed this case with  Dr. Freida BusmanAllen    Final diagnoses:  Abdominal pain, epigastric       Sharlene MottsBenjamin W Joye Wesenberg, PA-C 08/10/14 1741  Toy BakerAnthony T Allen, MD 08/11/14 [phone]

## 2014-08-10 NOTE — ED Notes (Signed)
Pt c/o upper abdominal pain and emesis x 5 days.  Pain score 10/10.  Pt was seen 3 days ago for same.   Hx of pancreatitis.

## 2014-08-10 NOTE — Discharge Instructions (Signed)
Abdominal Pain, Women °Abdominal (stomach, pelvic, or belly) pain can be caused by many things. It is important to tell your doctor: °· The location of the pain. °· Does it come and go or is it present all the time? °· Are there things that start the pain (eating certain foods, exercise)? °· Are there other symptoms associated with the pain (fever, nausea, vomiting, diarrhea)? °All of this is helpful to know when trying to find the cause of the pain. °CAUSES  °· Stomach: virus or bacteria infection, or ulcer. °· Intestine: appendicitis (inflamed appendix), regional ileitis (Crohn's disease), ulcerative colitis (inflamed colon), irritable bowel syndrome, diverticulitis (inflamed diverticulum of the colon), or cancer of the stomach or intestine. °· Gallbladder disease or stones in the gallbladder. °· Kidney disease, kidney stones, or infection. °· Pancreas infection or cancer. °· Fibromyalgia (pain disorder). °· Diseases of the female organs: °¨ Uterus: fibroid (non-cancerous) tumors or infection. °¨ Fallopian tubes: infection or tubal pregnancy. °¨ Ovary: cysts or tumors. °¨ Pelvic adhesions (scar tissue). °¨ Endometriosis (uterus lining tissue growing in the pelvis and on the pelvic organs). °¨ Pelvic congestion syndrome (female organs filling up with blood just before the menstrual period). °¨ Pain with the menstrual period. °¨ Pain with ovulation (producing an egg). °¨ Pain with an IUD (intrauterine device, birth control) in the uterus. °¨ Cancer of the female organs. °· Functional pain (pain not caused by a disease, may improve without treatment). °· Psychological pain. °· Depression. °DIAGNOSIS  °Your doctor will decide the seriousness of your pain by doing an examination. °· Blood tests. °· X-rays. °· Ultrasound. °· CT scan (computed tomography, special type of X-ray). °· MRI (magnetic resonance imaging). °· Cultures, for infection. °· Barium enema (dye inserted in the large intestine, to better view it with  X-rays). °· Colonoscopy (looking in intestine with a lighted tube). °· Laparoscopy (minor surgery, looking in abdomen with a lighted tube). °· Major abdominal exploratory surgery (looking in abdomen with a large incision). °TREATMENT  °The treatment will depend on the cause of the pain.  °· Many cases can be observed and treated at home. °· Over-the-counter medicines recommended by your caregiver. °· Prescription medicine. °· Antibiotics, for infection. °· Birth control pills, for painful periods or for ovulation pain. °· Hormone treatment, for endometriosis. °· Nerve blocking injections. °· Physical therapy. °· Antidepressants. °· Counseling with a psychologist or psychiatrist. °· Minor or major surgery. °HOME CARE INSTRUCTIONS  °· Do not take laxatives, unless directed by your caregiver. °· Take over-the-counter pain medicine only if ordered by your caregiver. Do not take aspirin because it can cause an upset stomach or bleeding. °· Try a clear liquid diet (broth or water) as ordered by your caregiver. Slowly move to a bland diet, as tolerated, if the pain is related to the stomach or intestine. °· Have a thermometer and take your temperature several times a day, and record it. °· Bed rest and sleep, if it helps the pain. °· Avoid sexual intercourse, if it causes pain. °· Avoid stressful situations. °· Keep your follow-up appointments and tests, as your caregiver orders. °· If the pain does not go away with medicine or surgery, you may try: °¨ Acupuncture. °¨ Relaxation exercises (yoga, meditation). °¨ Group therapy. °¨ Counseling. °SEEK MEDICAL CARE IF:  °· You notice certain foods cause stomach pain. °· Your home care treatment is not helping your pain. °· You need stronger pain medicine. °· You want your IUD removed. °· You feel faint or   lightheaded.  You develop nausea and vomiting.  You develop a rash.  You are having side effects or an allergy to your medicine. SEEK IMMEDIATE MEDICAL CARE IF:   Your  pain does not go away or gets worse.  You have a fever.  Your pain is felt only in portions of the abdomen. The right side could possibly be appendicitis. The left lower portion of the abdomen could be colitis or diverticulitis.  You are passing blood in your stools (bright red or black tarry stools, with or without vomiting).  You have blood in your urine.  You develop chills, with or without a fever.  You pass out. MAKE SURE YOU:   Understand these instructions.  Will watch your condition.  Will get help right away if you are not doing well or get worse. Document Released: 06/01/2007 Document Revised: 12/19/2013 Document Reviewed: 06/21/2009 [facility] Patient Information 2015 [place], [place]. This information is not intended to replace advice given to you by your health care provider. Make sure you discuss any questions you have with your health care provider.   You were evaluated in the ED today for your epigastric abdominal pain. There does not appear to be an emergent cause for your pain at this time. You have been given a referral for [facility], it is important for him to follow up with him for further evaluation and management of your symptoms. You may take your pain medicine for severe pain. Return to ED for worsening symptoms

## 2014-08-16 ENCOUNTER — Other Ambulatory Visit: Payer: Self-pay | Admitting: Physical Medicine & Rehabilitation

## 2014-08-22 ENCOUNTER — Encounter: Payer: Self-pay | Admitting: Physical Medicine & Rehabilitation

## 2014-08-22 ENCOUNTER — Other Ambulatory Visit: Payer: Self-pay | Admitting: Physical Medicine & Rehabilitation

## 2014-08-22 ENCOUNTER — Encounter: Payer: Medicaid Other | Attending: Physical Medicine & Rehabilitation | Admitting: Physical Medicine & Rehabilitation

## 2014-08-22 VITALS — BP 134/80 | HR 86 | Resp 14

## 2014-08-22 DIAGNOSIS — K861 Other chronic pancreatitis: Secondary | ICD-10-CM | POA: Diagnosis not present

## 2014-08-22 DIAGNOSIS — M545 Low back pain: Secondary | ICD-10-CM | POA: Diagnosis not present

## 2014-08-22 DIAGNOSIS — G35 Multiple sclerosis: Secondary | ICD-10-CM | POA: Diagnosis not present

## 2014-08-22 DIAGNOSIS — Z5181 Encounter for therapeutic drug level monitoring: Secondary | ICD-10-CM

## 2014-08-22 DIAGNOSIS — G894 Chronic pain syndrome: Secondary | ICD-10-CM | POA: Diagnosis present

## 2014-08-22 DIAGNOSIS — Z79899 Other long term (current) drug therapy: Secondary | ICD-10-CM

## 2014-08-22 DIAGNOSIS — K859 Acute pancreatitis without necrosis or infection, unspecified: Secondary | ICD-10-CM

## 2014-08-22 MED ORDER — TRAMADOL HCL 50 MG PO TABS: 50.0000 mg | ORAL_TABLET | Freq: Four times a day (QID) | ORAL | Status: DC | PRN

## 2014-08-22 MED ORDER — TOPIRAMATE 25 MG PO TABS: 50.0000 mg | ORAL_TABLET | Freq: Every day | ORAL | Status: DC

## 2014-08-22 NOTE — Patient Instructions (Addendum)
ONCE I HAVE CONFIRMATION THAT YOUR URINE SPECIMEN IS CONSISTENT WITH YOUR HISTORY AND PRESCRIBED MEDICATIONS, I WILL BE WILLING TO PRESCRIBE YOUR PAIN MEDICATION. THE RESULTS OF YOUR URINE TESTING COULD TAKE A WEEK OR MORE TO RETURN, HOWEVER.  IF WE DO NOT CONTACT YOU REGARDING THESE RESULTS WITHIN 10 DAYS, PLEASE CONTACT us.       USE YOUR AFO WORK ON IMPROVED POSTURE AND RANGE OF MOTION IN LEGS AND BACK.  TAKE TOPAMAX  AT BED TIME FOR ONE WEEK AND THEN  (TWO PILLS) THEREAFTER  Low Back Strain with Rehab A strain is an injury in which a tendon or muscle is torn. The muscles and tendons of the lower back are vulnerable to strains. However, these muscles and tendons are very strong and require a great force to be injured. Strains are classified into three categories. Grade 1 strains cause pain, but the tendon is not lengthened. Grade 2 strains include a lengthened ligament, due to the ligament being stretched or partially ruptured. With grade 2 strains there is still function, although the function may be decreased. Grade 3 strains involve a complete tear of the tendon or muscle, and function is usually impaired. SYMPTOMS   Pain in the lower back.  Pain that affects one side more than the other.  Pain that gets worse with movement and may be felt in the hip, buttocks, or back of the thigh.  Muscle spasms of the muscles in the back.  Swelling along the muscles of the back.  Loss of strength of the back muscles.  Crackling sound (crepitation) when the muscles are touched. CAUSES  Lower back strains occur when a force is placed on the muscles or tendons that is greater than they can handle. Common causes of injury include:  Prolonged overuse of the muscle-tendon units in the lower back, usually from incorrect posture.  A single violent injury or force applied to the back. RISK INCREASES WITH:  Sports that involve twisting forces on the spine or a lot of bending at the waist  (football, rugby, weightlifting, bowling, golf, tennis, speed skating, racquetball, swimming, running, gymnastics, diving).  Poor strength and flexibility.  Failure to warm up properly before activity.  Family history of lower back pain or disk disorders.  Previous back injury or surgery (especially fusion).  Poor posture with lifting, especially heavy objects.  Prolonged sitting, especially with poor posture. PREVENTION   Learn and use proper posture when sitting or lifting (maintain proper posture when sitting, lift using the knees and legs, not at the waist).  Warm up and stretch properly before activity.  Allow for adequate recovery between workouts.  Maintain physical fitness:  Strength, flexibility, and endurance.  Cardiovascular fitness. PROGNOSIS  If treated properly, lower back strains usually heal within 6 weeks. RELATED COMPLICATIONS   Recurring symptoms, resulting in a chronic problem.  Chronic inflammation, scarring, and partial muscle-tendon tear.  Delayed healing or resolution of symptoms.  Prolonged disability. TREATMENT  Treatment first involves the use of ice and medicine, to reduce pain and inflammation. The use of strengthening and stretching exercises may help reduce pain with activity. These exercises may be performed at home or with a therapist. Severe injuries may require referral to a therapist for further evaluation and treatment, such as ultrasound. Your caregiver may advise that you wear a back brace or corset, to help reduce pain and discomfort. Often, prolonged bed rest results in greater harm then benefit. Corticosteroid injections may be recommended. However, these should be reserved  for the most serious cases. It is important to avoid using your back when lifting objects. At night, sleep on your back on a firm mattress with a pillow placed under your knees. If non-surgical treatment is unsuccessful, surgery may be needed.  MEDICATION   If pain  medicine is needed, nonsteroidal anti-inflammatory medicines (aspirin and ibuprofen), or other minor pain relievers (acetaminophen), are often advised.  Do not take pain medicine for 7 days before surgery.  Prescription pain relievers may be given, if your caregiver thinks they are needed. Use only as directed and only as much as you need.  Ointments applied to the skin may be helpful.  Corticosteroid injections may be given by your caregiver. These injections should be reserved for the most serious cases, because they may only be given a certain number of times. HEAT AND COLD  Cold treatment (icing) should be applied for 10 to 15 minutes every 2 to 3 hours for inflammation and pain, and immediately after activity that aggravates your symptoms. Use ice packs or an ice massage.  Heat treatment may be used before performing stretching and strengthening activities prescribed by your caregiver, physical therapist, or athletic trainer. Use a heat pack or a warm water soak. SEEK MEDICAL CARE IF:   Symptoms get worse or do not improve in 2 to 4 weeks, despite treatment.  You develop numbness, weakness, or loss of bowel or bladder function.  New, unexplained symptoms develop. (Drugs used in treatment may produce side effects.) EXERCISES  RANGE OF MOTION (ROM) AND STRETCHING EXERCISES - Low Back Strain Most people with lower back pain will find that their symptoms get worse with excessive bending forward (flexion) or arching at the lower back (extension). The exercises which will help resolve your symptoms will focus on the opposite motion.  Your physician, physical therapist or athletic trainer will help you determine which exercises will be most helpful to resolve your lower back pain. Do not complete any exercises without first consulting with your caregiver. Discontinue any exercises which make your symptoms worse until you speak to your caregiver.  If you have pain, numbness or tingling which  travels down into your buttocks, leg or foot, the goal of the therapy is for these symptoms to move closer to your back and eventually resolve. Sometimes, these leg symptoms will get better, but your lower back pain may worsen. This is typically an indication of progress in your rehabilitation. Be very alert to any changes in your symptoms and the activities in which you participated in the 24 hours prior to the change. Sharing this information with your caregiver will allow him/her to most efficiently treat your condition.  These exercises may help you when beginning to rehabilitate your injury. Your symptoms may resolve with or without further involvement from your physician, physical therapist or athletic trainer. While completing these exercises, remember:  Restoring tissue flexibility helps normal motion to return to the joints. This allows healthier, less painful movement and activity.  An effective stretch should be held for at least 30 seconds.  A stretch should never be painful. You should only feel a gentle lengthening or release in the stretched tissue. FLEXION RANGE OF MOTION AND STRETCHING EXERCISES: STRETCH - Flexion, Single Knee to Chest   Lie on a firm bed or floor with both legs extended in front of you.  Keeping one leg in contact with the floor, bring your opposite knee to your chest. Hold your leg in place by either grabbing behind your thigh  or at your knee.  Pull until you feel a gentle stretch in your lower back. Hold __________ seconds.  Slowly release your grasp and repeat the exercise with the opposite side. Repeat __________ times. Complete this exercise __________ times per day.  STRETCH - Flexion, Double Knee to Chest   Lie on a firm bed or floor with both legs extended in front of you.  Keeping one leg in contact with the floor, bring your opposite knee to your chest.  Tense your stomach muscles to support your back and then lift your other knee to your chest.  Hold your legs in place by either grabbing behind your thighs or at your knees.  Pull both knees toward your chest until you feel a gentle stretch in your lower back. Hold __________ seconds.  Tense your stomach muscles and slowly return one leg at a time to the floor. Repeat __________ times. Complete this exercise __________ times per day.  STRETCH - Low Trunk Rotation  Lie on a firm bed or floor. Keeping your legs in front of you, bend your knees so they are both pointed toward the ceiling and your feet are flat on the floor.  Extend your arms out to the side. This will stabilize your upper body by keeping your shoulders in contact with the floor.  Gently and slowly drop both knees together to one side until you feel a gentle stretch in your lower back. Hold for __________ seconds.  Tense your stomach muscles to support your lower back as you bring your knees back to the starting position. Repeat the exercise to the other side. Repeat __________ times. Complete this exercise __________ times per day  EXTENSION RANGE OF MOTION AND FLEXIBILITY EXERCISES: STRETCH - Extension, Prone on Elbows   Lie on your stomach on the floor, a bed will be too soft. Place your palms about shoulder width apart and at the height of your head.  Place your elbows under your shoulders. If this is too painful, stack pillows under your chest.  Allow your body to relax so that your hips drop lower and make contact more completely with the floor.  Hold this position for __________ seconds.  Slowly return to lying flat on the floor. Repeat __________ times. Complete this exercise __________ times per day.  RANGE OF MOTION - Extension, Prone Press Ups  Lie on your stomach on the floor, a bed will be too soft. Place your palms about shoulder width apart and at the height of your head.  Keeping your back as relaxed as possible, slowly straighten your elbows while keeping your hips on the floor. You may adjust  the placement of your hands to maximize your comfort. As you gain motion, your hands will come more underneath your shoulders.  Hold this position __________ seconds.  Slowly return to lying flat on the floor. Repeat __________ times. Complete this exercise __________ times per day.  RANGE OF MOTION- Quadruped, Neutral Spine   Assume a hands and knees position on a firm surface. Keep your hands under your shoulders and your knees under your hips. You may place padding under your knees for comfort.  Drop your head and point your tail bone toward the ground below you. This will round out your lower back like an angry cat. Hold this position for __________ seconds.  Slowly lift your head and release your tail bone so that your back sags into a large arch, like an old horse.  Hold this position for __________  seconds.  Repeat this until you feel limber in your lower back.  Now, find your "sweet spot." This will be the most comfortable position somewhere between the two previous positions. This is your neutral spine. Once you have found this position, tense your stomach muscles to support your lower back.  Hold this position for __________ seconds. Repeat __________ times. Complete this exercise __________ times per day.  STRENGTHENING EXERCISES - Low Back Strain These exercises may help you when beginning to rehabilitate your injury. These exercises should be done near your "sweet spot." This is the neutral, low-back arch, somewhere between fully rounded and fully arched, that is your least painful position. When performed in this safe range of motion, these exercises can be used for people who have either a flexion or extension based injury. These exercises may resolve your symptoms with or without further involvement from your physician, physical therapist or athletic trainer. While completing these exercises, remember:   Muscles can gain both the endurance and the strength needed for everyday  activities through controlled exercises.  Complete these exercises as instructed by your physician, physical therapist or athletic trainer. Increase the resistance and repetitions only as guided.  You may experience muscle soreness or fatigue, but the pain or discomfort you are trying to eliminate should never worsen during these exercises. If this pain does worsen, stop and make certain you are following the directions exactly. If the pain is still present after adjustments, discontinue the exercise until you can discuss the trouble with your caregiver. STRENGTHENING - Deep Abdominals, Pelvic Tilt  Lie on a firm bed or floor. Keeping your legs in front of you, bend your knees so they are both pointed toward the ceiling and your feet are flat on the floor.  Tense your lower abdominal muscles to press your lower back into the floor. This motion will rotate your pelvis so that your tail bone is scooping upwards rather than pointing at your feet or into the floor.  With a gentle tension and even breathing, hold this position for __________ seconds. Repeat __________ times. Complete this exercise __________ times per day.  STRENGTHENING - Abdominals, Crunches   Lie on a firm bed or floor. Keeping your legs in front of you, bend your knees so they are both pointed toward the ceiling and your feet are flat on the floor. Cross your arms over your chest.  Slightly tip your chin down without bending your neck.  Tense your abdominals and slowly lift your trunk high enough to just clear your shoulder blades. Lifting higher can put excessive stress on the lower back and does not further strengthen your abdominal muscles.  Control your return to the starting position. Repeat __________ times. Complete this exercise __________ times per day.  STRENGTHENING - Quadruped, Opposite UE/LE Lift   Assume a hands and knees position on a firm surface. Keep your hands under your shoulders and your knees under your  hips. You may place padding under your knees for comfort.  Find your neutral spine and gently tense your abdominal muscles so that you can maintain this position. Your shoulders and hips should form a rectangle that is parallel with the floor and is not twisted.  Keeping your trunk steady, lift your right hand no higher than your shoulder and then your left leg no higher than your hip. Make sure you are not holding your breath. Hold this position __________ seconds.  Continuing to keep your abdominal muscles tense and your back steady, slowly  return to your starting position. Repeat with the opposite arm and leg. Repeat __________ times. Complete this exercise __________ times per day.  STRENGTHENING - Lower Abdominals, Double Knee Lift  Lie on a firm bed or floor. Keeping your legs in front of you, bend your knees so they are both pointed toward the ceiling and your feet are flat on the floor.  Tense your abdominal muscles to brace your lower back and slowly lift both of your knees until they come over your hips. Be certain not to hold your breath.  Hold __________ seconds. Using your abdominal muscles, return to the starting position in a slow and controlled manner. Repeat __________ times. Complete this exercise __________ times per day.  POSTURE AND BODY MECHANICS CONSIDERATIONS - Low Back Strain Keeping correct posture when sitting, standing or completing your activities will reduce the stress put on different body tissues, allowing injured tissues a chance to heal and limiting painful experiences. The following are general guidelines for improved posture. Your physician or physical therapist will provide you with any instructions specific to your needs. While reading these guidelines, remember:  The exercises prescribed by your provider will help you have the flexibility and strength to maintain correct postures.  The correct posture provides the best environment for your joints to work.  All of your joints have less wear and tear when properly supported by a spine with good posture. This means you will experience a healthier, less painful body.  Correct posture must be practiced with all of your activities, especially prolonged sitting and standing. Correct posture is as important when doing repetitive low-stress activities (typing) as it is when doing a single heavy-load activity (lifting). RESTING POSITIONS Consider which positions are most painful for you when choosing a resting position. If you have pain with flexion-based activities (sitting, bending, stooping, squatting), choose a position that allows you to rest in a less flexed posture. You would want to avoid curling into a fetal position on your side. If your pain worsens with extension-based activities (prolonged standing, working overhead), avoid resting in an extended position such as sleeping on your stomach. Most people will find more comfort when they rest with their spine in a more neutral position, neither too rounded nor too arched. Lying on a non-sagging bed on your side with a pillow between your knees, or on your back with a pillow under your knees will often provide some relief. Keep in mind, being in any one position for a prolonged period of time, no matter how correct your posture, can still lead to stiffness. PROPER SITTING POSTURE In order to minimize stress and discomfort on your spine, you must sit with correct posture. Sitting with good posture should be effortless for a healthy body. Returning to good posture is a gradual process. Many people can work toward this most comfortably by using various supports until they have the flexibility and strength to maintain this posture on their own. When sitting with proper posture, your ears will fall over your shoulders and your shoulders will fall over your hips. You should use the back of the chair to support your upper back. Your lower back will be in a neutral  position, just slightly arched. You may place a small pillow or folded towel at the base of your lower back for support.  When working at a desk, create an environment that supports good, upright posture. Without extra support, muscles tire, which leads to excessive strain on joints and other tissues. Keep these  recommendations in mind: CHAIR:  A chair should be able to slide under your desk when your back makes contact with the back of the chair. This allows you to work closely.  The chair's height should allow your eyes to be level with the upper part of your monitor and your hands to be slightly lower than your elbows. BODY POSITION  Your feet should make contact with the floor. If this is not possible, use a foot rest.  Keep your ears over your shoulders. This will reduce stress on your neck and lower back. INCORRECT SITTING POSTURES  If you are feeling tired and unable to assume a healthy sitting posture, do not slouch or slump. This puts excessive strain on your back tissues, causing more damage and pain. Healthier options include:  Using more support, like a lumbar pillow.  Switching tasks to something that requires you to be upright or walking.  Talking a brief walk.  Lying down to rest in a neutral-spine position. PROLONGED STANDING WHILE SLIGHTLY LEANING FORWARD  When completing a task that requires you to lean forward while standing in one place for a long time, place either foot up on a stationary 2-4 inch high object to help maintain the best posture. When both feet are on the ground, the lower back tends to lose its slight inward curve. If this curve flattens (or becomes too large), then the back and your other joints will experience too much stress, tire more quickly, and can cause pain. CORRECT STANDING POSTURES Proper standing posture should be assumed with all daily activities, even if they only take a few moments, like when brushing your teeth. As in sitting, your ears  should fall over your shoulders and your shoulders should fall over your hips. You should keep a slight tension in your abdominal muscles to brace your spine. Your tailbone should point down to the ground, not behind your body, resulting in an over-extended swayback posture.  INCORRECT STANDING POSTURES  Common incorrect standing postures include a forward head, locked knees and/or an excessive swayback. WALKING Walk with an upright posture. Your ears, shoulders and hips should all line-up. PROLONGED ACTIVITY IN A FLEXED POSITION When completing a task that requires you to bend forward at your waist or lean over a low surface, try to find a way to stabilize 3 out of 4 of your limbs. You can place a hand or elbow on your thigh or rest a knee on the surface you are reaching across. This will provide you more stability so that your muscles do not fatigue as quickly. By keeping your knees relaxed, or slightly bent, you will also reduce stress across your lower back. CORRECT LIFTING TECHNIQUES DO :   Assume a wide stance. This will provide you more stability and the opportunity to get as close as possible to the object which you are lifting.  Tense your abdominals to brace your spine. Bend at the knees and hips. Keeping your back locked in a neutral-spine position, lift using your leg muscles. Lift with your legs, keeping your back straight.  Test the weight of unknown objects before attempting to lift them.  Try to keep your elbows locked down at your sides in order get the best strength from your shoulders when carrying an object.  Always ask for help when lifting heavy or awkward objects. INCORRECT LIFTING TECHNIQUES DO NOT:   Lock your knees when lifting, even if it is a small object.  Bend and twist. Pivot at your  feet or move your feet when needing to change directions.  Assume that you can safely pick up even a paper clip without proper posture. Document Released: 08/04/2005 Document  Revised: 10/27/2011 Document Reviewed: 11/16/2008 [facility] Patient Information 2015 [place], [place]. This information is not intended to replace advice given to you by your health care provider. Make sure you discuss any questions you have with your health care provider.

## 2014-08-22 NOTE — Progress Notes (Signed)
Subjective:    Patient ID: Mary [name], female    DOB: [birth year], 39 y.o.   MRN: [id number]  HPI   Mrs. Edwyna Shell is here in follow up of her MS. She has been having problems with increased pain related to the MS and her pancreatitis.   Her pain is most severe on the right side of her body, right foot/leg, back and hypogastric area. Her abdominal pain is worst after she eats.   For pain relief she is using oxycodone  for pain relief. She has been taking it 2 x daily over the last 6-8 weeks generally. She takes tramadol  4x daily.  Prior to these last two months she was taking medication rarely.   She has a right AFO but doesn't use it because it makes her feel "off balance"  Her mother manages most of her medications.   Pain Inventory Average Pain 7 Pain Right Now 8 My pain is constant, sharp, burning, aching and tighness  In the last 24 hours, has pain interfered with the following? General activity 6 Relation with others 6 Enjoyment of life 6 What TIME of day is your pain at its worst? ALL Sleep (in general) Poor  Pain is worse with: walking, bending, sitting, standing and some activites Pain improves with: rest and medication Relief from Meds: 8  Mobility walk without assistance how many minutes can you walk? 30-45 ability to climb steps?  yes do you drive?  yes  Function disabled: date disabled .  Neuro/Psych weakness numbness tingling spasms anxiety  Prior Studies Any changes since last visit?  no  Physicians involved in your care Any changes since last visit?  no   Family History  Problem Relation Age of Onset  . Lupus Neg Hx   . Sarcoidosis Neg Hx   . Pancreatitis Neg Hx   . Ataxia Neg Hx   . Chorea Neg Hx   . Dementia Neg Hx   . Mental retardation Neg Hx   . Migraines Neg Hx   . Multiple sclerosis Neg Hx   . Neurofibromatosis Neg Hx   . Neuropathy Neg Hx   . Parkinsonism Neg Hx   . Seizures Neg Hx   . Stroke Neg Hx   . Colitis  Maternal Aunt   . Diabetes Mother   . Diabetes Father   . Diabetes      many family members   History   Social History  . Marital Status: Single    Spouse Name: N/A    Number of Children: N/A  . Years of Education: N/A   Social History Main Topics  . Smoking status: Former Smoker -- 0.50 packs/day for 20 years    Types: Cigarettes    Quit date: 05/26/2013  . Smokeless tobacco: Never Used  . Alcohol Use: No  . Drug Use: No  . Sexual Activity: No   Other Topics Concern  . None   Social History Narrative   Lives permanently in [place] with mom and stepfather.  Has not started working yet and was unemployed in [place].            Past Surgical History  Procedure Laterality Date  . Abdominal surgery  ~ 2007    some sort of pancreatic cyst drainage.   . Cholecystectomy      ~ age 32-laparoscopic  . Adenoidectomy    . 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  Eus N/A 04/14/2013    Procedure: UPPER ENDOSCOPIC ULTRASOUND (EUS) LINEAR;  Surgeon: Rachael Fee, MD;  Location: [facility];  Service: Endoscopy;  Laterality: N/A;   Past Medical History  Diagnosis Date  . Hernia 03-24-13    ventral hernia remains   . GERD (gastroesophageal reflux disease)   . Pancreatitis 03-24-13    2002, 2'2013, 03-14-13  . Pancreatic cyst 2002 onset  . Anxiety   . Depression     "recent breakup with partner of 15 yrs"  . Adrenal insufficiency 04/23/2013    ??  . MS (multiple sclerosis)    BP 134/80 mmHg  Pulse 86  Resp 14  SpO2 98%  Opioid Risk Score:   Fall Risk Score: Low Fall Risk (0-5 points)  Review of Systems  Constitutional: Positive for fatigue.  HENT: Negative.   Eyes: Negative.   Respiratory: Negative.   Cardiovascular: Negative.   Gastrointestinal: Positive for abdominal pain.  Genitourinary: Negative.   Musculoskeletal: Positive for myalgias, back pain and arthralgias.  Skin: Negative.   Allergic/Immunologic:  Negative.   Neurological: Positive for weakness and numbness.       Spasms  Psychiatric/Behavioral: Positive for dysphoric mood. The patient is nervous/anxious.        Objective:   Physical Exam   General: Alert and oriented x 3, No apparent distress. obese HEENT: Head is normocephalic, atraumatic, PERRLA, EOMI, sclera anicteric, oral mucosa pink and moist, dentition intact, ext ear canals clear,  Neck: Supple without JVD or lymphadenopathy Heart: Reg rate and rhythm. No murmurs rubs or gallops Chest: CTA bilaterally without wheezes, rales, or rhonchi; no distress Abdomen: Soft, non-tender, non-distended, bowel sounds positive. Extremities: No clubbing, cyanosis, or edema. Pulses are 2+ Skin: Clean and intact without signs of breakdown Neuro: Pt is cognitively appropriate with normal insight, memory, and awareness. Cranial nerves 2-12 are intact. Sensory exam is slightly decreased on the right. Reflexes are 3+ right and 2+ left. Strength 4/5 rue prox to distal. 3- to 3+ RLE in all muscle groups in inconsistent pattern.  Fine motor coordination is decreased RLE and RUE. She struggles with clearance of the right foot during swing phase of gait. . No tremors. .  Musculoskeletal: decreased grip and ROM in the low back Low back tender with flexion, extension and rotation. SLR + on right. Hamstrings tight. mid back paraspinals taut. She walks with antalgia on the right and lean to the right.  Psych: Pt's affect is appropriate. Pt is cooperative        Assessment & Plan:  1. Relapsing remitting MS 2. 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 Low back pain with ? Radiculopathy RLE 4. Chronic pancreatitis   Plan: 1. Trial of topamax for headaches and neuropathic pain. She has had "friends" who have warned her of the SE of gabapentin and that it "ruins your nerves."  Although she was open to trying it after we talked, I think we should try to steer  clear of this initially.  2. Tramadol #60 were written today to help with neuro/abd pain 3. A UDS was collected today. I will consider writing her oxycodone if UDS is consistent. I told the patient that I would not write narcotics if she is  Unwilling to try other meds and modalities for treatment of her pain. 4. Consider an MRI of her lumbar spine to assess nerve roots. Xray of lumbar spine is from December of 2014. She was provided with lumbar spine/pelvis  exercises today to work on at home 5. I would like her wearing her right AFO to help stabilize her gait and to improve her posture. It should ultimately help with some of her LBP. 6. Needs to see a gastroenterologist to manage her pancreatitis. I told her that we don't manage visceral/GI related pain syndromes in this office.  7. Follow up with [name] in about a month. Forty-five minutes of face to face patient care time were spent during this visit. All questions were encouraged and answered.

## 2014-08-23 LAB — PMP ALCOHOL METABOLITE (ETG): ETGU: NEGATIVE ng/mL

## 2014-08-25 LAB — TRAMADOL, URINE
N-DESMETHYL-CIS-TRAMADOL: 4241 ng/mL (ref <100)
TRAMADOL, URINE: 3969 ng/mL (ref <100)

## 2014-08-25 LAB — OXYCODONE, URINE (LC/MS-MS)
Noroxycodone, Ur: 269 ng/mL (ref <50)
OXYMORPHONE, URINE: 696 ng/mL (ref <50)
Oxycodone, ur: 122 ng/mL (ref <50)

## 2014-08-26 LAB — PRESCRIPTION MONITORING PROFILE (SOLSTAS)
Amphetamine/Meth: NEGATIVE ng/mL
BARBITURATE SCREEN, URINE: NEGATIVE ng/mL
BUPRENORPHINE, URINE: NEGATIVE ng/mL
Benzodiazepine Screen, Urine: NEGATIVE ng/mL
Cannabinoid Scrn, Ur: NEGATIVE ng/mL
Carisoprodol, Urine: NEGATIVE ng/mL
Cocaine Metabolites: NEGATIVE ng/mL
Creatinine, Urine: 71.16 mg/dL (ref >20.0)
ECSTASY: NEGATIVE ng/mL
FENTANYL URINE: NEGATIVE ng/mL
Meperidine, Ur: NEGATIVE ng/mL
Methadone Screen, Urine: NEGATIVE ng/mL
Nitrites, Initial: NEGATIVE ug/mL
Opiate Screen, Urine: NEGATIVE ng/mL
PROPOXYPHENE: NEGATIVE ng/mL
TAPENTADOLUR: NEGATIVE ng/mL
ZOLPIDEM, URINE: NEGATIVE ng/mL
pH, Initial: 5.5 pH (ref 4.5–8.9)

## 2014-08-28 NOTE — Progress Notes (Signed)
Urine drug screen for this encounter is consistent for reported/prescribed medications.

## 2014-09-04 ENCOUNTER — Telehealth: Payer: Self-pay | Admitting: *Deleted

## 2014-09-04 NOTE — Telephone Encounter (Signed)
Called patient and left message.  Looks like UDS is OK... Is it ok to prescribe the Oxycodone 10mg  q6h  - #120 RF#0.  Please advise

## 2014-09-04 NOTE — Telephone Encounter (Signed)
Patient is calling about the results of her UDS... Needs her pain meds refilled.  Please call patient - results not back yet

## 2014-09-04 NOTE — Telephone Encounter (Signed)
yes

## 2014-09-05 ENCOUNTER — Other Ambulatory Visit: Payer: Self-pay | Admitting: *Deleted

## 2014-09-05 MED ORDER — OXYCODONE HCL 10 MG PO TABS: 10.0000 mg | ORAL_TABLET | Freq: Four times a day (QID) | ORAL | Status: DC | PRN

## 2014-09-05 NOTE — Telephone Encounter (Signed)
Ok to refill as per Librarian, academic.  Printed out RX, called patient she will pick up

## 2014-09-25 ENCOUNTER — Encounter: Payer: Self-pay | Admitting: Physical Medicine & Rehabilitation

## 2014-09-25 ENCOUNTER — Encounter: Payer: Medicaid Other | Attending: Physical Medicine & Rehabilitation | Admitting: Physical Medicine & Rehabilitation

## 2014-09-25 ENCOUNTER — Emergency Department (HOSPITAL_COMMUNITY)
Admission: EM | Admit: 2014-09-25 | Discharge: 2014-09-26 | Disposition: A | Discharge status: Home / Self Care | Payer: Medicaid Other | Attending: Emergency Medicine | Admitting: Emergency Medicine

## 2014-09-25 ENCOUNTER — Encounter (HOSPITAL_COMMUNITY): Payer: Self-pay | Admitting: *Deleted

## 2014-09-25 VITALS — BP 114/63 | HR 95 | Resp 14

## 2014-09-25 DIAGNOSIS — Z91040 Latex allergy status: Secondary | ICD-10-CM | POA: Insufficient documentation

## 2014-09-25 DIAGNOSIS — Z9889 Other specified postprocedural states: Secondary | ICD-10-CM | POA: Diagnosis not present

## 2014-09-25 DIAGNOSIS — G894 Chronic pain syndrome: Secondary | ICD-10-CM | POA: Insufficient documentation

## 2014-09-25 DIAGNOSIS — G35 Multiple sclerosis: Secondary | ICD-10-CM | POA: Insufficient documentation

## 2014-09-25 DIAGNOSIS — Z79899 Other long term (current) drug therapy: Secondary | ICD-10-CM | POA: Insufficient documentation

## 2014-09-25 DIAGNOSIS — M792 Neuralgia and neuritis, unspecified: Secondary | ICD-10-CM | POA: Insufficient documentation

## 2014-09-25 DIAGNOSIS — F419 Anxiety disorder, unspecified: Secondary | ICD-10-CM | POA: Diagnosis not present

## 2014-09-25 DIAGNOSIS — R1013 Epigastric pain: Secondary | ICD-10-CM

## 2014-09-25 DIAGNOSIS — R112 Nausea with vomiting, unspecified: Secondary | ICD-10-CM | POA: Diagnosis not present

## 2014-09-25 DIAGNOSIS — M545 Low back pain: Secondary | ICD-10-CM | POA: Insufficient documentation

## 2014-09-25 DIAGNOSIS — Z880 Allergy status to penicillin: Secondary | ICD-10-CM | POA: Insufficient documentation

## 2014-09-25 DIAGNOSIS — Z9049 Acquired absence of other specified parts of digestive tract: Secondary | ICD-10-CM | POA: Insufficient documentation

## 2014-09-25 DIAGNOSIS — Z8719 Personal history of other diseases of the digestive system: Secondary | ICD-10-CM | POA: Insufficient documentation

## 2014-09-25 DIAGNOSIS — Z87891 Personal history of nicotine dependence: Secondary | ICD-10-CM | POA: Diagnosis not present

## 2014-09-25 DIAGNOSIS — Z8639 Personal history of other endocrine, nutritional and metabolic disease: Secondary | ICD-10-CM | POA: Insufficient documentation

## 2014-09-25 DIAGNOSIS — F329 Major depressive disorder, single episode, unspecified: Secondary | ICD-10-CM | POA: Diagnosis not present

## 2014-09-25 DIAGNOSIS — K861 Other chronic pancreatitis: Secondary | ICD-10-CM | POA: Insufficient documentation

## 2014-09-25 DIAGNOSIS — G8929 Other chronic pain: Secondary | ICD-10-CM | POA: Diagnosis not present

## 2014-09-25 DIAGNOSIS — R110 Nausea: Secondary | ICD-10-CM

## 2014-09-25 LAB — COMPREHENSIVE METABOLIC PANEL
ALT: 24 U/L (ref 0–35)
AST: 24 U/L (ref 0–37)
Albumin: 4.3 g/dL (ref 3.5–5.2)
Alkaline Phosphatase: 88 U/L (ref 39–117)
Anion gap: 9 (ref 5–15)
BUN: 17 mg/dL (ref 6–23)
CO2: 20 mmol/L (ref 19–32)
Calcium: 9.2 mg/dL (ref 8.4–10.5)
Chloride: 106 mmol/L (ref 96–112)
Creatinine, Ser: 0.78 mg/dL (ref 0.50–1.10)
GFR calc Af Amer: >90 mL/min (ref >90)
GFR calc non Af Amer: >90 mL/min (ref >90)
Glucose, Bld: 144 mg/dL — ABNORMAL HIGH (ref 70–99)
Potassium: 3.6 mmol/L (ref 3.5–5.1)
Sodium: 135 mmol/L (ref 135–145)
Total Bilirubin: 0.5 mg/dL (ref 0.3–1.2)
Total Protein: 8.3 g/dL (ref 6.0–8.3)

## 2014-09-25 LAB — URINALYSIS, ROUTINE W REFLEX MICROSCOPIC
Glucose, UA: NEGATIVE mg/dL
Hgb urine dipstick: NEGATIVE
Ketones, ur: NEGATIVE mg/dL
Nitrite: NEGATIVE
Protein, ur: NEGATIVE mg/dL
Specific Gravity, Urine: 1.023 (ref 1.005–1.030)
Urobilinogen, UA: 1 mg/dL (ref 0.0–1.0)
pH: 6 (ref 5.0–8.0)

## 2014-09-25 LAB — CBC WITH DIFFERENTIAL/PLATELET
Basophils Absolute: 0 10*3/uL (ref 0.0–0.1)
Basophils Relative: 0 % (ref 0–1)
Eosinophils Absolute: 0.1 10*3/uL (ref 0.0–0.7)
Eosinophils Relative: 2 % (ref 0–5)
HCT: 38.1 % (ref 36.0–46.0)
Hemoglobin: 12.7 g/dL (ref 12.0–15.0)
Lymphocytes Relative: 33 % (ref 12–46)
Lymphs Abs: 2.2 10*3/uL (ref 0.7–4.0)
MCH: 28.3 pg (ref 26.0–34.0)
MCHC: 33.3 g/dL (ref 30.0–36.0)
MCV: 85 fL (ref 78.0–100.0)
Monocytes Absolute: 0.8 10*3/uL (ref 0.1–1.0)
Monocytes Relative: 13 % — ABNORMAL HIGH (ref 3–12)
Neutro Abs: 3.4 10*3/uL (ref 1.7–7.7)
Neutrophils Relative %: 52 % (ref 43–77)
Platelets: 281 10*3/uL (ref 150–400)
RBC: 4.48 MIL/uL (ref 3.87–5.11)
RDW: 14.1 % (ref 11.5–15.5)
WBC: 6.6 10*3/uL (ref 4.0–10.5)

## 2014-09-25 LAB — URINE MICROSCOPIC-ADD ON

## 2014-09-25 LAB — LIPASE, BLOOD: Lipase: 14 U/L (ref 11–59)

## 2014-09-25 MED ORDER — TRAMADOL HCL 50 MG PO TABS: 50.0000 mg | ORAL_TABLET | Freq: Four times a day (QID) | ORAL | Status: DC | PRN

## 2014-09-25 MED ORDER — ONDANSETRON 8 MG PO TBDP
8.0000 mg | ORAL_TABLET | Freq: Once | ORAL | Status: AC
  Administered 2014-09-25: 8 mg via ORAL
  Filled 2014-09-25: qty 1

## 2014-09-25 MED ORDER — OXYCODONE HCL 10 MG PO TABS: 10.0000 mg | ORAL_TABLET | Freq: Four times a day (QID) | ORAL | Status: DC | PRN

## 2014-09-25 MED ORDER — CYCLOBENZAPRINE HCL 5 MG PO TABS: 5.0000 mg | ORAL_TABLET | Freq: Three times a day (TID) | ORAL | Status: DC | PRN

## 2014-09-25 MED ORDER — TOPIRAMATE 100 MG PO TABS: 100.0000 mg | ORAL_TABLET | Freq: Every day | ORAL | Status: DC

## 2014-09-25 NOTE — Patient Instructions (Signed)
PLEASE CALL ME WITH ANY PROBLEMS OR QUESTIONS (#[phone]).      

## 2014-09-25 NOTE — ED Notes (Signed)
Pt reports history of pancreatitis - states she experiencing similar symptoms to include bilat upper abd pain and n/v that began approx 1730 today. Pt denies fever, chills or diarrhea.

## 2014-09-25 NOTE — Progress Notes (Signed)
Subjective:    Patient ID: Mary [name], female    DOB: Apr 17, 1976, 39 y.o.   MRN: [id number]  HPI   Mary [name] is here in follow up of her MS. She has had persistent n/v/abdominal pain related likely to her pancreatitis. She sees her PCP tomorrow and hopes to get a GI referral from her PCP. She is trying to drink shakes/protein supplementation..   Her other areas of pain she feels that she's doing better. The oxycodone tid and tramadol between with prn flexeril. This has helped her emotionally and has allowed her to move more.   She is using her brace and it has helped her gait.   She is taking her topamax daily and denies any problems, but she doesn''t feel that the  dose has helped her much.      Pain Inventory Average Pain 6 Pain Right Now 8 My pain is sharp, burning and tingling  In the last 24 hours, has pain interfered with the following? General activity 4 Relation with others 5 Enjoyment of life 7 What TIME of day is your pain at its worst? morning and evening Sleep (in general) Poor  Pain is worse with: bending and sitting Pain improves with: medication Relief from Meds: 7  Mobility walk without assistance walk with assistance use a cane ability to climb steps?  yes do you drive?  yes  Function not employed: date last employed . disabled: date disabled .  Neuro/Psych weakness numbness tingling spasms depression anxiety  Prior Studies Any changes since last visit?  no  Physicians involved in your care Any changes since last visit?  no   Family History  Problem Relation Age of Onset  . Lupus Neg Hx   . Sarcoidosis Neg Hx   . Pancreatitis Neg Hx   . Ataxia Neg Hx   . Chorea Neg Hx   . Dementia Neg Hx   . Mental retardation Neg Hx   . Migraines Neg Hx   . Multiple sclerosis Neg Hx   . Neurofibromatosis Neg Hx   . Neuropathy Neg Hx   . Parkinsonism Neg Hx   . Seizures Neg Hx   . Stroke Neg Hx   . Colitis Maternal Aunt   . Diabetes  Mother   . Diabetes Father   . Diabetes      many family members   History   Social History  . Marital Status: Single    Spouse Name: N/A    Number of Children: N/A  . Years of Education: N/A   Social History Main Topics  . Smoking status: Former Smoker -- 0.50 packs/day for 20 years    Types: Cigarettes    Quit date: 05/26/2013  . Smokeless tobacco: Never Used  . Alcohol Use: No  . Drug Use: No  . Sexual Activity: No   Other Topics Concern  . None   Social History Narrative   Lives permanently in [place] with mom and stepfather.  Has not started working yet and was unemployed in [place].            Past Surgical History  Procedure Laterality Date  . Abdominal surgery  ~ 2007    some sort of pancreatic cyst drainage.   . Cholecystectomy      ~ age 39-laparoscopic  . Adenoidectomy    . 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 Eus N/A 04/14/2013    Procedure: UPPER  ENDOSCOPIC ULTRASOUND (EUS) LINEAR;  Surgeon: Rachael Fee, MD;  Location: [facility];  Service: Endoscopy;  Laterality: N/A;   Past Medical History  Diagnosis Date  . Hernia 03-24-13    ventral hernia remains   . GERD (gastroesophageal reflux disease)   . Pancreatitis 03-24-13    2002, 2'2013, 03-14-13  . Pancreatic cyst 2002 onset  . Anxiety   . Depression     "recent breakup with partner of 15 yrs"  . Adrenal insufficiency 04/23/2013    ??  . MS (multiple sclerosis)    BP 114/63 mmHg  Pulse 95  Resp 14  SpO2 96%  Opioid Risk Score:   Fall Risk Score: Low Fall Risk (0-5 points) Review of Systems  Constitutional: Positive for unexpected weight change.  Gastrointestinal: Positive for nausea, vomiting and abdominal pain.  Musculoskeletal:       Spasms  Neurological: Positive for weakness and numbness.       Tingling  Psychiatric/Behavioral: Positive for dysphoric mood. The patient is nervous/anxious.   All other systems reviewed and are  negative.      Objective:   Physical Exam  General: Alert and oriented x 3, No apparent distress. obese  HEENT: Head is normocephalic, atraumatic, PERRLA, EOMI, sclera anicteric, oral mucosa pink and moist, dentition intact, ext ear canals clear,  Neck: Supple without JVD or lymphadenopathy  Heart: Reg rate and rhythm. No murmurs rubs or gallops  Chest: CTA bilaterally without wheezes, rales, or rhonchi; no distress  Abdomen: Soft, non-tender, non-distended, bowel sounds positive.  Extremities: No clubbing, cyanosis, or edema. Pulses are 2+  Skin: Clean and intact without signs of breakdown  Neuro: Pt is cognitively appropriate with normal insight, memory, and awareness. Cranial nerves 2-12 are intact. Sensory exam is slightly decreased on the right. Reflexes are 3+ right and 2+ left. Strength 4/5 rue prox to distal. 3+ RLE in all muscle groups in inconsistent pattern. Fine motor coordination is decreased RLE and RUE. She struggles with clearance of the right foot during swing phase of gait. . No tremors. .  Musculoskeletal: decreased grip and ROM in the low back  Low back tender with flexion, extension and rotation. SLR + on right. Hamstrings tight. mid back paraspinals taut.  Psych: Pt's affect is appropriate. Pt is cooperative   Assessment & Plan:   1. Relapsing remitting MS  2. 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 Low back pain with ? Radiculopathy RLE  4. Chronic pancreatitis    Plan:  1. Increase topamax to  daily for headaches and neuropathic pain.  2. Tramadol #60 were written today to help with neuro/abd pain  3. Oxycodone was refilled.  4. Consider an MRI of her lumbar spine to assess nerve roots in the future.  5. Continue with AFO for gait and back stabilization.  6. Pancreatitis management per PCP and gastroenterologist. 7. Needs to see a gastroenterologist to manage her pancreatitis. I told her that we don't manage  visceral/GI related pain syndromes in this office.  8. Follow up with [name] in about a month with [name] or NP. 15 minutes of face to face patient care time were spent during this visit. All questions were encouraged and answered.

## 2014-09-26 MED ORDER — PROMETHAZINE HCL 25 MG/ML IJ SOLN
12.5000 mg | Freq: Once | INTRAMUSCULAR | Status: AC
  Administered 2014-09-26: 12.5 mg via INTRAVENOUS
  Filled 2014-09-26: qty 1

## 2014-09-26 MED ORDER — HYDROMORPHONE HCL 1 MG/ML IJ SOLN
1.0000 mg | Freq: Once | INTRAMUSCULAR | Status: AC
  Administered 2014-09-26: 1 mg via INTRAVENOUS
  Filled 2014-09-26: qty 1

## 2014-09-26 MED ORDER — SODIUM CHLORIDE 0.9 % IV BOLUS (SEPSIS)
1000.0000 mL | Freq: Once | INTRAVENOUS | Status: AC
  Administered 2014-09-26: 1000 mL via INTRAVENOUS

## 2014-09-26 MED ORDER — PROMETHAZINE HCL 25 MG PO TABS: 25.0000 mg | ORAL_TABLET | Freq: Four times a day (QID) | ORAL | Status: DC | PRN

## 2014-09-26 MED ORDER — PROMETHAZINE HCL 25 MG/ML IJ SOLN
12.5000 mg | Freq: Four times a day (QID) | INTRAMUSCULAR | Status: DC | PRN
Start: 2014-09-26 — End: 2014-09-26
  Filled 2014-09-26: qty 1

## 2014-09-26 MED ORDER — HYDROMORPHONE HCL 2 MG/ML IJ SOLN
1.5000 mg | Freq: Once | INTRAMUSCULAR | Status: AC
Start: 2014-09-26 — End: 2014-09-26
  Administered 2014-09-26: 1.5 mg via INTRAVENOUS
  Filled 2014-09-26: qty 1

## 2014-09-26 NOTE — Discharge Instructions (Signed)

## 2014-09-28 ENCOUNTER — Ambulatory Visit: Payer: Self-pay | Admitting: Gastroenterology

## 2014-09-29 ENCOUNTER — Encounter: Payer: Self-pay | Admitting: Gastroenterology

## 2014-09-29 ENCOUNTER — Ambulatory Visit (INDEPENDENT_AMBULATORY_CARE_PROVIDER_SITE_OTHER): Payer: Medicaid Other | Admitting: Gastroenterology

## 2014-09-29 VITALS — BP 120/76 | HR 88 | Ht 63.0 in | Wt 230.0 lb

## 2014-09-29 DIAGNOSIS — R112 Nausea with vomiting, unspecified: Secondary | ICD-10-CM

## 2014-09-29 DIAGNOSIS — R1013 Epigastric pain: Secondary | ICD-10-CM

## 2014-09-29 DIAGNOSIS — K863 Pseudocyst of pancreas: Secondary | ICD-10-CM

## 2014-09-29 MED ORDER — ONDANSETRON 4 MG PO TBDP: ORAL_TABLET | ORAL | Status: DC

## 2014-09-29 MED ORDER — PANTOPRAZOLE SODIUM 40 MG PO TBEC: 40.0000 mg | DELAYED_RELEASE_TABLET | Freq: Every day | ORAL | Status: DC

## 2014-09-29 MED ORDER — PROMETHAZINE HCL 25 MG PO TABS: 25.0000 mg | ORAL_TABLET | Freq: Four times a day (QID) | ORAL | Status: DC | PRN

## 2014-09-29 NOTE — Patient Instructions (Addendum)
You have been scheduled for an endoscopy. Please follow written instructions given to you at your visit today. If you use inhalers (even only as needed), please bring them with you on the day of your procedure.    We sent a prescription for Zofran ODT and Pantoprazole sodium 40 mg to [facility].   You have been scheduled for a CT scan of the abdomen and pelvis at [facility] ([address]---this is in the same building as Press [facility]).   You are scheduled on   Thursday. You should arrive at 1:15 PM  prior to your appointment time for registration. Please follow the written instructions below on the day of your exam:  WARNING: IF YOU ARE ALLERGIC TO IODINE/X-RAY DYE, PLEASE NOTIFY RADIOLOGY IMMEDIATELY AT [phone]! YOU WILL BE GIVEN A 13 HOUR PREMEDICATION PREP.  1) Do not eat or drink anything after  9:30 am (4 hours prior to your test) 2) You have been given 2 bottles of oral contrast to drink. The solution may taste   better if refrigerated, but do NOT add ice or any other liquid to this solution. Shake well before drinking.    Drink 1 bottle of contrast @ 11:30 am  (2 hours prior to your exam)  Drink 1 bottle of contrast @ 12:30 PM  (1 hour prior to your exam)  You may take any medications as prescribed with a small amount of water except for the following: Metformin, Glucophage, Glucovance, Avandamet, Riomet, Fortamet, Actoplus Met, Janumet, Glumetza or Metaglip. The above medications must be held the day of the exam AND 48 hours after the exam.  The purpose of you drinking the oral contrast is to aid in the visualization of your intestinal tract. The contrast solution may cause some diarrhea. Before your exam is started, you will be given a small amount of fluid to drink. Depending on your individual set of symptoms, you may also receive an intravenous injection of x-ray contrast/dye. Plan on being at [facility] for 30 minutes or long,  depending on the type of exam you are having performed.  If you have any questions regarding your exam or if you need to reschedule, you may call the CT department at [phone] between the hours of 8:00 am and 5:00 pm, Monday-Friday.  ________________________________________________________________________

## 2014-10-02 DIAGNOSIS — R112 Nausea with vomiting, unspecified: Secondary | ICD-10-CM | POA: Insufficient documentation

## 2014-10-02 DIAGNOSIS — K863 Pseudocyst of pancreas: Secondary | ICD-10-CM | POA: Insufficient documentation

## 2014-10-02 DIAGNOSIS — K869 Disease of pancreas, unspecified: Secondary | ICD-10-CM | POA: Insufficient documentation

## 2014-10-02 NOTE — Progress Notes (Signed)
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  She presented for endoscopic ultrasound and biopsy of her pancreatic cyst, which was done by Dr. Christella Hartigan on 04/14/2013. She had 16mL of thick clear fluid removed without complication and it was sent for cytology, amylase/lipase.  She was hospitalized on several occasions in 2014 for nausea and vomiting.  CT scan and MRI of her head/brain showed some abnormalities; she was seen by neurology, and has been diagnosed with MS.  She has been following with Dr. Everlena Cooper.  She was previously on Copaxone, but it does have risk of pancreatitis so that has since been discontinued and she is not currently on treatment.  At one point it was eventually determined that maybe her pain medications are causing her nausea, vomiting and these were decreased with resultant decrease in nausea and vomiting. She was also having diarrhea and a fecal elastase was extremely low, therefore, she was started on pancreatic enzymes and continues those at this point.  She was seen by Dr. Marilynn Rail at [facility] on one who determined that there was no need for surgery, etc for her pancreatic pseudocyst.  She has not been seen by our practice since 07/2013, but comes back here today to establish office care.  She says that she did not have insurance until recently.  She presents to the office today with complaints of ongoing nausea and vomiting along with epigastric abdominal pain.  She says that the nausea and vomiting she can deal with and manage with Zofran and phenergan, but the pain has been very severe.  She says that it has worsened a lot over the past 5 weeks.  Her mother was on speaker-phone for our visit today.  Her mother says that this has  been the worse that she has seen Mary [name] as far as her pain is concerned.  She has not been able to eat hardly any solid food in 5 weeks because she gets severe pain with even a couple of bites.  She has been drinking Boost and protein shakes and seems to tolerate those well.    CBC, CMP, and lipase were normal on 2/8.  Last CT scan on 06/15/2014 decreasing size of cystic structure in the tail of the pancreas without any acute abnormalities.  The patient does follow with pain management, Dr. Riley Kill, for her pain related to MS/neuropathic pain, however, she says that her regimen does not help with her abdominal pain.  Also, Dr. Riley Kill recent note says that he doe snot treat abdominal/visceral/GI pain.   Current Medications, Allergies, Past Medical History, Past Surgical History, Family History and Social History were reviewed in [employer] record.   Physical Exam: BP 120/76 mmHg  Pulse 88  Ht  (1.6 m)  Wt 230 lb (104.327 kg)  BMI 40.75 kg/m2  LMP 08/21/2014 (Approximate) General: Well developed white female in no acute distress Head: Normocephalic and atraumatic Eyes:  Sclerae anicteric, conjunctiva pink  Ears: Normal auditory acuity Lungs: Clear throughout to auscultation Heart: Regular rate and rhythm Abdomen: Soft, obese, non-distended.  Normal bowel sounds.  Moderate epigastric TTP without R/R/G. Musculoskeletal: Symmetrical with no gross deformities  Extremities: No edema  Neurological: Alert oriented x 4, grossly non-focal Psychological:  Alert and  cooperative. Normal mood and affect  Assessment and Recommendations: -Epigastric abdominal pain with nausea and vomiting in the setting of known pancreatic pseudocyst.  Pseudocyst actually decreasing in size on last CT scan 05/2014.  Lipase has been normal on several occasions recently.  ? Source of her symptoms.  Discussed with Dr. Marina Goodell.  We do not think that there is anything new or acute that is causing her  symptoms, including the pseudocyst, but her symptoms have worsened recently so we will repeat a CT scan of the abdomen and pelvis with contrast.  Will schedule EGD to rule out ulcer disease, etc as well.  She is already on pantoprazole 40 mg daily.  We will refill her zofran and phenergan.  She does follow-up with Dr. Riley Kill for pain management related to her MS/neuropathic pain, but says that her regimen does not help with her abdominal pain. -Pancreatic insufficiency: Is on pancreatic enzymes. -Anxiety and depression -MS:  Following with neurology, Dr. Everlena Cooper, but not currently on treatment.

## 2014-10-02 NOTE — Progress Notes (Signed)
Case reviewed with physician assistant. Agree with assessment and plans as outlined above 

## 2014-10-04 ENCOUNTER — Ambulatory Visit (AMBULATORY_SURGERY_CENTER): Payer: Medicaid Other | Admitting: Internal Medicine

## 2014-10-04 ENCOUNTER — Encounter: Payer: Self-pay | Admitting: Internal Medicine

## 2014-10-04 VITALS — BP 87/56 | HR 85 | Temp 97.8°F | Resp 17 | Ht 63.0 in | Wt 230.0 lb

## 2014-10-04 DIAGNOSIS — R1013 Epigastric pain: Secondary | ICD-10-CM

## 2014-10-04 DIAGNOSIS — K2100 Gastro-esophageal reflux disease with esophagitis, without bleeding: Secondary | ICD-10-CM

## 2014-10-04 MED ORDER — SODIUM CHLORIDE 0.9 % IV SOLN: 500.0000 mL | INTRAVENOUS | Status: DC

## 2014-10-04 NOTE — Patient Instructions (Signed)
Impressions/recommendations:  GERD with esophagitis  Continue pantoprazole 40 mg daily 30 minutes before breakfast daily. Keep appointment for CT Scan as scheduled. Ongoing pain management with your pain clinic.  YOU HAD AN ENDOSCOPIC PROCEDURE TODAY AT THE [facility]: Refer to the procedure report that was given to you for any specific questions about what was found during the examination.  If the procedure report does not answer your questions, please call your gastroenterologist to clarify.  If you requested that your care partner not be given the details of your procedure findings, then the procedure report has been included in a sealed envelope for you to review at your convenience later.  YOU SHOULD EXPECT: Some feelings of bloating in the abdomen. Passage of more gas than usual.  Walking can help get rid of the air that was put into your GI tract during the procedure and reduce the bloating. If you had a lower endoscopy (such as a colonoscopy or flexible sigmoidoscopy) you may notice spotting of blood in your stool or on the toilet paper. If you underwent a bowel prep for your procedure, then you may not have a normal bowel movement for a few days.  DIET: Your first meal following the procedure should be a light meal and then it is ok to progress to your normal diet.  A half-sandwich or bowl of soup is an example of a good first meal.  Heavy or fried foods are harder to digest and may make you feel nauseous or bloated.  Likewise meals heavy in dairy and vegetables can cause extra gas to form and this can also increase the bloating.  Drink plenty of fluids but you should avoid alcoholic beverages for 24 hours.  ACTIVITY: Your care partner should take you home directly after the procedure.  You should plan to take it easy, moving slowly for the rest of the day.  You can resume normal activity the day after the procedure however you should NOT DRIVE or use heavy machinery for 24 hours  (because of the sedation medicines used during the test).    SYMPTOMS TO REPORT IMMEDIATELY: A gastroenterologist can be reached at any hour.  During normal business hours, 8:30 AM to 5:00 PM Monday through Friday, call [phone].  After hours and on weekends, please call the GI answering service at [phone] who will take a message and have the physician on call contact you.   Following upper endoscopy (EGD)  Vomiting of blood or coffee ground material  New chest pain or pain under the shoulder blades  Painful or persistently difficult swallowing  New shortness of breath  Fever of 100F or higher  Black, tarry-looking stools  FOLLOW UP: If any biopsies were taken you will be contacted by phone or by letter within the next 1-3 weeks.  Call your gastroenterologist if you have not heard about the biopsies in 3 weeks.  Our staff will call the home number listed on your records the next business day following your procedure to check on you and address any questions or concerns that you may have at that time regarding the information given to you following your procedure. This is a courtesy call and so if there is no answer at the home number and we have not heard from you through the emergency physician on call, we will assume that you have returned to your regular daily activities without incident.  SIGNATURES/CONFIDENTIALITY: You and/or your care partner have signed paperwork which will be entered  into your electronic medical record.  These signatures attest to the fact that that the information above on your After Visit Summary has been reviewed and is understood.  Full responsibility of the confidentiality of this discharge information lies with you and/or your care-partner.

## 2014-10-04 NOTE — Progress Notes (Signed)
A/ox3 pleased with MAC, report to Tracy RN 

## 2014-10-04 NOTE — Op Note (Signed)
[facility] [address] [place], [address]   ENDOSCOPY PROCEDURE REPORT  PATIENT: Mary, [name]  MR#: [id number] BIRTHDATE: August 12, 1976 , 38  yrs. old GENDER: female ENDOSCOPIST: Roxy Cedar, MD REFERRED BY:  .  Self / Office PROCEDURE DATE:  10/04/2014 PROCEDURE:  EGD, diagnostic ASA CLASS:     Class II INDICATIONS:  abdominal pain and nausea. MEDICATIONS: Monitored anesthesia care and Propofol 150 mg IV TOPICAL ANESTHETIC: none  DESCRIPTION OF PROCEDURE: After the risks benefits and alternatives of the procedure were thoroughly explained, informed consent was obtained.  The LB [id number] endoscope was introduced through the mouth and advanced to the second portion of the duodenum , Without limitations.  The instrument was slowly withdrawn as the mucosa was fully examined.    EXAM:Esophagus revealed mild distal esophagitis.  The esophagus was otherwise normal.  Stomach was normal.  The duodenum was normal. Retroflexed views revealed no abnormalities.     The scope was then withdrawn from the patient and the procedure completed.  COMPLICATIONS: There were no immediate complications.  ENDOSCOPIC IMPRESSION: 1. GERD with mild esophagitis 2. Otherwise normal EGD  RECOMMENDATIONS: 1.  Anti-reflux regimen to be followed (please follow the educational brochure provided by the nurse) 2.  Continue pantoprazole 40 mg daily. Take 30 minutes before first meal in a.m. 3.  Keep appointment for CT Scan as already scheduled. We will call you when the results are available 4. Ongoing pain management with your pain clinic  REPEAT EXAM:  eSigned:  Roxy Cedar, MD 10/04/2014 1:17 PM    CC:The Patient and Zoe Lan MD

## 2014-10-05 ENCOUNTER — Inpatient Hospital Stay: Admission: RE | Admit: 2014-10-05 | Payer: Self-pay | Source: Ambulatory Visit

## 2014-10-05 ENCOUNTER — Telehealth: Payer: Self-pay | Admitting: *Deleted

## 2014-10-05 NOTE — Telephone Encounter (Signed)
  Follow up Call-  Call back number 10/04/2014  Post procedure Call Back phone  # [phone]  Permission to leave phone message Yes    [facility]

## 2014-10-10 ENCOUNTER — Inpatient Hospital Stay: Admission: RE | Admit: 2014-10-10 | Payer: Self-pay | Source: Ambulatory Visit

## 2014-10-11 NOTE — ED Provider Notes (Signed)
CSN: [id number]     Arrival date & time 09/25/14  2132 History   First MD Initiated Contact with Patient 09/26/14 0059     Chief Complaint  Patient presents with  . Abdominal Pain  . Emesis     (Consider location/radiation/quality/duration/timing/severity/associated sxs/prior Treatment) HPI   39 year old female with abdominal pain. Patient has a past history of chronic pancreatitis. She states the current symptoms feel similar to this. Pain is in the upper abdomen. Worsening epigastrium. Acutely worse around 5:30 PM today. Associated with nausea and vomiting. No fevers or chills. No urinary complaints. No diarrhea.   Past Medical History  Diagnosis Date  . Hernia 03-24-13    ventral hernia remains   . GERD (gastroesophageal reflux disease)   . Pancreatitis 03-24-13    2002, 2'2013, 03-14-13  . Pancreatic cyst 2002 onset  . Anxiety   . Depression     "recent breakup with partner of 15 yrs"  . Adrenal insufficiency 04/23/2013    ??  . MS (multiple sclerosis)    Past Surgical History  Procedure Laterality Date  . Abdominal surgery  ~ 2007    some sort of pancreatic cyst drainage.   . Cholecystectomy      ~ age 3-laparoscopic  . Adenoidectomy    . 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 Eus N/A 04/14/2013    Procedure: UPPER ENDOSCOPIC ULTRASOUND (EUS) LINEAR;  Surgeon: Rachael Fee, MD;  Location: [facility];  Service: Endoscopy;  Laterality: N/A;   Family History  Problem Relation Age of Onset  . Lupus Neg Hx   . Sarcoidosis Neg Hx   . Pancreatitis Neg Hx   . Ataxia Neg Hx   . Chorea Neg Hx   . Dementia Neg Hx   . Mental retardation Neg Hx   . Migraines Neg Hx   . Multiple sclerosis Neg Hx   . Neurofibromatosis Neg Hx   . Neuropathy Neg Hx   . Parkinsonism Neg Hx   . Seizures Neg Hx   . Stroke Neg Hx   . Colitis Maternal Aunt   . Diabetes Mother   . Diabetes Father   . Diabetes      many family members    History  Substance Use Topics  . Smoking status: Former Smoker -- 0.50 packs/day for 20 years    Types: Cigarettes    Quit date: 05/26/2013  . Smokeless tobacco: Never Used  . Alcohol Use: No   OB History    No data available     Review of Systems  All systems reviewed and negative, other than as noted in HPI.   Allergies  Amoxicillin; Penicillins; Latex; Morphine and related; and Buprenorphine hcl  Home Medications   Prior to Admission medications   Medication Sig Start Date End Date Taking? Authorizing Provider  cyclobenzaprine (FLEXERIL) 5 MG tablet Take 1 tablet (5 mg total) by mouth 3 (three) times daily as needed for muscle spasms. 09/25/14  Yes Ranelle Oyster, MD  lipase/protease/amylase (CREON-12/PANCREASE) 12000 UNITS CPEP capsule Take 2 capsules by mouth 3 (three) times daily with meals. 07/31/13  Yes Catarina Hartshorn, MD  Oxycodone HCl 10 MG TABS Take 1 tablet (10 mg total) by mouth every 6 (six) hours as needed (severe pain). 09/25/14  Yes Ranelle Oyster, MD  topiramate (TOPAMAX) 100 MG tablet Take 1 tablet (100 mg total) by mouth daily. 09/25/14  Yes Ranelle Oyster, MD  traMADol (ULTRAM) 50 MG tablet Take 1 tablet (50 mg total) by mouth every 6 (six) hours as needed. 09/25/14  Yes Ranelle Oyster, MD  ondansetron (ZOFRAN ODT) 4 MG disintegrating tablet  ODT q4 hours prn nausea/vomit 09/29/14   Jessica D. Zehr, PA-C  pantoprazole (PROTONIX) 40 MG tablet Take 1 tablet (40 mg total) by mouth daily. 09/29/14   Shanda Bumps D. Zehr, PA-C  promethazine (PHENERGAN) 25 MG tablet Take 1 tablet (25 mg total) by mouth every 6 (six) hours as needed for nausea or vomiting. 09/29/14   Princella Pellegrini. Zehr, PA-C  Triamcinolone Acetonide (TRIAMCINOLONE 0.1 % CREAM : EUCERIN) CREA Apply 1 application topically 3 (three) times daily. Patient not taking: Reported on 10/04/2014 02/23/14   Evlyn Kanner Love, PA-C   BP 101/60 mmHg  Pulse 89  Temp(Src) 98 F (36.7 C) (Oral)  Resp 16  Ht  (1.626 m)   SpO2 98%  LMP 08/21/2014 (Approximate) Physical Exam  Constitutional: She appears well-developed and well-nourished. No distress.  HENT:  Head: Normocephalic and atraumatic.  Eyes: Conjunctivae are normal. Right eye exhibits no discharge. Left eye exhibits no discharge.  Neck: Neck supple.  Cardiovascular: Normal rate, regular rhythm and normal heart sounds.  Exam reveals no gallop and no friction rub.   No murmur heard. Pulmonary/Chest: Effort normal and breath sounds normal. No respiratory distress.  Abdominal: Soft. She exhibits no distension. There is tenderness.  Epigastric tenderness with voluntary guarding. No rebound. Does not seem to be distended.  Musculoskeletal: She exhibits no edema or tenderness.  Neurological: She is alert.  Skin: Skin is warm and dry.  Psychiatric: She has a normal mood and affect. Her behavior is normal. Thought content normal.  Nursing note and vitals reviewed.   ED Course  Procedures (including critical care time) Labs Review Labs Reviewed  CBC WITH DIFFERENTIAL/PLATELET - Abnormal; Notable for the following:    Monocytes Relative 13 (*)    All other components within normal limits  COMPREHENSIVE METABOLIC PANEL - Abnormal; Notable for the following:    Glucose, Bld 144 (*)    All other components within normal limits  URINALYSIS, ROUTINE W REFLEX MICROSCOPIC - Abnormal; Notable for the following:    Bilirubin Urine SMALL (*)    Leukocytes, UA TRACE (*)    All other components within normal limits  LIPASE, BLOOD  URINE MICROSCOPIC-ADD ON    Imaging Review No results found.   EKG Interpretation None      MDM   Final diagnoses:  Epigastric pain  Nausea    39 year old female with abdominal pain which appears to be chronic in nature. Similar to previous exacerbations. She reports chronic pancreatitis. Workup is fairly unremarkable. Afebrile. He dynamically stable. Symptomatically improved. Low suspicion for emergent process. I feel  stable for discharge at this time. Return precautions were discussed.    Raeford Razor, MD 10/11/14 2202

## 2014-10-18 ENCOUNTER — Ambulatory Visit (INDEPENDENT_AMBULATORY_CARE_PROVIDER_SITE_OTHER)
Admission: RE | Admit: 2014-10-18 | Discharge: 2014-10-18 | Disposition: A | Discharge status: Home / Self Care | Payer: Medicaid Other | Source: Ambulatory Visit | Attending: Gastroenterology | Admitting: Gastroenterology

## 2014-10-18 DIAGNOSIS — K863 Pseudocyst of pancreas: Secondary | ICD-10-CM

## 2014-10-18 DIAGNOSIS — R1013 Epigastric pain: Secondary | ICD-10-CM

## 2014-10-18 MED ORDER — IOHEXOL 300 MG/ML  SOLN
80.0000 mL | Freq: Once | INTRAMUSCULAR | Status: AC | PRN
  Administered 2014-10-18: 80 mL via INTRAVENOUS

## 2014-10-19 ENCOUNTER — Emergency Department (HOSPITAL_COMMUNITY): Payer: Medicaid Other

## 2014-10-19 ENCOUNTER — Emergency Department (HOSPITAL_COMMUNITY)
Admission: EM | Admit: 2014-10-19 | Discharge: 2014-10-20 | Disposition: A | Discharge status: Home / Self Care | Payer: Medicaid Other | Attending: Emergency Medicine | Admitting: Emergency Medicine

## 2014-10-19 ENCOUNTER — Encounter (HOSPITAL_COMMUNITY): Payer: Self-pay | Admitting: Emergency Medicine

## 2014-10-19 DIAGNOSIS — M6281 Muscle weakness (generalized): Secondary | ICD-10-CM | POA: Diagnosis present

## 2014-10-19 DIAGNOSIS — Z880 Allergy status to penicillin: Secondary | ICD-10-CM | POA: Insufficient documentation

## 2014-10-19 DIAGNOSIS — F419 Anxiety disorder, unspecified: Secondary | ICD-10-CM | POA: Insufficient documentation

## 2014-10-19 DIAGNOSIS — G35 Multiple sclerosis: Secondary | ICD-10-CM | POA: Insufficient documentation

## 2014-10-19 DIAGNOSIS — K219 Gastro-esophageal reflux disease without esophagitis: Secondary | ICD-10-CM | POA: Diagnosis not present

## 2014-10-19 DIAGNOSIS — Z91040 Latex allergy status: Secondary | ICD-10-CM | POA: Insufficient documentation

## 2014-10-19 DIAGNOSIS — R531 Weakness: Secondary | ICD-10-CM

## 2014-10-19 DIAGNOSIS — Z87891 Personal history of nicotine dependence: Secondary | ICD-10-CM | POA: Diagnosis not present

## 2014-10-19 DIAGNOSIS — R29818 Other symptoms and signs involving the nervous system: Secondary | ICD-10-CM

## 2014-10-19 DIAGNOSIS — F447 Conversion disorder with mixed symptom presentation: Secondary | ICD-10-CM | POA: Diagnosis present

## 2014-10-19 DIAGNOSIS — Z79899 Other long term (current) drug therapy: Secondary | ICD-10-CM | POA: Insufficient documentation

## 2014-10-19 DIAGNOSIS — R9089 Other abnormal findings on diagnostic imaging of central nervous system: Secondary | ICD-10-CM | POA: Insufficient documentation

## 2014-10-19 DIAGNOSIS — G988 Other disorders of nervous system: Secondary | ICD-10-CM

## 2014-10-19 DIAGNOSIS — F329 Major depressive disorder, single episode, unspecified: Secondary | ICD-10-CM | POA: Diagnosis not present

## 2014-10-19 DIAGNOSIS — G9349 Other encephalopathy: Secondary | ICD-10-CM | POA: Diagnosis not present

## 2014-10-19 DIAGNOSIS — M6289 Other specified disorders of muscle: Secondary | ICD-10-CM | POA: Diagnosis not present

## 2014-10-19 DIAGNOSIS — IMO0002 Reserved for concepts with insufficient information to code with codable children: Secondary | ICD-10-CM

## 2014-10-19 LAB — BASIC METABOLIC PANEL
Anion gap: 6 (ref 5–15)
BUN: 15 mg/dL (ref 6–23)
CALCIUM: 9.6 mg/dL (ref 8.4–10.5)
CO2: 24 mmol/L (ref 19–32)
Chloride: 108 mmol/L (ref 96–112)
Creatinine, Ser: 0.76 mg/dL (ref 0.50–1.10)
Glucose, Bld: 93 mg/dL (ref 70–99)
Potassium: 4 mmol/L (ref 3.5–5.1)
Sodium: 138 mmol/L (ref 135–145)

## 2014-10-19 LAB — CBC WITH DIFFERENTIAL/PLATELET
BASOS ABS: 0 10*3/uL (ref 0.0–0.1)
Basophils Relative: 0 % (ref 0–1)
EOS ABS: 0.1 10*3/uL (ref 0.0–0.7)
Eosinophils Relative: 1 % (ref 0–5)
HEMATOCRIT: 39.9 % (ref 36.0–46.0)
HEMOGLOBIN: 13.5 g/dL (ref 12.0–15.0)
LYMPHS PCT: 25 % (ref 12–46)
Lymphs Abs: 2.1 10*3/uL (ref 0.7–4.0)
MCH: 28.4 pg (ref 26.0–34.0)
MCHC: 33.8 g/dL (ref 30.0–36.0)
MCV: 83.8 fL (ref 78.0–100.0)
MONOS PCT: 9 % (ref 3–12)
Monocytes Absolute: 0.7 10*3/uL (ref 0.1–1.0)
Neutro Abs: 5.5 10*3/uL (ref 1.7–7.7)
Neutrophils Relative %: 65 % (ref 43–77)
Platelets: 276 10*3/uL (ref 150–400)
RBC: 4.76 MIL/uL (ref 3.87–5.11)
RDW: 14.5 % (ref 11.5–15.5)
WBC: 8.5 10*3/uL (ref 4.0–10.5)

## 2014-10-19 MED ORDER — ALPRAZOLAM 0.5 MG PO TABS
0.5000 mg | ORAL_TABLET | Freq: Once | ORAL | Status: AC | PRN
  Administered 2014-10-19: 0.5 mg via ORAL
  Filled 2014-10-19: qty 1

## 2014-10-19 NOTE — ED Notes (Signed)
Pt still in MRI 

## 2014-10-19 NOTE — ED Provider Notes (Signed)
11:20 PM Patient signed out to me by Mary Pel, PA-C. Patient will have MRI. If normal, patient will have social work and case management consult in the morning.   5:37 AM Patient's MRI shows stable white matter changes. No acute findings. Patient will wait for Case Management and social work consult due to her inability to ambulate.   Results for orders placed or performed during the hospital encounter of 10/19/14  CBC with Differential/Platelet  Result Value Ref Range   WBC 8.5 4.0 - 10.5 K/uL   RBC 4.76 3.87 - 5.11 MIL/uL   Hemoglobin 13.5 12.0 - 15.0 g/dL   HCT 40.9 81.1 - 91.4 %   MCV 83.8 78.0 - 100.0 fL   MCH 28.4 26.0 - 34.0 pg   MCHC 33.8 30.0 - 36.0 g/dL   RDW 78.2 95.6 - 21.3 %   Platelets 276 150 - 400 K/uL   Neutrophils Relative % 65 43 - 77 %   Neutro Abs 5.5 1.7 - 7.7 K/uL   Lymphocytes Relative 25 12 - 46 %   Lymphs Abs 2.1 0.7 - 4.0 K/uL   Monocytes Relative 9 3 - 12 %   Monocytes Absolute 0.7 0.1 - 1.0 K/uL   Eosinophils Relative 1 0 - 5 %   Eosinophils Absolute 0.1 0.0 - 0.7 K/uL   Basophils Relative 0 0 - 1 %   Basophils Absolute 0.0 0.0 - 0.1 K/uL  Basic metabolic panel  Result Value Ref Range   Sodium 138 135 - 145 mmol/L   Potassium 4.0 3.5 - 5.1 mmol/L   Chloride 108 96 - 112 mmol/L   CO2 24 19 - 32 mmol/L   Glucose, Bld 93 70 - 99 mg/dL   BUN 15 6 - 23 mg/dL   Creatinine, Ser 0.86 0.50 - 1.10 mg/dL   Calcium 9.6 8.4 - 57.8 mg/dL   GFR calc non Af Amer >90 >90 mL/min   GFR calc Af Amer >90 >90 mL/min   Anion gap 6 5 - 15   Mr Laqueta Jean Wo Contrast  10/20/2014   EXAM: MRI HEAD WITHOUT AND WITH CONTRAST  TECHNIQUE: Multiplanar, multiecho pulse sequences of the brain and surrounding structures were obtained without and with intravenous contrast.  CONTRAST:  20mL MULTIHANCE GADOBENATE DIMEGLUMINE 529 MG/ML IV SOLN  COMPARISON:  Prior MRI from 02/12/2014.  FINDINGS: Cerebral volume is stable from previous study.  Widespread abnormal cerebral white  matter T2 and FLAIR hyperintensity again seen. Involvement of the pons and right cerebellar hemisphere again noted. Comparing sagittal FLAIR sequence, overall, the extent of disease is not significantly changed relative to 6/20 8/15. No lesions demonstrate restricted diffusion or abnormal enhancement to suggest active demyelination.  No restricted diffusion to suggest acute infarct. Gray-white matter differentiation maintained. Normal intravascular flow voids present. No acute or chronic intracranial hemorrhage.  No midline shift or mass effect. No mass lesion. Ventricles are normal in size without evidence of hydrocephalus. No extra-axial fluid collection.  Craniocervical junction normal. Pituitary gland within normal limits.  No acute abnormality seen about the orbits.  Paranasal sinuses and mastoid air cells are clear.  Bone marrow signal intensity is normal. No scalp soft tissue abnormality.  IMPRESSION: 1. Stable appearance of abnormal cerebral, pontine, and right cerebellar white matter as compared to prior study from 02/12/2014. Findings are nonspecific, but may reflect sequelae of underlying multiple sclerosis. No new lesions or evidence of active demyelination. 2. No other acute intracranial abnormality.   Electronically Signed   By:  Rise Mu M.D.   On: 10/20/2014 04:48   Ct Abdomen Pelvis W Contrast  10/18/2014   CLINICAL DATA:  Epigastric pain. Nausea and vomiting for the past few weeks. Postprandial. Cholecystectomy as a child. History of pancreatitis. Recent normal endoscopy. Pancreatic pseudocyst.  EXAM: CT ABDOMEN AND PELVIS WITH CONTRAST  TECHNIQUE: Multidetector CT imaging of the abdomen and pelvis was performed using the standard protocol following bolus administration of intravenous contrast.  CONTRAST:  59mL OMNIPAQUE IOHEXOL 300 MG/ML  SOLN  COMPARISON:  06/15/2014  FINDINGS: Lower chest: Clear lung bases. Normal heart size without pericardial or pleural effusion. Mildly prominent  periesophageal nodes are unchanged and favored to be reactive.  Hepatobiliary: Moderate to marked enlargement of the caudate lobe is chronic. No specific evidence of cirrhosis. No focal liver lesion. Cholecystectomy, without biliary ductal dilatation.  Pancreas: Pancreatic atrophy. No duct dilatation. Vague hypo enhancing "Lesion" within the posterior pancreatic body/ tail junction measures 2.5 x 1.9 cm on image 27. At the site of a 3.0 x 2.5 cm lesion on 06/15/2014. No surrounding inflammation.  Spleen: Normal  Adrenals/Urinary Tract: Normal right adrenal gland. The left adrenal nodule is similar over multiple prior exams, 1.6 cm. Low-density on kidney delayed images, strongly favoring an adenoma.  Normal kidneys, without hydronephrosis.  Normal urinary bladder.  Stomach/Bowel: Normal stomach, without wall thickening. Colonic stool burden suggests constipation. Normal colon, appendix, Normal terminal ileum and appendix. Prior enterotomy. Mild prominence of small bowel loops in this area, including on image 40. Likely due to postoperative atony. No bowel obstruction.  Vascular/Lymphatic: Normal caliber of the aorta and branch vessels. Multiple small retroperitoneal nodes are similar.  Right external iliac adenopathy is similar at 1.5 cm.  Reproductive: Normal uterus and adnexa.  Other: No significant free fluid. Fat containing ventral abdominal wall hernias.  Musculoskeletal: No acute osseous abnormality.  IMPRESSION: 1. Further decrease in size of a pancreatic body/tail junction cystic lesion. Although technically indeterminate, ongoing regression suggests a resolving pseudocyst. No acute inflammation seen. 2. Possible constipation. 3. 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 4. Ventral abdominal wall hernias containing fat, as before. 5. Left adrenal nodule, similar and strongly favored to represent an adenoma.   Electronically Signed   By: Jeronimo Greaves M.D.   On: 10/18/2014 16:52      Emilia Beck, PA-C 10/21/14 2426  Tomasita Crumble, MD 10/21/14 1349

## 2014-10-19 NOTE — ED Provider Notes (Signed)
CSN: [id number]     Arrival date & time 10/19/14  1748 History   First MD Initiated Contact with Patient 10/19/14 2017     Chief Complaint  Patient presents with  . MS exacerbation    (Consider location/radiation/quality/duration/timing/severity/associated sxs/prior Treatment) HPI   PCP: [name], NP Blood pressure 113/85, pulse 106, temperature 97.8 F (36.6 C), resp. rate 17, weight 214 lb 7 oz (97.268 kg), last menstrual period 10/04/2014, SpO2 97 %.  Mary [name] is a 39 y.o.female with a significant PMH of pancreatitis, MS, anxiety, depression, GERD, hernia presents to the ER with complaints of multiple sclerosis exacerbation. She has seen Dr. Adriana Mccallum and a specialist at [facility]  have determine that the patient symptoms appear to be consistent with psychogenic causes. The patient is upset by this but says Dr. Adriana Mccallum has appropriately diagnosed her with MS. She was previously on Copazone but it was discontinued. She reports waking up this morning and having paralysis to the right leg and and right arm, stuttering and slow thinking. She reports not having an exacerbation similar to this since she was diagnosed last year.  She reports not coming in sooner because she thought her symptoms would get better. She is unable to walk due to the right sided weakness.   Negative Review of Symptoms: change in vision, confusion, headache, neck pain, fevers, abdominal pain, CP, SOB, lower extremity swelling, dysuria, vaginal bleeding/discharge.   Past Medical History  Diagnosis Date  . Hernia 03-24-13    ventral hernia remains   . GERD (gastroesophageal reflux disease)   . Pancreatitis 03-24-13    2002, 2'2013, 03-14-13  . Pancreatic cyst 2002 onset  . Anxiety   . Depression     "recent breakup with partner of 15 yrs"  . Adrenal insufficiency 04/23/2013    ??  . MS (multiple sclerosis)    Past Surgical History  Procedure Laterality Date  . Abdominal surgery  ~ 2007    some sort of  pancreatic cyst drainage.   . Cholecystectomy      ~ age 65-laparoscopic  . Adenoidectomy    . 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 Eus N/A 04/14/2013    Procedure: UPPER ENDOSCOPIC ULTRASOUND (EUS) LINEAR;  Surgeon: Rachael Fee, MD;  Location: [facility];  Service: Endoscopy;  Laterality: N/A;   Family History  Problem Relation Age of Onset  . Lupus Neg Hx   . Sarcoidosis Neg Hx   . Pancreatitis Neg Hx   . Ataxia Neg Hx   . Chorea Neg Hx   . Dementia Neg Hx   . Mental retardation Neg Hx   . Migraines Neg Hx   . Multiple sclerosis Neg Hx   . Neurofibromatosis Neg Hx   . Neuropathy Neg Hx   . Parkinsonism Neg Hx   . Seizures Neg Hx   . Stroke Neg Hx   . Colitis Maternal Aunt   . Diabetes Mother   . Diabetes Father   . Diabetes      many family members   History  Substance Use Topics  . Smoking status: Former Smoker -- 0.50 packs/day for 20 years    Types: Cigarettes    Quit date: 05/26/2013  . Smokeless tobacco: Never Used  . Alcohol Use: No   OB History    No data available     Review of Systems 10 Systems reviewed and are negative for  acute change except as noted in the HPI.    Allergies  Amoxicillin; Penicillins; Latex; Morphine and related; and Buprenorphine hcl  Home Medications   Prior to Admission medications   Medication Sig Start Date End Date Taking? Authorizing Provider  cyclobenzaprine (FLEXERIL) 5 MG tablet Take 1 tablet (5 mg total) by mouth 3 (three) times daily as needed for muscle spasms. 09/25/14  Yes Ranelle Oyster, MD  lipase/protease/amylase (CREON-12/PANCREASE) 12000 UNITS CPEP capsule Take 2 capsules by mouth 3 (three) times daily with meals. 07/31/13  Yes Catarina Hartshorn, MD  ondansetron (ZOFRAN ODT) 4 MG disintegrating tablet 4mg  ODT q4 hours prn nausea/vomit 09/29/14  Yes Jessica D. Zehr, PA-C  Oxycodone HCl 10 MG TABS Take 1 tablet (10 mg total) by mouth every 6 (six) hours as needed  (severe pain). 09/25/14  Yes Ranelle Oyster, MD  pantoprazole (PROTONIX) 40 MG tablet Take 1 tablet (40 mg total) by mouth daily. 09/29/14  Yes Jessica D. Zehr, PA-C  promethazine (PHENERGAN) 25 MG tablet Take 1 tablet (25 mg total) by mouth every 6 (six) hours as needed for nausea or vomiting. 09/29/14  Yes Shanda Bumps D. Zehr, PA-C  topiramate (TOPAMAX) 100 MG tablet Take 1 tablet (100 mg total) by mouth daily. 09/25/14  Yes Ranelle Oyster, MD  traMADol (ULTRAM) 50 MG tablet Take 1 tablet (50 mg total) by mouth every 6 (six) hours as needed. Patient taking differently: Take 50 mg by mouth every 6 (six) hours as needed for moderate pain.  09/25/14  Yes Ranelle Oyster, MD  Triamcinolone Acetonide (TRIAMCINOLONE 0.1 % CREAM : EUCERIN) CREA Apply 1 application topically 3 (three) times daily. Patient not taking: Reported on 10/04/2014 02/23/14   Evlyn Kanner Love, PA-C   BP 109/67 mmHg  Pulse 91  Temp(Src) 97.8 F (36.6 C)  Resp 22  Wt 214 lb 7 oz (97.268 kg)  SpO2 100%  LMP 10/04/2014 Physical Exam  Constitutional: She appears well-developed and well-nourished. No distress.  HENT:  Head: Normocephalic and atraumatic.  Eyes: Pupils are equal, round, and reactive to light.  Neck: Normal range of motion. Neck supple.  Cardiovascular: Normal rate and regular rhythm.   Pulmonary/Chest: Effort normal.  Abdominal: Soft.  Neurological: She is alert.  Pt unable to lift right leg off the bed. She is able to push her toes against my fingers on the right foot. She is unable to raise her right arm off the bed and has decreased grip strength on the right. The left arm and left leg show no deficits.  Patient has intact sensation to touch to her face. Cranial nerves are within normal limits. Patient stutters when she speaks.  Skin: Skin is warm and dry.  Nursing note and vitals reviewed.   ED Course  Procedures (including critical care time) Labs Review Labs Reviewed  CBC WITH DIFFERENTIAL/PLATELET  BASIC  METABOLIC PANEL    Imaging Review Ct Abdomen Pelvis W Contrast  10/18/2014   CLINICAL DATA:  Epigastric pain. Nausea and vomiting for the past few weeks. Postprandial. Cholecystectomy as a child. History of pancreatitis. Recent normal endoscopy. Pancreatic pseudocyst.  EXAM: CT ABDOMEN AND PELVIS WITH CONTRAST  TECHNIQUE: Multidetector CT imaging of the abdomen and pelvis was performed using the standard protocol following bolus administration of intravenous contrast.  CONTRAST:  37mL OMNIPAQUE IOHEXOL 300 MG/ML  SOLN  COMPARISON:  06/15/2014  FINDINGS: Lower chest: Clear lung bases. Normal heart size without pericardial or pleural effusion. Mildly prominent periesophageal nodes are unchanged and  favored to be reactive.  Hepatobiliary: Moderate to marked enlargement of the caudate lobe is chronic. No specific evidence of cirrhosis. No focal liver lesion. Cholecystectomy, without biliary ductal dilatation.  Pancreas: Pancreatic atrophy. No duct dilatation. Vague hypo enhancing "Lesion" within the posterior pancreatic body/ tail junction measures 2.5 x 1.9 cm on image 27. At the site of a 3.0 x 2.5 cm lesion on 06/15/2014. No surrounding inflammation.  Spleen: Normal  Adrenals/Urinary Tract: Normal right adrenal gland. The left adrenal nodule is similar over multiple prior exams, 1.6 cm. Low-density on kidney delayed images, strongly favoring an adenoma.  Normal kidneys, without hydronephrosis.  Normal urinary bladder.  Stomach/Bowel: Normal stomach, without wall thickening. Colonic stool burden suggests constipation. Normal colon, appendix, Normal terminal ileum and appendix. Prior enterotomy. Mild prominence of small bowel loops in this area, including on image 40. Likely due to postoperative atony. No bowel obstruction.  Vascular/Lymphatic: Normal caliber of the aorta and branch vessels. Multiple small retroperitoneal nodes are similar.  Right external iliac adenopathy is similar at 1.5 cm.  Reproductive:  Normal uterus and adnexa.  Other: No significant free fluid. Fat containing ventral abdominal wall hernias.  Musculoskeletal: No acute osseous abnormality.  IMPRESSION: 1. Further decrease in size of a pancreatic body/tail junction cystic lesion. Although technically indeterminate, ongoing regression suggests a resolving pseudocyst. No acute inflammation seen. 2. Possible constipation. 3. 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 4. Ventral abdominal wall hernias containing fat, as before. 5. Left adrenal nodule, similar and strongly favored to represent an adenoma.   Electronically Signed   By: Jeronimo Greaves M.D.   On: 10/18/2014 16:52     EKG Interpretation None      MDM   Final diagnoses:  Right sided weakness    I discussed the case with the neurologist Dr. Hosie Poisson who has seen patient as well.   Dr. Hosie Poisson the neurologist feels that due to patients inability to walk likely will need admission for physical therapy. He recommends holding off on IV solumedrol at this time pending MRI which he recommends delaying the MRI for tomorrow. -check MRI brain with and without contrast -PT/OT/SLT evaluation -consider psychiatry evaluation  10:13 pm Labs unremarkable, will admit to [facility] for [facility]. - spoke with Dr. Julian Reil with [facility] who says that MRI is now available 24/7 and that he will do the MRI now and consult on the patient. Dr. Julian Reil has ordered MRI of the brain, if anything NEW shows up, call Dr. Julian Reil back for admission. Otherwise, she will need case manager and social worker in the morning for placement and PT/OT as patient will not walk. She would also benefit a psychiatric consult but I do not believe the patient will be amenable to this.  Patient handed off to the evning midlevel, [employer], [employer].  Dorthula Matas, PA-C 10/19/14 2236  Juliet Rude. Rubin Payor, MD 10/20/14 [id number]

## 2014-10-19 NOTE — ED Notes (Signed)
Admitting MD at BS.  

## 2014-10-19 NOTE — Consult Note (Signed)
Consult Reason for Consult:weakness and stuttering speech  Referring Physician: Dr Rubin Payor  CC: weakness and stuttering speech  HPI: Mary [name] is an 39 y.o. female hx of anxiety, depression presenting for evaluation of weakness, stuttering speech which she reports is a MS exacerbation.  She has been followed by Dr Everlena Cooper of [facility] neurology with a full MS workup. Initially tried on copaxone, could not tolerate. He ultimately referred her to [facility] for a 2nd opinion over concern of a functional diagnosis. She was evaluated in 06/2014 and given a diagnosis of a functional neurologic disorder.   Past Medical History  Diagnosis Date  . Hernia 03-24-13    ventral hernia remains   . GERD (gastroesophageal reflux disease)   . Pancreatitis 03-24-13    2002, 2'2013, 03-14-13  . Pancreatic cyst 2002 onset  . Anxiety   . Depression     "recent breakup with partner of 15 yrs"  . Adrenal insufficiency 04/23/2013    ??  . MS (multiple sclerosis)     Past Surgical History  Procedure Laterality Date  . Abdominal surgery  ~ 2007    some sort of pancreatic cyst drainage.   . Cholecystectomy      ~ age 67-laparoscopic  . Adenoidectomy    . 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 Eus N/A 04/14/2013    Procedure: UPPER ENDOSCOPIC ULTRASOUND (EUS) LINEAR;  Surgeon: Rachael Fee, MD;  Location: [facility];  Service: Endoscopy;  Laterality: N/A;    Family History  Problem Relation Age of Onset  . Lupus Neg Hx   . Sarcoidosis Neg Hx   . Pancreatitis Neg Hx   . Ataxia Neg Hx   . Chorea Neg Hx   . Dementia Neg Hx   . Mental retardation Neg Hx   . Migraines Neg Hx   . Multiple sclerosis Neg Hx   . Neurofibromatosis Neg Hx   . Neuropathy Neg Hx   . Parkinsonism Neg Hx   . Seizures Neg Hx   . Stroke Neg Hx   . Colitis Maternal Aunt   . Diabetes Mother   . Diabetes Father   . Diabetes      many family members    Social  History:  reports that she quit smoking about 16 months ago. Her smoking use included Cigarettes. She has a 10 pack-year smoking history. She has never used smokeless tobacco. She reports that she does not drink alcohol or use illicit drugs.  Allergies  Allergen Reactions  . Amoxicillin Anaphylaxis  . Penicillins Anaphylaxis  . Latex Itching  . Morphine And Related Nausea And Vomiting  . Buprenorphine Hcl Nausea And Vomiting    Medications: I have reviewed the patient's current medications.   ROS: Out of a complete 14 system review, the patient complains of only the following symptoms, and all other reviewed systems are negative. +weakness, speech difficulty  Physical Examination: Filed Vitals:   10/19/14 2009  BP:   Pulse: 106  Temp:   Resp: 17   Physical Exam  Constitutional: He appears well-developed and well-nourished.  Psych: Affect appropriate to situation Eyes: No scleral injection HENT: No OP obstrucion Head: Normocephalic.  Cardiovascular: Normal rate and regular rhythm.  Respiratory: Effort normal and breath sounds normal.  GI: Soft. Bowel sounds are normal. No distension. There is no tenderness.  Skin: WDI  Neurologic Examination Mental Status: Alert, oriented, thought content appropriate.  Speech fluent  without evidence of aphasia. Marked stuttering which improves with distraction.  Able to follow 3 step commands without difficulty. Cranial Nerves: II: funduscopic exam wnl bilaterally, visual fields grossly normal, pupils equal, round, reactive to light and accommodation III,IV, VI: ptosis not present, extra-ocular motions intact bilaterally V,VII: smile symmetric, facial light touch sensation normal bilaterally VIII: hearing normal bilaterally IX,X: gag reflex present XI: trapezius strength/neck flexion strength normal bilaterally XII: tongue strength normal  Motor: LUE and LLE 5/5 strength RUE with giveaway strength RLE effort dependent weakness  with + Hoover sign Tone and bulk:normal tone throughout; no atrophy noted Sensory: Pinprick and light touch intact throughout, bilaterally Deep Tendon Reflexes: 2+ and symmetric throughout Plantars: Right: downgoing   Left: downgoing Cerebellar: normal finger-to-nose, unable to do HTS on LE Gait: deferred  Laboratory Studies:   Basic Metabolic Panel: No results for input(s): NA, K, CL, CO2, GLUCOSE, BUN, CREATININE, CALCIUM, MG, PHOS in the last 168 hours.  Liver Function Tests: No results for input(s): AST, ALT, ALKPHOS, BILITOT, PROT, ALBUMIN in the last 168 hours. No results for input(s): LIPASE, AMYLASE in the last 168 hours. No results for input(s): AMMONIA in the last 168 hours.  CBC: No results for input(s): WBC, NEUTROABS, HGB, HCT, MCV, PLT in the last 168 hours.  Cardiac Enzymes: No results for input(s): CKTOTAL, CKMB, CKMBINDEX, TROPONINI in the last 168 hours.  BNP: Invalid input(s): POCBNP  CBG: No results for input(s): GLUCAP in the last 168 hours.  Microbiology: Results for orders placed or performed during the hospital encounter of 04/01/14  Blood culture (routine x 2)     Status: None   Collection Time: 04/01/14  6:20 AM  Result Value Ref Range Status   Specimen Description BLOOD LEFT HAND  Final   Special Requests BOTTLES DRAWN AEROBIC AND ANAEROBIC [facility] EACH  Final   Culture  Setup Time   Final    04/01/2014 18:00 Performed at [facility]   Culture   Final    PROPIONIBACTERIUM SPECIES Note: Gram Stain Report Called to,Read Back By and Verified With: CARINA SULLIVIN BY PEAKY 04/07/14 240PM Performed at [facility]   Report Status 04/08/2014 FINAL  Final  Urine culture     Status: None   Collection Time: 04/01/14  7:27 AM  Result Value Ref Range Status   Specimen Description URINE, CLEAN CATCH  Final   Special Requests NONE  Final   Culture  Setup Time   Final    04/01/2014 18:34 Performed at [facility] Count    Final    3,000 COLONIES/ML Performed at [facility]   Culture   Final    INSIGNIFICANT GROWTH Performed at [facility]   Report Status 04/02/2014 FINAL  Final  Blood culture (routine x 2)     Status: None   Collection Time: 04/01/14  8:20 AM  Result Value Ref Range Status   Specimen Description BLOOD LEFT ARM  Final   Special Requests BOTTLES DRAWN AEROBIC AND ANAEROBIC [facility]  Final   Culture  Setup Time   Final    04/01/2014 18:00 Performed at [facility]   Culture   Final    NO GROWTH 5 DAYS Performed at [facility]   Report Status 04/07/2014 FINAL  Final    Coagulation Studies: No results for input(s): LABPROT, INR in the last 72 hours.  Urinalysis: No results for input(s): COLORURINE, LABSPEC, PHURINE, GLUCOSEU, HGBUR, BILIRUBINUR, KETONESUR, PROTEINUR, UROBILINOGEN, NITRITE, LEUKOCYTESUR  in the last 168 hours.  Invalid input(s): APPERANCEUR  Lipid Panel:     Component Value Date/Time   CHOL 180 03/11/2014 0655   TRIG 160* 03/11/2014 0655   HDL 27* 03/11/2014 0655   CHOLHDL 6.7 03/11/2014 0655   VLDL 32 03/11/2014 0655   LDLCALC 121* 03/11/2014 0655    HgbA1C: No results found for: HGBA1C  Urine Drug Screen:      Component Value Date/Time   LABOPIA NEG 08/22/2014 1327   LABOPIA NONE DETECTED 02/11/2014 1801   COCAINSCRNUR NEG 08/22/2014 1327   COCAINSCRNUR NONE DETECTED 02/11/2014 1801   LABBENZ NEG 08/22/2014 1327   LABBENZ NONE DETECTED 02/11/2014 1801   AMPHETMU NEG 08/22/2014 1327   AMPHETMU NONE DETECTED 02/11/2014 1801   THCU NEG 08/22/2014 1327   THCU NONE DETECTED 02/11/2014 1801   LABBARB NEG 08/22/2014 1327   LABBARB NONE DETECTED 02/11/2014 1801    Alcohol Level: No results for input(s): ETH in the last 168 hours.  Other results:  Imaging: Ct Abdomen Pelvis W Contrast  10/18/2014   CLINICAL DATA:  Epigastric pain. Nausea and vomiting for the past few weeks. Postprandial. Cholecystectomy as a child. History  of pancreatitis. Recent normal endoscopy. Pancreatic pseudocyst.  EXAM: CT ABDOMEN AND PELVIS WITH CONTRAST  TECHNIQUE: Multidetector CT imaging of the abdomen and pelvis was performed using the standard protocol following bolus administration of intravenous contrast.  CONTRAST:  80mL OMNIPAQUE IOHEXOL 300 MG/ML  SOLN  COMPARISON:  06/15/2014  FINDINGS: Lower chest: Clear lung bases. Normal heart size without pericardial or pleural effusion. Mildly prominent periesophageal nodes are unchanged and favored to be reactive.  Hepatobiliary: Moderate to marked enlargement of the caudate lobe is chronic. No specific evidence of cirrhosis. No focal liver lesion. Cholecystectomy, without biliary ductal dilatation.  Pancreas: Pancreatic atrophy. No duct dilatation. Vague hypo enhancing "Lesion" within the posterior pancreatic body/ tail junction measures 2.5 x 1.9 cm on image 27. At the site of a 3.0 x 2.5 cm lesion on 06/15/2014. No surrounding inflammation.  Spleen: Normal  Adrenals/Urinary Tract: Normal right adrenal gland. The left adrenal nodule is similar over multiple prior exams, 1.6 cm. Low-density on kidney delayed images, strongly favoring an adenoma.  Normal kidneys, without hydronephrosis.  Normal urinary bladder.  Stomach/Bowel: Normal stomach, without wall thickening. Colonic stool burden suggests constipation. Normal colon, appendix, Normal terminal ileum and appendix. Prior enterotomy. Mild prominence of small bowel loops in this area, including on image 40. Likely due to postoperative atony. No bowel obstruction.  Vascular/Lymphatic: Normal caliber of the aorta and branch vessels. Multiple small retroperitoneal nodes are similar.  Right external iliac adenopathy is similar at 1.5 cm.  Reproductive: Normal uterus and adnexa.  Other: No significant free fluid. Fat containing ventral abdominal wall hernias.  Musculoskeletal: No acute osseous abnormality.  IMPRESSION: 1. Further decrease in size of a pancreatic  body/tail junction cystic lesion. Although technically indeterminate, ongoing regression suggests a resolving pseudocyst. No acute inflammation seen. 2. Possible constipation. 3. 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 4. Ventral abdominal wall hernias containing fat, as before. 5. Left adrenal nodule, similar and strongly favored to represent an adenoma.   Electronically Signed   By: Jeronimo Greaves M.D.   On: 10/18/2014 16:52     Assessment/Plan:  39 y/o woman with history of anxiety, depression, self reported MS presenting with stuttering speech and right sided weakness. Of note she was evaluated by a MS specialist at [facility] in November and diagnosed with a functional  neurologic disorder. Based on history and exam have low suspicion for a true MS exacerbation. Due to patients inability to walk likely will need admission for physical therapy. Will hold on IV solumedrol at this time pending MRI results.   -check MRI brain with and without contrast -PT/OT/SLT evaluation -consider psychiatry evaluation  Elspeth Cho, DO [facility] [phone]  If 7pm- 7am, please page neurology on call as listed in AMION. 10/19/2014, 8:46 PM

## 2014-10-19 NOTE — ED Notes (Signed)
Pt states she is having an MS exacerbation, causing foot drop and stuttering-- pt speech is slow and stuttering -- difficult to understand. Was dx with MS January 2015.

## 2014-10-20 ENCOUNTER — Emergency Department (HOSPITAL_COMMUNITY): Payer: Medicaid Other

## 2014-10-20 DIAGNOSIS — M6289 Other specified disorders of muscle: Secondary | ICD-10-CM

## 2014-10-20 DIAGNOSIS — G988 Other disorders of nervous system: Secondary | ICD-10-CM

## 2014-10-20 DIAGNOSIS — F447 Conversion disorder with mixed symptom presentation: Secondary | ICD-10-CM | POA: Diagnosis not present

## 2014-10-20 DIAGNOSIS — G9349 Other encephalopathy: Secondary | ICD-10-CM | POA: Diagnosis not present

## 2014-10-20 MED ORDER — ALPRAZOLAM 0.5 MG PO TABS
0.5000 mg | ORAL_TABLET | Freq: Once | ORAL | Status: AC | PRN
  Administered 2014-10-20: 0.5 mg via ORAL
  Filled 2014-10-20: qty 1

## 2014-10-20 MED ORDER — GADOBENATE DIMEGLUMINE 529 MG/ML IV SOLN
20.0000 mL | Freq: Once | INTRAVENOUS | Status: AC | PRN
Start: 2014-10-20 — End: 2014-10-20
  Administered 2014-10-20: 20 mL via INTRAVENOUS

## 2014-10-20 NOTE — ED Notes (Signed)
Pt's mother [name]: [phone]. Please call if you need anything/update.

## 2014-10-20 NOTE — Discharge Instructions (Signed)
Follow-up with the neurologist provided.  The MRI shows that she had of those white matter changes, but no definitive diagnosis can be made solely on this that is why follow-up is important

## 2014-10-20 NOTE — ED Notes (Signed)
After talking with PA Lawyer, re plan of care: Pt to have social worker and case management consult for possible nursing home placement, instructed to ambulate pt. If pt unable to ambulate then the consult orders would be placed. Plan of care discussed with pt. Pt reports, "I'm ready to go home." "I can walk, I walked in here." "I didn't even want to come here, my mom said she thought I should come." Pt notified of MRI results and ambulated from bed to hallway with her cane. Pt instructed to follow up with her Neurologist.

## 2014-10-20 NOTE — Consult Note (Addendum)
[facility] Medical Consultation  Mary [name] UDJ:[id number] DOB: [birth year] DOA: 10/19/2014 PCP: [name], NP   Requesting physician: Marlon Pel PA-C Date of consultation: 10/20/14 Reason for consultation: MS exacerbation  Impression/Recommendations Principal Problem:   Functional neurological symptom disorder with mixed symptoms   1. Functional neurologic disorder - please see Dr. Minus Breeding note as well, patient has been evaluated now by multiple neurologists including Dr. Hosie Poisson this evening and a MS sub specialist at [facility], all of whom feel that the patient does not have MS, or any active demyelinating disease at this point causing her presentation to the ED this evening and instead has a functional disorder. 1. MRI with and without contrast has been completed, the patient has no active nor new white matter lesions in her brain since prior study, no evidence of active demyelination.  Patient does have stable lesions since June of last year, which may reflect sequela of some form of demyelination in the past, but this does not explain her acute symptoms today. 2. Neurology does not recommend any solumedrol or immunosuppressive treatment at this time. 3. I spoke with Dr. Hosie Poisson, and he does not feel that MRI of spine is needed at this time, especially as her symptoms include a speech change / stuttering speech which would not be effected by a spinal lesion anyhow. 4. Dr. Hosie Poisson does not recommend any specific additional medical / neurologic work up at this time. 5. Consider psychiatric evaluation.   Chief Complaint: MS exacerbation  HPI:  39 yo F with hx anxiety, depression, presenting for evaluation of R sided weakness and stuttering speech which she reports is a MS exacerbation.  She has been followed by Dr Everlena Cooper of [facility] neurology with a full MS workup. Initially tried on copaxone, could not tolerate. He ultimately referred her to [facility] for a 2nd opinion over  concern of a functional diagnosis. She was evaluated in 06/2014 and given a diagnosis of a functional neurologic disorder.   Review of Systems:  12 systems reviewed and otherwise negative.  Past Medical History  Diagnosis Date  . Hernia 03-24-13    ventral hernia remains   . GERD (gastroesophageal reflux disease)   . Pancreatitis 03-24-13    2002, 2'2013, 03-14-13  . Pancreatic cyst 2002 onset  . Anxiety   . Depression     "recent breakup with partner of 15 yrs"  . Adrenal insufficiency 04/23/2013    ??  . MS (multiple sclerosis)    Past Surgical History  Procedure Laterality Date  . Abdominal surgery  ~ 2007    some sort of pancreatic cyst drainage.   . Cholecystectomy      ~ age 26-laparoscopic  . Adenoidectomy    . 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 Eus N/A 04/14/2013    Procedure: UPPER ENDOSCOPIC ULTRASOUND (EUS) LINEAR;  Surgeon: Rachael Fee, MD;  Location: [facility];  Service: Endoscopy;  Laterality: N/A;   Social History:  reports that she quit smoking about 16 months ago. Her smoking use included Cigarettes. She has a 10 pack-year smoking history. She has never used smokeless tobacco. She reports that she does not drink alcohol or use illicit drugs.  Allergies  Allergen Reactions  . Amoxicillin Anaphylaxis  . Penicillins Anaphylaxis  . Latex Itching  . Morphine And Related Nausea And Vomiting  . Buprenorphine Hcl Nausea And Vomiting   Family History  Problem Relation Age  of Onset  . Lupus Neg Hx   . Sarcoidosis Neg Hx   . Pancreatitis Neg Hx   . Ataxia Neg Hx   . Chorea Neg Hx   . Dementia Neg Hx   . Mental retardation Neg Hx   . Migraines Neg Hx   . Multiple sclerosis Neg Hx   . Neurofibromatosis Neg Hx   . Neuropathy Neg Hx   . Parkinsonism Neg Hx   . Seizures Neg Hx   . Stroke Neg Hx   . Colitis Maternal Aunt   . Diabetes Mother   . Diabetes Father   . Diabetes      many family members     Prior to Admission medications   Medication Sig Start Date End Date Taking? Authorizing Provider  cyclobenzaprine (FLEXERIL) 5 MG tablet Take 1 tablet (5 mg total) by mouth 3 (three) times daily as needed for muscle spasms. 09/25/14  Yes Ranelle Oyster, MD  lipase/protease/amylase (CREON-12/PANCREASE) 12000 UNITS CPEP capsule Take 2 capsules by mouth 3 (three) times daily with meals. 07/31/13  Yes Catarina Hartshorn, MD  ondansetron (ZOFRAN ODT) 4 MG disintegrating tablet  ODT q4 hours prn nausea/vomit 09/29/14  Yes Jessica D. Zehr, PA-C  Oxycodone HCl 10 MG TABS Take 1 tablet (10 mg total) by mouth every 6 (six) hours as needed (severe pain). 09/25/14  Yes Ranelle Oyster, MD  pantoprazole (PROTONIX) 40 MG tablet Take 1 tablet (40 mg total) by mouth daily. 09/29/14  Yes Jessica D. Zehr, PA-C  promethazine (PHENERGAN) 25 MG tablet Take 1 tablet (25 mg total) by mouth every 6 (six) hours as needed for nausea or vomiting. 09/29/14  Yes Shanda Bumps D. Zehr, PA-C  topiramate (TOPAMAX) 100 MG tablet Take 1 tablet (100 mg total) by mouth daily. 09/25/14  Yes Ranelle Oyster, MD  traMADol (ULTRAM) 50 MG tablet Take 1 tablet (50 mg total) by mouth every 6 (six) hours as needed. Patient taking differently: Take 50 mg by mouth every 6 (six) hours as needed for moderate pain.  09/25/14  Yes Ranelle Oyster, MD  Triamcinolone Acetonide (TRIAMCINOLONE 0.1 % CREAM : EUCERIN) CREA Apply 1 application topically 3 (three) times daily. Patient not taking: Reported on 10/04/2014 02/23/14   Jacquelynn Cree, PA-C   Physical Exam: Blood pressure 91/57, pulse 64, temperature 98.1 F (36.7 C), resp. rate 22, weight 97.268 kg (214 lb 7 oz), last menstrual period 10/04/2014, SpO2 96 %. Filed Vitals:   10/20/14 0442  BP:   Pulse: 64  Temp:   Resp:     General:  NAD, resting comfortably in bed Eyes: PEERLA EOMI ENT: mucous membranes moist Neck: supple w/o JVD Cardiovascular: RRR w/o MRG Respiratory: CTA B Abdomen: soft, nt, nd,  bs+ Skin: no rash nor lesion Musculoskeletal: MAE, full ROM all 4 extremities Psychiatric: normal tone and affect Neurologic: AAOx3, grossly non-focal  Labs on Admission:  Basic Metabolic Panel:  Recent Labs Lab 10/19/14 2125  NA 138  K 4.0  CL 108  CO2 24  GLUCOSE 93  BUN 15  CREATININE 0.76  CALCIUM 9.6   Liver Function Tests: No results for input(s): AST, ALT, ALKPHOS, BILITOT, PROT, ALBUMIN in the last 168 hours. No results for input(s): LIPASE, AMYLASE in the last 168 hours. No results for input(s): AMMONIA in the last 168 hours. CBC:  Recent Labs Lab 10/19/14 2125  WBC 8.5  NEUTROABS 5.5  HGB 13.5  HCT 39.9  MCV 83.8  PLT 276   Cardiac  Enzymes: No results for input(s): CKTOTAL, CKMB, CKMBINDEX, TROPONINI in the last 168 hours. BNP: Invalid input(s): POCBNP CBG: No results for input(s): GLUCAP in the last 168 hours.  Radiological Exams on Admission: Mr Laqueta Jean Wo Contrast  10/20/2014   EXAM: MRI HEAD WITHOUT AND WITH CONTRAST  TECHNIQUE: Multiplanar, multiecho pulse sequences of the brain and surrounding structures were obtained without and with intravenous contrast.  CONTRAST:  20mL MULTIHANCE GADOBENATE DIMEGLUMINE 529 MG/ML IV SOLN  COMPARISON:  Prior MRI from 02/12/2014.  FINDINGS: Cerebral volume is stable from previous study.  Widespread abnormal cerebral white matter T2 and FLAIR hyperintensity again seen. Involvement of the pons and right cerebellar hemisphere again noted. Comparing sagittal FLAIR sequence, overall, the extent of disease is not significantly changed relative to 6/20 8/15. No lesions demonstrate restricted diffusion or abnormal enhancement to suggest active demyelination.  No restricted diffusion to suggest acute infarct. Gray-white matter differentiation maintained. Normal intravascular flow voids present. No acute or chronic intracranial hemorrhage.  No midline shift or mass effect. No mass lesion. Ventricles are normal in size without  evidence of hydrocephalus. No extra-axial fluid collection.  Craniocervical junction normal. Pituitary gland within normal limits.  No acute abnormality seen about the orbits.  Paranasal sinuses and mastoid air cells are clear.  Bone marrow signal intensity is normal. No scalp soft tissue abnormality.  IMPRESSION: 1. Stable appearance of abnormal cerebral, pontine, and right cerebellar white matter as compared to prior study from 02/12/2014. Findings are nonspecific, but may reflect sequelae of underlying multiple sclerosis. No new lesions or evidence of active demyelination. 2. No other acute intracranial abnormality.   Electronically Signed   By: Rise Mu M.D.   On: 10/20/2014 04:48   Ct Abdomen Pelvis W Contrast  10/18/2014   CLINICAL DATA:  Epigastric pain. Nausea and vomiting for the past few weeks. Postprandial. Cholecystectomy as a child. History of pancreatitis. Recent normal endoscopy. Pancreatic pseudocyst.  EXAM: CT ABDOMEN AND PELVIS WITH CONTRAST  TECHNIQUE: Multidetector CT imaging of the abdomen and pelvis was performed using the standard protocol following bolus administration of intravenous contrast.  CONTRAST:  80mL OMNIPAQUE IOHEXOL 300 MG/ML  SOLN  COMPARISON:  06/15/2014  FINDINGS: Lower chest: Clear lung bases. Normal heart size without pericardial or pleural effusion. Mildly prominent periesophageal nodes are unchanged and favored to be reactive.  Hepatobiliary: Moderate to marked enlargement of the caudate lobe is chronic. No specific evidence of cirrhosis. No focal liver lesion. Cholecystectomy, without biliary ductal dilatation.  Pancreas: Pancreatic atrophy. No duct dilatation. Vague hypo enhancing "Lesion" within the posterior pancreatic body/ tail junction measures 2.5 x 1.9 cm on image 27. At the site of a 3.0 x 2.5 cm lesion on 06/15/2014. No surrounding inflammation.  Spleen: Normal  Adrenals/Urinary Tract: Normal right adrenal gland. The left adrenal nodule is similar  over multiple prior exams, 1.6 cm. Low-density on kidney delayed images, strongly favoring an adenoma.  Normal kidneys, without hydronephrosis.  Normal urinary bladder.  Stomach/Bowel: Normal stomach, without wall thickening. Colonic stool burden suggests constipation. Normal colon, appendix, Normal terminal ileum and appendix. Prior enterotomy. Mild prominence of small bowel loops in this area, including on image 40. Likely due to postoperative atony. No bowel obstruction.  Vascular/Lymphatic: Normal caliber of the aorta and branch vessels. Multiple small retroperitoneal nodes are similar.  Right external iliac adenopathy is similar at 1.5 cm.  Reproductive: Normal uterus and adnexa.  Other: No significant free fluid. Fat containing ventral abdominal wall hernias.  Musculoskeletal: No acute  osseous abnormality.  IMPRESSION: 1. Further decrease in size of a pancreatic body/tail junction cystic lesion. Although technically indeterminate, ongoing regression suggests a resolving pseudocyst. No acute inflammation seen. 2. Possible constipation. 3. 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 4. Ventral abdominal wall hernias containing fat, as before. 5. Left adrenal nodule, similar and strongly favored to represent an adenoma.   Electronically Signed   By: Jeronimo Greaves M.D.   On: 10/18/2014 16:52    EKG: Independently reviewed.  Time spent: 60 min  Hyman Crossan M. [facility] Pager [phone]  If 7PM-7AM, please contact night-coverage [email or web address] Password [email or web address] 10/20/2014, 5:29 AM

## 2014-10-20 NOTE — ED Provider Notes (Signed)
The patient reports that she has been able to ambulate and never told anyone that she could not.  The patient states that she was here because she felt much her condition may be worsening.  Patient will be referred to neurology for further evaluation and care  Carlyle Dolly, PA-C 10/20/14 1610  Arby Barrette, MD 10/20/14 1037

## 2014-11-01 ENCOUNTER — Encounter: Payer: Medicaid Other | Attending: Physical Medicine & Rehabilitation | Admitting: Physical Medicine & Rehabilitation

## 2014-11-01 ENCOUNTER — Encounter: Payer: Self-pay | Admitting: Physical Medicine & Rehabilitation

## 2014-11-01 VITALS — BP 110/68 | HR 88 | Resp 14

## 2014-11-01 DIAGNOSIS — G35A Relapsing-remitting multiple sclerosis: Secondary | ICD-10-CM

## 2014-11-01 DIAGNOSIS — M545 Low back pain, unspecified: Secondary | ICD-10-CM | POA: Insufficient documentation

## 2014-11-01 DIAGNOSIS — K861 Other chronic pancreatitis: Secondary | ICD-10-CM | POA: Diagnosis not present

## 2014-11-01 DIAGNOSIS — G35 Multiple sclerosis: Secondary | ICD-10-CM | POA: Insufficient documentation

## 2014-11-01 DIAGNOSIS — M5416 Radiculopathy, lumbar region: Secondary | ICD-10-CM

## 2014-11-01 DIAGNOSIS — M47816 Spondylosis without myelopathy or radiculopathy, lumbar region: Secondary | ICD-10-CM | POA: Insufficient documentation

## 2014-11-01 DIAGNOSIS — G894 Chronic pain syndrome: Secondary | ICD-10-CM | POA: Insufficient documentation

## 2014-11-01 DIAGNOSIS — R1013 Epigastric pain: Secondary | ICD-10-CM

## 2014-11-01 DIAGNOSIS — G8191 Hemiplegia, unspecified affecting right dominant side: Secondary | ICD-10-CM

## 2014-11-01 DIAGNOSIS — M792 Neuralgia and neuritis, unspecified: Secondary | ICD-10-CM

## 2014-11-01 DIAGNOSIS — M5441 Lumbago with sciatica, right side: Secondary | ICD-10-CM

## 2014-11-01 DIAGNOSIS — G8190 Hemiplegia, unspecified affecting unspecified side: Secondary | ICD-10-CM

## 2014-11-01 MED ORDER — TRAMADOL HCL 50 MG PO TABS: 50.0000 mg | ORAL_TABLET | Freq: Four times a day (QID) | ORAL | Status: DC | PRN

## 2014-11-01 MED ORDER — DIAZEPAM 5 MG PO TABS: 5.0000 mg | ORAL_TABLET | ORAL | Status: DC

## 2014-11-01 MED ORDER — OXYCODONE HCL 10 MG PO TABS: 10.0000 mg | ORAL_TABLET | Freq: Four times a day (QID) | ORAL | Status: DC | PRN

## 2014-11-01 NOTE — Progress Notes (Signed)
Subjective:    Patient ID: Mary [name], female    DOB: [birth year], 39 y.o.   MRN: [id number]  HPI   [name] is here in follow up of her MS and associated pain. She developed some paresthesias and increased weakness last week. She went to the ED who performed an MRI which showed stable white matter changes. Her PCP ultimately put her on a steroid taper after nothing was done at [facility] The steroids ultimately have helped and she's had improvement in her symptoms. [facility] neuro is not convinced that she is actually suffering from MS and suspects a functional weakness/non-organic problem  Lestie remains on the oxycodone and tramadol for pain control. She feels the combination is working well for her along with the tramadol.  Pecola complains of continued back pain with radiation into the right leg. It is worse with gait and general activities.      Pain Inventory Average Pain 6 Pain Right Now 6 My pain is constant, sharp, dull, tingling and aching  In the last 24 hours, has pain interfered with the following? General activity 8 Relation with others 8 Enjoyment of life 8 What TIME of day is your pain at its worst? morning Sleep (in general) Poor  Pain is worse with: walking, bending, sitting, standing and some activites Pain improves with: rest and medication Relief from Meds: 9  Mobility walk without assistance how many minutes can you walk? 10-15 ability to climb steps?  yes do you drive?  yes  Function disabled: date disabled .  Neuro/Psych weakness numbness tingling trouble walking depression anxiety  Prior Studies Any changes since last visit?  no  Physicians involved in your care Any changes since last visit?  no   Family History  Problem Relation Age of Onset  . Lupus Neg Hx   . Sarcoidosis Neg Hx   . Pancreatitis Neg Hx   . Ataxia Neg Hx   . Chorea Neg Hx   . Dementia Neg Hx   . Mental retardation Neg Hx   . Migraines Neg Hx   . Multiple sclerosis Neg  Hx   . Neurofibromatosis Neg Hx   . Neuropathy Neg Hx   . Parkinsonism Neg Hx   . Seizures Neg Hx   . Stroke Neg Hx   . Colitis Maternal Aunt   . Diabetes Mother   . Diabetes Father   . Diabetes      many family members   History   Social History  . Marital Status: Single    Spouse Name: N/A  . Number of Children: N/A  . Years of Education: N/A   Social History Main Topics  . Smoking status: Former Smoker -- 0.50 packs/day for 20 years    Types: Cigarettes    Quit date: 05/26/2013  . Smokeless tobacco: Never Used  . Alcohol Use: No  . Drug Use: No  . Sexual Activity: No   Other Topics Concern  . None   Social History Narrative   Lives permanently in [place] with mom and stepfather.  Has not started working yet and was unemployed in [place].            Past Surgical History  Procedure Laterality Date  . Abdominal surgery  ~ 2007    some sort of pancreatic cyst drainage.   . Cholecystectomy      ~ age 67-laparoscopic  . Adenoidectomy    . Roux-en-y procedure      stent to pancreatic cyst that became  infected within 36 hours  . Eus N/A 04/14/2013    Procedure: UPPER ENDOSCOPIC ULTRASOUND (EUS) LINEAR;  Surgeon: Rachael Fee, MD;  Location: [facility];  Service: Endoscopy;  Laterality: N/A;   Past Medical History  Diagnosis Date  . Hernia 03-24-13    ventral hernia remains   . GERD (gastroesophageal reflux disease)   . Pancreatitis 03-24-13    2002, 2'2013, 03-14-13  . Pancreatic cyst 2002 onset  . Anxiety   . Depression     "recent breakup with partner of 15 yrs"  . Adrenal insufficiency 04/23/2013    ??  . MS (multiple sclerosis)    BP 110/68 mmHg  Pulse 88  Resp 14  SpO2 96%  LMP 10/04/2014  Opioid Risk Score:   Fall Risk Score: Low Fall Risk (0-5 points)  Review of Systems  HENT: Negative.   Eyes: Negative.   Respiratory: Negative.   Cardiovascular: Negative.   Gastrointestinal: Negative.   Endocrine: Negative.   Genitourinary: Negative.     Musculoskeletal: Positive for myalgias, back pain and arthralgias.  Skin: Negative.   Allergic/Immunologic: Negative.   Neurological: Positive for weakness and numbness.       Tingling, trouble walking  Hematological: Negative.   Psychiatric/Behavioral: Positive for dysphoric mood. The patient is nervous/anxious.        Objective:   Physical Exam  General: Alert and oriented x 3, No apparent distress. obese  HEENT: Head is normocephalic, atraumatic, PERRLA, EOMI, sclera anicteric, oral mucosa pink and moist, dentition intact, ext ear canals clear,  Neck: Supple without JVD or lymphadenopathy  Heart: Reg rate and rhythm. No murmurs rubs or gallops  Chest: CTA bilaterally without wheezes, rales, or rhonchi; no distress  Abdomen: Soft, non-tender, non-distended, bowel sounds positive.  Extremities: No clubbing, cyanosis, or edema. Pulses are 2+  Skin: Clean and intact without signs of breakdown  Neuro: Pt is cognitively appropriate with normal insight, memory, and awareness. Cranial nerves 2-12 are intact. Sensory exam is slightly decreased on the right. Reflexes are 3+ right and 2+ left. Strength 4/5 rue prox to distal. 3+ to 4/5 RLE in all muscle groups in inconsistent pattern. Fine motor coordination is decreased RLE and RUE. She struggles with clearance of the right foot during swing phase of gait. . No tremors. .  Musculoskeletal: decreased grip and ROM in the low back  Low back tender with flexion, extension and rotation. SLR + on right. Hamstrings tight. mid back paraspinals taut.  Psych: Pt's affect is appropriate. Pt is cooperative   Assessment & Plan:   1. 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   2. 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 Low back pain with ? Radiculopathy RLE  4. Chronic pancreatitis    Plan:  1. Increase topamax to  daily for headaches and neuropathic pain.  2. Tramadol #60  were written today to help with neuro/abd pain  3. Oxycodone was refilled.  4. Ordered an MRI of her lumbar spine to assess nerve roots on right (L4-5 in particular) given increased low back pain and ongoing weakness in the right leg.  5. Continue with AFO for gait and back stabilization.  6. Pancreatitis management per PCP and gastroenterologist.  7. Neuro work up at [facility]?  8. Follow up with [name] in about a month with [name] or NP. of face to face patient care time were spent during this visit. All questions were encouraged and answered.

## 2014-11-01 NOTE — Patient Instructions (Signed)
PLEASE CALL ME WITH ANY PROBLEMS OR QUESTIONS (#[phone]).      

## 2014-11-07 ENCOUNTER — Ambulatory Visit (HOSPITAL_COMMUNITY)
Admission: RE | Admit: 2014-11-07 | Discharge: 2014-11-07 | Disposition: A | Discharge status: Home / Self Care | Payer: Medicaid Other | Source: Ambulatory Visit | Attending: Physical Medicine & Rehabilitation | Admitting: Physical Medicine & Rehabilitation

## 2014-11-07 DIAGNOSIS — M5416 Radiculopathy, lumbar region: Secondary | ICD-10-CM

## 2014-11-07 DIAGNOSIS — M5441 Lumbago with sciatica, right side: Secondary | ICD-10-CM | POA: Diagnosis not present

## 2014-11-07 DIAGNOSIS — G8190 Hemiplegia, unspecified affecting unspecified side: Secondary | ICD-10-CM | POA: Insufficient documentation

## 2014-11-07 DIAGNOSIS — G8191 Hemiplegia, unspecified affecting right dominant side: Secondary | ICD-10-CM

## 2014-11-07 DIAGNOSIS — M792 Neuralgia and neuritis, unspecified: Secondary | ICD-10-CM

## 2014-11-13 ENCOUNTER — Telehealth: Payer: Self-pay | Admitting: Physical Medicine & Rehabilitation

## 2014-11-13 NOTE — Telephone Encounter (Signed)
Please let Envi know that there is mild disk degeneration at L5-S1 and mild to moderate facet arthropathy in lower lumbar segments. No emergent findings. i suspect facet disease is largely contributing to back pain in the setting of her muscular weakness/MS

## 2014-11-14 NOTE — Telephone Encounter (Signed)
Notified Tyishia 

## 2014-12-01 ENCOUNTER — Encounter: Payer: Self-pay | Admitting: Registered Nurse

## 2014-12-01 ENCOUNTER — Encounter: Payer: Medicaid Other | Attending: Physical Medicine & Rehabilitation | Admitting: Registered Nurse

## 2014-12-01 VITALS — BP 104/51 | HR 62 | Resp 14

## 2014-12-01 DIAGNOSIS — Z79899 Other long term (current) drug therapy: Secondary | ICD-10-CM | POA: Diagnosis not present

## 2014-12-01 DIAGNOSIS — K861 Other chronic pancreatitis: Secondary | ICD-10-CM | POA: Insufficient documentation

## 2014-12-01 DIAGNOSIS — M545 Low back pain: Secondary | ICD-10-CM | POA: Diagnosis not present

## 2014-12-01 DIAGNOSIS — M5416 Radiculopathy, lumbar region: Secondary | ICD-10-CM | POA: Diagnosis not present

## 2014-12-01 DIAGNOSIS — G894 Chronic pain syndrome: Secondary | ICD-10-CM | POA: Diagnosis present

## 2014-12-01 DIAGNOSIS — Z5181 Encounter for therapeutic drug level monitoring: Secondary | ICD-10-CM

## 2014-12-01 DIAGNOSIS — G35 Multiple sclerosis: Secondary | ICD-10-CM | POA: Insufficient documentation

## 2014-12-01 DIAGNOSIS — G8190 Hemiplegia, unspecified affecting unspecified side: Secondary | ICD-10-CM | POA: Diagnosis not present

## 2014-12-01 DIAGNOSIS — G8191 Hemiplegia, unspecified affecting right dominant side: Secondary | ICD-10-CM

## 2014-12-01 MED ORDER — OXYCODONE HCL 10 MG PO TABS: 10.0000 mg | ORAL_TABLET | Freq: Four times a day (QID) | ORAL | Status: DC | PRN

## 2014-12-01 MED ORDER — TRAMADOL HCL 50 MG PO TABS: 50.0000 mg | ORAL_TABLET | Freq: Four times a day (QID) | ORAL | Status: DC | PRN

## 2014-12-01 NOTE — Progress Notes (Signed)
Subjective:    Patient ID: Mary [name], female    DOB: 15-Jun-1976, 39 y.o.   MRN: [id number]  HPI: [name] was a 39 year old female who returns for follow up for chronic pain and medication refill. She says her pain is located in her mid-lower back and right leg. She rates her pain 9. Her current exercise regime is going to gym every other day for 45 minutes.  She forgot her pill bottle, educated on the narcotic policy she verbalizes understanding. Instructed to bring her oxycodone bottle to every appointment. If she doesn't have her pill bottle prescription will not be given she verbalizes understanding. Reviewed the [facility] Controlled Substance Reporting System she picked up her medication up on 11/01/14.   Pain Inventory Average Pain 5 Pain Right Now 9 My pain is sharp, stabbing and aching  In the last 24 hours, has pain interfered with the following? General activity 4 Relation with others 0 Enjoyment of life 0 What TIME of day is your pain at its worst? morning, evening Sleep (in general) Fair  Pain is worse with: bending, standing and some activites Pain improves with: medication Relief from Meds: 7  Mobility walk with assistance do you drive?  yes  Function not employed: date last employed . disabled: date disabled .  Neuro/Psych weakness numbness tingling spasms dizziness confusion  Prior Studies Any changes since last visit?  no  Physicians involved in your care Any changes since last visit?  no   Family History  Problem Relation Age of Onset  . Lupus Neg Hx   . Sarcoidosis Neg Hx   . Pancreatitis Neg Hx   . Ataxia Neg Hx   . Chorea Neg Hx   . Dementia Neg Hx   . Mental retardation Neg Hx   . Migraines Neg Hx   . Multiple sclerosis Neg Hx   . Neurofibromatosis Neg Hx   . Neuropathy Neg Hx   . Parkinsonism Neg Hx   . Seizures Neg Hx   . Stroke Neg Hx   . Colitis Maternal Aunt   . Diabetes Mother   . Diabetes Father   .  Diabetes      many family members   History   Social History  . Marital Status: Single    Spouse Name: N/A  . Number of Children: N/A  . Years of Education: N/A   Social History Main Topics  . Smoking status: Former Smoker -- 0.50 packs/day for 20 years    Types: Cigarettes    Quit date: 05/26/2013  . Smokeless tobacco: Never Used  . Alcohol Use: No  . Drug Use: No  . Sexual Activity: No   Other Topics Concern  . None   Social History Narrative   Lives permanently in [place] with mom and stepfather.  Has not started working yet and was unemployed in [place].            Past Surgical History  Procedure Laterality Date  . Abdominal surgery  ~ 2007    some sort of pancreatic cyst drainage.   . Cholecystectomy      ~ age 18-laparoscopic  . Adenoidectomy    . 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 Eus N/A 04/14/2013    Procedure: UPPER ENDOSCOPIC ULTRASOUND (EUS) LINEAR;  Surgeon: Rachael Fee, MD;  Location: [facility];  Service: Endoscopy;  Laterality: N/A;   Past  Medical History  Diagnosis Date  . Hernia 03-24-13    ventral hernia remains   . GERD (gastroesophageal reflux disease)   . Pancreatitis 03-24-13    2002, 2'2013, 03-14-13  . Pancreatic cyst 2002 onset  . Anxiety   . Depression     "recent breakup with partner of 15 yrs"  . Adrenal insufficiency 04/23/2013    ??  . MS (multiple sclerosis)    BP 104/51 mmHg  Pulse 62  Resp 14  SpO2 100%  Opioid Risk Score:   Fall Risk Score: Low Fall Risk (0-5 points)`1  Depression screen PHQ 2/9  Depression screen PHQ 2/9 11/01/2014  Decreased Interest 2  Down, Depressed, Hopeless 2  PHQ - 2 Score 4  Altered sleeping 1  Tired, decreased energy 1  Change in appetite 3  Feeling bad or failure about yourself  0  Trouble concentrating 1  Moving slowly or fidgety/restless 0  PHQ-9 Score 10     Review of Systems  Constitutional:       Weight loss    Gastrointestinal: Positive for nausea, vomiting and abdominal pain.  Neurological: Positive for dizziness, weakness and numbness.       Tingling spasms  Psychiatric/Behavioral: Positive for confusion.  All other systems reviewed and are negative.      Objective:   Physical Exam  Constitutional: She is oriented to person, place, and time. She appears well-developed and well-nourished.  HENT:  Head: Normocephalic and atraumatic.  Neck: Normal range of motion. Neck supple.  Cardiovascular: Normal rate and regular rhythm.   Pulmonary/Chest: Effort normal and breath sounds normal.  Musculoskeletal:  Normal Muscle Bulk and Muscle Testing Reveals: Upper Extremities: Full ROM and Muscle Strength on the Right 3/5 and Left 5/5 Thoracic Paraspinal Tenderness: T-7- T-9 Lumbar Paraspinal Tenderness: L-3- 5  ( Mainly Right Side) Lower Extremities: Full ROM and Muscle Strength 5/5 Right Lower Extremity Flexion Produces pain into Right Leg Arises from chair with ease Antalgic gait   Neurological: She is alert and oriented to person, place, and time.  Skin: Skin is warm and dry.  Psychiatric: She has a normal mood and affect.  Nursing note and vitals reviewed.         Assessment & Plan:  1. Likely relapsing remitting MS: [facility] appointment 12/13/14 2. 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 3. Low back pain with  Radiculopathy RLE/ S/P MRI continue topamax.  4. Chronic pancreatitis: PCP and Gastroenterologist Following   of face to face patient care time was spent during this visit. All questions were encouraged and answered.

## 2014-12-02 ENCOUNTER — Encounter (HOSPITAL_COMMUNITY): Payer: Self-pay | Admitting: Emergency Medicine

## 2014-12-02 ENCOUNTER — Emergency Department (HOSPITAL_COMMUNITY)
Admission: EM | Admit: 2014-12-02 | Discharge: 2014-12-02 | Disposition: A | Discharge status: Home / Self Care | Payer: Medicaid Other | Attending: Emergency Medicine | Admitting: Emergency Medicine

## 2014-12-02 DIAGNOSIS — F419 Anxiety disorder, unspecified: Secondary | ICD-10-CM | POA: Diagnosis not present

## 2014-12-02 DIAGNOSIS — Z9049 Acquired absence of other specified parts of digestive tract: Secondary | ICD-10-CM | POA: Diagnosis not present

## 2014-12-02 DIAGNOSIS — Z8639 Personal history of other endocrine, nutritional and metabolic disease: Secondary | ICD-10-CM | POA: Diagnosis not present

## 2014-12-02 DIAGNOSIS — Z8669 Personal history of other diseases of the nervous system and sense organs: Secondary | ICD-10-CM | POA: Insufficient documentation

## 2014-12-02 DIAGNOSIS — R1013 Epigastric pain: Secondary | ICD-10-CM | POA: Diagnosis not present

## 2014-12-02 DIAGNOSIS — R110 Nausea: Secondary | ICD-10-CM | POA: Diagnosis not present

## 2014-12-02 DIAGNOSIS — Z880 Allergy status to penicillin: Secondary | ICD-10-CM | POA: Diagnosis not present

## 2014-12-02 DIAGNOSIS — Z3202 Encounter for pregnancy test, result negative: Secondary | ICD-10-CM | POA: Diagnosis not present

## 2014-12-02 DIAGNOSIS — Z91040 Latex allergy status: Secondary | ICD-10-CM | POA: Insufficient documentation

## 2014-12-02 DIAGNOSIS — K219 Gastro-esophageal reflux disease without esophagitis: Secondary | ICD-10-CM | POA: Insufficient documentation

## 2014-12-02 DIAGNOSIS — F329 Major depressive disorder, single episode, unspecified: Secondary | ICD-10-CM | POA: Insufficient documentation

## 2014-12-02 DIAGNOSIS — Z87891 Personal history of nicotine dependence: Secondary | ICD-10-CM | POA: Diagnosis not present

## 2014-12-02 DIAGNOSIS — R1010 Upper abdominal pain, unspecified: Secondary | ICD-10-CM | POA: Insufficient documentation

## 2014-12-02 DIAGNOSIS — Z79899 Other long term (current) drug therapy: Secondary | ICD-10-CM | POA: Insufficient documentation

## 2014-12-02 LAB — CBC WITH DIFFERENTIAL/PLATELET
Basophils Absolute: 0 10*3/uL (ref 0.0–0.1)
Basophils Relative: 0 % (ref 0–1)
EOS ABS: 0.1 10*3/uL (ref 0.0–0.7)
Eosinophils Relative: 2 % (ref 0–5)
HCT: 35.8 % — ABNORMAL LOW (ref 36.0–46.0)
HEMOGLOBIN: 12 g/dL (ref 12.0–15.0)
LYMPHS ABS: 1.5 10*3/uL (ref 0.7–4.0)
Lymphocytes Relative: 19 % (ref 12–46)
MCH: 29 pg (ref 26.0–34.0)
MCHC: 33.5 g/dL (ref 30.0–36.0)
MCV: 86.5 fL (ref 78.0–100.0)
Monocytes Absolute: 0.6 10*3/uL (ref 0.1–1.0)
Monocytes Relative: 8 % (ref 3–12)
Neutro Abs: 5.4 10*3/uL (ref 1.7–7.7)
Neutrophils Relative %: 71 % (ref 43–77)
PLATELETS: 325 10*3/uL (ref 150–400)
RBC: 4.14 MIL/uL (ref 3.87–5.11)
RDW: 14.6 % (ref 11.5–15.5)
WBC: 7.6 10*3/uL (ref 4.0–10.5)

## 2014-12-02 LAB — COMPREHENSIVE METABOLIC PANEL
ALBUMIN: 4.1 g/dL (ref 3.5–5.2)
ALK PHOS: 79 U/L (ref 39–117)
ALT: 21 U/L (ref 0–35)
AST: 18 U/L (ref 0–37)
Anion gap: 5 (ref 5–15)
BUN: 22 mg/dL (ref 6–23)
CO2: 20 mmol/L (ref 19–32)
Calcium: 8.8 mg/dL (ref 8.4–10.5)
Chloride: 114 mmol/L — ABNORMAL HIGH (ref 96–112)
Creatinine, Ser: 0.58 mg/dL (ref 0.50–1.10)
Glucose, Bld: 93 mg/dL (ref 70–99)
POTASSIUM: 4.2 mmol/L (ref 3.5–5.1)
Sodium: 139 mmol/L (ref 135–145)
Total Bilirubin: 0.5 mg/dL (ref 0.3–1.2)
Total Protein: 8.1 g/dL (ref 6.0–8.3)

## 2014-12-02 LAB — PREGNANCY, URINE: PREG TEST UR: NEGATIVE

## 2014-12-02 LAB — URINALYSIS, ROUTINE W REFLEX MICROSCOPIC
BILIRUBIN URINE: NEGATIVE
GLUCOSE, UA: NEGATIVE mg/dL
Ketones, ur: NEGATIVE mg/dL
Nitrite: NEGATIVE
Protein, ur: NEGATIVE mg/dL
SPECIFIC GRAVITY, URINE: 1.024 (ref 1.005–1.030)
UROBILINOGEN UA: 0.2 mg/dL (ref 0.0–1.0)
pH: 5.5 (ref 5.0–8.0)

## 2014-12-02 LAB — URINE MICROSCOPIC-ADD ON

## 2014-12-02 LAB — LIPASE, BLOOD: Lipase: 16 U/L (ref 11–59)

## 2014-12-02 MED ORDER — HYDROMORPHONE HCL 1 MG/ML IJ SOLN
1.0000 mg | Freq: Once | INTRAMUSCULAR | Status: AC
  Administered 2014-12-02: 1 mg via INTRAVENOUS
  Filled 2014-12-02: qty 1

## 2014-12-02 MED ORDER — ONDANSETRON HCL 4 MG/2ML IJ SOLN
4.0000 mg | Freq: Once | INTRAMUSCULAR | Status: AC
  Administered 2014-12-02: 4 mg via INTRAVENOUS
  Filled 2014-12-02: qty 2

## 2014-12-02 NOTE — ED Notes (Signed)
Pt from home c/o generalized abdominal pain and nausea that began at about 0640. She reports a HX of pancreatitis that has been ongoing "for a while" she reports no known cause for pancreatitis.

## 2014-12-02 NOTE — Discharge Instructions (Signed)

## 2014-12-02 NOTE — ED Provider Notes (Signed)
CSN: [id number]     Arrival date & time 12/02/14  8295 History   First MD Initiated Contact with Patient 12/02/14 [phone]     Chief Complaint  Patient presents with  . Abdominal Pain  . Nausea     (Consider location/radiation/quality/duration/timing/severity/associated sxs/prior Treatment) HPI Comments: Pt comes in with nausea and upper abdominal pain that started this morning. Denies vomiting, diarrhea or fever. States that it feels similar to pancreatitis which she has had in the past. No dysuria or vaginal discharge. Denies etoh abuse. She states that they have not been able to decipher the reason for her pancreatitis. Pt has had a cholecystectomy  The history is provided by the patient. No language interpreter was used.    Past Medical History  Diagnosis Date  . Hernia 03-24-13    ventral hernia remains   . GERD (gastroesophageal reflux disease)   . Pancreatitis 03-24-13    2002, 2'2013, 03-14-13  . Pancreatic cyst 2002 onset  . Anxiety   . Depression     "recent breakup with partner of 15 yrs"  . Adrenal insufficiency 04/23/2013    ??  . MS (multiple sclerosis)    Past Surgical History  Procedure Laterality Date  . Abdominal surgery  ~ 2007    some sort of pancreatic cyst drainage.   . Cholecystectomy      ~ age 2-laparoscopic  . Adenoidectomy    . 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 Eus N/A 04/14/2013    Procedure: UPPER ENDOSCOPIC ULTRASOUND (EUS) LINEAR;  Surgeon: Rachael Fee, MD;  Location: [facility];  Service: Endoscopy;  Laterality: N/A;   Family History  Problem Relation Age of Onset  . Lupus Neg Hx   . Sarcoidosis Neg Hx   . Pancreatitis Neg Hx   . Ataxia Neg Hx   . Chorea Neg Hx   . Dementia Neg Hx   . Mental retardation Neg Hx   . Migraines Neg Hx   . Multiple sclerosis Neg Hx   . Neurofibromatosis Neg Hx   . Neuropathy Neg Hx   . Parkinsonism Neg Hx   . Seizures Neg Hx   . Stroke Neg Hx   .  Colitis Maternal Aunt   . Diabetes Mother   . Diabetes Father   . Diabetes      many family members   History  Substance Use Topics  . Smoking status: Former Smoker -- 0.50 packs/day for 20 years    Types: Cigarettes    Quit date: 05/26/2013  . Smokeless tobacco: Never Used  . Alcohol Use: No   OB History    No data available     Review of Systems  All other systems reviewed and are negative.     Allergies  Amoxicillin; Penicillins; Latex; Morphine and related; and Buprenorphine hcl  Home Medications   Prior to Admission medications   Medication Sig Start Date End Date Taking? Authorizing Provider  cyclobenzaprine (FLEXERIL) 5 MG tablet Take 1 tablet (5 mg total) by mouth 3 (three) times daily as needed for muscle spasms. 09/25/14   Ranelle Oyster, MD  diazepam (VALIUM) 5 MG tablet Take 1 tablet (5 mg total) by mouth as directed. Take 1-2 tabs, 30-60 minutes prior to MRI 11/01/14   Ranelle Oyster, MD  lipase/protease/amylase (CREON-12/PANCREASE) 12000 UNITS CPEP capsule Take 2 capsules by mouth 3 (three) times daily with meals. 07/31/13   Catarina Hartshorn,  MD  ondansetron (ZOFRAN ODT) 4 MG disintegrating tablet 4mg  ODT q4 hours prn nausea/vomit 09/29/14   Jessica D Zehr, PA-C  Oxycodone HCl 10 MG TABS Take 1 tablet (10 mg total) by mouth every 6 (six) hours as needed (severe pain). 12/01/14   Jones Bales, NP  pantoprazole (PROTONIX) 40 MG tablet Take 1 tablet (40 mg total) by mouth daily. 09/29/14   Princella Pellegrini Zehr, PA-C  promethazine (PHENERGAN) 25 MG tablet Take 1 tablet (25 mg total) by mouth every 6 (six) hours as needed for nausea or vomiting. 09/29/14   Princella Pellegrini Zehr, PA-C  topiramate (TOPAMAX) 100 MG tablet Take 1 tablet (100 mg total) by mouth daily. 09/25/14   Ranelle Oyster, MD  traMADol (ULTRAM) 50 MG tablet Take 1 tablet (50 mg total) by mouth every 6 (six) hours as needed for moderate pain. 12/01/14   Jones Bales, NP  Triamcinolone Acetonide (TRIAMCINOLONE 0.1 %  CREAM : EUCERIN) CREA Apply 1 application topically 3 (three) times daily. 02/23/14   Jacquelynn Cree, PA-C   BP 92/75 mmHg  Pulse 77  Resp 18  SpO2 100%  LMP 11/26/2014 Physical Exam  Constitutional: She is oriented to person, place, and time. She appears well-developed and well-nourished.  Cardiovascular: Normal rate.   Pulmonary/Chest: Effort normal and breath sounds normal.  Abdominal: Soft. Bowel sounds are normal.  Epigastric tenderness  Musculoskeletal: Normal range of motion.  Neurological: She is alert and oriented to person, place, and time.  Skin: Skin is warm and dry.  Psychiatric: Her behavior is normal.  Nursing note and vitals reviewed.   ED Course  Procedures (including critical care time) Labs Review Labs Reviewed  COMPREHENSIVE METABOLIC PANEL - Abnormal; Notable for the following:    Chloride 114 (*)    All other components within normal limits  CBC WITH DIFFERENTIAL/PLATELET - Abnormal; Notable for the following:    HCT 35.8 (*)    All other components within normal limits  URINALYSIS, ROUTINE W REFLEX MICROSCOPIC - Abnormal; Notable for the following:    APPearance CLOUDY (*)    Hgb urine dipstick LARGE (*)    Leukocytes, UA SMALL (*)    All other components within normal limits  URINE MICROSCOPIC-ADD ON - Abnormal; Notable for the following:    Squamous Epithelial / LPF FEW (*)    Casts HYALINE CASTS (*)    All other components within normal limits  URINE CULTURE  LIPASE, BLOOD  PREGNANCY, URINE    Imaging Review No results found.   EKG Interpretation None      MDM   Final diagnoses:  Pain of upper abdomen    Pt feeling a little better at this time. Urine sent for culture no definite uti. Don't think imaging is needed at this time. Pt is on pain contract don't think any imaging is needed at this time    Teressa Lower, NP 12/02/14 1051  Glynn Octave, MD 12/02/14 [phone]

## 2014-12-04 LAB — URINE CULTURE

## 2014-12-10 ENCOUNTER — Other Ambulatory Visit: Payer: Self-pay | Admitting: Physical Medicine & Rehabilitation

## 2014-12-11 ENCOUNTER — Other Ambulatory Visit: Payer: Self-pay | Admitting: Physical Medicine & Rehabilitation

## 2014-12-11 ENCOUNTER — Other Ambulatory Visit: Payer: Self-pay | Admitting: *Deleted

## 2014-12-11 DIAGNOSIS — R1013 Epigastric pain: Secondary | ICD-10-CM

## 2014-12-11 DIAGNOSIS — M792 Neuralgia and neuritis, unspecified: Secondary | ICD-10-CM

## 2014-12-11 DIAGNOSIS — G35 Multiple sclerosis: Secondary | ICD-10-CM

## 2014-12-11 MED ORDER — TOPIRAMATE 100 MG PO TABS: 100.0000 mg | ORAL_TABLET | Freq: Every day | ORAL | Status: DC

## 2014-12-11 NOTE — Telephone Encounter (Signed)
Recd electronic refill request for Topamax - 100 mg tablets #30 RF# 3 - sent in electronically

## 2014-12-13 ENCOUNTER — Telehealth: Payer: Self-pay | Admitting: Neurology

## 2014-12-13 ENCOUNTER — Other Ambulatory Visit (HOSPITAL_COMMUNITY): Payer: Self-pay | Admitting: Neurology

## 2014-12-13 DIAGNOSIS — G35 Multiple sclerosis: Secondary | ICD-10-CM

## 2014-12-13 NOTE — Telephone Encounter (Signed)
This patient has not been seen since 03/2015  She has been referred to the [facility] for continued treatment . This was an old from 03/2015

## 2014-12-13 NOTE — Telephone Encounter (Signed)
Mary [name] w/ [facility] is apparently pulling imaging info from a referral work-q. She has called on this one requesting clinical info. The patient has not been seen here since 2015 and it looks like the order was placed back in 2015. Does the paient really need to get this done? Is this a repeat MRI/ What is dx?   Please let me know and  I will get with Pattricia Boss. / Sherri S.

## 2014-12-13 NOTE — Telephone Encounter (Signed)
    Expand All Collapse All   Mary [name] w/ [facility] is apparently pulling imaging info from a referral work-q as it does not look like this is a new order but the test is scheduled for tomorrow. She has called on this one requesting clinical info. The patient has not been seen here since 2015 and it looks like the order was placed back in 2015. Does the paient really need to get this done? Is this a repeat MRI/ What is dx?   Please let me know and I will get with Mary [name]. / Sherri S.

## 2014-12-13 NOTE — Telephone Encounter (Signed)
I have called Mary [name] to make her aware that this is an old order. She will not do anythig further regarding the precert. Can you call [facility] and cancel the appt with them? We may need to alert the patient as well/ Sherri S.

## 2014-12-14 ENCOUNTER — Ambulatory Visit (HOSPITAL_COMMUNITY): Payer: Medicaid Other

## 2014-12-14 NOTE — Telephone Encounter (Signed)
Mary [name] long is aware that is has been cancelled

## 2014-12-29 ENCOUNTER — Other Ambulatory Visit: Payer: Self-pay | Admitting: Physical Medicine & Rehabilitation

## 2014-12-29 ENCOUNTER — Encounter: Payer: Self-pay | Admitting: Physical Medicine & Rehabilitation

## 2014-12-29 ENCOUNTER — Encounter: Payer: Medicaid Other | Attending: Physical Medicine & Rehabilitation | Admitting: Physical Medicine & Rehabilitation

## 2014-12-29 VITALS — BP 132/78 | HR 68 | Resp 14

## 2014-12-29 DIAGNOSIS — M545 Low back pain: Secondary | ICD-10-CM | POA: Insufficient documentation

## 2014-12-29 DIAGNOSIS — G35 Multiple sclerosis: Secondary | ICD-10-CM

## 2014-12-29 DIAGNOSIS — Z5181 Encounter for therapeutic drug level monitoring: Secondary | ICD-10-CM | POA: Diagnosis not present

## 2014-12-29 DIAGNOSIS — Z79899 Other long term (current) drug therapy: Secondary | ICD-10-CM | POA: Diagnosis not present

## 2014-12-29 DIAGNOSIS — G8191 Hemiplegia, unspecified affecting right dominant side: Secondary | ICD-10-CM

## 2014-12-29 DIAGNOSIS — G894 Chronic pain syndrome: Secondary | ICD-10-CM | POA: Diagnosis not present

## 2014-12-29 DIAGNOSIS — M792 Neuralgia and neuritis, unspecified: Secondary | ICD-10-CM | POA: Diagnosis not present

## 2014-12-29 DIAGNOSIS — G8190 Hemiplegia, unspecified affecting unspecified side: Secondary | ICD-10-CM

## 2014-12-29 DIAGNOSIS — K861 Other chronic pancreatitis: Secondary | ICD-10-CM

## 2014-12-29 DIAGNOSIS — K859 Acute pancreatitis without necrosis or infection, unspecified: Secondary | ICD-10-CM

## 2014-12-29 DIAGNOSIS — R1013 Epigastric pain: Secondary | ICD-10-CM

## 2014-12-29 HISTORY — DX: Multiple sclerosis: G35

## 2014-12-29 MED ORDER — OXYCODONE HCL 10 MG PO TABS: 10.0000 mg | ORAL_TABLET | Freq: Four times a day (QID) | ORAL | Status: DC | PRN

## 2014-12-29 MED ORDER — TRAMADOL HCL 50 MG PO TABS: 50.0000 mg | ORAL_TABLET | Freq: Four times a day (QID) | ORAL | Status: DC | PRN

## 2014-12-29 MED ORDER — TOPIRAMATE 100 MG PO TABS: 100.0000 mg | ORAL_TABLET | ORAL | Status: DC

## 2014-12-29 NOTE — Patient Instructions (Signed)
CONTACT [facility] REGARDING YOUR THORACIC MRI

## 2014-12-29 NOTE — Progress Notes (Signed)
Subjective:    Patient ID: Mary [name], female    DOB: June 27, 1976, 39 y.o.   MRN: [id number]  HPI   Mary [name] is here in follow up of her MS and chronic pain. She saw the specialist at [facility] who ordered an MRI which was apparently denied by [facility]. Chayanne complains of a band-like sensation over her mid back. There is some concern that she may have a lesion in the thoracic cord.   She did have her lumbar MRI in march which revealed the following:  L2-L3: Negative except for mild facet hypertrophy.  L3-L4: Minimal disc bulge. Mild facet hypertrophy. No stenosis.  L4-L5: Negative disc. Moderate facet hypertrophy. Mild ligament flavum hypertrophy. No stenosis.  L5-S1: Disc desiccation and mild disc space loss. Mild disc bulge. Mild facet hypertrophy. No spinal or lateral recess stenosis. No significant foraminal stenosis  I had been most interested in looking for a source for her "sciatic", but as noted above there is no stenosis. She is having some low back pain however.   The topamax has helped her headaches, but she is still having a headache frequently around 4-5pm in the afternoon. She notes fatigue, blurred vision typically associated with these headaches. Sometimes she will have to lay down and wait for the headache to go away.   She remains on the oxycodone and tramadol as prescribed.    Pain Inventory Average Pain 7 Pain Right Now 6 My pain is constant, dull, tingling and aching  In the last 24 hours, has pain interfered with the following? General activity 5 Relation with others 5 Enjoyment of life 5 What TIME of day is your pain at its worst? daytime  And evening Sleep (in general) Poor  Pain is worse with: walking, bending, sitting, standing and some activites Pain improves with: rest and medication Relief from Meds: 4  Mobility walk without assistance how many minutes can you walk? 60 ability to climb steps?  yes do you drive?  yes  Function disabled:  date disabled .  Neuro/Psych weakness tingling spasms depression anxiety  Prior Studies Any changes since last visit?  no  Physicians involved in your care Any changes since last visit?  no   Family History  Problem Relation Age of Onset  . Lupus Neg Hx   . Sarcoidosis Neg Hx   . Pancreatitis Neg Hx   . Ataxia Neg Hx   . Chorea Neg Hx   . Dementia Neg Hx   . Mental retardation Neg Hx   . Migraines Neg Hx   . Multiple sclerosis Neg Hx   . Neurofibromatosis Neg Hx   . Neuropathy Neg Hx   . Parkinsonism Neg Hx   . Seizures Neg Hx   . Stroke Neg Hx   . Colitis Maternal Aunt   . Diabetes Mother   . Diabetes Father   . Diabetes      many family members   History   Social History  . Marital Status: Single    Spouse Name: N/A  . Number of Children: N/A  . Years of Education: N/A   Social History Main Topics  . Smoking status: Former Smoker -- 0.50 packs/day for 20 years    Types: Cigarettes    Quit date: 05/26/2013  . Smokeless tobacco: Never Used  . Alcohol Use: No  . Drug Use: No  . Sexual Activity: No   Other Topics Concern  . None   Social History Narrative   Lives permanently in [place]  with mom and stepfather.  Has not started working yet and was unemployed in [place].            Past Surgical History  Procedure Laterality Date  . Abdominal surgery  ~ 2007    some sort of pancreatic cyst drainage.   . Cholecystectomy      ~ age 58-laparoscopic  . Adenoidectomy    . 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 Eus N/A 04/14/2013    Procedure: UPPER ENDOSCOPIC ULTRASOUND (EUS) LINEAR;  Surgeon: Rachael Fee, MD;  Location: [facility];  Service: Endoscopy;  Laterality: N/A;   Past Medical History  Diagnosis Date  . Hernia 03-24-13    ventral hernia remains   . GERD (gastroesophageal reflux disease)   . Pancreatitis 03-24-13    2002, 2'2013, 03-14-13  . Pancreatic cyst 2002 onset  . Anxiety   .  Depression     "recent breakup with partner of 15 yrs"  . Adrenal insufficiency 04/23/2013    ??  . MS (multiple sclerosis)    BP 132/78 mmHg  Pulse 68  Resp 14  SpO2 100%  LMP 11/26/2014  Opioid Risk Score:   Fall Risk Score: Low Fall Risk (0-5 points)`1  Depression screen PHQ 2/9  Depression screen PHQ 2/9 11/01/2014  Decreased Interest 2  Down, Depressed, Hopeless 2  PHQ - 2 Score 4  Altered sleeping 1  Tired, decreased energy 1  Change in appetite 3  Feeling bad or failure about yourself  0  Trouble concentrating 1  Moving slowly or fidgety/restless 0  PHQ-9 Score 10     Review of Systems  HENT: Negative.   Eyes: Negative.   Respiratory: Negative.   Cardiovascular: Negative.   Gastrointestinal: Negative.   Endocrine: Negative.   Genitourinary: Negative.   Musculoskeletal: Positive for myalgias, back pain and arthralgias.  Skin: Negative.   Allergic/Immunologic: Negative.   Neurological: Positive for weakness.       Tingling, spasms  Hematological: Negative.   Psychiatric/Behavioral: Positive for dysphoric mood.       Objective:   Physical Exam  General: Alert and oriented x 3, No apparent distress. obese  HEENT: Head is normocephalic, atraumatic, PERRLA, EOMI, sclera anicteric, oral mucosa pink and moist, dentition intact, ext ear canals clear,  Neck: Supple without JVD or lymphadenopathy  Heart: Reg rate and rhythm. No murmurs rubs or gallops  Chest: CTA bilaterally without wheezes, rales, or rhonchi; no distress  Abdomen: Soft, non-tender, non-distended, bowel sounds positive.  Extremities: No clubbing, cyanosis, or edema. Pulses are 2+  Skin: Clean and intact without signs of breakdown  Neuro: Pt is cognitively appropriate with normal insight and awareness. Sometimes memory and attention can be limited. Cranial nerves 2-12 are intact. Sensory exam is slightly decreased on the right. Reflexes are 3+ right and 2+ left. Strength 4/5 rue prox to distal. 3+  to 4/5 RLE in all muscle groups in inconsistent pattern. Fine motor coordination is decreased RLE and RUE. She struggles with clearance of the right foot during swing phase of gait. . No tremors. .  Musculoskeletal: decreased grip and ROM in the low back  Low back tender with flexion, extension and rotation. SLR +/equivocal on right. Hamstrings tight. mid back paraspinals taut still. Psych: Pt's affect is appropriate. Pt is cooperative    Assessment & Plan:   1. 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  2. 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 Low back pain with ? Radiculopathy RLE  4. Chronic pancreatitis    Plan:  1. Increase topamax to  daily and  pm for headaches and neuropathic pain. Discussed the need for adequate sleep. She may benefit from a scheduled nap in the late afternoon also.  2. Tramadol #60 were written today to help with neuro/abd pain  3. Oxycodone was refilled.  4. Lumbar facet arthropathy 5. Continue with AFO for gait and back stabilization.  6. Pancreatitis management per PCP and gastroenterologist.  7. Neuro work up at [facility] her to follow up with [facility] neurology regarding the thoracic MRI. 8. Follow up with [name] in about a month with [name] or NP. of face to face patient care time were spent during this visit. All questions were encouraged and answered.

## 2014-12-30 LAB — PMP ALCOHOL METABOLITE (ETG): Ethyl Glucuronide (EtG): NEGATIVE ng/mL

## 2015-01-04 LAB — TRAMADOL, URINE
N-DESMETHYL-CIS-TRAMADOL: 6496 ng/mL (ref <100)
TRAMADOL, URINE: 13972 ng/mL (ref <100)

## 2015-01-04 LAB — BENZODIAZEPINES (GC/LC/MS), URINE
Alprazolam metabolite (GC/LC/MS), ur confirm: NEGATIVE ng/mL (ref <25)
Clonazepam metabolite (GC/LC/MS), ur confirm: 201 ng/mL (ref <25)
Flurazepam metabolite (GC/LC/MS), ur confirm: NEGATIVE ng/mL (ref <50)
LORAZEPAMU: NEGATIVE ng/mL (ref <50)
MIDAZOLAMU: NEGATIVE ng/mL (ref <50)
NORDIAZEPAMU: NEGATIVE ng/mL (ref <50)
OXAZEPAMU: NEGATIVE ng/mL (ref <50)
TEMAZEPAMU: NEGATIVE ng/mL (ref <50)
Triazolam metabolite (GC/LC/MS), ur confirm: NEGATIVE ng/mL (ref <50)

## 2015-01-04 LAB — OPIATES/OPIOIDS (LC/MS-MS)
Codeine Urine: NEGATIVE ng/mL (ref <50)
HYDROCODONE: NEGATIVE ng/mL (ref <50)
HYDROMORPHONE: NEGATIVE ng/mL (ref <50)
Morphine Urine: NEGATIVE ng/mL (ref <50)
NORHYDROCODONE, UR: NEGATIVE ng/mL (ref <50)
Noroxycodone, Ur: 1039 ng/mL (ref <50)
OXYCODONE, UR: 521 ng/mL (ref <50)
Oxymorphone: 2074 ng/mL (ref <50)

## 2015-01-04 LAB — OXYCODONE, URINE (LC/MS-MS)
Noroxycodone, Ur: 1039 ng/mL (ref <50)
Oxycodone, ur: 521 ng/mL (ref <50)
Oxymorphone: 2074 ng/mL (ref <50)

## 2015-01-05 LAB — PRESCRIPTION MONITORING PROFILE (SOLSTAS)
Amphetamine/Meth: NEGATIVE ng/mL
BUPRENORPHINE, URINE: NEGATIVE ng/mL
Barbiturate Screen, Urine: NEGATIVE ng/mL
CANNABINOID SCRN UR: NEGATIVE ng/mL
CREATININE, URINE: 127.52 mg/dL (ref >20.0)
Carisoprodol, Urine: NEGATIVE ng/mL
Cocaine Metabolites: NEGATIVE ng/mL
FENTANYL URINE: NEGATIVE ng/mL
MDMA URINE: NEGATIVE ng/mL
Meperidine, Ur: NEGATIVE ng/mL
Methadone Screen, Urine: NEGATIVE ng/mL
NITRITES URINE, INITIAL: NEGATIVE ug/mL
PROPOXYPHENE: NEGATIVE ng/mL
Tapentadol, urine: NEGATIVE ng/mL
ZOLPIDEM, URINE: NEGATIVE ng/mL
pH, Initial: 5.5 pH (ref 4.5–8.9)

## 2015-01-17 NOTE — Progress Notes (Signed)
Urine drug screen for this encounter is consistent for prescribed medication 

## 2015-01-19 ENCOUNTER — Encounter (HOSPITAL_COMMUNITY): Payer: Self-pay | Admitting: Emergency Medicine

## 2015-01-19 ENCOUNTER — Ambulatory Visit (HOSPITAL_COMMUNITY)
Admission: RE | Admit: 2015-01-19 | Discharge: 2015-01-19 | Disposition: A | Discharge status: Home / Self Care | Payer: Medicaid Other | Source: Ambulatory Visit | Attending: Neurology | Admitting: Neurology

## 2015-01-19 ENCOUNTER — Emergency Department (HOSPITAL_COMMUNITY)
Admission: EM | Admit: 2015-01-19 | Discharge: 2015-01-19 | Disposition: A | Discharge status: Home / Self Care | Payer: Medicaid Other | Attending: Emergency Medicine | Admitting: Emergency Medicine

## 2015-01-19 DIAGNOSIS — F419 Anxiety disorder, unspecified: Secondary | ICD-10-CM | POA: Insufficient documentation

## 2015-01-19 DIAGNOSIS — E279 Disorder of adrenal gland, unspecified: Secondary | ICD-10-CM | POA: Diagnosis not present

## 2015-01-19 DIAGNOSIS — K219 Gastro-esophageal reflux disease without esophagitis: Secondary | ICD-10-CM | POA: Diagnosis not present

## 2015-01-19 DIAGNOSIS — Z79899 Other long term (current) drug therapy: Secondary | ICD-10-CM | POA: Diagnosis not present

## 2015-01-19 DIAGNOSIS — R262 Difficulty in walking, not elsewhere classified: Secondary | ICD-10-CM | POA: Insufficient documentation

## 2015-01-19 DIAGNOSIS — Z8669 Personal history of other diseases of the nervous system and sense organs: Secondary | ICD-10-CM | POA: Insufficient documentation

## 2015-01-19 DIAGNOSIS — R200 Anesthesia of skin: Secondary | ICD-10-CM | POA: Insufficient documentation

## 2015-01-19 DIAGNOSIS — Z880 Allergy status to penicillin: Secondary | ICD-10-CM | POA: Insufficient documentation

## 2015-01-19 DIAGNOSIS — Z3202 Encounter for pregnancy test, result negative: Secondary | ICD-10-CM | POA: Insufficient documentation

## 2015-01-19 DIAGNOSIS — G35 Multiple sclerosis: Secondary | ICD-10-CM | POA: Diagnosis not present

## 2015-01-19 DIAGNOSIS — Z91040 Latex allergy status: Secondary | ICD-10-CM | POA: Insufficient documentation

## 2015-01-19 DIAGNOSIS — Z8639 Personal history of other endocrine, nutritional and metabolic disease: Secondary | ICD-10-CM | POA: Diagnosis not present

## 2015-01-19 DIAGNOSIS — R109 Unspecified abdominal pain: Secondary | ICD-10-CM | POA: Diagnosis present

## 2015-01-19 DIAGNOSIS — R202 Paresthesia of skin: Secondary | ICD-10-CM | POA: Diagnosis not present

## 2015-01-19 DIAGNOSIS — F329 Major depressive disorder, single episode, unspecified: Secondary | ICD-10-CM | POA: Insufficient documentation

## 2015-01-19 DIAGNOSIS — R1013 Epigastric pain: Secondary | ICD-10-CM | POA: Insufficient documentation

## 2015-01-19 DIAGNOSIS — R112 Nausea with vomiting, unspecified: Secondary | ICD-10-CM | POA: Insufficient documentation

## 2015-01-19 DIAGNOSIS — Z87891 Personal history of nicotine dependence: Secondary | ICD-10-CM | POA: Diagnosis not present

## 2015-01-19 DIAGNOSIS — K862 Cyst of pancreas: Secondary | ICD-10-CM | POA: Diagnosis not present

## 2015-01-19 DIAGNOSIS — M546 Pain in thoracic spine: Secondary | ICD-10-CM | POA: Diagnosis not present

## 2015-01-19 LAB — URINALYSIS, ROUTINE W REFLEX MICROSCOPIC
Glucose, UA: NEGATIVE mg/dL
Hgb urine dipstick: NEGATIVE
Ketones, ur: NEGATIVE mg/dL
Leukocytes, UA: NEGATIVE
Nitrite: NEGATIVE
PROTEIN: NEGATIVE mg/dL
Specific Gravity, Urine: 1.021 (ref 1.005–1.030)
Urobilinogen, UA: 1 mg/dL (ref 0.0–1.0)
pH: 6 (ref 5.0–8.0)

## 2015-01-19 LAB — CBC WITH DIFFERENTIAL/PLATELET
Basophils Absolute: 0 10*3/uL (ref 0.0–0.1)
Basophils Relative: 1 % (ref 0–1)
EOS PCT: 4 % (ref 0–5)
Eosinophils Absolute: 0.2 10*3/uL (ref 0.0–0.7)
HEMATOCRIT: 37.5 % (ref 36.0–46.0)
Hemoglobin: 12.6 g/dL (ref 12.0–15.0)
LYMPHS PCT: 29 % (ref 12–46)
Lymphs Abs: 1.9 10*3/uL (ref 0.7–4.0)
MCH: 29.3 pg (ref 26.0–34.0)
MCHC: 33.6 g/dL (ref 30.0–36.0)
MCV: 87.2 fL (ref 78.0–100.0)
MONO ABS: 0.7 10*3/uL (ref 0.1–1.0)
Monocytes Relative: 11 % (ref 3–12)
Neutro Abs: 3.7 10*3/uL (ref 1.7–7.7)
Neutrophils Relative %: 55 % (ref 43–77)
PLATELETS: 254 10*3/uL (ref 150–400)
RBC: 4.3 MIL/uL (ref 3.87–5.11)
RDW: 13.9 % (ref 11.5–15.5)
WBC: 6.6 10*3/uL (ref 4.0–10.5)

## 2015-01-19 LAB — COMPREHENSIVE METABOLIC PANEL
ALT: 18 U/L (ref 14–54)
ANION GAP: 8 (ref 5–15)
AST: 19 U/L (ref 15–41)
Albumin: 4.4 g/dL (ref 3.5–5.0)
Alkaline Phosphatase: 92 U/L (ref 38–126)
BILIRUBIN TOTAL: 0.5 mg/dL (ref 0.3–1.2)
BUN: 13 mg/dL (ref 6–20)
CALCIUM: 9.2 mg/dL (ref 8.9–10.3)
CO2: 21 mmol/L — ABNORMAL LOW (ref 22–32)
CREATININE: 0.77 mg/dL (ref 0.44–1.00)
Chloride: 109 mmol/L (ref 101–111)
GFR calc Af Amer: >60 mL/min (ref >60)
Glucose, Bld: 82 mg/dL (ref 65–99)
Potassium: 3.6 mmol/L (ref 3.5–5.1)
Sodium: 138 mmol/L (ref 135–145)
Total Protein: 8.4 g/dL — ABNORMAL HIGH (ref 6.5–8.1)

## 2015-01-19 LAB — PREGNANCY, URINE: Preg Test, Ur: NEGATIVE

## 2015-01-19 LAB — LIPASE, BLOOD: LIPASE: 14 U/L — AB (ref 22–51)

## 2015-01-19 MED ORDER — SODIUM CHLORIDE 0.9 % IV BOLUS (SEPSIS)
1000.0000 mL | Freq: Once | INTRAVENOUS | Status: AC
Start: 2015-01-19 — End: 2015-01-19
  Administered 2015-01-19: 1000 mL via INTRAVENOUS

## 2015-01-19 MED ORDER — GADOBENATE DIMEGLUMINE 529 MG/ML IV SOLN
20.0000 mL | Freq: Once | INTRAVENOUS | Status: AC | PRN
  Administered 2015-01-19: 18 mL via INTRAVENOUS

## 2015-01-19 MED ORDER — ONDANSETRON HCL 4 MG/2ML IJ SOLN
4.0000 mg | Freq: Once | INTRAMUSCULAR | Status: AC
  Administered 2015-01-19: 4 mg via INTRAVENOUS
  Filled 2015-01-19: qty 2

## 2015-01-19 MED ORDER — HYDROMORPHONE HCL 2 MG/ML IJ SOLN
2.0000 mg | Freq: Once | INTRAMUSCULAR | Status: AC
  Administered 2015-01-19: 2 mg via INTRAVENOUS
  Filled 2015-01-19: qty 1

## 2015-01-19 MED ORDER — HYDROMORPHONE HCL 1 MG/ML IJ SOLN
1.0000 mg | Freq: Once | INTRAMUSCULAR | Status: AC
  Administered 2015-01-19: 1 mg via INTRAVENOUS
  Filled 2015-01-19: qty 1

## 2015-01-19 NOTE — ED Notes (Signed)
Pt c/o mid abd pain w/ NV since Tuesday.  Sent by dr for poss pancreatitis for which she has a hx of.

## 2015-01-19 NOTE — ED Notes (Signed)
Patient comes from home with c/o LLQ pain radiating to back for one week.  Patient endorses N/V and states she is unable to maintain PO intake.    On exam, patient's lung sounds are clear bilaterally.  Heart sounds WNL.  Patient appears pale, but well hydrated - no skin turgor, buccal mucosa is moist and pink.

## 2015-01-19 NOTE — ED Notes (Addendum)
PT requesting to leave IV inserted for her MRI due to difficult stick. Pt was made aware for liability issues her request can not be fulfilled. RN Manson Passey successfully attempted IV insertion on first attempt. Pt upset but allows [name] to remove IV. She then rushes RN through discharge and states " let's make this quick, because I got to go".

## 2015-01-19 NOTE — ED Notes (Addendum)
Pt alert, oriented, and ambulatory upon DC. She takes her laptop with her. She was offered a wheel chair to get to lobby however, she declines offer. Female visitor driving her. Pt thanks [name] for our care.

## 2015-01-19 NOTE — ED Provider Notes (Signed)
CSN: [id number]     Arrival date & time 01/19/15  1238 History   First MD Initiated Contact with Patient 01/19/15 1522     Chief Complaint  Patient presents with  . Abdominal Pain  . Nausea  . Emesis     (Consider location/radiation/quality/duration/timing/severity/associated sxs/prior Treatment) Patient is a 39 y.o. female presenting with abdominal pain.  Abdominal Pain Pain location:  Epigastric Pain quality: sharp   Pain radiates to:  Does not radiate Pain severity:  Severe Onset quality:  Gradual Duration:  1 week Timing:  Constant Progression:  Unchanged Chronicity:  Recurrent Context comment:  Reportedly with chronic idiopathic pancreatitis Relieved by:  Nothing Worsened by:  Nothing tried Ineffective treatments:  None tried Associated symptoms: diarrhea, nausea and vomiting   Associated symptoms: no chest pain, no cough and no dysuria     Past Medical History  Diagnosis Date  . Hernia 03-24-13    ventral hernia remains   . GERD (gastroesophageal reflux disease)   . Pancreatitis 03-24-13    2002, 2'2013, 03-14-13  . Pancreatic cyst 2002 onset  . Anxiety   . Depression     "recent breakup with partner of 15 yrs"  . Adrenal insufficiency 04/23/2013    ??  . MS (multiple sclerosis)    Past Surgical History  Procedure Laterality Date  . Abdominal surgery  ~ 2007    some sort of pancreatic cyst drainage.   . Cholecystectomy      ~ age 67-laparoscopic  . Adenoidectomy    . 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 Eus N/A 04/14/2013    Procedure: UPPER ENDOSCOPIC ULTRASOUND (EUS) LINEAR;  Surgeon: Rachael Fee, MD;  Location: [facility];  Service: Endoscopy;  Laterality: N/A;   Family History  Problem Relation Age of Onset  . Lupus Neg Hx   . Sarcoidosis Neg Hx   . Pancreatitis Neg Hx   . Ataxia Neg Hx   . Chorea Neg Hx   . Dementia Neg Hx   . Mental retardation Neg Hx   . Migraines Neg Hx   . Multiple  sclerosis Neg Hx   . Neurofibromatosis Neg Hx   . Neuropathy Neg Hx   . Parkinsonism Neg Hx   . Seizures Neg Hx   . Stroke Neg Hx   . Colitis Maternal Aunt   . Diabetes Mother   . Diabetes Father   . Diabetes      many family members   History  Substance Use Topics  . Smoking status: Former Smoker -- 0.50 packs/day for 20 years    Types: Cigarettes    Quit date: 05/26/2013  . Smokeless tobacco: Never Used  . Alcohol Use: No   OB History    No data available     Review of Systems  Respiratory: Negative for cough.   Cardiovascular: Negative for chest pain.  Gastrointestinal: Positive for nausea, vomiting, abdominal pain and diarrhea.  Genitourinary: Negative for dysuria.  All other systems reviewed and are negative.     Allergies  Amoxicillin; Penicillins; Latex; Morphine and related; and Buprenorphine hcl  Home Medications   Prior to Admission medications   Medication Sig Start Date End Date Taking? Authorizing Provider  clonazePAM (KLONOPIN) 0.5 MG tablet Take 0.25 mg by mouth 2 (two) times daily.  11/20/14 11/20/15 Yes Historical Provider, MD  cyclobenzaprine (FLEXERIL) 5 MG tablet TAKE 1 TABLET (5 MG TOTAL) BY MOUTH 3 (THREE)  TIMES DAILY AS NEEDED FOR MUSCLE SPASMS. 12/11/14  Yes Ranelle Oyster, MD  lipase/protease/amylase (CREON-12/PANCREASE) 12000 UNITS CPEP capsule Take 2 capsules by mouth 3 (three) times daily with meals. 07/31/13  Yes Catarina Hartshorn, MD  ondansetron (ZOFRAN ODT) 4 MG disintegrating tablet 4mg  ODT q4 hours prn nausea/vomit Patient taking differently: Take 4 mg by mouth every 4 (four) hours as needed for nausea or vomiting.  09/29/14  Yes Jessica D Zehr, PA-C  Oxycodone HCl 10 MG TABS Take 1 tablet (10 mg total) by mouth every 6 (six) hours as needed (severe pain). 12/29/14  Yes Ranelle Oyster, MD  pantoprazole (PROTONIX) 40 MG tablet Take 1 tablet (40 mg total) by mouth daily. 09/29/14  Yes Jessica D Zehr, PA-C  promethazine (PHENERGAN) 25 MG tablet Take  1 tablet (25 mg total) by mouth every 6 (six) hours as needed for nausea or vomiting. 09/29/14  Yes Jessica D Zehr, PA-C  sertraline (ZOLOFT) 50 MG tablet Take 50 mg by mouth daily. At 2000 01/03/15 01/02/18 Yes Historical Provider, MD  topiramate (TOPAMAX) 100 MG tablet Take 1 tablet (100 mg total) by mouth as directed. Take 100mg  (1 tab) in AM and 50mg  (1/2 tab) in PM. 12/29/14  Yes Ranelle Oyster, MD  traMADol (ULTRAM) 50 MG tablet Take 1 tablet (50 mg total) by mouth every 6 (six) hours as needed for moderate pain. 12/29/14  Yes Ranelle Oyster, MD  Triamcinolone Acetonide (TRIAMCINOLONE 0.1 % CREAM : EUCERIN) CREA Apply 1 application topically 3 (three) times daily. 02/23/14  Yes Evlyn Kanner Love, PA-C  diazepam (VALIUM) 2 MG tablet Take 2 mg by mouth once. Prior to test 01/19/15   Historical Provider, MD  topiramate (TOPAMAX) 100 MG tablet TAKE 1 TABLET (100 MG TOTAL) BY MOUTH DAILY. Patient not taking: Reported on 01/19/2015 12/11/14   Ranelle Oyster, MD   BP 118/85 mmHg  Pulse 68  Temp(Src) 98.1 F (36.7 C) (Oral)  Resp 16  SpO2 100%  LMP 01/15/2015 Physical Exam  Constitutional: She is oriented to person, place, and time. She appears well-developed and well-nourished.  HENT:  Head: Normocephalic and atraumatic.  Right Ear: External ear normal.  Left Ear: External ear normal.  Eyes: Conjunctivae and EOM are normal. Pupils are equal, round, and reactive to light.  Neck: Normal range of motion. Neck supple.  Cardiovascular: Normal rate, regular rhythm, normal heart sounds and intact distal pulses.   Pulmonary/Chest: Effort normal and breath sounds normal.  Abdominal: Soft. Bowel sounds are normal. There is tenderness in the epigastric area.  Musculoskeletal: Normal range of motion.  Neurological: She is alert and oriented to person, place, and time.  Skin: Skin is warm and dry.  Vitals reviewed.   ED Course  Procedures (including critical care time) Labs Review Labs Reviewed   COMPREHENSIVE METABOLIC PANEL - Abnormal; Notable for the following:    CO2 21 (*)    Total Protein 8.4 (*)    All other components within normal limits  LIPASE, BLOOD - Abnormal; Notable for the following:    Lipase 14 (*)    All other components within normal limits  URINALYSIS, ROUTINE W REFLEX MICROSCOPIC (NOT AT [facility]) - Abnormal; Notable for the following:    Color, Urine AMBER (*)    Bilirubin Urine SMALL (*)    All other components within normal limits  CBC WITH DIFFERENTIAL/PLATELET  PREGNANCY, URINE    Imaging Review No results found.   EKG Interpretation None  MDM   Final diagnoses:  Epigastric pain    39 y.o. female with pertinent PMH of reported chronic pancreatitis (had CT scan years ago with apparent pseudocyst), chronic abd pain, ms, seen by chronic pain clinic for back pain presents with recurrent abd pain.  States pain is identical to prior.  Exam as above on arrival.  Pt given 1 mg, followed by  dilaudid, states her pain is no better, but on my repeat examination she was walking around the room and smoking her e-cigarette.  Offered PO challenge, pt refused and requested to leave.  Feel she is competent to make this decision.  As labs are unremarkable, as well as vitals, do not feel imaging warranted at this time.  Likely chronic pain, dc home in stable condition.    I have reviewed all laboratory and imaging studies if ordered as above  1. Epigastric pain         Mirian Mo, MD 01/19/15 1924

## 2015-01-19 NOTE — Discharge Instructions (Signed)

## 2015-01-22 ENCOUNTER — Telehealth: Payer: Self-pay | Admitting: Internal Medicine

## 2015-01-22 NOTE — Telephone Encounter (Signed)
Received records from [facility] forwarded to Dr. Yancey Flemings 01/22/15 fbg.

## 2015-01-26 ENCOUNTER — Encounter: Payer: Medicaid Other | Attending: Physical Medicine & Rehabilitation | Admitting: Registered Nurse

## 2015-01-26 ENCOUNTER — Encounter: Payer: Self-pay | Admitting: Registered Nurse

## 2015-01-26 VITALS — BP 121/66 | HR 74 | Resp 16

## 2015-01-26 DIAGNOSIS — G35 Multiple sclerosis: Secondary | ICD-10-CM

## 2015-01-26 DIAGNOSIS — Z5181 Encounter for therapeutic drug level monitoring: Secondary | ICD-10-CM

## 2015-01-26 DIAGNOSIS — K859 Acute pancreatitis without necrosis or infection, unspecified: Secondary | ICD-10-CM

## 2015-01-26 DIAGNOSIS — M5416 Radiculopathy, lumbar region: Secondary | ICD-10-CM | POA: Diagnosis not present

## 2015-01-26 DIAGNOSIS — G8190 Hemiplegia, unspecified affecting unspecified side: Secondary | ICD-10-CM | POA: Diagnosis not present

## 2015-01-26 DIAGNOSIS — G8191 Hemiplegia, unspecified affecting right dominant side: Secondary | ICD-10-CM

## 2015-01-26 DIAGNOSIS — Z79899 Other long term (current) drug therapy: Secondary | ICD-10-CM

## 2015-01-26 DIAGNOSIS — K861 Other chronic pancreatitis: Secondary | ICD-10-CM | POA: Diagnosis not present

## 2015-01-26 DIAGNOSIS — M545 Low back pain: Secondary | ICD-10-CM | POA: Insufficient documentation

## 2015-01-26 DIAGNOSIS — G894 Chronic pain syndrome: Secondary | ICD-10-CM | POA: Insufficient documentation

## 2015-01-26 MED ORDER — OXYCODONE HCL 10 MG PO TABS: 10.0000 mg | ORAL_TABLET | Freq: Four times a day (QID) | ORAL | Status: DC | PRN

## 2015-01-26 NOTE — Progress Notes (Signed)
Subjective:    Patient ID: Mary [name], female    DOB: 1976-03-02, 39 y.o.   MRN: [id number]  HPI: Mary [name] was a 39 year old female who returns for follow up for chronic pain and medication refill. She says her pain is located in her right hand, mid-lower back radiating into right lower extremity and right foot. She rates her pain 8. Her current exercise regime is going to the gym two-three times a week using elliptical and strengthening training.  Pain Inventory Average Pain 8 Pain Right Now 8 My pain is constant, sharp, stabbing and tingling  In the last 24 hours, has pain interfered with the following? General activity 7 Relation with others 2 Enjoyment of life 4 What TIME of day is your pain at its worst? morning and evening Sleep (in general) Poor  Pain is worse with: some activites Pain improves with: medication Relief from Meds: 7  Mobility walk without assistance walk with assistance use a cane ability to climb steps?  yes do you drive?  yes  Function disabled: date disabled .  Neuro/Psych numbness tingling dizziness depression anxiety  Prior Studies Any changes since last visit?  no  Physicians involved in your care Any changes since last visit?  no   Family History  Problem Relation Age of Onset  . Lupus Neg Hx   . Sarcoidosis Neg Hx   . Pancreatitis Neg Hx   . Ataxia Neg Hx   . Chorea Neg Hx   . Dementia Neg Hx   . Mental retardation Neg Hx   . Migraines Neg Hx   . Multiple sclerosis Neg Hx   . Neurofibromatosis Neg Hx   . Neuropathy Neg Hx   . Parkinsonism Neg Hx   . Seizures Neg Hx   . Stroke Neg Hx   . Colitis Maternal Aunt   . Diabetes Mother   . Diabetes Father   . Diabetes      many family members   History   Social History  . Marital Status: Single    Spouse Name: N/A  . Number of Children: N/A  . Years of Education: N/A   Social History Main Topics  . Smoking status: Former Smoker -- 0.50 packs/day for 20  years    Types: Cigarettes    Quit date: 05/26/2013  . Smokeless tobacco: Never Used  . Alcohol Use: No  . Drug Use: No  . Sexual Activity: No   Other Topics Concern  . None   Social History Narrative   Lives permanently in [place] with mom and stepfather.  Has not started working yet and was unemployed in [place].            Past Surgical History  Procedure Laterality Date  . Abdominal surgery  ~ 2007    some sort of pancreatic cyst drainage.   . Cholecystectomy      ~ age 76-laparoscopic  . Adenoidectomy    . 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 Eus N/A 04/14/2013    Procedure: UPPER ENDOSCOPIC ULTRASOUND (EUS) LINEAR;  Surgeon: Rachael Fee, MD;  Location: [facility];  Service: Endoscopy;  Laterality: N/A;   Past Medical History  Diagnosis Date  . Hernia 03-24-13    ventral hernia remains   . GERD (gastroesophageal reflux disease)   . Pancreatitis 03-24-13    2002, 2'2013, 03-14-13  . Pancreatic cyst 2002 onset  .  Anxiety   . Depression     "recent breakup with partner of 15 yrs"  . Adrenal insufficiency 04/23/2013    ??  . MS (multiple sclerosis)    BP 121/66 mmHg  Pulse 74  Resp 16  SpO2 98%  LMP 01/15/2015  Opioid Risk Score:   Fall Risk Score: Low Fall Risk (0-5 points)`1  Depression screen PHQ 2/9  Depression screen PHQ 2/9 11/01/2014  Decreased Interest 2  Down, Depressed, Hopeless 2  PHQ - 2 Score 4  Altered sleeping 1  Tired, decreased energy 1  Change in appetite 3  Feeling bad or failure about yourself  0  Trouble concentrating 1  Moving slowly or fidgety/restless 0  PHQ-9 Score 10    Review of Systems  Neurological: Positive for dizziness and numbness.       Tingling  Psychiatric/Behavioral: Positive for dysphoric mood. The patient is nervous/anxious.   All other systems reviewed and are negative.      Objective:   Physical Exam  Constitutional: She is oriented to person, place,  and time. She appears well-developed and well-nourished.  HENT:  Head: Normocephalic and atraumatic.  Neck: Normal range of motion. Neck supple.  Cardiovascular: Normal rate and regular rhythm.   Pulmonary/Chest: Effort normal and breath sounds normal.  Musculoskeletal:  Normal Muscle Bulk and Muscle Testing Reveals: Upper Extremities: Right: Decreased ROM 90 Degrees and Muscle Strength 3/5: Left: Full ROM and Muscle Strength 5/5 Right Rhomboid Tenderness Lumbar Paraspinal Tenderness: L-3- L-5 Sacral Tenderness Lower Extremities: Left: Full ROM and Muscle Strength 5/5 Right: Full ROM and Muscle Strength 4/5. Right Lower Extremity Flexion Produces pain into ankle and foot Arises from chair with ease Narrow Based gait  Neurological: She is alert and oriented to person, place, and time.  Skin: Skin is warm and dry.  Psychiatric: She has a normal mood and affect.  Nursing note and vitals reviewed.         Assessment & Plan:  1. MS: Mary [name] Neurology Following 2. 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 3. Low back pain with Radiculopathy RLE/: Continued  topamax.  4. Chronic pancreatitis: PCP and Gastroenterologist Following  of face to face patient care time was spent during this visit. All questions were encouraged and answered.

## 2015-02-12 DIAGNOSIS — R531 Weakness: Secondary | ICD-10-CM | POA: Insufficient documentation

## 2015-02-12 DIAGNOSIS — G988 Other disorders of nervous system: Secondary | ICD-10-CM | POA: Insufficient documentation

## 2015-02-12 DIAGNOSIS — R29818 Other symptoms and signs involving the nervous system: Secondary | ICD-10-CM | POA: Insufficient documentation

## 2015-02-12 DIAGNOSIS — IMO0002 Reserved for concepts with insufficient information to code with codable children: Secondary | ICD-10-CM | POA: Insufficient documentation

## 2015-02-15 ENCOUNTER — Other Ambulatory Visit: Payer: Self-pay | Admitting: Gastroenterology

## 2015-02-27 ENCOUNTER — Encounter: Payer: Self-pay | Admitting: Physical Medicine & Rehabilitation

## 2015-02-27 ENCOUNTER — Encounter: Payer: Medicaid Other | Attending: Physical Medicine & Rehabilitation | Admitting: Physical Medicine & Rehabilitation

## 2015-02-27 VITALS — HR 90 | Resp 14

## 2015-02-27 DIAGNOSIS — M5416 Radiculopathy, lumbar region: Secondary | ICD-10-CM

## 2015-02-27 DIAGNOSIS — K861 Other chronic pancreatitis: Secondary | ICD-10-CM | POA: Diagnosis not present

## 2015-02-27 DIAGNOSIS — M792 Neuralgia and neuritis, unspecified: Secondary | ICD-10-CM

## 2015-02-27 DIAGNOSIS — M545 Low back pain: Secondary | ICD-10-CM | POA: Insufficient documentation

## 2015-02-27 DIAGNOSIS — R1013 Epigastric pain: Secondary | ICD-10-CM

## 2015-02-27 DIAGNOSIS — G894 Chronic pain syndrome: Secondary | ICD-10-CM | POA: Diagnosis not present

## 2015-02-27 DIAGNOSIS — G35 Multiple sclerosis: Secondary | ICD-10-CM | POA: Insufficient documentation

## 2015-02-27 DIAGNOSIS — G8190 Hemiplegia, unspecified affecting unspecified side: Secondary | ICD-10-CM

## 2015-02-27 DIAGNOSIS — G8191 Hemiplegia, unspecified affecting right dominant side: Secondary | ICD-10-CM

## 2015-02-27 MED ORDER — TOPIRAMATE 100 MG PO TABS: 50.0000 mg | ORAL_TABLET | Freq: Two times a day (BID) | ORAL | Status: DC

## 2015-02-27 MED ORDER — TRAMADOL HCL 50 MG PO TABS: 50.0000 mg | ORAL_TABLET | Freq: Four times a day (QID) | ORAL | Status: DC | PRN

## 2015-02-27 MED ORDER — OXYCODONE HCL 10 MG PO TABS: 10.0000 mg | ORAL_TABLET | Freq: Four times a day (QID) | ORAL | Status: DC | PRN

## 2015-02-27 NOTE — Patient Instructions (Signed)
PLEASE CALL ME WITH ANY PROBLEMS OR QUESTIONS (#[phone]).  HAVE A GOOD DAY!    

## 2015-02-27 NOTE — Progress Notes (Signed)
Subjective:    Patient ID: Mary [name], female    DOB: [birth year], 39 y.o.   MRN: [id number]  HPI   Mary [name] is here in follow up of her chronic pain. She was involved in a fundraiser this weekend which left her sore!  She is trying to stay active in general. She reports no change in her MS since I last saw her. She was referred to rheumatology regarding positive lab tests (?)----no new dx was found---being referred now to another rheumatologist for second opinion.   We adjusted her topamax last month and added tramadol for neuro-abdominal pain. She feels things are close to the same in regard to her pain levels. She knows when she misses a dose as her pain will increase.   She doesn't follow up with GI until the Fall.   Pain Inventory Average Pain 6 Pain Right Now 8 My pain is constant, sharp, stabbing and aching  In the last 24 hours, has pain interfered with the following? General activity 8 Relation with others 2 Enjoyment of life 8 What TIME of day is your pain at its worst? ALL Sleep (in general) Fair  Pain is worse with: walking, bending, standing and some activites Pain improves with: medication Relief from Meds: 5  Mobility walk without assistance how many minutes can you walk? 5 ability to climb steps?  yes do you drive?  yes  Function disabled: date disabled .  Neuro/Psych numbness tingling trouble walking depression anxiety  Prior Studies Any changes since last visit?  no  Physicians involved in your care Any changes since last visit?  no   Family History  Problem Relation Age of Onset  . Lupus Neg Hx   . Sarcoidosis Neg Hx   . Pancreatitis Neg Hx   . Ataxia Neg Hx   . Chorea Neg Hx   . Dementia Neg Hx   . Mental retardation Neg Hx   . Migraines Neg Hx   . Multiple sclerosis Neg Hx   . Neurofibromatosis Neg Hx   . Neuropathy Neg Hx   . Parkinsonism Neg Hx   . Seizures Neg Hx   . Stroke Neg Hx   . Colitis Maternal Aunt   . Diabetes  Mother   . Diabetes Father   . Diabetes      many family members   History   Social History  . Marital Status: Single    Spouse Name: N/A  . Number of Children: N/A  . Years of Education: N/A   Social History Main Topics  . Smoking status: Former Smoker -- 0.50 packs/day for 20 years    Types: Cigarettes    Quit date: 05/26/2013  . Smokeless tobacco: Never Used  . Alcohol Use: No  . Drug Use: No  . Sexual Activity: No   Other Topics Concern  . None   Social History Narrative   Lives permanently in [place] with mom and stepfather.  Has not started working yet and was unemployed in [place].            Past Surgical History  Procedure Laterality Date  . Abdominal surgery  ~ 2007    some sort of pancreatic cyst drainage.   . Cholecystectomy      ~ age 80-laparoscopic  . Adenoidectomy    . 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 Eus N/A 04/14/2013    Procedure: UPPER ENDOSCOPIC ULTRASOUND (EUS) LINEAR;  Surgeon: Rachael Fee, MD;  Location: [facility];  Service: Endoscopy;  Laterality: N/A;   Past Medical History  Diagnosis Date  . Hernia 03-24-13    ventral hernia remains   . GERD (gastroesophageal reflux disease)   . Pancreatitis 03-24-13    2002, 2'2013, 03-14-13  . Pancreatic cyst 2002 onset  . Anxiety   . Depression     "recent breakup with partner of 15 yrs"  . Adrenal insufficiency 04/23/2013    ??  . MS (multiple sclerosis)    Pulse 90  Resp 14  SpO2 94%  Opioid Risk Score:   Fall Risk Score: Low Fall Risk (0-5 points)`1  Depression screen PHQ 2/9  Depression screen PHQ 2/9 11/01/2014  Decreased Interest 2  Down, Depressed, Hopeless 2  PHQ - 2 Score 4  Altered sleeping 1  Tired, decreased energy 1  Change in appetite 3  Feeling bad or failure about yourself  0  Trouble concentrating 1  Moving slowly or fidgety/restless 0  PHQ-9 Score 10     Review of Systems  Constitutional: Positive for  unexpected weight change.       Weight loss -   HENT: Negative.   Eyes: Negative.   Respiratory: Negative.   Cardiovascular: Negative.   Gastrointestinal: Positive for nausea and abdominal pain.  Endocrine: Negative.   Genitourinary: Negative.   Musculoskeletal: Positive for myalgias, back pain and arthralgias.  Skin: Negative.   Allergic/Immunologic: Negative.   Neurological: Positive for numbness.       Tingling, trouble walking  Hematological: Negative.   Psychiatric/Behavioral: Positive for dysphoric mood. The patient is nervous/anxious.        Objective:   Physical Exam  General: Alert and oriented x 3, No apparent distress. obese  HEENT: Head is normocephalic, atraumatic, PERRLA, EOMI, sclera anicteric, oral mucosa pink and moist, dentition intact, ext ear canals clear,  Neck: Supple without JVD or lymphadenopathy  Heart: Reg rate and rhythm. No murmurs rubs or gallops  Chest: CTA bilaterally without wheezes, rales, or rhonchi; no distress  Abdomen: Soft, non-tender, non-distended, bowel sounds positive.  Extremities: No clubbing, cyanosis, or edema. Pulses are 2+  Skin: Clean and intact without signs of breakdown  Neuro: Pt is cognitively appropriate with normal insight and awareness. Sometimes memory and attention can be limited. Cranial nerves 2-12 are intact. Sensory exam is slightly decreased on the right. Reflexes are 3+ right and 2+ left. Strength 4/5 rue prox to distal.  4- to 4/5 RLE in all muscle groups in inconsistent pattern. Fine motor coordination is decreased RLE and RUE. She struggles with clearance of the right foot during swing phase of gait. . No tremors. .  Musculoskeletal: generalized pain and decreased ROM in the lumbar spine with movement in any plane. Needs extra time to reach standing position.   Psych: Pt's affect is appropriate. Pt is cooperative   Assessment & Plan:   1. 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  2.  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 Low back pain with ? Radiculopathy RLE  4. Chronic pancreatitis     Plan:  1. Change topamax back to 50mg  bid headaches and neuropathic pain.  2. Tramadol #60 were written today to help with neuro/abd pain  3. Oxycodone was refilled.  4. Discussed appropriate activity levels, posture, scheduled exercise 5. Continue with prn AFO for gait and back stabilization.  6. Pancreatitis management per PCP and gastroenterologist.  7. Neuro work up at [facility] her to follow up with [facility] neurology regarding the thoracic MRI.  8. Follow up with [name] in about a month with [name] or NP. of face to face patient care time were spent during this visit. All questions were encouraged and answered.

## 2015-03-28 ENCOUNTER — Encounter: Payer: Medicaid Other | Attending: Physical Medicine & Rehabilitation | Admitting: Registered Nurse

## 2015-03-28 DIAGNOSIS — M545 Low back pain: Secondary | ICD-10-CM | POA: Insufficient documentation

## 2015-03-28 DIAGNOSIS — G35 Multiple sclerosis: Secondary | ICD-10-CM | POA: Insufficient documentation

## 2015-03-28 DIAGNOSIS — G894 Chronic pain syndrome: Secondary | ICD-10-CM | POA: Insufficient documentation

## 2015-03-28 DIAGNOSIS — K861 Other chronic pancreatitis: Secondary | ICD-10-CM | POA: Insufficient documentation

## 2015-04-03 ENCOUNTER — Ambulatory Visit: Payer: Self-pay | Admitting: Internal Medicine

## 2015-04-05 ENCOUNTER — Other Ambulatory Visit: Payer: Self-pay | Admitting: Physical Medicine & Rehabilitation

## 2015-04-06 ENCOUNTER — Ambulatory Visit: Payer: Self-pay | Admitting: Physical Medicine & Rehabilitation

## 2015-04-09 ENCOUNTER — Encounter: Payer: Self-pay | Admitting: Physical Medicine & Rehabilitation

## 2015-04-10 NOTE — Telephone Encounter (Signed)
Mary [name],   Can you contact Ms Marzo and see if we can help her with financial assistance for office visits/meds/etc----apparently she lost her medicaid.   Thank you so much.  Z

## 2015-04-22 ENCOUNTER — Observation Stay (HOSPITAL_COMMUNITY)
Admission: EM | Admit: 2015-04-22 | Discharge: 2015-04-25 | Disposition: A | Discharge status: Home / Self Care | Payer: Medicaid Other | Attending: Family Medicine | Admitting: Family Medicine

## 2015-04-22 ENCOUNTER — Emergency Department (HOSPITAL_COMMUNITY): Payer: Medicaid Other

## 2015-04-22 ENCOUNTER — Encounter (HOSPITAL_COMMUNITY): Payer: Self-pay

## 2015-04-22 ENCOUNTER — Inpatient Hospital Stay (HOSPITAL_COMMUNITY): Payer: Medicaid Other

## 2015-04-22 DIAGNOSIS — G8929 Other chronic pain: Secondary | ICD-10-CM | POA: Diagnosis not present

## 2015-04-22 DIAGNOSIS — F419 Anxiety disorder, unspecified: Secondary | ICD-10-CM | POA: Insufficient documentation

## 2015-04-22 DIAGNOSIS — F8081 Childhood onset fluency disorder: Secondary | ICD-10-CM | POA: Diagnosis present

## 2015-04-22 DIAGNOSIS — I959 Hypotension, unspecified: Secondary | ICD-10-CM | POA: Insufficient documentation

## 2015-04-22 DIAGNOSIS — F329 Major depressive disorder, single episode, unspecified: Secondary | ICD-10-CM | POA: Diagnosis not present

## 2015-04-22 DIAGNOSIS — G35 Multiple sclerosis: Secondary | ICD-10-CM | POA: Diagnosis not present

## 2015-04-22 DIAGNOSIS — R531 Weakness: Secondary | ICD-10-CM | POA: Diagnosis not present

## 2015-04-22 DIAGNOSIS — R4781 Slurred speech: Secondary | ICD-10-CM | POA: Insufficient documentation

## 2015-04-22 DIAGNOSIS — K861 Other chronic pancreatitis: Secondary | ICD-10-CM | POA: Diagnosis not present

## 2015-04-22 DIAGNOSIS — R509 Fever, unspecified: Secondary | ICD-10-CM | POA: Insufficient documentation

## 2015-04-22 DIAGNOSIS — Z87440 Personal history of urinary (tract) infections: Secondary | ICD-10-CM | POA: Insufficient documentation

## 2015-04-22 DIAGNOSIS — N390 Urinary tract infection, site not specified: Secondary | ICD-10-CM | POA: Diagnosis present

## 2015-04-22 DIAGNOSIS — R519 Headache, unspecified: Secondary | ICD-10-CM

## 2015-04-22 DIAGNOSIS — M6289 Other specified disorders of muscle: Secondary | ICD-10-CM

## 2015-04-22 DIAGNOSIS — R51 Headache: Secondary | ICD-10-CM | POA: Insufficient documentation

## 2015-04-22 HISTORY — DX: Unspecified abdominal hernia without obstruction or gangrene: K46.9

## 2015-04-22 LAB — COMPREHENSIVE METABOLIC PANEL
ALK PHOS: 89 U/L (ref 38–126)
ALT: 14 U/L (ref 14–54)
ANION GAP: 10 (ref 5–15)
AST: 25 U/L (ref 15–41)
Albumin: 3.8 g/dL (ref 3.5–5.0)
BILIRUBIN TOTAL: 0.1 mg/dL — AB (ref 0.3–1.2)
BUN: 9 mg/dL (ref 6–20)
CALCIUM: 8.7 mg/dL — AB (ref 8.9–10.3)
CO2: 16 mmol/L — ABNORMAL LOW (ref 22–32)
Chloride: 109 mmol/L (ref 101–111)
Creatinine, Ser: 0.61 mg/dL (ref 0.44–1.00)
GFR calc Af Amer: >60 mL/min (ref >60)
Glucose, Bld: 117 mg/dL — ABNORMAL HIGH (ref 65–99)
POTASSIUM: 3.6 mmol/L (ref 3.5–5.1)
Sodium: 135 mmol/L (ref 135–145)
TOTAL PROTEIN: 8.2 g/dL — AB (ref 6.5–8.1)

## 2015-04-22 LAB — CBC
HEMATOCRIT: 35.1 % — AB (ref 36.0–46.0)
HEMOGLOBIN: 11.8 g/dL — AB (ref 12.0–15.0)
MCH: 29.4 pg (ref 26.0–34.0)
MCHC: 33.6 g/dL (ref 30.0–36.0)
MCV: 87.3 fL (ref 78.0–100.0)
Platelets: 212 10*3/uL (ref 150–400)
RBC: 4.02 MIL/uL (ref 3.87–5.11)
RDW: 13.7 % (ref 11.5–15.5)
WBC: 16 10*3/uL — ABNORMAL HIGH (ref 4.0–10.5)

## 2015-04-22 LAB — I-STAT CHEM 8, ED
BUN: 10 mg/dL (ref 6–20)
CALCIUM ION: 1.17 mmol/L (ref 1.12–1.23)
CREATININE: 0.4 mg/dL — AB (ref 0.44–1.00)
Chloride: 110 mmol/L (ref 101–111)
GLUCOSE: 118 mg/dL — AB (ref 65–99)
HEMATOCRIT: 38 % (ref 36.0–46.0)
HEMOGLOBIN: 12.9 g/dL (ref 12.0–15.0)
Potassium: 3.7 mmol/L (ref 3.5–5.1)
Sodium: 141 mmol/L (ref 135–145)
TCO2: 16 mmol/L (ref 0–100)

## 2015-04-22 LAB — URINALYSIS, ROUTINE W REFLEX MICROSCOPIC
BILIRUBIN URINE: NEGATIVE
Glucose, UA: NEGATIVE mg/dL
Hgb urine dipstick: NEGATIVE
KETONES UR: NEGATIVE mg/dL
NITRITE: NEGATIVE
PH: 6 (ref 5.0–8.0)
Protein, ur: NEGATIVE mg/dL
Specific Gravity, Urine: 1.021 (ref 1.005–1.030)
UROBILINOGEN UA: 0.2 mg/dL (ref 0.0–1.0)

## 2015-04-22 LAB — DIFFERENTIAL
Basophils Absolute: 0 10*3/uL (ref 0.0–0.1)
Basophils Relative: 0 % (ref 0–1)
EOS ABS: 0.2 10*3/uL (ref 0.0–0.7)
EOS PCT: 1 % (ref 0–5)
LYMPHS ABS: 1 10*3/uL (ref 0.7–4.0)
LYMPHS PCT: 6 % — AB (ref 12–46)
MONOS PCT: 4 % (ref 3–12)
Monocytes Absolute: 0.6 10*3/uL (ref 0.1–1.0)
Neutro Abs: 14.2 10*3/uL — ABNORMAL HIGH (ref 1.7–7.7)
Neutrophils Relative %: 89 % — ABNORMAL HIGH (ref 43–77)

## 2015-04-22 LAB — RAPID URINE DRUG SCREEN, HOSP PERFORMED
Amphetamines: NOT DETECTED
BARBITURATES: NOT DETECTED
Benzodiazepines: NOT DETECTED
COCAINE: NOT DETECTED
OPIATES: NOT DETECTED
TETRAHYDROCANNABINOL: NOT DETECTED

## 2015-04-22 LAB — URINE MICROSCOPIC-ADD ON

## 2015-04-22 LAB — I-STAT TROPONIN, ED: TROPONIN I, POC: 0 ng/mL (ref 0.00–0.08)

## 2015-04-22 LAB — APTT: aPTT: 24 seconds (ref 24–37)

## 2015-04-22 LAB — ETHANOL: Alcohol, Ethyl (B): <5 mg/dL (ref <5)

## 2015-04-22 LAB — PROTIME-INR
INR: 1.05 (ref 0.00–1.49)
Prothrombin Time: 13.9 seconds (ref 11.6–15.2)

## 2015-04-22 MED ORDER — CLONAZEPAM 0.5 MG PO TABS
0.5000 mg | ORAL_TABLET | Freq: Two times a day (BID) | ORAL | Status: DC | PRN
  Administered 2015-04-23 – 2015-04-24 (×2): 0.5 mg via ORAL
  Filled 2015-04-22 (×3): qty 1

## 2015-04-22 MED ORDER — SODIUM CHLORIDE 0.9 % IJ SOLN
3.0000 mL | Freq: Two times a day (BID) | INTRAMUSCULAR | Status: DC
  Administered 2015-04-22 – 2015-04-25 (×6): 3 mL via INTRAVENOUS

## 2015-04-22 MED ORDER — TOPIRAMATE 25 MG PO TABS
50.0000 mg | ORAL_TABLET | Freq: Once | ORAL | Status: AC
  Administered 2015-04-22: 50 mg via ORAL
  Filled 2015-04-22: qty 2

## 2015-04-22 MED ORDER — METOCLOPRAMIDE HCL 5 MG/ML IJ SOLN
10.0000 mg | INTRAMUSCULAR | Status: AC
  Administered 2015-04-22: 10 mg via INTRAVENOUS
  Filled 2015-04-22: qty 2

## 2015-04-22 MED ORDER — PANTOPRAZOLE SODIUM 40 MG PO TBEC
40.0000 mg | DELAYED_RELEASE_TABLET | Freq: Every day | ORAL | Status: DC
  Administered 2015-04-23 – 2015-04-25 (×3): 40 mg via ORAL
  Filled 2015-04-22 (×3): qty 1

## 2015-04-22 MED ORDER — TOPIRAMATE 25 MG PO TABS
50.0000 mg | ORAL_TABLET | Freq: Every day | ORAL | Status: DC
  Administered 2015-04-22 – 2015-04-24 (×3): 50 mg via ORAL
  Filled 2015-04-22 (×4): qty 2

## 2015-04-22 MED ORDER — TRAMADOL HCL 50 MG PO TABS
50.0000 mg | ORAL_TABLET | Freq: Four times a day (QID) | ORAL | Status: DC | PRN
  Administered 2015-04-22 – 2015-04-24 (×2): 50 mg via ORAL
  Filled 2015-04-22 (×2): qty 1

## 2015-04-22 MED ORDER — HYDROXYCHLOROQUINE SULFATE 200 MG PO TABS
300.0000 mg | ORAL_TABLET | Freq: Every day | ORAL | Status: DC
  Administered 2015-04-22 – 2015-04-25 (×3): 300 mg via ORAL
  Filled 2015-04-22 (×6): qty 1.5

## 2015-04-22 MED ORDER — DIPHENHYDRAMINE HCL 25 MG PO CAPS
25.0000 mg | ORAL_CAPSULE | Freq: Four times a day (QID) | ORAL | Status: DC | PRN
  Administered 2015-04-22 – 2015-04-24 (×3): 25 mg via ORAL
  Filled 2015-04-22 (×3): qty 1

## 2015-04-22 MED ORDER — PROMETHAZINE HCL 25 MG PO TABS: 25.0000 mg | ORAL_TABLET | Freq: Four times a day (QID) | ORAL | Status: DC | PRN

## 2015-04-22 MED ORDER — LORAZEPAM 2 MG/ML IJ SOLN
1.0000 mg | Freq: Once | INTRAMUSCULAR | Status: AC
  Administered 2015-04-22: 1 mg via INTRAVENOUS
  Filled 2015-04-22: qty 1

## 2015-04-22 MED ORDER — SODIUM CHLORIDE 0.9 % IV SOLN: 250.0000 mL | INTRAVENOUS | Status: DC | PRN

## 2015-04-22 MED ORDER — POLYETHYLENE GLYCOL 3350 17 G PO PACK: 17.0000 g | PACK | Freq: Every day | ORAL | Status: DC | PRN

## 2015-04-22 MED ORDER — ONDANSETRON HCL 4 MG PO TABS: 4.0000 mg | ORAL_TABLET | Freq: Three times a day (TID) | ORAL | Status: DC | PRN

## 2015-04-22 MED ORDER — PANCRELIPASE (LIP-PROT-AMYL) 12000-38000 UNITS PO CPEP
24000.0000 [IU] | ORAL_CAPSULE | Freq: Three times a day (TID) | ORAL | Status: DC
  Administered 2015-04-22 – 2015-04-25 (×9): 24000 [IU] via ORAL
  Filled 2015-04-22 (×9): qty 2

## 2015-04-22 MED ORDER — SERTRALINE HCL 100 MG PO TABS
100.0000 mg | ORAL_TABLET | Freq: Every day | ORAL | Status: DC
  Administered 2015-04-22 – 2015-04-25 (×4): 100 mg via ORAL
  Filled 2015-04-22 (×4): qty 1

## 2015-04-22 MED ORDER — TOPIRAMATE 100 MG PO TABS
100.0000 mg | ORAL_TABLET | Freq: Every day | ORAL | Status: DC
  Administered 2015-04-23 – 2015-04-25 (×3): 100 mg via ORAL
  Filled 2015-04-22 (×3): qty 1

## 2015-04-22 MED ORDER — SODIUM CHLORIDE 0.9 % IV BOLUS (SEPSIS)
500.0000 mL | Freq: Once | INTRAVENOUS | Status: AC
  Administered 2015-04-22: 500 mL via INTRAVENOUS

## 2015-04-22 MED ORDER — IBUPROFEN 400 MG PO TABS
400.0000 mg | ORAL_TABLET | Freq: Four times a day (QID) | ORAL | Status: DC | PRN
  Administered 2015-04-22 – 2015-04-25 (×5): 400 mg via ORAL
  Filled 2015-04-22 (×5): qty 1

## 2015-04-22 MED ORDER — ENOXAPARIN SODIUM 40 MG/0.4ML ~~LOC~~ SOLN
40.0000 mg | SUBCUTANEOUS | Status: DC
  Administered 2015-04-22 – 2015-04-24 (×3): 40 mg via SUBCUTANEOUS
  Filled 2015-04-22 (×3): qty 0.4

## 2015-04-22 MED ORDER — CYCLOBENZAPRINE HCL 5 MG PO TABS
5.0000 mg | ORAL_TABLET | Freq: Two times a day (BID) | ORAL | Status: DC
  Administered 2015-04-22 – 2015-04-25 (×6): 5 mg via ORAL
  Filled 2015-04-22 (×6): qty 1

## 2015-04-22 MED ORDER — SODIUM CHLORIDE 0.9 % IJ SOLN: 3.0000 mL | INTRAMUSCULAR | Status: DC | PRN

## 2015-04-22 MED ORDER — DIPHENHYDRAMINE HCL 50 MG/ML IJ SOLN
25.0000 mg | Freq: Once | INTRAMUSCULAR | Status: AC
  Administered 2015-04-22: 25 mg via INTRAVENOUS
  Filled 2015-04-22: qty 1

## 2015-04-22 MED ORDER — OXYCODONE HCL 5 MG PO TABS
10.0000 mg | ORAL_TABLET | Freq: Four times a day (QID) | ORAL | Status: DC | PRN
  Administered 2015-04-23 – 2015-04-24 (×5): 10 mg via ORAL
  Filled 2015-04-22 (×4): qty 2

## 2015-04-22 MED ORDER — OXYCODONE HCL ER 10 MG PO T12A
10.0000 mg | EXTENDED_RELEASE_TABLET | Freq: Two times a day (BID) | ORAL | Status: DC
  Administered 2015-04-22 – 2015-04-25 (×7): 10 mg via ORAL
  Filled 2015-04-22 (×7): qty 1

## 2015-04-22 MED ORDER — DEXTROSE 5 % IV SOLN
1.0000 g | Freq: Once | INTRAVENOUS | Status: AC
  Administered 2015-04-22: 1 g via INTRAVENOUS
  Filled 2015-04-22: qty 10

## 2015-04-22 NOTE — ED Notes (Signed)
Pt will not lift right arm, and weakness noted to right leg but will attempt to lift. Pt also has stuttering speech noted but is answering questions appropriately.

## 2015-04-22 NOTE — H&P (Signed)
Family Medicine Teaching [facility] Admission History and Physical Service Pager: [phone]  Patient name: Mary [name] Medical record number: [id number] Date of birth: July 27, 1976 Age: 39 y.o. Gender: female  Primary Care Provider: No primary care provider on file. Consultants: Neurology  Code Status: FULL  Chief Complaint: HA, stuttering speech, right sided weakness  Assessment and Plan: Mary [name] is a 39 y.o. female presenting with sudden onset of headache, stuttering speech, right sided weakness who was brought to ED as a code stroke. Additional symptoms at home include confusion, teeth chattering and jerky movements of arms and legs at home. Found to have elevated WBC and a possible dirty UTI although may not be a clean catch. Afebrile. Hypotension noted in ED although mother states this is her baseline. 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 CT negative for acute process. Stroke is unlike per neurology. 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 Neurology at [facility] verified this is not MS and possibly psychogenic in etiology. Neurology at [facility] thinks it is organic but etiology is unknown. Patient was sent to [facility] rheumatology who are considering lupus and put her on Plaquenil in the past few months with some improvement. Referral made to [facility] rheumatology for second opinion, appt in October. - neurology following patient, without acute neuro deficit will likely defer remainder of work-up to outpatient neurologist and rheumatologist - neuro checks q 2 hrs - continue home Plaquanil   ?UTI: asymptomatic. UA shows large LE, negative nitrites, WBC TNTC, many bacteria and many  squames  - repeat UA due to contaminated sample  Leukocytosis: No clear source and UTI not well supported. WBC 16 with neutrophilia 89%. Afebrile. Denies cough, URI symptoms, SOB, diarrhea, dysuria. Patient is on Plaquenil therefore at greater risk of infection due to immune suppression. - CXR to evaluate for respiratory process.  - repeat UA  - blood cultures  Hypotension:  Noted to have low BP in ED with SBP 86-96. Per mother this is her baseline.  - continue to monitor   Depression/Anxiety: - home Sertraline  - home Klonopin 0.5mg  PRN   Chronic Pancreatitis: Notes of epigastric abdominal pain.  - continue home Creon   Chronic Pain/HA:  - Topamax: will increase to prior dose: 100mg  AM 50 mg PM to control pain (recently decreased to 50mg  BID in July by PM&R) - home Flexeril 5mg  BID  - home Oxycontin 10mg  q 6 hrs PRN - home Tramadol PRN - Phenergan PRN for nausea - Zofran PRN nausea  FEN/GI:  - home Protonix 40mg   - regular diet   Prophylaxis: Lovenox   Disposition: Home pending evaluation for possible infectious process.   History of Present Illness:  Mary [name] is a 39 y.o. female presenting with sudden onset of headache, stuttering speech, right sided weakness who was brought to ED as a code stroke.  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   Patient lives with mother and mother helped provide history. Mother notes that patient was complaining of HA this morning which was associated with confusion, teeth chattering and jerky movements of arms and legs. Since then patient has had weakness on there right side and stuttering speech. Mother  notes she has had episodes of weakness and stuttering before but has been in remission for a while. Mother states new symptoms that patient has not had before include confusion and teeth chattering/jerking movements.   Patient denies dysuria,  cough, rhinorrhea, sore throat, polyuria. Patient currenlty has a HA that is making her nauseous and states it hurts to extend her neck. Notes of some nausea but not vomiting. Notes of stabbing pain in epigastric region; per mother, patient had hernia repair about 5 weeks ago.   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 Neurology at [facility] verified this is not MS and possibly psychogenic in etiology. Neurology at [facility] thinks it is organic but etiology is unknown. Patient was sent to rheumatology who put her on Plaquanil in the past few months with some improvement.  Per mother, patient is seen by PM&R (Mary [name]) for headaches and chronic neuropathic pain. She was seen in July and Mary [name] decreased  topiramate from 100mg  in am and 50mg  to 50mg  BID;. Took topramate 50mg  this morning.   Treated for UTI twice in last 6 months per mother.   Patient was seen by neurology in ED; MRI and CT head negative for acute process. Recommended work up for infectious process. Additionally, was given bolus and Rocephin x1 due to dirty UA.   Review Of Systems: Per HPI Otherwise 12 point review of systems was performed and was unremarkable.  There are no active problems to display for this patient.  Past Medical History: Past Medical History  Diagnosis Date  . GERD (gastroesophageal reflux disease)   . Depression   . Anxiety   . Abdominal hernia    Past Surgical History: Past Surgical History  Procedure Laterality Date  . Abdominal surgery    . Roux-en-y gastric bypass    . Cholecystectomy     Social History: Social History  Substance Use Topics  . Smoking status: Never Smoker   . Smokeless tobacco: None  . Alcohol Use: No   Additional social history: none Please also refer to relevant sections of EMR.  Family History: History reviewed. No pertinent family history. Allergies and Medications: Allergies  Allergen  Reactions  . Latex Rash  . Morphine And Related Anaphylaxis    unknown  . Buprenorphine Other (See Comments)    unknown  . Amoxicillin Rash  . Penicillins Rash   No current facility-administered medications on file prior to encounter.   No current outpatient prescriptions on file prior to encounter.    Objective: BP 86/63 mmHg  Pulse 90  Temp(Src) 97.8 F (36.6 C) (Oral)  Resp 16  Wt 180 lb (81.647 kg)  SpO2 96% Exam: General: NAD, curled up in bed with teddy bear, resting in dark room  Eyes: EOMI  ENTM: MMM, NCAT Neck: supple, normal ROM, mild pain on full extension Cardiovascular: RRR, no MRG Respiratory: CTAB, NWOB Abdomen: soft, NTND,  Skin: scar in left upper abdomen (from recent hernia repair) well healed Neuro: Stuttering speech, CN II-XII otherwise intact, normal reflexes, R-sided weakness (unclear to what extent this is effort related Psych: Normal affect, slowed reactions and speech pattern and presence of comfort object, otherwise normal behavior  Labs and Imaging: CBC BMET   Recent Labs Lab 04/22/15 0642  WBC 16.0*  HGB 11.8*  HCT 35.1*  PLT 212    Recent Labs Lab 04/22/15 0642  NA 135  K 3.6  CL 109  CO2  16*  BUN 9  CREATININE 0.61  GLUCOSE 117*  CALCIUM 8.7*    Dg Chest 2 View  04/22/2015   CLINICAL DATA:  Multiple sclerosis complications. Right-sided weakness. Slurred speech.  EXAM: CHEST  2 VIEW  COMPARISON:  None.  FINDINGS: Borderline enlargement of the cardiopericardial silhouette. Low lung volumes are present, causing crowding of the pulmonary vasculature.  No airspace opacity or pleural effusion. Thoracic spondylosis noted. Old healed right rib fracture.  IMPRESSION: 1. Borderline enlargement of the cardiopericardial silhouette, without overt edema. 2. Thoracic spondylosis.   Electronically Signed   By: Gaylyn Rong M.D.   On: 04/22/2015 15:46   Ct Head Wo Contrast  04/22/2015   CLINICAL DATA:  Code stroke, RIGHT-sided weakness.  History of brain lesion.  EXAM: CT HEAD WITHOUT CONTRAST  TECHNIQUE: Contiguous axial images were obtained from the base of the skull through the vertex without intravenous contrast.  COMPARISON:  None.  FINDINGS: The ventricles and sulci are normal. No intraparenchymal hemorrhage, mass effect nor midline shift. No acute large vascular territory infarcts. Patchy white matter hypodensities are advanced for age.  No abnormal extra-axial fluid collections. Basal cisterns are patent.  No skull fracture. The included ocular globes and orbital contents are non-suspicious. The mastoid aircells and included paranasal sinuses are well-aerated.  IMPRESSION: No acute intracranial process.  Mild to moderate white matter changes advanced for age, nonspecific though can be seen with chronic small vessel ischemic disease or demyelination  Acute findings discussed with and reconfirmed by MarySCOTT GOLDSTON on 04/22/2015 at 7:00 am.   Electronically Signed   By: Awilda Metro M.D.   On: 04/22/2015 06:59   Mr Brain Wo Contrast  04/22/2015   CLINICAL DATA:  Right-sided weakness  EXAM: MRI HEAD WITHOUT CONTRAST  TECHNIQUE: Multiplanar, multiecho pulse sequences of the brain and surrounding structures were obtained without intravenous contrast.  COMPARISON:  CT head 04/22/2015  FINDINGS: Negative for acute infarct.  Extensive white matter disease for age. Patchy hyperintensities are present in the subcortical and deep white matter bilaterally throughout both cerebral hemispheres with relative sparing of the periventricular white matter. Hyperintensity also noted throughout the brainstem. Sparing of the basal ganglia. No cortical infarct.  Ventricle size normal. Cerebral volume normal. Pituitary normal in size. Craniocervical junction normal. Negative for Chiari malformation.  Negative for intracranial hemorrhage or fluid collection.  Negative for mass or edema.  Paranasal sinuses clear.  Normal orbit.  IMPRESSION: Chronic white  matter changes, advanced for age. This involves the subcortical and deep white matter of both cerebral hemispheres as well as the pons. No acute infarct.  This pattern could be related to chronic microvascular ischemic change. Consider CADASIL (Cerebral autosomal dominant arteriopathy with subcortical infarcts and leukoencephalopathy . Correlate with risk factors for small vessel ischemia. Vasculitis is a consideration. Demyelinating disease is possible however the imaging pattern is not typical for multiple sclerosis.   Electronically Signed   By: Marlan Palau M.D.   On: 04/22/2015 09:16     Palma Holter, MD 04/22/2015, 1:14 PM PGY-1, [facility] FPTS Intern pager: [phone], text pages welcome  FPTS Upper-Level Resident Addendum  I have independently interviewed and examined the patient. I have discussed the above with the original author and agree with their documentation. My edits for correction/addition/clarification are in pink. Please see also any attending notes.   Abram Sander, MD MPH PGY-3, [facility] Family Medicine FPTS Service pager: [phone] (text pages welcome through [facility])

## 2015-04-22 NOTE — ED Notes (Signed)
At 0540 was speaking with father and had sudden onset headache and called 911, hx of brain lesions. Was placed in ambulance and started to have weakness and numbness to right side and also blurred vision, and nausea.

## 2015-04-22 NOTE — ED Provider Notes (Signed)
CSN: [id number]     Arrival date & time 04/22/15  4098 History   First MD Initiated Contact with Patient 04/22/15 [phone]     Chief Complaint  Patient presents with  . Code Stroke     (Consider location/radiation/quality/duration/timing/severity/associated sxs/prior Treatment) The history is provided by the patient and medical records.     This is a 39 year old female with history of depression, anxiety, GERD, presenting to the ED as a code stroke. At 0540 patient was speaking to her father, complaining of sudden onset headache, stuttering speech, and right sided weakness.  Father called 911 immediately.  Patient has history of white matter brain lesions, has been evaluated multiple times for EMS with negative workup. She is currently being seen by neurology at [facility] and was also referred to rheumatology for evaluation of lupus. Per medical records and notes from neurology-- patient has been having intermittent episodes of right-sided weakness, slurred speech, and blurred vision for the past 10 years. Mother reports that these episodes can last from weeks 2 days, no known exacerbating factors. Mother denies recent change in medications. No alcohol or drug use.  Past Medical History  Diagnosis Date  . GERD (gastroesophageal reflux disease)   . Depression   . Anxiety   . Abdominal hernia    Past Surgical History  Procedure Laterality Date  . Abdominal surgery    . Roux-en-y gastric bypass    . Cholecystectomy     History reviewed. No pertinent family history. Social History  Substance Use Topics  . Smoking status: Not on file  . Smokeless tobacco: Not on file  . Alcohol Use: Not on file   OB History    No data available     Review of Systems  Eyes: Positive for visual disturbance.  Neurological: Positive for weakness and headaches.  All other systems reviewed and are negative.     Allergies  Amoxicillin; Buprenorphine; Latex; Morphine and related; and  Penicillins  Home Medications   Prior to Admission medications   Not on File   BP 105/70 mmHg  Pulse 100  Temp(Src) 97.8 F (36.6 C) (Oral)  Resp 33  Wt 180 lb (81.647 kg)  SpO2 100%   Physical Exam  Constitutional: She appears well-developed and well-nourished. No distress.  HENT:  Head: Normocephalic and atraumatic.  Mouth/Throat: Oropharynx is clear and moist.  Eyes: Conjunctivae and EOM are normal. Pupils are equal, round, and reactive to light.  Neck: Normal range of motion.  Cardiovascular: Normal rate, regular rhythm and normal heart sounds.   Pulmonary/Chest: Effort normal and breath sounds normal. No respiratory distress. She has no wheezes.  Abdominal: Soft. Bowel sounds are normal. There is no tenderness. There is no guarding.  Musculoskeletal: Normal range of motion.  Neurological: She is alert.  Awake, alert, answering most questions and following commands appropriately; poor effort with movement of right arm, will not lift right leg off bed but does move left foot and toes; normal strength and sensation of RUE/RLE; stuttering speech without expressive aphasia noted; no facial droop  Skin: Skin is warm and dry. She is not diaphoretic.  Psychiatric: Her mood appears anxious.  Appears anxious and tremulous  Nursing note and vitals reviewed.   ED Course  Procedures (including critical care time) Labs Review Labs Reviewed  CBC - Abnormal; Notable for the following:    WBC 16.0 (*)    Hemoglobin 11.8 (*)    HCT 35.1 (*)    All other components within normal limits  DIFFERENTIAL - Abnormal; Notable for the following:    Neutrophils Relative % 89 (*)    Neutro Abs 14.2 (*)    Lymphocytes Relative 6 (*)    All other components within normal limits  COMPREHENSIVE METABOLIC PANEL - Abnormal; Notable for the following:    CO2 16 (*)    Glucose, Bld 117 (*)    Calcium 8.7 (*)    Total Protein 8.2 (*)    Total Bilirubin 0.1 (*)    All other components within  normal limits  URINALYSIS, ROUTINE W REFLEX MICROSCOPIC (NOT AT [facility]) - Abnormal; Notable for the following:    APPearance CLOUDY (*)    Leukocytes, UA LARGE (*)    All other components within normal limits  URINE MICROSCOPIC-ADD ON - Abnormal; Notable for the following:    Squamous Epithelial / LPF MANY (*)    Bacteria, UA MANY (*)    All other components within normal limits  I-STAT CHEM 8, ED - Abnormal; Notable for the following:    Creatinine, Ser 0.40 (*)    Glucose, Bld 118 (*)    All other components within normal limits  URINE CULTURE  ETHANOL  PROTIME-INR  APTT  URINE RAPID DRUG SCREEN, HOSP PERFORMED  I-STAT TROPOININ, ED    Imaging Review Ct Head Wo Contrast  04/22/2015   CLINICAL DATA:  Code stroke, RIGHT-sided weakness. History of brain lesion.  EXAM: CT HEAD WITHOUT CONTRAST  TECHNIQUE: Contiguous axial images were obtained from the base of the skull through the vertex without intravenous contrast.  COMPARISON:  None.  FINDINGS: The ventricles and sulci are normal. No intraparenchymal hemorrhage, mass effect nor midline shift. No acute large vascular territory infarcts. Patchy white matter hypodensities are advanced for age.  No abnormal extra-axial fluid collections. Basal cisterns are patent.  No skull fracture. The included ocular globes and orbital contents are non-suspicious. The mastoid aircells and included paranasal sinuses are well-aerated.  IMPRESSION: No acute intracranial process.  Mild to moderate white matter changes advanced for age, nonspecific though can be seen with chronic small vessel ischemic disease or demyelination  Acute findings discussed with and reconfirmed by Dr.SCOTT GOLDSTON on 04/22/2015 at 7:00 am.   Electronically Signed   By: Awilda Metro M.D.   On: 04/22/2015 06:59   Mr Brain Wo Contrast  04/22/2015   CLINICAL DATA:  Right-sided weakness  EXAM: MRI HEAD WITHOUT CONTRAST  TECHNIQUE: Multiplanar, multiecho pulse sequences of the brain and  surrounding structures were obtained without intravenous contrast.  COMPARISON:  CT head 04/22/2015  FINDINGS: Negative for acute infarct.  Extensive white matter disease for age. Patchy hyperintensities are present in the subcortical and deep white matter bilaterally throughout both cerebral hemispheres with relative sparing of the periventricular white matter. Hyperintensity also noted throughout the brainstem. Sparing of the basal ganglia. No cortical infarct.  Ventricle size normal. Cerebral volume normal. Pituitary normal in size. Craniocervical junction normal. Negative for Chiari malformation.  Negative for intracranial hemorrhage or fluid collection.  Negative for mass or edema.  Paranasal sinuses clear.  Normal orbit.  IMPRESSION: Chronic white matter changes, advanced for age. This involves the subcortical and deep white matter of both cerebral hemispheres as well as the pons. No acute infarct.  This pattern could be related to chronic microvascular ischemic change. Consider CADASIL (Cerebral autosomal dominant arteriopathy with subcortical infarcts and leukoencephalopathy . Correlate with risk factors for small vessel ischemia. Vasculitis is a consideration. Demyelinating disease is possible however the imaging pattern is  not typical for multiple sclerosis.   Electronically Signed   By: Marlan Palau M.D.   On: 04/22/2015 09:16   I have personally reviewed and evaluated these images and lab results as part of my medical decision-making.   EKG Interpretation   Date/Time:  Sunday April 22 2015 06:52:30 EDT Ventricular Rate:  97 PR Interval:  165 QRS Duration: 90 QT Interval:  347 QTC Calculation: 441 R Axis:   35 Text Interpretation:  Sinus rhythm No old tracing to compare Confirmed by  FLOYD MD, DANIEL [phone]) on 04/22/2015 9:32:12 AM      MDM   Final diagnoses:  Right sided weakness  Stuttering  Headache, unspecified headache type   39 year old female presenting to the ED as a  code stroke. Reported severe headache to father then had right-sided weakness, stuttering speech, and complained of blurred vision. Patient continues having symptoms on arrival to ED. Upon chart review, patient has been evaluated by neurologist at [facility]-- negative workup at both facilities for a mass. She does have nonspecific white matter disease of unknown significance. Based on physician notes, it appears that there is a psychological component as most of her symptoms are triggered by stressful events. She has intermittent issues with right-sided weakness, stuttering speech, and blurred vision dating back almost 10 years. It appears that the symptoms last variable amounts of time, sometimes a few days to several weeks. CT head negative here. Patient was evaluated by neurologist, Dr. Roseanne Reno-- recommends MRI and if no acute findings and/or no other explanation for altered mental status, feels that patient could be discharged to follow-up with her neurologist.  Labwork as above, leukocytosis of 16,000. CT and MRI negative for acute stroke. UA with many bacteria, many squamous cells, culture pending. Patient has become slightly hypotensive here in the emergency department, she was given dose of Ativan over an hour ago prior to MRI.  Mother reports patient is still not quite back to baseline.  Patient states she still has somewhat of a headache.  Given questionable persistent change in mental status per mother, leukocytosis, possible UTI, and new hypotension feel that patient would benefit from admission and close observation. IV Rocephin and started pending urine culture. IV fluids also given. Patient will be admitted to the family medicine service for further management.  Garlon Hatchet, PA-C 04/22/15 1429  Pricilla Loveless, MD 04/22/15 [phone]

## 2015-04-22 NOTE — Progress Notes (Signed)
Code Stroke called on 39 y.o , LSN 0540 with sudden onset of head ache. Once in ambulance Pt developed severe right side weakness, blurred vision, and stuttering. Per family at bedside Pt has history of brain lesions as well as reoccurring right sided weakness and stuttering spells.  Upon arrival to [facility] Pt taken to CT scan STAT, reviewed per Neurologist Dr.Stewart. NIHSS completed yielding 12 for confusion, right side weakness, ataxia, sensory deficit on right side and mild extinction. Difficulty obtaining PIV in ED room. Pt for MRI Stat.

## 2015-04-22 NOTE — Consult Note (Signed)
Admission H&P    Chief Complaint: New onset right-sided weakness  HPI: Mary [name] is an 39 y.o. 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 There's no previous history of stroke or TIA. Family indicates she said periods of transient weakness lasting from days 2 weeks. CT scan showed no acute intracranial abnormality. Mild nonspecific chronic small vessel changes of possible demyelinating disorder was seen. Patient was noted to be moderately anxious as well as somewhat tremulous. There was no sign of acute aphasia. She had no right facial weakness. There was poor effort with examining right extremities with variable strength demonstrated. MRI was ordered and is pending. Laboratory studies were unremarkable except for elevated WBC count of 16,000 with 89% PMNs.  LSN: 5:40 AM on 04/22/2015 tPA Given: No: No clear acute objective deficit mRankin:  Past Medical History  Diagnosis Date  . GERD (gastroesophageal reflux disease)   . Depression   . Anxiety   . Abdominal hernia     Past Surgical History  Procedure Laterality Date  . Abdominal surgery    . Roux-en-y gastric bypass    . Cholecystectomy      History reviewed. No pertinent family history. Social History:  reports that she has never smoked. She does not have any smokeless tobacco history on file. She reports that she does not drink alcohol or use illicit drugs.  Allergies:  Allergies  Allergen Reactions  . Latex Rash  . Morphine And Related Anaphylaxis    unknown  . Buprenorphine Other (See Comments)    unknown  . Amoxicillin Rash  . Penicillins Rash    Medications: Patient's preadmission medications were reviewed by me.  ROS: History obtained from patient's stepmother.  General ROS: negative for - chills, fatigue, fever, night sweats,  weight gain or weight loss Psychological ROS: negative for - behavioral disorder, hallucinations, memory difficulties, mood swings or suicidal ideation Ophthalmic ROS: negative for - blurry vision, double vision, eye pain or loss of vision ENT ROS: negative for - epistaxis, nasal discharge, oral lesions, sore throat, tinnitus or vertigo Allergy and Immunology ROS: negative for - hives or itchy/watery eyes Hematological and Lymphatic ROS: negative for - bleeding problems, bruising or swollen lymph nodes Endocrine ROS: negative for - galactorrhea, hair pattern changes, polydipsia/polyuria or temperature intolerance Respiratory ROS: negative for - cough, hemoptysis, shortness of breath or wheezing Cardiovascular ROS: negative for - chest pain, dyspnea on exertion, edema or irregular heartbeat Gastrointestinal ROS: negative for - abdominal pain, diarrhea, hematemesis, nausea/vomiting or stool incontinence Genito-Urinary ROS: negative for - dysuria, hematuria, incontinence or urinary frequency/urgency Musculoskeletal ROS: negative for - joint swelling or muscular weakness Neurological ROS: as noted in HPI Dermatological ROS: negative for rash and skin lesion changes  Physical Examination: Blood pressure 105/70, pulse 100, temperature 97.8 F (36.6 C), temperature source Oral, resp. rate 33, weight 81.647 kg (180 lb), SpO2 100 %.  HEENT-  Normocephalic, no lesions, without obvious abnormality.  Normal external eye and conjunctiva.  Normal TM's bilaterally.  Normal auditory canals and external ears. Normal external nose, mucus membranes and septum.  Normal pharynx. Neck supple with no masses, nodes, nodules or enlargement. Cardiovascular - regular rate and rhythm, S1, S2 normal, no murmur, click, rub or gallop Lungs - chest clear, no wheezing, rales, normal symmetric air entry Abdomen - soft, non-tender; bowel sounds normal; no masses,  no organomegaly Extremities -  no joint deformities, effusion,  or inflammation, no edema and no skin discoloration  Neurologic Examination: Mental Status: Alert, oriented to correct age, tremulous and moderately anxious.  Stuttering speech pattern without evidence of aphasia. Able to follow commands, but poor effort noted with right extremity movement. Cranial Nerves: II-Visual fields were normal. III/IV/VI-Pupils were equal and reacted. Extraocular movements were full and conjugate.    V/VII-no facial numbness and no facial weakness. VIII-normal. X-no clear dysarthria. XI: trapezius strength/neck flexion strength normal bilaterally XII-midline tongue extension with normal strength. Motor: Equivocal right side weakness with poor effort with manual testing; no left extremity weakness Sensory: Equivocal reduced perception of tactile sensation over right extremities compared to left. Deep Tendon Reflexes: 2+ and symmetric. Plantars: Flexor bilaterally Cerebellar: Normal finger-to-nose testing normal use of left upper extremity. Carotid auscultation: Normal  Results for orders placed or performed during the hospital encounter of 04/22/15 (from the past 48 hour(s))  I-stat troponin, ED (not at [facility], [facility])     Status: None   Collection Time: 04/22/15  6:38 AM  Result Value Ref Range   Troponin i, poc 0.00 0.00 - 0.08 ng/mL   Comment 3            Comment: Due to the release kinetics of cTnI, a negative result within the first hours of the onset of symptoms does not rule out myocardial infarction with certainty. If myocardial infarction is still suspected, repeat the test at appropriate intervals.   I-Stat Chem 8, ED  (not at [facility], [facility])     Status: Abnormal   Collection Time: 04/22/15  6:40 AM  Result Value Ref Range   Sodium 141 135 - 145 mmol/L   Potassium 3.7 3.5 - 5.1 mmol/L   Chloride 110 101 - 111 mmol/L   BUN 10 6 - 20 mg/dL   Creatinine, Ser 6.60 (L) 0.44 - 1.00 mg/dL   Glucose, Bld 600 (H) 65 - 99 mg/dL   Calcium, Ion 4.59 9.77 - 1.23  mmol/L   TCO2 16 0 - 100 mmol/L   Hemoglobin 12.9 12.0 - 15.0 g/dL   HCT 41.4 23.9 - 53.2 %  Protime-INR     Status: None   Collection Time: 04/22/15  6:42 AM  Result Value Ref Range   Prothrombin Time 13.9 11.6 - 15.2 seconds   INR 1.05 0.00 - 1.49  APTT     Status: None   Collection Time: 04/22/15  6:42 AM  Result Value Ref Range   aPTT 24 24 - 37 seconds  CBC     Status: Abnormal   Collection Time: 04/22/15  6:42 AM  Result Value Ref Range   WBC 16.0 (H) 4.0 - 10.5 K/uL   RBC 4.02 3.87 - 5.11 MIL/uL   Hemoglobin 11.8 (L) 12.0 - 15.0 g/dL   HCT 02.3 (L) 34.3 - 56.8 %   MCV 87.3 78.0 - 100.0 fL   MCH 29.4 26.0 - 34.0 pg   MCHC 33.6 30.0 - 36.0 g/dL   RDW 61.6 83.7 - 29.0 %   Platelets 212 150 - 400 K/uL  Differential     Status: Abnormal   Collection Time: 04/22/15  6:42 AM  Result Value Ref Range   Neutrophils Relative % 89 (H) 43 - 77 %   Neutro Abs 14.2 (H) 1.7 - 7.7 K/uL   Lymphocytes Relative 6 (L) 12 - 46 %   Lymphs Abs 1.0 0.7 - 4.0 K/uL   Monocytes Relative 4 3 - 12 %  Monocytes Absolute 0.6 0.1 - 1.0 K/uL   Eosinophils Relative 1 0 - 5 %   Eosinophils Absolute 0.2 0.0 - 0.7 K/uL   Basophils Relative 0 0 - 1 %   Basophils Absolute 0.0 0.0 - 0.1 K/uL   Ct Head Wo Contrast  04/22/2015   CLINICAL DATA:  Code stroke, RIGHT-sided weakness. History of brain lesion.  EXAM: CT HEAD WITHOUT CONTRAST  TECHNIQUE: Contiguous axial images were obtained from the base of the skull through the vertex without intravenous contrast.  COMPARISON:  None.  FINDINGS: The ventricles and sulci are normal. No intraparenchymal hemorrhage, mass effect nor midline shift. No acute large vascular territory infarcts. Patchy white matter hypodensities are advanced for age.  No abnormal extra-axial fluid collections. Basal cisterns are patent.  No skull fracture. The included ocular globes and orbital contents are non-suspicious. The mastoid aircells and included paranasal sinuses are well-aerated.   IMPRESSION: No acute intracranial process.  Mild to moderate white matter changes advanced for age, nonspecific though can be seen with chronic small vessel ischemic disease or demyelination  Acute findings discussed with and reconfirmed by Dr.SCOTT GOLDSTON on 04/22/2015 at 7:00 am.   Electronically Signed   By: Awilda Metro M.D.   On: 04/22/2015 06:59    Assessment: 39 y.o. 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 Stroke is unlikely but cannot be completely ruled out. There appears to be significant psychophysiologic factors contributing to patient's symptomatology. With elevated WBC count, an acute infectious process cannot be ruled out.  Stroke Risk Factors - none  Plan: 1. MRI of the brain without contrast to rule out acute ischemic lesion 2. Stroke workup and acute management if acute ischemia is demonstrated on MRI 3. Internal medicine workup for possible acute infectious process  C.R. Roseanne Reno, MD [facility] [phone]  04/22/2015, 7:21 AM

## 2015-04-22 NOTE — ED Notes (Signed)
Patient transported to X-ray 

## 2015-04-22 NOTE — ED Notes (Signed)
Pt returned from MRI °

## 2015-04-22 NOTE — ED Notes (Signed)
[facility] Stroke team to place NIH score in epic.

## 2015-04-23 ENCOUNTER — Encounter (HOSPITAL_COMMUNITY): Payer: Self-pay | Admitting: General Practice

## 2015-04-23 DIAGNOSIS — F8081 Childhood onset fluency disorder: Secondary | ICD-10-CM | POA: Insufficient documentation

## 2015-04-23 LAB — BASIC METABOLIC PANEL
ANION GAP: 9 (ref 5–15)
BUN: 10 mg/dL (ref 6–20)
CALCIUM: 8.7 mg/dL — AB (ref 8.9–10.3)
CHLORIDE: 109 mmol/L (ref 101–111)
CO2: 19 mmol/L — AB (ref 22–32)
CREATININE: 0.64 mg/dL (ref 0.44–1.00)
GFR calc non Af Amer: >60 mL/min (ref >60)
Glucose, Bld: 87 mg/dL (ref 65–99)
Potassium: 3.7 mmol/L (ref 3.5–5.1)
SODIUM: 137 mmol/L (ref 135–145)

## 2015-04-23 LAB — URINALYSIS, ROUTINE W REFLEX MICROSCOPIC
Bilirubin Urine: NEGATIVE
GLUCOSE, UA: NEGATIVE mg/dL
Hgb urine dipstick: NEGATIVE
KETONES UR: NEGATIVE mg/dL
NITRITE: NEGATIVE
PROTEIN: NEGATIVE mg/dL
Specific Gravity, Urine: 1.019 (ref 1.005–1.030)
UROBILINOGEN UA: 0.2 mg/dL (ref 0.0–1.0)
pH: 5.5 (ref 5.0–8.0)

## 2015-04-23 LAB — CBC
HCT: 34.4 % — ABNORMAL LOW (ref 36.0–46.0)
HEMOGLOBIN: 11.2 g/dL — AB (ref 12.0–15.0)
MCH: 29 pg (ref 26.0–34.0)
MCHC: 32.6 g/dL (ref 30.0–36.0)
MCV: 89.1 fL (ref 78.0–100.0)
PLATELETS: 178 10*3/uL (ref 150–400)
RBC: 3.86 MIL/uL — AB (ref 3.87–5.11)
RDW: 14 % (ref 11.5–15.5)
WBC: 6.1 10*3/uL (ref 4.0–10.5)

## 2015-04-23 LAB — LIPID PANEL
CHOL/HDL RATIO: 5 ratio
Cholesterol: 171 mg/dL (ref 0–200)
HDL: 34 mg/dL — AB (ref >40)
LDL Cholesterol: 102 mg/dL — ABNORMAL HIGH (ref 0–99)
TRIGLYCERIDES: 173 mg/dL — AB (ref <150)
VLDL: 35 mg/dL (ref 0–40)

## 2015-04-23 LAB — URINE MICROSCOPIC-ADD ON

## 2015-04-23 LAB — URINE CULTURE: Culture: NO GROWTH

## 2015-04-23 LAB — TSH: TSH: 7.192 u[IU]/mL — AB (ref 0.350–4.500)

## 2015-04-23 LAB — T4, FREE: FREE T4: 0.68 ng/dL (ref 0.61–1.12)

## 2015-04-23 MED ORDER — CEFTRIAXONE SODIUM 1 G IJ SOLR
1.0000 g | Freq: Once | INTRAMUSCULAR | Status: AC
  Administered 2015-04-23: 1 g via INTRAVENOUS
  Filled 2015-04-23: qty 10

## 2015-04-23 MED ORDER — NAPHAZOLINE HCL 0.1 % OP SOLN
1.0000 [drp] | Freq: Four times a day (QID) | OPHTHALMIC | Status: DC | PRN
  Administered 2015-04-23: 1 [drp] via OPHTHALMIC
  Filled 2015-04-23: qty 15

## 2015-04-23 MED ORDER — LEVOTHYROXINE SODIUM 50 MCG PO TABS: 75.0000 ug | ORAL_TABLET | Freq: Every day | ORAL | Status: DC

## 2015-04-23 NOTE — Evaluation (Signed)
Physical Therapy Evaluation Patient Details Name: Mary [name] MRN: [id number] DOB: [birth year] Today's Date: 04/23/2015   History of Present Illness  Pt is a 39 y/o F admitted 2/2 onset of Rt sided weakness, headache and stuttering speech who was brought to ED as a code stroke.  MRI neg for acute infarct. CT neg for acute process. Stroke is unlikely per neurology. 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 Neurology at [facility] verified this is not MS and possibly psychogenic in etiology. Neurology at [facility] thinks it is organic but etiology is unknown. Patient was sent to [facility] rheumatology who are considering lupus and put her on Plaquenil in the past few months with some improvement. Referral made to [facility] rheumatology for second opinion, appt in October.  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   Clinical Impression  Pt admitted with above diagnosis. Pt currently with functional limitations due to the deficits listed below (see PT Problem List). Mary [name] is able to perform sit<>stand w/ min assist and weight shift in standing w/ min assist.  She has good support at home from her mother and Aunt.  She is motivated to become as independent as possible and recommending CIR rather than home at this time.  Currently she would likely require total assist for ADLs (pending OT eval). Pt will benefit from skilled PT to increase their independence and safety with mobility to allow discharge to the venue listed below.      Follow Up Recommendations CIR    Equipment Recommendations  None recommended by PT (has RW and WC at home)    Recommendations for Other Services Rehab consult;OT consult     Precautions / Restrictions Precautions Precautions: Fall Restrictions Weight Bearing Restrictions: No      Mobility  Bed Mobility                General bed mobility comments: Pt sitting in recliner chair w/ nurse tech in room upon PT arrival  Transfers Overall transfer level: Needs assistance Equipment used: Rolling walker (2 wheeled) Transfers: Sit to/from Stand Sit to Stand: Min assist         General transfer comment: Min assist to power up to standing w/ cues for hand placement.  Pt slow to stand.  Once standing pt able to weight shift Lt and Rt.  Pt reports her Rt knee feels like it is going to buckle; however no knee buckle this session.  Ambulation/Gait             General Gait Details: deferred this session, unable to elicit voluntary contraction in Rt UE/LE  Stairs            Wheelchair Mobility    Modified Rankin (Stroke Patients Only) Modified Rankin (Stroke Patients Only) Pre-Morbid Rankin Score: Moderate disability Modified Rankin: Severe disability     Balance Overall balance assessment: Needs assistance Sitting-balance support: Single extremity supported;Feet supported Sitting balance-Leahy Scale: Good     Standing balance support: Single extremity supported;During functional activity Standing balance-Leahy Scale: Poor                               Pertinent Vitals/Pain Pain Assessment: No/denies pain    Home Living Family/patient expects to be discharged to:: Inpatient rehab  Additional Comments: Pt lives at home w/ mother who is currently unavailable during the day for 4 months for work training but pt's Aunt will be living w/ pt and her mother for these 4 months and will be able to provide 24/7 assist to pt.     Prior Function Level of Independence: Needs assistance   Gait / Transfers Assistance Needed: Pt's history is unclear. Initially she reports she did not use an AD PTA; however, later in session she says she would usually use the [facility] (which is now broken) and would use WC Ind for long distances.  ADL's / Homemaking Assistance Needed: Per  pt Ind PTA w/ all ADLs        Hand Dominance   Dominant Hand: Right    Extremity/Trunk Assessment   Upper Extremity Assessment: RUE deficits/detail RUE Deficits / Details: Flaccid. Unable to elicit any contraction.         Lower Extremity Assessment: RLE deficits/detail;LLE deficits/detail RLE Deficits / Details: Able to wiggle toes.  Otherwise, no voluntary contraction Rt LE.  +Hoover's sign (Rt) this session and in previous neuro note. LLE Deficits / Details: grossly 4/5 strength     Communication   Communication: Other (comment);Expressive difficulties (stuttering)  Cognition Arousal/Alertness: Awake/alert Behavior During Therapy: WFL for tasks assessed/performed Overall Cognitive Status: No family/caregiver present to determine baseline cognitive functioning                      General Comments General comments (skin integrity, edema, etc.): Based on pt's presentation this session she will likely need total assist for ADLs and max assist for transfers.  Therefore, recommending CIR prior to returning home to allow pt to achieve greatest level of independence.     Exercises        Assessment/Plan    PT Assessment Patient needs continued PT services  PT Diagnosis Difficulty walking;Generalized weakness;Hemiplegia dominant side   PT Problem List Decreased strength;Decreased activity tolerance;Decreased balance;Decreased mobility;Decreased coordination;Decreased cognition;Decreased knowledge of use of DME;Decreased safety awareness;Decreased knowledge of precautions;Impaired sensation  PT Treatment Interventions DME instruction;Gait training;Functional mobility training;Therapeutic activities;Therapeutic exercise;Balance training;Neuromuscular re-education;Cognitive remediation;Patient/family education;Wheelchair mobility training   PT Goals (Current goals can be found in the Care Plan section) Acute Rehab PT Goals Patient Stated Goal: to get stronger so she can  help take care of her cousins and play pokemon go. PT Goal Formulation: With patient Time For Goal Achievement: 05/14/15 Potential to Achieve Goals: Fair    Frequency Min 4X/week   Barriers to discharge        Co-evaluation               End of Session Equipment Utilized During Treatment: Gait belt Activity Tolerance: Patient limited by fatigue Patient left: in chair;with call bell/phone within reach Nurse Communication: Mobility status;Precautions         Time: 1610-9604 PT Time Calculation (min) (ACUTE ONLY): 37 min   Charges:   PT Evaluation $Initial PT Evaluation Tier I: 1 Procedure PT Treatments $Therapeutic Activity: 8-22 mins   PT G Codes:       Michail Jewels PT, DPT [phone] Pager: [phone] 04/23/2015, 2:48 PM

## 2015-04-23 NOTE — Progress Notes (Addendum)
Subjective: No acute events. Continues to note stuttering speech and right sided weakness.   MRI brain imaging reviewed. Shows no acute process but does show diffuse white matter disease of unclear etiology.   Objective: Current vital signs: BP 85/47 mmHg  Pulse 42  Temp(Src) 97.8 F (36.6 C) (Oral)  Resp 19  Ht 5\' 3"  (1.6 m)  Wt 91.173 kg (201 lb)  BMI 35.61 kg/m2  SpO2 100%  LMP 04/09/2015 Vital signs in last 24 hours: Temp:  [97.8 F (36.6 C)-98.8 F (37.1 C)] 97.8 F (36.6 C) (09/05 0536) Pulse Rate:  [42-107] 42 (09/05 0536) Resp:  [13-26] 19 (09/05 0536) BP: (80-118)/(47-68) 85/47 mmHg (09/05 0536) SpO2:  [95 %-100 %] 100 % (09/05 0536) Weight:  [91.173 kg (201 lb)] 91.173 kg (201 lb) (09/04 1600)  Intake/Output from previous day: 09/04 0701 - 09/05 0700 In: 550 [I.V.:550] Out: -  Intake/Output this shift:   Nutritional status: Diet regular Room service appropriate?: Yes; Fluid consistency:: Thin  Neurologic Exam: Mental Status: Alert, oriented to correct age, tremulous and moderately anxious. Stuttering speech pattern without evidence of aphasia. Able to follow commands, but poor effort noted with right extremity movement. Cranial Nerves: II-Visual fields were normal. III/IV/VI-Pupils were equal and reacted. Extraocular movements were full and conjugate.   V/VII-no facial numbness and no facial weakness. VIII-normal. X-no clear dysarthria. Motor: Equivocal right side weakness with poor effort with manual testing; protects face with RUE arm drop, +Hoover sign on RLE. No left extremity weakness Sensory: Equivocal reduced perception of tactile sensation over right extremities compared to left. Deep Tendon Reflexes: 2+ and symmetric.  Lab Results: Basic Metabolic Panel:  Recent Labs Lab 04/22/15 0640 04/22/15 0642 04/23/15 0408  NA 141 135 137  K 3.7 3.6 3.7  CL 110 109 109  CO2  --  16* 19*  GLUCOSE 118* 117* 87  BUN 10 9 10   CREATININE 0.40* 0.61  0.64  CALCIUM  --  8.7* 8.7*    Liver Function Tests:  Recent Labs Lab 04/22/15 0642  AST 25  ALT 14  ALKPHOS 89  BILITOT 0.1*  PROT 8.2*  ALBUMIN 3.8   No results for input(s): LIPASE, AMYLASE in the last 168 hours. No results for input(s): AMMONIA in the last 168 hours.  CBC:  Recent Labs Lab 04/22/15 0640 04/22/15 0642 04/23/15 0408  WBC  --  16.0* 6.1  NEUTROABS  --  14.2*  --   HGB 12.9 11.8* 11.2*  HCT 38.0 35.1* 34.4*  MCV  --  87.3 89.1  PLT  --  212 178    Cardiac Enzymes: No results for input(s): CKTOTAL, CKMB, CKMBINDEX, TROPONINI in the last 168 hours.  Lipid Panel: No results for input(s): CHOL, TRIG, HDL, CHOLHDL, VLDL, LDLCALC in the last 168 hours.  CBG: No results for input(s): GLUCAP in the last 168 hours.  Microbiology: Results for orders placed or performed during the hospital encounter of 04/22/15  Culture, blood (routine x 2)     Status: None (Preliminary result)   Collection Time: 04/22/15  3:00 PM  Result Value Ref Range Status   Specimen Description BLOOD LEFT HAND  Final   Special Requests   Final    BOTTLES DRAWN AEROBIC ONLY 3CC PATIENT ON FOLLOWING ROCEPHIN   Culture PENDING  Incomplete   Report Status PENDING  Incomplete    Coagulation Studies:  Recent Labs  04/22/15 0642  LABPROT 13.9  INR 1.05    Imaging: Dg Chest 2 View  04/22/2015   CLINICAL DATA:  Multiple sclerosis complications. Right-sided weakness. Slurred speech.  EXAM: CHEST  2 VIEW  COMPARISON:  None.  FINDINGS: Borderline enlargement of the cardiopericardial silhouette. Low lung volumes are present, causing crowding of the pulmonary vasculature.  No airspace opacity or pleural effusion. Thoracic spondylosis noted. Old healed right rib fracture.  IMPRESSION: 1. Borderline enlargement of the cardiopericardial silhouette, without overt edema. 2. Thoracic spondylosis.   Electronically Signed   By: Gaylyn Rong M.D.   On: 04/22/2015 15:46   Ct Head Wo  Contrast  04/22/2015   CLINICAL DATA:  Code stroke, RIGHT-sided weakness. History of brain lesion.  EXAM: CT HEAD WITHOUT CONTRAST  TECHNIQUE: Contiguous axial images were obtained from the base of the skull through the vertex without intravenous contrast.  COMPARISON:  None.  FINDINGS: The ventricles and sulci are normal. No intraparenchymal hemorrhage, mass effect nor midline shift. No acute large vascular territory infarcts. Patchy white matter hypodensities are advanced for age.  No abnormal extra-axial fluid collections. Basal cisterns are patent.  No skull fracture. The included ocular globes and orbital contents are non-suspicious. The mastoid aircells and included paranasal sinuses are well-aerated.  IMPRESSION: No acute intracranial process.  Mild to moderate white matter changes advanced for age, nonspecific though can be seen with chronic small vessel ischemic disease or demyelination  Acute findings discussed with and reconfirmed by Dr.SCOTT GOLDSTON on 04/22/2015 at 7:00 am.   Electronically Signed   By: Awilda Metro M.D.   On: 04/22/2015 06:59   Mr Brain Wo Contrast  04/22/2015   CLINICAL DATA:  Right-sided weakness  EXAM: MRI HEAD WITHOUT CONTRAST  TECHNIQUE: Multiplanar, multiecho pulse sequences of the brain and surrounding structures were obtained without intravenous contrast.  COMPARISON:  CT head 04/22/2015  FINDINGS: Negative for acute infarct.  Extensive white matter disease for age. Patchy hyperintensities are present in the subcortical and deep white matter bilaterally throughout both cerebral hemispheres with relative sparing of the periventricular white matter. Hyperintensity also noted throughout the brainstem. Sparing of the basal ganglia. No cortical infarct.  Ventricle size normal. Cerebral volume normal. Pituitary normal in size. Craniocervical junction normal. Negative for Chiari malformation.  Negative for intracranial hemorrhage or fluid collection.  Negative for mass or edema.   Paranasal sinuses clear.  Normal orbit.  IMPRESSION: Chronic white matter changes, advanced for age. This involves the subcortical and deep white matter of both cerebral hemispheres as well as the pons. No acute infarct.  This pattern could be related to chronic microvascular ischemic change. Consider CADASIL (Cerebral autosomal dominant arteriopathy with subcortical infarcts and leukoencephalopathy . Correlate with risk factors for small vessel ischemia. Vasculitis is a consideration. Demyelinating disease is possible however the imaging pattern is not typical for multiple sclerosis.   Electronically Signed   By: Marlan Palau M.D.   On: 04/22/2015 09:16    Medications:  Scheduled: . cyclobenzaprine  5 mg Oral BID  . enoxaparin (LOVENOX) injection  40 mg Subcutaneous Q24H  . hydroxychloroquine  300 mg Oral Daily  . lipase/protease/amylase  24,000 Units Oral TID AC  . OxyCODONE  10 mg Oral Q12H  . pantoprazole  40 mg Oral Daily  . sertraline  100 mg Oral Daily  . sodium chloride  3 mL Intravenous Q12H  . topiramate  100 mg Oral Daily  . topiramate  50 mg Oral QHS    Assessment/Plan:  39 y.o. 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 Has had extensive outpatient workup at [facility] and [facility] with unclear etiology. Clinical history and MRI brain raises possibility of CADASIL. Of note, there are multiple functional deficits on her exam which clouds the picture. As she is clinically stable, we will order NOTCH-3 genetic testing for CADASIL and defer further workup to her outpatient teams.  -NOTCH-3 genetic testing. Results to be followed up by outpatient neurologist/rheumatologist -suggest ASA  daily -consider rehab evaluation -will follow as needed   LOS: 1 day   Elspeth Cho, DO [facility] [phone]  If 7pm- 7am, please page  neurology on call as listed in AMION. 04/23/2015  7:29 AM

## 2015-04-23 NOTE — Discharge Summary (Signed)
Family Medicine Teaching [facility] Discharge Summary  Patient name: Mary [name] Medical record number: [id number] Date of birth: [birth year] Age: 39 y.o. Gender: female Date of Admission: 04/22/2015  Date of Discharge: 04/25/2015 Admitting Physician: Doreene Eland, MD  Primary Care Provider: No primary care provider on file. Consultants: Neurology   Indication for Hospitalization: CVA R/O  Discharge Diagnoses/Problem List:  Patient Active Problem List   Diagnosis Date Noted  . Stuttering   . UTI (lower urinary tract infection) 04/22/2015  . Fever   . Cephalalgia   . Right sided weakness     Disposition: Home with HH PT, OT, and Aid  Discharge Condition: Stable  Discharge Exam:  General: lying in bed in NAD  Cardiovascular: RRR. No murmurs appreciated.  Respiratory: CTAB, NWOB Abdomen: soft, NTND Neuro: Stuttering speech. R sided weakness UE & LE. Sensation decreased on right compared to left. CN otherwise grossly intact.  Psych: Normal affect. Slowed reactions and speech pattern.   Brief Hospital Course:  Mary [name] is a 39 y.o. female who presented with sudden onset of headache, stuttering speech, and right sided weakness. 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 [facility] rheumatology considering lupus and they have placed her on Plaquenil in the past few months with some improvement.   CT head was negative for acute process. MRI negative for acute infarct; demonstrated known white matter changes. Neurology was consulted and they did not feel this was a stroke. They ordered NOTCH-3 genetic testing for further workup by outpatient team. Daily ASA was started. Risk stratification labs were ordered. TSH was elevated to 7.2 and T3/T4 WNL. HgBA1C 5.3 and lipid panel: HDL 34, LDL 102, and Cholesterol 171.   Patient had 2 UA's collected while in the hospital. First UA showed large LE, negative  nitrites, WBC TNTC, many bacteria and many squamous epithelial cells. She received rocephin x1 and urine culture was NGTD. Repeat UA demonstrated moderate LE, negative nitrites, 21-50 WBCs, many bacteria, and many squamous epithelial cells. Patient denied urinary symptoms and was afebrile throughout hospital course. She did have a mild leukocytosis to 16 but at discharge WBC 6.1. CXR was negative for respiratory processes. Blood culture was NGTD.   OT/PT were consulted. Both recommended CIR. PM&R did not believe that patient was a good candidate for CIR 2/2 to the fact that patient is well known to them and apparently her right sided weakness has tended to come and go. Patient and her mother declined SNF placement. Patient discharged with Home Health PT/OT.  Issues for Follow Up:  1. Elevated TSH: T3 and T4 WNL. Consider repeat thyroid testing in 6 weeks.  2. Headaches: Increased Topamax to prior home dose of  AM and  PM to control pain while hospitalized. Had been recently decreased to 50 mg in July by PM&R.  3. Right Side Weakness: CT and MRI negative for acute process. PT/OT rehab as well as outpatient neurology and rheumatology follow up.   Significant Procedures: None   Significant Labs and Imaging:   Recent Labs Lab 04/22/15 0640 04/22/15 0642 04/23/15 0408  WBC  --  16.0* 6.1  HGB 12.9 11.8* 11.2*  HCT 38.0 35.1* 34.4*  PLT  --  212 178    Recent Labs Lab 04/22/15 0640 04/22/15 0642 04/23/15 0408  NA 141 135 137  K 3.7 3.6 3.7  CL 110 109 109  CO2  --  16* 19*  GLUCOSE  118* 117* 87  BUN 10 9 10   CREATININE 0.40* 0.61 0.64  CALCIUM  --  8.7* 8.7*  ALKPHOS  --  89  --   AST  --  25  --   ALT  --  14  --   ALBUMIN  --  3.8  --    Dg Chest 2 View  04/22/2015   CLINICAL DATA:  Multiple sclerosis complications. Right-sided weakness. Slurred speech.  EXAM: CHEST  2 VIEW  COMPARISON:  None.  FINDINGS: Borderline enlargement of the cardiopericardial silhouette. Low  lung volumes are present, causing crowding of the pulmonary vasculature.  No airspace opacity or pleural effusion. Thoracic spondylosis noted. Old healed right rib fracture.  IMPRESSION: 1. Borderline enlargement of the cardiopericardial silhouette, without overt edema. 2. Thoracic spondylosis.   Electronically Signed   By: Gaylyn Rong M.D.   On: 04/22/2015 15:46   Ct Head Wo Contrast  04/22/2015   CLINICAL DATA:  Code stroke, RIGHT-sided weakness. History of brain lesion.  EXAM: CT HEAD WITHOUT CONTRAST  TECHNIQUE: Contiguous axial images were obtained from the base of the skull through the vertex without intravenous contrast.  COMPARISON:  None.  FINDINGS: The ventricles and sulci are normal. No intraparenchymal hemorrhage, mass effect nor midline shift. No acute large vascular territory infarcts. Patchy white matter hypodensities are advanced for age.  No abnormal extra-axial fluid collections. Basal cisterns are patent.  No skull fracture. The included ocular globes and orbital contents are non-suspicious. The mastoid aircells and included paranasal sinuses are well-aerated.  IMPRESSION: No acute intracranial process.  Mild to moderate white matter changes advanced for age, nonspecific though can be seen with chronic small vessel ischemic disease or demyelination  Acute findings discussed with and reconfirmed by Dr.SCOTT GOLDSTON on 04/22/2015 at 7:00 am.   Electronically Signed   By: Awilda Metro M.D.   On: 04/22/2015 06:59   Mr Brain Wo Contrast  04/22/2015   CLINICAL DATA:  Right-sided weakness  EXAM: MRI HEAD WITHOUT CONTRAST  TECHNIQUE: Multiplanar, multiecho pulse sequences of the brain and surrounding structures were obtained without intravenous contrast.  COMPARISON:  CT head 04/22/2015  FINDINGS: Negative for acute infarct.  Extensive white matter disease for age. Patchy hyperintensities are present in the subcortical and deep white matter bilaterally throughout both cerebral hemispheres  with relative sparing of the periventricular white matter. Hyperintensity also noted throughout the brainstem. Sparing of the basal ganglia. No cortical infarct.  Ventricle size normal. Cerebral volume normal. Pituitary normal in size. Craniocervical junction normal. Negative for Chiari malformation.  Negative for intracranial hemorrhage or fluid collection.  Negative for mass or edema.  Paranasal sinuses clear.  Normal orbit.  IMPRESSION: Chronic white matter changes, advanced for age. This involves the subcortical and deep white matter of both cerebral hemispheres as well as the pons. No acute infarct.  This pattern could be related to chronic microvascular ischemic change. Consider CADASIL (Cerebral autosomal dominant arteriopathy with subcortical infarcts and leukoencephalopathy . Correlate with risk factors for small vessel ischemia. Vasculitis is a consideration. Demyelinating disease is possible however the imaging pattern is not typical for multiple sclerosis.   Electronically Signed   By: Marlan Palau M.D.   On: 04/22/2015 09:16    Results/Tests Pending at Time of Discharge: None   Discharge Medications:    Medication List    ASK your doctor about these medications        clonazePAM 0.5 MG tablet  Commonly known as:  KLONOPIN  Take  0.5 mg by mouth 2 (two) times daily as needed for anxiety.     cyclobenzaprine 5 MG tablet  Commonly known as:  FLEXERIL  Take 5 mg by mouth 2 (two) times daily.     hydroxychloroquine 200 MG tablet  Commonly known as:  PLAQUENIL  Take 300 mg by mouth daily.     lipase/protease/amylase 16109 UNITS Cpep capsule  Commonly known as:  CREON  Take 24,000 Units by mouth 3 (three) times daily before meals.     ondansetron 4 MG tablet  Commonly known as:  ZOFRAN  Take 4 mg by mouth every 8 (eight) hours as needed for nausea or vomiting.     OxyCODONE 10 mg T12a 12 hr tablet  Commonly known as:  OXYCONTIN  Take 10 mg by mouth See admin instructions.  Takes twice daily. Can take up to four times a day as needed for pain     pantoprazole 40 MG tablet  Commonly known as:  PROTONIX  Take 40 mg by mouth daily.     promethazine 25 MG tablet  Commonly known as:  PHENERGAN  Take 25 mg by mouth every 6 (six) hours as needed for nausea or vomiting.     sertraline 100 MG tablet  Commonly known as:  ZOLOFT  Take 100 mg by mouth daily.     topiramate 100 MG tablet  Commonly known as:  TOPAMAX  Take 50 mg by mouth 2 (two) times daily.     traMADol 50 MG tablet  Commonly known as:  ULTRAM  Take 50 mg by mouth every 6 (six) hours as needed for moderate pain.        Discharge Instructions: Please refer to Patient Instructions section of EMR for full details.  Patient was counseled important signs and symptoms that should prompt return to medical care, changes in medications, dietary instructions, activity restrictions, and follow up appointments.   Follow-Up Appointments: Follow-up Information    Follow up with PCP. Schedule an appointment as soon as possible for a visit in 1 week.   Why:  For Hospital Followup      Arvilla Market, DO 04/25/2015, 11:14 AM PGY-1, [facility]

## 2015-04-23 NOTE — Progress Notes (Signed)
Patient's ability to move her right extremities have not changed during the night.  She still can not move or lift her RUE and RLE, but she can move her toes on her right foot.  Patient also is still complaining of a headache.

## 2015-04-23 NOTE — Progress Notes (Signed)
Rehab Admissions Coordinator Note:  Patient was screened by Trish Mage for appropriateness for an Inpatient Acute Rehab Consult.  At this time, we are recommending Inpatient Rehab consult.  Lelon Frohlich M 04/23/2015, 3:08 PM  I can be reached at [phone].

## 2015-04-23 NOTE — Progress Notes (Signed)
Family Medicine Teaching Service Daily Progress Note Intern Pager: [phone]  Patient name: Mary [name] Medical record number: [id number] Date of birth: 01-13-1976 Age: 39 y.o. Gender: female  Primary Care Provider: No primary care provider on file. Consultants: Neurology Code Status: Full  Pt Overview and Major Events to Date:  9/4: Admitted for CVA r/o and ?UTI; MRI head negative for acute infarct  Assessment and Plan: Reann Dobias is a 39 y.o. female presenting with sudden onset of headache, stuttering speech, right sided weakness who was brought to ED as a code stroke. Additional symptoms at home include confusion, teeth chattering and jerky movements of arms and legs at home. Found to have elevated WBC and a possible dirty UTI although may not be a clean catch. Afebrile. Hypotension noted in ED although mother states this is her baseline. 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 CT negative for acute process. Stroke is unlike per neurology. 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 Neurology at [facility] verified this is not MS and possibly psychogenic in etiology. Neurology at [facility] thinks it is organic but etiology is unknown. Patient was sent to [facility] rheumatology who are considering lupus and put her on Plaquenil in the past few months with some improvement. Referral made to [facility] rheumatology for second opinion, appt in October. - neurology following patient >> clinically stable so will order NOTCH-3 genetic testing for further workup by outpatient team  - neuro checks q 2 hrs - continue home Plaquanil  -OT/PT consult ordered  -risk stratification labs ordered: TSH, HgBA1C, lipid  panel -consider ASA therapy and statin therapy   ?UTI: asymptomatic. UA shows large LE, negative nitrites, WBC TNTC, many bacteria and many squames  - repeat UA due to contaminated sample -urine cx from original sample in process   Leukocytosis: No clear source and UTI not well supported. WBC 16 with neutrophilia 89%. Afebrile. Denies cough, URI symptoms, SOB, diarrhea, dysuria. Patient is on Plaquenil therefore at greater risk of infection due to immune suppression. - CXR to evaluate for respiratory process: negative  - repeat UA  - blood cultures in process   Hypotension: Noted to have low BP in ED with SBP 86-96. Per mother this is her baseline.  - continue to monitor   Depression/Anxiety: - home Sertraline  - home Klonopin 0.5mg  PRN   Chronic Pancreatitis: Notes of epigastric abdominal pain.  - continue home Creon   Chronic Pain/HA:  - Topamax: will increase to prior dose: 100mg  AM 50 mg PM to control pain (recently decreased to 50mg  BID in July by PM&R) - home Flexeril 5mg  BID  - home Oxycontin 10mg  q 6 hrs PRN - home Tramadol PRN - Phenergan PRN for nausea - Zofran PRN nausea  FEN/GI:  - home Protonix 40mg   - regular diet   Prophylaxis: Lovenox   Disposition: Home pending OT/PT recs   Subjective:  Patient complaining of occipital HA. Continued right sided weakness in upper and lower extremities but states that she now can wiggle her right toes.    Objective: Temp:  [97.8 F (36.6 C)-98.8 F (37.1 C)] 97.8 F (36.6 C) (09/05 0536) Pulse Rate:  [42-97] 42 (09/05 0536) Resp:  [13-25] 19 (09/05 0536) BP: (80-106)/(47-68) 85/47 mmHg (  09/05 0536) SpO2:  [95 %-100 %] 100 % (09/05 0536) Weight:  [201 lb (91.173 kg)] 201 lb (91.173 kg) (09/04 1600) Physical Exam: General: lying in bed in NAD  Cardiovascular: RRR. No murmurs appreciated.  Respiratory: CTAB, NWOB Abdomen: soft, NTND Neuro: Stuttering speech. R sided weakness UE & LE. Sensation decreased on  right compared to left. CN otherwise grossly intact.  Psych: Normal affect. Slowed reactions and speech pattern.   Laboratory:  Recent Labs Lab 04/22/15 0640 04/22/15 0642 04/23/15 0408  WBC  --  16.0* 6.1  HGB 12.9 11.8* 11.2*  HCT 38.0 35.1* 34.4*  PLT  --  212 178    Recent Labs Lab 04/22/15 0640 04/22/15 0642 04/23/15 0408  NA 141 135 137  K 3.7 3.6 3.7  CL 110 109 109  CO2  --  16* 19*  BUN 10 9 10   CREATININE 0.40* 0.61 0.64  CALCIUM  --  8.7* 8.7*  PROT  --  8.2*  --   BILITOT  --  0.1*  --   ALKPHOS  --  89  --   ALT  --  14  --   AST  --  25  --   GLUCOSE 118* 117* 87     Imaging/Diagnostic Tests: Dg Chest 2 View  04/22/2015   CLINICAL DATA:  Multiple sclerosis complications. Right-sided weakness. Slurred speech.  EXAM: CHEST  2 VIEW  COMPARISON:  None.  FINDINGS: Borderline enlargement of the cardiopericardial silhouette. Low lung volumes are present, causing crowding of the pulmonary vasculature.  No airspace opacity or pleural effusion. Thoracic spondylosis noted. Old healed right rib fracture.  IMPRESSION: 1. Borderline enlargement of the cardiopericardial silhouette, without overt edema. 2. Thoracic spondylosis.   Electronically Signed   By: Gaylyn Rong M.D.   On: 04/22/2015 15:46   Ct Head Wo Contrast  04/22/2015   CLINICAL DATA:  Code stroke, RIGHT-sided weakness. History of brain lesion.  EXAM: CT HEAD WITHOUT CONTRAST  TECHNIQUE: Contiguous axial images were obtained from the base of the skull through the vertex without intravenous contrast.  COMPARISON:  None.  FINDINGS: The ventricles and sulci are normal. No intraparenchymal hemorrhage, mass effect nor midline shift. No acute large vascular territory infarcts. Patchy white matter hypodensities are advanced for age.  No abnormal extra-axial fluid collections. Basal cisterns are patent.  No skull fracture. The included ocular globes and orbital contents are non-suspicious. The mastoid aircells and  included paranasal sinuses are well-aerated.  IMPRESSION: No acute intracranial process.  Mild to moderate white matter changes advanced for age, nonspecific though can be seen with chronic small vessel ischemic disease or demyelination  Acute findings discussed with and reconfirmed by Dr.SCOTT GOLDSTON on 04/22/2015 at 7:00 am.   Electronically Signed   By: Awilda Metro M.D.   On: 04/22/2015 06:59   Mr Brain Wo Contrast  04/22/2015   CLINICAL DATA:  Right-sided weakness  EXAM: MRI HEAD WITHOUT CONTRAST  TECHNIQUE: Multiplanar, multiecho pulse sequences of the brain and surrounding structures were obtained without intravenous contrast.  COMPARISON:  CT head 04/22/2015  FINDINGS: Negative for acute infarct.  Extensive white matter disease for age. Patchy hyperintensities are present in the subcortical and deep white matter bilaterally throughout both cerebral hemispheres with relative sparing of the periventricular white matter. Hyperintensity also noted throughout the brainstem. Sparing of the basal ganglia. No cortical infarct.  Ventricle size normal. Cerebral volume normal. Pituitary normal in size. Craniocervical junction normal. Negative for Chiari malformation.  Negative for  intracranial hemorrhage or fluid collection.  Negative for mass or edema.  Paranasal sinuses clear.  Normal orbit.  IMPRESSION: Chronic white matter changes, advanced for age. This involves the subcortical and deep white matter of both cerebral hemispheres as well as the pons. No acute infarct.  This pattern could be related to chronic microvascular ischemic change. Consider CADASIL (Cerebral autosomal dominant arteriopathy with subcortical infarcts and leukoencephalopathy . Correlate with risk factors for small vessel ischemia. Vasculitis is a consideration. Demyelinating disease is possible however the imaging pattern is not typical for multiple sclerosis.   Electronically Signed   By: Marlan Palau M.D.   On: 04/22/2015 09:16     Arvilla Market, DO 04/23/2015, 8:54 AM PGY-1, [facility] FPTS Intern pager: [phone], text pages welcome

## 2015-04-23 NOTE — Progress Notes (Signed)
Physical medicine rehabilitation consult requested chart reviewed. Patient presented with slurred speech and right-sided weakness. MRI showing no acute infarct. Workup currently ongoing. 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 Internal medicine workup for possible acute infectious process. Hold on formal rehabilitation consult at this time with workup ongoing and continue to follow.

## 2015-04-24 LAB — T3, FREE: T3, Free: 2.1 pg/mL (ref 2.0–4.4)

## 2015-04-24 MED ORDER — OXYCODONE-ACETAMINOPHEN 5-325 MG PO TABS
1.0000 | ORAL_TABLET | Freq: Once | ORAL | Status: DC
  Filled 2015-04-24: qty 1

## 2015-04-24 MED ORDER — ASPIRIN 81 MG PO CHEW
81.0000 mg | CHEWABLE_TABLET | Freq: Every day | ORAL | Status: DC
  Administered 2015-04-24 – 2015-04-25 (×2): 81 mg via ORAL
  Filled 2015-04-24 (×2): qty 1

## 2015-04-24 MED ORDER — HYDROMORPHONE HCL 1 MG/ML IJ SOLN
0.5000 mg | Freq: Once | INTRAMUSCULAR | Status: AC
  Administered 2015-04-24: 0.5 mg via INTRAVENOUS
  Filled 2015-04-24: qty 1

## 2015-04-24 MED ORDER — OXYCODONE HCL 5 MG PO TABS
10.0000 mg | ORAL_TABLET | Freq: Four times a day (QID) | ORAL | Status: DC | PRN
  Administered 2015-04-24 – 2015-04-25 (×2): 10 mg via ORAL
  Filled 2015-04-24 (×2): qty 2

## 2015-04-24 NOTE — Evaluation (Signed)
Occupational Therapy Evaluation Patient Details Name: Mary [name] MRN: [id number] DOB: [birth year] Today's Date: 04/24/2015    History of Present Illness Pt is a 39 y/o F admitted 2/2 onset of Rt sided weakness, headache and stuttering speech who was brought to ED as a code stroke.  MRI neg for acute infarct. CT neg for acute process. Stroke is unlikely per neurology. 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    Clinical Impression   Pt admitted to hospital due to reason stated above. Pt currently with functional limitiations in ADLs and mobility due to R UE and R LE deficits. Prior to admission pt was independent with all ADLs. Pt currently requires max A for completion of ADLs due R UE and R LE weakness and fatigue. Pt reported feeling tired and sleepy during session. Pt will benefit from skilled OT increase her independence and safety with ADLs to allow discharge to venue listed below.    Follow Up Recommendations  CIR    Equipment Recommendations  3 in 1 bedside comode    Recommendations for Other Services       Precautions / Restrictions Precautions Precautions: Fall Restrictions Weight Bearing Restrictions: No      Mobility Bed Mobility Overal bed mobility: Needs Assistance Bed Mobility: Rolling;Sidelying to Sit (Pt hooked R LE with L LE in order to lift R LE to EOB) Rolling: Supervision (Supervision for safety and cueing for instructions) Sidelying to sit: Modified independent (Device/Increase time);Supervision (Used bed rail to propel herself to sitting position)          Transfers Overall transfer level: Needs assistance   Transfers: Sit to/from Stand Sit to Stand: Max assist         General transfer comment: Pt requires R knee block while performing transfer due R UE weakness    Balance Overall balance assessment: Needs assistance Sitting-balance support:  Feet supported;Single extremity supported (L UE support) Sitting balance-Leahy Scale: Fair Sitting balance - Comments: Pt drifted to left side while sitting EOB     Standing balance-Leahy Scale: Poor                              ADL Overall ADL's : Needs assistance/impaired Eating/Feeding: Set up;Maximal assistance;Bed level;Sitting Eating/Feeding Details (indicate cue type and reason): Pt unable to use dominant R UE hand Grooming: Set up;Maximal assistance;Sitting   Upper Body Bathing: Set up;Maximal assistance;Sitting;With adaptive equipment   Lower Body Bathing: Set up;Maximal assistance;Cueing for safety;Sit to/from stand;With adaptive equipment Lower Body Bathing Details (indicate cue type and reason): Due to R LE deficits Upper Body Dressing : Set up;Maximal assistance;Cueing for compensatory techniques;Sitting;Cueing for safety Upper Body Dressing Details (indicate cue type and reason): Due R UE deficits Lower Body Dressing: Set up;Maximal assistance;Cueing for safety;Cueing for compensatory techniques;Sit to/from stand Lower Body Dressing Details (indicate cue type and reason): Due R LE deficits Toilet Transfer: Set up;Maximal assistance;Stand-pivot;BSC;Cueing for safety   Toileting- Clothing Manipulation and Hygiene: Maximal assistance;Sit to/from stand Toileting - Clothing Manipulation Details (indicate cue type and reason): Pt unable to manipulate gown once sitting on BSC Tub/ Shower Transfer: Maximal assistance;Cueing for safety;Shower seat   Functional mobility during ADLs: Maximal assistance General ADL Comments: Pt R UE illicts response to deep pressure and nail bed pressure     Vision Vision Assessment?: Vision impaired- to be further tested in functional context   Perception  Praxis      Pertinent Vitals/Pain Pain Assessment: 0-10 Pain Score: 9  Pain Location: Head Pain Descriptors / Indicators: Constant Pain Intervention(s): Monitored  during session;Repositioned;Other (comment) (Pt was given a warm wash cloth to place over head)     Hand Dominance Right   Extremity/Trunk Assessment Upper Extremity Assessment Upper Extremity Assessment: RUE deficits/detail RUE Deficits / Details: Pt R UE does appear to be zero with MMT, however when transfering EOB R UE tone was present RUE Sensation: decreased light touch RUE Coordination: decreased fine motor;decreased gross motor   Lower Extremity Assessment Lower Extremity Assessment: Defer to PT evaluation;RLE deficits/detail RLE Deficits / Details: Pt able to wiggle toes and some tone present while sitting EOB RLE Sensation: decreased light touch RLE Coordination: decreased gross motor (Needs assistance from LLE )   Cervical / Trunk Assessment Cervical / Trunk Assessment: Normal   Communication Communication Communication: Other (comment);Expressive difficulties (Pt stutters)   Cognition Arousal/Alertness: Lethargic (Pt reported feeling sleepy and tired) Behavior During Therapy: Anxious Overall Cognitive Status: No family/caregiver present to determine baseline cognitive functioning                     General Comments       Exercises       Shoulder Instructions      Home Living Family/patient expects to be discharged to:: Private residence Living Arrangements: Parent Available Help at Discharge: Family;Available PRN/intermittently Type of Home: House Home Access: Ramped entrance     Home Layout: One level     Bathroom Shower/Tub: Tub only   Firefighter: Standard Bathroom Accessibility: Yes How Accessible: Accessible via walker Home Equipment: Shower seat;Wheelchair - manual;Walker - standard;Bedside commode   Additional Comments: Pt lives at home w/ mother who is currently unavailable during the day for 4 months for work training but pt's Aunt will be living w/ pt and her mother for these 4 months and will be able to provide 24/7 assist to  pt.       Prior Functioning/Environment Level of Independence: Needs assistance  Gait / Transfers Assistance Needed: Pt's history is unclear. Initially she reports she did not use an AD PTA; however, later in session she says she would usually use the [facility] (which is now broken) and would use WC Ind for long distances. ADL's / Homemaking Assistance Needed: Per pt Ind PTA w/ all ADLs Communication / Swallowing Assistance Needed: WNL per pt      OT Diagnosis: Generalized weakness;Disturbance of vision   OT Problem List: Decreased strength;Decreased activity tolerance;Decreased coordination;Increased edema;Impaired sensation   OT Treatment/Interventions: Self-care/ADL training;Therapeutic exercise;DME and/or AE instruction;Therapeutic activities;Patient/family education    OT Goals(Current goals can be found in the care plan section) Acute Rehab OT Goals Patient Stated Goal: to return to prior level of independence OT Goal Formulation: With patient Time For Goal Achievement: 05/08/15 Potential to Achieve Goals: Good ADL Goals Pt Will Perform Upper Body Bathing: with modified independence;with set-up;with adaptive equipment;sitting Pt Will Perform Lower Body Dressing: with modified independence;with set-up;with adaptive equipment;sit to/from stand Pt Will Transfer to Toilet: with mod assist;stand pivot transfer;ambulating;bedside commode Pt Will Perform Tub/Shower Transfer: Shower transfer;Tub transfer;with mod assist;Stand pivot transfer;ambulating;shower seat Pt/caregiver will Perform Home Exercise Program: Increased strength;Both right and left upper extremity;With theraband;With written HEP provided  OT Frequency: Min 2X/week   Barriers to D/C:            Co-evaluation  End of Session Equipment Utilized During Treatment: Gait belt Nurse Communication: Other (comment) (Pt requires cotton gown and pt reported headache pain)  Activity Tolerance: Patient limited by  fatigue Patient left: with call bell/phone within reach;in bed   Time: 0720-0750 OT Time Calculation (min): 30 min Charges:  OT General Charges $OT Visit: 1 Procedure OT Evaluation $Initial OT Evaluation Tier I: 1 Procedure OT Treatments $Self Care/Home Management : 8-22 mins G-Codes:    Smiley Houseman, OTS 04/24/2015, 10:49 AM

## 2015-04-24 NOTE — Progress Notes (Signed)
Family Medicine Teaching Service Daily Progress Note Intern Pager: [phone]  Patient name: Mary [name] Medical record number: [id number] Date of birth: 1976-01-13 Age: 39 y.o. Gender: female  Primary Care Provider: No primary care provider on file. Consultants: Neurology Code Status: Full  Pt Overview and Major Events to Date:  9/4: Admitted for CVA r/o and ?UTI; MRI head negative for acute infarct  Assessment and Plan: Haile Toppins is a 39 y.o. female presenting with sudden onset of headache, stuttering speech, right sided weakness who was brought to ED as a code stroke. Additional symptoms at home include confusion, teeth chattering and jerky movements of arms and legs at home. Found to have elevated WBC and a possible dirty UTI although may not be a clean catch. Afebrile. Hypotension noted in ED although mother states this is her baseline. 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 CT negative for acute process. Stroke is unlike per neurology. 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 Neurology at [facility] verified this is not MS and possibly psychogenic in etiology. Neurology at [facility] thinks it is organic but etiology is unknown. Patient was sent to [facility] rheumatology who are considering lupus and put her on Plaquenil in the past few months with some improvement. Referral made to [facility] rheumatology for second opinion, appt in October. - neurology following patient >> clinically stable so will order NOTCH-3 genetic testing for further workup by outpatient team  - neuro checks q 2 hrs - continue home Plaquanil  -OT/PT consult ordered; recommending CIR >> will discuss case with PM&R -risk  stratification labs ordered: TSH, HgBA1C (in process), lipid panel -TSH elevated to 7.2, free T3 and T4 WNL >> recommend f/u as outpatient  -lipid panel: HDL 34, LDL 102, Cholesterol 171  -consider ASA therapy and statin therapy   ?UTI: asymptomatic. UA shows large LE, negative nitrites, WBC TNTC, many bacteria and many squames  - repeat UA: moderate LE, negative nitrites, WBC 21-50, many bacteria, many squamous epithelial cells  -urine cx: NG x1 day   Leukocytosis: No clear source and UTI not well supported. WBC 16 with neutrophilia 89%. Afebrile. Denies cough, URI symptoms, SOB, diarrhea, dysuria. Patient is on Plaquenil therefore at greater risk of infection due to immune suppression. - CXR to evaluate for respiratory process: negative  - blood cultures NG x <24 hours  -WBC 6.1 (9.5)   Hypotension: Continued to have low SBP 90s-80s. Per mother this is her baseline.  - continue to monitor   Depression/Anxiety: - home Sertraline  - home Klonopin 0.5mg  PRN   Chronic Pancreatitis: Notes of epigastric abdominal pain.  - continue home Creon   Chronic Pain/HA:  - Topamax: will increase to prior dose: 100mg  AM 50 mg PM to control pain (recently decreased to 50mg  BID in July by PM&R) - home Flexeril 5mg  BID  - home Oxycontin 10mg  q 6 hrs PRN - home Tramadol PRN - Phenergan PRN for nausea - Zofran PRN nausea  FEN/GI:  - home Protonix 40mg   - regular diet   Prophylaxis: Lovenox   Disposition: Home pending OT/PT recs   Subjective:  Patient complaining of occipital HA. Continued right sided weakness in upper and lower extremities. Did not sleep well overnight.  Objective: Temp:  [97.6 F (36.4 C)-98 F (36.7 C)] 97.6 F (36.4 C) (09/06 0540) Pulse Rate:  [66-76] 66 (09/06 0540) Resp:  [17-18] 18 (09/06 0540) BP: (88-98)/(46-53) 95/46 mmHg (09/06 0540) SpO2:  [99 %] 99 % (09/06 0540) Physical Exam: General: lying in bed in NAD  Cardiovascular: RRR. No murmurs  appreciated.  Respiratory: CTAB, NWOB Abdomen: soft, NTND Neuro: Stuttering speech. R sided weakness UE & LE. Sensation decreased on right compared to left. CN otherwise grossly intact.  Psych: Normal affect. Slowed reactions and speech pattern.   Laboratory:  Recent Labs Lab 04/22/15 0640 04/22/15 0642 04/23/15 0408  WBC  --  16.0* 6.1  HGB 12.9 11.8* 11.2*  HCT 38.0 35.1* 34.4*  PLT  --  212 178    Recent Labs Lab 04/22/15 0640 04/22/15 0642 04/23/15 0408  NA 141 135 137  K 3.7 3.6 3.7  CL 110 109 109  CO2  --  16* 19*  BUN CREATININE 0.40* 0.61 0.64  CALCIUM  --  8.7* 8.7*  PROT  --  8.2*  --   BILITOT  --  0.1*  --   ALKPHOS  --  89  --   ALT  --  14  --   AST  --  25  --   GLUCOSE 118* 117* 87     Ref Range 1d ago    Cholesterol 0 - 200 mg/dL 119   Triglycerides <147 mg/dL 829 (H)   HDL >56 mg/dL 34 (L)   Total CHOL/HDL Ratio RATIO 5.0   VLDL 0 - 40 mg/dL 35   LDL Cholesterol 0 - 99 mg/dL 213 (H)        TSH 0.865 - 4.500 uIU/mL 7.192 (H)       T3, Free 2.0 - 4.4 pg/mL 2.1          Ref Range 1d ago    Free T4 0.61 - 1.12 ng/dL 7.84            Imaging/Diagnostic Tests: Dg Chest 2 View  04/22/2015   CLINICAL DATA:  Multiple sclerosis complications. Right-sided weakness. Slurred speech.  EXAM: CHEST  2 VIEW  COMPARISON:  None.  FINDINGS: Borderline enlargement of the cardiopericardial silhouette. Low lung volumes are present, causing crowding of the pulmonary vasculature.  No airspace opacity or pleural effusion. Thoracic spondylosis noted. Old healed right rib fracture.  IMPRESSION: 1. Borderline enlargement of the cardiopericardial silhouette, without overt edema. 2. Thoracic spondylosis.   Electronically Signed   By: Gaylyn Rong M.D.   On: 04/22/2015 15:46   Ct Head Wo Contrast  04/22/2015   CLINICAL DATA:  Code stroke, RIGHT-sided weakness. History of brain lesion.  EXAM: CT HEAD WITHOUT CONTRAST  TECHNIQUE:  Contiguous axial images were obtained from the base of the skull through the vertex without intravenous contrast.  COMPARISON:  None.  FINDINGS: The ventricles and sulci are normal. No intraparenchymal hemorrhage, mass effect nor midline shift. No acute large vascular territory infarcts. Patchy white matter hypodensities are advanced for age.  No abnormal extra-axial fluid collections. Basal cisterns are patent.  No skull fracture. The included ocular globes and orbital contents are non-suspicious. The mastoid aircells and included paranasal sinuses are well-aerated.  IMPRESSION: No acute intracranial process.  Mild to moderate white matter changes advanced for age, nonspecific though can be seen with chronic small vessel ischemic disease or demyelination  Acute findings discussed with and reconfirmed by Dr.SCOTT GOLDSTON on 04/22/2015 at 7:00  am.   Electronically Signed   By: Awilda Metro M.D.   On: 04/22/2015 06:59   Mr Brain Wo Contrast  04/22/2015   CLINICAL DATA:  Right-sided weakness  EXAM: MRI HEAD WITHOUT CONTRAST  TECHNIQUE: Multiplanar, multiecho pulse sequences of the brain and surrounding structures were obtained without intravenous contrast.  COMPARISON:  CT head 04/22/2015  FINDINGS: Negative for acute infarct.  Extensive white matter disease for age. Patchy hyperintensities are present in the subcortical and deep white matter bilaterally throughout both cerebral hemispheres with relative sparing of the periventricular white matter. Hyperintensity also noted throughout the brainstem. Sparing of the basal ganglia. No cortical infarct.  Ventricle size normal. Cerebral volume normal. Pituitary normal in size. Craniocervical junction normal. Negative for Chiari malformation.  Negative for intracranial hemorrhage or fluid collection.  Negative for mass or edema.  Paranasal sinuses clear.  Normal orbit.  IMPRESSION: Chronic white matter changes, advanced for age. This involves the subcortical and deep  white matter of both cerebral hemispheres as well as the pons. No acute infarct.  This pattern could be related to chronic microvascular ischemic change. Consider CADASIL (Cerebral autosomal dominant arteriopathy with subcortical infarcts and leukoencephalopathy . Correlate with risk factors for small vessel ischemia. Vasculitis is a consideration. Demyelinating disease is possible however the imaging pattern is not typical for multiple sclerosis.   Electronically Signed   By: Marlan Palau M.D.   On: 04/22/2015 09:16    Arvilla Market, DO 04/24/2015, 9:36 AM PGY-1, [facility] FPTS Intern pager: [phone], text pages welcome

## 2015-04-24 NOTE — Consult Note (Signed)
Physical Medicine and Rehabilitation Consult Reason for Consult: Chronic white matter brain disorder Referring Physician: Family medicine   HPI: Mary [name] is a 39 y.o. 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 She has had extensive outpatient workup at [facility] as well as [facility] with unclear etiology. Patient lives with her extended family used an occasional cane walker prior to admission but stated to be independent. Recent follow-up at [facility] rheumatology considering lupus had recently been placed on plaquenil  a few months ago. Presented 04/22/2015 with right-sided weakness and stuttering speech. Denied any recent fall or head injury. CT MRI imaging of the brain negative for acute infarct. There was consideration of possible cerebral autosomal dominant arteriopathy with subcortical infarcts and leukoencephalopathy. Neurology follow-up with NOTCH-3 genetic testing which should be followed up as an outpatient. Subcutaneous Lovenox for DVT prophylaxis. Physical therapy occupational therapy evaluations completed. Recommendations made for physical medicine rehabilitation consult.   Review of Systems  Constitutional: Negative for fever and chills.  Eyes: Negative for blurred vision and double vision.  Respiratory: Negative for cough and shortness of breath.   Cardiovascular: Positive for leg swelling. Negative for chest pain and palpitations.  Gastrointestinal: Positive for nausea and constipation. Negative for abdominal pain.       GERD  Skin: Negative for rash.  Neurological: Positive for dizziness and weakness.  Psychiatric/Behavioral: Positive for depression.       Anxiety   Past Medical History  Diagnosis Date  . GERD (gastroesophageal reflux disease)   . Depression   . Anxiety   . Abdominal hernia    Past Surgical History  Procedure Laterality Date  . Abdominal surgery      . Roux-en-y gastric bypass    . Cholecystectomy     History reviewed. No pertinent family history. Social History:  reports that she quit smoking about 23 months ago. She has never used smokeless tobacco. She reports that she does not drink alcohol or use illicit drugs. Allergies:  Allergies  Allergen Reactions  . Latex Rash  . Morphine And Related Anaphylaxis    unknown  . Buprenorphine Other (See Comments)    unknown  . Amoxicillin Rash  . Penicillins Rash   Medications Prior to Admission  Medication Sig Dispense Refill  . clonazePAM (KLONOPIN) 0.5 MG tablet Take 0.5 mg by mouth 2 (two) times daily as needed for anxiety.    . cyclobenzaprine (FLEXERIL) 5 MG tablet Take 5 mg by mouth 2 (two) times daily.    . hydroxychloroquine (PLAQUENIL) 200 MG tablet Take 300 mg by mouth daily.     . lipase/protease/amylase (CREON) 12000 UNITS CPEP capsule Take 24,000 Units by mouth 3 (three) times daily before meals.    . ondansetron (ZOFRAN) 4 MG tablet Take 4 mg by mouth every 8 (eight) hours as needed for nausea or vomiting.    . OxyCODONE (OXYCONTIN) 10 mg T12A 12 hr tablet Take 10 mg by mouth See admin instructions. Takes twice daily. Can take up to four times a day as needed for pain    . pantoprazole (PROTONIX) 40 MG tablet Take 40 mg by mouth daily.    . promethazine (PHENERGAN) 25 MG tablet Take 25 mg by mouth every 6 (six) hours as needed for nausea or vomiting.    . sertraline (ZOLOFT) 100 MG tablet Take 100 mg by mouth daily.    Marland Kitchen topiramate (TOPAMAX)  100 MG tablet Take 50 mg by mouth 2 (two) times daily.    . traMADol (ULTRAM) 50 MG tablet Take 50 mg by mouth every 6 (six) hours as needed for moderate pain.      Home: Home Living Family/patient expects to be discharged to:: Private residence Living Arrangements: Parent Available Help at Discharge: Family, Available PRN/intermittently Type of Home: House Home Access: Ramped entrance Home Layout: One level Bathroom Shower/Tub:  Tub only Firefighter: Pharmacist, community: Yes Home Equipment: Information systems manager, Wheelchair - manual, Environmental consultant - standard, Bedside commode Additional Comments: Pt lives at home w/ mother who is currently unavailable during the day for 4 months for work training but pt's Aunt will be living w/ pt and her mother for these 4 months and will be able to provide 24/7 assist to pt.   Functional History: Prior Function Level of Independence: Needs assistance Gait / Transfers Assistance Needed: Pt's history is unclear. Initially she reports she did not use an AD PTA; however, later in session she says she would usually use the [facility] (which is now broken) and would use WC Ind for long distances. ADL's / Homemaking Assistance Needed: Per pt Ind PTA w/ all ADLs Communication / Swallowing Assistance Needed: WNL per pt Functional Status:  Mobility: Bed Mobility Overal bed mobility: Needs Assistance Bed Mobility: Rolling, Sidelying to Sit (Pt hooked R LE with L LE in order to lift R LE to EOB) Rolling: Supervision (Supervision for safety and cueing for instructions) Sidelying to sit: Modified independent (Device/Increase time), Supervision (Used bed rail to propel herself to sitting position) General bed mobility comments: Pt sitting in recliner chair w/ nurse tech in room upon PT arrival Transfers Overall transfer level: Needs assistance Equipment used: Rolling walker (2 wheeled) Transfers: Sit to/from Stand Sit to Stand: Max assist General transfer comment: Pt requires R knee block while performing transfer due R UE weakness Ambulation/Gait General Gait Details: deferred this session, unable to elicit voluntary contraction in Rt UE/LE    ADL: ADL Overall ADL's : Needs assistance/impaired Eating/Feeding: Set up, Maximal assistance, Bed level, Sitting Eating/Feeding Details (indicate cue type and reason): Pt unable to use dominant R UE hand Grooming: Set up, Maximal assistance,  Sitting Upper Body Bathing: Set up, Maximal assistance, Sitting, With adaptive equipment Lower Body Bathing: Set up, Maximal assistance, Cueing for safety, Sit to/from stand, With adaptive equipment Lower Body Bathing Details (indicate cue type and reason): Due to R LE deficits Upper Body Dressing : Set up, Maximal assistance, Cueing for compensatory techniques, Sitting, Cueing for safety Upper Body Dressing Details (indicate cue type and reason): Due R UE deficits Lower Body Dressing: Set up, Maximal assistance, Cueing for safety, Cueing for compensatory techniques, Sit to/from stand Lower Body Dressing Details (indicate cue type and reason): Due R LE deficits Toilet Transfer: Set up, Maximal assistance, Stand-pivot, BSC, Cueing for safety Toileting- Clothing Manipulation and Hygiene: Maximal assistance, Sit to/from stand Toileting - Clothing Manipulation Details (indicate cue type and reason): Pt unable to manipulate gown once sitting on BSC Tub/ Shower Transfer: Maximal assistance, Cueing for safety, Shower seat Functional mobility during ADLs: Maximal assistance General ADL Comments: Pt R UE illicts response to deep pressure and nail bed pressure  Cognition: Cognition Overall Cognitive Status: No family/caregiver present to determine baseline cognitive functioning Orientation Level: Oriented to person, Oriented to place, Oriented to time Cognition Arousal/Alertness: Lethargic (Pt reported feeling sleepy and tired) Behavior During Therapy: Anxious Overall Cognitive Status: No family/caregiver present to determine baseline  cognitive functioning  Blood pressure 95/46, pulse 66, temperature 97.6 F (36.4 C), temperature source Oral, resp. rate 18, height 5\' 3"  (1.6 m), weight 91.173 kg (201 lb), last menstrual period 04/09/2015, SpO2 99 %. Physical Exam  HENT:  No facial weakness  Eyes:  Pupils reactive to light  Neck: Normal range of motion. Neck supple. No thyromegaly present.   Cardiovascular: Normal rate and regular rhythm.   Respiratory: Effort normal and breath sounds normal. No respiratory distress.  GI: Soft. Bowel sounds are normal. She exhibits no distension.  Neurological: She is alert.  She has a bit of stuttering halting speech but fully intelligible. Follows commands. Oriented to person place and time. Follows simple commands. She easily became anxious during exam  Skin: Skin is warm and dry.    Results for orders placed or performed during the hospital encounter of 04/22/15 (from the past 24 hour(s))  T3, free     Status: None   Collection Time: 04/23/15  3:00 PM  Result Value Ref Range   T3, Free 2.1 2.0 - 4.4 pg/mL  T4, free     Status: None   Collection Time: 04/23/15  3:00 PM  Result Value Ref Range   Free T4 0.68 0.61 - 1.12 ng/dL   Dg Chest 2 View  08/23/1094   CLINICAL DATA:  Multiple sclerosis complications. Right-sided weakness. Slurred speech.  EXAM: CHEST  2 VIEW  COMPARISON:  None.  FINDINGS: Borderline enlargement of the cardiopericardial silhouette. Low lung volumes are present, causing crowding of the pulmonary vasculature.  No airspace opacity or pleural effusion. Thoracic spondylosis noted. Old healed right rib fracture.  IMPRESSION: 1. Borderline enlargement of the cardiopericardial silhouette, without overt edema. 2. Thoracic spondylosis.   Electronically Signed   By: Gaylyn Rong M.D.   On: 04/22/2015 15:46    Assessment/Plan: Diagnosis:right sided weakness.  Ms Abbruzzese is well known to me. She has had baseline right sided weakness which has tended to "come and go" without any clear etiology although the presumptive diagnosis has been MS. Without any new findings upon this admission it would be hard for me to justify an admission to inpatient rehab.   Recommend home health PT, OT  Ranelle Oyster, MD, [facility] Physical Medicine & Rehabilitation 04/24/2015    04/24/2015

## 2015-04-24 NOTE — Progress Notes (Addendum)
Physical Therapy Treatment Patient Details Name: Mary [name] MRN: [id number] DOB: [birth year] Today's Date: 04/24/2015    History of Present Illness Pt is a 39 y/o F admitted 2/2 onset of Rt sided weakness, headache and stuttering speech who was brought to ED as a code stroke.  MRI neg for acute infarct. CT neg for acute process. Stroke is unlikely per neurology. 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 Neurology at [facility] verified this is not MS and possibly psychogenic in etiology. Neurology at [facility] thinks it is organic but etiology is unknown. Patient was sent to [facility] rheumatology who are considering lupus and put her on Plaquenil in the past few months with some improvement. Referral made to [facility] rheumatology for second opinion, appt in October.  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     PT Comments    Mary [name] was anxious about transfer but was able to stand w/ +2 assist and participate in pre-gait training stepping activity w/ Bil UEs supported (see notes below for more detail).  Tone noted in Rt UE/LE at variable times during session but no voluntary contraction noted.  PT will continue to progress pt's ambulatory activity as appropriate at next session.  Pt will benefit from continued skilled PT services to increase functional independence and safety.  My recommendation remains for CIR; however, per MD pt has been screened and will likely not be admitted to CIR.  Because amount of assist available at home upon d/c remains unknow, recommending SNF at this time.   Follow Up Recommendations  CIR;SNF;Supervision/Assistance - 24 hour;Supervision for mobility/OOB     Equipment Recommendations  None recommended by PT (has RW and WC at home)    Recommendations for Other Services Rehab consult;OT consult     Precautions /  Restrictions Precautions Precautions: Fall Restrictions Weight Bearing Restrictions: No    Mobility  Bed Mobility Overal bed mobility: Needs Assistance Bed Mobility: Supine to Sit     Supine to sit: Min assist     General bed mobility comments: Min assist managing Rt LE and Rt UE to EOB.  PT assists pt initially adducting Rt LE so pt could sit up on Lt side of bed; however PT transitioned to supervision assist and pt was able to move Rt LE off bed w/ tone noted in Rt LE.   Transfers Overall transfer level: Needs assistance Equipment used:  (bed rail) Transfers: Sit to/from [employer] Sit to Stand: Min assist;+2 physical assistance;+2 safety/equipment   Squat pivot transfers: Min assist;+2 safety/equipment     General transfer comment: Min assist to power up for squat pivot from bed to [facility].  Min assist to stand from recliner chair facing bed w/ pt holding onto bed rail once standing.  Cues for hand placement throughout transfer and hand over hand w/ Rt UE.  Ambulation/Gait             General Gait Details: Pre-gait stepping w/ facilitation.  Pt standing w/ Bil UEs supported on bed rail.  Cues for weight shifting to Lt and total assist facilitating step forward and back w/ Rt LE.  Tone noted in Rt LE and activation noted in toe extensors and tibialis anterior.  Cues for pt to imagine herself stepping.  Again facilitation to weight shift to Rt in order to take a step w/ the Lt LE.  Min>max assist required  to take step w/ Lt LE.  Tone noted in Rt UE holding onto bed rail during stepping activity.   Stairs            Wheelchair Mobility    Modified Rankin (Stroke Patients Only) Modified Rankin (Stroke Patients Only) Pre-Morbid Rankin Score: Moderate disability Modified Rankin: Severe disability     Balance Overall balance assessment: Needs assistance Sitting-balance support: Single extremity supported;Feet supported Sitting balance-Leahy Scale:  Fair     Standing balance support: Bilateral upper extremity supported;During functional activity Standing balance-Leahy Scale: Poor                      Cognition Arousal/Alertness: Awake/alert Behavior During Therapy: Anxious Overall Cognitive Status: No family/caregiver present to determine baseline cognitive functioning                      Exercises      General Comments General comments (skin integrity, edema, etc.): Noted that pt's stuttering seemed to improve as PT session progressed w/ pt at times not stuttering throughout an entire sentence.  Pt encouraged to imagine her Rt UE/LE moving prior to attempting to move it.  Additionally ecouraged pt to use Rt UE (even if she has to use Lt UE for total assist) for ADLs.  Per MD pt will likely not be approved for CIR.  Because amount of assist available at home upon d/c remains unknow, recommending SNF at this time.      Pertinent Vitals/Pain Pain Assessment: Faces Faces Pain Scale: Hurts little more Pain Location: head Pain Descriptors / Indicators: Headache Pain Intervention(s): Limited activity within patient's tolerance;Monitored during session    Home Living                      Prior Function            PT Goals (current goals can now be found in the care plan section) Acute Rehab PT Goals Patient Stated Goal: to get stronger PT Goal Formulation: With patient Time For Goal Achievement: 05/14/15 Potential to Achieve Goals: Fair Progress towards PT goals: Progressing toward goals    Frequency  Min 4X/week    PT Plan Current plan remains appropriate    Co-evaluation             End of Session Equipment Utilized During Treatment: Gait belt Activity Tolerance: Patient limited by fatigue;Other (comment) (anxiety) Patient left: in chair;with call bell/phone within reach     Time: 1358-1444 PT Time Calculation (min) (ACUTE ONLY): 46 min  Charges:  $Therapeutic Activity: 23-37  mins $Neuromuscular Re-education: 8-22 mins                    G Codes:      Michail Jewels PT, DPT [phone] Pager: [phone] 04/24/2015, 3:37 PM

## 2015-04-25 LAB — HEMOGLOBIN A1C
HEMOGLOBIN A1C: 5.3 % (ref 4.8–5.6)
Mean Plasma Glucose: 105 mg/dL

## 2015-04-25 MED ORDER — OXYCODONE HCL 10 MG PO TABS: 10.0000 mg | ORAL_TABLET | Freq: Four times a day (QID) | ORAL | Status: DC | PRN

## 2015-04-25 MED ORDER — TRAMADOL HCL 50 MG PO TABS: 50.0000 mg | ORAL_TABLET | Freq: Four times a day (QID) | ORAL | Status: DC | PRN

## 2015-04-25 MED ORDER — ASPIRIN 81 MG PO CHEW: 81.0000 mg | CHEWABLE_TABLET | Freq: Every day | ORAL | Status: DC

## 2015-04-25 NOTE — Progress Notes (Signed)
Family Medicine Teaching Service Daily Progress Note Intern Pager: [phone]  Patient name: Mary [name] Medical record number: [id number] Date of birth: 09/27/1975 Age: 39 y.o. Gender: female  Primary Care Provider: No primary care provider on file. Consultants: Neurology Code Status: Full  Pt Overview and Major Events to Date:  9/4: Admitted for CVA r/o and ?UTI; MRI head negative for acute infarct  Assessment and Plan: Mary [name] is a 39 y.o. female presenting with sudden onset of headache, stuttering speech, right sided weakness who was brought to ED as a code stroke. Additional symptoms at home include confusion, teeth chattering and jerky movements of arms and legs at home. Found to have elevated WBC and a possible dirty UTI although may not be a clean catch. Afebrile. Hypotension noted in ED although mother states this is her baseline. 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 CT negative for acute process. Stroke is unlike per neurology. 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 Neurology at [facility] verified this is not MS and possibly psychogenic in etiology. Neurology at [facility] thinks it is organic but etiology is unknown. Patient was sent to [facility] rheumatology who are considering lupus and put her on Plaquenil in the past few months with some improvement. Referral made to [facility] rheumatology for second opinion, appt in October. - neurology following patient >> clinically stable so will order NOTCH-3 genetic testing for further workup by outpatient team  - neuro checks q 2 hrs - continue home Plaquanil  -OT/PT consult ordered; recommending CIR >>PM&R stated pt doesn't qualify for CIR   -will discuss SNF placement with patient; SW consulted  -Case Management consulted for home health evaluation  -HgBA1C 5.3 -TSH elevated to 7.2, free T3 and T4 WNL >> recommend f/u as outpatient  -lipid panel: HDL 34, LDL 102, Cholesterol 171  -reports that steroids have helped with right sided weakness in the past; per chart review, patient has received IV Solumedrol during past hospitalizations   ?UTI: asymptomatic. UA shows large LE, negative nitrites, WBC TNTC, many bacteria and many squames  - repeat UA: moderate LE, negative nitrites, WBC 21-50, many bacteria, many squamous epithelial cells  -urine cx: NG x1 day   Leukocytosis: No clear source and UTI not well supported. WBC 16 with neutrophilia 89%. Afebrile. Denies cough, URI symptoms, SOB, diarrhea, dysuria. Patient is on Plaquenil therefore at greater risk of infection due to immune suppression. - CXR to evaluate for respiratory process: negative  - blood cultures NG x 2days -WBC 6.1 (9/5)   Hypotension: Continued to have low SBP 90s-80s. Per mother this is her baseline.  - continue to monitor   Depression/Anxiety: - home Sertraline  - home Klonopin 0.5mg  PRN   Chronic Pancreatitis: Notes of epigastric abdominal pain.  - continue home Creon   Chronic Pain/HA:  - Topamax: will increase to prior dose:  AM 50 mg PM to control pain (recently decreased to  BID in July by PM&R) - home Flexeril  BID  - home Oxycontin  q 6 hrs PRN - home Tramadol PRN - Phenergan PRN for nausea - Zofran PRN nausea  FEN/GI:  - home Protonix   - regular diet   Prophylaxis: Lovenox   Disposition: Home pending OT/PT recs  Subjective:  Received one dose of Dilaudid overnight for HA. Reporting continued headache and asking for more Dilaudid. Does not wish to go to SNF. States that her mother doesn't want her to go either and that her mom and aunt live at home with her and would be able to help with ADLs.     Objective: Temp:  [97.5 F (36.4 C)-98.3 F (36.8 C)] 97.6 F (36.4 C) (09/07 0546) Pulse Rate:  [51-68] 51 (09/07 0546) Resp:  [17-18] 17 (09/07 0546) BP: (93-116)/(50-62) 116/62 mmHg (09/07 0546) SpO2:  [99 %-100 %] 99 % (09/07 0546) Physical Exam: General: lying in bed in NAD  Cardiovascular: RRR. No murmurs appreciated.  Respiratory: CTAB, NWOB Abdomen: soft, NTND Neuro: Stuttering speech. R sided weakness UE & LE. Sensation decreased on right compared to left. CN otherwise grossly intact.  Psych: Normal affect. Slowed reactions and speech pattern.   Laboratory:  Recent Labs Lab 04/22/15 0640 04/22/15 0642 04/23/15 0408  WBC  --  16.0* 6.1  HGB 12.9 11.8* 11.2*  HCT 38.0 35.1* 34.4*  PLT  --  212 178    Recent Labs Lab 04/22/15 0640 04/22/15 0642 04/23/15 0408  NA 141 135 137  K 3.7 3.6 3.7  CL 110 109 109  CO2  --  16* 19*  BUN 10 9 10   CREATININE 0.40* 0.61 0.64  CALCIUM  --  8.7* 8.7*  PROT  --  8.2*  --   BILITOT  --  0.1*  --   ALKPHOS  --  89  --   ALT  --  14  --   AST  --  25  --   GLUCOSE 118* 117* 87     Ref Range 1d ago    Cholesterol 0 - 200 mg/dL 758   Triglycerides <832 mg/dL 549 (H)   HDL >82 mg/dL 34 (L)   Total CHOL/HDL Ratio RATIO 5.0   VLDL 0 - 40 mg/dL 35   LDL Cholesterol 0 - 99 mg/dL 641 (H)        TSH 5.830 - 4.500 uIU/mL 7.192 (H)       T3, Free 2.0 - 4.4 pg/mL 2.1          Ref Range 1d ago    Free T4 0.61 - 1.12 ng/dL 9.40            Imaging/Diagnostic Tests: Dg Chest 2 View  04/22/2015   CLINICAL DATA:  Multiple sclerosis complications. Right-sided weakness. Slurred speech.  EXAM: CHEST  2 VIEW  COMPARISON:  None.  FINDINGS: Borderline enlargement of the cardiopericardial silhouette. Low lung volumes are present, causing crowding of the pulmonary vasculature.  No airspace opacity or pleural effusion. Thoracic spondylosis noted. Old healed right rib fracture.  IMPRESSION: 1. Borderline  enlargement of the cardiopericardial silhouette, without overt edema. 2. Thoracic spondylosis.   Electronically Signed   By: Gaylyn Rong M.D.   On: 04/22/2015 15:46   Ct Head Wo Contrast  04/22/2015   CLINICAL DATA:  Code stroke, RIGHT-sided weakness. History of brain lesion.  EXAM: CT HEAD WITHOUT CONTRAST  TECHNIQUE: Contiguous axial images were obtained from the base of the skull through the vertex without intravenous contrast.  COMPARISON:  None.  FINDINGS: The ventricles and sulci are normal. No intraparenchymal hemorrhage, mass effect nor midline shift. No acute large vascular territory infarcts. Patchy white matter hypodensities are advanced for age.  No abnormal extra-axial fluid collections. Basal cisterns are patent.  No skull fracture. The included ocular  globes and orbital contents are non-suspicious. The mastoid aircells and included paranasal sinuses are well-aerated.  IMPRESSION: No acute intracranial process.  Mild to moderate white matter changes advanced for age, nonspecific though can be seen with chronic small vessel ischemic disease or demyelination  Acute findings discussed with and reconfirmed by Dr.SCOTT GOLDSTON on 04/22/2015 at 7:00 am.   Electronically Signed   By: Awilda Metro M.D.   On: 04/22/2015 06:59   Mr Brain Wo Contrast  04/22/2015   CLINICAL DATA:  Right-sided weakness  EXAM: MRI HEAD WITHOUT CONTRAST  TECHNIQUE: Multiplanar, multiecho pulse sequences of the brain and surrounding structures were obtained without intravenous contrast.  COMPARISON:  CT head 04/22/2015  FINDINGS: Negative for acute infarct.  Extensive white matter disease for age. Patchy hyperintensities are present in the subcortical and deep white matter bilaterally throughout both cerebral hemispheres with relative sparing of the periventricular white matter. Hyperintensity also noted throughout the brainstem. Sparing of the basal ganglia. No cortical infarct.  Ventricle size normal. Cerebral volume  normal. Pituitary normal in size. Craniocervical junction normal. Negative for Chiari malformation.  Negative for intracranial hemorrhage or fluid collection.  Negative for mass or edema.  Paranasal sinuses clear.  Normal orbit.  IMPRESSION: Chronic white matter changes, advanced for age. This involves the subcortical and deep white matter of both cerebral hemispheres as well as the pons. No acute infarct.  This pattern could be related to chronic microvascular ischemic change. Consider CADASIL (Cerebral autosomal dominant arteriopathy with subcortical infarcts and leukoencephalopathy . Correlate with risk factors for small vessel ischemia. Vasculitis is a consideration. Demyelinating disease is possible however the imaging pattern is not typical for multiple sclerosis.   Electronically Signed   By: Marlan Palau M.D.   On: 04/22/2015 09:16    Arvilla Market, DO 04/25/2015, 8:25 AM PGY-1, [facility] FPTS Intern pager: [phone], text pages welcome

## 2015-04-25 NOTE — Clinical Social Work Note (Signed)
CSW received referral for SNF.  Case discussed with case manager, and plan is to discharge home.  Patient and mother do not want patient to go to SNF for short term rehab per PT note.  CSW to sign off please re-consult if social work needs arise.  Mary [name]. Yadira Hada, MSW, [employer] [phone]

## 2015-04-25 NOTE — Progress Notes (Signed)
Physical Therapy Treatment Patient Details Name: Mary [name] MRN: [id number] DOB: [birth year] Today's Date: 04/25/2015    History of Present Illness Pt is a 39 y/o F admitted 2/2 onset of Rt sided weakness, headache and stuttering speech who was brought to ED as a code stroke.  MRI neg for acute infarct. CT neg for acute process. Stroke is unlikely per neurology. 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 Neurology at [facility] verified this is not MS and possibly psychogenic in etiology. Neurology at [facility] thinks it is organic but etiology is unknown. Patient was sent to [place] rheumatology who are considering lupus and put her on Plaquenil in the past few months with some improvement. Referral made to [facility] rheumatology for second opinion, appt in October.  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     PT Comments    Patient very agreeable to work with therapy today however she did not want to sit up in the recliner at the end of the session. Patient stated that she hasn't sleep well over the last couple of night and is now feeling the affects between lack of sleep and her headache. Patient was able to work on standing and standing balance this session with some pregait weight shifting activities. Patient only required Min A this session with stands. Per patient report she has 24.7 care at home and her or her mother does not want patient going to SNF for ongoing therapy. Will need to confirm with the family that she has assistance at home. Recommend HHPT if at all patient will qualify. Will continue to work on transfer and current POC  Follow Up Recommendations  Supervision for mobility/OOB;Supervision/Assistance - 24 hour;Home health PT     Equipment Recommendations  None recommended by PT (Has RW and WC at home. )    Recommendations for Other  Services       Precautions / Restrictions Precautions Precautions: Fall    Mobility  Bed Mobility         Supine to sit: Supervision     General bed mobility comments: Patient able to hook LLE under RLE and position herself to EOB without physical assist. Patient required Min A for LE back into bed. Cues for positoning  Transfers Overall transfer level: Needs assistance Equipment used:  (Use of chair backwards to pull up on while PTA sitting in chair) Transfers: Sit to/from Stand Sit to Stand: Min assist         General transfer comment: Min A to ensure balance and to facilitate hips into full extension and upright posture. Cues to tuck bottom with stand and to shift weight and attempt to get some weight bearing on R LE. Positioning R UE on chair to get some weight bearing through R UE. Patient stood x3 for activity tolerance, strengthening and repetition.   Ambulation/Gait             General Gait Details: unable to attempt   Stairs            Wheelchair Mobility    Modified Rankin (Stroke Patients Only)       Balance Overall balance assessment: Needs assistance   Sitting balance-Leahy Scale: Fair Sitting balance - Comments: Pt drifted to left side while sitting EOB     Standing balance-Leahy Scale: Poor Standing balance comment: REquired support for static standing balance. Patient stood x3 and  worked on weight shifting and attempting to pick up LEs with weight shifting.                     Cognition Arousal/Alertness: Awake/alert Behavior During Therapy: WFL for tasks assessed/performed Overall Cognitive Status: No family/caregiver present to determine baseline cognitive functioning                      Exercises      General Comments        Pertinent Vitals/Pain Pain Score: 9  Pain Location: head aches Pain Descriptors / Indicators: Headache Pain Intervention(s): Limited activity within patient's tolerance    Home  Living                      Prior Function            PT Goals (current goals can now be found in the care plan section) Progress towards PT goals: Progressing toward goals    Frequency  Min 4X/week    PT Plan Discharge plan needs to be updated    Co-evaluation             End of Session   Activity Tolerance: Patient limited by pain;Patient limited by fatigue Patient left: in bed;with call bell/phone within reach     Time: 1012-1037 PT Time Calculation (min) (ACUTE ONLY): 25 min  Charges:  $Therapeutic Activity: 23-37 mins                    G Codes:      Mary [name] 04/25/2015, 10:53 AM 04/25/2015 Mary [name] PTA  [phone] office

## 2015-04-25 NOTE — Care Management (Signed)
Dr Earlene Plater returned phone call, no need for home health RN , therefore unable to arrange home health. MD and patient aware. Ronny Flurry RN BSN [phone]

## 2015-04-25 NOTE — Progress Notes (Signed)
Marshell Garfinkel to be D/C'd Home per MD order.  Discussed with the patient and all questions fully answered.  VSS, Skin clean, dry and intact without evidence of skin break down, no evidence of skin tears noted. IV catheter discontinued intact. Site without signs and symptoms of complications. Dressing and pressure applied.  An After Visit Summary was printed and given to the patient. Patient received prescription.  D/c education completed with patient/family including follow up instructions, medication list, d/c activities limitations if indicated, with other d/c instructions as indicated by MD - patient able to verbalize understanding, all questions fully answered.   Patient instructed to return to ED, call 911, or call MD for any changes in condition.   Patient will be escorted via WC, and D/C home via private auto.  Sherene Sires 04/25/2015 4:48 PM

## 2015-04-25 NOTE — Care Management (Signed)
Received orders for home health PT/OT/aide /SLP . Patient is uninsured, and does not have an  Medicaid qualifying DX for PT/OT/aide/SLP. Paged MD [phone] and explained . If patient has a  Need for home health  for RN could set same up with [facility] , would also need order for home health social worker .   MD will discuss with team and call NCM back.  Ronny Flurry RN BSN [phone]

## 2015-04-25 NOTE — Discharge Instructions (Signed)
1. Please make follow up with your primary care doctor to be seen 1-2 weeks after discharge from hospital.  2. Take Topamax 100mg  AM and 50 mg PM to help control your headaches.  3. Work with home PT/OT for rehab.

## 2015-04-25 NOTE — Progress Notes (Signed)
OT NOTE- LATE GCODE ENTRY     2015-05-01 0700  OT G-codes **NOT FOR INPATIENT CLASS**  Functional Assessment Tool Used clinical judgement  Functional Limitation Self care  Self Care Current Status (H8299) CM  Self Care Goal Status (B7169) CJ  Self Care Discharge Status (C7893) CM        Mateo Flow   OTR/L Pager: [phone] Office: [phone] .

## 2015-04-27 LAB — CULTURE, BLOOD (ROUTINE X 2)
CULTURE: NO GROWTH
CULTURE: NO GROWTH

## 2015-05-11 ENCOUNTER — Other Ambulatory Visit: Payer: Self-pay | Admitting: Family Medicine

## 2015-05-21 ENCOUNTER — Encounter: Payer: Self-pay | Admitting: Physical Medicine & Rehabilitation

## 2015-05-21 ENCOUNTER — Encounter: Payer: Medicaid Other | Attending: Physical Medicine & Rehabilitation | Admitting: Physical Medicine & Rehabilitation

## 2015-05-21 VITALS — BP 102/64 | HR 75

## 2015-05-21 DIAGNOSIS — K861 Other chronic pancreatitis: Secondary | ICD-10-CM | POA: Insufficient documentation

## 2015-05-21 DIAGNOSIS — G8191 Hemiplegia, unspecified affecting right dominant side: Secondary | ICD-10-CM

## 2015-05-21 DIAGNOSIS — M329 Systemic lupus erythematosus, unspecified: Secondary | ICD-10-CM | POA: Diagnosis not present

## 2015-05-21 DIAGNOSIS — M542 Cervicalgia: Secondary | ICD-10-CM | POA: Diagnosis not present

## 2015-05-21 DIAGNOSIS — M545 Low back pain: Secondary | ICD-10-CM | POA: Insufficient documentation

## 2015-05-21 DIAGNOSIS — G35 Multiple sclerosis: Secondary | ICD-10-CM

## 2015-05-21 DIAGNOSIS — Z79899 Other long term (current) drug therapy: Secondary | ICD-10-CM

## 2015-05-21 DIAGNOSIS — G894 Chronic pain syndrome: Secondary | ICD-10-CM | POA: Insufficient documentation

## 2015-05-21 DIAGNOSIS — Z5181 Encounter for therapeutic drug level monitoring: Secondary | ICD-10-CM

## 2015-05-21 MED ORDER — CYCLOBENZAPRINE HCL 10 MG PO TABS: 10.0000 mg | ORAL_TABLET | Freq: Three times a day (TID) | ORAL | Status: DC | PRN

## 2015-05-21 MED ORDER — OXYCODONE HCL 10 MG PO TABS: 10.0000 mg | ORAL_TABLET | Freq: Four times a day (QID) | ORAL | Status: DC | PRN

## 2015-05-21 NOTE — Patient Instructions (Signed)
PLEASE CALL ME WITH ANY PROBLEMS OR QUESTIONS (#[phone]).  HAVE A GOOD DAY!    

## 2015-05-21 NOTE — Addendum Note (Signed)
Addended by: Doreene Eland on: 05/21/2015 02:52 PM   Modules accepted: Orders

## 2015-05-21 NOTE — Progress Notes (Signed)
Subjective:    Patient ID: Mary [name], female    DOB: [birth year], 39 y.o.   MRN: [id number]  HPI   [name] is here in regard to her chronic right sided weakness and pain syndrome. She was admitted to the hospital again with increased right HP. No new lesions were apparently seen on MRI. I did review the scan which is notable for bi-cerebral lesions--?vasculitic vs mico-infarcts  She saw her primary who placed her on oral steroids, and she seemed to respond to this again and is getting back to baseline. She has appts to see rheumatology later this month.   Lattie continues with her oxycodone for pain. Her topamax was increased to  am and  pm with improvement in her headaches to an extent.   She has had ongoing cervical pain which started about the time of this last hospitlization with radiation to her occiput. The steroids helped this as well as the topamax to a lesser degree. The area is more tender the more active she is. It tends to feel better when she rests.   Pain Inventory Average Pain 7 Pain Right Now 8 My pain is na  In the last 24 hours, has pain interfered with the following? General activity 7 Relation with others 5 Enjoyment of life 8 What TIME of day is your pain at its worst? night Sleep (in general) Poor  Pain is worse with: walking, bending and standing Pain improves with: pacing activities and medication Relief from Meds: 8  Mobility walk without assistance walk with assistance use a cane use a walker how many minutes can you walk? 10-15 ability to climb steps?  yes do you drive?  no  Function not employed: date last employed . disabled: date disabled .  Neuro/Psych weakness numbness tingling dizziness confusion depression anxiety  Prior Studies Any changes since last visit?  no  Physicians involved in your care Any changes since last visit?  no   Family History  Problem Relation Age of Onset  . Lupus Neg Hx   . Sarcoidosis  Neg Hx   . Pancreatitis Neg Hx   . Ataxia Neg Hx   . Chorea Neg Hx   . Dementia Neg Hx   . Mental retardation Neg Hx   . Migraines Neg Hx   . Multiple sclerosis Neg Hx   . Neurofibromatosis Neg Hx   . Neuropathy Neg Hx   . Parkinsonism Neg Hx   . Seizures Neg Hx   . Stroke Neg Hx   . Colitis Maternal Aunt   . Diabetes Mother   . Diabetes Father   . Diabetes      many family members   Social History   Social History  . Marital Status: Single    Spouse Name: N/A  . Number of Children: N/A  . Years of Education: N/A   Social History Main Topics  . Smoking status: Former Smoker -- 0.50 packs/day for 20 years    Types: Cigarettes    Quit date: 05/22/2013  . Smokeless tobacco: Never Used  . Alcohol Use: No  . Drug Use: No  . Sexual Activity: No   Other Topics Concern  . None   Social History Narrative   ** Merged History Encounter **       Lives permanently in [place] with mom and stepfather.  Has not started working yet and was unemployed in [place].            Past Surgical History  Procedure Laterality Date  . Abdominal surgery  ~ 2007    some sort of pancreatic cyst drainage.   . Cholecystectomy      ~ age 41-laparoscopic  . Adenoidectomy    . 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 Eus N/A 04/14/2013    Procedure: UPPER ENDOSCOPIC ULTRASOUND (EUS) LINEAR;  Surgeon: Rachael Fee, MD;  Location: [facility];  Service: Endoscopy;  Laterality: N/A;  . Abdominal surgery    . Roux-en-y gastric bypass    . Cholecystectomy     Past Medical History  Diagnosis Date  . Hernia 03-24-13    ventral hernia remains   . Pancreatitis 03-24-13    2002, 2'2013, 03-14-13  . Pancreatic cyst 2002 onset  . Depression     "recent breakup with partner of 15 yrs"  . Adrenal insufficiency (HCC) 04/23/2013    ??  . MS (multiple sclerosis) (HCC)   . Ventral hernia   . GERD (gastroesophageal reflux disease)   . Depression   .  Anxiety   . Abdominal hernia    BP 102/64 mmHg  Pulse 75  SpO2 96%  LMP 04/09/2015  Opioid Risk Score:   Fall Risk Score:  `1  Depression screen PHQ 2/9  Depression screen [facility] 2/9 05/21/2015 11/01/2014  Decreased Interest 2 2  Down, Depressed, Hopeless - 2  PHQ - 2 Score 2 4  Altered sleeping - 1  Tired, decreased energy - 1  Change in appetite - 3  Feeling bad or failure about yourself  - 0  Trouble concentrating - 1  Moving slowly or fidgety/restless - 0  PHQ-9 Score - 10     Review of Systems  Constitutional: Positive for unexpected weight change.  Neurological: Positive for dizziness, weakness and numbness.       Tingling  Psychiatric/Behavioral: Positive for confusion and dysphoric mood. The patient is nervous/anxious.   All other systems reviewed and are negative.      Objective:   Physical Exam  General: Alert and oriented x 3, No apparent distress. obese  HEENT: Head is normocephalic, atraumatic, PERRLA, EOMI, sclera anicteric, oral mucosa pink and moist, dentition intact, ext ear canals clear,  Neck: Supple without JVD or lymphadenopathy  Heart: Reg rate and rhythm. No murmurs rubs or gallops  Chest: CTA bilaterally without wheezes, rales, or rhonchi; no distress  Abdomen: Soft, non-tender, non-distended, bowel sounds positive.  Extremities: No clubbing, cyanosis, or edema. Pulses are 2+  Skin: Clean and intact without signs of breakdown  Neuro: Pt is cognitively appropriate with normal insight and awareness. Sometimes memory and attention can be limited. Cranial nerves 2-12 are intact. Sensory exam is slightly decreased on the right. Reflexes are 3+ right and 2+ left. Strength remains 4/5 rue prox to distal. 3+ to 4-/5 RLE in all muscle groups in inconsistent pattern with difficulties coordinating movement at times. Fine motor coordination is decreased RLE and RUE. She struggles with clearance of the right foot during swing phase of gait. . No tremors. .    Musculoskeletal: low back with better ROM. She has tenderness in the upper cervical spine. Pain is worst with flexion and side bending. Extension seems to relieve the pain. She has mild spasm and pain with palpation bilaterally along the upper paraspinals.   Psych: Pt's affect is appropriate. Pt is cooperative    Assessment & Plan:   1. Non-specific bicerebral/brainstem white matter disease---initially suspected to be  MS, but not characteristic in presentation. May in fact be a vasculitic process, perhaps an intracranial manifestation of SLE---apparently her labwork is reflective of an inflammatory process, however her exam and history is not as consistent. An inflammatory syndrome also could explain her repsonse to steroids.  2. Chronic pain syndrome related to above with primarily neuropathic right sided pain. She has had a recent increase in cervical pain and related headaches since her hospitalization early last month.  3. Low back pain with ? Radiculopathy RLE  4. Chronic pancreatitis    Plan:  1. Continue topamax 100mg  am and 50mg  pm for headaches and neuropathic pain.  2. Given increase in headaches and cervical symptoms on exam, I have ordered a full series with flex/ext of cervical spine. This might shed some light on the prospective SLE dx also.  3. Oxycodone was refilled. #120 10mg  4. Discussed appropriate activity levels, posture, scheduled exercise once again 5. Continue with prn AFO for gait/support as possible..  6. Pancreatitis management per PCP and gastroenterologist.  7. Rheumatology appts pending. Hopefully they will shed some light on this situation   8. Follow up with [name] in about a month with [name] or NP. of face to face patient care time were spent during this visit. All questions were encouraged and answered.

## 2015-05-25 ENCOUNTER — Other Ambulatory Visit: Payer: Self-pay | Admitting: Physical Medicine & Rehabilitation

## 2015-05-30 NOTE — Progress Notes (Signed)
Urine drug screen for this encounter is consistent for prescribed medication 

## 2015-06-19 ENCOUNTER — Encounter: Payer: Medicaid Other | Admitting: Physical Medicine & Rehabilitation

## 2015-06-29 ENCOUNTER — Encounter: Payer: Medicaid Other | Admitting: Registered Nurse

## 2015-06-29 ENCOUNTER — Encounter: Payer: Self-pay | Admitting: Registered Nurse

## 2015-06-29 ENCOUNTER — Encounter: Payer: Medicaid Other | Attending: Physical Medicine & Rehabilitation | Admitting: Registered Nurse

## 2015-06-29 VITALS — BP 132/79 | HR 84

## 2015-06-29 DIAGNOSIS — Z79899 Other long term (current) drug therapy: Secondary | ICD-10-CM | POA: Diagnosis not present

## 2015-06-29 DIAGNOSIS — G35 Multiple sclerosis: Secondary | ICD-10-CM

## 2015-06-29 DIAGNOSIS — M545 Low back pain: Secondary | ICD-10-CM | POA: Insufficient documentation

## 2015-06-29 DIAGNOSIS — Z5181 Encounter for therapeutic drug level monitoring: Secondary | ICD-10-CM | POA: Diagnosis not present

## 2015-06-29 DIAGNOSIS — K861 Other chronic pancreatitis: Secondary | ICD-10-CM | POA: Insufficient documentation

## 2015-06-29 DIAGNOSIS — G894 Chronic pain syndrome: Secondary | ICD-10-CM | POA: Insufficient documentation

## 2015-06-29 DIAGNOSIS — G8191 Hemiplegia, unspecified affecting right dominant side: Secondary | ICD-10-CM

## 2015-06-29 MED ORDER — OXYCODONE HCL 10 MG PO TABS: 10.0000 mg | ORAL_TABLET | Freq: Four times a day (QID) | ORAL | Status: DC | PRN

## 2015-06-29 MED ORDER — TRAMADOL HCL 50 MG PO TABS: 50.0000 mg | ORAL_TABLET | Freq: Two times a day (BID) | ORAL | Status: DC | PRN

## 2015-06-29 NOTE — Progress Notes (Signed)
Subjective:    Patient ID: Mary [name], female    DOB: [birth year], 39 y.o.   MRN: [id number]  HPI: [name] was a 39 year old female who returns for follow up for chronic pain and medication refill. She says her pain is located in her neck, right arm, mid-lower back radiating into right lower extremity. She rates her pain 7. Her current exercise regime is walking. [name] asked if we could resume her Tramadol , Dr. Riley [name] prescribed Tramadol in July for neuro-abdominal pain. I was in contact with Dr. Riley [name] we will prescribe  Tramadol and re-evaluate next month. She verbalizes understanding.  Pain Inventory Average Pain 6 Pain Right Now 7 My pain is sharp, stabbing, tingling and aching  In the last 24 hours, has pain interfered with the following? General activity 5 Relation with others 3 Enjoyment of life 4 What TIME of day is your pain at its worst? Morning and Evening Sleep (in general) Poor  Pain is worse with: walking, bending and standing Pain improves with: rest Relief from Meds: 7  Mobility walk with assistance use a cane use a walker ability to climb steps?  yes do you drive?  no use a wheelchair  Function disabled: date disabled Na  Neuro/Psych weakness numbness tingling trouble walking dizziness confusion depression anxiety  Prior Studies Any changes since last visit?  no  Physicians involved in your care Any changes since last visit?  no   Family History  Problem Relation Age of Onset  . Lupus Neg Hx   . Sarcoidosis Neg Hx   . Pancreatitis Neg Hx   . Ataxia Neg Hx   . Chorea Neg Hx   . Dementia Neg Hx   . Mental retardation Neg Hx   . Migraines Neg Hx   . Multiple sclerosis Neg Hx   . Neurofibromatosis Neg Hx   . Neuropathy Neg Hx   . Parkinsonism Neg Hx   . Seizures Neg Hx   . Stroke Neg Hx   . Colitis Maternal Aunt   . Diabetes Mother   . Diabetes Father   . Diabetes      many family members   Social History    Social History  . Marital Status: Single    Spouse Name: N/A  . Number of Children: N/A  . Years of Education: N/A   Social History Main Topics  . Smoking status: Former Smoker -- 0.50 packs/day for 20 years    Types: Cigarettes    Quit date: 05/22/2013  . Smokeless tobacco: Never Used  . Alcohol Use: No  . Drug Use: No  . Sexual Activity: No   Other Topics Concern  . None   Social History Narrative   ** Merged History Encounter **       Lives permanently in [place] with mom and stepfather.  Has not started working yet and was unemployed in [place].            Past Surgical History  Procedure Laterality Date  . Abdominal surgery  ~ 2007    some sort of pancreatic cyst drainage.   . Cholecystectomy      ~ age 53-laparoscopic  . Adenoidectomy    . 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 Eus N/A 04/14/2013    Procedure: UPPER ENDOSCOPIC ULTRASOUND (EUS) LINEAR;  Surgeon: Rachael Fee, MD;  Location: [facility];  Service: Endoscopy;  Laterality: N/A;  . Abdominal surgery    . Roux-en-y gastric bypass    . Cholecystectomy     Past Medical History  Diagnosis Date  . Hernia 03-24-13    ventral hernia remains   . Pancreatitis 03-24-13    2002, 2'2013, 03-14-13  . Pancreatic cyst 2002 onset  . Depression     "recent breakup with partner of 15 yrs"  . Adrenal insufficiency (HCC) 04/23/2013    ??  . MS (multiple sclerosis) (HCC)   . Ventral hernia   . GERD (gastroesophageal reflux disease)   . Depression   . Anxiety   . Abdominal hernia    BP 132/79 mmHg  Pulse 84  SpO2 100%  Opioid Risk Score:   Fall Risk Score:  `1  Depression screen PHQ 2/9  Depression screen [facility] 2/9 05/21/2015 11/01/2014  Decreased Interest 2 2  Down, Depressed, Hopeless - 2  PHQ - 2 Score 2 4  Altered sleeping - 1  Tired, decreased energy - 1  Change in appetite - 3  Feeling bad or failure about yourself  - 0  Trouble concentrating - 1   Moving slowly or fidgety/restless - 0  PHQ-9 Score - 10     Review of Systems  Constitutional: Positive for appetite change and unexpected weight change.  Neurological: Positive for dizziness, weakness and numbness.       Tingling Gait Instability  Psychiatric/Behavioral: Positive for confusion. The patient is nervous/anxious.        Depression  All other systems reviewed and are negative.      Objective:   Physical Exam  Constitutional: She is oriented to person, place, and time. She appears well-developed and well-nourished.  HENT:  Head: Normocephalic and atraumatic.  Neck: Normal range of motion. Neck supple.  Cervical Paraspinal Tenderness: C-3-C-5  Cardiovascular: Normal rate and regular rhythm.   Pulmonary/Chest: Effort normal and breath sounds normal.  Musculoskeletal:  Normal Muscle Bulk and Muscle Testing Reveals: Upper Extremities: Right: Decreased ROM 90 Degrees and Muscle Strength 4/5 Left: Full ROM and Muscle Strength 5/5 Thoracic Paraspinal Tenderness: T-10- T-12 Lower Extremities: Full ROM and Muscle Strength 5/5 Arises from chair with ease Narrow Based gait  Neurological: She is alert and oriented to person, place, and time.  Skin: Skin is warm and dry.  Psychiatric: She has a normal mood and affect.  Nursing note and vitals reviewed.         Assessment & Plan:  1. MS: [facility] Neurology Following 2. 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 #120 and Tramadol 50 mg one tablet twice a day as needed for moderate pain. 3. Low back pain with Radiculopathy RLE/: Continued topamax.  4. Chronic pancreatitis: PCP and Gastroenterologist Following  of face to face patient care time was spent during this visit. All questions were encouraged and answered.

## 2015-07-08 ENCOUNTER — Other Ambulatory Visit: Payer: Self-pay | Admitting: Physical Medicine & Rehabilitation

## 2015-07-18 ENCOUNTER — Ambulatory Visit: Payer: Medicaid Other | Admitting: Physical Medicine & Rehabilitation

## 2015-07-21 ENCOUNTER — Inpatient Hospital Stay (HOSPITAL_COMMUNITY)
Admission: EM | Admit: 2015-07-21 | Discharge: 2015-07-24 | DRG: 092 | Disposition: A | Discharge status: Home / Self Care | Payer: Medicaid Other | Attending: Internal Medicine | Admitting: Internal Medicine

## 2015-07-21 ENCOUNTER — Emergency Department (HOSPITAL_COMMUNITY): Payer: Medicaid Other

## 2015-07-21 ENCOUNTER — Encounter (HOSPITAL_COMMUNITY): Payer: Self-pay | Admitting: Emergency Medicine

## 2015-07-21 DIAGNOSIS — M329 Systemic lupus erythematosus, unspecified: Secondary | ICD-10-CM | POA: Diagnosis present

## 2015-07-21 DIAGNOSIS — K861 Other chronic pancreatitis: Secondary | ICD-10-CM | POA: Diagnosis present

## 2015-07-21 DIAGNOSIS — Z881 Allergy status to other antibiotic agents status: Secondary | ICD-10-CM

## 2015-07-21 DIAGNOSIS — Z91040 Latex allergy status: Secondary | ICD-10-CM

## 2015-07-21 DIAGNOSIS — G839 Paralytic syndrome, unspecified: Principal | ICD-10-CM | POA: Diagnosis present

## 2015-07-21 DIAGNOSIS — K219 Gastro-esophageal reflux disease without esophagitis: Secondary | ICD-10-CM | POA: Diagnosis present

## 2015-07-21 DIAGNOSIS — G459 Transient cerebral ischemic attack, unspecified: Secondary | ICD-10-CM | POA: Diagnosis present

## 2015-07-21 DIAGNOSIS — F444 Conversion disorder with motor symptom or deficit: Secondary | ICD-10-CM

## 2015-07-21 DIAGNOSIS — Z79899 Other long term (current) drug therapy: Secondary | ICD-10-CM

## 2015-07-21 DIAGNOSIS — F419 Anxiety disorder, unspecified: Secondary | ICD-10-CM | POA: Diagnosis present

## 2015-07-21 DIAGNOSIS — Z888 Allergy status to other drugs, medicaments and biological substances status: Secondary | ICD-10-CM

## 2015-07-21 DIAGNOSIS — Z7982 Long term (current) use of aspirin: Secondary | ICD-10-CM

## 2015-07-21 DIAGNOSIS — F331 Major depressive disorder, recurrent, moderate: Secondary | ICD-10-CM | POA: Diagnosis present

## 2015-07-21 DIAGNOSIS — G35 Multiple sclerosis: Secondary | ICD-10-CM | POA: Diagnosis present

## 2015-07-21 DIAGNOSIS — Z885 Allergy status to narcotic agent status: Secondary | ICD-10-CM

## 2015-07-21 DIAGNOSIS — Z880 Allergy status to penicillin: Secondary | ICD-10-CM

## 2015-07-21 DIAGNOSIS — Z79891 Long term (current) use of opiate analgesic: Secondary | ICD-10-CM

## 2015-07-21 DIAGNOSIS — Z87891 Personal history of nicotine dependence: Secondary | ICD-10-CM

## 2015-07-21 DIAGNOSIS — G441 Vascular headache, not elsewhere classified: Secondary | ICD-10-CM | POA: Diagnosis present

## 2015-07-21 DIAGNOSIS — F449 Dissociative and conversion disorder, unspecified: Secondary | ICD-10-CM | POA: Diagnosis present

## 2015-07-21 DIAGNOSIS — G43719 Chronic migraine without aura, intractable, without status migrainosus: Secondary | ICD-10-CM | POA: Insufficient documentation

## 2015-07-21 DIAGNOSIS — E785 Hyperlipidemia, unspecified: Secondary | ICD-10-CM | POA: Diagnosis present

## 2015-07-21 DIAGNOSIS — M6289 Other specified disorders of muscle: Secondary | ICD-10-CM | POA: Diagnosis not present

## 2015-07-21 DIAGNOSIS — R531 Weakness: Secondary | ICD-10-CM | POA: Diagnosis present

## 2015-07-21 LAB — CBC
HCT: 43.1 % (ref 36.0–46.0)
Hemoglobin: 14 g/dL (ref 12.0–15.0)
MCH: 29.7 pg (ref 26.0–34.0)
MCHC: 32.5 g/dL (ref 30.0–36.0)
MCV: 91.5 fL (ref 78.0–100.0)
PLATELETS: 301 10*3/uL (ref 150–400)
RBC: 4.71 MIL/uL (ref 3.87–5.11)
RDW: 13.9 % (ref 11.5–15.5)
WBC: 10 10*3/uL (ref 4.0–10.5)

## 2015-07-21 LAB — URINALYSIS, ROUTINE W REFLEX MICROSCOPIC
BILIRUBIN URINE: NEGATIVE
Glucose, UA: NEGATIVE mg/dL
HGB URINE DIPSTICK: NEGATIVE
KETONES UR: NEGATIVE mg/dL
Nitrite: NEGATIVE
PROTEIN: NEGATIVE mg/dL
Specific Gravity, Urine: 1.015 (ref 1.005–1.030)
pH: 5.5 (ref 5.0–8.0)

## 2015-07-21 LAB — I-STAT BETA HCG BLOOD, ED (MC, WL, AP ONLY)

## 2015-07-21 LAB — COMPREHENSIVE METABOLIC PANEL
ALK PHOS: 72 U/L (ref 38–126)
ALT: 11 U/L — ABNORMAL LOW (ref 14–54)
AST: 16 U/L (ref 15–41)
Albumin: 4 g/dL (ref 3.5–5.0)
Anion gap: 10 (ref 5–15)
BILIRUBIN TOTAL: 0.5 mg/dL (ref 0.3–1.2)
BUN: 9 mg/dL (ref 6–20)
CALCIUM: 9.1 mg/dL (ref 8.9–10.3)
CO2: 18 mmol/L — ABNORMAL LOW (ref 22–32)
CREATININE: 0.59 mg/dL (ref 0.44–1.00)
Chloride: 111 mmol/L (ref 101–111)
GFR calc non Af Amer: >60 mL/min (ref >60)
GLUCOSE: 103 mg/dL — AB (ref 65–99)
Potassium: 4.3 mmol/L (ref 3.5–5.1)
SODIUM: 139 mmol/L (ref 135–145)
TOTAL PROTEIN: 8 g/dL (ref 6.5–8.1)

## 2015-07-21 LAB — URINE MICROSCOPIC-ADD ON

## 2015-07-21 MED ORDER — SODIUM CHLORIDE 0.9 % IV BOLUS (SEPSIS)
1000.0000 mL | Freq: Once | INTRAVENOUS | Status: AC
  Administered 2015-07-21: 1000 mL via INTRAVENOUS

## 2015-07-21 MED ORDER — ONDANSETRON HCL 4 MG/2ML IJ SOLN
4.0000 mg | Freq: Once | INTRAMUSCULAR | Status: AC
  Administered 2015-07-21: 4 mg via INTRAVENOUS
  Filled 2015-07-21: qty 2

## 2015-07-21 MED ORDER — DIPHENHYDRAMINE HCL 50 MG/ML IJ SOLN
25.0000 mg | Freq: Once | INTRAMUSCULAR | Status: AC
  Administered 2015-07-21: 25 mg via INTRAVENOUS
  Filled 2015-07-21: qty 1

## 2015-07-21 MED ORDER — METHYLPREDNISOLONE SODIUM SUCC 125 MG IJ SOLR
125.0000 mg | Freq: Once | INTRAMUSCULAR | Status: AC
  Administered 2015-07-21: 125 mg via INTRAVENOUS
  Filled 2015-07-21: qty 2

## 2015-07-21 MED ORDER — METOCLOPRAMIDE HCL 5 MG/ML IJ SOLN
10.0000 mg | Freq: Once | INTRAMUSCULAR | Status: AC
  Administered 2015-07-21: 10 mg via INTRAVENOUS
  Filled 2015-07-21: qty 2

## 2015-07-21 MED ORDER — LORAZEPAM 2 MG/ML IJ SOLN
0.5000 mg | Freq: Once | INTRAMUSCULAR | Status: AC
  Administered 2015-07-22: 0.5 mg via INTRAVENOUS
  Filled 2015-07-21: qty 1

## 2015-07-21 MED ORDER — KETOROLAC TROMETHAMINE 30 MG/ML IJ SOLN
30.0000 mg | Freq: Once | INTRAMUSCULAR | Status: AC
  Administered 2015-07-21: 30 mg via INTRAVENOUS
  Filled 2015-07-21: qty 1

## 2015-07-21 NOTE — ED Notes (Signed)
Pt here with family c/o right sided weakness and speech problems starting after a headache on Thursday; pt with hx of similar events with what was thought to be MS

## 2015-07-21 NOTE — Consult Note (Addendum)
NEURO HOSPITALIST CONSULT NOTE   Referring physician: Dr Lita Mains Reason for Consult:HA, dizziness, right sided weakness, speech impediment  HPI:                                                                                                                                          Mary [name] is an 39 y.o. 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 I did reviewed the available clinical records from [facility] and [facility] as well as previous neurological consultations here at [facility] regarding years of episodic neurological dysfunction characterized by right sided weakness, speech impairment, and some cognitive changes. The more salient findings of such evaluations have been consistent evidence of rather extensive cortical and subcortical white matter disease on MRI brain, unimpressive MRI thoracic cord, LP negative x 2, and abnormal ANA and p-ANCA. 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 Family report that for the past couple of days she has been experiencing " a new type of HA that goes from the back of the head to the top of head" followed by dizziness, worsening g right sided weakness to the point that she can not walk, and trouble speaking. No recent fever or infection. She is on prednisone and Imuran. CT brain performed tonight was personally reviewed and showed no acute abnormality.   Past Medical History  Diagnosis Date  . Hernia 03-24-13    ventral hernia remains   . Pancreatitis 03-24-13    2002, 2'2013, 03-14-13  . Pancreatic cyst 2002 onset  . Depression     "recent breakup with partner of 15 yrs"  . Adrenal  insufficiency ([place]) 04/23/2013    ??  . MS (multiple sclerosis) ([place])   . Ventral hernia   . GERD (gastroesophageal reflux disease)   . Depression   . Anxiety   . Abdominal hernia     Past Surgical History  Procedure Laterality Date  . Abdominal surgery  ~ 2007    some sort of pancreatic cyst drainage.   . Cholecystectomy      ~ age 62-laparoscopic  . Adenoidectomy    . 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 Eus N/A 04/14/2013    Procedure: UPPER ENDOSCOPIC ULTRASOUND (EUS) LINEAR;  Surgeon: Milus Banister, MD;  Location: [facility];  Service: Endoscopy;  Laterality: N/A;  . Abdominal surgery    . Roux-en-y gastric bypass    . Cholecystectomy  Family History  Problem Relation Age of Onset  . Lupus Neg Hx   . Sarcoidosis Neg Hx   . Pancreatitis Neg Hx   . Ataxia Neg Hx   . Chorea Neg Hx   . Dementia Neg Hx   . Mental retardation Neg Hx   . Migraines Neg Hx   . Multiple sclerosis Neg Hx   . Neurofibromatosis Neg Hx   . Neuropathy Neg Hx   . Parkinsonism Neg Hx   . Seizures Neg Hx   . Stroke Neg Hx   . Colitis Maternal Aunt   . Diabetes Mother   . Diabetes Father   . Diabetes      many family members    Family History: no epilepsy, MS, or brain tumor   Social History:  reports that she quit smoking about 2 years ago. Her smoking use included Cigarettes. She has a 10 pack-year smoking history. She has never used smokeless tobacco. She reports that she does not drink alcohol or use illicit drugs.  Allergies  Allergen Reactions  . Amoxicillin Anaphylaxis  . Latex Rash  . Morphine And Related Anaphylaxis    unknown  . Penicillins Anaphylaxis  . Buprenorphine Other (See Comments)    unknown  . Latex Itching  . Morphine And Related Nausea And Vomiting  . Amoxicillin Rash  . Buprenorphine Hcl Nausea And Vomiting  . Penicillins Rash    MEDICATIONS:                                                                                                                      I have reviewed the patient's current medications.   ROS:                                                                                                                                       History obtained from family, chart review and the patient  General ROS: negative for - chills, fever, night sweats, or weight loss Psychological ROS: negative for - behavioral disorder, hallucinations,  or suicidal ideation Ophthalmic ROS: negative for - blurry vision, double vision, eye pain or loss of vision ENT ROS: negative for - epistaxis, nasal discharge, oral lesions, sore throat, tinnitus or vertigo Allergy and Immunology ROS: negative for - hives or itchy/watery eyes Hematological and Lymphatic ROS: negative for - bleeding problems, bruising or swollen lymph nodes Endocrine ROS: negative for - galactorrhea,  hair pattern changes, polydipsia/polyuria or temperature intolerance Respiratory ROS: negative for - cough, hemoptysis, shortness of breath or wheezing Cardiovascular ROS: negative for - chest pain, dyspnea on exertion, edema or irregular heartbeat Gastrointestinal ROS: negative for - abdominal pain, diarrhea, hematemesis, nausea/vomiting or stool incontinence Genito-Urinary ROS: negative for - dysuria, hematuria, incontinence or urinary frequency/urgency Musculoskeletal ROS: negative for - joint swelling Neurological ROS: as noted in HPI Dermatological ROS: negative for rash and skin lesion changes    Physical exam:  Constitutional: well developed, pleasant female in no apparent distress. Blood pressure 101/60, pulse 50, temperature 98.3 F (36.8 C), temperature source Oral, resp. rate 18, SpO2 97 %. Eyes: no jaundice or exophthalmos.  Head: normocephalic. Neck: supple, no bruits, no JVD. Cardiac: no murmurs. Lungs: clear. Abdomen: soft, no tender, no mass. Extremities: no edema, clubbing, or cyanosis.  Skin: no  rash  Neurologic Examination:                                                                                                      General: NAD Mental Status: Alert, oriented.  Stuttering speech fluent without evidence of aphasia.  Able to follow 3 step commands without difficulty. Cranial Nerves: II: Discs flat bilaterally; Visual fields grossly normal, pupils equal, round, reactive to light and accommodation III,IV, VI: ptosis not present, extra-ocular motions intact bilaterally V,VII: smile symmetric, facial light touch sensation normal bilaterally VIII: hearing normal bilaterally IX,X: uvula rises symmetrically XI: bilateral shoulder shrug XII: midline tongue extension without atrophy or fasciculations  Motor: Right sided weakness leg>>arm but not full effort given. Tone and bulk:normal tone throughout; no atrophy noted Sensory: Pinprick and light touch intact throughout, bilaterally Deep Tendon Reflexes:  Generalized hyperreflexia. Plantars: Right: downgoing   Left: downgoing Cerebellar: normal finger-to-nose, heel-to-shin no tested due to weakness Gait:  Patient js unable to walk due to weakness right leg.    Lab Results  Component Value Date/Time   CHOL 171 04/23/2015 11:07 AM    Results for orders placed or performed during the hospital encounter of 07/21/15 (from the past 48 hour(s))  I-Stat beta hCG blood, ED ([facility], [facility], [facility] only)     Status: None   Collection Time: 07/21/15  4:00 PM  Result Value Ref Range   I-stat hCG, quantitative <5.0 <5 mIU/mL   Comment 3            Comment:   GEST. AGE      CONC.  (mIU/mL)   <=1 WEEK        5 - 50     2 WEEKS       50 - 500     3 WEEKS       100 - 10,000     4 WEEKS     1,000 - 30,000        FEMALE AND NON-PREGNANT FEMALE:     LESS THAN 5 mIU/mL   Comprehensive metabolic panel     Status: Abnormal   Collection Time: 07/21/15  4:03 PM  Result Value Ref Range   Sodium 139 135 -  145 mmol/L   Potassium 4.3 3.5 - 5.1 mmol/L    Chloride 111 101 - 111 mmol/L   CO2 18 (L) 22 - 32 mmol/L   Glucose, Bld 103 (H) 65 - 99 mg/dL   BUN 9 6 - 20 mg/dL   Creatinine, Ser 0.59 0.44 - 1.00 mg/dL   Calcium 9.1 8.9 - 10.3 mg/dL   Total Protein 8.0 6.5 - 8.1 g/dL   Albumin 4.0 3.5 - 5.0 g/dL   AST 16 15 - 41 U/L   ALT 11 (L) 14 - 54 U/L   Alkaline Phosphatase 72 38 - 126 U/L   Total Bilirubin 0.5 0.3 - 1.2 mg/dL   GFR calc non Af Amer >60 >60 mL/min   GFR calc Af Amer >60 >60 mL/min    Comment: (NOTE) The eGFR has been calculated using the CKD EPI equation. This calculation has not been validated in all clinical situations. eGFR's persistently <60 mL/min signify possible Chronic Kidney Disease.    Anion gap 10 5 - 15  CBC     Status: None   Collection Time: 07/21/15  4:03 PM  Result Value Ref Range   WBC 10.0 4.0 - 10.5 K/uL   RBC 4.71 3.87 - 5.11 MIL/uL   Hemoglobin 14.0 12.0 - 15.0 g/dL   HCT 43.1 36.0 - 46.0 %   MCV 91.5 78.0 - 100.0 fL   MCH 29.7 26.0 - 34.0 pg   MCHC 32.5 30.0 - 36.0 g/dL   RDW 13.9 11.5 - 15.5 %   Platelets 301 150 - 400 K/uL  Urinalysis, Routine w reflex microscopic (not at [facility])     Status: Abnormal   Collection Time: 07/21/15  6:20 PM  Result Value Ref Range   Color, Urine YELLOW YELLOW   APPearance CLOUDY (A) CLEAR   Specific Gravity, Urine 1.015 1.005 - 1.030   pH 5.5 5.0 - 8.0   Glucose, UA NEGATIVE NEGATIVE mg/dL   Hgb urine dipstick NEGATIVE NEGATIVE   Bilirubin Urine NEGATIVE NEGATIVE   Ketones, ur NEGATIVE NEGATIVE mg/dL   Protein, ur NEGATIVE NEGATIVE mg/dL   Nitrite NEGATIVE NEGATIVE   Leukocytes, UA TRACE (A) NEGATIVE  Urine microscopic-add on     Status: Abnormal   Collection Time: 07/21/15  6:20 PM  Result Value Ref Range   Squamous Epithelial / LPF 0-5 (A) NONE SEEN   WBC, UA 0-5 0 - 5 WBC/hpf   RBC / HPF 0-5 0 - 5 RBC/hpf   Bacteria, UA MANY (A) NONE SEEN   Crystals HIPPURIC ACID CRYSTALS (A) NEGATIVE    Ct Head Wo Contrast  07/21/2015  CLINICAL DATA:   Headaches since Thursday with nausea. Right-sided weakness. EXAM: CT HEAD WITHOUT CONTRAST TECHNIQUE: Contiguous axial images were obtained from the base of the skull through the vertex without intravenous contrast. COMPARISON:  MRI brain 04/22/2015. FINDINGS: Extensive diffuse subcortical white matter hypoattenuation is similar to the prior exams. No acute cortical infarct is present. The basal ganglia are intact. Insular ribbon is normal bilaterally. The ventricles are of normal size. No significant extra-axial fluid collection is present. No acute hemorrhage or mass lesion is present. The paranasal sinuses and mastoid air cells are clear. The calvarium is intact. Globes and orbits are within normal limits. No focal extracranial soft tissue lesions are present. IMPRESSION: 1. No acute intracranial abnormality or significant interval change. 2. Stable diffuse subcortical white matter disease. Electronically Signed   By: San Morelle M.D.   On: 07/21/2015 19:14  Assessment/Plan: 39 y/o with chronic episodic neurologic dysfunction characterized by HA, right sided weakness and speech impediment. She had had comprehensive neurological and rheumatological evaluation without a definitive diagnosis, although the possibility of an autoimmune disorder with CNS involvement versus CADASIL has been entertained. She was also diagnosed with a " functional neurological disorder" She now presents with " a new type of HA" and worsening right sided weakness that prevent her from walking (she is not offering full effort on muscle testing). Recommend admission to medicine, as she is unable to walk to this time. MRI brain with and without contrast and MRA to address the possibility of worsening white matter involvement or arterial involvement with a vasculitic like pattern. Continue Imuran and prednisone. Check if genetic test for CADASIL ordered while in the ED on 9/16 is available. PT. Will follow up.   Dorian Pod, MD 07/21/2015, 10:26 PM  [facility]

## 2015-07-21 NOTE — ED Provider Notes (Signed)
CSN: [id number]     Arrival date & time 07/21/15  1455 History   First MD Initiated Contact with Patient 07/21/15 1650     Chief Complaint  Patient presents with  . Weakness     (Consider location/radiation/quality/duration/timing/severity/associated sxs/prior Treatment) HPI Patient presents with right-sided upper and lower extremity weakness with word finding difficulty since Thursday evening. Patient has history of similar episodes. Patient's stepmother states she is then worked up by neurology who referred the patient to rheumatology for further testing. Unknown cause for the patient's symptoms. Patient states she had a frontal headache the morning of the onset of symptoms. Patient was recently admitted to the hospital for similar presentation. She had a normal CT brain and no acute findings on her MRI. Past Medical History  Diagnosis Date  . Hernia 03-24-13    ventral hernia remains   . Pancreatitis 03-24-13    2002, 2'2013, 03-14-13  . Pancreatic cyst 2002 onset  . Depression     "recent breakup with partner of 15 yrs"  . Adrenal insufficiency ([facility]) 04/23/2013    ??  . MS (multiple sclerosis) ([facility])   . Ventral hernia   . GERD (gastroesophageal reflux disease)   . Depression   . Anxiety   . Abdominal hernia    Past Surgical History  Procedure Laterality Date  . Abdominal surgery  ~ 2007    some sort of pancreatic cyst drainage.   . Cholecystectomy      ~ age 33-laparoscopic  . Adenoidectomy    . 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 Eus N/A 04/14/2013    Procedure: UPPER ENDOSCOPIC ULTRASOUND (EUS) LINEAR;  Surgeon: Rachael Fee, MD;  Location: [facility];  Service: Endoscopy;  Laterality: N/A;  . Abdominal surgery    . Roux-en-y gastric bypass    . Cholecystectomy     Family History  Problem Relation Age of Onset  . Lupus Neg Hx   . Sarcoidosis Neg Hx   . Pancreatitis Neg Hx   . Ataxia Neg Hx   . Chorea Neg Hx    . Dementia Neg Hx   . Mental retardation Neg Hx   . Migraines Neg Hx   . Multiple sclerosis Neg Hx   . Neurofibromatosis Neg Hx   . Neuropathy Neg Hx   . Parkinsonism Neg Hx   . Seizures Neg Hx   . Stroke Neg Hx   . Colitis Maternal Aunt   . Diabetes Mother   . Diabetes Father   . Diabetes      many family members   Social History  Substance Use Topics  . Smoking status: Former Smoker -- 0.50 packs/day for 20 years    Types: Cigarettes    Quit date: 05/22/2013  . Smokeless tobacco: Never Used  . Alcohol Use: No   OB History    Gravida Para Term Preterm AB TAB SAB Ectopic Multiple Living   0 0 0 0 0 0 0 0       Review of Systems  Constitutional: Negative for fever and chills.  Eyes: Negative for visual disturbance.  Respiratory: Negative for shortness of breath.   Cardiovascular: Negative for chest pain.  Gastrointestinal: Positive for nausea and abdominal pain. Negative for vomiting, diarrhea, blood in stool, abdominal distention and anal bleeding.  Genitourinary: Negative for dysuria, frequency, hematuria and flank pain.  Musculoskeletal: Positive for back pain and gait problem. Negative for myalgias, joint  swelling, arthralgias, neck pain and neck stiffness.  Skin: Negative for rash and wound.  Neurological: Positive for speech difficulty, weakness, numbness and headaches. Negative for dizziness, tremors, seizures, syncope and light-headedness.  All other systems reviewed and are negative.     Allergies  Amoxicillin; Latex; Morphine and related; Penicillins; Buprenorphine; Latex; Morphine and related; Amoxicillin; Buprenorphine hcl; and Penicillins  Home Medications   Prior to Admission medications   Medication Sig Start Date End Date Taking? Authorizing Provider  aspirin 81 MG chewable tablet Chew 1 tablet (81 mg total) by mouth daily. 04/25/15  Yes Arvilla Market, DO  azaTHIOprine (IMURAN) 50 MG tablet Take 50 mg by mouth 2 (two) times daily.   Yes  Historical Provider, MD  clonazePAM (KLONOPIN) 0.5 MG tablet Take 0.5 mg by mouth 2 (two) times daily as needed for anxiety.   Yes Historical Provider, MD  cyclobenzaprine (FLEXERIL) 10 MG tablet Take 1 tablet (10 mg total) by mouth 3 (three) times daily as needed for muscle spasms. 05/21/15  Yes Ranelle Oyster, MD  hydroxychloroquine (PLAQUENIL) 200 MG tablet Take 300 mg by mouth daily.    Yes Historical Provider, MD  hydrOXYzine (ATARAX/VISTARIL) 50 MG tablet Take 50 mg by mouth 3 (three) times daily as needed for anxiety or itching.   Yes Historical Provider, MD  lipase/protease/amylase (CREON-12/PANCREASE) 12000 UNITS CPEP capsule Take 2 capsules by mouth 3 (three) times daily with meals. 07/31/13  Yes Catarina Hartshorn, MD  Oxycodone HCl 10 MG TABS Take 1 tablet (10 mg total) by mouth every 6 (six) hours as needed (severe pain). 06/29/15  Yes Jones Bales, NP  predniSONE (DELTASONE) 10 MG tablet Take 20 mg by mouth daily with breakfast.   Yes Historical Provider, MD  promethazine (PHENERGAN) 25 MG tablet TAKE 1 TABLET BY MOUTH EVERY 6 HOURS AS NEEDED FOR NAUSEA/VOMITING 02/16/15  Yes Jessica D Zehr, PA-C  sertraline (ZOLOFT) 100 MG tablet Take 150 mg by mouth daily.    Yes Historical Provider, MD  topiramate (TOPAMAX) 100 MG tablet Take 50 mg by mouth 2 (two) times daily.   Yes Historical Provider, MD  traMADol (ULTRAM) 50 MG tablet Take 1 tablet (50 mg total) by mouth 2 (two) times daily as needed for moderate pain. 06/29/15  Yes Jones Bales, NP   BP 101/58 mmHg  Pulse 59  Temp(Src) 98.3 F (36.8 C) (Oral)  Resp 14  SpO2 97% Physical Exam  Constitutional: She is oriented to person, place, and time. She appears well-developed and well-nourished. No distress.  HENT:  Head: Normocephalic and atraumatic.  Mouth/Throat: Oropharynx is clear and moist. No oropharyngeal exudate.  Eyes: EOM are normal. Pupils are equal, round, and reactive to light.  Neck: Normal range of motion. Neck supple.   No meningismus. No midline cervical tenderness to palpation.  Cardiovascular: Normal rate and regular rhythm.  Exam reveals no gallop and no friction rub.   No murmur heard. Pulmonary/Chest: Effort normal and breath sounds normal. No respiratory distress. She has no wheezes. She has no rales. She exhibits no tenderness.  Abdominal: Soft. Bowel sounds are normal. She exhibits no distension and no mass. There is tenderness (mild epigastric tenderness). There is no rebound and no guarding.  Musculoskeletal: Normal range of motion. She exhibits tenderness (diffuse midline lumbar tenderness. No CVA tenderness.). She exhibits no edema.  No lower extremity swelling or pain. Distal pulses 2+ and symmetric  Neurological: She is alert and oriented to person, place, and time.  Patient with  inconsistent neurologic exam. Appears to be dependent on effort. Speech at times is garbled and then will be clear. Patient is drawing up the right side of her upper lip. No definite facial weakness. Patient has no drift bilateral upper extremities though the patient holds the right arm at a lower position that the left arm. Patient with decreased grip strength on the right compared to the left. No sensory changes of the bilateral upper extremities. Patient will not lift the right lower extremity off the bed. She will wiggle her toes with encouragement. She states she has decreased sensation to the right lower extremity compared to the left. Left lower extremity with no drift and normal sensation. Patient has normal finger to nose testing on the left.  Skin: Skin is warm and dry. No rash noted. No erythema.  Nursing note and vitals reviewed.   ED Course  Procedures (including critical care time) Labs Review Labs Reviewed  COMPREHENSIVE METABOLIC PANEL - Abnormal; Notable for the following:    CO2 18 (*)    Glucose, Bld 103 (*)    ALT 11 (*)    All other components within normal limits  URINALYSIS, ROUTINE W REFLEX  MICROSCOPIC (NOT AT [facility]) - Abnormal; Notable for the following:    APPearance CLOUDY (*)    Leukocytes, UA TRACE (*)    All other components within normal limits  URINE MICROSCOPIC-ADD ON - Abnormal; Notable for the following:    Squamous Epithelial / LPF 0-5 (*)    Bacteria, UA MANY (*)    Crystals HIPPURIC ACID CRYSTALS (*)    All other components within normal limits  CBC  I-STAT BETA HCG BLOOD, ED ([facility], [facility], [facility] ONLY)    Imaging Review Ct Head Wo Contrast  07/21/2015  CLINICAL DATA:  Headaches since Thursday with nausea. Right-sided weakness. EXAM: CT HEAD WITHOUT CONTRAST TECHNIQUE: Contiguous axial images were obtained from the base of the skull through the vertex without intravenous contrast. COMPARISON:  MRI brain 04/22/2015. FINDINGS: Extensive diffuse subcortical white matter hypoattenuation is similar to the prior exams. No acute cortical infarct is present. The basal ganglia are intact. Insular ribbon is normal bilaterally. The ventricles are of normal size. No significant extra-axial fluid collection is present. No acute hemorrhage or mass lesion is present. The paranasal sinuses and mastoid air cells are clear. The calvarium is intact. Globes and orbits are within normal limits. No focal extracranial soft tissue lesions are present. IMPRESSION: 1. No acute intracranial abnormality or significant interval change. 2. Stable diffuse subcortical white matter disease. Electronically Signed   By: Marin Roberts M.D.   On: 07/21/2015 19:14   I have personally reviewed and evaluated these images and lab results as part of my medical decision-making.   EKG Interpretation None      MDM   Final diagnoses:  Right sided weakness  Right sided weakness    Patient with right-sided weakness but inconsistent exam. Multiple episodes of similar symptoms. Currently being followed by neurology and rheumatology.  Patient's weakness has improved though still present. States she is still  having a headache despite medication. Vital signs remained stable. CT with no acute findings. Discussed with Dr. Cyril Mourning. Will evaluate the patient in emergency department to help determine disposition.  Discussed with Dr. Lovell Sheehan. Will admit to observation telemetry bed.  Loren Racer, MD 07/22/15 [phone]

## 2015-07-21 NOTE — ED Notes (Signed)
Hx panceatitis

## 2015-07-21 NOTE — ED Notes (Signed)
Having difficulty voiding at present.

## 2015-07-21 NOTE — ED Notes (Signed)
These symptoms have been going for years intermittently.  Her swallowing is getting worse which goes along with all these other symptoms

## 2015-07-21 NOTE — ED Notes (Addendum)
Note for reversal  Lt sided face droop  Rt arm and leg not moving. Rt not left

## 2015-07-21 NOTE — ED Notes (Addendum)
The pt is being worked up for multiple symptoms since July.  She has had a headache since Thursday.  Since Thursday   She has had an incrfeasing headache that will not go away.  She usually walks talks and she take care of herself.  Lt face droop  Vomited once Thursday  Still nauseated. Lt leg and lt leg has less movement and her speech is getting more difficult..  All sy,ptoms getting worse everyday.  Correction  That is less movement in her RIGHT ARM AND RT LEG  NOT THE LEFT AS  DOCUMENTED.

## 2015-07-22 ENCOUNTER — Observation Stay (HOSPITAL_COMMUNITY): Payer: Medicaid Other

## 2015-07-22 DIAGNOSIS — M329 Systemic lupus erythematosus, unspecified: Secondary | ICD-10-CM | POA: Diagnosis present

## 2015-07-22 DIAGNOSIS — Z87891 Personal history of nicotine dependence: Secondary | ICD-10-CM | POA: Diagnosis not present

## 2015-07-22 DIAGNOSIS — F419 Anxiety disorder, unspecified: Secondary | ICD-10-CM | POA: Diagnosis present

## 2015-07-22 DIAGNOSIS — K861 Other chronic pancreatitis: Secondary | ICD-10-CM | POA: Diagnosis present

## 2015-07-22 DIAGNOSIS — Z79899 Other long term (current) drug therapy: Secondary | ICD-10-CM | POA: Diagnosis not present

## 2015-07-22 DIAGNOSIS — E785 Hyperlipidemia, unspecified: Secondary | ICD-10-CM | POA: Diagnosis present

## 2015-07-22 DIAGNOSIS — G43719 Chronic migraine without aura, intractable, without status migrainosus: Secondary | ICD-10-CM | POA: Insufficient documentation

## 2015-07-22 DIAGNOSIS — F449 Dissociative and conversion disorder, unspecified: Secondary | ICD-10-CM | POA: Diagnosis present

## 2015-07-22 DIAGNOSIS — G459 Transient cerebral ischemic attack, unspecified: Secondary | ICD-10-CM | POA: Diagnosis not present

## 2015-07-22 DIAGNOSIS — F444 Conversion disorder with motor symptom or deficit: Secondary | ICD-10-CM | POA: Diagnosis not present

## 2015-07-22 DIAGNOSIS — M6289 Other specified disorders of muscle: Secondary | ICD-10-CM | POA: Diagnosis not present

## 2015-07-22 DIAGNOSIS — G441 Vascular headache, not elsewhere classified: Secondary | ICD-10-CM | POA: Diagnosis present

## 2015-07-22 DIAGNOSIS — Z881 Allergy status to other antibiotic agents status: Secondary | ICD-10-CM | POA: Diagnosis not present

## 2015-07-22 DIAGNOSIS — IMO0002 Reserved for concepts with insufficient information to code with codable children: Secondary | ICD-10-CM | POA: Insufficient documentation

## 2015-07-22 DIAGNOSIS — Z885 Allergy status to narcotic agent status: Secondary | ICD-10-CM | POA: Diagnosis not present

## 2015-07-22 DIAGNOSIS — Z888 Allergy status to other drugs, medicaments and biological substances status: Secondary | ICD-10-CM | POA: Diagnosis not present

## 2015-07-22 DIAGNOSIS — F332 Major depressive disorder, recurrent severe without psychotic features: Secondary | ICD-10-CM | POA: Diagnosis not present

## 2015-07-22 DIAGNOSIS — K219 Gastro-esophageal reflux disease without esophagitis: Secondary | ICD-10-CM | POA: Diagnosis present

## 2015-07-22 DIAGNOSIS — Z79891 Long term (current) use of opiate analgesic: Secondary | ICD-10-CM | POA: Diagnosis not present

## 2015-07-22 DIAGNOSIS — F331 Major depressive disorder, recurrent, moderate: Secondary | ICD-10-CM | POA: Diagnosis present

## 2015-07-22 DIAGNOSIS — Z91040 Latex allergy status: Secondary | ICD-10-CM | POA: Diagnosis not present

## 2015-07-22 DIAGNOSIS — G839 Paralytic syndrome, unspecified: Secondary | ICD-10-CM | POA: Diagnosis not present

## 2015-07-22 DIAGNOSIS — G35 Multiple sclerosis: Secondary | ICD-10-CM | POA: Diagnosis present

## 2015-07-22 DIAGNOSIS — Z880 Allergy status to penicillin: Secondary | ICD-10-CM | POA: Diagnosis not present

## 2015-07-22 DIAGNOSIS — Z7982 Long term (current) use of aspirin: Secondary | ICD-10-CM | POA: Diagnosis not present

## 2015-07-22 LAB — LIPID PANEL
CHOL/HDL RATIO: 5.3 ratio
Cholesterol: 239 mg/dL — ABNORMAL HIGH (ref 0–200)
HDL: 45 mg/dL (ref >40)
LDL Cholesterol: 168 mg/dL — ABNORMAL HIGH (ref 0–99)
Triglycerides: 131 mg/dL (ref <150)
VLDL: 26 mg/dL (ref 0–40)

## 2015-07-22 MED ORDER — SENNOSIDES-DOCUSATE SODIUM 8.6-50 MG PO TABS
1.0000 | ORAL_TABLET | Freq: Every evening | ORAL | Status: DC | PRN
  Administered 2015-07-24: 1 via ORAL
  Filled 2015-07-22: qty 1

## 2015-07-22 MED ORDER — BACLOFEN 10 MG PO TABS
5.0000 mg | ORAL_TABLET | Freq: Three times a day (TID) | ORAL | Status: DC
  Administered 2015-07-22 – 2015-07-24 (×5): 5 mg via ORAL
  Filled 2015-07-22 (×5): qty 1

## 2015-07-22 MED ORDER — CYCLOBENZAPRINE HCL 10 MG PO TABS: 10.0000 mg | ORAL_TABLET | Freq: Three times a day (TID) | ORAL | Status: DC | PRN

## 2015-07-22 MED ORDER — CLONAZEPAM 0.5 MG PO TABS
0.5000 mg | ORAL_TABLET | Freq: Once | ORAL | Status: AC
  Administered 2015-07-22: 0.5 mg via ORAL
  Filled 2015-07-22: qty 1

## 2015-07-22 MED ORDER — HYDROXYZINE HCL 25 MG PO TABS: 50.0000 mg | ORAL_TABLET | Freq: Three times a day (TID) | ORAL | Status: DC | PRN

## 2015-07-22 MED ORDER — STROKE: EARLY STAGES OF RECOVERY BOOK
Freq: Once | Status: AC
  Administered 2015-07-22: 04:00:00
  Filled 2015-07-22: qty 1

## 2015-07-22 MED ORDER — SODIUM CHLORIDE 0.9 % IV SOLN
INTRAVENOUS | Status: DC
  Administered 2015-07-22 – 2015-07-24 (×4): via INTRAVENOUS

## 2015-07-22 MED ORDER — TOPIRAMATE 25 MG PO TABS
50.0000 mg | ORAL_TABLET | Freq: Two times a day (BID) | ORAL | Status: DC
  Administered 2015-07-22 – 2015-07-23 (×4): 50 mg via ORAL
  Filled 2015-07-22 (×4): qty 2

## 2015-07-22 MED ORDER — AZATHIOPRINE 50 MG PO TABS
50.0000 mg | ORAL_TABLET | Freq: Two times a day (BID) | ORAL | Status: DC
  Administered 2015-07-22 – 2015-07-24 (×6): 50 mg via ORAL
  Filled 2015-07-22 (×8): qty 1

## 2015-07-22 MED ORDER — ASPIRIN 81 MG PO CHEW
81.0000 mg | CHEWABLE_TABLET | Freq: Every day | ORAL | Status: DC
  Administered 2015-07-22 – 2015-07-24 (×3): 81 mg via ORAL
  Filled 2015-07-22 (×3): qty 1

## 2015-07-22 MED ORDER — HYDROMORPHONE HCL 1 MG/ML IJ SOLN
0.5000 mg | INTRAMUSCULAR | Status: DC | PRN
  Administered 2015-07-22 (×2): 0.5 mg via INTRAVENOUS
  Filled 2015-07-22 (×2): qty 1

## 2015-07-22 MED ORDER — METHYLPREDNISOLONE SODIUM SUCC 125 MG IJ SOLR
125.0000 mg | Freq: Once | INTRAMUSCULAR | Status: AC
  Administered 2015-07-22: 125 mg via INTRAVENOUS
  Filled 2015-07-22: qty 2

## 2015-07-22 MED ORDER — PANCRELIPASE (LIP-PROT-AMYL) 12000-38000 UNITS PO CPEP
2.0000 | ORAL_CAPSULE | Freq: Three times a day (TID) | ORAL | Status: DC
  Administered 2015-07-22 – 2015-07-24 (×7): 24000 [IU] via ORAL
  Filled 2015-07-22 (×7): qty 2

## 2015-07-22 MED ORDER — CLONAZEPAM 0.5 MG PO TABS
0.5000 mg | ORAL_TABLET | Freq: Two times a day (BID) | ORAL | Status: DC | PRN
  Administered 2015-07-22: 0.5 mg via ORAL
  Filled 2015-07-22: qty 1

## 2015-07-22 MED ORDER — VALPROATE SODIUM 500 MG/5ML IV SOLN
125.0000 mg | Freq: Four times a day (QID) | INTRAVENOUS | Status: DC
  Administered 2015-07-22 – 2015-07-23 (×2): 125 mg via INTRAVENOUS
  Filled 2015-07-22 (×4): qty 1.25

## 2015-07-22 MED ORDER — HYDROXYCHLOROQUINE SULFATE 200 MG PO TABS
300.0000 mg | ORAL_TABLET | Freq: Every day | ORAL | Status: DC
  Administered 2015-07-22 – 2015-07-24 (×3): 300 mg via ORAL
  Filled 2015-07-22 (×3): qty 2

## 2015-07-22 MED ORDER — ENOXAPARIN SODIUM 40 MG/0.4ML ~~LOC~~ SOLN
40.0000 mg | SUBCUTANEOUS | Status: DC
  Administered 2015-07-22 – 2015-07-24 (×3): 40 mg via SUBCUTANEOUS
  Filled 2015-07-22 (×3): qty 0.4

## 2015-07-22 MED ORDER — GADOBENATE DIMEGLUMINE 529 MG/ML IV SOLN
20.0000 mL | Freq: Once | INTRAVENOUS | Status: AC | PRN
  Administered 2015-07-22: 19 mL via INTRAVENOUS

## 2015-07-22 MED ORDER — SERTRALINE HCL 50 MG PO TABS
150.0000 mg | ORAL_TABLET | Freq: Every day | ORAL | Status: DC
  Administered 2015-07-22 – 2015-07-24 (×3): 150 mg via ORAL
  Filled 2015-07-22 (×3): qty 1

## 2015-07-22 MED ORDER — OXYCODONE HCL 5 MG PO TABS
10.0000 mg | ORAL_TABLET | Freq: Four times a day (QID) | ORAL | Status: DC | PRN
  Administered 2015-07-22 – 2015-07-24 (×2): 10 mg via ORAL
  Filled 2015-07-22 (×2): qty 2

## 2015-07-22 MED ORDER — HYDROMORPHONE HCL 1 MG/ML IJ SOLN
0.5000 mg | INTRAMUSCULAR | Status: DC | PRN
  Administered 2015-07-22 – 2015-07-24 (×11): 0.5 mg via INTRAVENOUS
  Filled 2015-07-22 (×11): qty 1

## 2015-07-22 NOTE — Progress Notes (Signed)
Utilization Review Completed.Mary [name] T12/11/2014  

## 2015-07-22 NOTE — ED Notes (Signed)
Pt states she is a vegetarian

## 2015-07-22 NOTE — ED Notes (Signed)
Attempted report 

## 2015-07-22 NOTE — Progress Notes (Signed)
Subjective:  39 year old female patient significant psychiatric comorbidity, admitted with right upper and lower extremity weakness. She had history of abnormal brain MRIs has been construed as an MS suspect with serial monitoring with imaging by her regular outpatient neurologist. She has not been given a diagnosis of MS and has not been on any disease modifying therapy.  An MRI of the brain without contrast was performed during this admission, showed extensive demyelinating lesions supratentorially in bilateral cerebral hemispheres in including in the temporal lobe white matter which can be seen in my office process. But no active or enhancing lesions were seen.  Patient reports having severe daily headaches for the past several weeks which have been intractable. She takes opioids to help with musculoskeletal pain symptoms. She has severe photophobia, dizziness and nausea associated with the headaches.  Current facility-administered medications:  .  0.9 %  sodium chloride infusion, , Intravenous, Continuous, Ripudeep K Rai, MD, Last Rate: 100 mL/hr at 07/22/15 2035 .  aspirin chewable tablet 81 mg, 81 mg, Oral, Daily, Ron Parker, MD, 81 mg at 07/22/15 0947 .  azaTHIOprine (IMURAN) tablet 50 mg, 50 mg, Oral, BID, Ron Parker, MD, 50 mg at 07/22/15 2135 .  baclofen (LIORESAL) tablet 5 mg, 5 mg, Oral, TID, Andy Moye Daniel Nones, MD, 5 mg at 07/22/15 2033 .  clonazePAM (KLONOPIN) tablet 0.5 mg, 0.5 mg, Oral, BID PRN, Ron Parker, MD, 0.5 mg at 07/22/15 2135 .  cyclobenzaprine (FLEXERIL) tablet 10 mg, 10 mg, Oral, TID PRN, Ron Parker, MD .  enoxaparin (LOVENOX) injection 40 mg, 40 mg, Subcutaneous, Q24H, Ron Parker, MD, 40 mg at 07/22/15 0947 .  HYDROmorphone (DILAUDID) injection 0.5 mg, 0.5 mg, Intravenous, Q3H PRN, Ripudeep K Rai, MD, 0.5 mg at 07/22/15 2005 .  hydroxychloroquine (PLAQUENIL) tablet 300 mg, 300 mg, Oral, Daily, Ron Parker, MD, 300  mg at 07/22/15 0947 .  hydrOXYzine (ATARAX/VISTARIL) tablet 50 mg, 50 mg, Oral, TID PRN, Ron Parker, MD .  lipase/protease/amylase (CREON) capsule 24,000 Units, 2 capsule, Oral, TID WC, Ron Parker, MD, 24,000 Units at 07/22/15 1700 .  oxyCODONE (Oxy IR/ROXICODONE) immediate release tablet 10 mg, 10 mg, Oral, Q6H PRN, Ripudeep K Rai, MD, 10 mg at 07/22/15 0947 .  senna-docusate (Senokot-S) tablet 1 tablet, 1 tablet, Oral, QHS PRN, Ron Parker, MD .  sertraline (ZOLOFT) tablet 150 mg, 150 mg, Oral, Daily, Ron Parker, MD, 150 mg at 07/22/15 0948 .  topiramate (TOPAMAX) tablet 50 mg, 50 mg, Oral, BID, Ron Parker, MD, 50 mg at 07/22/15 2135 .  valproate (DEPACON) 125 mg in dextrose 5 % 50 mL IVPB, 125 mg, Intravenous, 4 times per day, Kristelle Cavallaro Daniel Nones, MD, 125 mg at 07/22/15 2135  Objective: Current vital signs: BP 96/56 mmHg  Pulse 74  Temp(Src) 98 F (36.7 C) (Oral)  Resp 16  Ht  (1.6 m)  Wt 98.657 kg (217 lb 8 oz)  BMI 38.54 kg/m2  SpO2 98% Vital signs in last 24 hours: Temp:  [97.7 F (36.5 C)-98 F (36.7 C)] 98 F (36.7 C) (12/04 2200) Pulse Rate:  [65-107] 74 (12/04 2200) Resp:  [14-18] 16 (12/04 2200) BP: (88-115)/(44-78) 96/56 mmHg (12/04 2200) SpO2:  [95 %-100 %] 98 % (12/04 2200) Weight:  [98.657 kg (217 lb 8 oz)] 98.657 kg (217 lb 8 oz) (12/04 0302)  Intake/Output from previous day: 12/03 0701 - 12/04 0700 In: 1000 [I.V.:1000] Out: -  Nutritional status: Diet vegetarian Room service appropriate?: Yes; Fluid consistency:: Thin  Neurologic Exam: Alert, oriented 4. She is in moderate distress from her headache and has photophobia. Fluent speech with no aphasia or dysarthria. Cranial nerve exam-2-12 grossly intact Motor examination, she was not cooperative with very poor effort during examination of the right upper and lower extremity is. She is able to demonstrate sustained antigravity strength, with full motor  strength in left upper and lower extremities. Sensory examination is not reliable. Normal finger nose testing with the left upper extremity.    Lab Results: Basic Metabolic Panel:  Recent Labs Lab 07/21/15 1603  NA 139  K 4.3  CL 111  CO2 18*  GLUCOSE 103*  BUN 9  CREATININE 0.59  CALCIUM 9.1    Liver Function Tests:  Recent Labs Lab 07/21/15 1603  AST 16  ALT 11*  ALKPHOS 72  BILITOT 0.5  PROT 8.0  ALBUMIN 4.0   No results for input(s): LIPASE, AMYLASE in the last 168 hours. No results for input(s): AMMONIA in the last 168 hours.  CBC:  Recent Labs Lab 07/21/15 1603  WBC 10.0  HGB 14.0  HCT 43.1  MCV 91.5  PLT 301    Cardiac Enzymes: No results for input(s): CKTOTAL, CKMB, CKMBINDEX, TROPONINI in the last 168 hours.  Lipid Panel:  Recent Labs Lab 07/22/15 0551  CHOL 239*  TRIG 131  HDL 45  CHOLHDL 5.3  VLDL 26  LDLCALC 161*    CBG: No results for input(s): GLUCAP in the last 168 hours.  Microbiology: Results for orders placed or performed during the hospital encounter of 04/22/15  Urine culture     Status: None   Collection Time: 04/22/15 10:45 AM  Result Value Ref Range Status   Specimen Description URINE, CATHETERIZED  Final   Special Requests NONE  Final   Culture NO GROWTH 1 DAY  Final   Report Status 04/23/2015 FINAL  Final  Culture, blood (routine x 2)     Status: None   Collection Time: 04/22/15  3:00 PM  Result Value Ref Range Status   Specimen Description BLOOD LEFT HAND  Final   Special Requests BOTTLES DRAWN AEROBIC ONLY 3CC POF ON ROCEPHIN  Final   Culture NO GROWTH 5 DAYS  Final   Report Status 04/27/2015 FINAL  Final  Culture, blood (routine x 2)     Status: None   Collection Time: 04/22/15  3:15 PM  Result Value Ref Range Status   Specimen Description BLOOD RIGHT HAND  Final   Special Requests BOTTLES DRAWN AEROBIC ONLY 5CC  POF ON ROCEPHIN  Final   Culture NO GROWTH 5 DAYS  Final   Report Status 04/27/2015 FINAL   Final    Coagulation Studies: No results for input(s): LABPROT, INR in the last 72 hours.  Imaging: Ct Head Wo Contrast  07/21/2015  CLINICAL DATA:  Headaches since Thursday with nausea. Right-sided weakness. EXAM: CT HEAD WITHOUT CONTRAST TECHNIQUE: Contiguous axial images were obtained from the base of the skull through the vertex without intravenous contrast. COMPARISON:  MRI brain 04/22/2015. FINDINGS: Extensive diffuse subcortical white matter hypoattenuation is similar to the prior exams. No acute cortical infarct is present. The basal ganglia are intact. Insular ribbon is normal bilaterally. The ventricles are of normal size. No significant extra-axial fluid collection is present. No acute hemorrhage or mass lesion is present. The paranasal sinuses and mastoid air cells are clear. The calvarium is intact. Globes and orbits are within normal  limits. No focal extracranial soft tissue lesions are present. IMPRESSION: 1. No acute intracranial abnormality or significant interval change. 2. Stable diffuse subcortical white matter disease. Electronically Signed   By: Marin Roberts M.D.   On: 07/21/2015 19:14   Mr Maxine Glenn Head Wo Contrast  07/22/2015  CLINICAL DATA:  Autoimmune disorder versus CADASIL. Few day history of atypical headache followed by dizziness and RIGHT-sided weakness, difficulty speaking. History of depression, pancreatitis, multiple sclerosis. EXAM: MRI HEAD WITHOUT AND WITH CONTRAST MRA HEAD WITHOUT CONTRAST TECHNIQUE: Multiplanar, multiecho pulse sequences of the brain and surrounding structures were obtained without and with intravenous contrast. Angiographic images of the head were obtained using MRA technique without contrast. CONTRAST:  19mL MULTIHANCE GADOBENATE DIMEGLUMINE 529 MG/ML IV SOLN COMPARISON:  MRI of the head April 22, 2015 and CT head July 21, 2015 FINDINGS: MRI HEAD FINDINGS Multiple sequences are mildly or moderately motion degraded. The ventricles and sulci  are normal for patient's age. Patchy to confluent supratentorial and pontine white matter T2 hyperintensities are unchanged, including RIGHT and possibly LEFT temporal lobe involvement though limited assessment due to patient motion. Lesions show low T1 signal suggesting black holes of demyelination. No mass lesions, mass effect. No abnormal parenchymal enhancement. No reduced diffusion to suggest acute ischemia or hyperacute demyelination. No susceptibility artifact to suggest hemorrhage. No abnormal extra-axial fluid collections. No extra-axial masses nor leptomeningeal enhancement. Normal major intracranial vascular flow voids seen at the skull base. Ocular globes and orbital contents are unremarkable though not tailored for evaluation. No suspicious calvarial bone marrow signal. No abnormal sellar expansion. Craniocervical junction maintained. Visualized paranasal sinuses and mastoid air cells are well-aerated. MRA HEAD FINDINGS Anterior circulation: Normal flow related enhancement of the included cervical, petrous, cavernous and supraclinoid internal carotid arteries. Patent anterior communicating artery. Normal flow related enhancement of the anterior and middle cerebral arteries, including distal segments. No large vessel occlusion, high-grade stenosis, abnormal luminal irregularity, aneurysm. Posterior circulation: Codominant vertebral artery's. Basilar artery is patent, with normal flow related enhancement of the main branch vessels. Normal flow related enhancement of the posterior cerebral arteries. Small bilateral posterior communicating arteries present. No large vessel occlusion, high-grade stenosis, abnormal luminal irregularity, aneurysm. IMPRESSION: MRI HEAD: No acute intracranial process. Moderate to severe stable white matter lesions, with imaging characteristics of demyelination though not classic for multiple sclerosis. Findings could represent CADASIL though, typically demonstrates greater  anterior temporal pole involvement. MRA HEAD: Normal. Electronically Signed   By: Awilda Metro M.D.   On: 07/22/2015 02:35   Mr Laqueta Jean ZO Contrast  07/22/2015  CLINICAL DATA:  Autoimmune disorder versus CADASIL. Few day history of atypical headache followed by dizziness and RIGHT-sided weakness, difficulty speaking. History of depression, pancreatitis, multiple sclerosis. EXAM: MRI HEAD WITHOUT AND WITH CONTRAST MRA HEAD WITHOUT CONTRAST TECHNIQUE: Multiplanar, multiecho pulse sequences of the brain and surrounding structures were obtained without and with intravenous contrast. Angiographic images of the head were obtained using MRA technique without contrast. CONTRAST:  19mL MULTIHANCE GADOBENATE DIMEGLUMINE 529 MG/ML IV SOLN COMPARISON:  MRI of the head April 22, 2015 and CT head July 21, 2015 FINDINGS: MRI HEAD FINDINGS Multiple sequences are mildly or moderately motion degraded. The ventricles and sulci are normal for patient's age. Patchy to confluent supratentorial and pontine white matter T2 hyperintensities are unchanged, including RIGHT and possibly LEFT temporal lobe involvement though limited assessment due to patient motion. Lesions show low T1 signal suggesting black holes of demyelination. No mass lesions, mass effect. No abnormal parenchymal enhancement.  No reduced diffusion to suggest acute ischemia or hyperacute demyelination. No susceptibility artifact to suggest hemorrhage. No abnormal extra-axial fluid collections. No extra-axial masses nor leptomeningeal enhancement. Normal major intracranial vascular flow voids seen at the skull base. Ocular globes and orbital contents are unremarkable though not tailored for evaluation. No suspicious calvarial bone marrow signal. No abnormal sellar expansion. Craniocervical junction maintained. Visualized paranasal sinuses and mastoid air cells are well-aerated. MRA HEAD FINDINGS Anterior circulation: Normal flow related enhancement of the  included cervical, petrous, cavernous and supraclinoid internal carotid arteries. Patent anterior communicating artery. Normal flow related enhancement of the anterior and middle cerebral arteries, including distal segments. No large vessel occlusion, high-grade stenosis, abnormal luminal irregularity, aneurysm. Posterior circulation: Codominant vertebral artery's. Basilar artery is patent, with normal flow related enhancement of the main branch vessels. Normal flow related enhancement of the posterior cerebral arteries. Small bilateral posterior communicating arteries present. No large vessel occlusion, high-grade stenosis, abnormal luminal irregularity, aneurysm. IMPRESSION: MRI HEAD: No acute intracranial process. Moderate to severe stable white matter lesions, with imaging characteristics of demyelination though not classic for multiple sclerosis. Findings could represent CADASIL though, typically demonstrates greater anterior temporal pole involvement. MRA HEAD: Normal. Electronically Signed   By: Awilda Metro M.D.   On: 07/22/2015 02:35      Assessment/Plan:  39 year old female patient presented with right upper and lower extremity weakness symptoms, intractable daily migraines as described. Her neurological examination does have significant psychogenic component, with poor effort and cooperation with the right sided motor examination. MRI of the brain that showed extensive demyelinating lesions which are all chronic with no evidence of any acute enhancing lesions which could account for an acute right-sided weakness. Patient previously had multiple cervical spine MRI studies to evaluate for MS, most recent was from June 2015 as per EMR review. At this point, suspicion for her right-sided weakness being pathological secondary to an active MS lesion or an acute stroke is very low given the negative brain MRI study. 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  She does have intractable  chronic migraines with daily headaches, associated with photophobia and dizziness. Recommend starting IV Depacon 125 mg every 6 hours to help with the headaches, in addition to baclofen 5 mg 3 times a day for the neck muscle spasms. If the headache is improved with IV Depacon, can be switched to by mouth Depakote 250 mg twice a day and continued long-term for migraine prophylaxis. Neurology service will continue to follow up during her hospitalization. Please call for any further questions.

## 2015-07-22 NOTE — H&P (Signed)
[facility] Admission History and Physical       Mary [name] ZOX:[id number] DOB: [birth year] DOA: 07/21/2015    Referring physician: EDP PCP: [name], NP  Specialists:   Chief Complaint: Headache and Right Sided Weakness  HPI: Mary [name] is a 39 y.o. female with recent diagnosis of SLE like Syndrome, followed by Rheumatology at [facility] who presents tot he ED with complaints of severe sharp persistent headache in the back of her head with associated right sided weakness x 3 days. She has also had stuttering and hesitant  speech and anomia which her Step-Mother at bedside reports that she has during exacerbations.   A TIA workup was initiated, and a head ct was performed and negative for acute findings, and she was seen in the ED by Neurology Dr. Leroy Kennedy.  She was administered IV Solumedrol and reports that her symptoms feel as if they are improving.     Review of Systems: Constitutional: No Weight Loss, No Weight Gain, Night Sweats, Fevers, Chills, Dizziness, Light Headedness, Fatigue, or Generalized Weakness HEENT:   +Headaches, Difficulty Swallowing,Tooth/Dental Problems,Sore Throat,  No Sneezing, Rhinitis, Ear Ache, Nasal Congestion, or Post Nasal Drip,  Cardio-vascular:  No Chest pain, Orthopnea, PND, Edema in Lower Extremities, Anasarca, Dizziness, Palpitations  Resp: No Dyspnea, No DOE, No Productive Cough, No Non-Productive Cough, No Hemoptysis, No Wheezing.    GI: No Heartburn, Indigestion, Abdominal Pain, Nausea, Vomiting, Diarrhea, Constipation, Hematemesis, Hematochezia, Melena, Change in Bowel Habits,  Loss of Appetite  GU: No Dysuria, No Change in Color of Urine, No Urgency or Urinary Frequency, No Flank pain.  Musculoskeletal:  No Joint Pain or Swelling, No Decreased Range of Motion, No Back Pain.  Neurologic: No Syncope, No Seizures, +Right Sided Weakness, Paresthesia, Vision Disturbance or Loss, No Diplopia, No Vertigo, +Hesitant speech, No  Difficulty Walking,  Skin: No Rash or Lesions. Psych: No Change in Mood or Affect, No Depression or Anxiety, No Memory loss, No Confusion, or Hallucinations   Past Medical History  Diagnosis Date  . Hernia 03-24-13    ventral hernia remains   . Pancreatitis 03-24-13    2002, 2'2013, 03-14-13  . Pancreatic cyst 2002 onset  . Depression     "recent breakup with partner of 15 yrs"  . Adrenal insufficiency (HCC) 04/23/2013    ??  . MS (multiple sclerosis) (HCC)   . Ventral hernia   . GERD (gastroesophageal reflux disease)   . Depression   . Anxiety   . Abdominal hernia      Past Surgical History  Procedure Laterality Date  . Abdominal surgery  ~ 2007    some sort of pancreatic cyst drainage.   . Cholecystectomy      ~ age 11-laparoscopic  . Adenoidectomy    . 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 Eus N/A 04/14/2013    Procedure: UPPER ENDOSCOPIC ULTRASOUND (EUS) LINEAR;  Surgeon: Rachael Fee, MD;  Location: [facility];  Service: Endoscopy;  Laterality: N/A;  . Abdominal surgery    . Roux-en-y gastric bypass    . Cholecystectomy        Prior to Admission medications   Medication Sig Start Date End Date Taking? Authorizing Provider  aspirin 81 MG chewable tablet Chew 1 tablet (81 mg total) by mouth daily. 04/25/15  Yes Arvilla Market, DO  azaTHIOprine (IMURAN) 50 MG tablet Take 50 mg by mouth 2 (two)  times daily.   Yes Historical Provider, MD  clonazePAM (KLONOPIN) 0.5 MG tablet Take 0.5 mg by mouth 2 (two) times daily as needed for anxiety.   Yes Historical Provider, MD  cyclobenzaprine (FLEXERIL) 10 MG tablet Take 1 tablet (10 mg total) by mouth 3 (three) times daily as needed for muscle spasms. 05/21/15  Yes Ranelle Oyster, MD  hydroxychloroquine (PLAQUENIL) 200 MG tablet Take 300 mg by mouth daily.    Yes Historical Provider, MD  hydrOXYzine (ATARAX/VISTARIL) 50 MG tablet Take 50 mg by mouth 3 (three) times  daily as needed for anxiety or itching.   Yes Historical Provider, MD  lipase/protease/amylase (CREON-12/PANCREASE) 12000 UNITS CPEP capsule Take 2 capsules by mouth 3 (three) times daily with meals. 07/31/13  Yes Catarina Hartshorn, MD  Oxycodone HCl 10 MG TABS Take 1 tablet (10 mg total) by mouth every 6 (six) hours as needed (severe pain). 06/29/15  Yes Jones Bales, NP  predniSONE (DELTASONE) 10 MG tablet Take 20 mg by mouth daily with breakfast.   Yes Historical Provider, MD  promethazine (PHENERGAN) 25 MG tablet TAKE 1 TABLET BY MOUTH EVERY 6 HOURS AS NEEDED FOR NAUSEA/VOMITING 02/16/15  Yes Jessica D Zehr, PA-C  sertraline (ZOLOFT) 100 MG tablet Take 150 mg by mouth daily.    Yes Historical Provider, MD  topiramate (TOPAMAX) 100 MG tablet Take 50 mg by mouth 2 (two) times daily.   Yes Historical Provider, MD  traMADol (ULTRAM) 50 MG tablet Take 1 tablet (50 mg total) by mouth 2 (two) times daily as needed for moderate pain. 06/29/15  Yes Jones Bales, NP     Allergies  Allergen Reactions  . Amoxicillin Anaphylaxis  . Latex Rash  . Morphine And Related Anaphylaxis    unknown  . Penicillins Anaphylaxis  . Buprenorphine Other (See Comments)    unknown  . Latex Itching  . Morphine And Related Nausea And Vomiting  . Amoxicillin Rash  . Buprenorphine Hcl Nausea And Vomiting  . Penicillins Rash    Social History:  reports that she quit smoking about 2 years ago. Her smoking use included Cigarettes. She has a 10 pack-year smoking history. She has never used smokeless tobacco. She reports that she does not drink alcohol or use illicit drugs.    Family History  Problem Relation Age of Onset  . Lupus Neg Hx   . Sarcoidosis Neg Hx   . Pancreatitis Neg Hx   . Ataxia Neg Hx   . Chorea Neg Hx   . Dementia Neg Hx   . Mental retardation Neg Hx   . Migraines Neg Hx   . Multiple sclerosis Neg Hx   . Neurofibromatosis Neg Hx   . Neuropathy Neg Hx   . Parkinsonism Neg Hx   . Seizures Neg Hx     . Stroke Neg Hx   . Colitis Maternal Aunt   . Diabetes Mother   . Diabetes Father   . Diabetes      many family members       Physical Exam:  GEN:  Pleasant  39 y.o. female examined and in no acute distress; cooperative with exam Filed Vitals:   07/21/15 2145 07/21/15 2200 07/21/15 2230 07/21/15 2300  BP: 92/55 101/60 100/66 101/58  Pulse:  50 55 59  Temp:      TempSrc:      Resp:   18 14  SpO2:  97% 96% 97%   Blood pressure 101/58, pulse 59, temperature 98.3 F (36.8  C), temperature source Oral, resp. rate 14, SpO2 97 %. PSYCH: SHe is alert and oriented x4; does not appear anxious does not appear depressed; affect is normal HEENT: Normocephalic and Atraumatic, Mucous membranes pink; PERRLA; EOM intact; Fundi:  Benign;  No scleral icterus, Nares: Patent, Oropharynx: Clear, Edentulous or Fair Dentition,    Neck:  FROM, No Cervical Lymphadenopathy nor Thyromegaly or Carotid Bruit; No JVD; Breasts:: Not examined CHEST WALL: No tenderness CHEST: Normal respiration, clear to auscultation bilaterally HEART: Regular rate and rhythm; no murmurs rubs or gallops BACK: No kyphosis or scoliosis; No CVA tenderness ABDOMEN: Positive Bowel Sounds, Scaphoid, Obese, Soft Non-Tender, No Rebound or Guarding; No Masses, No Organomegaly, No Pannus; No Intertriginous candida. Rectal Exam: Not done EXTREMITIES: No Bone or Joint Deformity; Age-Appropriate Arthropathy of the Hands and knees; No Cyanosis, Clubbing, or Edema; No Ulcerations. Genitalia: not examined PULSES: 2+ and symmetric SKIN: Normal hydration no rash or ulceration  CNS:  Alert and Oriented x 4, + right sided Weakness RLE> RLE Mental Status:  Alert, Oriented, Thought Content Appropriate. Speech Fluent without evidence of Aphasia. Able to follow 3 step commands without difficulty.  In No obvious pain.   Cranial Nerves:  II: Discs flat bilaterally; Visual fields Intact, Pupils equal and reactive.    III,IV, VI: Extra-ocular motions  intact bilaterally    V,VII: smile symmetric, facial light touch sensation normal bilaterally    VIII: hearing intact or decreasesd bilaterally    IX,X: gag reflex present    XI: bilateral shoulder shrug    XII: midline tongue extension   Motor:  Right:  Upper extremity 4/5     Left:  Upper extremity 5/5     Right:  Lower extremity 3/5    Left:  Lower extremity 5/5     Tone and Bulk:  normal tone throughout; no atrophy noted   Sensory:  Pinprick and light touch intact throughout, bilaterally   Deep Tendon Reflexes: 2+ and symmetric throughout   Plantars/ Babinski:  Right: equivocal Left: normal    Cerebellar:  Finger to nose with difficulty with right hand.   Gait: deferred    Vascular: pulses palpable throughout    Labs on Admission:  Basic Metabolic Panel:  Recent Labs Lab 07/21/15 1603  NA 139  K 4.3  CL 111  CO2 18*  GLUCOSE 103*  BUN 9  CREATININE 0.59  CALCIUM 9.1   Liver Function Tests:  Recent Labs Lab 07/21/15 1603  AST 16  ALT 11*  ALKPHOS 72  BILITOT 0.5  PROT 8.0  ALBUMIN 4.0   No results for input(s): LIPASE, AMYLASE in the last 168 hours. No results for input(s): AMMONIA in the last 168 hours. CBC:  Recent Labs Lab 07/21/15 1603  WBC 10.0  HGB 14.0  HCT 43.1  MCV 91.5  PLT 301   Cardiac Enzymes: No results for input(s): CKTOTAL, CKMB, CKMBINDEX, TROPONINI in the last 168 hours.  BNP (last 3 results) No results for input(s): BNP in the last 8760 hours.  ProBNP (last 3 results) No results for input(s): PROBNP in the last 8760 hours.  CBG: No results for input(s): GLUCAP in the last 168 hours.  Radiological Exams on Admission: Ct Head Wo Contrast  07/21/2015  CLINICAL DATA:  Headaches since Thursday with nausea. Right-sided weakness. EXAM: CT HEAD WITHOUT CONTRAST TECHNIQUE: Contiguous axial images were obtained from the base of the skull through the vertex without intravenous contrast. COMPARISON:  MRI brain 04/22/2015.  FINDINGS: Extensive diffuse subcortical white  matter hypoattenuation is similar to the prior exams. No acute cortical infarct is present. The basal ganglia are intact. Insular ribbon is normal bilaterally. The ventricles are of normal size. No significant extra-axial fluid collection is present. No acute hemorrhage or mass lesion is present. The paranasal sinuses and mastoid air cells are clear. The calvarium is intact. Globes and orbits are within normal limits. No focal extracranial soft tissue lesions are present. IMPRESSION: 1. No acute intracranial abnormality or significant interval change. 2. Stable diffuse subcortical white matter disease. Electronically Signed   By: Marin Roberts M.D.   On: 07/21/2015 19:14     EKG: Independently reviewed. Sinus Bradycardia rate =54 No Acute Changes    Assessment/Plan:     38 y.o. female with  Principal Problem:   1.     Right sided weakness- due to TIA versus Atypical Migraine Headache Syndrome, or Vascular Headache or Vasculitis   TIA Workup   Neuro Checks   IV Solumedrol administered   Neurology Following  Active Problems:   2.    TIA (transient ischemic attack)-    TIA workup     3.     Vascular headache   Migraine Headache Protocol     4.     Chronic pancreatitis with chronic pseudocysts   stable    5.     Systemic lupus erythematosus related syndrome (HCC)   Request medical records    6.     DVT Prophylaxis   Lovenox    7.     GI Prophylaxis   IV Protonix     Code Status:     FULL CODE        Family Communication:   Family at Bedside   Disposition Plan:  Observation Status        Time spent:  22 Minutes      Ron Parker [facility] Pager [phone]   If 7AM -7PM Please Contact the Day Rounding Team MD for [facility]  If 7PM-7AM, Please Contact Night-Floor Coverage  [email or web address] Password [email or web address] 07/22/2015, 12:37 AM     ADDENDUM:   Patient was seen and examined on 07/22/2015

## 2015-07-22 NOTE — Progress Notes (Signed)
[facility]                                                                              Patient Demographics  Mary [name], is a 39 y.o. female, DOB - 27-Dec-1975, EBR:[id number]  Admit date - 07/21/2015   Admitting Physician Mary Parker, MD  Outpatient Primary MD for the patient is Mary Hansen, NP  LOS - 0   Chief Complaint  Patient presents with  . Weakness       Brief HPI   Mary [name] is a 39 y.o. female with recent diagnosis of SLE like Syndrome, followed by Rheumatology at [facility] who presented to ED with complaints of severe sharp persistent headache in the back of her head with associated right sided weakness x 3 days. She has also had stuttering speech and some cognitive changes. Patient was seen by neurology and recommended admission for further workup. CT head was negative for acute findings. Patient was also given IV Solu-Medrol.     Assessment & Plan    Principal Problem:   Right sided weakness, slurred speech and cognitive deficits - Unclear etiology, neurology consulted, no definitive diagnosis at this time, possibility of an autoimmune disorder with CNS involvement versus CADASIL versus functional - MRI brain negative for acute stroke, moderate to severe stable white matter lesions with imaging characteristics of demyelination though not classic for multiple sclerosis, could represent CADASIL - MRA normal - PTOT evaluation, continue IV Solu-Medrol, neurochecks  Active Problems:  Vascular headache - Neurology following, continue migraine headache protocol - Patient also on chronic narcotics, had to restart to avoid withdrawals and prior multiple requests from patient    Chronic pancreatitis with chronic pseudocysts - Continue Creon    Systemic lupus erythematosus related syndrome (HCC) - Continue Imuran, prednisone   Code Status: Full code  Family Communication: Discussed in detail with the patient, all imaging  results, lab results explained to the patient    Disposition Plan:  Time Spent in minutes  25 minutes  Procedures  MRI, MRA brain  Consults   Neurology  DVT Prophylaxis  Lovenox   Medications  Scheduled Meds: . aspirin  81 mg Oral Daily  . azaTHIOprine  50 mg Oral BID  . enoxaparin (LOVENOX) injection  40 mg Subcutaneous Q24H  . hydroxychloroquine  300 mg Oral Daily  . lipase/protease/amylase  2 capsule Oral TID WC  . sertraline  150 mg Oral Daily  . topiramate  50 mg Oral BID   Continuous Infusions: . sodium chloride 100 mL/hr at 07/22/15 1037   PRN Meds:.clonazePAM, cyclobenzaprine, HYDROmorphone (DILAUDID) injection, hydrOXYzine, oxyCODONE, senna-docusate   Antibiotics   Anti-infectives    Start     Dose/Rate Route Frequency Ordered Stop   07/22/15 1000  hydroxychloroquine (PLAQUENIL) tablet 300 mg     300 mg Oral Daily 07/22/15 0314          Subjective:   Mary [name] was seen and examined today. Still with stuttering speech, right-sided weakness, headache 6/10. Denies dizziness, chest pain, shortness of breath, abdominal pain, N/V/D/C. No acute events overnight.    Objective:   Blood pressure  102/61, pulse 98, temperature 97.7 F (36.5 C), temperature source Oral, resp. rate 16, height  (1.6 m), weight 98.657 kg (217 lb 8 oz), SpO2 99 %.  Wt Readings from Last 3 Encounters:  07/22/15 98.657 kg (217 lb 8 oz)  04/22/15 91.173 kg (201 lb)  11/07/14 97.07 kg (214 lb)     Intake/Output Summary (Last 24 hours) at 07/22/15 1317 Last data filed at 07/21/15 2212  Gross per 24 hour  Intake   1000 ml  Output      0 ml  Net   1000 ml    Exam  General: Alert and oriented x 3, NAD, stuttering speech  HEENT:  PERRLA, EOMI, Anicteric Sclera, mucous membranes moist.   Neck: Supple, no JVD, no masses  CVS: S1 S2 auscultated, no rubs, murmurs or gallops. Regular rate and rhythm.  Respiratory: Clear to auscultation bilaterally, no wheezing, rales or  rhonchi  Abdomen: Soft, nontender, nondistended, + bowel sounds  Ext: no cyanosis clubbing or edema  Neuro: AAOx3, Cr N's II- XII. right-sided weakness leg> arm  Skin: No rashes  Psych: Normal affect and demeanor, alert and oriented x3    Data Review   Micro Results No results found for this or any previous visit (from the past 240 hour(s)).  Radiology Reports Ct Head Wo Contrast  07/21/2015  CLINICAL DATA:  Headaches since Thursday with nausea. Right-sided weakness. EXAM: CT HEAD WITHOUT CONTRAST TECHNIQUE: Contiguous axial images were obtained from the base of the skull through the vertex without intravenous contrast. COMPARISON:  MRI brain 04/22/2015. FINDINGS: Extensive diffuse subcortical white matter hypoattenuation is similar to the prior exams. No acute cortical infarct is present. The basal ganglia are intact. Insular ribbon is normal bilaterally. The ventricles are of normal size. No significant extra-axial fluid collection is present. No acute hemorrhage or mass lesion is present. The paranasal sinuses and mastoid air cells are clear. The calvarium is intact. Globes and orbits are within normal limits. No focal extracranial soft tissue lesions are present. IMPRESSION: 1. No acute intracranial abnormality or significant interval change. 2. Stable diffuse subcortical white matter disease. Electronically Signed   By: Mary [name] M.D.   On: 07/21/2015 19:14   Mr Maxine Glenn Head Wo Contrast  07/22/2015  CLINICAL DATA:  Autoimmune disorder versus CADASIL. Few day history of atypical headache followed by dizziness and RIGHT-sided weakness, difficulty speaking. History of depression, pancreatitis, multiple sclerosis. EXAM: MRI HEAD WITHOUT AND WITH CONTRAST MRA HEAD WITHOUT CONTRAST TECHNIQUE: Multiplanar, multiecho pulse sequences of the brain and surrounding structures were obtained without and with intravenous contrast. Angiographic images of the head were obtained using MRA technique  without contrast. CONTRAST:  19mL MULTIHANCE GADOBENATE DIMEGLUMINE 529 MG/ML IV SOLN COMPARISON:  MRI of the head April 22, 2015 and CT head July 21, 2015 FINDINGS: MRI HEAD FINDINGS Multiple sequences are mildly or moderately motion degraded. The ventricles and sulci are normal for patient's age. Patchy to confluent supratentorial and pontine white matter T2 hyperintensities are unchanged, including RIGHT and possibly LEFT temporal lobe involvement though limited assessment due to patient motion. Lesions show low T1 signal suggesting black holes of demyelination. No mass lesions, mass effect. No abnormal parenchymal enhancement. No reduced diffusion to suggest acute ischemia or hyperacute demyelination. No susceptibility artifact to suggest hemorrhage. No abnormal extra-axial fluid collections. No extra-axial masses nor leptomeningeal enhancement. Normal major intracranial vascular flow voids seen at the skull base. Ocular globes and orbital contents are unremarkable though not tailored for evaluation. No  suspicious calvarial bone marrow signal. No abnormal sellar expansion. Craniocervical junction maintained. Visualized paranasal sinuses and mastoid air cells are well-aerated. MRA HEAD FINDINGS Anterior circulation: Normal flow related enhancement of the included cervical, petrous, cavernous and supraclinoid internal carotid arteries. Patent anterior communicating artery. Normal flow related enhancement of the anterior and middle cerebral arteries, including distal segments. No large vessel occlusion, high-grade stenosis, abnormal luminal irregularity, aneurysm. Posterior circulation: Codominant vertebral artery's. Basilar artery is patent, with normal flow related enhancement of the main branch vessels. Normal flow related enhancement of the posterior cerebral arteries. Small bilateral posterior communicating arteries present. No large vessel occlusion, high-grade stenosis, abnormal luminal irregularity,  aneurysm. IMPRESSION: MRI HEAD: No acute intracranial process. Moderate to severe stable white matter lesions, with imaging characteristics of demyelination though not classic for multiple sclerosis. Findings could represent CADASIL though, typically demonstrates greater anterior temporal pole involvement. MRA HEAD: Normal. Electronically Signed   By: Awilda Metro M.D.   On: 07/22/2015 02:35   Mr Laqueta Jean WU Contrast  07/22/2015  CLINICAL DATA:  Autoimmune disorder versus CADASIL. Few day history of atypical headache followed by dizziness and RIGHT-sided weakness, difficulty speaking. History of depression, pancreatitis, multiple sclerosis. EXAM: MRI HEAD WITHOUT AND WITH CONTRAST MRA HEAD WITHOUT CONTRAST TECHNIQUE: Multiplanar, multiecho pulse sequences of the brain and surrounding structures were obtained without and with intravenous contrast. Angiographic images of the head were obtained using MRA technique without contrast. CONTRAST:  19mL MULTIHANCE GADOBENATE DIMEGLUMINE 529 MG/ML IV SOLN COMPARISON:  MRI of the head April 22, 2015 and CT head July 21, 2015 FINDINGS: MRI HEAD FINDINGS Multiple sequences are mildly or moderately motion degraded. The ventricles and sulci are normal for patient's age. Patchy to confluent supratentorial and pontine white matter T2 hyperintensities are unchanged, including RIGHT and possibly LEFT temporal lobe involvement though limited assessment due to patient motion. Lesions show low T1 signal suggesting black holes of demyelination. No mass lesions, mass effect. No abnormal parenchymal enhancement. No reduced diffusion to suggest acute ischemia or hyperacute demyelination. No susceptibility artifact to suggest hemorrhage. No abnormal extra-axial fluid collections. No extra-axial masses nor leptomeningeal enhancement. Normal major intracranial vascular flow voids seen at the skull base. Ocular globes and orbital contents are unremarkable though not tailored for  evaluation. No suspicious calvarial bone marrow signal. No abnormal sellar expansion. Craniocervical junction maintained. Visualized paranasal sinuses and mastoid air cells are well-aerated. MRA HEAD FINDINGS Anterior circulation: Normal flow related enhancement of the included cervical, petrous, cavernous and supraclinoid internal carotid arteries. Patent anterior communicating artery. Normal flow related enhancement of the anterior and middle cerebral arteries, including distal segments. No large vessel occlusion, high-grade stenosis, abnormal luminal irregularity, aneurysm. Posterior circulation: Codominant vertebral artery's. Basilar artery is patent, with normal flow related enhancement of the main branch vessels. Normal flow related enhancement of the posterior cerebral arteries. Small bilateral posterior communicating arteries present. No large vessel occlusion, high-grade stenosis, abnormal luminal irregularity, aneurysm. IMPRESSION: MRI HEAD: No acute intracranial process. Moderate to severe stable white matter lesions, with imaging characteristics of demyelination though not classic for multiple sclerosis. Findings could represent CADASIL though, typically demonstrates greater anterior temporal pole involvement. MRA HEAD: Normal. Electronically Signed   By: Awilda Metro M.D.   On: 07/22/2015 02:35    CBC  Recent Labs Lab 07/21/15 1603  WBC 10.0  HGB 14.0  HCT 43.1  PLT 301  MCV 91.5  MCH 29.7  MCHC 32.5  RDW 13.9    Chemistries   Recent Labs Lab  07/21/15 1603  NA 139  K 4.3  CL 111  CO2 18*  GLUCOSE 103*  BUN 9  CREATININE 0.59  CALCIUM 9.1  AST 16  ALT 11*  ALKPHOS 72  BILITOT 0.5   ------------------------------------------------------------------------------------------------------------------ estimated creatinine clearance is 106.7 mL/min (by C-G formula based on Cr of  0.59). ------------------------------------------------------------------------------------------------------------------ No results for input(s): HGBA1C in the last 72 hours. ------------------------------------------------------------------------------------------------------------------  Recent Labs  07/22/15 0551  CHOL 239*  HDL 45  LDLCALC 168*  TRIG 131  CHOLHDL 5.3   ------------------------------------------------------------------------------------------------------------------ No results for input(s): TSH, T4TOTAL, T3FREE, THYROIDAB in the last 72 hours.  Invalid input(s): FREET3 ------------------------------------------------------------------------------------------------------------------ No results for input(s): VITAMINB12, FOLATE, FERRITIN, TIBC, IRON, RETICCTPCT in the last 72 hours.  Coagulation profile No results for input(s): INR, PROTIME in the last 168 hours.  No results for input(s): DDIMER in the last 72 hours.  Cardiac Enzymes No results for input(s): CKMB, TROPONINI, MYOGLOBIN in the last 168 hours.  Invalid input(s): CK ------------------------------------------------------------------------------------------------------------------ Invalid input(s): POCBNP  No results for input(s): GLUCAP in the last 72 hours.   Chenee Munns M.D. [facility] 07/22/2015, 1:17 PM  Pager: [phone] Between 7am to 7pm - call Pager - [phone]  After 7pm go to [email or web address] - password [email or web address]  Call night coverage person covering after 7pm

## 2015-07-23 ENCOUNTER — Inpatient Hospital Stay (HOSPITAL_COMMUNITY): Payer: Self-pay

## 2015-07-23 ENCOUNTER — Inpatient Hospital Stay (HOSPITAL_COMMUNITY): Payer: Medicaid Other

## 2015-07-23 DIAGNOSIS — G459 Transient cerebral ischemic attack, unspecified: Secondary | ICD-10-CM

## 2015-07-23 DIAGNOSIS — F331 Major depressive disorder, recurrent, moderate: Secondary | ICD-10-CM | POA: Diagnosis present

## 2015-07-23 DIAGNOSIS — F332 Major depressive disorder, recurrent severe without psychotic features: Secondary | ICD-10-CM

## 2015-07-23 DIAGNOSIS — G43719 Chronic migraine without aura, intractable, without status migrainosus: Secondary | ICD-10-CM

## 2015-07-23 DIAGNOSIS — F444 Conversion disorder with motor symptom or deficit: Secondary | ICD-10-CM

## 2015-07-23 LAB — BASIC METABOLIC PANEL
ANION GAP: 8 (ref 5–15)
BUN: 14 mg/dL (ref 6–20)
CALCIUM: 8.2 mg/dL — AB (ref 8.9–10.3)
CO2: 21 mmol/L — ABNORMAL LOW (ref 22–32)
Chloride: 112 mmol/L — ABNORMAL HIGH (ref 101–111)
Creatinine, Ser: 0.6 mg/dL (ref 0.44–1.00)
GLUCOSE: 122 mg/dL — AB (ref 65–99)
Potassium: 3.4 mmol/L — ABNORMAL LOW (ref 3.5–5.1)
SODIUM: 141 mmol/L (ref 135–145)

## 2015-07-23 LAB — HEMOGLOBIN A1C
HEMOGLOBIN A1C: 5.3 % (ref 4.8–5.6)
MEAN PLASMA GLUCOSE: 105 mg/dL

## 2015-07-23 LAB — CBC
HCT: 33.5 % — ABNORMAL LOW (ref 36.0–46.0)
Hemoglobin: 11.2 g/dL — ABNORMAL LOW (ref 12.0–15.0)
MCH: 30.8 pg (ref 26.0–34.0)
MCHC: 33.4 g/dL (ref 30.0–36.0)
MCV: 92 fL (ref 78.0–100.0)
PLATELETS: 266 10*3/uL (ref 150–400)
RBC: 3.64 MIL/uL — ABNORMAL LOW (ref 3.87–5.11)
RDW: 13.9 % (ref 11.5–15.5)
WBC: 11.5 10*3/uL — AB (ref 4.0–10.5)

## 2015-07-23 MED ORDER — PNEUMOCOCCAL VAC POLYVALENT 25 MCG/0.5ML IJ INJ
0.5000 mL | INJECTION | Freq: Once | INTRAMUSCULAR | Status: DC
  Filled 2015-07-23: qty 0.5

## 2015-07-23 MED ORDER — TOPIRAMATE 25 MG PO TABS: 75.0000 mg | ORAL_TABLET | Freq: Two times a day (BID) | ORAL | Status: DC

## 2015-07-23 MED ORDER — TOPIRAMATE 25 MG PO TABS
75.0000 mg | ORAL_TABLET | Freq: Two times a day (BID) | ORAL | Status: DC
  Administered 2015-07-23 – 2015-07-24 (×2): 75 mg via ORAL
  Filled 2015-07-23 (×2): qty 3

## 2015-07-23 NOTE — Consult Note (Signed)
[facility] Consult   Reason for Consult:  Unexplained new onset of weakness and questionable psychogenic origin Referring Physician:  Dr. Tana Coast Patient Identification: Mary [name] MRN:  [id number] Principal Diagnosis: Conversion disorder, acute episode, with weakness or paralysis Diagnosis:   Patient Active Problem List   Diagnosis Date Noted  . TIA (transient ischemic attack) [G45.9] 07/22/2015  . Vascular headache [G44.1] 07/22/2015  . Lupus ([place]) [M32.9] 07/22/2015  . Intractable chronic migraine without aura and without status migrainosus [G43.719]   . Conversion disorder, acute episode, with weakness or paralysis [F44.4]   . Systemic lupus erythematosus related syndrome ([place]) [M32.9] 05/21/2015  . Stuttering [F80.81]   . UTI (lower urinary tract infection) [N39.0] 04/22/2015  . Fever [R50.9]   . Cephalalgia [R51]   . Right sided weakness [M62.89]   . Functional neurologic complaint [G98.8]   . Right sided weakness [M62.89]   . White matter changes [G93.49]   . Multiple sclerosis ([place]) [G35] 12/29/2014  . Right hemiparesis ([place]) [G81.91] 11/01/2014  . Lumbar radiculopathy [M54.16] 11/01/2014  . Low back pain [M54.5] 11/01/2014  . 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 Pancreatic pseudocyst [K86.3] 10/02/2014  . Nausea with vomiting [R11.2] 10/02/2014  . Nerve pain [M79.2] 09/25/2014  . Fever [R50.9] 04/01/2014  . Idiopathic acute pancreatitis [K85.00] 03/11/2014  . Nausea and vomiting [R11.2] 03/11/2014  . Abdominal pain, epigastric [R10.13] 03/11/2014  . Adjustment disorder with mixed anxiety and depressed mood [F43.23] 02/23/2014  . Thrush, oral [B37.0] 02/12/2014  . GERD (gastroesophageal reflux disease) [K21.9] 02/11/2014  . Abdominal pain [R10.9] 07/26/2013  . Nausea and vomiting in adult [R11.0] 07/26/2013  . Pancreatitis, recurrent ([place]) [K86.1] 06/14/2013  . 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 ? Adrenal insufficiency- Work up negative [E27.40] 04/23/2013  . Chronic pancreatitis with chronic pseudocysts [K86.1] 04/23/2013  . Abnormal MRI [R93.8] 04/23/2013  . Persistent vomiting [R11.10] 04/22/2013  . Diarrhea [R19.7] 04/21/2013  . Anemia [D64.9] 04/20/2013  . Protein-calorie malnutrition, severe ([place]) [E43] 04/16/2013  . Obesity, morbid ([place]) [E66.01] 03/15/2013  . Pancreatic cyst [K86.2] 03/15/2013  . Acute inflammation of the pancreas [K85.9] 03/15/2013  . Cyst and pseudocyst of pancreas [K86.2, K86.3] 03/15/2013  . Extreme obesity ([place]) [E66.01] 03/15/2013    Total Time spent with patient: 1 hour  Subjective:   Mary [name] is a 39 y.o. female patient admitted with headache in the back of her head with associated right sided weakness.  HPI:  Mary [name] is a 39 y.o. female, single seen, chart reviewed for psychiatric consultation and evaluation. Patient has no complaints during this evaluation and reportedly suffering with neurological symptoms like migraine headaches and right-sided weakness. Patient reportedly has extensive evaluation for her neurological symptoms and initially thought about multiple sclerosis but later she was given diagnosis of lupus. Patient had followed by rheumatology at [facility]. Patient reported she was never married and has no children. She ended her relationship of 16 years about 2 years ago secondary to feeling like she needed to end it. She is currently happy with her new relationship and has plans to go to [place] spent with her family. Patient reported she awesome support system from stepmother and stepbrother in [place]. Patient is relocated to [place] 2 years ago after spending multiple he is suffering life in [place]. Patient was evaluated by neurology and reviewed the second neurology consult. Patient has been satisfied with her antidepressant medication Zoloft 150 mg daily and  has no reported side effects.  Patient denies current symptoms of depression, anxiety, psychosis. Patient denies active suicidal/homicidal ideation, intention or plans. Patient requested to continue her current medication without any changes during this visit.   Past Psychiatric History: Denied inpatient psychiatric services but receiving antidepressant medication from primary care physician.    Risk to Self: Is patient at risk for suicide?: No Risk to Others:   Prior Inpatient Therapy:   Prior Outpatient Therapy:    Past Medical History:  Past Medical History  Diagnosis Date  . Hernia 03-24-13    ventral hernia remains   . Pancreatitis 03-24-13    2002, 2'2013, 03-14-13  . Pancreatic cyst 2002 onset  . Depression     "recent breakup with partner of 15 yrs"  . Adrenal insufficiency ([place]) 04/23/2013    ??  . MS (multiple sclerosis) ([place])   . Ventral hernia   . GERD (gastroesophageal reflux disease)   . Depression   . Anxiety   . Abdominal hernia     Past Surgical History  Procedure Laterality Date  . Abdominal surgery  ~ 2007    some sort of pancreatic cyst drainage.   . Cholecystectomy      ~ age 85-laparoscopic  . Adenoidectomy    . 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 Eus N/A 04/14/2013    Procedure: UPPER ENDOSCOPIC ULTRASOUND (EUS) LINEAR;  Surgeon: Milus Banister, MD;  Location: [facility];  Service: Endoscopy;  Laterality: N/A;  . Abdominal surgery    . Roux-en-y gastric bypass    . Cholecystectomy     Family History:  Family History  Problem Relation Age of Onset  . Lupus Neg Hx   . Sarcoidosis Neg Hx   . Pancreatitis Neg Hx   . Ataxia Neg Hx   . Chorea Neg Hx   . Dementia Neg Hx   . Mental retardation Neg Hx   . Migraines Neg Hx   . Multiple sclerosis Neg Hx   . Neurofibromatosis Neg Hx   . Neuropathy Neg Hx   . Parkinsonism Neg Hx   . Seizures Neg Hx   . Stroke Neg Hx   . Colitis Maternal Aunt   . Diabetes Mother   . Diabetes  Father   . Diabetes      many family members   Family Psychiatric  History: Unknown Social History:  History  Alcohol Use No     History  Drug Use No    Social History   Social History  . Marital Status: Single    Spouse Name: N/A  . Number of Children: N/A  . Years of Education: N/A   Social History Main Topics  . Smoking status: Former Smoker -- 0.50 packs/day for 20 years    Types: Cigarettes    Quit date: 05/22/2013  . Smokeless tobacco: Never Used  . Alcohol Use: No  . Drug Use: No  . Sexual Activity: No   Other Topics Concern  . None   Social History Narrative   ** Merged History Encounter **       Lives permanently in [place] with mom and stepfather.  Has not started working yet and was unemployed in [place].            Additional Social History:  Allergies:   Allergies  Allergen Reactions  . Amoxicillin Anaphylaxis  . Latex Rash  . Morphine And Related Anaphylaxis    unknown  . Penicillins Anaphylaxis  . Buprenorphine Other (See Comments)    unknown  . Latex Itching  . Morphine And Related Nausea And Vomiting  . Amoxicillin Rash  . Buprenorphine Hcl Nausea And Vomiting  . Penicillins Rash    Labs:  Results for orders placed or performed during the hospital encounter of 07/21/15 (from the past 48 hour(s))  I-Stat beta hCG blood, ED ([facility], [facility], [facility] only)     Status: None   Collection Time: 07/21/15  4:00 PM  Result Value Ref Range   I-stat hCG, quantitative <5.0 <5 mIU/mL   Comment 3            Comment:   GEST. AGE      CONC.  (mIU/mL)   <=1 WEEK        5 - 50     2 WEEKS       50 - 500     3 WEEKS       100 - 10,000     4 WEEKS     1,000 - 30,000        FEMALE AND NON-PREGNANT FEMALE:     LESS THAN 5 mIU/mL   Comprehensive metabolic panel     Status: Abnormal   Collection Time: 07/21/15  4:03 PM  Result Value Ref Range   Sodium 139 135 - 145 mmol/L   Potassium 4.3 3.5 - 5.1 mmol/L   Chloride 111 101 - 111  mmol/L   CO2 18 (L) 22 - 32 mmol/L   Glucose, Bld 103 (H) 65 - 99 mg/dL   BUN 9 6 - 20 mg/dL   Creatinine, Ser 0.59 0.44 - 1.00 mg/dL   Calcium 9.1 8.9 - 10.3 mg/dL   Total Protein 8.0 6.5 - 8.1 g/dL   Albumin 4.0 3.5 - 5.0 g/dL   AST 16 15 - 41 U/L   ALT 11 (L) 14 - 54 U/L   Alkaline Phosphatase 72 38 - 126 U/L   Total Bilirubin 0.5 0.3 - 1.2 mg/dL   GFR calc non Af Amer >60 >60 mL/min   GFR calc Af Amer >60 >60 mL/min    Comment: (NOTE) The eGFR has been calculated using the CKD EPI equation. This calculation has not been validated in all clinical situations. eGFR's persistently <60 mL/min signify possible Chronic Kidney Disease.    Anion gap 10 5 - 15  CBC     Status: None   Collection Time: 07/21/15  4:03 PM  Result Value Ref Range   WBC 10.0 4.0 - 10.5 K/uL   RBC 4.71 3.87 - 5.11 MIL/uL   Hemoglobin 14.0 12.0 - 15.0 g/dL   HCT 43.1 36.0 - 46.0 %   MCV 91.5 78.0 - 100.0 fL   MCH 29.7 26.0 - 34.0 pg   MCHC 32.5 30.0 - 36.0 g/dL   RDW 13.9 11.5 - 15.5 %   Platelets 301 150 - 400 K/uL  Urinalysis, Routine w reflex microscopic (not at [facility])     Status: Abnormal   Collection Time: 07/21/15  6:20 PM  Result Value Ref Range   Color, Urine YELLOW YELLOW   APPearance CLOUDY (A) CLEAR   Specific Gravity, Urine 1.015 1.005 - 1.030   pH 5.5 5.0 - 8.0   Glucose, UA NEGATIVE NEGATIVE mg/dL   Hgb urine dipstick NEGATIVE NEGATIVE  Bilirubin Urine NEGATIVE NEGATIVE   Ketones, ur NEGATIVE NEGATIVE mg/dL   Protein, ur NEGATIVE NEGATIVE mg/dL   Nitrite NEGATIVE NEGATIVE   Leukocytes, UA TRACE (A) NEGATIVE  Urine microscopic-add on     Status: Abnormal   Collection Time: 07/21/15  6:20 PM  Result Value Ref Range   Squamous Epithelial / LPF 0-5 (A) NONE SEEN   WBC, UA 0-5 0 - 5 WBC/hpf   RBC / HPF 0-5 0 - 5 RBC/hpf   Bacteria, UA MANY (A) NONE SEEN   Crystals HIPPURIC ACID CRYSTALS (A) NEGATIVE  Lipid panel     Status: Abnormal   Collection Time: 07/22/15  5:51 AM  Result  Value Ref Range   Cholesterol 239 (H) 0 - 200 mg/dL   Triglycerides 131 <150 mg/dL   HDL 45 >40 mg/dL   Total CHOL/HDL Ratio 5.3 RATIO   VLDL 26 0 - 40 mg/dL   LDL Cholesterol 168 (H) 0 - 99 mg/dL    Comment:        Total Cholesterol/HDL:CHD Risk Coronary Heart Disease Risk Table                     Men   Women  1/2 Average Risk   3.4   3.3  Average Risk       5.0   4.4  2 X Average Risk   9.6   7.1  3 X Average Risk  23.4   11.0        Use the calculated Patient Ratio above and the CHD Risk Table to determine the patient's CHD Risk.        ATP III CLASSIFICATION (LDL):  <100     mg/dL   Optimal  100-129  mg/dL   Near or Above                    Optimal  130-159  mg/dL   Borderline  160-189  mg/dL   High  >190     mg/dL   Very High   CBC     Status: Abnormal   Collection Time: 07/23/15  3:02 AM  Result Value Ref Range   WBC 11.5 (H) 4.0 - 10.5 K/uL   RBC 3.64 (L) 3.87 - 5.11 MIL/uL   Hemoglobin 11.2 (L) 12.0 - 15.0 g/dL   HCT 33.5 (L) 36.0 - 46.0 %   MCV 92.0 78.0 - 100.0 fL   MCH 30.8 26.0 - 34.0 pg   MCHC 33.4 30.0 - 36.0 g/dL   RDW 13.9 11.5 - 15.5 %   Platelets 266 150 - 400 K/uL  Basic metabolic panel     Status: Abnormal   Collection Time: 07/23/15  3:02 AM  Result Value Ref Range   Sodium 141 135 - 145 mmol/L   Potassium 3.4 (L) 3.5 - 5.1 mmol/L    Comment: DELTA CHECK NOTED   Chloride 112 (H) 101 - 111 mmol/L   CO2 21 (L) 22 - 32 mmol/L   Glucose, Bld 122 (H) 65 - 99 mg/dL   BUN 14 6 - 20 mg/dL   Creatinine, Ser 0.60 0.44 - 1.00 mg/dL   Calcium 8.2 (L) 8.9 - 10.3 mg/dL   GFR calc non Af Amer >60 >60 mL/min   GFR calc Af Amer >60 >60 mL/min    Comment: (NOTE) The eGFR has been calculated using the CKD EPI equation. This calculation has not been validated in all clinical situations. eGFR's persistently <60 mL/min  signify possible Chronic Kidney Disease.    Anion gap 8 5 - 15    Current Facility-Administered Medications  Medication Dose Route  Frequency Provider Last Rate Last Dose  . 0.9 %  sodium chloride infusion   Intravenous Continuous Ripudeep Krystal Eaton, MD 100 mL/hr at 07/22/15 2035    . aspirin chewable tablet 81 mg  81 mg Oral Daily Theressa Millard, MD   81 mg at 07/22/15 0947  . azaTHIOprine (IMURAN) tablet 50 mg  50 mg Oral BID Theressa Millard, MD   50 mg at 07/22/15 2135  . baclofen (LIORESAL) tablet 5 mg  5 mg Oral TID Ram Fuller Mandril, MD   5 mg at 07/22/15 2033  . clonazePAM (KLONOPIN) tablet 0.5 mg  0.5 mg Oral BID PRN Theressa Millard, MD   0.5 mg at 07/22/15 2135  . cyclobenzaprine (FLEXERIL) tablet 10 mg  10 mg Oral TID PRN Theressa Millard, MD      . enoxaparin (LOVENOX) injection 40 mg  40 mg Subcutaneous Q24H Theressa Millard, MD   40 mg at 07/22/15 0947  . HYDROmorphone (DILAUDID) injection 0.5 mg  0.5 mg Intravenous Q3H PRN Ripudeep Krystal Eaton, MD   0.5 mg at 07/23/15 0644  . hydroxychloroquine (PLAQUENIL) tablet 300 mg  300 mg Oral Daily Theressa Millard, MD   300 mg at 07/22/15 5093  . hydrOXYzine (ATARAX/VISTARIL) tablet 50 mg  50 mg Oral TID PRN Theressa Millard, MD      . lipase/protease/amylase (CREON) capsule 24,000 Units  2 capsule Oral TID WC Theressa Millard, MD   24,000 Units at 07/22/15 1700  . oxyCODONE (Oxy IR/ROXICODONE) immediate release tablet 10 mg  10 mg Oral Q6H PRN Ripudeep Krystal Eaton, MD   10 mg at 07/22/15 0947  . pneumococcal 23 valent vaccine (PNU-IMMUNE) injection 0.5 mL  0.5 mL Intramuscular Once Ripudeep K Rai, MD      . senna-docusate (Senokot-S) tablet 1 tablet  1 tablet Oral QHS PRN Theressa Millard, MD      . sertraline (ZOLOFT) tablet 150 mg  150 mg Oral Daily Theressa Millard, MD   150 mg at 07/22/15 0948  . topiramate (TOPAMAX) tablet 50 mg  50 mg Oral BID Theressa Millard, MD   50 mg at 07/22/15 2135  . valproate (DEPACON) 125 mg in dextrose 5 % 50 mL IVPB  125 mg Intravenous 4 times per day Ram Fuller Mandril, MD   125 mg at 07/23/15 0636     Musculoskeletal: Strength & Muscle Tone: decreased Gait & Station: unable to stand Patient leans: N/A  Psychiatric Specialty Exam: ROS right-sided weakness, denied nausea, vomiting, abdominal pain, chest pain and shortness of breath No Fever-chills, No Headache, No changes with Vision or hearing, reports vertigo No problems swallowing food or Liquids, No Chest pain, Cough or Shortness of Breath, No Abdominal pain, No Nausea or Vommitting, Bowel movements are regular, No Blood in stool or Urine, No dysuria, No new skin rashes or bruises, No new joints pains-aches,  No new weakness, tingling, numbness in any extremity, No recent weight gain or loss, No polyuria, polydypsia or polyphagia,   A full 10 point Review of Systems was done, except as stated above, all other Review of Systems were negative.  Blood pressure 101/53, pulse 70, temperature 97.8 F (36.6 C), temperature source Oral, resp. rate 14, height 5' 3"  (1.6 m), weight 98.657 kg (217 lb 8 oz), SpO2 100 %.Body  mass index is 38.54 kg/(m^2).  General Appearance: Casual  Eye Contact::  Good  Speech:  Clear and Coherent  Volume:  Decreased  Mood:  Euthymic  Affect:  Constricted and Depressed  Thought Process:  Coherent and Goal Directed  Orientation:  Full (Time, Place, and Person)  Thought Content:  WDL  Suicidal Thoughts:  No  Homicidal Thoughts:  No  Memory:  Immediate;   Good Recent;   Good Remote;   Good  Judgement:  Fair  Insight:  Good  Psychomotor Activity:  Decreased  Concentration:  Good  Recall:  Good  Fund of Knowledge:Good  Language: Good  Akathisia:  Negative  Handed:  Right  AIMS (if indicated):     Assets:  Communication Skills Desire for Improvement Financial Resources/Insurance Leisure Time Resilience Social Support Talents/Skills Transportation  ADL's:  Impaired  Cognition: WNL  Sleep:      Treatment Plan Summary: Daily contact with patient to assess and evaluate symptoms and  progress in treatment and Medication management  Continue Zoloft 150 mg daily for depression  Disposition: Patient may be discharged to the outpatient psychiatric services and medical services and medically stable Patient does not meet criteria for psychiatric inpatient admission. Supportive therapy provided about ongoing stressors.  Leonard Hendler,JANARDHAHA R. 07/23/2015 9:24 AM

## 2015-07-23 NOTE — Evaluation (Signed)
Occupational Therapy Evaluation Patient Details Name: Mary [name] MRN: [id number] DOB: [birth year] Today's Date: 07/23/2015    History of Present Illness Pt is a 39 y/o female who presents with R sided weakness. Imaging revealed chronic extensive demyelinating lesions, however no acute changes.    Clinical Impression   Pt was independent in self care prior to admission.  Reports using a cane or walker when she has "flare up." Pt presents with R side weakness, impaired balance and slow mentation interfering with ability to perform self care and mobility.  Pt with 8/10 HA also limiting functioning. She moves very slowly.  Will follow acutely.    Follow Up Recommendations  Home health OT (may progress to not needing)    Equipment Recommendations  None recommended by OT    Recommendations for Other Services       Precautions / Restrictions Precautions Precautions: Fall Restrictions Weight Bearing Restrictions: No      Mobility Bed Mobility Overal bed mobility: Modified Independent             General bed mobility comments: used rail, HOB up and moves slowly, but no physical assist required  Transfers Overall transfer level: Needs assistance Equipment used: Rolling walker (2 wheeled) Transfers: Sit to/from Stand Sit to Stand: Min guard         General transfer comment: Increased time, guarding for safety, no physical assist.    Balance     Sitting balance-Leahy Scale: Fair       Standing balance-Leahy Scale: Poor                              ADL Overall ADL's : Needs assistance/impaired Eating/Feeding: Independent;Sitting Eating/Feeding Details (indicate cue type and reason): uses both hands Grooming: Wash/dry hands;Min guard;Standing   Upper Body Bathing: Minimal assitance;Sitting   Lower Body Bathing: Sit to/from stand;Minimal assistance   Upper Body Dressing : Minimal assistance;Sitting Upper Body Dressing Details (indicate cue  type and reason): front opening gown Lower Body Dressing: Sit to/from stand;Minimal assistance   Toilet Transfer: Min guard;Ambulation;RW   Toileting- Clothing Manipulation and Hygiene: Minimal assistance;Sit to/from stand Toileting - Clothing Manipulation Details (indicate cue type and reason): assist for gown       General ADL Comments: Pt stating "I can't" when asked to don/doff socks, would not attempt.     Vision Additional Comments: pt reports intermittent blurriness and diplopia, none currently   Perception     Praxis      Pertinent Vitals/Pain Pain Assessment: 0-10 Pain Score: 8  Pain Location: head Pain Descriptors / Indicators: Aching Pain Intervention(s): Limited activity within patient's tolerance;Monitored during session;Patient requesting pain meds-RN notified     Hand Dominance Right   Extremity/Trunk Assessment Upper Extremity Assessment Upper Extremity Assessment: RUE deficits/detail RUE Deficits / Details: 3-/5 shoulder, 3/5 elbow to hand, decreased effort against resistance, inconsistency in functional use of R UE vs formal testing RUE Sensation: decreased light touch (decreased pain, temp) RUE Coordination: decreased fine motor;decreased gross motor   Lower Extremity Assessment Lower Extremity Assessment: Defer to PT evaluation       Communication Communication Communication: Expressive difficulties (stuttering, slow speech)   Cognition Arousal/Alertness: Awake/alert Behavior During Therapy: Flat affect Overall Cognitive Status: No family/caregiver present to determine baseline cognitive functioning (slow mentation/response speed)                     General Comments  Exercises       Shoulder Instructions      Home Living Family/patient expects to be discharged to:: Private residence Living Arrangements: Parent Available Help at Discharge: Family;Available 24 hours/day Type of Home: House Home Access: Ramped entrance      Home Layout: One level     Bathroom Shower/Tub: Chief Strategy Officer: Standard     Home Equipment: Cane - single point;Wheelchair - [employer] - 2 wheels          Prior Functioning/Environment Level of Independence: Needs assistance  Gait / Transfers Assistance Needed: Pt reports that when she has "flare ups" she may use the walker or the wheelchair depending on how bad she feels. ADL's / Homemaking Assistance Needed: Independent in ADL, light housekeeping, light meal prep.        OT Diagnosis: Generalized weakness;Acute pain   OT Problem List: Decreased strength;Decreased activity tolerance;Impaired balance (sitting and/or standing);Decreased coordination;Impaired sensation;Obesity;Pain;Impaired UE functional use   OT Treatment/Interventions: Self-care/ADL training;Therapeutic exercise;DME and/or AE instruction;Balance training;Patient/family education;Therapeutic activities    OT Goals(Current goals can be found in the care plan section) Acute Rehab OT Goals Patient Stated Goal: Pt did not state goals during session. OT Goal Formulation: With patient Time For Goal Achievement: 07/30/15 Potential to Achieve Goals: Good ADL Goals Pt Will Perform Grooming: with supervision;standing Pt Will Perform Lower Body Bathing: with supervision;sit to/from stand Pt Will Perform Lower Body Dressing: with supervision;sit to/from stand Pt Will Transfer to Toilet: with supervision;ambulating;regular height toilet Pt Will Perform Toileting - Clothing Manipulation and hygiene: with supervision;sit to/from stand Pt Will Perform Tub/Shower Transfer: Tub transfer;with supervision;ambulating  OT Frequency: Min 2X/week   Barriers to D/C:            Co-evaluation              End of Session Equipment Utilized During Treatment: Gait belt;Rolling walker Nurse Communication: Patient requests pain meds  Activity Tolerance: Patient limited by pain;Patient limited by  fatigue;Patient limited by lethargy Patient left: in bed;with call bell/phone within reach   Time: 1520-1540 OT Time Calculation (min): 20 min Charges:  OT General Charges $OT Visit: 1 Procedure OT Evaluation $Initial OT Evaluation Tier I: 1 Procedure G-Codes:    Evern Bio 07/23/2015, 3:55 PM  [phone]

## 2015-07-23 NOTE — Evaluation (Signed)
Speech Language Pathology Evaluation Patient Details Name: Mary [name] MRN: [id number] DOB: 03-26-1976 Today's Date: 07/23/2015 Time: 4098-1191 SLP Time Calculation (min) (ACUTE ONLY): 29 min  Problem List:  Patient Active Problem List   Diagnosis Date Noted  . TIA (transient ischemic attack) 07/22/2015  . Vascular headache 07/22/2015  . Lupus (HCC) 07/22/2015  . Intractable chronic migraine without aura and without status migrainosus   . Conversion disorder, acute episode, with weakness or paralysis   . Systemic lupus erythematosus related syndrome (HCC) 05/21/2015  . Stuttering   . UTI (lower urinary tract infection) 04/22/2015  . Fever   . Cephalalgia   . Right sided weakness   . Functional neurologic complaint   . Right sided weakness   . White matter changes   . Multiple sclerosis (HCC) 12/29/2014  . Right hemiparesis (HCC) 11/01/2014  . Lumbar radiculopathy 11/01/2014  . Low back pain 11/01/2014  . 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 Pancreatic pseudocyst 10/02/2014  . Nausea with vomiting 10/02/2014  . Nerve pain 09/25/2014  . Fever 04/01/2014  . Idiopathic acute pancreatitis 03/11/2014  . Nausea and vomiting 03/11/2014  . Abdominal pain, epigastric 03/11/2014  . Adjustment disorder with mixed anxiety and depressed mood 02/23/2014  . Thrush, oral 02/12/2014  . GERD (gastroesophageal reflux disease) 02/11/2014  . Abdominal pain 07/26/2013  . Nausea and vomiting in adult 07/26/2013  . Pancreatitis, recurrent (HCC) 06/14/2013  . Low serum cortisol level (HCC) 04/25/2013  . ? Adrenal insufficiency- Work up negative 04/23/2013  . Chronic pancreatitis with chronic pseudocysts 04/23/2013  . Abnormal MRI 04/23/2013  . Persistent vomiting 04/22/2013  . Diarrhea 04/21/2013  . Anemia 04/20/2013  . Protein-calorie malnutrition, severe (HCC) 04/16/2013  . Obesity, morbid (HCC) 03/15/2013  . Pancreatic cyst 03/15/2013  . Acute  inflammation of the pancreas 03/15/2013  . Cyst and pseudocyst of pancreas 03/15/2013  . Extreme obesity (HCC) 03/15/2013   HPI:  Mary [name] is a 39 y.o. female with recent diagnosis of SLE like Syndrome, followed by Rheumatology at [facility] who presents to  the ED with complaints of severe sharp persistent headache in the back of her head with associated right sided weakness x 3 days. She has also had stuttering and hesitant speech and anomia which her Step-Mother at bedside reports that she has during exacerbations. A TIA workup was initiated, and a head ct was performed and negative for acute findings, and she was seen in the ED by Neurology Dr. Leroy Kennedy. She was administered IV Solumedrol and reports that her symptoms feel as if they are improving.    Assessment / Plan / Recommendation Clinical Impression  Cognitive/linguisticmotor speech evaluation was completed.  The patient achieved a scored of 23/30.  Deficits were noted for executive function task, memory given interference and word fluency.  Mild dysfluencies were noted characterized by part word repetitions.  The patient was able to solve basic problems and has insight and good safety awareness.  Oral mechanism exam revealed inconsistent left sided facial weakness.  Lingual ROM and strength appeared to be good.  Mildly delayed diadochokinetic rates were noted.  Overall, the patient presented with mild cognitive impairment, mild dysfluency and mild dysarthria.  Per the patient, she lives with her mother and has limited responsibilities at home.  Recommend ST at this level and follow up at next level if symptoms persist.      SLP Assessment  Patient needs continued Speech Lanaguage Pathology Services    Follow  Up Recommendations   (to be determined)    Frequency and Duration min 1 x/week  2 weeks      SLP Evaluation Prior Functioning  Cognitive/Linguistic Baseline: Within functional limits Type of Home: House  Lives  With:  (Mom) Available Help at Discharge: Family;Available 24 hours/day   Cognition  Overall Cognitive Status: Impaired/Different from baseline Arousal/Alertness: Awake/alert Orientation Level: Oriented X4 Attention: Sustained Sustained Attention: Appears intact Memory: Impaired Memory Impairment: Decreased recall of new information Awareness: Appears intact Problem Solving: Appears intact Safety/Judgment: Appears intact    Comprehension  Auditory Comprehension Overall Auditory Comprehension: Appears within functional limits for tasks assessed Yes/No Questions: Within Functional Limits Commands: Within Functional Limits Conversation: Simple    Expression Expression Primary Mode of Expression: Verbal Verbal Expression Overall Verbal Expression: Appears within functional limits for tasks assessed Initiation: No impairment Automatic Speech: Name;Social Response Level of Generative/Spontaneous Verbalization: Conversation Naming: No impairment Pragmatics: No impairment Non-Verbal Means of Communication: Not applicable Written Expression Dominant Hand: Right Written Expression: Within Functional Limits (Pt was able to legibly write her albeit very slowly.  )   Oral / Motor Oral Motor/Sensory Function Overall Oral Motor/Sensory Function: Mild impairment Facial ROM: Reduced left Facial Symmetry: Abnormal symmetry left Facial Strength: Within Functional Limits Lingual ROM: Within Functional Limits Lingual Symmetry: Within Functional Limits Lingual Strength: Within Functional Limits Mandible: Within Functional Limits Motor Speech Overall Motor Speech: Impaired Respiration: Within functional limits Phonation: Normal Resonance: Within functional limits Articulation: Within functional limitis Intelligibility: Intelligible Motor Planning: Witnin functional limits Motor Speech Errors: Not applicable   Dimas Aguas, MA, CCC-SLP Acute Rehab SLP [phone]  Fleet Contras 07/23/2015, 9:46 AM

## 2015-07-23 NOTE — Progress Notes (Signed)
[facility]                                                                              Patient Demographics  Mary [name], is a 39 y.o. female, DOB - 1975-10-30, MBE:[id number]  Admit date - 07/21/2015   Admitting Physician Ron Parker, MD  Outpatient Primary MD for the patient is [name], NP  LOS - 1   Chief Complaint  Patient presents with  . Weakness       Brief HPI   Mary [name] is a 39 y.o. female with recent diagnosis of SLE like Syndrome, followed by Rheumatology at [facility] who presented to ED with complaints of severe sharp persistent headache in the back of her head with associated right sided weakness x 3 days. She has also had stuttering speech and some cognitive changes. Patient was seen by neurology and recommended admission for further workup. CT head was negative for acute findings. Patient was also given IV Solu-Medrol.     Assessment & Plan    Principal Problem:   Right sided weakness, slurred speech and cognitive deficits: Appears to be psychogenic or functional - Unclear etiology, neurology consulted, no definitive diagnosis at this time, possibility of an autoimmune disorder with CNS involvement versus CADASIL versus functional - MRI brain negative for acute stroke, moderate to severe stable white matter lesions with imaging characteristics of demyelination though not classic for multiple sclerosis, could represent CADASIL - MRA normal - PTOT evaluation-> no PT follow-up  - Neurology following, exam with functional components. Psychiatry consulted  Active Problems:  Vascular headache - Neurology following, continue migraine headache protocol - Patient also on chronic narcotics, had to restart to avoid withdrawals. Repeatedly requesting to increase Dilaudid and Klonopin, refusing oral pain medications which appears to be a pain medication seeking behavior.  - Neurology had started Depakote however discontinued  today as no improvement for headache, placed on Topamax and baclofen    Chronic pancreatitis with chronic pseudocysts - Continue Creon    Systemic lupus erythematosus related syndrome (HCC) - Continue Imuran, prednisone   Code Status: Full code  Family Communication: Discussed in detail with the patient, all imaging results, lab results explained to the patient    Disposition Plan:  Time Spent in minutes  25 minutes  Procedures  MRI, MRA brain  Consults   Neurology  DVT Prophylaxis  Lovenox   Medications  Scheduled Meds: . aspirin  81 mg Oral Daily  . azaTHIOprine  50 mg Oral BID  . baclofen  5 mg Oral TID  . enoxaparin (LOVENOX) injection  40 mg Subcutaneous Q24H  . hydroxychloroquine  300 mg Oral Daily  . lipase/protease/amylase  2 capsule Oral TID WC  . pneumococcal 23 valent vaccine  0.5 mL Intramuscular Once  . sertraline  150 mg Oral Daily  . topiramate  75 mg Oral BID   Continuous Infusions: . sodium chloride 100 mL/hr at 07/22/15 2035   PRN Meds:.clonazePAM, cyclobenzaprine, HYDROmorphone (DILAUDID) injection, hydrOXYzine, oxyCODONE, senna-docusate   Antibiotics   Anti-infectives    Start     Dose/Rate Route Frequency Ordered Stop  07/22/15 1000  hydroxychloroquine (PLAQUENIL) tablet 300 mg     300 mg Oral Daily 07/22/15 0314          Subjective:   Mary [name] was seen and examined today. Still with stuttering speech, right-sided weakness, headache.  Denies dizziness, chest pain, shortness of breath, abdominal pain, N/V/D/C. At the time of my encounter, patient was sitting with her legs crossed in the bed and eating breakfast, however during the examination, showed ?weakness in the right side.  Objective:   Blood pressure 106/60, pulse 68, temperature 97.6 F (36.4 C), temperature source Oral, resp. rate 20, height 5\' 3"  (1.6 m), weight 98.657 kg (217 lb 8 oz), SpO2 100 %.  Wt Readings from Last 3 Encounters:  07/22/15 98.657 kg (217 lb 8  oz)  04/22/15 91.173 kg (201 lb)  11/07/14 97.07 kg (214 lb)    No intake or output data in the 24 hours ending 07/23/15 1128  Exam  General: Alert and oriented x 3, NAD, stuttering speech  HEENT:  PERRLA, EOMI, Anicteric Sclera, mucous membranes moist.   Neck: Supple, no JVD, no masses  CVS: S1 S2 auscultated, no rubs, murmurs or gallops. Regular rate and rhythm.  Respiratory: Clear to auscultation bilaterally, no wheezing, rales or rhonchi  Abdomen: Soft, nontender, nondistended, + bowel sounds  Ext: no cyanosis clubbing or edema  Neuro: poor effort during the examination of the right upper and lower extremity with stuttering speech  Skin: No rashes  Psych: Normal affect and demeanor, alert and oriented x3    Data Review   Micro Results No results found for this or any previous visit (from the past 240 hour(s)).  Radiology Reports Ct Head Wo Contrast  07/21/2015  CLINICAL DATA:  Headaches since Thursday with nausea. Right-sided weakness. EXAM: CT HEAD WITHOUT CONTRAST TECHNIQUE: Contiguous axial images were obtained from the base of the skull through the vertex without intravenous contrast. COMPARISON:  MRI brain 04/22/2015. FINDINGS: Extensive diffuse subcortical white matter hypoattenuation is similar to the prior exams. No acute cortical infarct is present. The basal ganglia are intact. Insular ribbon is normal bilaterally. The ventricles are of normal size. No significant extra-axial fluid collection is present. No acute hemorrhage or mass lesion is present. The paranasal sinuses and mastoid air cells are clear. The calvarium is intact. Globes and orbits are within normal limits. No focal extracranial soft tissue lesions are present. IMPRESSION: 1. No acute intracranial abnormality or significant interval change. 2. Stable diffuse subcortical white matter disease. Electronically Signed   By: Marin Roberts M.D.   On: 07/21/2015 19:14   Mr Maxine Glenn Head Wo  Contrast  07/22/2015  CLINICAL DATA:  Autoimmune disorder versus CADASIL. Few day history of atypical headache followed by dizziness and RIGHT-sided weakness, difficulty speaking. History of depression, pancreatitis, multiple sclerosis. EXAM: MRI HEAD WITHOUT AND WITH CONTRAST MRA HEAD WITHOUT CONTRAST TECHNIQUE: Multiplanar, multiecho pulse sequences of the brain and surrounding structures were obtained without and with intravenous contrast. Angiographic images of the head were obtained using MRA technique without contrast. CONTRAST:  19mL MULTIHANCE GADOBENATE DIMEGLUMINE 529 MG/ML IV SOLN COMPARISON:  MRI of the head April 22, 2015 and CT head July 21, 2015 FINDINGS: MRI HEAD FINDINGS Multiple sequences are mildly or moderately motion degraded. The ventricles and sulci are normal for patient's age. Patchy to confluent supratentorial and pontine white matter T2 hyperintensities are unchanged, including RIGHT and possibly LEFT temporal lobe involvement though limited assessment due to patient motion. Lesions show low T1 signal  suggesting black holes of demyelination. No mass lesions, mass effect. No abnormal parenchymal enhancement. No reduced diffusion to suggest acute ischemia or hyperacute demyelination. No susceptibility artifact to suggest hemorrhage. No abnormal extra-axial fluid collections. No extra-axial masses nor leptomeningeal enhancement. Normal major intracranial vascular flow voids seen at the skull base. Ocular globes and orbital contents are unremarkable though not tailored for evaluation. No suspicious calvarial bone marrow signal. No abnormal sellar expansion. Craniocervical junction maintained. Visualized paranasal sinuses and mastoid air cells are well-aerated. MRA HEAD FINDINGS Anterior circulation: Normal flow related enhancement of the included cervical, petrous, cavernous and supraclinoid internal carotid arteries. Patent anterior communicating artery. Normal flow related enhancement  of the anterior and middle cerebral arteries, including distal segments. No large vessel occlusion, high-grade stenosis, abnormal luminal irregularity, aneurysm. Posterior circulation: Codominant vertebral artery's. Basilar artery is patent, with normal flow related enhancement of the main branch vessels. Normal flow related enhancement of the posterior cerebral arteries. Small bilateral posterior communicating arteries present. No large vessel occlusion, high-grade stenosis, abnormal luminal irregularity, aneurysm. IMPRESSION: MRI HEAD: No acute intracranial process. Moderate to severe stable white matter lesions, with imaging characteristics of demyelination though not classic for multiple sclerosis. Findings could represent CADASIL though, typically demonstrates greater anterior temporal pole involvement. MRA HEAD: Normal. Electronically Signed   By: Awilda Metro M.D.   On: 07/22/2015 02:35   Mr Laqueta Jean ZO Contrast  07/22/2015  CLINICAL DATA:  Autoimmune disorder versus CADASIL. Few day history of atypical headache followed by dizziness and RIGHT-sided weakness, difficulty speaking. History of depression, pancreatitis, multiple sclerosis. EXAM: MRI HEAD WITHOUT AND WITH CONTRAST MRA HEAD WITHOUT CONTRAST TECHNIQUE: Multiplanar, multiecho pulse sequences of the brain and surrounding structures were obtained without and with intravenous contrast. Angiographic images of the head were obtained using MRA technique without contrast. CONTRAST:  19mL MULTIHANCE GADOBENATE DIMEGLUMINE 529 MG/ML IV SOLN COMPARISON:  MRI of the head April 22, 2015 and CT head July 21, 2015 FINDINGS: MRI HEAD FINDINGS Multiple sequences are mildly or moderately motion degraded. The ventricles and sulci are normal for patient's age. Patchy to confluent supratentorial and pontine white matter T2 hyperintensities are unchanged, including RIGHT and possibly LEFT temporal lobe involvement though limited assessment due to patient  motion. Lesions show low T1 signal suggesting black holes of demyelination. No mass lesions, mass effect. No abnormal parenchymal enhancement. No reduced diffusion to suggest acute ischemia or hyperacute demyelination. No susceptibility artifact to suggest hemorrhage. No abnormal extra-axial fluid collections. No extra-axial masses nor leptomeningeal enhancement. Normal major intracranial vascular flow voids seen at the skull base. Ocular globes and orbital contents are unremarkable though not tailored for evaluation. No suspicious calvarial bone marrow signal. No abnormal sellar expansion. Craniocervical junction maintained. Visualized paranasal sinuses and mastoid air cells are well-aerated. MRA HEAD FINDINGS Anterior circulation: Normal flow related enhancement of the included cervical, petrous, cavernous and supraclinoid internal carotid arteries. Patent anterior communicating artery. Normal flow related enhancement of the anterior and middle cerebral arteries, including distal segments. No large vessel occlusion, high-grade stenosis, abnormal luminal irregularity, aneurysm. Posterior circulation: Codominant vertebral artery's. Basilar artery is patent, with normal flow related enhancement of the main branch vessels. Normal flow related enhancement of the posterior cerebral arteries. Small bilateral posterior communicating arteries present. No large vessel occlusion, high-grade stenosis, abnormal luminal irregularity, aneurysm. IMPRESSION: MRI HEAD: No acute intracranial process. Moderate to severe stable white matter lesions, with imaging characteristics of demyelination though not classic for multiple sclerosis. Findings could represent CADASIL though, typically demonstrates  greater anterior temporal pole involvement. MRA HEAD: Normal. Electronically Signed   By: Awilda Metro M.D.   On: 07/22/2015 02:35    CBC  Recent Labs Lab 07/21/15 1603 07/23/15 0302  WBC 10.0 11.5*  HGB 14.0 11.2*  HCT  43.1 33.5*  PLT 301 266  MCV 91.5 92.0  MCH 29.7 30.8  MCHC 32.5 33.4  RDW 13.9 13.9    Chemistries   Recent Labs Lab 07/21/15 1603 07/23/15 0302  NA 139 141  K 4.3 3.4*  CL 111 112*  CO2 18* 21*  GLUCOSE 103* 122*  BUN 9 14  CREATININE 0.59 0.60  CALCIUM 9.1 8.2*  AST 16  --   ALT 11*  --   ALKPHOS 72  --   BILITOT 0.5  --    ------------------------------------------------------------------------------------------------------------------ estimated creatinine clearance is 106.7 mL/min (by C-G formula based on Cr of 0.6). ------------------------------------------------------------------------------------------------------------------ No results for input(s): HGBA1C in the last 72 hours. ------------------------------------------------------------------------------------------------------------------  Recent Labs  07/22/15 0551  CHOL 239*  HDL 45  LDLCALC 168*  TRIG 131  CHOLHDL 5.3   ------------------------------------------------------------------------------------------------------------------ No results for input(s): TSH, T4TOTAL, T3FREE, THYROIDAB in the last 72 hours.  Invalid input(s): FREET3 ------------------------------------------------------------------------------------------------------------------ No results for input(s): VITAMINB12, FOLATE, FERRITIN, TIBC, IRON, RETICCTPCT in the last 72 hours.  Coagulation profile No results for input(s): INR, PROTIME in the last 168 hours.  No results for input(s): DDIMER in the last 72 hours.  Cardiac Enzymes No results for input(s): CKMB, TROPONINI, MYOGLOBIN in the last 168 hours.  Invalid input(s): CK ------------------------------------------------------------------------------------------------------------------ Invalid input(s): POCBNP  No results for input(s): GLUCAP in the last 72 hours.   Lasheka Kempner M.D. [facility] 07/23/2015, 11:28 AM  Pager: [phone] Between 7am to 7pm - call  Pager - [phone]  After 7pm go to [email or web address] - password [email or web address]  Call night coverage person covering after 7pm

## 2015-07-23 NOTE — Progress Notes (Signed)
  Echocardiogram 2D Echocardiogram has been performed.  Mary [name] 07/23/2015, 11:38 AM

## 2015-07-23 NOTE — Progress Notes (Signed)
Subjective: patient working with speech therapy.  Stuttering with her verbal output. Stating her left side is still weak "butit will get better it always does."  Still has a 3/10 headache.    Exam: Filed Vitals:   07/23/15 0229 07/23/15 0608  BP: 101/53 101/53  Pulse: 72 70  Temp: 97.7 F (36.5 C) 97.8 F (36.6 C)  Resp: 16 14    HEENT-  Normocephalic, no lesions, without obvious abnormality.  Normal external eye and conjunctiva.  Normal TM's bilaterally.  Normal auditory canals and external ears. Normal external nose, mucus membranes and septum.  Normal pharynx. Cardiovascular- S1, S2 normal, pulses palpable throughout   Lungs- chest clear, no wheezing, rales, normal symmetric air entry Abdomen- normal findings: bowel sounds normal Extremities- no joint deformities, effusion, or inflammation     Gen: In bed, NAD MS: alert oriented, follows commands, speech clear with initial stuttering CN: PERRLA, EOMI, TML, right cheek is pulled to the side but when asked to relax face sis symmetrical and smile is symmetrical. Holding right eye partially closed.  Motor: right arm and leg 5/5, left arm able to hold antigravity and when distracted showing 4/5 strength--poor effert when asked to show her strength. Leg leg has 4/5 strength when distracted but when asked to lift leg she only wiggles her toes.  Sensory: stated to be decreased on the right arm and leg  DTR:2+ throughout  Pertinent Labs: K+ 3.4   Felicie Morn PA-C [facility] [phone]  Impression: 39 YO female with right UE and LE weakness. On exam there is effort dependent strength noted. MRI brain shows no active MS lesions. Exam continues to show functional components.    Recommendations: 1) would D/C Depakote as she is getting no improvement for HA.  2) May continue Baclofen if getting benefit.  3) Will increase Topamax to 75 mg BID 4) Neurology will S/O   07/23/2015, 9:26 AM

## 2015-07-23 NOTE — Evaluation (Signed)
Physical Therapy Evaluation Patient Details Name: Mary THORNLEYRN: [id number] DOB: 1975-12-21 Today's Date: 07/23/2015   History of Present Illness  Pt is a 39 y/o female who presents with R sided weakness. Imaging revealed chronic extensive demyelinating lesions, however no acute changes.   Clinical Impression  Pt admitted with above diagnosis. Pt currently with functional limitations due to the deficits listed below (see PT Problem List). At the time of PT eval pt was able to perform transfers and ambulation with gross min guard assist and RW. Observed pt demonstrating mobility with the R side during distraction that was not consistent with attempts at strength/range testing. See Extremity/Trunk Assessment for details. It appears that pt has 24 hour assist at home, and ample equipment for safe mobility during "flare-ups" which pt states she has often. Pt will benefit from skilled PT to increase their independence and safety with mobility to allow discharge to the venue listed below.       Follow Up Recommendations Home health PT;Supervision for mobility/OOB    Equipment Recommendations  None recommended by PT    Recommendations for Other Services       Precautions / Restrictions Precautions Precautions: Fall Restrictions Weight Bearing Restrictions: No      Mobility  Bed Mobility Overal bed mobility: Modified Independent             General bed mobility comments: Pt was able to transition to EOB without assistance from therapist, however used LUE to move RUE and LLE to move RLE to EOB. Use of bed rails required.   Transfers Overall transfer level: Needs assistance Equipment used: Rolling walker (2 wheeled) Transfers: Sit to/from Stand Sit to Stand: Min guard         General transfer comment: Increased time to power-up to full standing position. Pt appears to use excessive energy to complete transfer initially.   Ambulation/Gait Ambulation/Gait assistance: Min  guard Ambulation Distance (Feet): 100 Feet Assistive device: Rolling walker (2 wheeled) Gait Pattern/deviations: Decreased stride length;Step-to pattern;Decreased dorsiflexion - right;Trunk flexed Gait velocity: Decreased Gait velocity interpretation: Below normal speed for age/gender General Gait Details: Pt initially dragging the RLE significantly to advance during ambulation. This improved with distance somewhat, but overall had a very poor floor clearance. Pt was cued to advance RLE first for ease of advancement and proper sequencing, however pt was not able to advance the RLE at all when attempting this way.   Stairs            Wheelchair Mobility    Modified Rankin (Stroke Patients Only)       Balance Overall balance assessment: Needs assistance Sitting-balance support: Feet supported;No upper extremity supported Sitting balance-Leahy Scale: Fair     Standing balance support: During functional activity Standing balance-Leahy Scale: Poor Standing balance comment: Pt was able to wash hands at sink while leaning her weight onto the counter.                             Pertinent Vitals/Pain Pain Assessment: 0-10 Pain Score: 8  Faces Pain Scale: Hurts whole lot Pain Location: Head Pain Descriptors / Indicators: Headache Pain Intervention(s): Patient requesting pain meds-RN notified    Home Living Family/patient expects to be discharged to:: Private residence Living Arrangements: Parent Available Help at Discharge: Family;Available 24 hours/day Type of Home: House Home Access: Ramped entrance     Home Layout: One level Home Equipment: Cane - single point;Wheelchair - [employer] -  2 wheels      Prior Function Level of Independence: Needs assistance   Gait / Transfers Assistance Needed: Pt reports that when she has "flare ups" she may use the walker or the wheelchair depending on how bad she feels.  ADL's / Homemaking Assistance Needed: History is  unclear regarding ADL's        Hand Dominance   Dominant Hand: Right    Extremity/Trunk Assessment   Upper Extremity Assessment: Defer to OT evaluation;RUE deficits/detail RUE Deficits / Details: Pt reports RUE weakness and is unable to complete strength/range testing, however appears to be [facility] with non-structured activity such as reaching for a cell phone or bearing weight through the walker.          Lower Extremity Assessment: RLE deficits/detail RLE Deficits / Details: Pt reports RLE weakness and initially drags R foot along with knee extended during ambulation (appeared to be putting a significant amount of weight through the RLE). During attempted MMT, pt unable to extend at knee at all, and appeared to put forth a minimal effort during eccentric knee flexion.     Cervical / Trunk Assessment: Normal (Forward head posture)  Communication   Communication: Expressive difficulties (Noted stuttering)  Cognition Arousal/Alertness: Lethargic Behavior During Therapy: Flat affect Overall Cognitive Status: Impaired/Different from baseline                      General Comments      Exercises        Assessment/Plan    PT Assessment Patient needs continued PT services  PT Diagnosis Difficulty walking;Abnormality of gait   PT Problem List Decreased strength;Decreased range of motion;Decreased activity tolerance;Decreased balance;Decreased mobility;Decreased coordination;Decreased cognition;Decreased knowledge of use of DME;Decreased safety awareness;Pain  PT Treatment Interventions DME instruction;Gait training;Stair training;Functional mobility training;Therapeutic activities;Therapeutic exercise;Neuromuscular re-education;Patient/family education   PT Goals (Current goals can be found in the Care Plan section) Acute Rehab PT Goals Patient Stated Goal: Pt did not state goals during session. PT Goal Formulation: With patient Time For Goal Achievement:  07/30/15 Potential to Achieve Goals: Good    Frequency Min 2X/week   Barriers to discharge        Co-evaluation               End of Session Equipment Utilized During Treatment: Gait belt Activity Tolerance: Patient tolerated treatment well Patient left: in chair;with call bell/phone within reach;Other (comment) (SLP present at end of session) Nurse Communication: Mobility status         Time: 0831-0901 PT Time Calculation (min) (ACUTE ONLY): 30 min   Charges:   PT Evaluation $Initial PT Evaluation Tier I: 1 Procedure PT Treatments $Gait Training: 8-22 mins   PT G Codes:        Conni Slipper 10-Aug-2015, 10:24 AM   Conni Slipper, PT, DPT Acute Rehabilitation Services Pager: [phone]

## 2015-07-24 ENCOUNTER — Encounter (HOSPITAL_COMMUNITY): Payer: Self-pay

## 2015-07-24 ENCOUNTER — Encounter (HOSPITAL_COMMUNITY): Payer: Self-pay | Admitting: *Deleted

## 2015-07-24 DIAGNOSIS — F331 Major depressive disorder, recurrent, moderate: Secondary | ICD-10-CM

## 2015-07-24 LAB — CBC
HCT: 33.3 % — ABNORMAL LOW (ref 36.0–46.0)
HEMOGLOBIN: 10.9 g/dL — AB (ref 12.0–15.0)
MCH: 30.1 pg (ref 26.0–34.0)
MCHC: 32.7 g/dL (ref 30.0–36.0)
MCV: 92 fL (ref 78.0–100.0)
PLATELETS: 218 10*3/uL (ref 150–400)
RBC: 3.62 MIL/uL — AB (ref 3.87–5.11)
RDW: 14.3 % (ref 11.5–15.5)
WBC: 9 10*3/uL (ref 4.0–10.5)

## 2015-07-24 LAB — BASIC METABOLIC PANEL WITH GFR
Anion gap: 5 (ref 5–15)
BUN: 13 mg/dL (ref 6–20)
CO2: 23 mmol/L (ref 22–32)
Calcium: 8.2 mg/dL — ABNORMAL LOW (ref 8.9–10.3)
Chloride: 112 mmol/L — ABNORMAL HIGH (ref 101–111)
Creatinine, Ser: 0.65 mg/dL (ref 0.44–1.00)
GFR calc Af Amer: >60 mL/min
GFR calc non Af Amer: >60 mL/min
Glucose, Bld: 89 mg/dL (ref 65–99)
Potassium: 3.9 mmol/L (ref 3.5–5.1)
Sodium: 140 mmol/L (ref 135–145)

## 2015-07-24 MED ORDER — CLONAZEPAM 0.5 MG PO TABS: 0.5000 mg | ORAL_TABLET | Freq: Two times a day (BID) | ORAL | Status: DC | PRN

## 2015-07-24 MED ORDER — BACLOFEN 10 MG PO TABS: 5.0000 mg | ORAL_TABLET | Freq: Three times a day (TID) | ORAL | Status: DC

## 2015-07-24 MED ORDER — PRAVASTATIN SODIUM 10 MG PO TABS: 10.0000 mg | ORAL_TABLET | Freq: Every day | ORAL | Status: DC

## 2015-07-24 MED ORDER — OXYCODONE HCL 10 MG PO TABS: 10.0000 mg | ORAL_TABLET | Freq: Four times a day (QID) | ORAL | Status: DC | PRN

## 2015-07-24 MED ORDER — PRAVASTATIN SODIUM 20 MG PO TABS: 10.0000 mg | ORAL_TABLET | Freq: Every day | ORAL | Status: DC

## 2015-07-24 NOTE — Discharge Summary (Signed)
Physician Discharge Summary   Patient ID: Mary [name] MRN: [id number] DOB/AGE: 05/16/1976 39 y.o.  Admit date: 07/21/2015 Discharge date: 07/24/2015  Primary Care Physician:  [name], NP  Discharge Diagnoses:    . Right sided weakness, psychogenic/conversion disorder  .  Vascular headache . Chronic pancreatitis with chronic pseudocysts . Systemic lupus erythematosus related syndrome (HCC) . Major depressive disorder, recurrent episode, moderate (HCC)   Hyperlipidemia  Consults:  Neurology, Dr Lavon Paganini Psychiatry, Dr Carmelina Dane  Recommendations for Outpatient Follow-up:  1. Patient was recommended home health however per case management, has Medicaid, does not qualify for home health     DIET: Heart healthy diet   Allergies:   Allergies  Allergen Reactions  . Amoxicillin Anaphylaxis  . Latex Rash  . Morphine And Related Anaphylaxis    unknown  . Penicillins Anaphylaxis  . Buprenorphine Other (See Comments)    unknown  . Latex Itching  . Morphine And Related Nausea And Vomiting  . Amoxicillin Rash  . Buprenorphine Hcl Nausea And Vomiting  . Penicillins Rash     DISCHARGE MEDICATIONS: Current Discharge Medication List    START taking these medications   Details  baclofen (LIORESAL) 10 MG tablet Take 0.5 tablets (5 mg total) by mouth 3 (three) times daily. Qty: 90 each, Refills: 3    pravastatin (PRAVACHOL) 10 MG tablet Take 1 tablet (10 mg total) by mouth at bedtime. Qty: 30 tablet, Refills: 3      CONTINUE these medications which have CHANGED   Details  clonazePAM (KLONOPIN) 0.5 MG tablet Take 1 tablet (0.5 mg total) by mouth 2 (two) times daily as needed for anxiety. Qty: 30 tablet, Refills: 0    Oxycodone HCl 10 MG TABS Take 1 tablet (10 mg total) by mouth every 6 (six) hours as needed (severe pain). Qty: 20 tablet, Refills: 0   Associated Diagnoses: Relapsing remitting multiple sclerosis (HCC)      CONTINUE these medications which  have NOT CHANGED   Details  aspirin 81 MG chewable tablet Chew 1 tablet (81 mg total) by mouth daily. Qty: 30 tablet, Refills: 6    azaTHIOprine (IMURAN) 50 MG tablet Take 50 mg by mouth 2 (two) times daily.    cyclobenzaprine (FLEXERIL) 10 MG tablet Take 1 tablet (10 mg total) by mouth 3 (three) times daily as needed for muscle spasms. Qty: 60 tablet, Refills: 4    hydroxychloroquine (PLAQUENIL) 200 MG tablet Take 300 mg by mouth daily.     hydrOXYzine (ATARAX/VISTARIL) 50 MG tablet Take 50 mg by mouth 3 (three) times daily as needed for anxiety or itching.    lipase/protease/amylase (CREON-12/PANCREASE) 12000 UNITS CPEP capsule Take 2 capsules by mouth 3 (three) times daily with meals. Qty: 180 capsule, Refills: 2    promethazine (PHENERGAN) 25 MG tablet TAKE 1 TABLET BY MOUTH EVERY 6 HOURS AS NEEDED FOR NAUSEA/VOMITING Qty: 30 tablet, Refills: 3    sertraline (ZOLOFT) 100 MG tablet Take 150 mg by mouth daily.     topiramate (TOPAMAX) 100 MG tablet Take 50 mg by mouth 2 (two) times daily.      STOP taking these medications     predniSONE (DELTASONE) 10 MG tablet      traMADol (ULTRAM) 50 MG tablet          Brief H and P: For complete details please refer to admission H and P, but in brief Mary [name] is a 39 y.o. female with recent diagnosis of SLE like Syndrome, followed  by Rheumatology at [facility] who presented to ED with complaints of severe sharp persistent headache in the back of her head with associated right sided weakness x 3 days. She has also had stuttering speech and some cognitive changes. Patient was seen by neurology and recommended admission for further workup. CT head was negative for acute findings. Patient was also given IV Solu-Medrol.   Hospital Course:  Right sided weakness, slurred speech and cognitive deficits: Appears to be psychogenic or functional Neurology was called consulted, initially the thought process was possibility of an  autoimmune disorder with CNS involvement versus CADASIL however patient's exam was significantly functional and inconsistent per neurology. - MRI brain negative for acute stroke, moderate to severe stable white matter lesions with imaging characteristics of demyelination though not classic for multiple sclerosis, could represent CADASIL - MRA normal - PTOT evaluation-> no PT follow-up  - Neurology following, exam with functional components. Psychiatry was consulted and recommended outpatient psychiatry treatment and continue Zoloft    Vascular headache - Patient was placed on migraine headache protocol by neurology. Patient is also on chronic narcotics, had to restart to avoid withdrawals. She repeatedly requested to increase Dilaudid and Klonopin, refusing oral pain medications which appears to be a pain medication seeking behavior. Patient was started on Topamax and baclofen by neurology.    Chronic pancreatitis with chronic pseudocysts - Continue Creon   Systemic lupus erythematosus related syndrome (HCC) - Continue Imuran, prednisone    Day of Discharge BP 108/69 mmHg  Pulse 75  Temp(Src) 97.7 F (36.5 C) (Oral)  Resp 16  Ht 5\' 3"  (1.6 m)  Wt 98.657 kg (217 lb 8 oz)  BMI 38.54 kg/m2  SpO2 100%  Physical Exam: General: Alert and awake oriented x3 not in any acute distress, stuttering. HEENT: anicteric sclera, pupils reactive to light and accommodation CVS: S1-S2 clear no murmur rubs or gallops Chest: clear to auscultation bilaterally, no wheezing rales or rhonchi Abdomen: soft nontender, nondistended, normal bowel sounds Extremities: no cyanosis, clubbing or edema noted bilaterally    The results of significant diagnostics from this hospitalization (including imaging, microbiology, ancillary and laboratory) are listed below for reference.    LAB RESULTS: Basic Metabolic Panel:  Recent Labs Lab 07/23/15 0302 07/24/15 0805  NA 141 140  K 3.4* 3.9  CL 112* 112*   CO2 21* 23  GLUCOSE 122* 89  BUN 14 13  CREATININE 0.60 0.65  CALCIUM 8.2* 8.2*   Liver Function Tests:  Recent Labs Lab 07/21/15 1603  AST 16  ALT 11*  ALKPHOS 72  BILITOT 0.5  PROT 8.0  ALBUMIN 4.0   No results for input(s): LIPASE, AMYLASE in the last 168 hours. No results for input(s): AMMONIA in the last 168 hours. CBC:  Recent Labs Lab 07/23/15 0302 07/24/15 0805  WBC 11.5* 9.0  HGB 11.2* 10.9*  HCT 33.5* 33.3*  MCV 92.0 92.0  PLT 266 218   Cardiac Enzymes: No results for input(s): CKTOTAL, CKMB, CKMBINDEX, TROPONINI in the last 168 hours. BNP: Invalid input(s): POCBNP CBG: No results for input(s): GLUCAP in the last 168 hours.  Significant Diagnostic Studies:  Ct Head Wo Contrast  07/21/2015  CLINICAL DATA:  Headaches since Thursday with nausea. Right-sided weakness. EXAM: CT HEAD WITHOUT CONTRAST TECHNIQUE: Contiguous axial images were obtained from the base of the skull through the vertex without intravenous contrast. COMPARISON:  MRI brain 04/22/2015. FINDINGS: Extensive diffuse subcortical white matter hypoattenuation is similar to the prior exams. No acute cortical  infarct is present. The basal ganglia are intact. Insular ribbon is normal bilaterally. The ventricles are of normal size. No significant extra-axial fluid collection is present. No acute hemorrhage or mass lesion is present. The paranasal sinuses and mastoid air cells are clear. The calvarium is intact. Globes and orbits are within normal limits. No focal extracranial soft tissue lesions are present. IMPRESSION: 1. No acute intracranial abnormality or significant interval change. 2. Stable diffuse subcortical white matter disease. Electronically Signed   By: Marin Roberts M.D.   On: 07/21/2015 19:14   Mr Maxine Glenn Head Wo Contrast  07/22/2015  CLINICAL DATA:  Autoimmune disorder versus CADASIL. Few day history of atypical headache followed by dizziness and RIGHT-sided weakness, difficulty  speaking. History of depression, pancreatitis, multiple sclerosis. EXAM: MRI HEAD WITHOUT AND WITH CONTRAST MRA HEAD WITHOUT CONTRAST TECHNIQUE: Multiplanar, multiecho pulse sequences of the brain and surrounding structures were obtained without and with intravenous contrast. Angiographic images of the head were obtained using MRA technique without contrast. CONTRAST:  19mL MULTIHANCE GADOBENATE DIMEGLUMINE 529 MG/ML IV SOLN COMPARISON:  MRI of the head April 22, 2015 and CT head July 21, 2015 FINDINGS: MRI HEAD FINDINGS Multiple sequences are mildly or moderately motion degraded. The ventricles and sulci are normal for patient's age. Patchy to confluent supratentorial and pontine white matter T2 hyperintensities are unchanged, including RIGHT and possibly LEFT temporal lobe involvement though limited assessment due to patient motion. Lesions show low T1 signal suggesting black holes of demyelination. No mass lesions, mass effect. No abnormal parenchymal enhancement. No reduced diffusion to suggest acute ischemia or hyperacute demyelination. No susceptibility artifact to suggest hemorrhage. No abnormal extra-axial fluid collections. No extra-axial masses nor leptomeningeal enhancement. Normal major intracranial vascular flow voids seen at the skull base. Ocular globes and orbital contents are unremarkable though not tailored for evaluation. No suspicious calvarial bone marrow signal. No abnormal sellar expansion. Craniocervical junction maintained. Visualized paranasal sinuses and mastoid air cells are well-aerated. MRA HEAD FINDINGS Anterior circulation: Normal flow related enhancement of the included cervical, petrous, cavernous and supraclinoid internal carotid arteries. Patent anterior communicating artery. Normal flow related enhancement of the anterior and middle cerebral arteries, including distal segments. No large vessel occlusion, high-grade stenosis, abnormal luminal irregularity, aneurysm.  Posterior circulation: Codominant vertebral artery's. Basilar artery is patent, with normal flow related enhancement of the main branch vessels. Normal flow related enhancement of the posterior cerebral arteries. Small bilateral posterior communicating arteries present. No large vessel occlusion, high-grade stenosis, abnormal luminal irregularity, aneurysm. IMPRESSION: MRI HEAD: No acute intracranial process. Moderate to severe stable white matter lesions, with imaging characteristics of demyelination though not classic for multiple sclerosis. Findings could represent CADASIL though, typically demonstrates greater anterior temporal pole involvement. MRA HEAD: Normal. Electronically Signed   By: Awilda Metro M.D.   On: 07/22/2015 02:35   Mr Laqueta Jean WU Contrast  07/22/2015  CLINICAL DATA:  Autoimmune disorder versus CADASIL. Few day history of atypical headache followed by dizziness and RIGHT-sided weakness, difficulty speaking. History of depression, pancreatitis, multiple sclerosis. EXAM: MRI HEAD WITHOUT AND WITH CONTRAST MRA HEAD WITHOUT CONTRAST TECHNIQUE: Multiplanar, multiecho pulse sequences of the brain and surrounding structures were obtained without and with intravenous contrast. Angiographic images of the head were obtained using MRA technique without contrast. CONTRAST:  19mL MULTIHANCE GADOBENATE DIMEGLUMINE 529 MG/ML IV SOLN COMPARISON:  MRI of the head April 22, 2015 and CT head July 21, 2015 FINDINGS: MRI HEAD FINDINGS Multiple sequences are mildly or moderately motion degraded. The ventricles  and sulci are normal for patient's age. Patchy to confluent supratentorial and pontine white matter T2 hyperintensities are unchanged, including RIGHT and possibly LEFT temporal lobe involvement though limited assessment due to patient motion. Lesions show low T1 signal suggesting black holes of demyelination. No mass lesions, mass effect. No abnormal parenchymal enhancement. No reduced diffusion  to suggest acute ischemia or hyperacute demyelination. No susceptibility artifact to suggest hemorrhage. No abnormal extra-axial fluid collections. No extra-axial masses nor leptomeningeal enhancement. Normal major intracranial vascular flow voids seen at the skull base. Ocular globes and orbital contents are unremarkable though not tailored for evaluation. No suspicious calvarial bone marrow signal. No abnormal sellar expansion. Craniocervical junction maintained. Visualized paranasal sinuses and mastoid air cells are well-aerated. MRA HEAD FINDINGS Anterior circulation: Normal flow related enhancement of the included cervical, petrous, cavernous and supraclinoid internal carotid arteries. Patent anterior communicating artery. Normal flow related enhancement of the anterior and middle cerebral arteries, including distal segments. No large vessel occlusion, high-grade stenosis, abnormal luminal irregularity, aneurysm. Posterior circulation: Codominant vertebral artery's. Basilar artery is patent, with normal flow related enhancement of the main branch vessels. Normal flow related enhancement of the posterior cerebral arteries. Small bilateral posterior communicating arteries present. No large vessel occlusion, high-grade stenosis, abnormal luminal irregularity, aneurysm. IMPRESSION: MRI HEAD: No acute intracranial process. Moderate to severe stable white matter lesions, with imaging characteristics of demyelination though not classic for multiple sclerosis. Findings could represent CADASIL though, typically demonstrates greater anterior temporal pole involvement. MRA HEAD: Normal. Electronically Signed   By: Awilda Metro M.D.   On: 07/22/2015 02:35    2D ECHO: Study Conclusions  - Left ventricle: The cavity size was normal. Wall thickness was normal. Systolic function was normal. The estimated ejection fraction was in the range of 55% to 60%. Wall motion was normal; there were no regional wall  motion abnormalities. Left ventricular diastolic function parameters were normal. - Aortic valve: There was no stenosis. - Mitral valve: There was trivial regurgitation. - Right ventricle: The cavity size was normal. Systolic function was normal. - Pulmonary arteries: No complete TR doppler jet so unable to estimate PA systolic pressure. - Systemic veins: IVC not visualized.  Impressions:  - Normal study.  Disposition and Follow-up: Discharge Instructions    Diet - low sodium heart healthy    Complete by:  As directed      Increase activity slowly    Complete by:  As directed             DISPOSITION: Home   DISCHARGE FOLLOW-UP Follow-up Information    Follow up with [name], NP. Schedule an appointment as soon as possible for a visit in 2 weeks.   Specialty:  Nurse Practitioner   Why:  for hospital follow-up   Contact information:   [address] [facility] [place] [address] [phone]        Time spent on Discharge: 35 minutes  Signed:   RAI,RIPUDEEP M.D. [facility] 07/24/2015, 11:12 AM Pager: [phone]

## 2015-07-24 NOTE — Progress Notes (Signed)
Discharge instructions given. Pt verbalized understanding and all questions were answered, however, pt refused to take her prescription for  oxcycodone and klonopin. Pt stated that the pain clinic mange her pain.

## 2015-07-30 ENCOUNTER — Encounter: Payer: Medicaid Other | Attending: Physical Medicine & Rehabilitation | Admitting: Physical Medicine & Rehabilitation

## 2015-07-30 ENCOUNTER — Encounter: Payer: Self-pay | Admitting: Physical Medicine & Rehabilitation

## 2015-07-30 VITALS — BP 108/55 | HR 72 | Resp 14

## 2015-07-30 DIAGNOSIS — K861 Other chronic pancreatitis: Secondary | ICD-10-CM | POA: Diagnosis not present

## 2015-07-30 DIAGNOSIS — M5416 Radiculopathy, lumbar region: Secondary | ICD-10-CM

## 2015-07-30 DIAGNOSIS — G8191 Hemiplegia, unspecified affecting right dominant side: Secondary | ICD-10-CM | POA: Diagnosis not present

## 2015-07-30 DIAGNOSIS — G35 Multiple sclerosis: Secondary | ICD-10-CM

## 2015-07-30 DIAGNOSIS — G894 Chronic pain syndrome: Secondary | ICD-10-CM | POA: Diagnosis present

## 2015-07-30 DIAGNOSIS — M329 Systemic lupus erythematosus, unspecified: Secondary | ICD-10-CM

## 2015-07-30 DIAGNOSIS — G9349 Other encephalopathy: Secondary | ICD-10-CM

## 2015-07-30 DIAGNOSIS — IMO0002 Reserved for concepts with insufficient information to code with codable children: Secondary | ICD-10-CM

## 2015-07-30 DIAGNOSIS — M545 Low back pain: Secondary | ICD-10-CM | POA: Insufficient documentation

## 2015-07-30 MED ORDER — OXYCODONE HCL 10 MG PO TABS: 10.0000 mg | ORAL_TABLET | Freq: Four times a day (QID) | ORAL | Status: DC | PRN

## 2015-07-30 NOTE — Patient Instructions (Signed)
PLEASE CALL ME WITH ANY PROBLEMS OR QUESTIONS (#[phone]). HAVE A HAPPY HOLIDAY SEASON!!!   I WILL LOOK FOR A PSYCHIATRY MD FOR YOU SEE

## 2015-07-30 NOTE — Progress Notes (Signed)
Subjective:    Patient ID: Mary [name], female    DOB: 1976/02/17, 39 y.o.   MRN: [id number]  HPI  Mary [name] is here in follow up of her chronic pain. She was admitted for a flare of her weakness and cognitive/langage deficits. She is now being treated by rheumatology for SLE. Her MRI during this most recent admit showed chronic WM lesions but nothing acute. It was felt that her weakness was likely non-organic and an outpt psychiatry consult was recommended  Pain Inventory Average Pain 6 Pain Right Now 7 My pain is sharp, burning, stabbing and tingling  In the last 24 hours, has pain interfered with the following? General activity 7 Relation with others 5 Enjoyment of life 9 What TIME of day is your pain at its worst? evening,night  Sleep (in general) Poor  Pain is worse with: walking and standing Pain improves with: medication Relief from Meds: 8  Mobility walk without assistance walk with assistance use a cane use a walker how many minutes can you walk? very little do you drive?  no  Function disabled: date disabled .  Neuro/Psych weakness numbness tingling trouble walking dizziness  Prior Studies Any changes since last visit?  no  Physicians involved in your care Any changes since last visit?  no   Family History  Problem Relation Age of Onset  . Lupus Neg Hx   . Sarcoidosis Neg Hx   . Pancreatitis Neg Hx   . Ataxia Neg Hx   . Chorea Neg Hx   . Dementia Neg Hx   . Mental retardation Neg Hx   . Migraines Neg Hx   . Multiple sclerosis Neg Hx   . Neurofibromatosis Neg Hx   . Neuropathy Neg Hx   . Parkinsonism Neg Hx   . Seizures Neg Hx   . Stroke Neg Hx   . Colitis Maternal Aunt   . Diabetes Mother   . Diabetes Father   . Diabetes      many family members   Social History   Social History  . Marital Status: Single    Spouse Name: N/A  . Number of Children: N/A  . Years of Education: N/A   Social History Main Topics  . Smoking status:  Former Smoker -- 0.50 packs/day for 20 years    Types: Cigarettes    Quit date: 05/22/2013  . Smokeless tobacco: Never Used  . Alcohol Use: No  . Drug Use: No  . Sexual Activity: No   Other Topics Concern  . None   Social History Narrative   ** Merged History Encounter **       Lives permanently in [place] with mom and stepfather.  Has not started working yet and was unemployed in [place].            Past Surgical History  Procedure Laterality Date  . Abdominal surgery  ~ 2007    some sort of pancreatic cyst drainage.   . Cholecystectomy      ~ age 20-laparoscopic  . Adenoidectomy    . 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 Eus N/A 04/14/2013    Procedure: UPPER ENDOSCOPIC ULTRASOUND (EUS) LINEAR;  Surgeon: Rachael Fee, MD;  Location: [facility];  Service: Endoscopy;  Laterality: N/A;  . Abdominal surgery    . Roux-en-y gastric bypass    . Cholecystectomy     Past Medical History  Diagnosis Date  .  Hernia 03-24-13    ventral hernia remains   . Pancreatitis 03-24-13    2002, 2'2013, 03-14-13  . Pancreatic cyst 2002 onset  . Depression     "recent breakup with partner of 15 yrs"  . Adrenal insufficiency (HCC) 04/23/2013    ??  . MS (multiple sclerosis) (HCC)   . Ventral hernia   . GERD (gastroesophageal reflux disease)   . Depression   . Anxiety   . Abdominal hernia    BP 108/55 mmHg  Pulse 72  Resp 14  SpO2 97%  Opioid Risk Score:   Fall Risk Score:  `1  Depression screen PHQ 2/9  Depression screen [facility] 2/9 05/21/2015 11/01/2014  Decreased Interest 2 2  Down, Depressed, Hopeless - 2  PHQ - 2 Score 2 4  Altered sleeping - 1  Tired, decreased energy - 1  Change in appetite - 3  Feeling bad or failure about yourself  - 0  Trouble concentrating - 1  Moving slowly or fidgety/restless - 0  PHQ-9 Score - 10     Review of Systems  Constitutional: Positive for unexpected weight change.  Gastrointestinal:  Positive for nausea and vomiting.  Neurological: Positive for dizziness, weakness and numbness.       Tingling   All other systems reviewed and are negative.      Objective:   Physical Exam  General: Alert and oriented x 3, No apparent distress. obese  HEENT: Head is normocephalic, atraumatic, PERRLA, EOMI, sclera anicteric, oral mucosa pink and moist, dentition intact, ext ear canals clear,  Neck: Supple without JVD or lymphadenopathy  Heart: Reg rate and rhythm. No murmurs rubs or gallops  Chest: CTA bilaterally without wheezes, rales, or rhonchi; no distress  Abdomen: Soft, non-tender, non-distended, bowel sounds positive.  Extremities: No clubbing, cyanosis, or edema. Pulses are 2+  Skin: Clean and intact without signs of breakdown  Neuro: Pt is cognitively appropriate with normal insight and awareness. Sometimes memory and attention can be limited. Cranial nerves 2-12 are intact. Sensory exam is slightly decreased on the right. Reflexes are 3+ right and 2+ left. Strength remains 3-4/5 rue prox to distal---inconsistent/cog wheeling. 3+ to 4-/5 RLE in all muscle groups in inconsistent pattern with difficulties coordinating movement at times. Fine motor coordination is decreased RLE and RUE. She walks with antalgia on the right leg today.  Musculoskeletal: low back with better ROM. She has tenderness in the upper cervical spine. Pain is worst with flexion and side bending. Extension seems to relieve the pain. She has mild spasm and pain with palpation bilaterally along the upper paraspinals.  Psych: Pt's affect is appropriate. Pt is cooperative   Assessment & Plan:   1. Non-specific bicerebral/brainstem white matter disease---initially suspected to be MS, but not characteristic in presentation. May in fact be a vasculitic process, perhaps an intracranial manifestation of SLE---apparently her labwork is reflective of an inflammatory process, however her exam and history is not as consistent.  An inflammatory syndrome also could explain her repsonse to steroids.  2. Chronic pain syndrome related to above with primarily neuropathic right sided pain. She has had a recent increase in cervical pain and related headaches since her hospitalization early last month.  3. Low back pain with ? Radiculopathy RLE  4. Chronic pancreatitis    Plan:  1. Continue topamax  am and  pm for headaches and neuropathic pain.  2. Agree with psychiatric assessment. Need to rule out conversion reaction/syndrome. Will see if we can set her  up with someone here in town. Had a discussion with the patient regarding this possiblity today. She agrees. 3. Oxycodone was refilled. #120  with a second rx for next month. 4. Discussed appropriate activity levels, posture, scheduled exercise once again  5. Continue with prn AFO for gait/support as possible.  6. Pancreatitis management per PCP and gastroenterologist.  7. Rheumatology appts pending. Hopefully they will shed some light on this situation  8. Follow up with [name] in about 2 months with [name] or NP. of face to face patient care time were spent during this visit. All questions were encouraged and answered.

## 2015-08-08 ENCOUNTER — Other Ambulatory Visit: Payer: Self-pay | Admitting: Physical Medicine & Rehabilitation

## 2015-08-08 MED ORDER — TOPIRAMATE 100 MG PO TABS: ORAL_TABLET | ORAL | Status: DC

## 2015-08-19 HISTORY — PX: HERNIA REPAIR: SHX51

## 2015-10-01 ENCOUNTER — Encounter: Payer: Self-pay | Admitting: Physical Medicine & Rehabilitation

## 2015-10-01 ENCOUNTER — Encounter: Payer: Medicaid Other | Attending: Physical Medicine & Rehabilitation | Admitting: Physical Medicine & Rehabilitation

## 2015-10-01 VITALS — BP 124/74 | HR 88

## 2015-10-01 DIAGNOSIS — K861 Other chronic pancreatitis: Secondary | ICD-10-CM | POA: Diagnosis not present

## 2015-10-01 DIAGNOSIS — Z5181 Encounter for therapeutic drug level monitoring: Secondary | ICD-10-CM | POA: Diagnosis not present

## 2015-10-01 DIAGNOSIS — Z79899 Other long term (current) drug therapy: Secondary | ICD-10-CM

## 2015-10-01 DIAGNOSIS — M545 Low back pain: Secondary | ICD-10-CM | POA: Diagnosis not present

## 2015-10-01 DIAGNOSIS — G894 Chronic pain syndrome: Secondary | ICD-10-CM | POA: Diagnosis present

## 2015-10-01 DIAGNOSIS — G35A Relapsing-remitting multiple sclerosis: Secondary | ICD-10-CM

## 2015-10-01 DIAGNOSIS — G35D Multiple sclerosis, unspecified: Secondary | ICD-10-CM

## 2015-10-01 DIAGNOSIS — M329 Systemic lupus erythematosus, unspecified: Secondary | ICD-10-CM

## 2015-10-01 DIAGNOSIS — R531 Weakness: Secondary | ICD-10-CM

## 2015-10-01 DIAGNOSIS — M6289 Other specified disorders of muscle: Secondary | ICD-10-CM

## 2015-10-01 DIAGNOSIS — G35 Multiple sclerosis: Secondary | ICD-10-CM | POA: Diagnosis not present

## 2015-10-01 MED ORDER — OXYCODONE HCL 10 MG PO TABS: 10.0000 mg | ORAL_TABLET | Freq: Four times a day (QID) | ORAL | Status: DC | PRN

## 2015-10-01 MED ORDER — METHOCARBAMOL 500 MG PO TABS
500.0000 mg | ORAL_TABLET | Freq: Four times a day (QID) | ORAL | Status: DC | PRN
Start: 2015-10-01 — End: 2016-02-20

## 2015-10-01 MED ORDER — TOPIRAMATE 100 MG PO TABS: 50.0000 mg | ORAL_TABLET | Freq: Two times a day (BID) | ORAL | Status: DC

## 2015-10-01 NOTE — Progress Notes (Signed)
Subjective:    Patient ID: Mary [name], female    DOB: 04-22-1976, 40 y.o.   MRN: [id number]  HPI   Mary [name] is here in follow up of her chronic pain. She had to cancel her psychiatry appointment but has been struggling to get a re-schedule since then.   From a rheumatology standpoint, her prednisone is down to 15mg  daily. She continues with the imuran and plaquenil. She doesn't feel that any of these changes have helped her pain. The baclofen doesn't do much for her and the flexeril only provides modest relief. She tried a friend's robaxin which seem to help.   From an activity standpoint, she has been active around home, doing some house work. She likes to play with her dog. She doesn't exercise much else otherwise. She does do some walking on the weekends when she shops with friends.   She is taking oxycodone for breakthrough pain. She has been trying to cut back until she fell on the ice a couple weeks ago. She had been taking two per day prior.       Pain Inventory Average Pain 8 Pain Right Now 6 My pain is no answer  In the last 24 hours, has pain interfered with the following? General activity 8 Relation with others 2 Enjoyment of life 8 What TIME of day is your pain at its worst? all Sleep (in general) Poor  Pain is worse with: walking, bending, sitting and standing Pain improves with: medication Relief from Meds: not answered  Mobility walk without assistance walk with assistance use a cane use a walker do you drive?  no  Function disabled: date disabled .  Neuro/Psych weakness numbness tingling spasms confusion  Prior Studies Any changes since last visit?  no  Physicians involved in your care Any changes since last visit?  no   Family History  Problem Relation Age of Onset  . Lupus Neg Hx   . Sarcoidosis Neg Hx   . Pancreatitis Neg Hx   . Ataxia Neg Hx   . Chorea Neg Hx   . Dementia Neg Hx   . Mental retardation Neg Hx   . Migraines Neg  Hx   . Multiple sclerosis Neg Hx   . Neurofibromatosis Neg Hx   . Neuropathy Neg Hx   . Parkinsonism Neg Hx   . Seizures Neg Hx   . Stroke Neg Hx   . Colitis Maternal Aunt   . Diabetes Mother   . Diabetes Father   . Diabetes      many family members   Social History   Social History  . Marital Status: Single    Spouse Name: N/A  . Number of Children: N/A  . Years of Education: N/A   Social History Main Topics  . Smoking status: Former Smoker -- 0.50 packs/day for 20 years    Types: Cigarettes    Quit date: 05/22/2013  . Smokeless tobacco: Never Used  . Alcohol Use: No  . Drug Use: No  . Sexual Activity: No   Other Topics Concern  . None   Social History Narrative   ** Merged History Encounter **       Lives permanently in [place] with mom and stepfather.  Has not started working yet and was unemployed in [place].            Past Surgical History  Procedure Laterality Date  . Abdominal surgery  ~ 2007    some sort of pancreatic cyst drainage.   Marland Kitchen  Cholecystectomy      ~ age 14-laparoscopic  . Adenoidectomy    . 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 Eus N/A 04/14/2013    Procedure: UPPER ENDOSCOPIC ULTRASOUND (EUS) LINEAR;  Surgeon: Rachael Fee, MD;  Location: [facility];  Service: Endoscopy;  Laterality: N/A;  . Abdominal surgery    . Roux-en-y gastric bypass    . Cholecystectomy     Past Medical History  Diagnosis Date  . Hernia 03-24-13    ventral hernia remains   . Pancreatitis 03-24-13    2002, 2'2013, 03-14-13  . Pancreatic cyst 2002 onset  . Depression     "recent breakup with partner of 15 yrs"  . Adrenal insufficiency (HCC) 04/23/2013    ??  . MS (multiple sclerosis) (HCC)   . Ventral hernia   . GERD (gastroesophageal reflux disease)   . Depression   . Anxiety   . Abdominal hernia    BP 124/74 mmHg  Pulse 88  SpO2 98%  Opioid Risk Score:   Fall Risk Score:  `1  Depression screen PHQ  2/9  Depression screen [facility] 2/9 10/01/2015 05/21/2015 11/01/2014  Decreased Interest Down, Depressed, Hopeless 2 - 2  PHQ - 2 Score Altered sleeping - - 1  Tired, decreased energy - - 1  Change in appetite - - 3  Feeling bad or failure about yourself  - - 0  Trouble concentrating - - 1  Moving slowly or fidgety/restless - - 0  PHQ-9 Score - - 10     Review of Systems  Cardiovascular: Positive for leg swelling.  All other systems reviewed and are negative.      Objective:   Physical Exam  General: Alert and oriented x 3, No apparent distress. obese  HEENT: Head is normocephalic, atraumatic, PERRLA, EOMI, sclera anicteric, oral mucosa pink and moist, dentition intact, ext ear canals clear,  Neck: Supple without JVD or lymphadenopathy  Heart: Reg rate and rhythm. No murmurs rubs or gallops  Chest: CTA bilaterally without wheezes, rales, or rhonchi; no distress  Abdomen: Soft, non-tender, non-distended, bowel sounds positive.  Extremities: No clubbing, cyanosis, or edema. Pulses are 2+  Skin: Clean and intact without signs of breakdown  Neuro: Pt is cognitively appropriate with normal insight and awareness. Memory and focus can be hit or miss. Cranial nerves 2-12 are intact. Sensory exam is slightly decreased on the right. Reflexes are 3+ right and 2+ left. . She walks with antalgia on both sides today. Gait wide based.   Musculoskeletal: has lumbar and cervical pain with flexion and extension today.  Psych: Pt's affect is appropriate. Pt is cooperative    Assessment & Plan:   1. Non-specific bicerebral/brainstem white matter disease---initially suspected to be MS, but not characteristic in presentation. May in fact be a vasculitic process, perhaps an intracranial manifestation of SLE---apparently her labwork is reflective of an inflammatory process, however her exam and history is not as consistent. An inflammatory syndrome also could explain her repsonse to steroids.    2. Chronic pain syndrome related to above with primarily neuropathic right sided pain. She has had a recent increase in cervical pain and related headaches since her hospitalization early last month.  3. Low back pain with ? Radiculopathy RLE  4. Chronic pancreatitis    Plan:  1. Continue topamax  am and  pm for headaches and neuropathic pain.  Wrote for 60 per month to give some flexibility with dosing  2. Needs psychiatric assessment. Need to rule out conversion reaction/syndrome. Will see if we can set her up with someone here in town. Had a discussion with the patient regarding this possiblity today.    3. Oxycodone was refilled. #120 10mg .  4. Discussed appropriate activity levels, posture, scheduled exercise once again---needs to be regular 5. Continue with prn AFO for gait/support as possible.  6. Will dc flexeril and baclofen---begin trial of robaxin 500mg  q6 prn #75, RF.  7. Rheumatology plan as above.  8. Follow up with [name] in about 3 months with [name] or NP. of face to face patient care time were spent during this visit. All questions were encouraged and answered.

## 2015-10-01 NOTE — Patient Instructions (Signed)
  PLEASE CALL ME WITH ANY PROBLEMS OR QUESTIONS (#[phone]).      

## 2015-10-07 LAB — TOXASSURE SELECT,+ANTIDEPR,UR: PDF: 0

## 2015-10-08 NOTE — Progress Notes (Signed)
Urine drug screen for this encounter is consistent for prescribed medication 

## 2015-11-20 ENCOUNTER — Other Ambulatory Visit: Payer: Self-pay | Admitting: Gastroenterology

## 2015-11-20 ENCOUNTER — Telehealth: Payer: Self-pay | Admitting: *Deleted

## 2015-11-20 ENCOUNTER — Other Ambulatory Visit: Payer: Self-pay | Admitting: *Deleted

## 2015-11-20 MED ORDER — PANTOPRAZOLE SODIUM 40 MG PO TBEC: 40.0000 mg | DELAYED_RELEASE_TABLET | Freq: Every day | ORAL | Status: DC

## 2015-11-20 NOTE — Telephone Encounter (Signed)
LM for the patient advising we got a prescription request from [facility], [place].  The medication is Pantoprazole Sodium 40 mg, 1 tablet daily. Lm asking her to call me to let me know if she is still taking i?  I advised her when I look at the medication list we have for her in Epic,  This medication is not on her most current list.

## 2015-11-20 NOTE — Telephone Encounter (Signed)
I called the patient to advise we will send # [phone] with 3 refills for the pantoprazole sodium 40 mg.  She must call our office and make an appointment to be seen before refills run out.

## 2015-11-22 ENCOUNTER — Telehealth: Payer: Self-pay

## 2015-11-22 DIAGNOSIS — G35 Multiple sclerosis: Secondary | ICD-10-CM

## 2015-11-22 DIAGNOSIS — G894 Chronic pain syndrome: Secondary | ICD-10-CM

## 2015-11-22 DIAGNOSIS — Z79899 Other long term (current) drug therapy: Secondary | ICD-10-CM

## 2015-11-22 DIAGNOSIS — R531 Weakness: Secondary | ICD-10-CM

## 2015-11-22 DIAGNOSIS — Z5181 Encounter for therapeutic drug level monitoring: Secondary | ICD-10-CM

## 2015-11-22 DIAGNOSIS — M329 Systemic lupus erythematosus, unspecified: Secondary | ICD-10-CM

## 2015-11-22 MED ORDER — OXYCODONE HCL 10 MG PO TABS: 10.0000 mg | ORAL_TABLET | Freq: Four times a day (QID) | ORAL | Status: DC | PRN

## 2015-11-22 NOTE — Telephone Encounter (Signed)
Pt states that ZS did not sign her second Oxycodone script. She turned it into the pharmacy, and they will not refill it without a signature.Would it be okay for you to print out script and sign for her since ZS will not be back in office until Monday? Pt is completely out of medication. Please advise. She would like her mother, [name], to pick it up tomorrow.

## 2015-11-22 NOTE — Telephone Encounter (Signed)
Ms. Tooks tried to fill her Oxycodone prescription, the pharmacist notices it wasn't signed.  Reviewed [facility] her last prescription of Oxycodone was filled on 10/01/15. I will print a new prescription for her mother to pick up tomorrow. Office staff informed Ms. Covault she has to return the old prescription she verbalizes understanding.

## 2015-11-29 ENCOUNTER — Inpatient Hospital Stay (HOSPITAL_COMMUNITY)
Admission: EM | Admit: 2015-11-29 | Discharge: 2015-12-05 | DRG: 057 | Disposition: A | Discharge status: Home / Self Care | Payer: Medicaid Other | Attending: Internal Medicine | Admitting: Internal Medicine

## 2015-11-29 ENCOUNTER — Encounter (HOSPITAL_COMMUNITY): Payer: Self-pay

## 2015-11-29 DIAGNOSIS — K219 Gastro-esophageal reflux disease without esophagitis: Secondary | ICD-10-CM | POA: Diagnosis present

## 2015-11-29 DIAGNOSIS — Z9884 Bariatric surgery status: Secondary | ICD-10-CM

## 2015-11-29 DIAGNOSIS — R4781 Slurred speech: Secondary | ICD-10-CM | POA: Diagnosis present

## 2015-11-29 DIAGNOSIS — K909 Intestinal malabsorption, unspecified: Secondary | ICD-10-CM | POA: Diagnosis present

## 2015-11-29 DIAGNOSIS — F4010 Social phobia, unspecified: Secondary | ICD-10-CM | POA: Diagnosis present

## 2015-11-29 DIAGNOSIS — Z886 Allergy status to analgesic agent status: Secondary | ICD-10-CM

## 2015-11-29 DIAGNOSIS — Z7952 Long term (current) use of systemic steroids: Secondary | ICD-10-CM

## 2015-11-29 DIAGNOSIS — R413 Other amnesia: Secondary | ICD-10-CM | POA: Diagnosis not present

## 2015-11-29 DIAGNOSIS — M329 Systemic lupus erythematosus, unspecified: Secondary | ICD-10-CM | POA: Diagnosis present

## 2015-11-29 DIAGNOSIS — R519 Headache, unspecified: Secondary | ICD-10-CM

## 2015-11-29 DIAGNOSIS — K861 Other chronic pancreatitis: Secondary | ICD-10-CM | POA: Diagnosis present

## 2015-11-29 DIAGNOSIS — R51 Headache: Secondary | ICD-10-CM | POA: Diagnosis present

## 2015-11-29 DIAGNOSIS — F459 Somatoform disorder, unspecified: Secondary | ICD-10-CM | POA: Diagnosis present

## 2015-11-29 DIAGNOSIS — Z79899 Other long term (current) drug therapy: Secondary | ICD-10-CM

## 2015-11-29 DIAGNOSIS — Z87891 Personal history of nicotine dependence: Secondary | ICD-10-CM

## 2015-11-29 DIAGNOSIS — Z7982 Long term (current) use of aspirin: Secondary | ICD-10-CM

## 2015-11-29 DIAGNOSIS — I6789 Other cerebrovascular disease: Secondary | ICD-10-CM | POA: Diagnosis not present

## 2015-11-29 DIAGNOSIS — G8191 Hemiplegia, unspecified affecting right dominant side: Secondary | ICD-10-CM | POA: Diagnosis not present

## 2015-11-29 DIAGNOSIS — R531 Weakness: Secondary | ICD-10-CM

## 2015-11-29 DIAGNOSIS — Z881 Allergy status to other antibiotic agents status: Secondary | ICD-10-CM

## 2015-11-29 DIAGNOSIS — E876 Hypokalemia: Secondary | ICD-10-CM | POA: Diagnosis present

## 2015-11-29 DIAGNOSIS — R29898 Other symptoms and signs involving the musculoskeletal system: Secondary | ICD-10-CM | POA: Insufficient documentation

## 2015-11-29 DIAGNOSIS — R2990 Unspecified symptoms and signs involving the nervous system: Secondary | ICD-10-CM | POA: Insufficient documentation

## 2015-11-29 DIAGNOSIS — I67850 Cerebral autosomal dominant arteriopathy with subcortical infarcts and leukoencephalopathy: Secondary | ICD-10-CM

## 2015-11-29 DIAGNOSIS — F449 Dissociative and conversion disorder, unspecified: Secondary | ICD-10-CM | POA: Diagnosis present

## 2015-11-29 DIAGNOSIS — L818 Other specified disorders of pigmentation: Secondary | ICD-10-CM | POA: Diagnosis not present

## 2015-11-29 DIAGNOSIS — E538 Deficiency of other specified B group vitamins: Secondary | ICD-10-CM | POA: Diagnosis present

## 2015-11-29 LAB — COMPREHENSIVE METABOLIC PANEL
ALK PHOS: 85 U/L (ref 38–126)
ALT: 11 U/L — ABNORMAL LOW (ref 14–54)
ANION GAP: 13 (ref 5–15)
AST: 17 U/L (ref 15–41)
Albumin: 3.9 g/dL (ref 3.5–5.0)
BUN: 11 mg/dL (ref 6–20)
CALCIUM: 9 mg/dL (ref 8.9–10.3)
CHLORIDE: 112 mmol/L — AB (ref 101–111)
CO2: 16 mmol/L — ABNORMAL LOW (ref 22–32)
CREATININE: 0.69 mg/dL (ref 0.44–1.00)
Glucose, Bld: 167 mg/dL — ABNORMAL HIGH (ref 65–99)
Potassium: 3.9 mmol/L (ref 3.5–5.1)
SODIUM: 141 mmol/L (ref 135–145)
Total Bilirubin: 0.5 mg/dL (ref 0.3–1.2)
Total Protein: 7.6 g/dL (ref 6.5–8.1)

## 2015-11-29 LAB — CBC
HEMATOCRIT: 38.9 % (ref 36.0–46.0)
Hemoglobin: 12.7 g/dL (ref 12.0–15.0)
MCH: 30.1 pg (ref 26.0–34.0)
MCHC: 32.6 g/dL (ref 30.0–36.0)
MCV: 92.2 fL (ref 78.0–100.0)
PLATELETS: 339 10*3/uL (ref 150–400)
RBC: 4.22 MIL/uL (ref 3.87–5.11)
RDW: 14.1 % (ref 11.5–15.5)
WBC: 10.2 10*3/uL (ref 4.0–10.5)

## 2015-11-29 LAB — PROTIME-INR
INR: 1.08 (ref 0.00–1.49)
PROTHROMBIN TIME: 14.2 s (ref 11.6–15.2)

## 2015-11-29 LAB — DIFFERENTIAL
BASOS PCT: 0 %
Basophils Absolute: 0 10*3/uL (ref 0.0–0.1)
EOS PCT: 0 %
Eosinophils Absolute: 0 10*3/uL (ref 0.0–0.7)
Lymphocytes Relative: 10 %
Lymphs Abs: 1 10*3/uL (ref 0.7–4.0)
MONO ABS: 0.3 10*3/uL (ref 0.1–1.0)
MONOS PCT: 3 %
NEUTROS ABS: 8.8 10*3/uL — AB (ref 1.7–7.7)
Neutrophils Relative %: 87 %

## 2015-11-29 LAB — I-STAT TROPONIN, ED: TROPONIN I, POC: 0 ng/mL (ref 0.00–0.08)

## 2015-11-29 LAB — CBG MONITORING, ED: Glucose-Capillary: 153 mg/dL — ABNORMAL HIGH (ref 65–99)

## 2015-11-29 LAB — APTT: aPTT: 26 seconds (ref 24–37)

## 2015-11-29 MED ORDER — ASPIRIN 81 MG PO CHEW
81.0000 mg | CHEWABLE_TABLET | Freq: Every day | ORAL | Status: DC
  Administered 2015-11-29 – 2015-12-05 (×7): 81 mg via ORAL
  Filled 2015-11-29 (×7): qty 1

## 2015-11-29 MED ORDER — HYDROMORPHONE HCL 1 MG/ML IJ SOLN
1.0000 mg | Freq: Once | INTRAMUSCULAR | Status: AC
  Administered 2015-11-29: 1 mg via INTRAVENOUS
  Filled 2015-11-29: qty 1

## 2015-11-29 MED ORDER — HYDROXYCHLOROQUINE SULFATE 200 MG PO TABS
300.0000 mg | ORAL_TABLET | Freq: Every day | ORAL | Status: DC
  Administered 2015-11-29 – 2015-12-05 (×7): 300 mg via ORAL
  Filled 2015-11-29 (×7): qty 2

## 2015-11-29 MED ORDER — ENOXAPARIN SODIUM 40 MG/0.4ML ~~LOC~~ SOLN
40.0000 mg | SUBCUTANEOUS | Status: DC
  Administered 2015-11-29 – 2015-12-04 (×3): 40 mg via SUBCUTANEOUS
  Filled 2015-11-29 (×6): qty 0.4

## 2015-11-29 MED ORDER — METHYLPREDNISOLONE SODIUM SUCC 125 MG IJ SOLR
125.0000 mg | Freq: Every day | INTRAMUSCULAR | Status: DC
Start: 2015-11-30 — End: 2015-12-05
  Administered 2015-11-30 – 2015-12-04 (×5): 125 mg via INTRAVENOUS
  Filled 2015-11-29 (×5): qty 2

## 2015-11-29 MED ORDER — SERTRALINE HCL 100 MG PO TABS
200.0000 mg | ORAL_TABLET | Freq: Every day | ORAL | Status: DC
  Administered 2015-11-29 – 2015-12-05 (×7): 200 mg via ORAL
  Filled 2015-11-29 (×7): qty 2

## 2015-11-29 MED ORDER — METHOCARBAMOL 500 MG PO TABS
500.0000 mg | ORAL_TABLET | Freq: Four times a day (QID) | ORAL | Status: DC | PRN
  Administered 2015-12-01 – 2015-12-05 (×8): 500 mg via ORAL
  Filled 2015-11-29 (×8): qty 1

## 2015-11-29 MED ORDER — AZATHIOPRINE 50 MG PO TABS
50.0000 mg | ORAL_TABLET | Freq: Two times a day (BID) | ORAL | Status: DC
  Administered 2015-11-29 – 2015-11-30 (×2): 50 mg via ORAL
  Filled 2015-11-29 (×2): qty 1

## 2015-11-29 MED ORDER — PANTOPRAZOLE SODIUM 40 MG PO TBEC
40.0000 mg | DELAYED_RELEASE_TABLET | Freq: Every day | ORAL | Status: DC
  Administered 2015-11-29 – 2015-12-05 (×7): 40 mg via ORAL
  Filled 2015-11-29 (×7): qty 1

## 2015-11-29 MED ORDER — OXYCODONE HCL 5 MG PO TABS
10.0000 mg | ORAL_TABLET | Freq: Four times a day (QID) | ORAL | Status: DC | PRN
  Administered 2015-11-29 – 2015-12-01 (×6): 10 mg via ORAL
  Filled 2015-11-29 (×6): qty 2

## 2015-11-29 MED ORDER — CLONAZEPAM 1 MG PO TABS
1.0000 mg | ORAL_TABLET | Freq: Two times a day (BID) | ORAL | Status: DC | PRN
  Administered 2015-11-29 – 2015-12-04 (×7): 1 mg via ORAL
  Filled 2015-11-29 (×9): qty 1

## 2015-11-29 MED ORDER — METHYLPREDNISOLONE SODIUM SUCC 125 MG IJ SOLR
125.0000 mg | Freq: Once | INTRAMUSCULAR | Status: AC
  Administered 2015-11-29: 125 mg via INTRAVENOUS
  Filled 2015-11-29: qty 2

## 2015-11-29 MED ORDER — OXYCODONE HCL 5 MG PO TABS
5.0000 mg | ORAL_TABLET | Freq: Once | ORAL | Status: AC
  Administered 2015-11-29: 5 mg via ORAL
  Filled 2015-11-29: qty 1

## 2015-11-29 MED ORDER — PRAVASTATIN SODIUM 10 MG PO TABS: 10.0000 mg | ORAL_TABLET | Freq: Every day | ORAL | Status: DC

## 2015-11-29 MED ORDER — PANCRELIPASE (LIP-PROT-AMYL) 12000-38000 UNITS PO CPEP
2.0000 | ORAL_CAPSULE | Freq: Three times a day (TID) | ORAL | Status: DC
  Administered 2015-11-30 – 2015-12-05 (×17): 24000 [IU] via ORAL
  Filled 2015-11-29 (×19): qty 2

## 2015-11-29 NOTE — ED Notes (Addendum)
Patient here with right sided weakness and confusion. Family member reports that patient has had multiple episodes of same in past and due to her auto immune issues she is normally started on prednisone. She has specialist in [place] Dr. Sharmon Revere who sees her for same. She also has some facial drooping that Dad reports is better than normal

## 2015-11-29 NOTE — ED Provider Notes (Signed)
CSN: [id number]     Arrival date & time 11/29/15  1319 History   First MD Initiated Contact with Patient 11/29/15 1609     Chief Complaint  Patient presents with  . Altered Mental Status     (Consider location/radiation/quality/duration/timing/severity/associated sxs/prior Treatment) HPI Comments: 40 year old female with, located past medical history including autoimmune disease possibly SLE, multiple episodes of weakness and facial droop in the past presents with her family for what they're calling in attack. They report that last night she had an attack where she was having worsening headache as well as they believe right-sided weakness. She did miss her medications yesterday as she was visiting her brother. Today she had another attack where she had right-sided weakness as well as right-sided facial droop. They said this is typical of her previous attacks. They said that she also has been complaining of a worsened headache. She always has a headache. Today she has also had problems with her memory. Where at times she was unable to her remember events from last night. Her father says that when these attacks happen she always ends up needing admission and she gets started on high-dose steroids. I discussed the patient on the phone with Dr. Sharmon Revere her rheumatologist who said that at this time she believed that the patient should be started on high-dose steroids admitted to the hospital as this is similar to her previous events. Previous workup for stroke was negative and her rheumatologist felt that treating her with steroids initially at this time was the best course.   Past Medical History  Diagnosis Date  . Hernia 03-24-13    ventral hernia remains   . Pancreatitis 03-24-13    2002, 2'2013, 03-14-13  . Pancreatic cyst 2002 onset  . Depression     "recent breakup with partner of 15 yrs"  . Adrenal insufficiency (HCC) 04/23/2013    ??  . MS (multiple sclerosis) (HCC)   . Ventral hernia   .  GERD (gastroesophageal reflux disease)   . Depression   . Anxiety   . Abdominal hernia    Past Surgical History  Procedure Laterality Date  . Abdominal surgery  ~ 2007    some sort of pancreatic cyst drainage.   . Cholecystectomy      ~ age 67-laparoscopic  . Adenoidectomy    . 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 Eus N/A 04/14/2013    Procedure: UPPER ENDOSCOPIC ULTRASOUND (EUS) LINEAR;  Surgeon: Rachael Fee, MD;  Location: [facility];  Service: Endoscopy;  Laterality: N/A;  . Abdominal surgery    . Roux-en-y gastric bypass    . Cholecystectomy     Family History  Problem Relation Age of Onset  . Lupus Neg Hx   . Sarcoidosis Neg Hx   . Pancreatitis Neg Hx   . Ataxia Neg Hx   . Chorea Neg Hx   . Dementia Neg Hx   . Mental retardation Neg Hx   . Migraines Neg Hx   . Multiple sclerosis Neg Hx   . Neurofibromatosis Neg Hx   . Neuropathy Neg Hx   . Parkinsonism Neg Hx   . Seizures Neg Hx   . Stroke Neg Hx   . Colitis Maternal Aunt   . Diabetes Mother   . Diabetes Father   . Diabetes      many family members   Social History  Substance Use Topics  . Smoking status:  Former Smoker -- 0.50 packs/day for 20 years    Types: Cigarettes    Quit date: 05/22/2013  . Smokeless tobacco: Never Used  . Alcohol Use: No   OB History    Gravida Para Term Preterm AB TAB SAB Ectopic Multiple Living   0 0 0 0 0 0 0 0       Review of Systems  Constitutional: Negative for fever and chills.  HENT: Negative for congestion, postnasal drip and sore throat.   Eyes: Negative for visual disturbance.  Respiratory: Negative for cough, chest tightness and shortness of breath.   Cardiovascular: Negative for chest pain and palpitations.  Gastrointestinal: Negative for nausea, vomiting, abdominal pain and diarrhea.  Genitourinary: Negative for dysuria, urgency and frequency.  Musculoskeletal: Negative for myalgias, back pain and neck  pain.  Skin: Negative for rash.  Neurological: Positive for facial asymmetry, weakness (right sided) and headaches. Negative for dizziness and syncope.      Allergies  Amoxicillin; Latex; Morphine and related; Penicillins; Buprenorphine; Latex; Meat extract; Morphine and related; Amoxicillin; Buprenorphine hcl; and Penicillins  Home Medications   Prior to Admission medications   Medication Sig Start Date End Date Taking? Authorizing Provider  aspirin 81 MG chewable tablet Chew 1 tablet (81 mg total) by mouth daily. 04/25/15  Yes Arvilla Market, DO  azaTHIOprine (IMURAN) 50 MG tablet Take 50 mg by mouth 2 (two) times daily.   Yes Historical Provider, MD  azaTHIOprine (IMURAN) 50 MG tablet Take 100 mg by mouth. 11/13/15  Yes Historical Provider, MD  clonazePAM (KLONOPIN) 0.5 MG tablet Take 1 tablet (0.5 mg total) by mouth 2 (two) times daily as needed for anxiety. Patient taking differently: Take 1 mg by mouth 2 (two) times daily as needed for anxiety.  07/24/15  Yes Ripudeep Jenna Luo, MD  hydroxychloroquine (PLAQUENIL) 200 MG tablet Take 300 mg by mouth daily.    Yes Historical Provider, MD  lipase/protease/amylase (CREON-12/PANCREASE) 12000 UNITS CPEP capsule Take 2 capsules by mouth 3 (three) times daily with meals. 07/31/13  Yes Catarina Hartshorn, MD  methocarbamol (ROBAXIN) 500 MG tablet Take 1 tablet (500 mg total) by mouth every 6 (six) hours as needed for muscle spasms. 10/01/15  Yes Ranelle Oyster, MD  Oxycodone HCl 10 MG TABS Take 1 tablet (10 mg total) by mouth every 6 (six) hours as needed (severe pain). 11/22/15  Yes Jones Bales, NP  pantoprazole (PROTONIX) 40 MG tablet Take 1 tablet (40 mg total) by mouth daily. 11/20/15  Yes Hilarie Fredrickson, MD  predniSONE (DELTASONE) 5 MG tablet  11/13/15  Yes Historical Provider, MD  sertraline (ZOLOFT) 100 MG tablet Take 200 mg by mouth daily.    Yes Historical Provider, MD  topiramate (TOPAMAX) 100 MG tablet Take 0.5-1 tablets (50-100 mg total) by  mouth 2 (two) times daily. Take 100 mg q am and may take 50 mg pm for headache and neuropathic pain 10/01/15  Yes Ranelle Oyster, MD  pravastatin (PRAVACHOL) 10 MG tablet Take 1 tablet (10 mg total) by mouth at bedtime. 07/24/15   Ripudeep Jenna Luo, MD  promethazine (PHENERGAN) 25 MG tablet TAKE 1 TABLET BY MOUTH EVERY 6 HOURS AS NEEDED FOR NAUSEA/VOMITING 02/16/15   Princella Pellegrini Zehr, PA-C   BP 109/79 mmHg  Pulse 80  Temp(Src) 97.4 F (36.3 C) (Oral)  Resp 21  SpO2 97% Physical Exam  Constitutional: No distress.  HENT:  Head: Normocephalic and atraumatic.  Nose: Nose normal.  Eyes: Conjunctivae and EOM  are normal. Pupils are equal, round, and reactive to light.  Neck: Normal range of motion. Neck supple.  Cardiovascular: Normal rate, regular rhythm, normal heart sounds and intact distal pulses.   No murmur heard. Pulmonary/Chest: Effort normal. No respiratory distress. She has no wheezes. She has no rales. She exhibits no tenderness.  Abdominal: Soft. She exhibits no distension. There is no tenderness.  Musculoskeletal: Normal range of motion. She exhibits no edema or tenderness.  Neurological: She is alert.  Patient with weakness in right hand compared to left.  Able to grasp but minimally with right hand.  Able to ambulate.  Asymmetric grimace on examination.  Skin: Skin is warm and dry. No rash noted. She is not diaphoretic.  Vitals reviewed.   ED Course  Procedures (including critical care time) Labs Review Labs Reviewed  DIFFERENTIAL - Abnormal; Notable for the following:    Neutro Abs 8.8 (*)    All other components within normal limits  COMPREHENSIVE METABOLIC PANEL - Abnormal; Notable for the following:    Chloride 112 (*)    CO2 16 (*)    Glucose, Bld 167 (*)    ALT 11 (*)    All other components within normal limits  CBG MONITORING, ED - Abnormal; Notable for the following:    Glucose-Capillary 153 (*)    All other components within normal limits  PROTIME-INR  APTT   CBC  CLONAZEPAM LEVEL  TOPIRAMATE LEVEL  I-STAT TROPOININ, ED  CBG MONITORING, ED    Imaging Review No results found. I have personally reviewed and evaluated these images and lab results as part of my medical decision-making.   EKG Interpretation None      MDM  Patient was seen and evaluated in stable condition. History and previous presentations reviewed in computer. Patient noted to have had stroke workup for similar episode in the past that was negative. Case was discussed directly on the phone with the patient's rheumatologist. At this time the patient is having symptoms very similar to her previous episodes and usually responds well to high dose steroids. Her rheumatologist recommended IV Solu-Medrol and pain control. Solu-Medrol and Dilaudid were ordered. Case was discussed with internal medicine resident who agreed with admission. Patient was admitted to the MedSurg floor under the care of Dr. Rogelia Boga for continued treatment. Did discuss with internal medicine resident that if symptoms do not improve with high-dose steroids to rheumatologist said at that point she would recommend imaging although at this time this seems very similar of her previous episodes. Final diagnoses:  Intractable headache, unspecified chronicity pattern, unspecified headache type  Weakness    1. Headache 2. Weakness    Leta Baptist, MD 11/29/15 [phone]

## 2015-11-29 NOTE — ED Notes (Signed)
Attempted report 

## 2015-11-29 NOTE — H&P (Signed)
Date: 11/29/2015               Patient Name:  Mary [name] MRN: [id number]  DOB: 1975-09-29 Age / Sex: 40 y.o., female   PCP: [name], NP         Medical Service: Internal Medicine Teaching Service         Attending Physician: Dr. Burns Spain, MD    First Contact: Dr. [name] Pager: [phone]  Second Contact: Dr. Heywood Iles Pager: [phone]       After Hours (After 5p/  First Contact Pager: [phone]  weekends / holidays): Second Contact Pager: [phone]   Chief Complaint: "I can't remember."  History of Present Illness:  Ms. Acton is a 40 year old lady with a mysterious medical history with mention of systemic lupus erythematosus, CADASIL, and psychosomatic disorder, presenting with a headache followed by short-term memory loss. The history was provided by her caretaker at the bedside. 3 years ago, Shaaron started having severe persistent headaches and associated right-sided weakness. She has been extensively followed by neurology and rheumatology at [facility]. She has positive ANA titers to 1:160 in the past, and a brain MRI in December 2016 showed questionable CADASIL. 6 months ago, she was started on a very gradual prednisone taper, and had been they're doing very well on prednisone 10 mg daily for the last 2 months, until she had a another "attack." During this attack, she reaches towards her head as though she is in severe pain, begins crying, and loses the strength in her right upper and lower extremities. The emergency physician called her rheumatologist who recommended high-dose steroids until the episode resolves. Review of systems was not obtained as the patient is minimally conversant.  Meds: Aspirin 81 mg daily Azathioprine 50 mg twice daily Clonazepam 0.5 mg twice daily as needed for anxiety Hydroxychloroquine 300 mg daily Methocarbamol 500 mg every 6 hours as needed for muscle spasms Oxycodone 10 mg every 6 hours as needed for severe pain Pantoprazole 40 mg  daily Prednisone 10 mg daily Sertraline 200 mg daily Topiramate 100 mg in the morning and 50 mg at night for headache Pravastatin 10 mg daily  Allergies: Amoxicillin causes anaphylaxis Morphine causes anaphylaxis  Past medical history: Pancreatitis Lupus Questionable CADASIL Gastric bypass  Family history: No autoimmune disorders in the family No history of cancer  Social history: She lives with her mom and is independent of all ADLs No alcohol, tobacco, or illicit drug use  Review of Systems: Per HPI  Physical Exam: Blood pressure 111/78, pulse 71, temperature 97.4 F (36.3 C), temperature source Oral, resp. rate 22, SpO2 97 %. General: resting in bed comfortably, stoic, flattened affect, responds yes or no to questions at times HEENT: no scleral icterus, extra-ocular muscles intact, oropharynx without lesions Cardiac: regular rate and rhythm, no rubs, murmurs or gallops Pulm: breathing well, clear to auscultation bilaterally Abd: bowel sounds normal, soft, nondistended, non-tender Ext: warm and well perfused, without pedal edema Lymph: no cervical or supraclavicular lymphadenopathy Skin: no rash, hair, or nail changes Neuro: alert and oriented to self and place only, cranial nerves II-XII grossly intact, RUE and RLE strength 3/5 compared to 5/5 on left, otherwise non-focal neurologic exam  Lab results: Basic Metabolic Panel:  Recent Labs  19/50/93 1343  NA 141  K 3.9  CL 112*  CO2 16*  GLUCOSE 167*  BUN 11  CREATININE 0.69  CALCIUM 9.0   Liver Function Tests:  Recent Labs  11/29/15 1343  AST 17  ALT 11*  ALKPHOS 85  BILITOT 0.5  PROT 7.6  ALBUMIN 3.9   CBC:  Recent Labs  11/29/15 1343  WBC 10.2  NEUTROABS 8.8*  HGB 12.7  HCT 38.9  MCV 92.2  PLT 339   Assessment & Plan by Problem:  Ms. Bellin is a 40 year old lady with a mysterious medical history with mention of systemic lupus erythematosis and CADASIL presenting with headache  followed by short term memory loss and right hemiparesis.  Headache, short term memory loss, and right hemiparesis: This is a very odd presentation but seems to be quite common for her. Her rheumatologist recommended treating her with IV solumedrol and avoiding further imaging unless she develops new objective neurologic deficits besides her right hemiparesis, so we'll do just that. The family says she typically returns to her normal mental status after a few days of high-dose steroids. -IV solumedrol  daily -Continue hydroxychloroquine  daily -Continue azathioprine  twice daily -Continue sertraline  daily -Continue oxycodone  every 6 hours as needed for pain -Continue methocarbamol  every 6 hours as needed for muscle spasms -Continue clonazepam  twice daily as needed for anxiety  Dispo: Disposition is deferred at this time, awaiting improvement of current medical problems. Anticipated discharge in approximately 2-5 day(s).   The patient does have a current PCP [name], NP) and does need an [facility] hospital follow-up appointment after discharge.  The patient does have transportation limitations that hinder transportation to clinic appointments.  Signed: [name], MD 11/29/2015, 6:55 PM

## 2015-11-30 DIAGNOSIS — F449 Dissociative and conversion disorder, unspecified: Secondary | ICD-10-CM | POA: Diagnosis not present

## 2015-11-30 DIAGNOSIS — K861 Other chronic pancreatitis: Secondary | ICD-10-CM | POA: Diagnosis present

## 2015-11-30 DIAGNOSIS — E538 Deficiency of other specified B group vitamins: Secondary | ICD-10-CM | POA: Diagnosis present

## 2015-11-30 DIAGNOSIS — Z9884 Bariatric surgery status: Secondary | ICD-10-CM | POA: Diagnosis not present

## 2015-11-30 DIAGNOSIS — R413 Other amnesia: Secondary | ICD-10-CM | POA: Diagnosis present

## 2015-11-30 DIAGNOSIS — F4010 Social phobia, unspecified: Secondary | ICD-10-CM | POA: Diagnosis present

## 2015-11-30 DIAGNOSIS — M329 Systemic lupus erythematosus, unspecified: Secondary | ICD-10-CM | POA: Diagnosis present

## 2015-11-30 DIAGNOSIS — R4781 Slurred speech: Secondary | ICD-10-CM | POA: Diagnosis present

## 2015-11-30 DIAGNOSIS — R4182 Altered mental status, unspecified: Secondary | ICD-10-CM | POA: Diagnosis not present

## 2015-11-30 DIAGNOSIS — F459 Somatoform disorder, unspecified: Secondary | ICD-10-CM | POA: Diagnosis present

## 2015-11-30 DIAGNOSIS — R51 Headache: Secondary | ICD-10-CM | POA: Diagnosis present

## 2015-11-30 DIAGNOSIS — I6789 Other cerebrovascular disease: Secondary | ICD-10-CM

## 2015-11-30 DIAGNOSIS — R531 Weakness: Secondary | ICD-10-CM | POA: Diagnosis not present

## 2015-11-30 DIAGNOSIS — M6289 Other specified disorders of muscle: Secondary | ICD-10-CM | POA: Diagnosis not present

## 2015-11-30 DIAGNOSIS — Z881 Allergy status to other antibiotic agents status: Secondary | ICD-10-CM | POA: Diagnosis not present

## 2015-11-30 DIAGNOSIS — R2990 Unspecified symptoms and signs involving the nervous system: Secondary | ICD-10-CM | POA: Diagnosis not present

## 2015-11-30 DIAGNOSIS — L818 Other specified disorders of pigmentation: Secondary | ICD-10-CM | POA: Diagnosis not present

## 2015-11-30 DIAGNOSIS — K909 Intestinal malabsorption, unspecified: Secondary | ICD-10-CM | POA: Diagnosis present

## 2015-11-30 DIAGNOSIS — E876 Hypokalemia: Secondary | ICD-10-CM | POA: Diagnosis present

## 2015-11-30 DIAGNOSIS — Z87891 Personal history of nicotine dependence: Secondary | ICD-10-CM | POA: Diagnosis not present

## 2015-11-30 DIAGNOSIS — K219 Gastro-esophageal reflux disease without esophagitis: Secondary | ICD-10-CM | POA: Diagnosis present

## 2015-11-30 DIAGNOSIS — R29898 Other symptoms and signs involving the musculoskeletal system: Secondary | ICD-10-CM | POA: Diagnosis not present

## 2015-11-30 DIAGNOSIS — Z7982 Long term (current) use of aspirin: Secondary | ICD-10-CM | POA: Diagnosis not present

## 2015-11-30 DIAGNOSIS — G8191 Hemiplegia, unspecified affecting right dominant side: Secondary | ICD-10-CM | POA: Diagnosis not present

## 2015-11-30 DIAGNOSIS — Z886 Allergy status to analgesic agent status: Secondary | ICD-10-CM | POA: Diagnosis not present

## 2015-11-30 DIAGNOSIS — Z79899 Other long term (current) drug therapy: Secondary | ICD-10-CM | POA: Diagnosis not present

## 2015-11-30 DIAGNOSIS — Z7952 Long term (current) use of systemic steroids: Secondary | ICD-10-CM | POA: Diagnosis not present

## 2015-11-30 LAB — CLONAZEPAM LEVEL: CLONAZEPAM LVL: NOT DETECTED ng/mL (ref 20–70)

## 2015-11-30 LAB — TOPIRAMATE LEVEL: Topiramate Lvl: 4.8 ug/mL (ref 2.0–25.0)

## 2015-11-30 LAB — VITAMIN B12: Vitamin B-12: 148 pg/mL — ABNORMAL LOW (ref 180–914)

## 2015-11-30 MED ORDER — AZATHIOPRINE 50 MG PO TABS
200.0000 mg | ORAL_TABLET | Freq: Two times a day (BID) | ORAL | Status: DC
  Administered 2015-11-30: 200 mg via ORAL
  Administered 2015-11-30: 150 mg via ORAL
  Administered 2015-12-01 – 2015-12-05 (×9): 200 mg via ORAL
  Filled 2015-11-30 (×11): qty 4

## 2015-11-30 MED ORDER — CYANOCOBALAMIN 1000 MCG/ML IJ SOLN
1000.0000 ug | Freq: Once | INTRAMUSCULAR | Status: AC
  Administered 2015-11-30: 1000 ug via INTRAMUSCULAR
  Filled 2015-11-30: qty 1

## 2015-11-30 NOTE — Progress Notes (Signed)
Called into pt's room by family member that pt was having an episode of unresponsiveness, pt found to be holding her head with both arms, rocking back and forth but could not answer questions nor follow command, after about 2-3 minutes, pt became alert and started to cry,c/o of headache, tab oxycodone 10mg  was earlier given at 1953, Doc on call paged and notified as pt was requesting for iv dilaudid, extra dose of 5mg  OXY was ordered and given at 2221, pt reassured, will continue to monitor. Obasogie-Asidi, Itzell Bendavid Efe

## 2015-11-30 NOTE — Care Management Note (Signed)
Case Management Note  Patient Details  Name: LENITA PEREGRINA MRN: [id number] Date of Birth: 26-May-1976  Subjective/Objective:     Pt admitted with headaches. It seems she spends time between homes: brothers, step mom's and mothers.                Action/Plan: Awaiting medical work up. CM following for discharge disposition.   Expected Discharge Date:                  Expected Discharge Plan:     In-House Referral:     Discharge planning Services     Post Acute Care Choice:    Choice offered to:     DME Arranged:    DME Agency:     HH Arranged:    HH Agency:     Status of Service:  In process, will continue to follow  Medicare Important Message Given:    Date Medicare IM Given:    Medicare IM give by:    Date Additional Medicare IM Given:    Additional Medicare Important Message give by:     If discussed at Long Length of Stay Meetings, dates discussed:    Additional Comments:  Kermit Balo, RN 11/30/2015, 10:59 AM

## 2015-11-30 NOTE — Progress Notes (Signed)
RN entered room at 2214 patient had hands placed bilateral temples, rocking motion, patient did not respond to voice. Patient arousable to soft stimulation, frightened, tearful, disoriented x 4. RN reoriented patient and provided reassurance. RN will continue to monitor.

## 2015-11-30 NOTE — Progress Notes (Signed)
Patient ID: Mary [name], female   DOB: 1976/01/07, 40 y.o.   MRN: [id number]   Subjective: Mary [name] tells Mary [name] her head continues to bother her, and she forgot where she is. Her mother helped corroborate a good deal of information which I've outlined in my assessment below.  Objective: Vital signs in last 24 hours: Filed Vitals:   11/29/15 2137 11/30/15 0112 11/30/15 0600 11/30/15 0924  BP: 100/49 101/58 98/62 98/59   Pulse: 73 68 57 56  Temp: 97.9 F (36.6 C) 97.7 F (36.5 C) 97.8 F (36.6 C) 97.6 F (36.4 C)  TempSrc: Oral Oral Oral Oral  Resp: SpO2: 97% 95% 98% 99%   General: resting in bed comfortably, stoic, flattened affect, responds yes or no to questions at times HEENT: no scleral icterus, extra-ocular muscles intact, oropharynx without lesions Cardiac: regular rate and rhythm, no rubs, murmurs or gallops Pulm: breathing well, clear to auscultation bilaterally Abd: bowel sounds normal, soft, nondistended, non-tender Ext: warm and well perfused, without pedal edema Lymph: no cervical or supraclavicular lymphadenopathy Skin: two flesh-colored plaques on forehead Neuro: alert and oriented to self and place only, cranial nerves II-XII grossly intact, RUE and RLE strength 3/5 compared to 5/5 on left, otherwise non-focal neurologic exam   Medications: I have reviewed the patient's current medications. Scheduled Meds: . aspirin  81 mg Oral Daily  . azaTHIOprine  50 mg Oral BID  . enoxaparin (LOVENOX) injection  40 mg Subcutaneous Q24H  . hydroxychloroquine  300 mg Oral Daily  . lipase/protease/amylase  2 capsule Oral TID WC  . methylPREDNISolone (SOLU-MEDROL) injection  125 mg Intravenous Daily  . pantoprazole  40 mg Oral Daily  . sertraline  200 mg Oral Daily   Continuous Infusions:  PRN Meds:.clonazePAM, methocarbamol, oxyCODONE   Assessment/Plan:  Mary [name] is a 40 year old lady with a mysterious medical history with mention of systemic lupus  erythematosis and CADASIL presenting with headache followed by short term memory loss and right hemiparesis.  Headache, short term memory loss, and right hemiparesis: This is an incredibly odd and vague neurologic presentation; objectively-speaking she has had recurrent pancreatitis, periventricular white matter changes on her brain MRI, ANA+, dsDNA+, and anti-RNP+. Given her recurrent pancreatitis and flesh-colored plaques on her head, I can't help but wonder if she may have IGG-4 disease, so we'll check that today. Additionally, she had a NOTCH-3 genetic test to screen for CADASIL sent back in 2016 but this was canceled for some unknown reason. After reading the literature, skin biopsy seems to be quite sensitive; there are both immunofixation and electron microscopy options. Our pathologists here at [facility] are helping Mary [name] work with [facility] to assist Mary [name] with an electron microscopy read. We'll see if we can pursue another NOTCH3 test; if not, we will pursue skin biopsy. -Will continue investigating NOTCH3 versus skin biopsy tests to investigate CADASIL -If new neurologic changes or she continue to be mentally-altered tomorrow, will obtain another brain MRI -Continue IV solumedrol  daily -Continue hydroxychloroquine  daily -Continue azathioprine  twice daily -Continue sertraline  daily -Continue oxycodone  every 6 hours as needed for pain -Continue methocarbamol  every 6 hours as needed for muscle spasms -Continue clonazepam  twice daily as needed for anxiety  Dispo: Disposition is deferred at this time, awaiting improvement of current medical problems.  Anticipated discharge in approximately 1-4 day(s).   The patient does have a current PCP Mary Hansen, NP) and does need an [facility] hospital  follow-up appointment after discharge.  The patient does have transportation limitations that hinder transportation to clinic appointments.  .Services Needed at time of discharge: Y =  Yes, Blank = No PT:   OT:   RN:   Equipment:   Other:     Mary Cooley, MD 11/30/2015, 10:16 AM

## 2015-11-30 NOTE — Progress Notes (Signed)
RN called into room by patients mother. Patients mother stated patient had an episode. Patient was frightened and tearful, disoriented x 4. Full head to toe and nuero assessment completed.  RN reoriented patient and provided reassurance. Patients mother stated "this is worse, she normally knows who she is." Patient c/o 8/10 headache. RN will continue to monitor.

## 2015-11-30 NOTE — Progress Notes (Signed)
  Date: 11/30/2015  Patient name: Mary [name]  Medical record number: [id number]  Date of birth: 12-Feb-1976   I have seen and evaluated Mary [name] and discussed their care with the Residency Team. Mary [name] was seen by the IM team on AM rounds. Most info obtained from pt's step mother who states she is medical POA. We confirmed the hx Dr Earnest Conroy got yesterday from the pt's friend and asked additional questions. 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 She has a dx of SLE, tx at [facility] with Azathioprine 50 mg BID and Hydroxychloroquine 300 mg QD and a prednisone taper, now at 10 mg QD. 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 She missed all meds on the 12th and her meds on the 13th were delayed. She has had "attacks" in the past in which she has severe HA, bc confused, cannot remember anyone / anything. She also gets R face drawing up. R foot dragging, and stuttering. The HA last only 3 min but mental status changes only hours although the weakness can last for days. She had another attack on the 13th about noon. This attack has lasted much longer as her face is still drawing up, her confusion and amnesia remains. Dr Earnest Conroy spoke to her rheumatologist at [facility] who recommended admission and high dose steroids. Her step mother states she has only had minimal improvement since admission.  PMHx, Fam Hx, and/or Soc Hx : Chaotic home life - rotates btw step mother's home (her step mother is a Engineer, civil (consulting) and involved in [place]'s well being), brother's home, and mother's home. She switched from cigarettes to vapping about 2 yrs ago. No early CVA's in family. No autoimmune dz in family  Filed Vitals:   11/30/15 0600 11/30/15 0924  BP: 98/62 98/59  Pulse: 57 56  Temp: 97.8 F (36.6 C) 97.6 F (36.4 C)  Resp: 18 20   Gen : slow to respond but alert and oriented to place and self only Skin : tattoos, tongue piercing.  Neuro  : per Dr Earnest Conroy' note  Assessment and Plan: I have seen and evaluated the patient as outlined above. I agree with the formulated Assessment and Plan as detailed in the residents' note, with the following changes:   Mary [name] has sxs complex of severe short HA followed by confusion, amnesia, and R sided paralysis. She has a dx of SLE and her MRI Dec 2016 was possibly consistent with CADASIL.Per step mother genetic test for CADASIL was obtained at [facility] in Dec but was then cancelled. Her sxs complexes seems to fit with CADASIL - migranes, motor symptoms (hemiplegic migraine), confusion, altered consciousness. One of best ways to confirm this dx is punch skin bx as abnl are seen in capillaries on EM.    1. CADASIL - NSAID for HA as rece by UTD. Skin bx. Avoid meds that affect mental status like opioids.  2. SLE - cont high dose steroids as rec by [facility] rheum although can cause mental status changes so need to consider this is she worsens. Cont home meds.   3. Tattoos - consider Hep C testing CADASIL doesn't seem to cause early death per [facility].   Burns Spain, MD 4/14/201710:10 AM

## 2015-11-30 NOTE — Evaluation (Signed)
Physical Therapy Evaluation Patient Details Name: Mary [name] MRN: [id number] DOB: Aug 16, 1976 Today's Date: 11/30/2015   History of Present Illness  Mary [name] is a 40 year old lady with a mysterious medical history with mention of systemic lupus erythematosis and CADASIL presenting with headache followed by short term memory loss and right hemiparesis.    Clinical Impression  Pt admitted with above symptoms. Patient demonstrated break-away weakness in RLE with strength testing. During ambulation she demonstrated greater strength than when she was tested (no knee buckling or difficulty advancing RLE; began to shuffle/drag RLE as ambulation progressed). Overall, walked 150 ft with min-guard assist for safety. Pt currently with functional limitations due to the deficits listed below (see PT Problem List).  Pt may benefit from skilled PT to increase their independence and safety with mobility to allow discharge home with family providing 24/7 assist/supervision.       Follow Up Recommendations No PT follow up (discussed with step-mother and agrees); Supervision/Assistance - 24 hour (24/7 due to cognition)    Equipment Recommendations  None recommended by PT    Recommendations for Other Services       Precautions / Restrictions Precautions Precautions: Fall      Mobility  Bed Mobility Overal bed mobility: Modified Independent                Transfers Overall transfer level: Modified independent Equipment used: None             General transfer comment: x3 without assist or imbalance  Ambulation/Gait Ambulation/Gait assistance: Min guard;Supervision Ambulation Distance (Feet): 100 Feet Assistive device: None Gait Pattern/deviations: Step-through pattern;Decreased stride length;Decreased dorsiflexion - right;Shuffle;Wide base of support Gait velocity: very slow; able to increase speed minimally Gait velocity interpretation: Below normal speed for  age/gender General Gait Details: Patient initially with no foot drop/dragging of RLE (as going to bathroom). As ambulation progressed, she began to shuffle/drag her Rt foot. No knee buckling or imbalance  Stairs            Wheelchair Mobility    Modified Rankin (Stroke Patients Only) Modified Rankin (Stroke Patients Only) Pre-Morbid Rankin Score: No significant disability Modified Rankin: Moderately severe disability     Balance Overall balance assessment: Independent (donned socks I'ly in sitting)                                           Pertinent Vitals/Pain Pain Assessment: No/denies pain    Home Living Family/patient expects to be discharged to:: Private residence Living Arrangements: Other relatives (primarily lives with step-mother ) Available Help at Discharge: Family;Available 24 hours/day Type of Home: House Home Access: Ramped entrance     Home Layout: One level Home Equipment: Cane - single point;Wheelchair - [employer] - 2 wheels;Tub bench;Shower seat;Bedside commode      Prior Function Level of Independence: Needs assistance   Gait / Transfers Assistance Needed: Stepmother reports recently pt has been independent with mobility and for past two weeks living with her brother as his caregiver (brother with recent work injury); she has varied between w/c, walker, cane or nothing when ambulating           Hand Dominance   Dominant Hand: Right    Extremity/Trunk Assessment   Upper Extremity Assessment: Overall WFL for tasks assessed (used functionally; washed hands at sink completing all steps)  Lower Extremity Assessment: RLE deficits/detail RLE Deficits / Details: AROM WFL (bradykinetic); with testing strength 3 to 3+, however functionally she demonstrated greater strength    Cervical / Trunk Assessment: Other exceptions  Communication   Communication: Expressive difficulties (at times does not answer; other  times delayed)  Cognition Arousal/Alertness: Awake/alert Behavior During Therapy: Anxious (stepmother reports "scared of unfamiliar people right now") Overall Cognitive Status: Impaired/Different from baseline Area of Impairment: Attention;Memory;Following commands   Current Attention Level: Sustained Memory: Decreased short-term memory Following Commands: Follows one step commands inconsistently;Follows one step commands with increased time            General Comments General comments (skin integrity, edema, etc.): Stepmother answered many questions when pt unable.     Exercises        Assessment/Plan    PT Assessment Patient needs continued PT services  PT Diagnosis Difficulty walking   PT Problem List Decreased strength;Decreased activity tolerance;Decreased mobility;Decreased cognition;Impaired sensation;Obesity  PT Treatment Interventions Gait training;Functional mobility training;Therapeutic activities;Therapeutic exercise;Cognitive remediation;Patient/family education   PT Goals (Current goals can be found in the Care Plan section) Acute Rehab PT Goals Patient Stated Goal: pt unable PT Goal Formulation: With family Time For Goal Achievement: 12/07/15 Potential to Achieve Goals: Good    Frequency Min 3X/week   Barriers to discharge        Co-evaluation               End of Session Equipment Utilized During Treatment: Gait belt Activity Tolerance: Patient limited by fatigue Patient left: in chair;with family/visitor present (Stepmother stated she will not leave the room while pt up) Nurse Communication: Mobility status         Time: 1610-9604 PT Time Calculation (min) (ACUTE ONLY): 31 min   Charges:   PT Evaluation $PT Eval Low Complexity: 1 Procedure PT Treatments $Gait Training: 8-22 mins   PT G Codes:        Jeancarlo Leffler December 04, 2015, 4:54 PM Pager [phone]

## 2015-11-30 NOTE — Progress Notes (Signed)
Pt arrived from ED at shift change with c/o of headache with short term memory loss, pt alert still c/o of headache, settled in bed with call light and family at bedside, will however continue to monitor. Mary [name], Nyari Olsson Efe

## 2015-12-01 ENCOUNTER — Inpatient Hospital Stay (HOSPITAL_COMMUNITY): Payer: Medicaid Other

## 2015-12-01 LAB — IGG, IGA, IGM
IGA: 347 mg/dL (ref 87–352)
IGG (IMMUNOGLOBIN G), SERUM: 1010 mg/dL (ref 700–1600)
IGM, SERUM: 94 mg/dL (ref 26–217)

## 2015-12-01 LAB — IGG 4: IGG 4: 30 mg/dL (ref 1–291)

## 2015-12-01 MED ORDER — GADOBENATE DIMEGLUMINE 529 MG/ML IV SOLN
20.0000 mL | Freq: Once | INTRAVENOUS | Status: AC | PRN
  Administered 2015-12-01: 20 mL via INTRAVENOUS

## 2015-12-01 MED ORDER — LORAZEPAM BOLUS VIA INFUSION: 2.0000 mg | Freq: Once | INTRAVENOUS | Status: DC | PRN

## 2015-12-01 MED ORDER — LORAZEPAM BOLUS VIA INFUSION
1.0000 mg | Freq: Once | INTRAVENOUS | Status: DC | PRN
  Filled 2015-12-01: qty 1

## 2015-12-01 MED ORDER — LORAZEPAM 2 MG/ML IJ SOLN
2.0000 mg | Freq: Once | INTRAMUSCULAR | Status: AC | PRN
Start: 2015-12-01 — End: 2015-12-01
  Administered 2015-12-01: 2 mg via INTRAVENOUS
  Filled 2015-12-01: qty 1

## 2015-12-01 MED ORDER — TOPIRAMATE 25 MG PO TABS
50.0000 mg | ORAL_TABLET | Freq: Two times a day (BID) | ORAL | Status: DC
  Administered 2015-12-01: 100 mg via ORAL
  Filled 2015-12-01 (×2): qty 4

## 2015-12-01 MED ORDER — OXYCODONE HCL 5 MG PO TABS
5.0000 mg | ORAL_TABLET | Freq: Three times a day (TID) | ORAL | Status: DC | PRN
  Administered 2015-12-01 – 2015-12-05 (×6): 5 mg via ORAL
  Filled 2015-12-01 (×9): qty 1

## 2015-12-01 MED ORDER — CYANOCOBALAMIN 1000 MCG/ML IJ SOLN
1000.0000 ug | Freq: Once | INTRAMUSCULAR | Status: AC
  Administered 2015-12-01: 1000 ug via INTRAMUSCULAR
  Filled 2015-12-01: qty 1

## 2015-12-01 NOTE — Progress Notes (Signed)
Patient unable to recall or state her name, disoriented x 4. Patient is able to distinguish the difference between medications and if she is missing medications. Patient mother stated patients home medication regimen is different than how she is schedule to take them here at hospital. Patient family confirmed patient fall in 08/2015. Report given to oncoming RN on patients current mental and physical state. Patient easily frightened. RN will continue to monitor patient

## 2015-12-01 NOTE — Progress Notes (Signed)
Patient ID: Mary [name], female   DOB: Sep 17, 1975, 40 y.o.   MRN: [id number]   Subjective: Ms. Zemaitis is joined by her step mother Avon Gully) to assist with communicating today. She reportedly had several episodes of confusion and headaches overnight and could not remember her name. Nonetheless, the patient did remember that she was not given her Topiramate. She was also complaining of double vision yesterday.   Objective: Vital signs in last 24 hours: Filed Vitals:   11/30/15 1418 11/30/15 1831 11/30/15 2246 12/01/15 0556  BP: 104/59 111/65 108/56 91/55  Pulse: 72 81 56 51  Temp: 99 F (37.2 C) 98.8 F (37.1 C) 97.8 F (36.6 C) 97.6 F (36.4 C)  TempSrc: Oral Oral Oral Oral  Resp: SpO2: 97% 97% 99% 100%   General: Sitting up in bed, NAD. Appears confused HEENT: no scleral icterus, extra-ocular muscles intact, oropharynx without lesions Cardiac: regular rate and rhythm, no rubs, murmurs or gallops Pulm: breathing well, clear to auscultation bilaterally Abd: Obese. bowel sounds normal, soft, nondistended, non-tender Ext: warm and well perfused, without pedal edema Skin: two flesh-colored plaques on forehead Neuro: Alert. When asked what her name is, she says, "Ma says my name is Cicilia," with a similar response to location. She did not know the year. Right face appears less strained today. She was not meaningfully cooperative with the exam this morning, but it appeared that her weakness (4/5) on the left was unchanged since yesterday. No proprioception or clear light touch sensation on the right. Psychiatric: Patient exhibits paranoia as she cowers when I approached for the exam and required encouragement from her HCPOA. Child-like yet blunted affect.   Medications: I have reviewed the patient's current medications. Scheduled Meds: . aspirin  81 mg Oral Daily  . azaTHIOprine  200 mg Oral BID  . enoxaparin (LOVENOX) injection  40 mg Subcutaneous Q24H  . hydroxychloroquine   300 mg Oral Daily  . lipase/protease/amylase  2 capsule Oral TID WC  . methylPREDNISolone (SOLU-MEDROL) injection  125 mg Intravenous Daily  . pantoprazole  40 mg Oral Daily  . sertraline  200 mg Oral Daily  . topiramate  50-100 mg Oral BID   Continuous Infusions:  PRN Meds:.clonazePAM, methocarbamol, oxyCODONE   Assessment/Plan:  Ms. Tomasello is a 40 year old lady with a mysterious medical history with mention of systemic lupus erythematosis and CADASIL presenting with headache followed by short term memory loss and right hemiparesis.  Headache, short term memory loss, and right hemiparesis: This is an incredibly odd and vague neurologic presentation; objectively-speaking she has had recurrent pancreatitis, periventricular white matter changes on her brain MRI, ANA+, dsDNA+, and anti-RNP+. The differential at this point include SLE cerebritis/vasculitis, CADASIL (although no family history, no anterior lesions), B12 deficiency (patient has been vegetarian for years, has severe B12 deficiency w/o anemia), Vitamin E deficiency (given fat malabsorption, although not typically associated with cognitive problems), IgG-4 RD (given h/o recurrent pancreatitis). Input from her rheumatologist recommended IV steroids; patient is not meaningfully improving. We will also decrease her pain regimen to avoid episodes of confusion.  - Notch3 pending - MRI brain w/ w/o contrast +/- spine MRI -IV solumedrol  daily -hydroxychloroquine  daily -azathioprine  twice daily -sertraline  daily - Reduce oxycodone  every 6 hours as needed for pain to 5 mg q8h prn given episode of confusion last ngiht -methocarbamol  every 6 hours as needed for muscle spasms -clonazepam  twice daily as needed for anxiety -  Topiramate 50-100 mg BID  Dispo: Disposition is deferred at this time, awaiting improvement of current medical problems.  Anticipated discharge in approximately 1-4 day(s).   The  patient does have a current PCP [name], NP) and does need an [facility] follow-up appointment after discharge.  The patient does have transportation limitations that hinder transportation to clinic appointments.  .Services Needed at time of discharge: Y = Yes, Blank = No PT:   OT:   RN:   Equipment:   Other:     Ruben Im, MD 12/01/2015, 9:15 AM

## 2015-12-02 DIAGNOSIS — E876 Hypokalemia: Secondary | ICD-10-CM

## 2015-12-02 LAB — CBC
HCT: 33.1 % — ABNORMAL LOW (ref 36.0–46.0)
Hemoglobin: 10.5 g/dL — ABNORMAL LOW (ref 12.0–15.0)
MCH: 29.2 pg (ref 26.0–34.0)
MCHC: 31.7 g/dL (ref 30.0–36.0)
MCV: 92.2 fL (ref 78.0–100.0)
Platelets: 276 10*3/uL (ref 150–400)
RBC: 3.59 MIL/uL — ABNORMAL LOW (ref 3.87–5.11)
RDW: 14.2 % (ref 11.5–15.5)
WBC: 9 10*3/uL (ref 4.0–10.5)

## 2015-12-02 LAB — BASIC METABOLIC PANEL
Anion gap: 9 (ref 5–15)
BUN: 13 mg/dL (ref 6–20)
CHLORIDE: 111 mmol/L (ref 101–111)
CO2: 21 mmol/L — ABNORMAL LOW (ref 22–32)
Calcium: 8.4 mg/dL — ABNORMAL LOW (ref 8.9–10.3)
Creatinine, Ser: 0.59 mg/dL (ref 0.44–1.00)
GFR calc Af Amer: >60 mL/min (ref >60)
GFR calc non Af Amer: >60 mL/min (ref >60)
GLUCOSE: 97 mg/dL (ref 65–99)
Potassium: 3.4 mmol/L — ABNORMAL LOW (ref 3.5–5.1)
Sodium: 141 mmol/L (ref 135–145)

## 2015-12-02 LAB — MAGNESIUM: MAGNESIUM: 2 mg/dL (ref 1.7–2.4)

## 2015-12-02 MED ORDER — KETOROLAC TROMETHAMINE 30 MG/ML IJ SOLN
30.0000 mg | Freq: Four times a day (QID) | INTRAMUSCULAR | Status: AC
  Administered 2015-12-02 – 2015-12-03 (×3): 30 mg via INTRAVENOUS
  Filled 2015-12-02 (×3): qty 1

## 2015-12-02 MED ORDER — KETOROLAC TROMETHAMINE 30 MG/ML IJ SOLN
30.0000 mg | Freq: Four times a day (QID) | INTRAMUSCULAR | Status: DC | PRN
  Administered 2015-12-02: 30 mg via INTRAVENOUS
  Filled 2015-12-02: qty 1

## 2015-12-02 MED ORDER — POTASSIUM CHLORIDE CRYS ER 20 MEQ PO TBCR
40.0000 meq | EXTENDED_RELEASE_TABLET | Freq: Once | ORAL | Status: AC
  Administered 2015-12-02: 40 meq via ORAL
  Filled 2015-12-02: qty 2

## 2015-12-02 MED ORDER — TOPIRAMATE 100 MG PO TABS
100.0000 mg | ORAL_TABLET | Freq: Every morning | ORAL | Status: DC
  Administered 2015-12-02 – 2015-12-05 (×4): 100 mg via ORAL
  Filled 2015-12-02 (×4): qty 1

## 2015-12-02 MED ORDER — KETOROLAC TROMETHAMINE 30 MG/ML IJ SOLN: 30.0000 mg | Freq: Four times a day (QID) | INTRAMUSCULAR | Status: DC | PRN

## 2015-12-02 MED ORDER — POLYVINYL ALCOHOL 1.4 % OP SOLN
1.0000 [drp] | OPHTHALMIC | Status: DC | PRN
  Administered 2015-12-03: 1 [drp] via OPHTHALMIC
  Filled 2015-12-02: qty 15

## 2015-12-02 MED ORDER — CYANOCOBALAMIN 1000 MCG/ML IJ SOLN
1000.0000 ug | Freq: Once | INTRAMUSCULAR | Status: AC
  Administered 2015-12-02: 1000 ug via INTRAMUSCULAR
  Filled 2015-12-02: qty 1

## 2015-12-02 MED ORDER — SALINE SPRAY 0.65 % NA SOLN
1.0000 | NASAL | Status: DC | PRN
  Filled 2015-12-02: qty 44

## 2015-12-02 MED ORDER — TOPIRAMATE 25 MG PO TABS
50.0000 mg | ORAL_TABLET | Freq: Every day | ORAL | Status: DC
  Administered 2015-12-02 – 2015-12-04 (×4): 50 mg via ORAL
  Filled 2015-12-02 (×4): qty 2

## 2015-12-02 NOTE — Progress Notes (Signed)
Patient ID: Mary [name], female   DOB: November 27, 1975, 39 y.o.   MRN: [id number]   Subjective: This morning, we attempted to enter the room though frightened the patient as she was without a familiar face. A few hours later, we returned when her stepmother was present.   Per her stepmother, she appears a bit improved though continues to have right foot drop and persistent headache. Her facial asymmetry and speech slurring had improved some. Upon asking the patient, she also acknowledged improvement of her headache with IV Toradol.   Objective: Vital signs in last 24 hours: Filed Vitals:   12/01/15 2305 12/02/15 0116 12/02/15 0524 12/02/15 0955  BP: 109/70 91/59 111/74 108/60  Pulse: 60 51 56 78  Temp: 98.3 F (36.8 C) 97.7 F (36.5 C) 97.7 F (36.5 C) 97.7 F (36.5 C)  TempSrc: Oral Oral Oral Oral  Resp: 18 16 16 18   SpO2: 96% 99% 99% 100%   General: Sitting up in bed, confused-appearing HEENT: no scleral icterus, extra-ocular muscles intact Cardiac: regular rate and rhythm, no rubs, murmurs or gallops Pulm: breathing well, clear to auscultation bilaterally Abd: Obese. bowel sounds normal, soft, nondistended, non-tender Ext: warm and well perfused, without pedal edema Skin: two flesh-colored plaques on forehead Neuro: Alert and oriented to name, place with prompting but not day of the week. Right face with less strain initially though gradually increased throughout the exam. Followed commands. Stable weakness of R upper/lower extremities with diminished sensation.  Psychiatric: Shy of strangers. Child-like yet blunted affect.   Medications: I have reviewed the patient's current medications. Scheduled Meds: . aspirin  81 mg Oral Daily  . azaTHIOprine  200 mg Oral BID  . enoxaparin (LOVENOX) injection  40 mg Subcutaneous Q24H  . hydroxychloroquine  300 mg Oral Daily  . lipase/protease/amylase  2 capsule Oral TID WC  . methylPREDNISolone (SOLU-MEDROL) injection  125 mg Intravenous  Daily  . pantoprazole  40 mg Oral Daily  . sertraline  200 mg Oral Daily  . topiramate  100 mg Oral q morning - 10a   And  . topiramate  50 mg Oral QHS   Continuous Infusions:  PRN Meds:.clonazePAM, ketorolac, methocarbamol, oxyCODONE, sodium chloride   Assessment/Plan: Mary [name] is a 40 year old lady with a mysterious medical history with mention of systemic lupus erythematosis and CADASIL presenting with headache followed by short term memory loss and right hemiparesis.  Headache, short term memory loss, and right hemiparesis: Mild improvement today of an otherwise peculiar neurologic presentation that appears to be multifactorial in etiology. 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 Also checking for Vitamin E deficiency (given fat malabsorption, although not typically associated with cognitive problems), and IgG-4 deficiency negative.  -Follow-up with outpatient rheumatologist -Follow-up Notch3 pending -Continue IV solumedrol 125mg  daily -Continue hydroxychloroquine 300mg  daily -Continue azathioprine 200mg  twice daily -Continue sertraline 200mg  daily -Continue oxycodone 5 mg q8h prn given episode of confusion last night -Continue methocarbamol 500mg  every 6 hours as needed for muscle spasms -Continue clonazepam 1mg  twice daily as needed for anxiety -Continue Topiramate 50-100 mg BID -Give Toradol 30mg  IV every 6 hours for headache x 4 doses and reassess  Hypokalemia: K 3.4 this morning. -Give Kdur <MEASUREM<MEASUREMEN<MEASUREMEN<MEASUREMEN<MEASUREMEN T>  -Check Mg now & BMET tomorrow morning  Dispo: Disposition is deferred at this time, awaiting improvement of current  medical problems.   The patient does have a current PCP Mary Hansen, NP) and does need an [facility] hospital follow-up appointment  after discharge.  The patient does have transportation limitations that hinder transportation to clinic appointments.  .Services Needed at time of discharge: Y = Yes, Blank = No PT:   OT:   RN:   Equipment:   Other:     Beather Arbour, MD 12/02/2015, 12:32 PM

## 2015-12-02 NOTE — Progress Notes (Signed)
Patient request something for pain. Patient unable to rate pain on pain scale and unable to see faces on board. Patient states "a lot" and points to forehead when asked to rate pain. 5 mg Oxy IR administered to patient. RN will reassess pain and continue to monitor patient. Per patient request RN called and placed breakfast order for 2 plain biscuits, scrambled eggs with cheese, 2-3 packs of mayo and coffee with cream/sugar.

## 2015-12-02 NOTE — Progress Notes (Signed)
Called to room by patients mother.  Keyly is having another "episode" that seems to be worse than the others according to the mother.  Pt is in bed on her knees holding her head, crying and rocking back and forth.  No precipitating factors.  According to the mother, pt was laying in bed beginning to go to sleep.

## 2015-12-02 NOTE — Progress Notes (Signed)
Scanner in patient room not functioning at all. RN administered 50 mg topapax 1 mg klonopin, 200 mg imuran and 500 mg robaxin to patient. Patient name, DOB and MRN confirmed prior to medication administration and computer/scanner replaced.

## 2015-12-02 NOTE — Progress Notes (Signed)
Spoke with in-patient pharmacist, unit pyxis does not have appropriate dose of Topamax. Pharmacist will send dose.

## 2015-12-03 DIAGNOSIS — R2990 Unspecified symptoms and signs involving the nervous system: Secondary | ICD-10-CM

## 2015-12-03 DIAGNOSIS — M6289 Other specified disorders of muscle: Secondary | ICD-10-CM

## 2015-12-03 DIAGNOSIS — R29898 Other symptoms and signs involving the musculoskeletal system: Secondary | ICD-10-CM

## 2015-12-03 LAB — VITAMIN E: ALPHA-TOCOPHEROL: 8.7 mg/L (ref 5.3–16.8)

## 2015-12-03 LAB — BASIC METABOLIC PANEL
Anion gap: 10 (ref 5–15)
BUN: 11 mg/dL (ref 6–20)
CO2: 19 mmol/L — ABNORMAL LOW (ref 22–32)
Calcium: 8.5 mg/dL — ABNORMAL LOW (ref 8.9–10.3)
Chloride: 115 mmol/L — ABNORMAL HIGH (ref 101–111)
Creatinine, Ser: 0.58 mg/dL (ref 0.44–1.00)
Glucose, Bld: 86 mg/dL (ref 65–99)
POTASSIUM: 3.9 mmol/L (ref 3.5–5.1)
SODIUM: 144 mmol/L (ref 135–145)

## 2015-12-03 LAB — TSH: TSH: 0.946 u[IU]/mL (ref 0.350–4.500)

## 2015-12-03 LAB — ANTI-DNA ANTIBODY, DOUBLE-STRANDED: ds DNA Ab: 10 IU/mL — ABNORMAL HIGH (ref 0–9)

## 2015-12-03 LAB — AMMONIA: AMMONIA: 49 umol/L — AB (ref 9–35)

## 2015-12-03 MED ORDER — CYANOCOBALAMIN 1000 MCG/ML IJ SOLN
1000.0000 ug | Freq: Once | INTRAMUSCULAR | Status: AC
  Administered 2015-12-03: 1000 ug via INTRAMUSCULAR
  Filled 2015-12-03: qty 1

## 2015-12-03 MED ORDER — KETOROLAC TROMETHAMINE 30 MG/ML IJ SOLN
30.0000 mg | Freq: Four times a day (QID) | INTRAMUSCULAR | Status: AC
  Administered 2015-12-03 – 2015-12-04 (×4): 30 mg via INTRAVENOUS
  Filled 2015-12-03 (×5): qty 1

## 2015-12-03 MED ORDER — METOCLOPRAMIDE HCL 5 MG/ML IJ SOLN
10.0000 mg | Freq: Once | INTRAMUSCULAR | Status: AC
  Administered 2015-12-03: 10 mg via INTRAVENOUS
  Filled 2015-12-03: qty 2

## 2015-12-03 MED ORDER — CYANOCOBALAMIN 1000 MCG/ML IJ SOLN
1000.0000 ug | Freq: Once | INTRAMUSCULAR | Status: DC
  Filled 2015-12-03: qty 1

## 2015-12-03 NOTE — Discharge Summary (Signed)
Name: Mary [name] MRN: [id number] DOB: 01-20-1976 40 y.o. PCP: [name], NP  Date of Admission: 11/29/2015  4:01 PM Date of Discharge: 12/05/2015 Attending Physician: Earl Lagos, MD  Discharge Diagnosis: 1. Headache, short term memory loss, and right hemiparesis  Discharge Medications:   Medication List    TAKE these medications        aspirin 81 MG chewable tablet  Chew 1 tablet (81 mg total) by mouth daily.     azaTHIOprine 50 MG tablet  Commonly known as:  IMURAN  Take 4 tablets (200 mg total) by mouth 2 (two) times daily.     clonazePAM 0.5 MG tablet  Commonly known as:  KLONOPIN  Take 1 tablet (0.5 mg total) by mouth 2 (two) times daily as needed for anxiety.     hydroxychloroquine 200 MG tablet  Commonly known as:  PLAQUENIL  Take 300 mg by mouth daily.     lipase/protease/amylase 85929 units Cpep capsule  Commonly known as:  CREON  Take 2 capsules by mouth 3 (three) times daily with meals.     meloxicam 15 MG tablet  Commonly known as:  MOBIC  Take 1 tablet (15 mg total) by mouth daily.     methocarbamol 500 MG tablet  Commonly known as:  ROBAXIN  Take 1 tablet (500 mg total) by mouth every 6 (six) hours as needed for muscle spasms.     Oxycodone HCl 10 MG Tabs  Take 1 tablet (10 mg total) by mouth every 6 (six) hours as needed (severe pain).     pantoprazole 40 MG tablet  Commonly known as:  PROTONIX  Take 1 tablet (40 mg total) by mouth daily.     pravastatin 10 MG tablet  Commonly known as:  PRAVACHOL  Take 1 tablet (10 mg total) by mouth at bedtime.     predniSONE 20 MG tablet  Commonly known as:  DELTASONE  Take 1 tablet (20 mg total) by mouth daily with breakfast.     predniSONE 5 MG tablet  Commonly known as:  DELTASONE  Take 1 tablet (5 mg total) by mouth daily with breakfast.     promethazine 25 MG tablet  Commonly known as:  PHENERGAN  TAKE 1 TABLET BY MOUTH EVERY 6 HOURS AS NEEDED FOR NAUSEA/VOMITING     sertraline  100 MG tablet  Commonly known as:  ZOLOFT  Take 200 mg by mouth daily.     topiramate 100 MG tablet  Commonly known as:  TOPAMAX  Take 0.5-1 tablets (50-100 mg total) by mouth 2 (two) times daily. Take 100 mg q am and may take 50 mg pm for headache and neuropathic pain     vitamin B-12 1000 MCG tablet  Commonly known as:  CYANOCOBALAMIN  Take 1 tablet (1,000 mcg total) by mouth daily.       Disposition and follow-up:   [name] was discharged from [facility] in stable condition.  At the hospital follow up visit please address:  1.  Patient was treated with IV solumedrol for treatment of presumed neurologic/neuropsychiatric manifestations of SLE. Other items on the differential included CADASIL, for which we are still awaiting genetic results. She improved somewhat clinically in terms of weakness and orientation. She is currently on a prednisone taper.  2. The patient complained of intermittent headaches. This was effectively addressed with Toradol as an inpatient. She was transitioned to a short course of meloxicam on discharge  3. Patient noted  to have recent development of B12 deficiency at 148. She received B12 shots here, but will likely need additional B12 injections at future outpatient clinic visits.  2.  Labs / imaging needed at time of follow-up: None  3.  Pending labs/ test needing follow-up: NOTCH 3 Genetic Testing for CADASIL  Follow-up Appointments:   Follow-up Information    Follow up with [name], NP. Go on 12/11/2015.   Specialty:  Nurse Practitioner   Why:  You have a 1:00 PM appointment. Try to see if this clinic can give you B12 Shots   Contact information:   [address] [place] [address] [phone]       Follow up with Francee Gentile, MD. Schedule an appointment as soon as possible for a visit in 1 week.   Specialty:  Internal Medicine   Contact information:   [address] [place]  [place] [address] [phone]       Discharge Instructions:   [name],  It was joy taking care of you in the hospital. You seem to have made some improvement since you've been here. We have transitioned you to steroids you can take by mouth. You will take decreasing doses of prednisone as outlined below. You will take the 20 mg prednisone first and transition to two tablets of the 5 mg tablets daily once the 20 mg tablets are finished.  We also found that your B12 level was low. We gave you B12 shots while you were here, which you should continue as an outpatient. It may take a couple more weeks of shots 3-7 days a week. In the mean time, we have given you some B12 tablets.   Day 1: 120 mg prednisone, six 20 mg tablets 2: 120 mg, 6 tablets 3: 100 mg, 5 tablets 4: 100 mg, 5 tablets 5: 80 mg, 4 tablets 6: 80 mg, 4 tablets 7: 60 mg, 3 tablets 8: 60 mg, 3 tablets 9: 40 mg, 2 tablets 10: 40 mg, 2 tablets 11: 20 mg, 1 tablet 12: 20 mg, 1 tablet 13: Switch to 2 tablets of the 5 mg prednisone once a day.  For your headaches, you received Toradol by vein. We have transitioned this to a short course of meloxicam. You will take it once a day.  While it is uncertain what is leading to your bouts of confusion, we are awaiting a genetic test, the results which will be informative. We have informed your doctors to follow up on this genetic test.  Once again, it is very important you regularly follow up with your rheumatologist and primary doctor.  It was a pleasure taking care of you here at [facility].  Consultations: Neurology, Speech Therapy, Physical Therapy  Procedures Performed:  Mr Lodema Pilot Contrast  12/01/2015  CLINICAL DATA:  Headache, short-term memory loss, and RIGHT hemiparesis. EXAM: MRI HEAD WITHOUT AND WITH CONTRAST TECHNIQUE: Multiplanar, multiecho pulse sequences of the brain and surrounding structures were obtained without and with intravenous contrast. CONTRAST:  20mL  MULTIHANCE GADOBENATE DIMEGLUMINE 529 MG/ML IV SOLN COMPARISON:  07/22/2015 MRI is most recent. FINDINGS: Unchanged moderate to severe white matter lesions, patchy to confluent supratentorial and pontine involvement, largely sparing the anterior temporal lobes. No restricted diffusion. The hypointensity on T1 weighted images, in some locations, is periventricular, but overall the white matter loss does not resemble the typical black holes of multiple sclerosis. No progression of disease from December 2016. Post infusion, no abnormal enhancement. Overall  normal cerebral volume. No hemorrhage or susceptibility. No features of large vessel occlusion. Extracranial soft tissues unremarkable. 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 Other considerations would include primary angiitis of the CNS, MELAS, or sporadic subcortical arteriosclerotic encephalopathy (sSAE). Electronically Signed   By: Elsie Stain M.D.   On: 12/01/2015 12:55    Admission HPI:  Ms. Holford is a 40 year old lady with a mysterious medical history with mention of systemic lupus erythematosus, CADASIL, and psychosomatic disorder, presenting with a headache followed by short-term memory loss. The history was provided by her caretaker at the bedside. 3 years ago, Lavaeh started having severe persistent headaches and associated right-sided weakness. She has been extensively followed by neurology and rheumatology at [facility]. She has positive ANA titers to 1:160 in the past, and a brain MRI in December 2016 showed questionable CADASIL. 6 months ago, she was started on a very gradual prednisone taper, and had been they're doing very well on prednisone 10 mg daily for the last 2 months, until she  had a another "attack." During this attack, she reaches towards her head as though she is in severe pain, begins crying, and loses the strength in her right upper and lower extremities. The emergency physician called her rheumatologist who recommended high-dose steroids until the episode resolves. Review of systems was not obtained as the patient is minimally conversant.  Hospital Course by problem list:   Headache, short term memory loss, and right hemiparesis: She presented with acute-onset severe headache that resolved spontaneously, but she was left with short-term memory loss and right hemiparesis. These events have been getting progressively more frequent over the last 3 years. 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 She has never had a confirmatory NOTCH3 gene mutation sent off tot he lab. She also been evaluated by rheumatology and has positive ANA titers 1:160, anti-RNP, and anti-dsDNA. Upon admission, she was started on IV solumedrol  daily per recommendation from her Rheumatologist, but she failed to improve cognitively nor neurologically by day 4. A brain MRI showed stable white matter hyperintensities without changes from December 2016. She also has had recurrent idiopathic pancreatitis; an IgG-4 level was checked that was normal. A vitamin B12 was slightly low at 150; she was given B12 injections but failed to improve. It was noted that her neurological exam was inconsistent; she would not move her right arm or leg when asked, but she would lift herself out of bed without a problem. She also feigned sternocleinomastoid strength by being unable to turn to the right, but she was able to turn to the left. 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 By day of  discharge, the patient's confusion and orientation had improved somewhat but were still far from her baseline. It was determined that she would be unlikely to achieve much more clinical improvement as an inpatient and would benefit from further evaluation at an academic tertiary care center in an outpatient setting. The patient was found to be medically stable for discharge. Physical therapy recommended no at home or in-facility interventions. A NOTCH3 genetic test was pending to query the diagnosis of CADASIL.  Discharge Vitals:   BP 99/59 mmHg  Pulse 76  Temp(Src) 97.9 F (36.6 C) (Oral)  Resp 20  SpO2 95%  Discharge Labs:  No results found for this or any previous visit (from the past 24 hour(s)).  Signed: [name], MD 12/05/2015, 9:53 AM

## 2015-12-03 NOTE — Progress Notes (Addendum)
Patient ID: Mary [name], female   DOB: December 09, 1975, 40 y.o.   MRN: [id number]   Subjective: Mary [name] continues to be fearful of strangers but is moving all extremities easily when not being overtly examined. When we asked to see her legs, she jokingly showed [name] her toy monkey's legs, then proceeded to uncross them to show [name] her own.  Objective: Vital signs in last 24 hours: Filed Vitals:   12/02/15 1727 12/02/15 2200 12/03/15 0111 12/03/15 0418  BP: 100/61 93/50 129/80 90/60  Pulse: 56 71 81 54  Temp: 98.3 F (36.8 C) 97.6 F (36.4 C) 98.2 F (36.8 C) 98.1 F (36.7 C)  TempSrc: Oral Oral Oral Oral  Resp: 18 18 16 16   SpO2: 98% 98% 97% 98%      General: young lady with numerous tattoos with a stoic, flattened affect, responds yes or no to questions at times Cardiac: regular rate and rhythm, no rubs, murmurs or gallops Pulm: breathing well, clear to auscultation bilaterally Abd: bowel sounds normal, soft, nondistended, non-tender Ext: warm and well perfused, without pedal edema Neuro: cranial nerves II-XII grossly intact, RUE and RLE strength 3/5 compared to 5/5 on left, sensation decreased on right lower extremities, otherwise normal     Lab Results: Basic Metabolic Panel:  Recent Labs Lab 12/02/15 0250 12/02/15 1324 12/03/15 0653  NA 141  --  144  K 3.4*  --  3.9  CL 111  --  115*  CO2 21*  --  19*  GLUCOSE 97  --  86  BUN 13  --  11  CREATININE 0.59  --  0.58  CALCIUM 8.4*  --  8.5*  MG  --  2.0  --    Medications: I have reviewed the patient's current medications. Scheduled Meds: . aspirin  81 mg Oral Daily  . azaTHIOprine  200 mg Oral BID  . cyanocobalamin  1,000 mcg Intramuscular Once  . enoxaparin (LOVENOX) injection  40 mg Subcutaneous Q24H  . hydroxychloroquine  300 mg Oral Daily  . ketorolac  30 mg Intravenous 4 times per day  . lipase/protease/amylase  2 capsule Oral TID WC  . methylPREDNISolone (SOLU-MEDROL) injection  125 mg Intravenous Daily  .  pantoprazole  40 mg Oral Daily  . sertraline  200 mg Oral Daily  . topiramate  100 mg Oral q morning - 10a   And  . topiramate  50 mg Oral QHS   Continuous Infusions:  PRN Meds:.clonazePAM, methocarbamol, oxyCODONE, polyvinyl alcohol   Assessment/Plan:   Ms. Dehnel is a 40 year old lady with a mysterious medical history with mention of systemic lupus erythematosis and CADASIL presenting with headache followed by short term memory loss and right hemiparesis that has not responded to steroids.  Headache, short term memory loss, and right hemiparesis: This is an incredibly odd and vague neurologic presentation; objectively-speaking she has had recurrent pancreatitis, periventricular white matter changes on her brain MRI suggestive of CADASIL, ANA+, dsDNA+, and anti-RNP+. My hunch is that this is indeed CADASIL or some sort of organic neurovascular disease with confounding psychiatric symptoms, as her strength is normal when she is not being directly observed. Because her symptoms have persisted despite high-dose steroids, I do not think this is a lupus flare.  We will consult neurology for their input and to help get her plugged into a specialist on an outpatient basis. We'll also try to contact her rheumatologist, Dr. Herma Carson. Lastly, we'll get speech pathology to evaluate her today. -Consulted neurology -Consulted  speech pathology -Tried one-time dose metoclopramide  IV to see if this helps her headache -Continue IV ketorolac  every 6 hours for headache  -Continue IV solumedrol  daily for now -Continue aspirin  daily -Continue hydroxychloroquine  daily -Continue azathioprine  twice daily -Continue sertraline  daily -Continue oxycodone  every 8 hours as needed for pain -Continue methocarbamol  every 6 hours as needed for muscle spasms -Continue clonazepam  twice daily as needed for anxiety -Continue topiramate  every morning and  every  evening -NOTCH3 gene mutation pending  Dispo: Disposition is deferred at this time, awaiting improvement of current medical problems.  Anticipated discharge in approximately 1-4 day(s).   The patient does have a current PCP [name], NP) and does need an [facility] hospital follow-up appointment after discharge.  The patient does have transportation limitations that hinder transportation to clinic appointments.  .Services Needed at time of discharge: Y = Yes, Blank = No PT:   OT:   RN:   Equipment:   Other:     LOS: 3 days   [name], MD 12/03/2015, 11:43 AM

## 2015-12-03 NOTE — Care Management Note (Signed)
Case Management Note  Patient Details  Name: TAIANA REXACH MRN: [id number] Date of Birth: 1975-12-29  Subjective/Objective:                    Action/Plan: Pt continues on IV steroids and toradol. CM continuing to follow for d/c needs.   Expected Discharge Date:                  Expected Discharge Plan:     In-House Referral:     Discharge planning Services     Post Acute Care Choice:    Choice offered to:     DME Arranged:    DME Agency:     HH Arranged:    HH Agency:     Status of Service:  In process, will continue to follow  Medicare Important Message Given:    Date Medicare IM Given:    Medicare IM give by:    Date Additional Medicare IM Given:    Additional Medicare Important Message give by:     If discussed at Long Length of Stay Meetings, dates discussed:    Additional Comments:  Kermit Balo, RN 12/03/2015, 11:24 AM

## 2015-12-03 NOTE — Progress Notes (Signed)
Physical Therapy Treatment Patient Details Name: Mary [name] MRN: [id number] DOB: [birth year] Today's Date: 12/03/2015    History of Present Illness Mary [name] is a 40 year old lady with a mysterious medical history with mention of systemic lupus erythematosis and CADASIL presenting with headache followed by short term memory loss and right hemiparesis.    PT Comments    Pt ambulating in hallway with step mom with RW. Pt con't to drag R LE. Step mom reports "if i can ever leave i'll bring in her R leg brace." Pt con't to be anxious and fearful of anyone she doesn't know and is only recognizing her stepmom, best friend and her best friends boyfriend. Acute PT to con't to follow to progress indep with mobility.  Follow Up Recommendations  No PT follow up;Supervision/Assistance - 24 hour     Equipment Recommendations  None recommended by PT    Recommendations for Other Services       Precautions / Restrictions Precautions Precautions: Fall Restrictions Weight Bearing Restrictions: No    Mobility  Bed Mobility Overal bed mobility: Modified Independent             General bed mobility comments: pt hooks L LE under R LE to get it back into bed  Transfers Overall transfer level: Needs assistance Equipment used: None Transfers: Sit to/from Stand Sit to Stand: Supervision         General transfer comment: pt stood up on own however quick and mildly impulsive  Ambulation/Gait Ambulation/Gait assistance: Min guard Ambulation Distance (Feet): 200 Feet Assistive device: Rolling walker (2 wheeled) Gait Pattern/deviations: Step-to pattern (decreased R step heigth) Gait velocity: decraesed   General Gait Details: pt dragging R LE across floor with toes internally rotated. pt with fluctuating degree of dragging of foot   Stairs            Wheelchair Mobility    Modified Rankin (Stroke Patients Only) Modified Rankin (Stroke Patients Only) Pre-Morbid Rankin  Score: No significant disability Modified Rankin: Moderately severe disability     Balance Overall balance assessment:  (pt now dependent on RW for amb)                                  Cognition Arousal/Alertness: Awake/alert Behavior During Therapy: Anxious ("con't to be scared of unfamilar people right now") Overall Cognitive Status: Impaired/Different from baseline Area of Impairment: Safety/judgement         Safety/Judgement: Decreased awareness of safety;Decreased awareness of deficits     General Comments: pt attempting to get into bed with bedrails ups, max verbals cues to get in properly    Exercises      General Comments General comments (skin integrity, edema, etc.): stepmother con't to be patient primary caregiver, pt very anxious and will not let stepmom leave      Pertinent Vitals/Pain Pain Assessment: Faces Faces Pain Scale: Hurts little more Pain Location: headache Pain Descriptors / Indicators: Headache Pain Intervention(s): Patient requesting pain meds-RN notified    Home Living                      Prior Function            PT Goals (current goals can now be found in the care plan section) Acute Rehab PT Goals Patient Stated Goal: pt unable tos tate Progress towards PT goals: Progressing toward goals    Frequency  Min 2X/week    PT Plan Discharge plan needs to be updated    Co-evaluation             End of Session Equipment Utilized During Treatment: Gait belt Activity Tolerance: Patient tolerated treatment well Patient left: in bed;with call bell/phone within reach;with family/visitor present     Time: 1531-1551 PT Time Calculation (min) (ACUTE ONLY): 20 min  Charges:  $Gait Training: 8-22 mins                    G Codes:      Marcene Brawn 12/03/2015, 4:38 PM   Lewis Shock, PT, DPT Pager #: [phone] Office #: [phone]

## 2015-12-03 NOTE — Consult Note (Signed)
NEURO HOSPITALIST CONSULT NOTE   Requestig physician: Teaching service   Reason for Consult: right sided weakness   History obtained from:  Patient     HPI:                                                                                                                                          Mary [name] is an 40 y.o. female who has been seen at [facility] for possible MS. Per there note she had "White Matter Lesions- patient with multiple white matter lesions, several of them larger and appearance of coalescing with several areas of hypointensity with surround flair signal abnormality. Lesions overall within the deep and subcortical white matter. She additionally has no cervical cord findings and thoracic MRI reviewed as well without evidence of lesion ( no official read by radiology to date). Her MRI brain is not diagnostic for MS and concerning for a different inflammatory or vascular process. Her severe headaches especially raise consideration for this or a genetic condition with stroke episodes such as CADASIL or as can be seen with vasculopathies. CSF studies were additionally unrevealing however complete testing was not done as serum was not used to compare the oligoclonal bands and IGG index." they recommend " for completeness and they are in agreement. Additionally the autoimmune inflammatory workup was notably positive for ANA, ENA with anti-RNP antibodies and mildly positive ANA for completeness of study". ANA returned positive, ENA was elevated 1.4 (positive) and Anti RNP was positive.    Patietn was brought to [facility] hospital due to HA and short term memory loss, and persistent right sided weakness. She has been extensively followed by neurology and rheumatology at [facility] and was to follow up with her neurologist at [facility]. 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 She had been "doing very well on prednisone 10 mg daily for the last 2 months, until she had a another "attack." During this  attack, she reaches towards her head as though she is in severe pain, begins crying, and loses the strength in her right upper and lower extremities."  Neurology was asked to see.     Past Medical History  Diagnosis Date  . Hernia 03-24-13    ventral hernia remains   . Pancreatitis 03-24-13    2002, 2'2013, 03-14-13  . Pancreatic cyst 2002 onset  . Depression     "recent breakup with partner of 15 yrs"  . Adrenal insufficiency (HCC) 04/23/2013    ??  . MS (multiple sclerosis) (HCC)   . Ventral hernia   . GERD (gastroesophageal reflux disease)   . Depression   . Anxiety   . Abdominal hernia     Past Surgical History  Procedure Laterality Date  . Abdominal surgery  ~ 2007    some  sort of pancreatic cyst drainage.   . Cholecystectomy      ~ age 22-laparoscopic  . Adenoidectomy    . 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 Eus N/A 04/14/2013    Procedure: UPPER ENDOSCOPIC ULTRASOUND (EUS) LINEAR;  Surgeon: Rachael Fee, MD;  Location: [facility] ENDOSCOPY;  Service: Endoscopy;  Laterality: N/A;  . Abdominal surgery    . Roux-en-y gastric bypass    . Cholecystectomy      Family History  Problem Relation Age of Onset  . Lupus Neg Hx   . Sarcoidosis Neg Hx   . Pancreatitis Neg Hx   . Ataxia Neg Hx   . Chorea Neg Hx   . Dementia Neg Hx   . Mental retardation Neg Hx   . Migraines Neg Hx   . Multiple sclerosis Neg Hx   . Neurofibromatosis Neg Hx   . Neuropathy Neg Hx   . Parkinsonism Neg Hx   . Seizures Neg Hx   . Stroke Neg Hx   . Colitis Maternal Aunt   . Diabetes Mother   . Diabetes Father   . Diabetes      many family members     Social History:  reports that she quit smoking about 2 years ago. Her smoking use included Cigarettes. She has a 10 pack-year smoking history. She has never used smokeless tobacco. She reports that she does not drink alcohol or use illicit drugs.  Allergies  Allergen Reactions  . Amoxicillin  Anaphylaxis  . Latex Rash  . Morphine And Related Anaphylaxis    unknown  . Penicillins Anaphylaxis  . Meat Extract   . Morphine And Related Nausea And Vomiting  . Buprenorphine Hcl Nausea And Vomiting    MEDICATIONS:                                                                                                                     Prior to Admission:  Prescriptions prior to admission  Medication Sig Dispense Refill Last Dose  . aspirin 81 MG chewable tablet Chew 1 tablet (81 mg total) by mouth daily. 30 tablet 6 11/29/2015 at Unknown time  . azaTHIOprine (IMURAN) 50 MG tablet Take 50 mg by mouth 2 (two) times daily.   11/29/2015 at Unknown time  . azaTHIOprine (IMURAN) 50 MG tablet Take 100 mg by mouth.   11/29/2015 at Unknown time  . clonazePAM (KLONOPIN) 0.5 MG tablet Take 1 tablet (0.5 mg total) by mouth 2 (two) times daily as needed for anxiety. (Patient taking differently: Take 1 mg by mouth 2 (two) times daily as needed for anxiety. ) 30 tablet 0 11/29/2015 at Unknown time  . hydroxychloroquine (PLAQUENIL) 200 MG tablet Take 300 mg by mouth daily.    11/29/2015 at Unknown time  . lipase/protease/amylase (CREON-12/PANCREASE) 12000 UNITS CPEP capsule Take 2 capsules by mouth 3 (three) times daily with meals. 180 capsule 2 11/29/2015 at Unknown time  .  methocarbamol (ROBAXIN) 500 MG tablet Take 1 tablet (500 mg total) by mouth every 6 (six) hours as needed for muscle spasms. 75 tablet 3 11/29/2015 at Unknown time  . Oxycodone HCl 10 MG TABS Take 1 tablet (10 mg total) by mouth every 6 (six) hours as needed (severe pain). 120 tablet 0 11/29/2015 at Unknown time  . pantoprazole (PROTONIX) 40 MG tablet Take 1 tablet (40 mg total) by mouth daily. 90 tablet 3 11/29/2015 at Unknown time  . predniSONE (DELTASONE) 5 MG tablet    11/29/2015 at Unknown time  . sertraline (ZOLOFT) 100 MG tablet Take 200 mg by mouth daily.    11/28/2015 at Unknown time  . topiramate (TOPAMAX) 100 MG tablet Take 0.5-1 tablets  (50-100 mg total) by mouth 2 (two) times daily. Take 100 mg q am and may take 50 mg pm for headache and neuropathic pain 60 tablet 4 11/29/2015 at Unknown time  . pravastatin (PRAVACHOL) 10 MG tablet Take 1 tablet (10 mg total) by mouth at bedtime. 30 tablet 3 Taking  . promethazine (PHENERGAN) 25 MG tablet TAKE 1 TABLET BY MOUTH EVERY 6 HOURS AS NEEDED FOR NAUSEA/VOMITING 30 tablet 3 Taking   Scheduled: . aspirin  81 mg Oral Daily  . azaTHIOprine  200 mg Oral BID  . cyanocobalamin  1,000 mcg Intramuscular Once  . enoxaparin (LOVENOX) injection  40 mg Subcutaneous Q24H  . hydroxychloroquine  300 mg Oral Daily  . ketorolac  30 mg Intravenous 4 times per day  . lipase/protease/amylase  2 capsule Oral TID WC  . methylPREDNISolone (SOLU-MEDROL) injection  125 mg Intravenous Daily  . pantoprazole  40 mg Oral Daily  . sertraline  200 mg Oral Daily  . topiramate  100 mg Oral q morning - 10a   And  . topiramate  50 mg Oral QHS     ROS:                                                                                                                                       History obtained from the patient  General ROS: negative for - chills, fatigue, fever, night sweats, weight gain or weight loss Psychological ROS: negative for - behavioral disorder, hallucinations, memory difficulties, mood swings or suicidal ideation Ophthalmic ROS: negative for - blurry vision, double vision, eye pain or loss of vision ENT ROS: negative for - epistaxis, nasal discharge, oral lesions, sore throat, tinnitus or vertigo Allergy and Immunology ROS: negative for - hives or itchy/watery eyes Hematological and Lymphatic ROS: negative for - bleeding problems, bruising or swollen lymph nodes Endocrine ROS: negative for - galactorrhea, hair pattern changes, polydipsia/polyuria or temperature intolerance Respiratory ROS: negative for - cough, hemoptysis, shortness of breath or wheezing Cardiovascular ROS: negative for -  chest pain, dyspnea on exertion, edema or irregular heartbeat Gastrointestinal ROS: negative for - abdominal pain, diarrhea, hematemesis, nausea/vomiting or stool incontinence Genito-Urinary ROS:  negative for - dysuria, hematuria, incontinence or urinary frequency/urgency Musculoskeletal ROS: negative for - joint swelling or muscular weakness Neurological ROS: as noted in HPI Dermatological ROS: negative for rash and skin lesion changes   Blood pressure 106/61, pulse 61, temperature 98 F (36.7 C), temperature source Oral, resp. rate 16, SpO2 98 %.   Neurologic Examination:                                                                                                      HEENT-  Normocephalic, no lesions, without obvious abnormality.  Normal external eye and conjunctiva.  Normal TM's bilaterally.  Normal auditory canals and external ears. Normal external nose, mucus membranes and septum.  Normal pharynx. Cardiovascular- S1, S2 normal, pulses palpable throughout   Lungs- chest clear, no wheezing, rales, normal symmetric air entry Abdomen- normal findings: bowel sounds normal Extremities- no edema Lymph-no adenopathy palpable Musculoskeletal-no joint tenderness, deformity or swelling Skin-warm and dry, no hyperpigmentation, vitiligo, or suspicious lesions  Neurological Examination Mental Status: Alert, oriented, thought content appropriate.  Speech fluent without evidence of aphasia.  Able to follow 3 step commands without difficulty. Cranial Nerves: II:  Visual fields grossly normal, pupils equal, round, reactive to light and accommodation III,IV, VI: ptosis not present, extra-ocular motions intact bilaterally V,VII: smile symmetric, facial light touch sensation splits midline to tuning fork and light sensation VIII: hearing normal bilaterally IX,X: uvula rises symmetrically XI: bilateral shoulder shrug XII: midline tongue extension Motor: Right : Upper extremity   3/5    Left:      Upper extremity   5/5  Lower extremity   3/5     Lower extremity   5/5 --give way strength on the right with poor effort Tone and bulk:normal tone throughout; no atrophy noted Sensory: Pinprick and light touch decreased on the right splitting midline to trunk Deep Tendon Reflexes: 2+ and symmetric throughout Plantars: Right: downgoing   Left: downgoing Cerebellar: normal finger-to-nose and normal heel-to-shin test Gait: not tested      Lab Results: Basic Metabolic Panel:  Recent Labs Lab 11/29/15 1343 12/02/15 0250 12/02/15 1324 12/03/15 0653  NA 141 141  --  144  K 3.9 3.4*  --  3.9  CL 112* 111  --  115*  CO2 16* 21*  --  19*  GLUCOSE 167* 97  --  86  BUN 11 13  --  11  CREATININE 0.69 0.59  --  0.58  CALCIUM 9.0 8.4*  --  8.5*  MG  --   --  2.0  --     Liver Function Tests:  Recent Labs Lab 11/29/15 1343  AST 17  ALT 11*  ALKPHOS 85  BILITOT 0.5  PROT 7.6  ALBUMIN 3.9   No results for input(s): LIPASE, AMYLASE in the last 168 hours. No results for input(s): AMMONIA in the last 168 hours.  CBC:  Recent Labs Lab 11/29/15 1343 12/02/15 0250  WBC 10.2 9.0  NEUTROABS 8.8*  --   HGB 12.7 10.5*  HCT 38.9 33.1*  MCV 92.2 92.2  PLT 339  276    Cardiac Enzymes: No results for input(s): CKTOTAL, CKMB, CKMBINDEX, TROPONINI in the last 168 hours.  Lipid Panel: No results for input(s): CHOL, TRIG, HDL, CHOLHDL, VLDL, LDLCALC in the last 168 hours.  CBG:  Recent Labs Lab 11/29/15 1333  GLUCAP 153*    Microbiology: Results for orders placed or performed during the hospital encounter of 04/22/15  Urine culture     Status: None   Collection Time: 04/22/15 10:45 AM  Result Value Ref Range Status   Specimen Description URINE, CATHETERIZED  Final   Special Requests NONE  Final   Culture NO GROWTH 1 DAY  Final   Report Status 04/23/2015 FINAL  Final  Culture, blood (routine x 2)     Status: None   Collection Time: 04/22/15  3:00 PM  Result Value  Ref Range Status   Specimen Description BLOOD LEFT HAND  Final   Special Requests BOTTLES DRAWN AEROBIC ONLY 3CC POF ON ROCEPHIN  Final   Culture NO GROWTH 5 DAYS  Final   Report Status 04/27/2015 FINAL  Final  Culture, blood (routine x 2)     Status: None   Collection Time: 04/22/15  3:15 PM  Result Value Ref Range Status   Specimen Description BLOOD RIGHT HAND  Final   Special Requests BOTTLES DRAWN AEROBIC ONLY 5CC  POF ON ROCEPHIN  Final   Culture NO GROWTH 5 DAYS  Final   Report Status 04/27/2015 FINAL  Final    Coagulation Studies: No results for input(s): LABPROT, INR in the last 72 hours.  Imaging: Mr Lodema Pilot Contrast  12/01/2015  CLINICAL DATA:  Headache, short-term memory loss, and RIGHT hemiparesis. EXAM: MRI HEAD WITHOUT AND WITH CONTRAST TECHNIQUE: Multiplanar, multiecho pulse sequences of the brain and surrounding structures were obtained without and with intravenous contrast. CONTRAST:  20mL MULTIHANCE GADOBENATE DIMEGLUMINE 529 MG/ML IV SOLN COMPARISON:  07/22/2015 MRI is most recent. FINDINGS: Unchanged moderate to severe white matter lesions, patchy to confluent supratentorial and pontine involvement, largely sparing the anterior temporal lobes. No restricted diffusion. The hypointensity on T1 weighted images, in some locations, is periventricular, but overall the white matter loss does not resemble the typical black holes of multiple sclerosis. No progression of disease from December 2016. Post infusion, no abnormal enhancement. Overall normal cerebral volume. No hemorrhage or susceptibility. No features of large vessel occlusion. Extracranial soft tissues unremarkable. 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  Other considerations would include primary angiitis of the CNS, MELAS, or sporadic subcortical arteriosclerotic encephalopathy (sSAE). Electronically Signed   By: Elsie Stain M.D.   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 Patient has been followed very closely by both neurology and Rheumatology at [facility]. She was admitted for new onset weakness on the right and HA.  Exam shows Functional right sided weakness, splitting midline to tuning fork and light touch in addition to wrong sided weakness when testing SCM.    Will be seen by Dr. Lavon Paganini. Please see his attestation note for A/P for any additional work up recommendations.

## 2015-12-04 ENCOUNTER — Inpatient Hospital Stay (HOSPITAL_COMMUNITY)
Admit: 2015-12-04 | Discharge: 2015-12-04 | Disposition: A | Discharge status: Home / Self Care | Payer: Medicaid Other | Attending: Neurology | Admitting: Neurology

## 2015-12-04 DIAGNOSIS — R4182 Altered mental status, unspecified: Secondary | ICD-10-CM

## 2015-12-04 DIAGNOSIS — F449 Dissociative and conversion disorder, unspecified: Secondary | ICD-10-CM

## 2015-12-04 MED ORDER — PREDNISONE 5 MG PO TABS: 5.0000 mg | ORAL_TABLET | Freq: Every day | ORAL | Status: DC

## 2015-12-04 MED ORDER — VITAMIN B-12 1000 MCG PO TABS: 1000.0000 ug | ORAL_TABLET | Freq: Every day | ORAL | Status: DC

## 2015-12-04 MED ORDER — AZATHIOPRINE 50 MG PO TABS: 200.0000 mg | ORAL_TABLET | Freq: Two times a day (BID) | ORAL | Status: DC

## 2015-12-04 MED ORDER — MELOXICAM 15 MG PO TABS: 15.0000 mg | ORAL_TABLET | Freq: Every day | ORAL | Status: DC

## 2015-12-04 MED ORDER — KETOROLAC TROMETHAMINE 30 MG/ML IJ SOLN
30.0000 mg | Freq: Once | INTRAMUSCULAR | Status: AC
  Administered 2015-12-04: 30 mg via INTRAVENOUS
  Filled 2015-12-04: qty 1

## 2015-12-04 MED ORDER — KETOROLAC TROMETHAMINE 15 MG/ML IJ SOLN
15.0000 mg | Freq: Four times a day (QID) | INTRAMUSCULAR | Status: DC | PRN
  Administered 2015-12-04 – 2015-12-05 (×3): 15 mg via INTRAVENOUS
  Filled 2015-12-04 (×3): qty 1

## 2015-12-04 MED ORDER — PROMETHAZINE HCL 25 MG PO TABS
25.0000 mg | ORAL_TABLET | Freq: Four times a day (QID) | ORAL | Status: DC | PRN
  Administered 2015-12-04: 25 mg via ORAL
  Filled 2015-12-04: qty 1

## 2015-12-04 MED ORDER — PREDNISONE 20 MG PO TABS: 20.0000 mg | ORAL_TABLET | Freq: Every day | ORAL | Status: DC

## 2015-12-04 MED ORDER — PROMETHAZINE HCL 12.5 MG PO TABS: 6.2500 mg | ORAL_TABLET | Freq: Four times a day (QID) | ORAL | Status: DC | PRN

## 2015-12-04 NOTE — Evaluation (Signed)
Speech Language Pathology Evaluation Patient Details Name: Mary [name] MRN: [id number] DOB: 02-20-1976 Today's Date: 12/04/2015 Time: 3546-5681 SLP Time Calculation (min) (ACUTE ONLY): 34 min  Problem List:  Patient Active Problem List   Diagnosis Date Noted  . Headache 11/29/2015  . Major depressive disorder, recurrent episode, moderate (HCC) 07/23/2015  . TIA (transient ischemic attack) 07/22/2015  . Vascular headache 07/22/2015  . Lupus (HCC) 07/22/2015  . Intractable chronic migraine without aura and without status migrainosus   . Conversion disorder, acute episode, with weakness or paralysis   . Systemic lupus erythematosus related syndrome (HCC) 05/21/2015  . Stuttering   . UTI (lower urinary tract infection) 04/22/2015  . Fever   . Cephalalgia   . Right sided weakness   . Functional neurologic complaint   . Right sided weakness   . White matter changes   . Multiple sclerosis (HCC) 12/29/2014  . Right hemiparesis (HCC) 11/01/2014  . Lumbar radiculopathy 11/01/2014  . Low back pain 11/01/2014  . 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 Pancreatic pseudocyst 10/02/2014  . Nausea with vomiting 10/02/2014  . Nerve pain 09/25/2014  . Fever 04/01/2014  . Idiopathic acute pancreatitis 03/11/2014  . Nausea and vomiting 03/11/2014  . Abdominal pain, epigastric 03/11/2014  . Adjustment disorder with mixed anxiety and depressed mood 02/23/2014  . Thrush, oral 02/12/2014  . GERD (gastroesophageal reflux disease) 02/11/2014  . Abdominal pain 07/26/2013  . Nausea and vomiting in adult 07/26/2013  . Pancreatitis, recurrent (HCC) 06/14/2013  . Low serum cortisol level (HCC) 04/25/2013  . ? Adrenal insufficiency- Work up negative 04/23/2013  . Chronic pancreatitis with chronic pseudocysts 04/23/2013  . Abnormal MRI 04/23/2013  . Persistent vomiting 04/22/2013  . Diarrhea 04/21/2013  . Anemia 04/20/2013  . Protein-calorie malnutrition,  severe (HCC) 04/16/2013  . Obesity, morbid (HCC) 03/15/2013  . Pancreatic cyst 03/15/2013  . Acute inflammation of the pancreas 03/15/2013  . Cyst and pseudocyst of pancreas 03/15/2013  . Extreme obesity (HCC) 03/15/2013   Past Medical History:  Past Medical History  Diagnosis Date  . Hernia 03-24-13    ventral hernia remains   . Pancreatitis 03-24-13    2002, 2'2013, 03-14-13  . Pancreatic cyst 2002 onset  . Depression     "recent breakup with partner of 15 yrs"  . Adrenal insufficiency (HCC) 04/23/2013    ??  . MS (multiple sclerosis) (HCC)   . Ventral hernia   . GERD (gastroesophageal reflux disease)   . Depression   . Anxiety   . Abdominal hernia    Past Surgical History:  Past Surgical History  Procedure Laterality Date  . Abdominal surgery  ~ 2007    some sort of pancreatic cyst drainage.   . Cholecystectomy      ~ age 73-laparoscopic  . Adenoidectomy    . 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 Eus N/A 04/14/2013    Procedure: UPPER ENDOSCOPIC ULTRASOUND (EUS) LINEAR;  Surgeon: Rachael Fee, MD;  Location: [facility];  Service: Endoscopy;  Laterality: N/A;  . Abdominal surgery    . Roux-en-y gastric bypass    . Cholecystectomy     HPI:  [name] is a 40 year old lady with a mysterious medical history with mention of systemic lupus erythematosis and CADASIL presenting with headache followed by short term memory loss and right hemiparesis.   Assessment / Plan / Recommendation Clinical Impression  Cognitive-linguistic  evaluation complete for imminent discharge today.  Patient disoriented to time, place and situation and only oriented to first name.  Patient was observed to sustained attention to task for ~30 seconds and required repetitions to complete simple tasks.  Patient able to follow simple 1 step commands with extra time and reps, as well as state 1 word answers in most opportunities with extra time.  Speech is  intelligible with intermittent dysfluencies, which appear to occur more with phrase length utterances.  Reading and writing not intact at the word-phrase level.  Mother, who is present states that patient is able to state basic needs and wants for self-care as well as complete automatic tasks such as brushing teeth and feeding self when needed.  Patient has 24/7 Supervision available from Mother.  As a result, recommend that all skilled SLP services can be addressed at the next level of care with home health services.      SLP Assessment  All further Speech Lanaguage Pathology  needs can be addressed in the next venue of care    Follow Up Recommendations  Home health SLP;24 hour supervision/assistance               SLP Evaluation Prior Functioning  Cognitive/Linguistic Baseline: Within functional limits Type of Home: House  Lives With: Other (Comment) (Mom) Available Help at Discharge: Family;Available 24 hours/day   Cognition  Overall Cognitive Status: Impaired/Different from baseline Arousal/Alertness: Awake/alert Orientation Level: Oriented to person;Disoriented to place;Disoriented to time;Disoriented to situation Attention: Sustained Sustained Attention: Impaired Sustained Attention Impairment: Verbal basic Memory: Impaired Memory Impairment: Storage deficit;Retrieval deficit;Decreased recall of new information Awareness: Impaired Awareness Impairment: Emergent impairment Problem Solving: Impaired Problem Solving Impairment: Verbal basic;Functional basic Executive Function:  (all impaired due to lower level deficits) Safety/Judgment: Impaired    Comprehension  Auditory Comprehension Overall Auditory Comprehension: Impaired Yes/No Questions: Impaired Basic Biographical Questions: 26-50% accurate Commands: Impaired One Step Basic Commands: 75-100% accurate Two Step Basic Commands: 50-74% accurate Conversation: Simple Interfering Components: Attention;Processing  speed;Working Radio broadcast assistant: Mining engineer: Not tested Reading Comprehension Reading Status: Impaired Word level: Impaired Sentence Level: Impaired    Expression Expression Primary Mode of Expression: Verbal Verbal Expression Overall Verbal Expression: Impaired Initiation: Impaired Automatic Speech: Social Response;Counting Level of Generative/Spontaneous Verbalization: Word Repetition: Impaired Pragmatics: Impairment Impairments: Abnormal affect;Eye contact Interfering Components: Attention Effective Techniques: Other (Comment) (choice of two) Non-Verbal Means of Communication: Not applicable   Oral / Motor  Oral Motor/Sensory Function Overall Oral Motor/Sensory Function:  (difficult to assess) Motor Speech Overall Motor Speech: Other (comment) (inconsistent dysfluencies but intact intelligibility)   GO                   Fae Pippin, M.A., CCC-SLP [phone]  Sharlena Kristensen 12/04/2015, 1:18 PM

## 2015-12-04 NOTE — Progress Notes (Signed)
Caretaker requesting for toradol for patient at this time verbalized that pt needs this medication for her current HA or her memory will be jeopardized. Spoke with MD during round this AM. MD paged again per care taker request. Side effects of toradol reviewed with care taker & Pt. Dr. Earnest Conroy returned call for RN to order x1 dose toradol  at this time. Patient noted resting quietly in bed at this time.   Sim Boast, RN

## 2015-12-04 NOTE — Progress Notes (Signed)
EEG Completed; Results Pending  

## 2015-12-04 NOTE — Progress Notes (Signed)
Staffs notice patient using vape/e cig. Staff requests to remove item but mom asked to keep it. RN offered nicotine patch but caretaker refused. Pt was able to respond to staff in a full sofly sentence of "can I give it to her" while directly looking at caretaker. No other distress noted. Will continue to monitor.  Sim Boast, RN

## 2015-12-04 NOTE — Progress Notes (Signed)
Patient ID: Mary [name], female   DOB: 1976-02-02, 40 y.o.   MRN: [id number]   Subjective: Mary [name] is more approachable today and is not as fearful of strangers. She is seen smiling more. She continues to endorse a headache in the occipital region, and says he continues to get "attacks."   When asked where she wanted to be today, the patient said, "Home."   Objective: Vital signs in last 24 hours: Filed Vitals:   12/04/15 0141 12/04/15 0530 12/04/15 1010 12/04/15 1012  BP: 109/51 105/69 82/42 124/65  Pulse: 70 69 68   Temp: 97.4 F (36.3 C) 98.2 F (36.8 C) 97.9 F (36.6 C)   TempSrc: Oral Oral Oral   Resp: SpO2: 98% 99% 99%       General: young lady with numerous tattoos, NAD Cardiac: regular rate and rhythm, no rubs, murmurs or gallops Pulm: breathing well, clear to auscultation bilaterally Abd: bowel sounds normal, soft, nondistended, non-tender Ext: warm and well perfused, without pedal edema Neuro: Speech is slow. Able to repeat short phrases without difficulty. Oriented to person today. Oriented to place with choices. Not oriented to year, but knew to look at board for date. RUE and RLE strength 4-4+/5 compared to 5/5 on left, but patient noted to use full strength on RUE to lift her 217 lb body to reposition for the lung exam. Weak right sided shoulder shrug, but also weak turning head to the left, not consistent with a CNXI lesion. Normal FTN. Endorses diminished light touch sensation in right temporal area of facial nerve, sparing the rest of the face. She endorsed diminished light touch sensation on the right.  Psychiatric: Affect is less flat and more bright today     Lab Results: Basic Metabolic Panel:  Recent Labs Lab 12/02/15 0250 12/02/15 1324 12/03/15 0653  NA 141  --  144  K 3.4*  --  3.9  CL 111  --  115*  CO2 21*  --  19*  GLUCOSE 97  --  86  BUN 13  --  11  CREATININE 0.59  --  0.58  CALCIUM 8.4*  --  8.5*  MG  --  2.0  --     Medications: I have reviewed the patient's current medications. Scheduled Meds: . aspirin  81 mg Oral Daily  . azaTHIOprine  200 mg Oral BID  . cyanocobalamin  1,000 mcg Intramuscular Once  . enoxaparin (LOVENOX) injection  40 mg Subcutaneous Q24H  . hydroxychloroquine  300 mg Oral Daily  . lipase/protease/amylase  2 capsule Oral TID WC  . methylPREDNISolone (SOLU-MEDROL) injection  125 mg Intravenous Daily  . pantoprazole  40 mg Oral Daily  . sertraline  200 mg Oral Daily  . topiramate  100 mg Oral q morning - 10a   And  . topiramate  50 mg Oral QHS   Continuous Infusions:  PRN Meds:.clonazePAM, methocarbamol, oxyCODONE, polyvinyl alcohol   Assessment/Plan:   Ms. Jacot is a 40 year old lady with a mysterious medical history with mention of systemic lupus erythematosis and CADASIL presenting with headache followed by short term memory loss and right hemiparesis that has not responded to steroids.  Headache, short term memory loss, and right hemiparesis: This is an incredibly odd and vague neurologic presentation; objectively-speaking she has had recurrent pancreatitis, periventricular white matter changes on her brain MRI suggestive of CADASIL, ANA+, dsDNA+, and anti-RNP+. My hunch is that this is indeed CADASIL or some sort of organic  neurovascular disease with confounding psychiatric symptoms, as her strength is normal when she is not being directly observed and she has inconsistent neurological findings. Because her symptoms have persisted despite high-dose steroids, I do not think this is a lupus flare; moreover dsDNA was normal.  We are awaiting final recommendations from the neurologist, SLP speech-cognition, and her rheumatologist, Dr. Sharmon Revere. -Continue IV ketorolac 30mg  once this AM, will transition to Meloxicam on discharge -Continue IV solumedrol 125mg  daily for now, will transition to oral prednisone with taper to her home dose of 10 mg daily on discharge  -Continue  aspirin 81mg  daily -Continue hydroxychloroquine 300mg  daily -Continue azathioprine 200mg  twice daily -Continue sertraline 200mg  daily -Continue oxycodone 5mg  every 8 hours as needed for pain -Continue methocarbamol 500mg  every 6 hours as needed for muscle spasms -Continue clonazepam 1mg  twice daily as needed for anxiety -Continue topiramate 100mg  every morning and 50mg  every evening -NOTCH3 gene mutation pending  Dispo: Anticipated discharge home today pending final recommendations from neurology, SLP cognitive-speech evaluation, and her outpatient rheumatologist  The patient does have a current PCP Mary Hansen, NP) and does need an [facility] hospital follow-up appointment after discharge.  The patient does have transportation limitations that hinder transportation to clinic appointments.  .Services Needed at time of discharge: Y = Yes, Blank = No PT:   OT:   RN:   Equipment:   Other:     LOS: 4 days   Ruben Im, MD 12/04/2015, 10:18 AM

## 2015-12-04 NOTE — Progress Notes (Addendum)
Per caretaker at bedside, pt has some red tinge blood around her rectal area but nothing draining. Pt with stool during this episode. Pt also c/o abdomen pain and caretaker verbalized that it possibly her pancreatitis exacerbated. Dr. Earnest Conroy aware. MD verbalized that someone will see patient soon and will not be discharge today.   Jadrien Narine, RN.

## 2015-12-04 NOTE — Discharge Instructions (Signed)
Ms. Kuder,  It was joy taking care of you in the hospital. You seem to have made some improvement since you've been here. We have transitioned you to steroids you can take by mouth. You will take decreasing doses of prednisone as outlined below. You will take the 20 mg prednisone first and transition to two tablets of the 5 mg tablets daily once the 20 mg tablets are finished.  We also found that your B12 level was low. We gave you B12 shots while you were here, which you should continue as an outpatient. It may take a couple more weeks of shots 3-7 days a week. In the mean time, we have given you some B12 tablets.   Day 1: 120 mg prednisone, six 20 mg tablets 2: 120 mg, 6 tablets 3: 100 mg, 5 tablets 4: 100 mg, 5 tablets 5: 80 mg, 4 tablets 6: 80 mg, 4 tablets 7: 60 mg, 3 tablets 8: 60 mg, 3 tablets 9: 40 mg, 2 tablets 10: 40 mg, 2 tablets 11: 20 mg, 1 tablet 12: 20 mg, 1 tablet 13: Switch to 2 tablets of the 5 mg prednisone once a day.  For your headaches, you received Toradol by vein. We have transitioned this to a short course of meloxicam. You will take it once a day.  While it is uncertain what is leading to your bouts of confusion, we are awaiting a genetic test, the results which will be informative. We have informed your doctors to follow up on this genetic test.  Once again, it is very important you regularly follow up with your rheumatologist and primary doctor.  It was a pleasure taking care of you here at [facility].

## 2015-12-04 NOTE — Progress Notes (Signed)
Caretaker called RN to give pt pain medication. RN came with medication but caretaker refused and request nausea med instead. MD called and order received.   Sim Boast, RN

## 2015-12-05 MED ORDER — MELOXICAM 15 MG PO TABS: 15.0000 mg | ORAL_TABLET | Freq: Every day | ORAL | Status: DC

## 2015-12-05 MED ORDER — PREDNISONE 20 MG PO TABS
40.0000 mg | ORAL_TABLET | Freq: Every day | ORAL | Status: DC
  Administered 2015-12-05: 40 mg via ORAL
  Filled 2015-12-05: qty 2

## 2015-12-05 NOTE — Progress Notes (Signed)
Patient ID: Mary [name], female   DOB: Nov 15, 1975, 40 y.o.   MRN: [id number]   Subjective: Mary [name]'s caregiver, Verlon Au, tells me Mary [name] continues to be about the same. She's anxious about transitioning home but has a good game plan in place and is ready to go home today. She is seeing a psychiatrist and she is interested in additionally seeing a neurologist on an outpatient basis.  Objective: Vital signs in last 24 hours: Filed Vitals:   12/04/15 2118 12/04/15 2317 12/05/15 0126 12/05/15 0535  BP: 105/68 132/88 106/59 110/69  Pulse: 78 67 74 72  Temp: 97.1 F (36.2 C)  97.9 F (36.6 C) 98.1 F (36.7 C)  TempSrc: Oral  Oral Oral  Resp: 18 18 18 18   SpO2: 100% 99% 98% 99%   General: resting in bed comfortably, responding yes and no to questions Cardiac: regular rate and rhythm, no rubs, murmurs or gallops Pulm: breathing well, clear to auscultation bilaterally Ext: warm and well perfused, without pedal edema  Medications: I have reviewed the patient's current medications. Scheduled Meds: . aspirin  81 mg Oral Daily  . azaTHIOprine  200 mg Oral BID  . cyanocobalamin  1,000 mcg Intramuscular Once  . enoxaparin (LOVENOX) injection  40 mg Subcutaneous Q24H  . hydroxychloroquine  300 mg Oral Daily  . lipase/protease/amylase  2 capsule Oral TID WC  . pantoprazole  40 mg Oral Daily  . predniSONE  40 mg Oral Q breakfast  . sertraline  200 mg Oral Daily  . topiramate  100 mg Oral q morning - 10a   And  . topiramate  50 mg Oral QHS   Continuous Infusions:  PRN Meds:.clonazePAM, ketorolac, methocarbamol, oxyCODONE, polyvinyl alcohol, promethazine   Assessment/Plan:  [name] is a 40 year old lady with a mysterious medical history with mention of systemic lupus erythematosis and CADASIL presenting with headache followed by short term memory loss and right hemiparesis that has not responded to steroids.  Headache, short term memory loss, and right hemiparesis: The true diagnosis  remains elusive but I suspect this is indeed CADASIL with a superimposed psychiatric component. She is ready for discharge today after we touch base with Neurology. -Will touch base with neurology to assist with outpatient follow-up -Continue IV ketorolac 30mg  this AM; will transition to Meloxicam on discharge -Prednisone 40mg  daily; will taper back to 10mg  daily over two weeks upon discharge -Continue aspirin 81mg  daily -Continue hydroxychloroquine 300mg  daily -Continue azathioprine 200mg  twice daily -Continue sertraline 200mg  daily -Continue oxycodone 5mg  every 8 hours as needed for pain -Continue methocarbamol 500mg  every 6 hours as needed for muscle spasms -Continue clonazepam 1mg  twice daily as needed for anxiety -Continue topiramate 100mg  every morning and 50mg  every evening -NOTCH3 gene mutation pending  Dispo: Disposition is deferred at this time, awaiting improvement of current medical problems.  Anticipated discharge in approximately 0 day(s).   The patient does have a current PCP [name], NP) and does need an [facility] hospital follow-up appointment after discharge.  The patient does have transportation limitations that hinder transportation to clinic appointments.  .Services Needed at time of discharge: Y = Yes, Blank = No PT:   OT:   RN:   Equipment:   Other:     LOS: 5 days   [name], MD 12/05/2015, 8:24 AM

## 2015-12-05 NOTE — Procedures (Signed)
History: Mary [name] is an 40 y.o. female patient with altered mental status. Routine inpatient EEG was performed for further evaluation.   Patient Active Problem List   Diagnosis Date Noted  . Headache 11/29/2015  . Major depressive disorder, recurrent episode, moderate (HCC) 07/23/2015  . TIA (transient ischemic attack) 07/22/2015  . Vascular headache 07/22/2015  . Lupus (HCC) 07/22/2015  . Intractable chronic migraine without aura and without status migrainosus   . Conversion disorder, acute episode, with weakness or paralysis   . Systemic lupus erythematosus related syndrome (HCC) 05/21/2015  . Stuttering   . UTI (lower urinary tract infection) 04/22/2015  . Fever   . Cephalalgia   . Right sided weakness   . Functional neurologic complaint   . Right sided weakness   . White matter changes   . Multiple sclerosis (HCC) 12/29/2014  . Right hemiparesis (HCC) 11/01/2014  . Lumbar radiculopathy 11/01/2014  . Low back pain 11/01/2014  . 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 Pancreatic pseudocyst 10/02/2014  . Nausea with vomiting 10/02/2014  . Nerve pain 09/25/2014  . Fever 04/01/2014  . Idiopathic acute pancreatitis 03/11/2014  . Nausea and vomiting 03/11/2014  . Abdominal pain, epigastric 03/11/2014  . Adjustment disorder with mixed anxiety and depressed mood 02/23/2014  . Thrush, oral 02/12/2014  . GERD (gastroesophageal reflux disease) 02/11/2014  . Abdominal pain 07/26/2013  . Nausea and vomiting in adult 07/26/2013  . Pancreatitis, recurrent (HCC) 06/14/2013  . Low serum cortisol level (HCC) 04/25/2013  . ? Adrenal insufficiency- Work up negative 04/23/2013  . Chronic pancreatitis with chronic pseudocysts 04/23/2013  . Abnormal MRI 04/23/2013  . Persistent vomiting 04/22/2013  . Diarrhea 04/21/2013  . Anemia 04/20/2013  . Protein-calorie malnutrition, severe (HCC) 04/16/2013  . Obesity, morbid (HCC) 03/15/2013  . Pancreatic  cyst 03/15/2013  . Acute inflammation of the pancreas 03/15/2013  . Cyst and pseudocyst of pancreas 03/15/2013  . Extreme obesity (HCC) 03/15/2013     Current facility-administered medications:  .  aspirin chewable tablet 81 mg, 81 mg, Oral, Daily, [name], MD, 81 mg at 12/04/15 0846 .  azaTHIOprine (IMURAN) tablet 200 mg, 200 mg, Oral, BID, [name], MD, 200 mg at 12/04/15 2147 .  clonazePAM (KLONOPIN) tablet 1 mg, 1 mg, Oral, BID PRN, Beather Arbour, MD, 1 mg at 12/04/15 2150 .  cyanocobalamin ((VITAMIN B-12)) injection 1,000 mcg, 1,000 mcg, Intramuscular, Once, Ruben Im, MD, 1,000 mcg at 12/03/15 1013 .  enoxaparin (LOVENOX) injection 40 mg, 40 mg, Subcutaneous, Q24H, Rushil Terrilee Croak, MD, 40 mg at 12/04/15 2147 .  hydroxychloroquine (PLAQUENIL) tablet 300 mg, 300 mg, Oral, Daily, [name], MD, 300 mg at 12/04/15 0844 .  ketorolac (TORADOL) 15 MG/ML injection 15 mg, 15 mg, Intravenous, Q6H PRN, [name], MD, 15 mg at 12/05/15 0529 .  lipase/protease/amylase (CREON) capsule 24,000 Units, 2 capsule, Oral, TID WC, Rushil Terrilee Croak, MD, 24,000 Units at 12/05/15 1308 .  methocarbamol (ROBAXIN) tablet 500 mg, 500 mg, Oral, Q6H PRN, [name], MD, 500 mg at 12/05/15 0234 .  oxyCODONE (Oxy IR/ROXICODONE) immediate release tablet 5 mg, 5 mg, Oral, Q8H PRN, Ruben Im, MD, 5 mg at 12/05/15 0430 .  pantoprazole (PROTONIX) EC tablet 40 mg, 40 mg, Oral, Daily, [name], MD, 40 mg at 12/04/15 0845 .  polyvinyl alcohol (LIQUIFILM TEARS) 1.4 % ophthalmic solution 1 drop, 1 drop, Both Eyes, PRN, Burns Spain, MD, 1 drop at 12/03/15 0401 .  predniSONE (DELTASONE) tablet  40 mg, 40 mg, Oral, Q breakfast, [name], MD, 40 mg at 12/05/15 [phone] .  promethazine (PHENERGAN) tablet 25 mg, 25 mg, Oral, Q6H PRN, [name], MD, 25 mg at 12/04/15 1813 .  sertraline (ZOLOFT) tablet 200 mg, 200 mg, Oral, Daily, [name], MD, 200 mg at 12/04/15 0845 .  topiramate (TOPAMAX) tablet 100 mg, 100  mg, Oral, q morning - 10a, 100 mg at 12/04/15 0846 **AND** topiramate (TOPAMAX) tablet 50 mg, 50 mg, Oral, QHS, Burns Spain, MD, 50 mg at 12/04/15 2147   Introduction:  This is a 19 channel routine scalp EEG performed at the bedside with bipolar and monopolar montages arranged in accordance to the international 10/20 system of electrode placement. One channel was dedicated to EKG recording.   Findings:  The background rhythm was normal 9-10 Hz alpha . No definite evidence of abnormal epileptiform discharges or electrographic seizures were noted during this recording. Patient was reportedly unresponsive to commands and had some slow writhing left upper extremity movements during the later part of the EEG. No abnormal EEG activity noted to suggest seizure.   Impression:  Unremarkable routine inpatient EEG. Clinical correlation is recommended .

## 2015-12-05 NOTE — Progress Notes (Signed)
Caregiver to bedside and spoke with RN about patient memory declining and personality changes as reported by female caregiver.  Patient able to respond to RN when called. Patient seems fearful while in bed as RN calls patient by name to assess or administer administers. Caregiver remained at bedside and help to reassure patient that everything is ok. RN checked patient vitals signs after caregiver repeatedly stated that she is different today. Charge RN notified and reported to bedside with primary RN. No signs of SOB. Vital signs stable at the time. RN communicated stable VS to caregiver. Caregiver stated that he was going to still speak with the physician tomorrow about same changes noted ongoingly. No c/o nausea or vomiting. Communicated plan of care and medications to caregivers X2 during shift. Per caregiver and patientt c/o pain, pt has received toradol 15mg  IV  Prn, robaxin po prn, and oxycodone 5mg  po prn for complaints of muscle and pain control as needed. Caregiver stated that she still has concerns about patient plan of care and will follow up with the MD. Callbell in reach. Will continue to monitor.

## 2015-12-05 NOTE — Care Management Note (Signed)
Case Management Note  Patient Details  Name: Mary [name] MRN: [id number] Date of Birth: [birth year]  Subjective/Objective:                    Action/Plan: Pt discharging home with orders for [facility] ST. Medicaid will not cover [facility] ST therapy with the patients current diagnosis. CM informed the patients step mother and bedside RN. CM will also inform the MD.   Expected Discharge Date:                  Expected Discharge Plan:  Home/Self Care  In-House Referral:     Discharge planning Services  CM Consult  Post Acute Care Choice:    Choice offered to:     DME Arranged:    DME Agency:     HH Arranged:   (ST--Medicaid will not cover) HH Agency:     Status of Service:  Completed, signed off  Medicare Important Message Given:    Date Medicare IM Given:    Medicare IM give by:    Date Additional Medicare IM Given:    Additional Medicare Important Message give by:     If discussed at Long Length of Stay Meetings, dates discussed:    Additional Comments:  Kermit Balo, RN 12/05/2015, 12:07 PM

## 2015-12-05 NOTE — Progress Notes (Signed)
Pt discharged per orders from MD. Pt and Caregiver educated on discharge instructions. Pt and caregiver verbalized understanding of instructions. Pt's IV was removed before discharge. All questions and concerns were addressed. Pt exited hospital via wheelchair.

## 2015-12-05 NOTE — Progress Notes (Signed)
Caregiver aggressively asking the RN for narcotic medications within close proximity of each other. Rn attempted to counsel caregiver to policy and procedures related to narcotic administration and patient safety. Caregiver not satisfied with physician orders. Received specific instructions during report to use medications with caution on this patient. Caregiver verbalized intent to report to MD change in patient behavior but continues to demand medications prn during the shift. Earlier in shift, patient required sternal rubbing to arouse after toradol administration. Vital signs were normal upon arousal at that time. Will continue to monitor patient closely for safety and educate caregivers on policy. callbell in reach. Will continue to monitor.

## 2015-12-18 LAB — MISC LABCORP TEST (SEND OUT): Labcorp test code: 9985

## 2015-12-21 ENCOUNTER — Encounter: Payer: Self-pay | Admitting: Internal Medicine

## 2015-12-21 NOTE — Progress Notes (Addendum)
I have reviewed the NOTCH3 genetic analysis report from [facility] from 11/30/15. 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 While this makes the diagnosis of CADASIL far less likely, the strong clinical imaging evidence may warrant a punch skin biopsy. I will attempt to call the patient's HCPOA, [name], to inform her of these results.

## 2015-12-24 ENCOUNTER — Encounter: Payer: Medicaid Other | Attending: Physical Medicine & Rehabilitation | Admitting: Physical Medicine & Rehabilitation

## 2015-12-24 ENCOUNTER — Encounter: Payer: Self-pay | Admitting: Physical Medicine & Rehabilitation

## 2015-12-24 VITALS — BP 97/65 | HR 60 | Resp 16

## 2015-12-24 DIAGNOSIS — K861 Other chronic pancreatitis: Secondary | ICD-10-CM | POA: Insufficient documentation

## 2015-12-24 DIAGNOSIS — G894 Chronic pain syndrome: Secondary | ICD-10-CM | POA: Diagnosis not present

## 2015-12-24 DIAGNOSIS — R531 Weakness: Secondary | ICD-10-CM

## 2015-12-24 DIAGNOSIS — M545 Low back pain: Secondary | ICD-10-CM | POA: Insufficient documentation

## 2015-12-24 DIAGNOSIS — M6289 Other specified disorders of muscle: Secondary | ICD-10-CM

## 2015-12-24 DIAGNOSIS — M329 Systemic lupus erythematosus, unspecified: Secondary | ICD-10-CM

## 2015-12-24 DIAGNOSIS — Z79899 Other long term (current) drug therapy: Secondary | ICD-10-CM

## 2015-12-24 DIAGNOSIS — G35 Multiple sclerosis: Secondary | ICD-10-CM | POA: Diagnosis not present

## 2015-12-24 DIAGNOSIS — Z5181 Encounter for therapeutic drug level monitoring: Secondary | ICD-10-CM

## 2015-12-24 MED ORDER — OXYCODONE HCL 10 MG PO TABS: 10.0000 mg | ORAL_TABLET | Freq: Four times a day (QID) | ORAL | Status: DC | PRN

## 2015-12-24 NOTE — Progress Notes (Signed)
Subjective:    Patient ID: Mary [name], female    DOB: 08/29/1975, 40 y.o.   MRN: [id number]  HPI   Mary [name] is here in follow up of her pain and chronic neurological condition. She was admitted again for altered mental status and lethargy. With this recent hospitalization she did not develop substantial weakness, however. I reviewed her lab testing including the NOTCH3 test which was sent out to [facility] clinic. It was negative for mutation. She has scheduled upcoming follow up with rheumatology and her primary. There is still some discussion of a conversion reaction.   She remains on oxycodone for pain control 10mg  q6. She also asked about tramadol and whether this needed to be refilled.    Pain Inventory Average Pain 6 Pain Right Now 9 My pain is constant, sharp, burning, stabbing and tingling  In the last 24 hours, has pain interfered with the following? General activity 4 Relation with others 2 Enjoyment of life 10 What TIME of day is your pain at its worst? night Sleep (in general) Poor  Pain is worse with: Other Pain improves with: medication Relief from Meds: 6  Mobility walk without assistance how many minutes can you walk? 20-30 ability to climb steps?  yes do you drive?  no Do you have any goals in this area?  no  Function disabled: date disabled Pending I need assistance with the following:  meal prep, household duties and shopping Do you have any goals in this area?  no  Neuro/Psych weakness numbness tingling spasms confusion depression anxiety  Prior Studies Any changes since last visit?  yes CT/MRI EEg  Physicians involved in your care Any changes since last visit?  yes Neurologist . Rheumatologist .   Family History  Problem Relation Age of Onset  . Lupus Neg Hx   . Sarcoidosis Neg Hx   . Pancreatitis Neg Hx   . Ataxia Neg Hx   . Chorea Neg Hx   . Dementia Neg Hx   . Mental retardation Neg Hx   . Migraines Neg Hx   . Multiple  sclerosis Neg Hx   . Neurofibromatosis Neg Hx   . Neuropathy Neg Hx   . Parkinsonism Neg Hx   . Seizures Neg Hx   . Stroke Neg Hx   . Colitis Maternal Aunt   . Diabetes Mother   . Diabetes Father   . Diabetes      many family members   Social History   Social History  . Marital Status: Single    Spouse Name: N/A  . Number of Children: N/A  . Years of Education: N/A   Social History Main Topics  . Smoking status: Former Smoker -- 0.50 packs/day for 20 years    Types: Cigarettes    Quit date: 05/22/2013  . Smokeless tobacco: Never Used  . Alcohol Use: No  . Drug Use: No  . Sexual Activity: No   Other Topics Concern  . None   Social History Narrative   ** Merged History Encounter **       Lives permanently in [place] with mom and stepfather.  Has not started working yet and was unemployed in [place].            Past Surgical History  Procedure Laterality Date  . Abdominal surgery  ~ 2007    some sort of pancreatic cyst drainage.   . Cholecystectomy      ~ age 75-laparoscopic  . Adenoidectomy    .  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 Eus N/A 04/14/2013    Procedure: UPPER ENDOSCOPIC ULTRASOUND (EUS) LINEAR;  Surgeon: Rachael Fee, MD;  Location: [facility];  Service: Endoscopy;  Laterality: N/A;  . Abdominal surgery    . Roux-en-y gastric bypass    . Cholecystectomy     Past Medical History  Diagnosis Date  . Hernia 03-24-13    ventral hernia remains   . Pancreatitis 03-24-13    2002, 2'2013, 03-14-13  . Pancreatic cyst 2002 onset  . Depression     "recent breakup with partner of 15 yrs"  . Adrenal insufficiency (HCC) 04/23/2013    ??  . MS (multiple sclerosis) (HCC)   . Ventral hernia   . GERD (gastroesophageal reflux disease)   . Depression   . Anxiety   . Abdominal hernia    BP 97/65 mmHg  Pulse 60  Resp 16  SpO2 97%  Opioid Risk Score:   Fall Risk Score:  `1  Depression screen PHQ  2/9  Depression screen [facility] 2/9 10/01/2015 05/21/2015 11/01/2014  Decreased Interest Down, Depressed, Hopeless 2 - 2  PHQ - 2 Score Altered sleeping - - 1  Tired, decreased energy - - 1  Change in appetite - - 3  Feeling bad or failure about yourself  - - 0  Trouble concentrating - - 1  Moving slowly or fidgety/restless - - 0  PHQ-9 Score - - 10       Review of Systems  Constitutional: Positive for unexpected weight change.  Gastrointestinal: Positive for nausea and abdominal pain.  Endocrine:       Limb Swelling  Musculoskeletal:       Spasms  Neurological: Positive for weakness and numbness.       Tingling  Psychiatric/Behavioral: Positive for confusion.       Anxiety Depression  All other systems reviewed and are negative.      Objective:   Physical Exam  General: Alert and oriented x 3, No apparent distress. obese  HEENT: Head is normocephalic, atraumatic, PERRLA, EOMI, sclera anicteric, oral mucosa pink and moist, dentition intact, ext ear canals clear,  Neck: Supple without JVD or lymphadenopathy  Heart: Reg rate and rhythm. No murmurs rubs or gallops  Chest: CTA bilaterally without wheezes, rales, or rhonchi; no distress  Abdomen: Soft, non-tender, non-distended, bowel sounds positive.  Extremities: No clubbing, cyanosis, or edema. Pulses are 2+  Skin: Clean and intact without signs of breakdown  Neuro: Pt is flat and has a blank look on her face . She does not recognize [name]. Remembered it was Mary [name] with cues. Didn't know why she was here.  Cranial nerves 2-12 are intact. Sensory exam is slightly decreased on the right. Reflexes are 3+ right and 2+ left. .  Still with wide based gait. Strength is stable at 4 to 5/5. She remains slightly decresaed on the right when compared to the left.  Musculoskeletal: has lumbar and cervical pain with flexion and extension today.  Psych: Pt's affect is flat as above  Assessment & Plan:   1. Non-specific  bicerebral/brainstem white matter disease---initially suspected to be MS, but not characteristic in presentation. Mary [name] in fact be a vasculitic process, perhaps an intracranial manifestation of SLE--Recent NOTCH3 mutation testing negative---less likely CADASIL. 2. Chronic pain syndrome related to above with primarily neuropathic right sided pain. She has had a recent  increase in cervical pain and related headaches since her hospitalization early last month.  3. Low back pain with ? Radiculopathy RLE  4. Chronic pancreatitis    Plan:  1. Continue topamax 100mg  am and 50mg  pm for headaches and neuropathic pain. Wrote for 60 per month to give some flexibility with dosing  2.Still pending psychiatry 3. Oxycodone was refilled. #120 10mg . Did not wish to resume tramadol given her current cognitive state. It was interesting that she could remember to ask about the tramadol but didn't remember who i was.  4. Needs 24 hour supervision now.  5. Continue with prn AFO for gait/support as possible.  6. robaxin 500mg  q6 prn #75, RF.  7. Rheumatology is most qualified in helping her proceed with an appropriate further work up. .  8. Follow up with [name] in about 2 months with [name] or NP. of face to face patient care time were spent during this visit. All questions were encouraged and answered.

## 2015-12-24 NOTE — Patient Instructions (Signed)
  PLEASE CALL ME WITH ANY PROBLEMS OR QUESTIONS (#[phone]).      

## 2015-12-26 ENCOUNTER — Telehealth: Payer: Self-pay | Admitting: Internal Medicine

## 2015-12-26 NOTE — Telephone Encounter (Signed)
I called the patient's HCPOA, [name], to inform her of the NOTCH3 testing results. I informed her the NOTCH3 testing showed no abnormalities, making the diagnosis of CADASIL far less likely. I told her if this were to be definitively ruled out, a skin biopsy required. [name] thanked me for the call and informed me that they would be following up with [facility] for any additional testing. She reported that [facility] memory had been improving, but many of her other motor symptoms and headaches have persisted.

## 2016-01-22 DIAGNOSIS — R2990 Unspecified symptoms and signs involving the nervous system: Secondary | ICD-10-CM | POA: Insufficient documentation

## 2016-01-22 DIAGNOSIS — R29898 Other symptoms and signs involving the musculoskeletal system: Secondary | ICD-10-CM | POA: Insufficient documentation

## 2016-02-11 IMAGING — US US ABDOMEN COMPLETE
1 series · 13 of 25 positions shown · non-contrast
Comparison: 07/30/2013 and prior CTs

CLINICAL DATA: 37-year-old female with abdominal pain. Acute
pancreatitis. History of cholecystectomy.

EXAM:
ULTRASOUND ABDOMEN COMPLETE

[Series 1: us abdomen complete · 0.24mm/px · 13 of 68 slices shown]
[im 1/68]
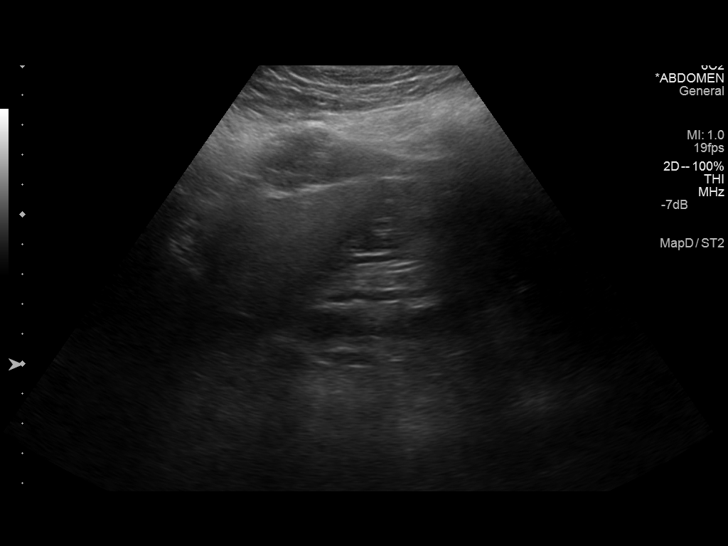
[im 6/68]
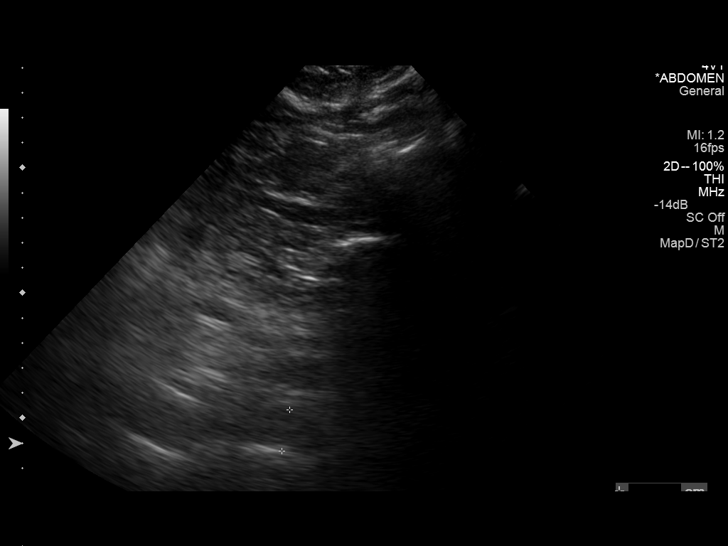
[im 12/68]
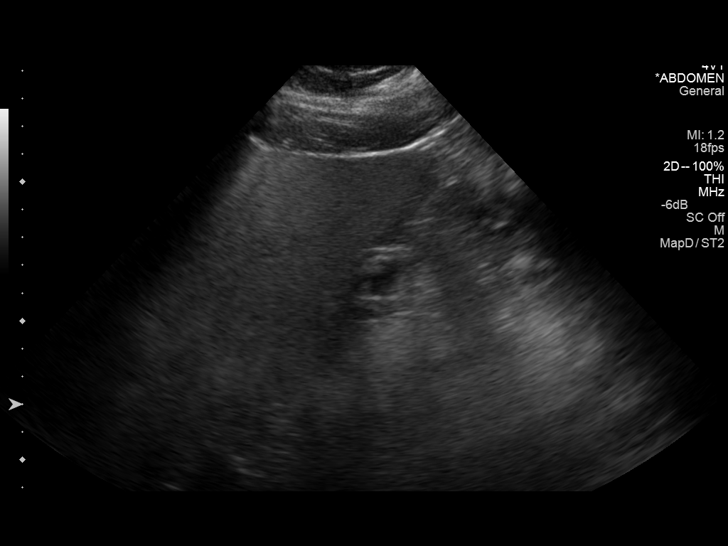
[im 17/68]
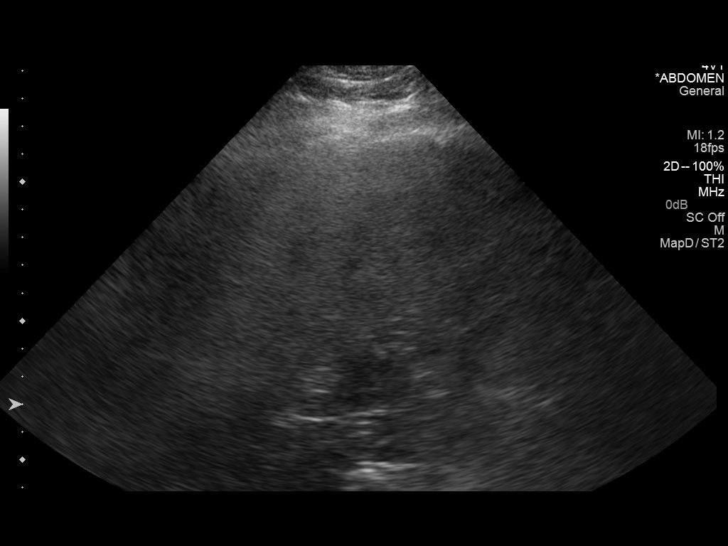
[im 23/68]
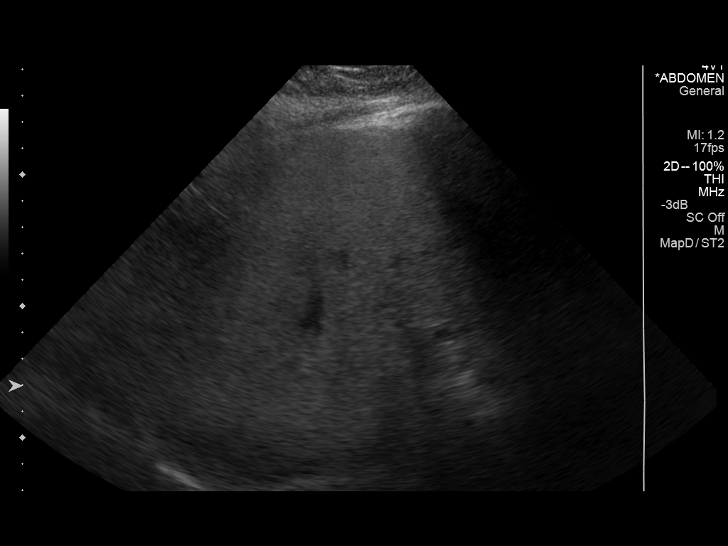
[im 28/68]
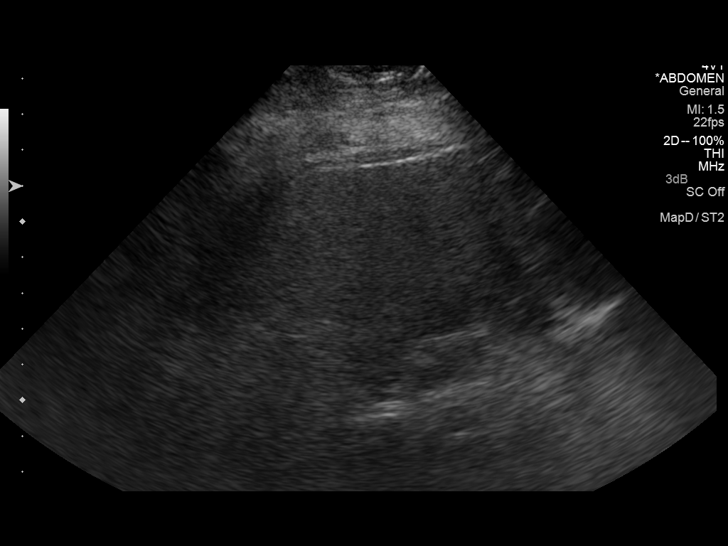
[im 34/68]
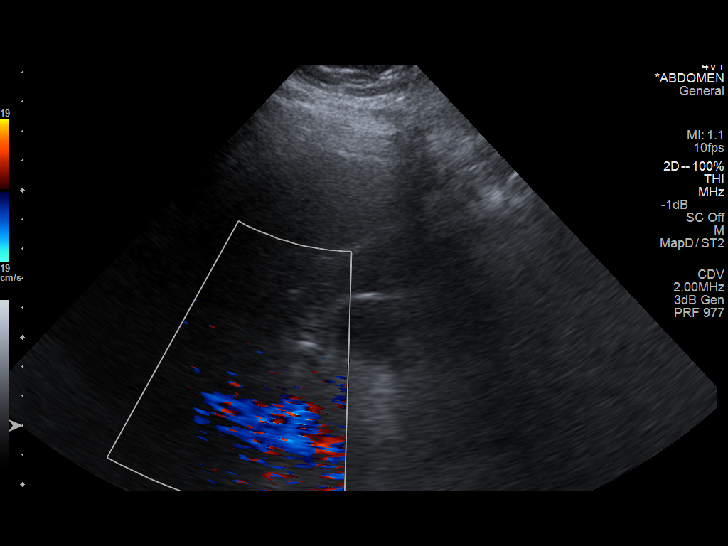
[im 40/68]
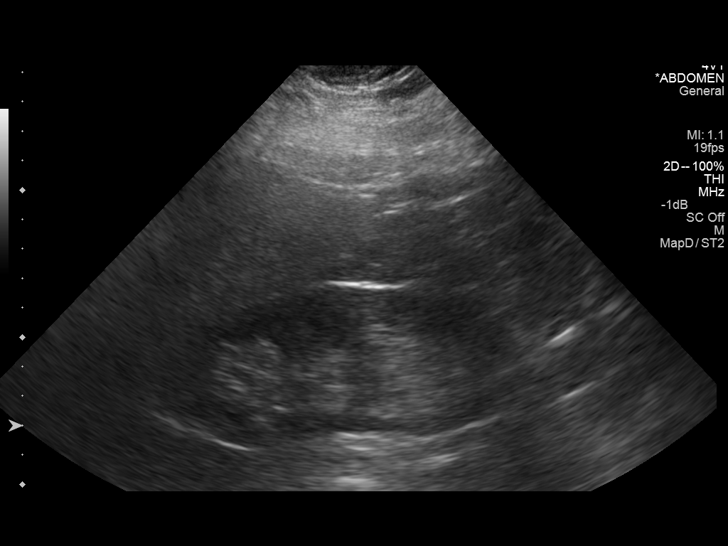
[im 45/68]
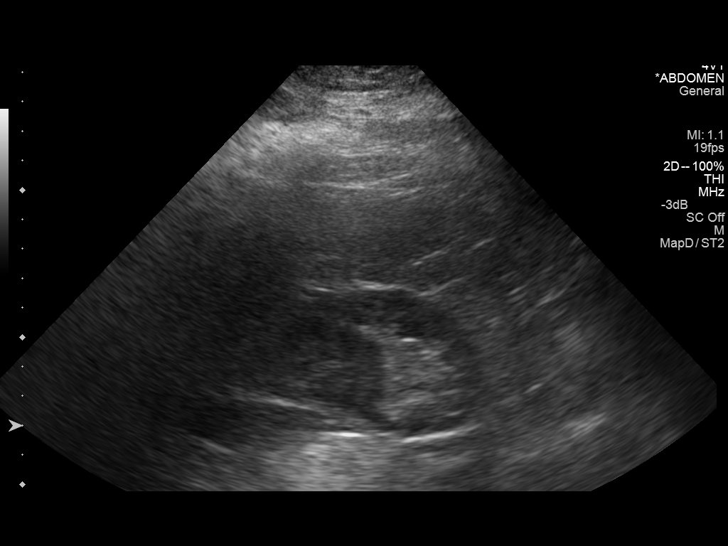
[im 51/68]
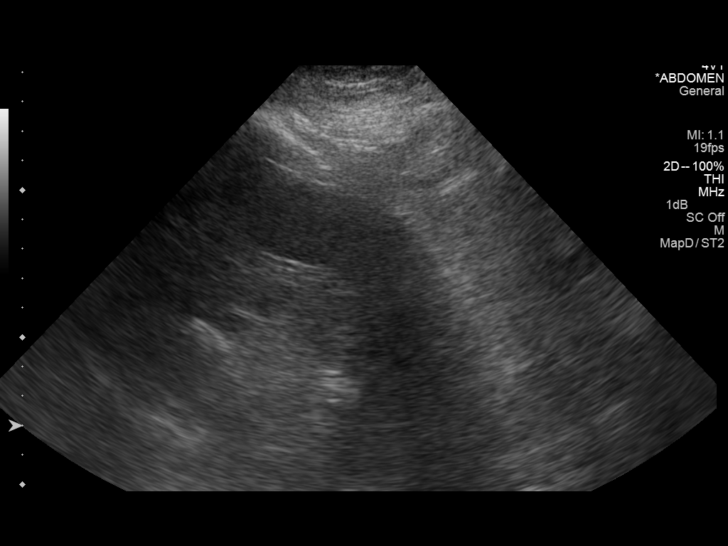
[im 56/68]
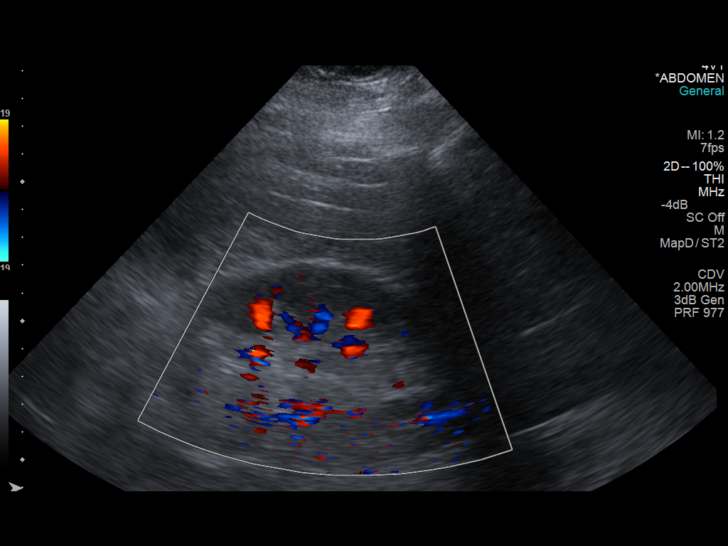
[im 62/68]
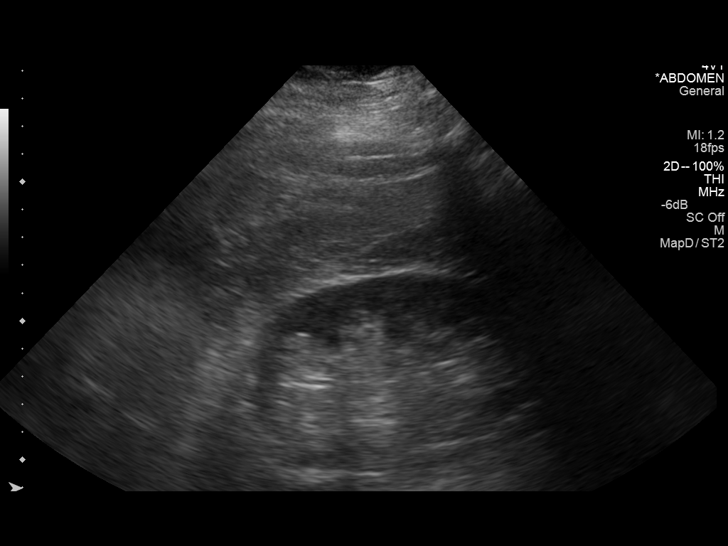
[im 68/68]
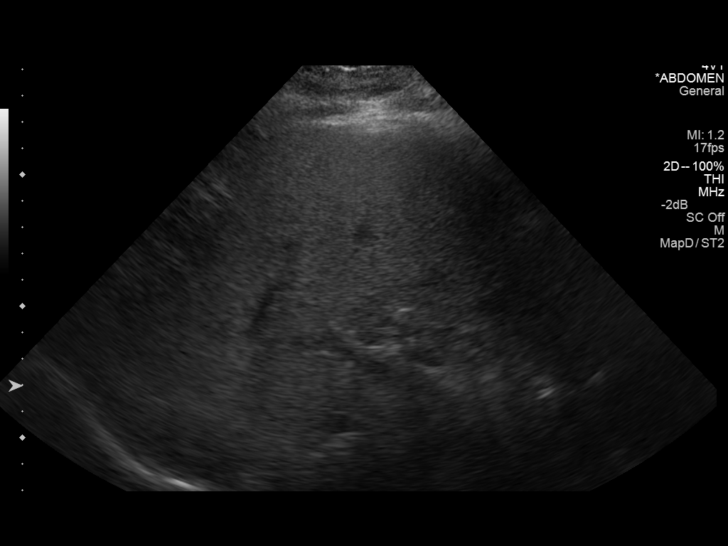

[13 of 25 positions shown; findings below may reference images not displayed]

FINDINGS: Gallbladder:

The gallbladder is not visualized compatible with cholecystectomy.

Common bile duct:

Diameter: 5.3 mm. There is no evidence of intrahepatic or
extrahepatic biliary dilatation.

Liver:

Diffuse increased echogenicity of the liver is compatible with fatty
infiltration. No focal hepatic abnormalities are noted.

IVC:

No abnormality visualized.

Pancreas:

The pancreas is not well visualized secondary to overlying bowel gas
and body habitus.

Spleen:

Size and appearance within normal limits.

Right Kidney:

Length: 12.8 cm. Echogenicity within normal limits. No mass or
hydronephrosis visualized.

Left Kidney:

Length: 0.6 cm. Echogenicity within normal limits. No mass or
hydronephrosis visualized.

Abdominal aorta:

No aneurysm identified but portions of the abdominal aorta are not
well visualized secondary to overlying bowel gas and body habitus.

Other findings:

None.
IMPRESSION: No evidence of acute abnormality.

Fatty infiltration of the liver.

Pancreas and portions of the abdominal aorta not well visualized
secondary to overlying bowel gas and body habitus.

## 2016-02-20 ENCOUNTER — Encounter: Payer: Self-pay | Admitting: Physical Medicine & Rehabilitation

## 2016-02-20 ENCOUNTER — Encounter: Payer: Medicaid Other | Attending: Physical Medicine & Rehabilitation | Admitting: Physical Medicine & Rehabilitation

## 2016-02-20 VITALS — BP 101/65 | HR 65

## 2016-02-20 DIAGNOSIS — F444 Conversion disorder with motor symptom or deficit: Secondary | ICD-10-CM

## 2016-02-20 DIAGNOSIS — Z5181 Encounter for therapeutic drug level monitoring: Secondary | ICD-10-CM | POA: Diagnosis not present

## 2016-02-20 DIAGNOSIS — G8191 Hemiplegia, unspecified affecting right dominant side: Secondary | ICD-10-CM | POA: Diagnosis not present

## 2016-02-20 DIAGNOSIS — Z79899 Other long term (current) drug therapy: Secondary | ICD-10-CM | POA: Diagnosis not present

## 2016-02-20 DIAGNOSIS — M545 Low back pain: Secondary | ICD-10-CM | POA: Diagnosis not present

## 2016-02-20 DIAGNOSIS — K861 Other chronic pancreatitis: Secondary | ICD-10-CM | POA: Insufficient documentation

## 2016-02-20 DIAGNOSIS — G35 Multiple sclerosis: Secondary | ICD-10-CM | POA: Diagnosis not present

## 2016-02-20 DIAGNOSIS — M5416 Radiculopathy, lumbar region: Secondary | ICD-10-CM

## 2016-02-20 DIAGNOSIS — M329 Systemic lupus erythematosus, unspecified: Secondary | ICD-10-CM

## 2016-02-20 DIAGNOSIS — G894 Chronic pain syndrome: Secondary | ICD-10-CM | POA: Diagnosis not present

## 2016-02-20 DIAGNOSIS — R531 Weakness: Secondary | ICD-10-CM

## 2016-02-20 DIAGNOSIS — M6289 Other specified disorders of muscle: Secondary | ICD-10-CM

## 2016-02-20 MED ORDER — OXYCODONE HCL 10 MG PO TABS: 10.0000 mg | ORAL_TABLET | Freq: Four times a day (QID) | ORAL | Status: DC | PRN

## 2016-02-20 MED ORDER — METHOCARBAMOL 500 MG PO TABS: 500.0000 mg | ORAL_TABLET | Freq: Four times a day (QID) | ORAL | Status: DC | PRN

## 2016-02-20 NOTE — Patient Instructions (Signed)
AS PART OF YOUR COUNSELING ASK TO WORK ON LEARNING SOME STRATEGIES TO DEAL WITH EMOTIONAL STRESS/CONFUSION AT HOME.    PLEASE CALL ME WITH ANY PROBLEMS OR QUESTIONS [phone])

## 2016-02-20 NOTE — Progress Notes (Signed)
Subjective:    Patient ID: Mary [name], female    DOB: 1976/08/10, 40 y.o.   MRN: [id number]  HPI  Mary [name] is here in follow up of her chronic pain and neurological condition. She is feeling better. Her memory has come back as well as her strength. She is getting some psychiatry care and sporadic psychology follow up at this point. She has her moments when she can become anxious and disoriented but these are less frequent. She was started on depakote by psychiatry but her mother was worried that it flared her pancreatitis (she developed abdominal pain) so it was stopped.   She was placed on imuran and it was deemed unhelpful, so it was stopped also. Her medications as a whole are unchanged at the moment.   She continues on oxycodone for pain control. Mary [name] doesn't feel that her mood has an impact on her pain levels. She does feel that the more active she is, the more she hurts. She has gained weight because of medicaitons also, such as prednisone.       Pain Inventory Average Pain 7 Pain Right Now 7 My pain is intermittent, sharp, burning, dull, stabbing, tingling and aching  In the last 24 hours, has pain interfered with the following? General activity 8 Relation with others 5 Enjoyment of life 0 What TIME of day is your pain at its worst? morning Sleep (in general) Poor  Pain is worse with: bending Pain improves with: medication Relief from Meds: 7  Mobility walk without assistance ability to climb steps?  yes do you drive?  no Do you have any goals in this area?  no  Function not employed: date last employed .  Neuro/Psych weakness confusion  Prior Studies Any changes since last visit?  no  Physicians involved in your care Any changes since last visit?  no   Family History  Problem Relation Age of Onset  . Lupus Neg Hx   . Sarcoidosis Neg Hx   . Pancreatitis Neg Hx   . Ataxia Neg Hx   . Chorea Neg Hx   . Dementia Neg Hx   . Mental retardation Neg Hx     . Migraines Neg Hx   . Multiple sclerosis Neg Hx   . Neurofibromatosis Neg Hx   . Neuropathy Neg Hx   . Parkinsonism Neg Hx   . Seizures Neg Hx   . Stroke Neg Hx   . Colitis Maternal Aunt   . Diabetes Mother   . Diabetes Father   . Diabetes      many family members   Social History   Social History  . Marital Status: Single    Spouse Name: N/A  . Number of Children: N/A  . Years of Education: N/A   Social History Main Topics  . Smoking status: Former Smoker -- 0.50 packs/day for 20 years    Types: Cigarettes    Quit date: 05/22/2013  . Smokeless tobacco: Never Used  . Alcohol Use: No  . Drug Use: No  . Sexual Activity: No   Other Topics Concern  . None   Social History Narrative   ** Merged History Encounter **       Lives permanently in [place] with mom and stepfather.  Has not started working yet and was unemployed in [place].            Past Surgical History  Procedure Laterality Date  . Abdominal surgery  ~ 2007    some sort  of pancreatic cyst drainage.   . Cholecystectomy      ~ age 36-laparoscopic  . Adenoidectomy    . 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 Eus N/A 04/14/2013    Procedure: UPPER ENDOSCOPIC ULTRASOUND (EUS) LINEAR;  Surgeon: Rachael Fee, MD;  Location: [facility];  Service: Endoscopy;  Laterality: N/A;  . Abdominal surgery    . Roux-en-y gastric bypass    . Cholecystectomy     Past Medical History  Diagnosis Date  . Hernia 03-24-13    ventral hernia remains   . Pancreatitis 03-24-13    2002, 2'2013, 03-14-13  . Pancreatic cyst 2002 onset  . Depression     "recent breakup with partner of 15 yrs"  . Adrenal insufficiency (HCC) 04/23/2013    ??  . MS (multiple sclerosis) (HCC)   . Ventral hernia   . GERD (gastroesophageal reflux disease)   . Depression   . Anxiety   . Abdominal hernia    BP 101/65 mmHg  Pulse 65  SpO2 97%  Opioid Risk Score:   Fall Risk Score:   `1  Depression screen PHQ 2/9  Depression screen [facility] 2/9 10/01/2015 05/21/2015 11/01/2014  Decreased Interest 2 2 2   Down, Depressed, Hopeless 2 - 2  PHQ - 2 Score 4 2 4   Altered sleeping - - 1  Tired, decreased energy - - 1  Change in appetite - - 3  Feeling bad or failure about yourself  - - 0  Trouble concentrating - - 1  Moving slowly or fidgety/restless - - 0  PHQ-9 Score - - 10     Review of Systems  Constitutional: Negative.   HENT: Negative.   Eyes: Negative.   Respiratory: Negative.   Cardiovascular: Negative.   Endocrine: Negative.   Genitourinary: Negative.   Musculoskeletal: Positive for back pain and neck pain.  Skin: Negative.   Allergic/Immunologic: Negative.   Neurological: Negative.   Hematological: Negative.   Psychiatric/Behavioral: Negative.        Objective:   Physical Exam General: Alert and oriented x 3, No apparent distress. obese  HEENT: Head is normocephalic, atraumatic, PERRLA, EOMI, sclera anicteric, oral mucosa pink and moist, dentition intact, ext ear canals clear,  Neck: Supple without JVD or lymphadenopathy  Heart: Reg rate and rhythm. No murmurs rubs or gallops  Chest: CTA bilaterally without wheezes, rales, or rhonchi; no distress  Abdomen: Soft, non-tender, non-distended, bowel sounds positive.  Extremities: No clubbing, cyanosis, or edema. Pulses are 2+  Skin: Clean and intact without signs of breakdown  Neuro: Pt is alert . She recognizes [name]. Knew the date. Knew why she was here. Cranial nerves 2-12 are intact. Sensory exam is slightly decreased on the right. Reflexes are 3+ right and 2+ left. . Normal gait today. No balance issues. Strength is stable at 4 to 4+ to 5/5 with slight weakness right as opposed to left.   Musculoskeletal: has lumbar and cervical pain with flexion and extension today.  Psych: Pt's affect is more dynamic    Assessment & Plan:   1. Non-specific bicerebral/brainstem white matter disease---initially  suspected to be MS, but not characteristic in presentation. May be a vasculitic process, perhaps an intracranial manifestation of SLE--Recent NOTCH3 mutation testing negative---less likely CADASIL.  2. Chronic pain syndrome related to above with primarily neuropathic right sided pain. She has had a recent increase in cervical pain and related headaches over the last  several months.  3. Low back pain with ? Radiculopathy RLE  4. Chronic pancreatitis    Plan:  1. Continue topamax  am and  pm for headaches and neuropathic pain. Giving her 60 per month to give some flexibility with dosing  2. Continue psychiatry follow up. I suspect that many of her neurological symptoms are non-organic in nature given her recent spontaneous improvement 3. Oxycodone was refilled. #120 . Fill later this month. 4. Discussed the potential impact of her mood on pain as well as her neurological symptoms. She needs to develop better coping skills as they pertain to her mood/cognition/stress etc. Will make a referral to assess her coping skills and to assist with improved strategies. I would like for him also to evaluate the potential of a conversion disorder also. In the meantime she will follow up with psychiatry. Apparently they had mentioned cognitive behavioral therapy to her.  .  6. robaxin  q6 prn #75, RF today.  7. Rheumatology follow up as directed. . .  8. Follow up with [name] in about 2 months with [name]. 30 minutes were spent during this visit. All questions were encouraged and answered.

## 2016-02-27 LAB — TOXASSURE SELECT,+ANTIDEPR,UR: PDF: 0

## 2016-02-28 ENCOUNTER — Telehealth: Payer: Self-pay

## 2016-02-28 NOTE — Progress Notes (Signed)
Urine drug screen for this encounter is consistent for prescribed medications.   

## 2016-02-28 NOTE — Telephone Encounter (Signed)
Error

## 2016-04-09 ENCOUNTER — Encounter: Payer: Medicaid Other | Attending: Physical Medicine & Rehabilitation | Admitting: Physical Medicine & Rehabilitation

## 2016-04-09 ENCOUNTER — Encounter: Payer: Self-pay | Admitting: Physical Medicine & Rehabilitation

## 2016-04-09 VITALS — BP 101/67 | HR 76 | Resp 16

## 2016-04-09 DIAGNOSIS — M329 Systemic lupus erythematosus, unspecified: Secondary | ICD-10-CM

## 2016-04-09 DIAGNOSIS — M545 Low back pain: Secondary | ICD-10-CM | POA: Diagnosis not present

## 2016-04-09 DIAGNOSIS — M6289 Other specified disorders of muscle: Secondary | ICD-10-CM

## 2016-04-09 DIAGNOSIS — G35D Multiple sclerosis, unspecified: Secondary | ICD-10-CM

## 2016-04-09 DIAGNOSIS — Z5181 Encounter for therapeutic drug level monitoring: Secondary | ICD-10-CM | POA: Diagnosis not present

## 2016-04-09 DIAGNOSIS — F447 Conversion disorder with mixed symptom presentation: Secondary | ICD-10-CM

## 2016-04-09 DIAGNOSIS — G35 Multiple sclerosis: Secondary | ICD-10-CM

## 2016-04-09 DIAGNOSIS — Z79899 Other long term (current) drug therapy: Secondary | ICD-10-CM

## 2016-04-09 DIAGNOSIS — K861 Other chronic pancreatitis: Secondary | ICD-10-CM | POA: Diagnosis not present

## 2016-04-09 DIAGNOSIS — F444 Conversion disorder with motor symptom or deficit: Secondary | ICD-10-CM | POA: Diagnosis not present

## 2016-04-09 DIAGNOSIS — G894 Chronic pain syndrome: Secondary | ICD-10-CM

## 2016-04-09 DIAGNOSIS — G35A Relapsing-remitting multiple sclerosis: Secondary | ICD-10-CM

## 2016-04-09 DIAGNOSIS — IMO0002 Reserved for concepts with insufficient information to code with codable children: Secondary | ICD-10-CM

## 2016-04-09 DIAGNOSIS — G8929 Other chronic pain: Secondary | ICD-10-CM

## 2016-04-09 DIAGNOSIS — R531 Weakness: Secondary | ICD-10-CM

## 2016-04-09 MED ORDER — OXYCODONE HCL 10 MG PO TABS: 10.0000 mg | ORAL_TABLET | Freq: Four times a day (QID) | ORAL | 0 refills | Status: DC | PRN

## 2016-04-09 NOTE — Patient Instructions (Signed)
Cervical Strain and Sprain With Rehab  Cervical strain and sprain are injuries that commonly occur with "whiplash" injuries. Whiplash occurs when the neck is forcefully whipped backward or forward, such as during a motor vehicle accident or during contact sports. The muscles, ligaments, tendons, discs, and nerves of the neck are susceptible to injury when this occurs.  RISK FACTORS  Risk of having a whiplash injury increases if:  · Osteoarthritis of the spine.  · Situations that make head or neck accidents or trauma more likely.  · High-risk sports (football, rugby, wrestling, hockey, auto racing, gymnastics, diving, contact karate, or boxing).  · Poor strength and flexibility of the neck.  · Previous neck injury.  · Poor tackling technique.  · Improperly fitted or padded equipment.  SYMPTOMS   · Pain or stiffness in the front or back of neck or both.  · Symptoms may present immediately or up to 24 hours after injury.  · Dizziness, headache, nausea, and vomiting.  · Muscle spasm with soreness and stiffness in the neck.  · Tenderness and swelling at the injury site.  PREVENTION  · Learn and use proper technique (avoid tackling with the head, spearing, and head-butting; use proper falling techniques to avoid landing on the head).  · Warm up and stretch properly before activity.  · Maintain physical fitness:    Strength, flexibility, and endurance.    Cardiovascular fitness.  · Wear properly fitted and padded protective equipment, such as padded soft collars, for participation in contact sports.  PROGNOSIS   Recovery from cervical strain and sprain injuries is dependent on the extent of the injury. These injuries are usually curable in 1 week to 3 months with appropriate treatment.   RELATED COMPLICATIONS   · Temporary numbness and weakness may occur if the nerve roots are damaged, and this may persist until the nerve has completely healed.  · Chronic pain due to frequent recurrence of symptoms.  · Prolonged healing,  especially if activity is resumed too soon (before complete recovery).  TREATMENT   Treatment initially involves the use of ice and medication to help reduce pain and inflammation. It is also important to perform strengthening and stretching exercises and modify activities that worsen symptoms so the injury does not get worse. These exercises may be performed at home or with a therapist. For patients who experience severe symptoms, a soft, padded collar may be recommended to be worn around the neck.   Improving your posture may help reduce symptoms. Posture improvement includes pulling your chin and abdomen in while sitting or standing. If you are sitting, sit in a firm chair with your buttocks against the back of the chair. While sleeping, try replacing your pillow with a small towel rolled to 2 inches in diameter, or use a cervical pillow or soft cervical collar. Poor sleeping positions delay healing.   For patients with nerve root damage, which causes numbness or weakness, the use of a cervical traction apparatus may be recommended. Surgery is rarely necessary for these injuries. However, cervical strain and sprains that are present at birth (congenital) may require surgery.  MEDICATION   · If pain medication is necessary, nonsteroidal anti-inflammatory medications, such as aspirin and ibuprofen, or other minor pain relievers, such as acetaminophen, are often recommended.  · Do not take pain medication for 7 days before surgery.  · Prescription pain relievers may be given if deemed necessary by your caregiver. Use only as directed and only as much as you need.    HEAT AND COLD:   · Cold treatment (icing) relieves pain and reduces inflammation. Cold treatment should be applied for 10 to 15 minutes every 2 to 3 hours for inflammation and pain and immediately after any activity that aggravates your symptoms. Use ice packs or an ice massage.  · Heat treatment may be used prior to performing the stretching and  strengthening activities prescribed by your caregiver, physical therapist, or athletic trainer. Use a heat pack or a warm soak.  SEEK MEDICAL CARE IF:   · Symptoms get worse or do not improve in 2 weeks despite treatment.  · New, unexplained symptoms develop (drugs used in treatment may produce side effects).  EXERCISES  RANGE OF MOTION (ROM) AND STRETCHING EXERCISES - Cervical Strain and Sprain  These exercises may help you when beginning to rehabilitate your injury. In order to successfully resolve your symptoms, you must improve your posture. These exercises are designed to help reduce the forward-head and rounded-shoulder posture which contributes to this condition. Your symptoms may resolve with or without further involvement from your physician, physical therapist or athletic trainer. While completing these exercises, remember:   · Restoring tissue flexibility helps normal motion to return to the joints. This allows healthier, less painful movement and activity.  · An effective stretch should be held for at least 20 seconds, although you may need to begin with shorter hold times for comfort.  · A stretch should never be painful. You should only feel a gentle lengthening or release in the stretched tissue.  STRETCH- Axial Extensors  · Lie on your back on the floor. You may bend your knees for comfort. Place a rolled-up hand towel or dish towel, about 2 inches in diameter, under the part of your head that makes contact with the floor.  · Gently tuck your chin, as if trying to make a "double chin," until you feel a gentle stretch at the base of your head.  · Hold __________ seconds.  Repeat __________ times. Complete this exercise __________ times per day.   STRETCH - Axial Extension   · Stand or sit on a firm surface. Assume a good posture: chest up, shoulders drawn back, abdominal muscles slightly tense, knees unlocked (if standing) and feet hip width apart.  · Slowly retract your chin so your head slides back  and your chin slightly lowers. Continue to look straight ahead.  · You should feel a gentle stretch in the back of your head. Be certain not to feel an aggressive stretch since this can cause headaches later.  · Hold for __________ seconds.  Repeat __________ times. Complete this exercise __________ times per day.  STRETCH - Cervical Side Bend   · Stand or sit on a firm surface. Assume a good posture: chest up, shoulders drawn back, abdominal muscles slightly tense, knees unlocked (if standing) and feet hip width apart.  · Without letting your nose or shoulders move, slowly tip your right / left ear to your shoulder until your feel a gentle stretch in the muscles on the opposite side of your neck.  · Hold __________ seconds.  Repeat __________ times. Complete this exercise __________ times per day.  STRETCH - Cervical Rotators   · Stand or sit on a firm surface. Assume a good posture: chest up, shoulders drawn back, abdominal muscles slightly tense, knees unlocked (if standing) and feet hip width apart.  · Keeping your eyes level with the ground, slowly turn your head until you feel a gentle stretch along   the back and opposite side of your neck.  · Hold __________ seconds.  Repeat __________ times. Complete this exercise __________ times per day.  RANGE OF MOTION - Neck Circles   · Stand or sit on a firm surface. Assume a good posture: chest up, shoulders drawn back, abdominal muscles slightly tense, knees unlocked (if standing) and feet hip width apart.  · Gently roll your head down and around from the back of one shoulder to the back of the other. The motion should never be forced or painful.  · Repeat the motion 10-20 times, or until you feel the neck muscles relax and loosen.  Repeat __________ times. Complete the exercise __________ times per day.  STRENGTHENING EXERCISES - Cervical Strain and Sprain  These exercises may help you when beginning to rehabilitate your injury. They may resolve your symptoms with or  without further involvement from your physician, physical therapist, or athletic trainer. While completing these exercises, remember:   · Muscles can gain both the endurance and the strength needed for everyday activities through controlled exercises.  · Complete these exercises as instructed by your physician, physical therapist, or athletic trainer. Progress the resistance and repetitions only as guided.  · You may experience muscle soreness or fatigue, but the pain or discomfort you are trying to eliminate should never worsen during these exercises. If this pain does worsen, stop and make certain you are following the directions exactly. If the pain is still present after adjustments, discontinue the exercise until you can discuss the trouble with your clinician.  STRENGTH - Cervical Flexors, Isometric  · Face a wall, standing about 6 inches away. Place a small pillow, a ball about 6-8 inches in diameter, or a folded towel between your forehead and the wall.  · Slightly tuck your chin and gently push your forehead into the soft object. Push only with mild to moderate intensity, building up tension gradually. Keep your jaw and forehead relaxed.  · Hold 10 to 20 seconds. Keep your breathing relaxed.  · Release the tension slowly. Relax your neck muscles completely before you start the next repetition.  Repeat __________ times. Complete this exercise __________ times per day.  STRENGTH- Cervical Lateral Flexors, Isometric   · Stand about 6 inches away from a wall. Place a small pillow, a ball about 6-8 inches in diameter, or a folded towel between the side of your head and the wall.  · Slightly tuck your chin and gently tilt your head into the soft object. Push only with mild to moderate intensity, building up tension gradually. Keep your jaw and forehead relaxed.  · Hold 10 to 20 seconds. Keep your breathing relaxed.  · Release the tension slowly. Relax your neck muscles completely before you start the next  repetition.  Repeat __________ times. Complete this exercise __________ times per day.  STRENGTH - Cervical Extensors, Isometric   · Stand about 6 inches away from a wall. Place a small pillow, a ball about 6-8 inches in diameter, or a folded towel between the back of your head and the wall.  · Slightly tuck your chin and gently tilt your head back into the soft object. Push only with mild to moderate intensity, building up tension gradually. Keep your jaw and forehead relaxed.  · Hold 10 to 20 seconds. Keep your breathing relaxed.  · Release the tension slowly. Relax your neck muscles completely before you start the next repetition.  Repeat __________ times. Complete this exercise __________ times per day.    POSTURE AND BODY MECHANICS CONSIDERATIONS - Cervical Strain and Sprain  Keeping correct posture when sitting, standing or completing your activities will reduce the stress put on different body tissues, allowing injured tissues a chance to heal and limiting painful experiences. The following are general guidelines for improved posture. Your physician or physical therapist will provide you with any instructions specific to your needs. While reading these guidelines, remember:  · The exercises prescribed by your provider will help you have the flexibility and strength to maintain correct postures.  · The correct posture provides the optimal environment for your joints to work. All of your joints have less wear and tear when properly supported by a spine with good posture. This means you will experience a healthier, less painful body.  · Correct posture must be practiced with all of your activities, especially prolonged sitting and standing. Correct posture is as important when doing repetitive low-stress activities (typing) as it is when doing a single heavy-load activity (lifting).  PROLONGED STANDING WHILE SLIGHTLY LEANING FORWARD  When completing a task that requires you to lean forward while standing in one  place for a long time, place either foot up on a stationary 2- to 4-inch high object to help maintain the best posture. When both feet are on the ground, the low back tends to lose its slight inward curve. If this curve flattens (or becomes too large), then the back and your other joints will experience too much stress, fatigue more quickly, and can cause pain.   RESTING POSITIONS  Consider which positions are most painful for you when choosing a resting position. If you have pain with flexion-based activities (sitting, bending, stooping, squatting), choose a position that allows you to rest in a less flexed posture. You would want to avoid curling into a fetal position on your side. If your pain worsens with extension-based activities (prolonged standing, working overhead), avoid resting in an extended position such as sleeping on your stomach. Most people will find more comfort when they rest with their spine in a more neutral position, neither too rounded nor too arched. Lying on a non-sagging bed on your side with a pillow between your knees, or on your back with a pillow under your knees will often provide some relief. Keep in mind, being in any one position for a prolonged period of time, no matter how correct your posture, can still lead to stiffness.  WALKING  Walk with an upright posture. Your ears, shoulders, and hips should all line up.  OFFICE WORK  When working at a desk, create an environment that supports good, upright posture. Without extra support, muscles fatigue and lead to excessive strain on joints and other tissues.  CHAIR:  · A chair should be able to slide under your desk when your back makes contact with the back of the chair. This allows you to work closely.  · The chair's height should allow your eyes to be level with the upper part of your monitor and your hands to be slightly lower than your elbows.  · Body position:    Your feet should make contact with the floor. If this is not  possible, use a foot rest.    Keep your ears over your shoulders. This will reduce stress on your neck and low back.     This information is not intended to replace advice given to you by your health care provider. Make sure you discuss any questions you have with your health care provider.       Document Released: 08/04/2005 Document Revised: 08/25/2014 Document Reviewed: 11/16/2008  Elsevier Interactive Patient Education ©2016 [employer]

## 2016-04-09 NOTE — Progress Notes (Signed)
Subjective:    Patient ID: Mary [name], female    DOB: Dec 24, 1975, 40 y.o.   MRN: [id number]  HPI   Lyliana is here in follow up of her cognitive and behavioral issues. She is being treated by a rheumatologist at [facility] in [facility] who has definitely diagnosed her with SLE. They are working with her on treatment options. She has had another "regression" after a recent family situation. She is having problems with memory and concentration. Physically sh'es feeling bette.r   She is having more headaches which originate in her mid neck and radiate to her occiput. They tend to be more severe when she's active. Ice helps. Sometimes they wake her up at night. Low back is bothering her somewhat too.   She has a psychiatrist who is working on her mood/emotional lability. She doesn't have a psychologist yet.    Pain Inventory Average Pain 8 Pain Right Now 6 My pain is sharp, burning, stabbing and tingling  In the last 24 hours, has pain interfered with the following? General activity 7 Relation with others 4 Enjoyment of life 5 What TIME of day is your pain at its worst? evening Sleep (in general) Poor  Pain is worse with: bending Pain improves with: rest and medication Relief from Meds: 8  Mobility walk without assistance do you drive?  no  Function I need assistance with the following:  shopping  Neuro/Psych weakness numbness tingling spasms confusion depression anxiety  Prior Studies Any changes since last visit?  no  Physicians involved in your care Any changes since last visit?  no   Family History  Problem Relation Age of Onset  . Lupus Neg Hx   . Sarcoidosis Neg Hx   . Pancreatitis Neg Hx   . Ataxia Neg Hx   . Chorea Neg Hx   . Dementia Neg Hx   . Mental retardation Neg Hx   . Migraines Neg Hx   . Multiple sclerosis Neg Hx   . Neurofibromatosis Neg Hx   . Neuropathy Neg Hx   . Parkinsonism Neg Hx   . Seizures Neg Hx   . Stroke Neg Hx   . Colitis Maternal  Aunt   . Diabetes Mother   . Diabetes Father   . Diabetes      many family members   Social History   Social History  . Marital status: Single    Spouse name: N/A  . Number of children: N/A  . Years of education: N/A   Social History Main Topics  . Smoking status: Former Smoker    Packs/day: 0.50    Years: 20.00    Types: Cigarettes    Quit date: 05/22/2013  . Smokeless tobacco: Never Used  . Alcohol use No  . Drug use: No  . Sexual activity: No   Other Topics Concern  . None   Social History Narrative   ** Merged History Encounter **       Lives permanently in [place] with mom and stepfather.  Has not started working yet and was unemployed in [place].            Past Surgical History:  Procedure Laterality Date  . ABDOMINAL SURGERY  ~ 2007   some sort of pancreatic cyst drainage.   . ABDOMINAL SURGERY    . ADENOIDECTOMY    . CHOLECYSTECTOMY     ~ age 21-laparoscopic  . CHOLECYSTECTOMY    . EUS N/A 04/14/2013   Procedure: UPPER ENDOSCOPIC ULTRASOUND (EUS)  LINEAR;  Surgeon: Rachael Feeaniel P Jacobs, MD;  Location: [facility];  Service: Endoscopy;  Laterality: N/A;  . ROUX-EN-Y GASTRIC BYPASS    . ROUX-EN-Y PROCEDURE     stent to pancreatic cyst that became infected within 36 hours   Past Medical History:  Diagnosis Date  . Abdominal hernia   . Adrenal insufficiency (HCC) 04/23/2013   ??  Marland Kitchen. Anxiety   . Depression    "recent breakup with partner of 15 yrs"  . Depression   . GERD (gastroesophageal reflux disease)   . Hernia 03-24-13   ventral hernia remains   . MS (multiple sclerosis) (HCC)   . Pancreatic cyst 2002 onset  . Pancreatitis 03-24-13   2002, 2'2013, 03-14-13  . Ventral hernia    BP 101/67 (BP Location: Right Arm, Patient Position: Sitting, Cuff Size: Large)   Pulse 76   Resp 16   SpO2 96%   Opioid Risk Score:   Fall Risk Score:  `1  Depression screen PHQ 2/9  [facility] 04/09/2016 10/01/2015 05/21/2015 11/01/2014  Decreased Interest 2 2  2 2   Down, Depressed, Hopeless 2 2 - 2  PHQ - 2 Score 4 4 2 4   Altered sleeping - - - 1  Tired, decreased energy - - - 1  Change in appetite - - - 3  Feeling bad or failure about yourself  - - - 0  Trouble concentrating - - - 1  Moving slowly or fidgety/restless - - - 0  PHQ-9 Score - - - 10   Review of Systems  Cardiovascular: Positive for leg swelling.  Gastrointestinal: Positive for abdominal pain and nausea.  All other systems reviewed and are negative.      Objective:   Physical Exam  General: Alert and oriented x 3, No apparent distress. obese  HEENT: Head is normocephalic, atraumatic, PERRLA, EOMI, sclera anicteric, oral mucosa pink and moist, dentition intact, ext ear canals clear,  Neck: Supple without JVD or lymphadenopathy  Heart: Reg rate and rhythm. No murmurs rubs or gallops  Chest: CTA bilaterally without wheezes, rales, or rhonchi; no distress  Abdomen: Soft, non-tender, non-distended, bowel sounds positive.  Extremities: No clubbing, cyanosis, or edema. Pulses are 2+  Skin: Clean and intact without signs of breakdown  Neuro: Pt is alert . She recognizes [name]. Knew the date. Knew why she was here. Cranial nerves 2-12 are intact. Sensory exam is slightly decreased on the right. Reflexes are 3+ right and 2+ left. . Normal gait today. No balance issues. Strength is stable at 4 to 4+ to 5/5 with slight weakness right as opposed to left.   Musculoskeletal: has lumbar and cervical pain with flexion and extension today.  Psych: Pt's affect is more dynamic    Assessment & Plan:   1. Non-specific bicerebral/brainstem white matter disease---initially suspected to be MS, but not characteristic in presentation. Likely SLE.   2. Chronic pain syndrome related to above with primarily neuropathic right sided pain. She has had a recent increase in cervical pain and related headaches over the last several months as well.  3. Low back pain with ? Radiculopathy RLE  4. Chronic  pancreatitis  5. Conversion disorder   Plan:  1. Continue topamax 100mg  am and 50mg  pm for headaches and neuropathic pain. Giving her 60 per month to give some flexibility with dosing  2. Continue psychiatry follow up. Many of her neurological symptoms are likely non-organic in nature given her recent spontaneous improvement and relapse. Pt/family aware  -  needs psychology follow up too 3. Oxycodone was refilled. #120 10mg . Fill later this month. 4. Provided cervical exercises to address strength and ROM. Discussed importance of posture. 6. TENS unit order made.  7. Rheumatology follow up as directed. . .  8. Follow up with [name] in about 2 months with [name]. 30 minutes were spent during this visit. All questions were encouraged and answered.

## 2016-04-16 ENCOUNTER — Other Ambulatory Visit: Payer: Self-pay | Admitting: Gastroenterology

## 2016-05-01 ENCOUNTER — Telehealth: Payer: Self-pay | Admitting: *Deleted

## 2016-05-01 NOTE — Telephone Encounter (Signed)
Prior authorization sent to [facility] for oxycodone 10 mg

## 2016-05-19 ENCOUNTER — Encounter: Payer: Self-pay | Admitting: Physical Medicine & Rehabilitation

## 2016-05-20 ENCOUNTER — Telehealth: Payer: Self-pay | Admitting: *Deleted

## 2016-05-20 DIAGNOSIS — R531 Weakness: Secondary | ICD-10-CM

## 2016-05-20 DIAGNOSIS — Z79899 Other long term (current) drug therapy: Secondary | ICD-10-CM

## 2016-05-20 DIAGNOSIS — G35 Multiple sclerosis: Secondary | ICD-10-CM

## 2016-05-20 DIAGNOSIS — Z5181 Encounter for therapeutic drug level monitoring: Secondary | ICD-10-CM

## 2016-05-20 DIAGNOSIS — G894 Chronic pain syndrome: Secondary | ICD-10-CM

## 2016-05-20 DIAGNOSIS — M329 Systemic lupus erythematosus, unspecified: Secondary | ICD-10-CM

## 2016-05-20 MED ORDER — TOPIRAMATE 100 MG PO TABS: ORAL_TABLET | ORAL | 4 refills | Status: DC

## 2016-05-20 NOTE — Telephone Encounter (Signed)
Refilled per mychart request the topamax

## 2016-06-04 ENCOUNTER — Encounter: Payer: Self-pay | Admitting: Physical Medicine & Rehabilitation

## 2016-06-04 ENCOUNTER — Encounter: Payer: Medicaid Other | Attending: Physical Medicine & Rehabilitation | Admitting: Physical Medicine & Rehabilitation

## 2016-06-04 DIAGNOSIS — G894 Chronic pain syndrome: Secondary | ICD-10-CM | POA: Diagnosis present

## 2016-06-04 DIAGNOSIS — Z5181 Encounter for therapeutic drug level monitoring: Secondary | ICD-10-CM

## 2016-06-04 DIAGNOSIS — M545 Low back pain: Secondary | ICD-10-CM | POA: Insufficient documentation

## 2016-06-04 DIAGNOSIS — K861 Other chronic pancreatitis: Secondary | ICD-10-CM | POA: Insufficient documentation

## 2016-06-04 DIAGNOSIS — G35 Multiple sclerosis: Secondary | ICD-10-CM | POA: Insufficient documentation

## 2016-06-04 DIAGNOSIS — Z79899 Other long term (current) drug therapy: Secondary | ICD-10-CM

## 2016-06-04 DIAGNOSIS — M329 Systemic lupus erythematosus, unspecified: Secondary | ICD-10-CM

## 2016-06-04 DIAGNOSIS — R531 Weakness: Secondary | ICD-10-CM

## 2016-06-04 MED ORDER — OXYCODONE HCL 10 MG PO TABS: 10.0000 mg | ORAL_TABLET | Freq: Four times a day (QID) | ORAL | 0 refills | Status: DC | PRN

## 2016-06-04 MED ORDER — CLONAZEPAM 0.5 MG PO TABS: 1.0000 mg | ORAL_TABLET | Freq: Two times a day (BID) | ORAL | 0 refills | Status: DC

## 2016-06-04 MED ORDER — METHOCARBAMOL 500 MG PO TABS: 500.0000 mg | ORAL_TABLET | Freq: Four times a day (QID) | ORAL | 3 refills | Status: DC | PRN

## 2016-06-04 NOTE — Patient Instructions (Signed)
SLEEP HYGIENE  GO TO BED BEFORE MIDNIGHT!!!  AVOID NAPS DURING THE DAY  GOAL OF 8-10 HOURS PER NIGHT OF SLEEP

## 2016-06-04 NOTE — Progress Notes (Signed)
Subjective:    Patient ID: Mary Mary [name], female    DOB: 1975-09-17, 40 y.o.   MRN: [id number]  HPI   Mary Mary [name] is here in follow up of her chronic pain and cognitive deficits. She has been about the same in regard to her pain. Her mother reports that she has had problems with hearing and has bought hearing aids which has helped. Nobody has analyzed the hearing loss.   Her appointment with neuropsychology with cancelled---she sees Dr. Gennie [name] on 10/31.  He pancreas has been acting up as well and she's developing pain after she eats meals. She is drinking a lof protein shakes which she seems to tolerate better  Oxycodone remains effective for her pain .   Pain Inventory Average Pain 8 Pain Right Now 7 My pain is sharp, burning, stabbing and aching  In the last 24 hours, has pain interfered with the following? General activity 9 Relation with others 7 Enjoyment of life 8 What TIME of day is your pain at its worst? Morning, night Sleep (in general) Poor  Pain is worse with: bending, standing and some activites Pain improves with: rest and medication Relief from Meds: 5  Mobility walk without assistance ability to climb steps?  yes do you drive?  no transfers alone Do you have any goals in this area?  yes  Function disabled: date disabled 2014  Neuro/Psych weakness numbness tingling spasms confusion depression anxiety  Prior Studies Any changes since last visit?  no  Physicians involved in your care Any changes since last visit?  no   Family History  Problem Relation Age of Onset  . Lupus Neg Hx   . Sarcoidosis Neg Hx   . Pancreatitis Neg Hx   . Ataxia Neg Hx   . Chorea Neg Hx   . Dementia Neg Hx   . Mental retardation Neg Hx   . Migraines Neg Hx   . Multiple sclerosis Neg Hx   . Neurofibromatosis Neg Hx   . Neuropathy Neg Hx   . Parkinsonism Neg Hx   . Seizures Neg Hx   . Stroke Neg Hx   . Colitis Maternal Aunt   . Diabetes Mother   . Diabetes  Father   . Diabetes      many family members   Social History   Social History  . Marital status: Single    Spouse name: N/A  . Number of children: N/A  . Years of education: N/A   Social History Main Topics  . Smoking status: Former Smoker    Packs/day: 0.50    Years: 20.00    Types: Cigarettes    Quit date: 05/22/2013  . Smokeless tobacco: Never Used  . Alcohol use No  . Drug use: No  . Sexual activity: No   Other Topics Concern  . None   Social History Narrative   ** Merged History Encounter **       Lives permanently in [place] with mom and stepfather.  Has not started working yet and was unemployed in [place].            Past Surgical History:  Procedure Laterality Date  . ABDOMINAL SURGERY  ~ 2007   some sort of pancreatic cyst drainage.   . ABDOMINAL SURGERY    . ADENOIDECTOMY    . CHOLECYSTECTOMY     ~ age 13-laparoscopic  . CHOLECYSTECTOMY    . EUS N/A 04/14/2013   Procedure: UPPER ENDOSCOPIC ULTRASOUND (EUS) LINEAR;  Surgeon: Melton Alar  Christella Hartigan, MD;  Location: [facility];  Service: Endoscopy;  Laterality: N/A;  . ROUX-EN-Y GASTRIC BYPASS    . ROUX-EN-Y PROCEDURE     stent to pancreatic cyst that became infected within 36 hours   Past Medical History:  Diagnosis Date  . Abdominal hernia   . Adrenal insufficiency (HCC) 04/23/2013   ??  Marland Kitchen Anxiety   . Depression    "recent breakup with partner of 15 yrs"  . Depression   . GERD (gastroesophageal reflux disease)   . Hernia 03-24-13   ventral hernia remains   . MS (multiple sclerosis) (HCC)   . Pancreatic cyst 2002 onset  . Pancreatitis 03-24-13   2002, 2'2013, 03-14-13  . Ventral hernia    BP 94/63   Pulse 64   Resp 14   SpO2 96%   Opioid Risk Score:   Fall Risk Score:  `1  Depression screen PHQ 2/9  Depression screen [facility] 2/9 04/09/2016 10/01/2015 05/21/2015 11/01/2014  Decreased Interest 2 2 2 2   Down, Depressed, Hopeless 2 2 - 2  PHQ - 2 Score 4 4 2 4   Altered sleeping - - - 1  Tired, decreased  energy - - - 1  Change in appetite - - - 3  Feeling bad or failure about yourself  - - - 0  Trouble concentrating - - - 1  Moving slowly or fidgety/restless - - - 0  PHQ-9 Score - - - 10    Review of Systems  Constitutional: Positive for appetite change and unexpected weight change.  Gastrointestinal: Positive for abdominal pain, nausea and vomiting.  All other systems reviewed and are negative.      Objective:   Physical Exam   General: Alert and oriented x 3, No apparent distress. obese  HEENT:Head is normocephalic, atraumatic, PERRLA, EOMI, sclera anicteric, oral mucosa pink and moist, dentition intact, ext ear canals clear,  Neck:Supple without JVD or lymphadenopathy  Heart:Reg rate and rhythm. No murmurs rubs or gallops  Chest:CTA bilaterally without wheezes, rales, or rhonchi; no distress  Abdomen:Soft, non-tender, non-distended, bowel sounds positive.  Extremities:No clubbing, cyanosis, or edema. Pulses are 2+  Skin:Clean and intact without signs of breakdown  Neuro:Pt is alert . She recognizes Mary [name]. Knew the date. Knew why she was here. Cranial nerves 2-12 are intact. Sensory exam is slightly decreased on the right. Reflexes are 3+ rightnd 2+ left. . Normal gait today. No balance issues. Strength is stable at 4 to 4+ to 5/5 with slight weakness right as opposed to left.   Musculoskeletal: has lumbar and cervical pain with flexion and extension today.  Psych:Pt's affect is more dynamic although she still is in somewhat of a "fog"   Assessment & Plan:  1. Non-specific bicerebral/brainstem white matter disease---initially suspected to be MS, but not characteristic in presentation. Likely SLE.   2. Chronic pain syndrome related to above with primarily neuropathic right sided pain. She has had a recent increase in cervical pain and related headaches over the last several months as well.  3. Low back pain with ? Radiculopathy RLE  4. Chronic pancreatitis  5.  Conversion disorder   Plan:  1. Continue topamax 100mg  am and 50mg  pm for headaches and neuropathic pain. Giving her 60 per month to give some flexibility with dosing  2. Continue psychiatry follow up. Many of her neurological symptoms are likely non-organic in nature given her recent spontaneous improvement and relapse. Pt/family aware             -  needs psychology follow up too  -needs bettersleep hygiene. Discussed regular sleep hours/bedtime before midnight 3. Oxycodone was refilled. #120 10mg . Fill later this month. 4.  TENS unit order made.  7. Rheumatology follow up as directed.  8. Follow up with Mary [name] in about 2 months with Mary [name]. 30 minutes were spent during this visit. All questions were encouraged and answered.

## 2016-06-09 ENCOUNTER — Encounter: Payer: Self-pay | Admitting: Gastroenterology

## 2016-06-09 ENCOUNTER — Ambulatory Visit (INDEPENDENT_AMBULATORY_CARE_PROVIDER_SITE_OTHER): Payer: Medicaid Other | Admitting: Gastroenterology

## 2016-06-09 ENCOUNTER — Other Ambulatory Visit (INDEPENDENT_AMBULATORY_CARE_PROVIDER_SITE_OTHER): Payer: Medicaid Other

## 2016-06-09 VITALS — BP 110/70 | HR 82 | Ht 63.0 in | Wt 236.0 lb

## 2016-06-09 DIAGNOSIS — Z8719 Personal history of other diseases of the digestive system: Secondary | ICD-10-CM | POA: Diagnosis not present

## 2016-06-09 DIAGNOSIS — K2100 Gastro-esophageal reflux disease with esophagitis, without bleeding: Secondary | ICD-10-CM

## 2016-06-09 DIAGNOSIS — R1013 Epigastric pain: Secondary | ICD-10-CM

## 2016-06-09 DIAGNOSIS — K625 Hemorrhage of anus and rectum: Secondary | ICD-10-CM | POA: Insufficient documentation

## 2016-06-09 DIAGNOSIS — R112 Nausea with vomiting, unspecified: Secondary | ICD-10-CM

## 2016-06-09 LAB — COMPREHENSIVE METABOLIC PANEL
ALBUMIN: 4.4 g/dL (ref 3.5–5.2)
ALK PHOS: 86 U/L (ref 39–117)
ALT: 12 U/L (ref 0–35)
AST: 12 U/L (ref 0–37)
BILIRUBIN TOTAL: 0.2 mg/dL (ref 0.2–1.2)
BUN: 16 mg/dL (ref 6–23)
CO2: 21 mEq/L (ref 19–32)
Calcium: 9.4 mg/dL (ref 8.4–10.5)
Chloride: 109 mEq/L (ref 96–112)
Creatinine, Ser: 0.68 mg/dL (ref 0.40–1.20)
GFR: 102.42 mL/min (ref >60.00)
GLUCOSE: 96 mg/dL (ref 70–99)
POTASSIUM: 3.6 meq/L (ref 3.5–5.1)
SODIUM: 139 meq/L (ref 135–145)
TOTAL PROTEIN: 8 g/dL (ref 6.0–8.3)

## 2016-06-09 LAB — CBC WITH DIFFERENTIAL/PLATELET
BASOS ABS: 0 10*3/uL (ref 0.0–0.1)
Basophils Relative: 0.4 % (ref 0.0–3.0)
EOS PCT: 2.5 % (ref 0.0–5.0)
Eosinophils Absolute: 0.2 10*3/uL (ref 0.0–0.7)
HCT: 37 % (ref 36.0–46.0)
HEMOGLOBIN: 12.4 g/dL (ref 12.0–15.0)
Lymphocytes Relative: 14.7 % (ref 12.0–46.0)
Lymphs Abs: 1.3 10*3/uL (ref 0.7–4.0)
MCHC: 33.6 g/dL (ref 30.0–36.0)
MCV: 86.1 fl (ref 78.0–100.0)
MONO ABS: 0.8 10*3/uL (ref 0.1–1.0)
MONOS PCT: 9.6 % (ref 3.0–12.0)
Neutro Abs: 6.2 10*3/uL (ref 1.4–7.7)
Neutrophils Relative %: 72.8 % (ref 43.0–77.0)
Platelets: 289 10*3/uL (ref 150.0–400.0)
RBC: 4.3 Mil/uL (ref 3.87–5.11)
RDW: 15.1 % (ref 11.5–15.5)
WBC: 8.6 10*3/uL (ref 4.0–10.5)

## 2016-06-09 LAB — LIPASE: LIPASE: 2 U/L — AB (ref 11.0–59.0)

## 2016-06-09 LAB — VITAMIN B12: Vitamin B-12: 929 pg/mL — ABNORMAL HIGH (ref 211–911)

## 2016-06-09 MED ORDER — PROMETHAZINE HCL 25 MG PO TABS: ORAL_TABLET | ORAL | 3 refills | Status: DC

## 2016-06-09 MED ORDER — HYDROCORTISONE ACETATE 25 MG RE SUPP: 25.0000 mg | Freq: Every day | RECTAL | 1 refills | Status: DC

## 2016-06-09 MED ORDER — PANTOPRAZOLE SODIUM 40 MG PO TBEC: 40.0000 mg | DELAYED_RELEASE_TABLET | Freq: Two times a day (BID) | ORAL | 3 refills | Status: DC

## 2016-06-09 NOTE — Progress Notes (Signed)
Error

## 2016-06-09 NOTE — Patient Instructions (Addendum)
You have been scheduled for a CT scan of the abdomen and pelvis at [facility] ([address]---this is in the same building as [facility]).   You are scheduled on 06/13/16 at 2 pm. You should arrive 15 minutes prior to your appointment time for registration. Please follow the written instructions below on the day of your exam:  WARNING: IF YOU ARE ALLERGIC TO IODINE/X-RAY DYE, PLEASE NOTIFY RADIOLOGY IMMEDIATELY AT [phone]! YOU WILL BE GIVEN A 13 HOUR PREMEDICATION PREP.  1) Do not eat or drink anything after 10 am (4 hours prior to your test) 2) You have been given 2 bottles of oral contrast to drink. The solution may taste better if refrigerated, but do NOT add ice or any other liquid to this solution. Shake well before drinking.    Drink 1 bottle of contrast @ 12 noon (2 hours prior to your exam)  Drink 1 bottle of contrast @ 1 pm (1 hour prior to your exam)  You may take any medications as prescribed with a small amount of water except for the following: Metformin, Glucophage, Glucovance, Avandamet, Riomet, Fortamet, Actoplus Met, Janumet, Glumetza or Metaglip. The above medications must be held the day of the exam AND 48 hours after the exam.  The purpose of you drinking the oral contrast is to aid in the visualization of your intestinal tract. The contrast solution may cause some diarrhea. Before your exam is started, you will be given a small amount of fluid to drink. Depending on your individual set of symptoms, you may also receive an intravenous injection of x-ray contrast/dye. Plan on being at [facility] for 30 minutes or longer, depending on the type of exam you are having performed.  This test typically takes 30-45 minutes to complete.  If you have any questions regarding your exam or if you need to reschedule, you may call the CT department at [phone] between the hours of 8:00 am and 5:00 pm,  Monday-Friday.  ________________________________________________________________________  We have sent refills on phenergan and pantoprazole.  Your physician has requested that you go to the basement for  lab work before leaving today.  Follow up with Dr Henrene Pastor on 07/28/16 830 am

## 2016-06-09 NOTE — Progress Notes (Addendum)
06/09/2016 Mary [name] [id number] 28-Mar-1976   History of Present Illness:  This is a 40 year old female with history of recurrent pancreatitis, pancreatic pseudocyst present since 2002 s/p placement of stent in 2007 which became infected and was removed (this was all done in [place]). She presented for endoscopic ultrasound and biopsy of her pancreatic cyst, which was done by Dr. Christella Hartigan on 04/14/2013. She had 64mL of thick clear fluid removed without complication and it was sent for cytology, amylase/lipase. She was hospitalized on several occasions in 2014 for nausea and vomiting. CT scan and MRI of her head/brain showed some abnormalities; she was seen by neurology, and has been diagnosed with MS. She has been following with Dr. Everlena Cooper. She was previously on Copaxone, but it does have risk of pancreatitis so that has since been discontinued and she is not currently on treatment. At one point it was eventually determined that maybe her pain medications are causing her nausea, vomiting and these were decreased with resultant decrease in nausea and vomiting. She was also having diarrhea and a fecal elastase was extremely low, therefore, she was started on pancreatic enzymes and continues those at this point. She was seen by Dr. Marilynn Rail at [facility] on one who determined that there was no need for surgery, etc for her pancreatic pseudocyst.  She has not been seen in our office since February 2016 at which time repeat imaging had showed continued improvement of her pancreatic pseudocysts.  She is here today with her mom. She has recurrent complaints of epigastric abdominal pain and nausea and vomiting. Says she has not been able to keep anything down for 2 months in the way of food. They say she has been living on protein supplements. She continues on the pancreatic enzymes, but admits she probably does not take them as consistently as she should. Has to use Phenergan for nausea and vomiting,  which does help to some degree. Continues on pantoprazole 40 mg daily for reflux. EGD February 2016 by Dr. Marina Goodell showed GERD with mild esophagitis. She feels that despite the pantoprazole 40 mg daily she does have quite significant reflux. Since she was here last she was told that she apparently does not have MS but is now been diagnosed with lupus and follows with rheumatology. She is on prednisone 5 mg daily for this. She apparently has also been reporting rectal bleeding. Her mother is a Engineer, civil (consulting) and says that the times that she has seen it has been small amounts of bright red blood that she would consider hemorrhoidal.  Current Medications, Allergies, Past Medical History, Past Surgical History, Family History and Social History were reviewed in [employer] record.   Physical Exam: BP 110/70   Pulse 82   Ht 5\' 3"  (1.6 m)   Wt 236 lb (107 kg)   BMI 41.81 kg/m  General: Well developed white female in no acute distress Head: Normocephalic and atraumatic.  Alopecia. Eyes:  Sclerae anicteric, conjunctiva pink  Ears: Normal auditory acuity Lungs: Clear throughout to auscultation Heart: Regular rate and rhythm Abdomen: Soft, obese, non-distended. Normal bowel sounds.  Epigastric TTP. Rectal:  No external abnormalities.  DRE revealed some hard mobile stool in the rectal vault.  Anoscopy revealed small to medium internal hemorrhoids.  Light brown stool was hemoccult negative. Musculoskeletal: Symmetrical with no gross deformities  Extremities: No edema  Neurological: Alert oriented x 4, grossly non-focal Psychological:  Alert and cooperative. Normal mood and affect  Assessment  and Recommendations: -Recurrent epigastric abdominal pain with nausea and vomiting in the setting of previous issues with recurrent pancreatitis and pancreatic pseudocyst.  We have no seen her close to 2 years.  Will repeat some labs including CBC, CMP, and lipase as well as CT scan of the abdomen and  pelvis with contrast.  We will refill phenergan.   -GERD:  ? If this is contributing somewhat to her symptoms.  Will increase pantoprazole to 40 mg BID. -Pancreatic insufficiency:  Is on pancreatic enzymes.  Continue and actually these are somewhat under-dosed so will increase to 36000 units with meals. -Rectal bleeding:  Sounds like outlet bleeding and has some internal hemorrhoids on exam.  Will treat with hydrocortisone suppositories BID for one week. -Vitamin B12 deficiency:  On PO supplements.  Will recheck level today with her other labs. -Anxiety and depression -Lupus:  On prednisone 5 mg daily.

## 2016-06-10 NOTE — Progress Notes (Signed)
Complicated patient. Often difficult to sort out her pain issues. Agree with initial assessment and plans as outlined. Shanda Bumps to follow-up on studies. Thanks

## 2016-06-13 ENCOUNTER — Inpatient Hospital Stay: Admission: RE | Admit: 2016-06-13 | Payer: Self-pay | Source: Ambulatory Visit

## 2016-06-18 ENCOUNTER — Inpatient Hospital Stay: Admission: RE | Admit: 2016-06-18 | Payer: Self-pay | Source: Ambulatory Visit

## 2016-06-30 ENCOUNTER — Inpatient Hospital Stay: Admission: RE | Admit: 2016-06-30 | Payer: Self-pay | Source: Ambulatory Visit

## 2016-07-07 ENCOUNTER — Inpatient Hospital Stay: Admission: RE | Admit: 2016-07-07 | Payer: Self-pay | Source: Ambulatory Visit

## 2016-07-18 ENCOUNTER — Encounter: Payer: Self-pay | Admitting: Internal Medicine

## 2016-07-28 ENCOUNTER — Ambulatory Visit: Payer: Self-pay | Admitting: Internal Medicine

## 2016-08-01 ENCOUNTER — Encounter: Payer: Medicaid Other | Attending: Physical Medicine & Rehabilitation | Admitting: Registered Nurse

## 2016-08-01 ENCOUNTER — Encounter: Payer: Self-pay | Admitting: Registered Nurse

## 2016-08-01 VITALS — BP 101/67 | HR 70 | Resp 14

## 2016-08-01 DIAGNOSIS — G894 Chronic pain syndrome: Secondary | ICD-10-CM | POA: Diagnosis not present

## 2016-08-01 DIAGNOSIS — M5416 Radiculopathy, lumbar region: Secondary | ICD-10-CM | POA: Diagnosis not present

## 2016-08-01 DIAGNOSIS — K861 Other chronic pancreatitis: Secondary | ICD-10-CM | POA: Insufficient documentation

## 2016-08-01 DIAGNOSIS — R531 Weakness: Secondary | ICD-10-CM

## 2016-08-01 DIAGNOSIS — G35 Multiple sclerosis: Secondary | ICD-10-CM | POA: Diagnosis not present

## 2016-08-01 DIAGNOSIS — M329 Systemic lupus erythematosus, unspecified: Secondary | ICD-10-CM

## 2016-08-01 DIAGNOSIS — M545 Low back pain: Secondary | ICD-10-CM | POA: Insufficient documentation

## 2016-08-01 DIAGNOSIS — Z5181 Encounter for therapeutic drug level monitoring: Secondary | ICD-10-CM

## 2016-08-01 DIAGNOSIS — Z79899 Other long term (current) drug therapy: Secondary | ICD-10-CM

## 2016-08-01 MED ORDER — OXYCODONE HCL 10 MG PO TABS: 10.0000 mg | ORAL_TABLET | Freq: Four times a day (QID) | ORAL | 0 refills | Status: DC | PRN

## 2016-08-01 NOTE — Progress Notes (Signed)
Subjective:    Patient ID: Mary [name], female    DOB: 02-26-1976, 40 y.o.   MRN: [id number]  HPI: [name] was a 40 year old female who returns for follow up for chronic pain and medication refill. She states her pain is located in her  Right arm, right shoulder, lower back radiating into right lower extremity and right knee pain. She rates her pain 7. Her current exercise regime is walking.  She seen Dr. Gennie Alma neuropsychologist and has a follow up appointment scheduled. Also following up with her Psychiatrist  Is at [facility].  Mother in room all questions answered.  Pain Inventory Average Pain 6 Pain Right Now 7 My pain is sharp, burning, stabbing and aching  In the last 24 hours, has pain interfered with the following? General activity 7 Relation with others 3 Enjoyment of life 4 What TIME of day is your pain at its worst? morning, night Sleep (in general) Fair  Pain is worse with: bending and some activites Pain improves with: medication Relief from Meds: 6  Mobility walk without assistance ability to climb steps?  yes do you drive?  no  Function disabled: date disabled . I need assistance with the following:  shopping  Neuro/Psych numbness tingling confusion depression anxiety  Prior Studies Any changes since last visit?  no  Physicians involved in your care Any changes since last visit?  no   Family History  Problem Relation Age of Onset  . Diabetes Mother   . Diabetes Father   . Colitis Maternal Aunt   . Diabetes      many family members  . Lupus Neg Hx   . Sarcoidosis Neg Hx   . Pancreatitis Neg Hx   . Ataxia Neg Hx   . Chorea Neg Hx   . Dementia Neg Hx   . Mental retardation Neg Hx   . Migraines Neg Hx   . Multiple sclerosis Neg Hx   . Neurofibromatosis Neg Hx   . Neuropathy Neg Hx   . Parkinsonism Neg Hx   . Seizures Neg Hx   . Stroke Neg Hx   . Colon cancer Neg Hx    Social History   Social History    . Marital status: Single    Spouse name: N/A  . Number of children: 0  . Years of education: N/A   Social History Main Topics  . Smoking status: Former Smoker    Packs/day: 0.50    Years: 20.00    Types: Cigarettes    Quit date: 05/22/2013  . Smokeless tobacco: Never Used  . Alcohol use No  . Drug use: No  . Sexual activity: No   Other Topics Concern  . None   Social History Narrative   ** Merged History Encounter **       Lives permanently in [place] with mom and stepfather.  Has not started working yet and was unemployed in [place].            Past Surgical History:  Procedure Laterality Date  . ABDOMINAL SURGERY  ~ 2007   some sort of pancreatic cyst drainage.   . ABDOMINAL SURGERY    . ADENOIDECTOMY    . CHOLECYSTECTOMY     ~ age 42-laparoscopic  . CHOLECYSTECTOMY    . EUS N/A 04/14/2013   Procedure: UPPER ENDOSCOPIC ULTRASOUND (EUS) LINEAR;  Surgeon: Rachael Fee, MD;  Location: [facility];  Service: Endoscopy;  Laterality: N/A;  . ROUX-EN-Y  GASTRIC BYPASS    . ROUX-EN-Y PROCEDURE     stent to pancreatic cyst that became infected within 36 hours   Past Medical History:  Diagnosis Date  . Abdominal hernia   . Adrenal insufficiency (HCC) 04/23/2013   ??  Marland Kitchen Anxiety   . Depression    "recent breakup with partner of 15 yrs"  . Depression   . GERD (gastroesophageal reflux disease)   . Hernia 03-24-13   ventral hernia remains   . MS (multiple sclerosis) (HCC)   . Pancreatic cyst 2002 onset  . Pancreatitis 03-24-13   2002, 2'2013, 03-14-13  . Ventral hernia    BP 101/67 (BP Location: Left Arm, Patient Position: Sitting, Cuff Size: Large)   Pulse 70   Resp 14   SpO2 99%   Opioid Risk Score:   Fall Risk Score:  `1  Depression screen PHQ 2/9  Depression screen [facility] 2/9 04/09/2016 10/01/2015 05/21/2015 11/01/2014  Decreased Interest 2 2 2 2   Down, Depressed, Hopeless 2 2 - 2  PHQ - 2 Score 4 4 2 4   Altered sleeping - - - 1  Tired, decreased energy - - - 1   Change in appetite - - - 3  Feeling bad or failure about yourself  - - - 0  Trouble concentrating - - - 1  Moving slowly or fidgety/restless - - - 0  PHQ-9 Score - - - 10    Review of Systems  HENT: Positive for congestion, ear pain, postnasal drip, rhinorrhea, sneezing and sore throat.   Eyes: Negative.   Respiratory: Positive for cough and wheezing.   Gastrointestinal: Positive for abdominal pain, nausea and vomiting.  Endocrine: Negative.   Genitourinary: Negative.   Musculoskeletal: Positive for arthralgias, back pain, myalgias, neck pain and neck stiffness.  Skin: Negative.   Allergic/Immunologic: Negative.   Neurological: Positive for numbness and headaches.       Tingling   Hematological: Negative.   Psychiatric/Behavioral: Positive for confusion and dysphoric mood. The patient is nervous/anxious.   All other systems reviewed and are negative.      Objective:   Physical Exam  Constitutional: She is oriented to person, place, and time. She appears well-developed and well-nourished.  HENT:  Head: Normocephalic and atraumatic.  Neck: Normal range of motion. Neck supple.  Cardiovascular: Normal rate and regular rhythm.   Pulmonary/Chest: Effort normal and breath sounds normal.  Musculoskeletal:  Normal Muscle Bulk and Muscle Testing Reveals:  Upper Extremities: Full ROM and Muscle Strength on the Right 4/5 and Left 5/5 Thoracic Paraspinal Tenderness: T-1- T-3 Mainly Right Side Lumbar Paraspinal Tenderness: L-3-L-5 Lower Extremities: Full ROM and Muscle Strength 5/5 Right Lower Extremity Flexion Produces Pain into Right Patella Arises from Table with ease Narrow Based gait  Neurological: She is alert and oriented to person, place, and time.  Skin: Skin is warm and dry.  Psychiatric: She has a normal mood and affect.  Nursing note and vitals reviewed.         Assessment & Plan:  1. MS: [facility] Neurology Following 2. Chronic pain syndrome related to MS  with primarily neuropathic right sided pain : Continue Topamax, Oxycodone .Refilled: Oxycodone 10 mg one tablet every 6 hours as needed. #120 and Tramadol 50 mg one tablet twice a day as needed for moderate pain. 3. Low back pain with Radiculopathy RLE/: Continued topamax.  4. Chronic pancreatitis: PCP and Gastroenterologist Following  of face to face patient care time was spent during this visit.  All questions were encouraged and answered.

## 2016-08-04 ENCOUNTER — Inpatient Hospital Stay: Admission: RE | Admit: 2016-08-04 | Payer: Self-pay | Source: Ambulatory Visit

## 2016-08-07 LAB — TOXASSURE SELECT,+ANTIDEPR,UR

## 2016-08-13 NOTE — Progress Notes (Signed)
Urine drug screen for this encounter is consistent for prescribed medication 

## 2016-08-14 ENCOUNTER — Inpatient Hospital Stay: Admission: RE | Admit: 2016-08-14 | Payer: Self-pay | Source: Ambulatory Visit

## 2016-08-23 ENCOUNTER — Emergency Department (HOSPITAL_COMMUNITY)
Admission: EM | Admit: 2016-08-23 | Discharge: 2016-08-24 | Disposition: A | Discharge status: Home / Self Care | Payer: Medicaid Other | Attending: Emergency Medicine | Admitting: Emergency Medicine

## 2016-08-23 ENCOUNTER — Encounter (HOSPITAL_COMMUNITY): Payer: Self-pay | Admitting: Emergency Medicine

## 2016-08-23 ENCOUNTER — Emergency Department (HOSPITAL_COMMUNITY): Payer: Medicaid Other

## 2016-08-23 DIAGNOSIS — Z87891 Personal history of nicotine dependence: Secondary | ICD-10-CM | POA: Diagnosis not present

## 2016-08-23 DIAGNOSIS — Z91040 Latex allergy status: Secondary | ICD-10-CM | POA: Insufficient documentation

## 2016-08-23 DIAGNOSIS — Z8673 Personal history of transient ischemic attack (TIA), and cerebral infarction without residual deficits: Secondary | ICD-10-CM | POA: Insufficient documentation

## 2016-08-23 DIAGNOSIS — R197 Diarrhea, unspecified: Secondary | ICD-10-CM | POA: Diagnosis not present

## 2016-08-23 DIAGNOSIS — R112 Nausea with vomiting, unspecified: Secondary | ICD-10-CM | POA: Diagnosis not present

## 2016-08-23 DIAGNOSIS — R1013 Epigastric pain: Secondary | ICD-10-CM | POA: Insufficient documentation

## 2016-08-23 DIAGNOSIS — Z7982 Long term (current) use of aspirin: Secondary | ICD-10-CM | POA: Insufficient documentation

## 2016-08-23 LAB — COMPREHENSIVE METABOLIC PANEL
ALBUMIN: 3.9 g/dL (ref 3.5–5.0)
ALT: 15 U/L (ref 14–54)
AST: 15 U/L (ref 15–41)
Alkaline Phosphatase: 116 U/L (ref 38–126)
Anion gap: 8 (ref 5–15)
BILIRUBIN TOTAL: 0.3 mg/dL (ref 0.3–1.2)
BUN: 14 mg/dL (ref 6–20)
CHLORIDE: 114 mmol/L — AB (ref 101–111)
CO2: 18 mmol/L — ABNORMAL LOW (ref 22–32)
CREATININE: 0.58 mg/dL (ref 0.44–1.00)
Calcium: 8.3 mg/dL — ABNORMAL LOW (ref 8.9–10.3)
GFR calc Af Amer: >60 mL/min (ref >60)
GFR calc non Af Amer: >60 mL/min (ref >60)
GLUCOSE: 85 mg/dL (ref 65–99)
POTASSIUM: 3.9 mmol/L (ref 3.5–5.1)
Sodium: 140 mmol/L (ref 135–145)
TOTAL PROTEIN: 7.1 g/dL (ref 6.5–8.1)

## 2016-08-23 LAB — URINALYSIS, ROUTINE W REFLEX MICROSCOPIC
BILIRUBIN URINE: NEGATIVE
GLUCOSE, UA: NEGATIVE mg/dL
KETONES UR: NEGATIVE mg/dL
Leukocytes, UA: NEGATIVE
Nitrite: NEGATIVE
PH: 5 (ref 5.0–8.0)
Protein, ur: NEGATIVE mg/dL
Specific Gravity, Urine: 1.01 (ref 1.005–1.030)

## 2016-08-23 LAB — CBC
HEMATOCRIT: 38.5 % (ref 36.0–46.0)
Hemoglobin: 12.7 g/dL (ref 12.0–15.0)
MCH: 29.5 pg (ref 26.0–34.0)
MCHC: 33 g/dL (ref 30.0–36.0)
MCV: 89.3 fL (ref 78.0–100.0)
Platelets: 334 10*3/uL (ref 150–400)
RBC: 4.31 MIL/uL (ref 3.87–5.11)
RDW: 14.9 % (ref 11.5–15.5)
WBC: 12.6 10*3/uL — ABNORMAL HIGH (ref 4.0–10.5)

## 2016-08-23 LAB — LIPASE, BLOOD: Lipase: 19 U/L (ref 11–51)

## 2016-08-23 LAB — URINALYSIS, MICROSCOPIC (REFLEX): Bacteria, UA: NONE SEEN

## 2016-08-23 LAB — I-STAT BETA HCG BLOOD, ED (MC, WL, AP ONLY): I-stat hCG, quantitative: <5 m[IU]/mL (ref <5)

## 2016-08-23 MED ORDER — ONDANSETRON HCL 4 MG/2ML IJ SOLN
4.0000 mg | Freq: Once | INTRAMUSCULAR | Status: AC
  Administered 2016-08-23: 4 mg via INTRAVENOUS
  Filled 2016-08-23: qty 2

## 2016-08-23 MED ORDER — HYDROMORPHONE HCL 1 MG/ML IJ SOLN
1.0000 mg | Freq: Once | INTRAMUSCULAR | Status: AC
  Administered 2016-08-23: 1 mg via INTRAVENOUS
  Filled 2016-08-23: qty 1

## 2016-08-23 MED ORDER — SODIUM CHLORIDE 0.9 % IV BOLUS (SEPSIS)
1000.0000 mL | Freq: Once | INTRAVENOUS | Status: AC
  Administered 2016-08-23: 1000 mL via INTRAVENOUS

## 2016-08-23 MED ORDER — PROMETHAZINE HCL 25 MG PO TABS
25.0000 mg | ORAL_TABLET | Freq: Once | ORAL | Status: AC
  Administered 2016-08-24: 25 mg via ORAL
  Filled 2016-08-23: qty 1

## 2016-08-23 MED ORDER — HYDROCORTISONE NA SUCCINATE PF 100 MG IJ SOLR
100.0000 mg | Freq: Once | INTRAMUSCULAR | Status: AC
  Administered 2016-08-23: 100 mg via INTRAVENOUS
  Filled 2016-08-23: qty 2

## 2016-08-23 MED ORDER — PREDNISONE 20 MG PO TABS
10.0000 mg | ORAL_TABLET | Freq: Once | ORAL | Status: AC
  Administered 2016-08-23: 10 mg via ORAL
  Filled 2016-08-23: qty 1

## 2016-08-23 MED ORDER — IOPAMIDOL (ISOVUE-300) INJECTION 61%
100.0000 mL | Freq: Once | INTRAVENOUS | Status: AC | PRN
  Administered 2016-08-23: 100 mL via INTRAVENOUS

## 2016-08-23 MED ORDER — FAMOTIDINE IN NACL 20-0.9 MG/50ML-% IV SOLN
20.0000 mg | Freq: Once | INTRAVENOUS | Status: AC
  Administered 2016-08-23: 20 mg via INTRAVENOUS
  Filled 2016-08-23: qty 50

## 2016-08-23 MED ORDER — IOPAMIDOL (ISOVUE-300) INJECTION 61%
INTRAVENOUS | Status: AC
  Filled 2016-08-23: qty 100

## 2016-08-23 NOTE — ED Notes (Signed)
Provided patient ginger ale for a po challenge.

## 2016-08-23 NOTE — ED Notes (Signed)
Pt having labs drawn at present time.

## 2016-08-23 NOTE — ED Triage Notes (Signed)
Pt complaint of right side abdominal pain with associated n/v/d onset yesterday; pt denies fevers; pt verbalizes hx of pancreatitis and states this pain is similar.

## 2016-08-23 NOTE — ED Notes (Signed)
Pt reports she is unable to obtain a urine sample and reports she just went to restroom.

## 2016-08-23 NOTE — ED Provider Notes (Signed)
[facility]-EMERGENCY DEPT Provider Note   CSN: [id number] Arrival date & time: 08/23/16  1655     History   Chief Complaint Chief Complaint  Patient presents with  . Abdominal Pain    HPI Mary [name] is a 41 y.o. female.  The history is provided by the patient. No language interpreter was used.  Abdominal Pain     Mary [name] is a 41 y.o. female who presents to the Emergency Department complaining of abdominal pain.  She has a history of pancreatitis and is on chronic steroids for lupus. Over the last day she's had severe epigastric pain with associated vomiting and diarrhea. She describes her vomiting and diarrhea as numerous episodes. Symptoms are similar to her prior pancreatitis episodes. In vomiting all her medications and unable to take anything by mouth. No reports of fevers. No known sick contacts. No dysuria. She does endorse decreased urinary output. Past Medical History:  Diagnosis Date  . Abdominal hernia   . Adrenal insufficiency (HCC) 04/23/2013   ??  Marland Kitchen Anxiety   . Depression    "recent breakup with partner of 15 yrs"  . Depression   . GERD (gastroesophageal reflux disease)   . Hernia 03-24-13   ventral hernia remains   . MS (multiple sclerosis) (HCC)   . Pancreatic cyst 2002 onset  . Pancreatitis 03-24-13   2002, 2'2013, 03-14-13  . Ventral hernia     Patient Active Problem List   Diagnosis Date Noted  . History of pancreatitis 06/09/2016  . Rectal bleeding 06/09/2016  . Gastroesophageal reflux disease with esophagitis 06/09/2016  . Right arm weakness   . Stroke-like symptoms   . Headache 11/29/2015  . Major depressive disorder, recurrent episode, moderate (HCC) 07/23/2015  . TIA (transient ischemic attack) 07/22/2015  . Vascular headache 07/22/2015  . Lupus 07/22/2015  . Intractable chronic migraine without aura and without status migrainosus   . Conversion disorder, acute episode, with weakness or paralysis   . Systemic lupus erythematosus related  syndrome (HCC) 05/21/2015  . Stuttering   . UTI (lower urinary tract infection) 04/22/2015  . Fever   . Cephalalgia   . Right sided weakness   . Functional neurologic complaint   . Right sided weakness   . White matter changes   . Multiple sclerosis (HCC) 12/29/2014  . Right hemiparesis (HCC) 11/01/2014  . Lumbar radiculopathy 11/01/2014  . Low back pain 11/01/2014  . 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 Pancreatic pseudocyst 10/02/2014  . Nausea with vomiting 10/02/2014  . Nerve pain 09/25/2014  . Fever 04/01/2014  . Idiopathic acute pancreatitis 03/11/2014  . Nausea and vomiting 03/11/2014  . Abdominal pain, epigastric 03/11/2014  . Adjustment disorder with mixed anxiety and depressed mood 02/23/2014  . Thrush, oral 02/12/2014  . GERD (gastroesophageal reflux disease) 02/11/2014  . Abdominal pain 07/26/2013  . Nausea and vomiting in adult 07/26/2013  . Pancreatitis, recurrent (HCC) 06/14/2013  . Low serum cortisol level (HCC) 04/25/2013  . ? Adrenal insufficiency- Work up negative 04/23/2013  . Chronic pancreatitis with chronic pseudocysts 04/23/2013  . Abnormal MRI 04/23/2013  . Persistent vomiting 04/22/2013  . Diarrhea 04/21/2013  . Anemia 04/20/2013  . Protein-calorie malnutrition, severe (HCC) 04/16/2013  . Obesity, morbid (HCC) 03/15/2013  . Pancreatic cyst 03/15/2013  . Acute inflammation of the pancreas 03/15/2013  . Cyst and pseudocyst of pancreas 03/15/2013  . Extreme obesity (HCC) 03/15/2013    Past Surgical History:  Procedure Laterality Date  . ABDOMINAL  SURGERY  ~ 2007   some sort of pancreatic cyst drainage.   . ABDOMINAL SURGERY    . ADENOIDECTOMY    . CHOLECYSTECTOMY     ~ age 35-laparoscopic  . CHOLECYSTECTOMY    . EUS N/A 04/14/2013   Procedure: UPPER ENDOSCOPIC ULTRASOUND (EUS) LINEAR;  Surgeon: Rachael Fee, MD;  Location: [facility] ENDOSCOPY;  Service: Endoscopy;  Laterality: N/A;  . ROUX-EN-Y GASTRIC BYPASS     . ROUX-EN-Y PROCEDURE     stent to pancreatic cyst that became infected within 36 hours    OB History    Gravida Para Term Preterm AB Living   0 0 0 0 0     SAB TAB Ectopic Multiple Live Births   0 0 0           Home Medications    Prior to Admission medications   Medication Sig Start Date End Date Taking? Authorizing Provider  albuterol (PROVENTIL HFA;VENTOLIN HFA) 108 (90 Base) MCG/ACT inhaler Inhale 2 puffs into the lungs every 6 (six) hours as needed for shortness of breath. 08/01/16 08/01/17 Yes Historical Provider, MD  aspirin 81 MG chewable tablet Chew 1 tablet (81 mg total) by mouth daily. 04/25/15  Yes Arvilla Market, DO  clonazePAM (KLONOPIN) 0.5 MG tablet Take 2 tablets (1 mg total) by mouth 2 (two) times daily. 06/04/16  Yes Ranelle Oyster, MD  hydrocortisone (ANUSOL-HC) 25 MG suppository Place 1 suppository (25 mg total) rectally at bedtime. Patient taking differently: Place 25 mg rectally at bedtime as needed for hemorrhoids or itching.  06/09/16  Yes Jessica D Zehr, PA-C  hydroxychloroquine (PLAQUENIL) 200 MG tablet Take 300 mg by mouth daily.    Yes Historical Provider, MD  lipase/protease/amylase (CREON-12/PANCREASE) 12000 UNITS CPEP capsule Take 2 capsules by mouth 3 (three) times daily with meals. 07/31/13  Yes Catarina Hartshorn, MD  methocarbamol (ROBAXIN) 500 MG tablet Take 1 tablet (500 mg total) by mouth every 6 (six) hours as needed for muscle spasms. Patient taking differently: Take 500 mg by mouth 2 (two) times daily.  06/04/16  Yes Ranelle Oyster, MD  Oxycodone HCl 10 MG TABS Take 1 tablet (10 mg total) by mouth every 6 (six) hours as needed (severe pain). 08/01/16  Yes Jones Bales, NP  pantoprazole (PROTONIX) 40 MG tablet Take 1 tablet (40 mg total) by mouth 2 (two) times daily. 06/09/16  Yes Jessica D Zehr, PA-C  pravastatin (PRAVACHOL) 10 MG tablet Take 1 tablet (10 mg total) by mouth at bedtime. 07/24/15  Yes Ripudeep Jenna Luo, MD  predniSONE  (DELTASONE) 5 MG tablet Take 1 tablet (5 mg total) by mouth daily with breakfast. Patient taking differently: Take 10 mg by mouth daily with breakfast.  12/04/15  Yes Ruben Im, MD  promethazine (PHENERGAN) 25 MG tablet TAKE 1 TABLET BY MOUTH EVERY 6 HOURS AS NEEDED FOR NAUSEA/VOMITING 06/09/16  Yes Jessica D Zehr, PA-C  sertraline (ZOLOFT) 100 MG tablet Take 200 mg by mouth daily.    Yes Historical Provider, MD  topiramate (TOPAMAX) 100 MG tablet Take 100 mg q am and may take 50 mg pm for headache and neuropathic pain 05/20/16  Yes Ranelle Oyster, MD  triamcinolone cream (KENALOG) 0.1 % Apply 1 application topically daily as needed (eczema).  02/23/14  Yes Historical Provider, MD  vitamin B-12 (CYANOCOBALAMIN) 1000 MCG tablet Take 1 tablet (1,000 mcg total) by mouth daily. 12/04/15  Yes Ruben Im, MD    Family History Family History  Problem Relation Age of Onset  . Diabetes Mother   . Diabetes Father   . Colitis Maternal Aunt   . Diabetes      many family members  . Lupus Neg Hx   . Sarcoidosis Neg Hx   . Pancreatitis Neg Hx   . Ataxia Neg Hx   . Chorea Neg Hx   . Dementia Neg Hx   . Mental retardation Neg Hx   . Migraines Neg Hx   . Multiple sclerosis Neg Hx   . Neurofibromatosis Neg Hx   . Neuropathy Neg Hx   . Parkinsonism Neg Hx   . Seizures Neg Hx   . Stroke Neg Hx   . Colon cancer Neg Hx     Social History Social History  Substance Use Topics  . Smoking status: Former Smoker    Packs/day: 0.50    Years: 20.00    Types: Cigarettes    Quit date: 05/22/2013  . Smokeless tobacco: Never Used  . Alcohol use No     Allergies   Amoxicillin; Latex; Morphine and related; Penicillins; Meat extract; Morphine and related; and Buprenorphine hcl   Review of Systems Review of Systems  Gastrointestinal: Positive for abdominal pain.  All other systems reviewed and are negative.    Physical Exam Updated Vital Signs BP 110/76 (BP Location: Left Arm)   Pulse 79   Temp  97.5 F (36.4 C) (Oral)   Resp 20   Ht 5\' 3"  (1.6 m)   Wt 234 lb 4 oz (106.3 kg)   LMP 08/10/2016   SpO2 95%   BMI 41.50 kg/m   Physical Exam  Constitutional: She is oriented to person, place, and time. She appears well-developed and well-nourished.  cushingoid  HENT:  Head: Normocephalic and atraumatic.  Cardiovascular: Normal rate and regular rhythm.   No murmur heard. Pulmonary/Chest: Effort normal and breath sounds normal. No respiratory distress.  Abdominal: Soft. There is no rebound and no guarding.  Moderate epigastric tenderness  Musculoskeletal: She exhibits no edema or tenderness.  Neurological: She is alert and oriented to person, place, and time.  Skin: Skin is warm and dry.  Psychiatric: She has a normal mood and affect. Her behavior is normal.  Nursing note and vitals reviewed.    ED Treatments / Results  Labs (all labs ordered are listed, but only abnormal results are displayed) Labs Reviewed  COMPREHENSIVE METABOLIC PANEL - Abnormal; Notable for the following:       Result Value   Chloride 114 (*)    CO2 18 (*)    Calcium 8.3 (*)    All other components within normal limits  CBC - Abnormal; Notable for the following:    WBC 12.6 (*)    All other components within normal limits  URINALYSIS, ROUTINE W REFLEX MICROSCOPIC - Abnormal; Notable for the following:    Hgb urine dipstick SMALL (*)    All other components within normal limits  URINALYSIS, MICROSCOPIC (REFLEX) - Abnormal; Notable for the following:    Squamous Epithelial / LPF 0-5 (*)    All other components within normal limits  LIPASE, BLOOD  I-STAT BETA HCG BLOOD, ED ([facility], [facility], [facility] ONLY)    EKG  EKG Interpretation None       Radiology Ct Abdomen Pelvis W Contrast  Addendum Date: 08/23/2016   ADDENDUM REPORT: 08/23/2016 22:00 ADDENDUM: Additional finding of punctate nonobstructing stones within the right kidney, best seen on coronal views Electronically Signed   By: Jasmine Pang  M.D.    On: 08/23/2016 22:00   Result Date: 08/23/2016 CLINICAL DATA:  Right-sided abdominal pain with nausea vomiting and diarrhea EXAM: CT ABDOMEN AND PELVIS WITH CONTRAST TECHNIQUE: Multidetector CT imaging of the abdomen and pelvis was performed using the standard protocol following bolus administration of intravenous contrast. CONTRAST:  ISOVUE-300 IOPAMIDOL (ISOVUE-300) INJECTION 61% COMPARISON:  10/18/2014, 06/15/2014, 07/30/2013 FINDINGS: Lower chest: Lung bases demonstrate no acute infiltrate, consolidation, or pleural effusion. Heart size is nonenlarged. Hepatobiliary: Mild decreased density of the liver suggests fatty change. Stable enlargement of the caudate lobe. No focal hepatic abnormality. Surgical clips in the gallbladder fossa. Pancreas: No peripancreatic soft tissue inflammation. 2.5 x 1.9 cm hypodense lesion in the pancreas body/tail, unchanged, previously measuring 2.5 x 1.9 cm. No ductal dilatation Spleen: Normal in size without focal abnormality. Adrenals/Urinary Tract: Right adrenal gland normal. Stable 12 mm nodule left adrenal, likely adenoma. Kidneys show no hydronephrosis. The bladder is normal Stomach/Bowel: Stomach is nonenlarged. Postsurgical changes of small bowel in the upper abdomen with chronically dilated small bowel in the left abdomen. No convincing evidence for a bowel obstruction. There is no colon wall thickening. Vascular/Lymphatic: Non aneurysmal aorta. Stable periesophageal lymph nodes. No enlarged intra-abdominal or pelvic nodes. Reproductive: Uterus and bilateral adnexa are unremarkable. Other: No free air or free fluid. Scarring within the anterior abdominal wall with apparent interval repair of previously noted fatty ventral hernias. Musculoskeletal: No acute or suspicious bone lesions. IMPRESSION: 1. No CT evidence for acute intra-abdominal or pelvic pathology 2. Stable hypodense lesion within the distal body/tail of the pancreas. 3. Stable left adrenal gland probable  adenoma 4. Interim postsurgical changes of the anterior abdominal wall with repair of previously noted fat containing ventral hernias. 5. Postsurgical changes of the small bowel with enlarged small bowel loops in the central to left abdomen, similar compared to prior. No convincing evidence for a bowel obstruction Electronically Signed: By: Jasmine Pang M.D. On: 08/23/2016 21:04    Procedures Procedures (including critical care time)  Medications Ordered in ED Medications  iopamidol (ISOVUE-300) 61 % injection (not administered)  sodium chloride 0.9 % bolus 1,000 mL (0 mLs Intravenous Stopped 08/23/16 2207)  HYDROmorphone (DILAUDID) injection 1 mg (1 mg Intravenous Given 08/23/16 2013)  ondansetron (ZOFRAN) injection 4 mg (4 mg Intravenous Given 08/23/16 2011)  iopamidol (ISOVUE-300) 61 % injection 100 mL (100 mLs Intravenous Contrast Given 08/23/16 2037)  HYDROmorphone (DILAUDID) injection 1 mg (1 mg Intravenous Given 08/23/16 2120)  ondansetron (ZOFRAN) injection 4 mg (4 mg Intravenous Given 08/23/16 2118)  hydrocortisone sodium succinate (SOLU-CORTEF) 100 MG injection 100 mg (100 mg Intravenous Given 08/23/16 2305)  predniSONE (DELTASONE) tablet 10 mg (10 mg Oral Given 08/23/16 2305)  famotidine (PEPCID) IVPB 20 mg premix (0 mg Intravenous Stopped 08/23/16 2345)  promethazine (PHENERGAN) tablet 25 mg (25 mg Oral Given 08/24/16 0014)     Initial Impression / Assessment and Plan / ED Course  I have reviewed the triage vital signs and the nursing notes.  Pertinent labs & imaging results that were available during my care of the patient were reviewed by me and considered in my medical decision making (see chart for details).  Clinical Course     Patient with history of pancreatitis, on chronic steroids here with abdominal pain, vomiting, diarrhea. CBC with mild leukocytosis, no evidence of acute infectious process. Her pain is partially improved on repeat evaluation. CT abdomen with no acute abnormalities.  Given her vomiting she was given a stress dose of her  steroids and oral doses of her home medications were given. Plan to DC home with close outpatient follow-up and return precautions.  Final Clinical Impressions(s) / ED Diagnoses   Final diagnoses:  Nausea vomiting and diarrhea  Epigastric abdominal pain    New Prescriptions Discharge Medication List as of 08/23/2016 11:54 PM       Tilden Fossa, MD 08/24/16 [phone]

## 2016-08-25 ENCOUNTER — Inpatient Hospital Stay: Admission: RE | Admit: 2016-08-25 | Payer: Self-pay | Source: Ambulatory Visit

## 2016-08-25 ENCOUNTER — Telehealth: Payer: Self-pay | Admitting: Gastroenterology

## 2016-08-28 NOTE — Telephone Encounter (Signed)
FYI

## 2016-09-03 ENCOUNTER — Encounter: Payer: Self-pay | Admitting: Internal Medicine

## 2016-09-04 ENCOUNTER — Ambulatory Visit: Payer: Self-pay | Admitting: Internal Medicine

## 2016-10-01 ENCOUNTER — Encounter: Payer: Self-pay | Admitting: Physical Medicine & Rehabilitation

## 2016-10-01 ENCOUNTER — Encounter: Payer: Medicaid Other | Attending: Physical Medicine & Rehabilitation | Admitting: Physical Medicine & Rehabilitation

## 2016-10-01 VITALS — BP 91/61 | HR 76 | Resp 14

## 2016-10-01 DIAGNOSIS — M545 Low back pain: Secondary | ICD-10-CM | POA: Insufficient documentation

## 2016-10-01 DIAGNOSIS — R531 Weakness: Secondary | ICD-10-CM

## 2016-10-01 DIAGNOSIS — Z5181 Encounter for therapeutic drug level monitoring: Secondary | ICD-10-CM | POA: Diagnosis not present

## 2016-10-01 DIAGNOSIS — G35 Multiple sclerosis: Secondary | ICD-10-CM | POA: Insufficient documentation

## 2016-10-01 DIAGNOSIS — G43719 Chronic migraine without aura, intractable, without status migrainosus: Secondary | ICD-10-CM | POA: Diagnosis not present

## 2016-10-01 DIAGNOSIS — M329 Systemic lupus erythematosus, unspecified: Secondary | ICD-10-CM | POA: Diagnosis not present

## 2016-10-01 DIAGNOSIS — K861 Other chronic pancreatitis: Secondary | ICD-10-CM | POA: Insufficient documentation

## 2016-10-01 DIAGNOSIS — G894 Chronic pain syndrome: Secondary | ICD-10-CM | POA: Diagnosis not present

## 2016-10-01 DIAGNOSIS — Z79899 Other long term (current) drug therapy: Secondary | ICD-10-CM

## 2016-10-01 MED ORDER — OXYCODONE HCL 10 MG PO TABS: 10.0000 mg | ORAL_TABLET | Freq: Four times a day (QID) | ORAL | 0 refills | Status: DC | PRN

## 2016-10-01 MED ORDER — TOPIRAMATE 100 MG PO TABS: ORAL_TABLET | ORAL | 4 refills | Status: DC

## 2016-10-01 MED ORDER — SUMATRIPTAN SUCCINATE 50 MG PO TABS: 50.0000 mg | ORAL_TABLET | ORAL | 3 refills | Status: DC | PRN

## 2016-10-01 NOTE — Patient Instructions (Signed)
IMPROVE YOUR SLEEP HYGIENE AND DEVELOP MORE OF A ROUTINE. TRY TO FIND THINGS TO HELP "SETTLE YOUR BRAIN DOWN" WHEN YOU GO TO BED SUCH AS READING, MUSIC   PLEASE FEEL FREE TO CALL OUR OFFICE WITH ANY PROBLEMS OR QUESTIONS [phone])

## 2016-10-01 NOTE — Progress Notes (Signed)
Subjective:    Patient ID: Mary [name], female    DOB: 05-14-1976, 41 y.o.   MRN: [id number]  HPI   Mary [name] is here in follow up of her chronic pain and cognitive/gait deficits. She is having worsening headaches over the last couple months. She feels that there is a "lightning storm" in her head. She describes the headaches as "rolling" and continuous. Two or three x per week the headaches are very severe.  She is not finding any relief with medications. She may find some relief by closing her eyes and trying to sleep. The headaches take a lot of energy out of her as well. She denies any visual or auditory aura. No abnormal smells. She is on topamax 100mg  qam and 50mg  qpm for migraine prevention.  She is on oxycodone for breakthrough generalize pain.   She continues with rheumatology follow up. She has seen psychology once with the follow up visit, cancelled due to her provider's personal issues. She is seeing psychiatry in [place] also.      Pain Inventory Average Pain 8 Pain Right Now 8 My pain is sharp, stabbing, tingling and aching  In the last 24 hours, has pain interfered with the following? General activity 5 Relation with others 3 Enjoyment of life 5 What TIME of day is your pain at its worst? morning, evening  Sleep (in general) Poor  Pain is worse with: bending and some activites Pain improves with: medication Relief from Meds: 8  Mobility walk without assistance do you drive?  no  Function disabled: date disabled . I need assistance with the following:  shopping  Neuro/Psych weakness numbness tingling dizziness confusion depression anxiety  Prior Studies Any changes since last visit?  no  Physicians involved in your care Any changes since last visit?  no   Family History  Problem Relation Age of Onset  . Diabetes Mother   . Diabetes Father   . Colitis Maternal Aunt   . Diabetes      many family members  . Lupus Neg Hx   . Sarcoidosis Neg Hx   .  Pancreatitis Neg Hx   . Ataxia Neg Hx   . Chorea Neg Hx   . Dementia Neg Hx   . Mental retardation Neg Hx   . Migraines Neg Hx   . Multiple sclerosis Neg Hx   . Neurofibromatosis Neg Hx   . Neuropathy Neg Hx   . Parkinsonism Neg Hx   . Seizures Neg Hx   . Stroke Neg Hx   . Colon cancer Neg Hx    Social History   Social History  . Marital status: Single    Spouse name: N/A  . Number of children: 0  . Years of education: N/A   Social History Main Topics  . Smoking status: Former Smoker    Packs/day: 0.50    Years: 20.00    Types: Cigarettes    Quit date: 05/22/2013  . Smokeless tobacco: Never Used  . Alcohol use No  . Drug use: No  . Sexual activity: No   Other Topics Concern  . None   Social History Narrative   ** Merged History Encounter **       Lives permanently in [place] with mom and stepfather.  Has not started working yet and was unemployed in [place].            Past Surgical History:  Procedure Laterality Date  . ABDOMINAL SURGERY  ~ 2007   some  sort of pancreatic cyst drainage.   . ABDOMINAL SURGERY    . ADENOIDECTOMY    . CHOLECYSTECTOMY     ~ age 2-laparoscopic  . CHOLECYSTECTOMY    . EUS N/A 04/14/2013   Procedure: UPPER ENDOSCOPIC ULTRASOUND (EUS) LINEAR;  Surgeon: Rachael Fee, MD;  Location: [facility];  Service: Endoscopy;  Laterality: N/A;  . ROUX-EN-Y GASTRIC BYPASS    . ROUX-EN-Y PROCEDURE     stent to pancreatic cyst that became infected within 36 hours   Past Medical History:  Diagnosis Date  . Abdominal hernia   . Adrenal insufficiency (HCC) 04/23/2013   ??  Marland Kitchen Anxiety   . Depression    "recent breakup with partner of 15 yrs"  . Depression   . GERD (gastroesophageal reflux disease)   . Hernia 03-24-13   ventral hernia remains   . MS (multiple sclerosis) (HCC)   . Pancreatic cyst 2002 onset  . Pancreatitis 03-24-13   2002, 2'2013, 03-14-13  . Ventral hernia    BP 91/61   Pulse 76   Resp 14   SpO2 98%   Opioid Risk Score:    Fall Risk Score:  `1  Depression screen PHQ 2/9  Depression screen [facility] 2/9 04/09/2016 10/01/2015 05/21/2015 11/01/2014  Decreased Interest 2 2 2 2   Down, Depressed, Hopeless 2 2 - 2  PHQ - 2 Score 4 4 2 4   Altered sleeping - - - 1  Tired, decreased energy - - - 1  Change in appetite - - - 3  Feeling bad or failure about yourself  - - - 0  Trouble concentrating - - - 1  Moving slowly or fidgety/restless - - - 0  PHQ-9 Score - - - 10    Review of Systems  Constitutional: Positive for unexpected weight change.  Eyes: Negative.   Respiratory: Negative.   Cardiovascular: Positive for leg swelling.  Gastrointestinal: Positive for nausea.  Endocrine: Negative.   Genitourinary: Negative.   Musculoskeletal: Positive for arthralgias, back pain, myalgias and neck pain.  Skin: Negative.   Allergic/Immunologic: Negative.   Neurological: Positive for dizziness, weakness, numbness and headaches.       Tingling   Psychiatric/Behavioral: Positive for confusion and dysphoric mood. The patient is nervous/anxious.   All other systems reviewed and are negative.      Objective:   Physical Exam  General: Alert and oriented x 3, No apparent distress. obese  HEENT:Head is normocephalic, atraumatic, PERRLA, EOMI, sclera anicteric, oral mucosa pink and moist, dentition intact, ext ear canals clear,  Neck:Supple without JVD or lymphadenopathy  Heart:Reg rate and rhythm. No murmurs rubs or gallops  Chest:CTA bilaterally without wheezes, rales, or rhonchi; no distress  Abdomen:NT.  Extremities:No clubbing, cyanosis, or edema. Pulses are 2+  Skin:Clean and intact without signs of breakdown  Neuro:Pt is alert and oriented. Improved insight and awareness. Still some processing delays but very functional. Cranial nerves 2-12 are intact. Decreased sensation right more than left to LT. Reflexes are 3+ rightnd 2+ left. . Gait stable. . No balance issues. Strength is stable at 4 to 4+ to 5/5 on right.     Musculoskeletal: has lumbar and cervical pain with flexion and extension today.  Psych:Pt's affect is more dynamic.    Assessment & Plan:  1. Non-specific bicerebral/brainstem white matter disease---initially suspected to be MS, but not characteristic in presentation. LikelySLE.   -overall much improved today!! 2. Chronic pain syndrome related to above with primarily neuropathic right sided pain. She has  had a recent increase in cervical pain and related headaches over the last several months as well.  3. Low back pain with ? Radiculopathy RLE  4. Chronic pancreatitis  5. Conversion disorder    Plan:  1. Increase topamax 100mg  am and 100mg  qpm for headaches and neuropathic pain.   -imitrex 50mg  for breakthrough migraine headaches 2. Continue psychiatry follow up. Many of her neurological symptoms are likely non-organic in nature given her recent spontaneous improvement and relapse. Pt/family aware -needs psychology follow up too             -reviewed sleep hygiene 3. Oxycodone was refilled. #120 10mg  was refilled today We will continue the opioid monitoring program, this consists of regular clinic visits, examinations, urine drug screen, pill counts as well as use of [place] Controlled Substance Reporting System.  4.  TENS unit prn.  7. Rheumatology follow up as directed.  8. Follow up with [name] in about 2 months with [name]. 30 minutes were spent during this visit. All questions were encouraged and answered. Greater than 50% of time during this encounter was spent counseling patient/family in regard to sleep hygiene, pain mgt, etc.

## 2016-10-09 IMAGING — MR MR LUMBAR SPINE W/O CM
4 of 5 series · 19 of 48 positions shown · non-contrast
Comparison: CT Abdomen and Pelvis 10/18/2014 and earlier.

CLINICAL DATA: 38-year-old female with lumbar back pain radiating
to the right lower extremity with weakness and numbness in the right
foot. Initial encounter.

EXAM:
MRI LUMBAR SPINE WITHOUT CONTRAST
TECHNIQUE: Multiplanar, multisequence MR imaging of the lumbar spine was
performed. No intravenous contrast was administered.

[Series 4: T1 · sagittal · 4.0mm · 0.55mm/px · 3 of 12 slices shown (1 of 2)]
[im 1/12]
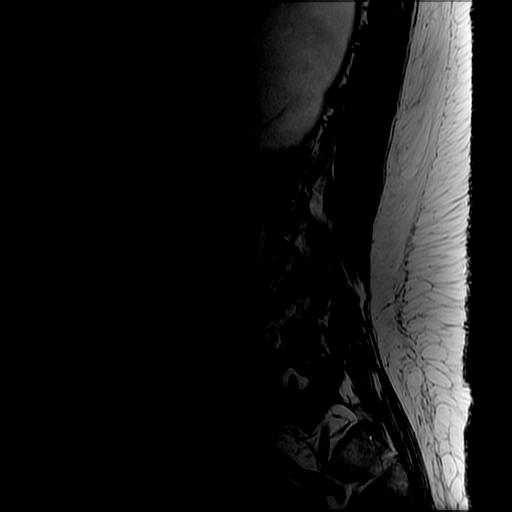
[im 8/12]
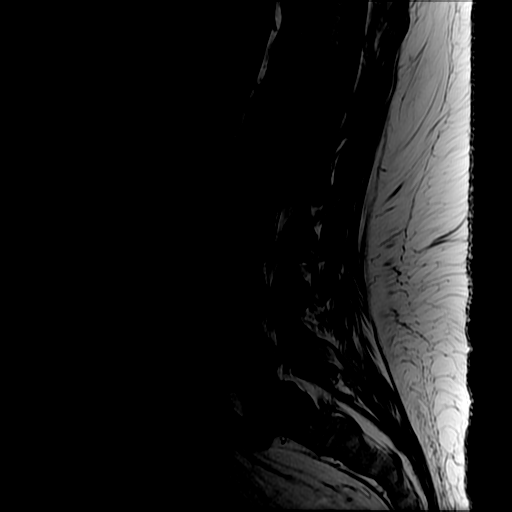
[im 12/12]
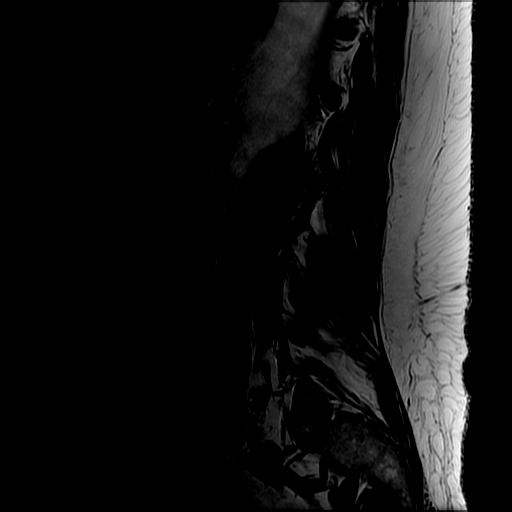

[Series 5: T2 post-contrast · sagittal · 4.0mm · 0.55mm/px · 5 of 12 slices shown]
[im 1/12]
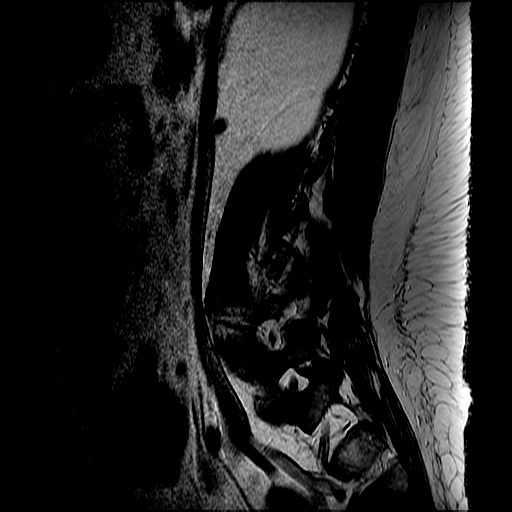
[im 3/12]
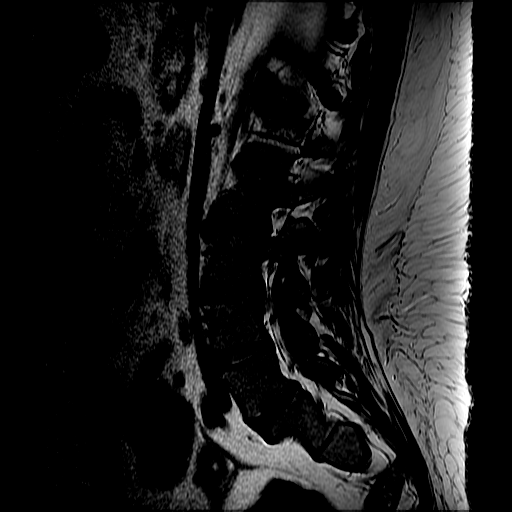
[im 6/12]
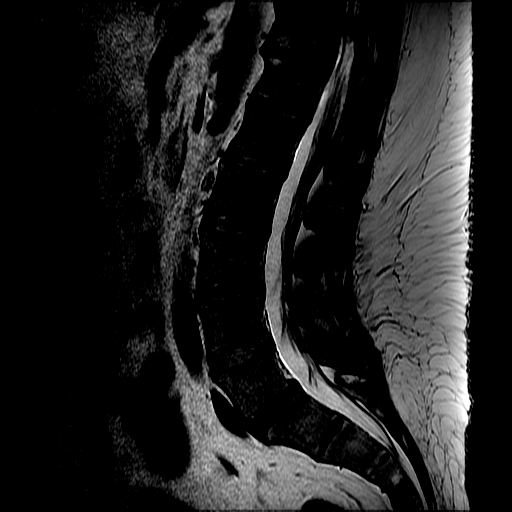
[im 9/12]
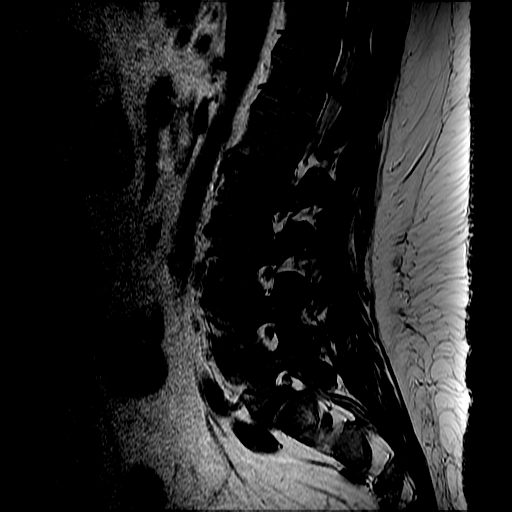
[im 12/12]
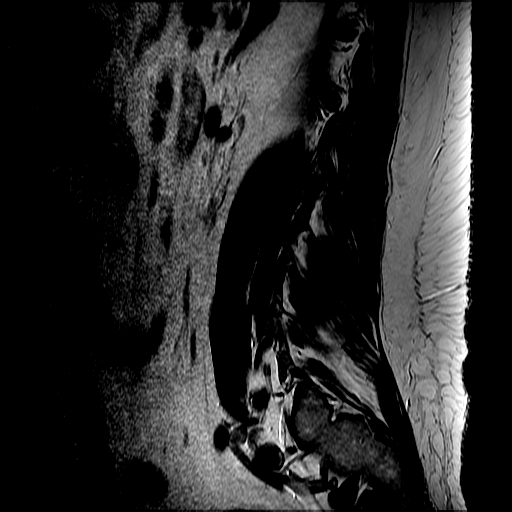

[Series 7: T2 · axial · 4.0mm · 0.39mm/px · z∈[-124,+72]mm · 8 of 40 slices shown]
[im 3/40]
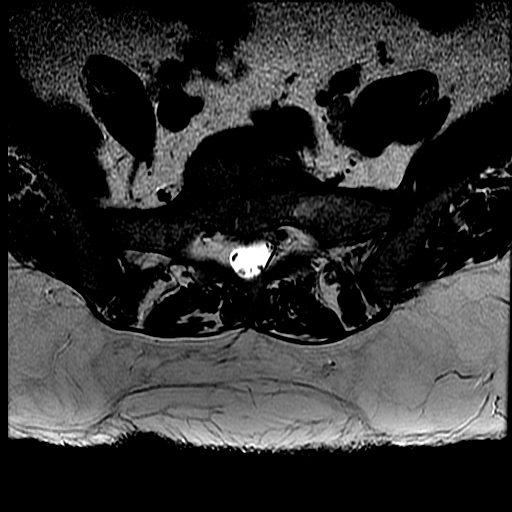
[im 5/40]
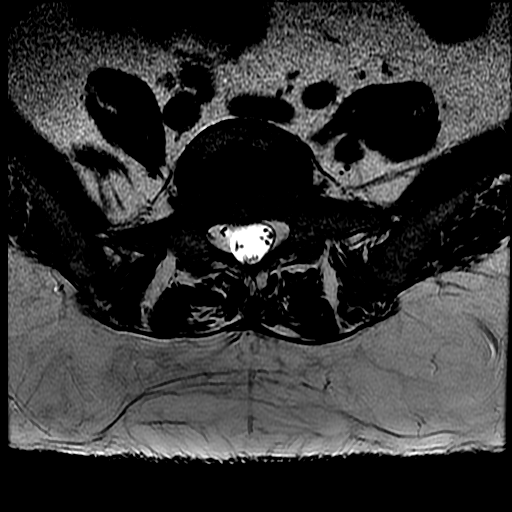
[im 8/40]
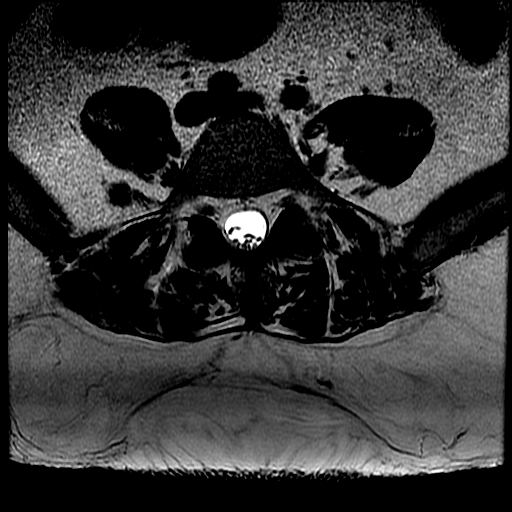
[im 13/40]
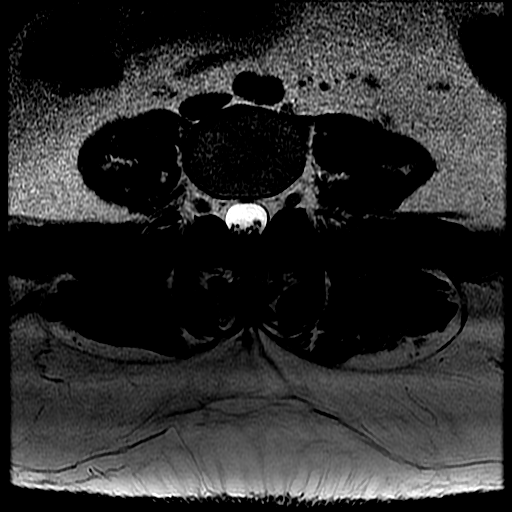
[im 18/40]
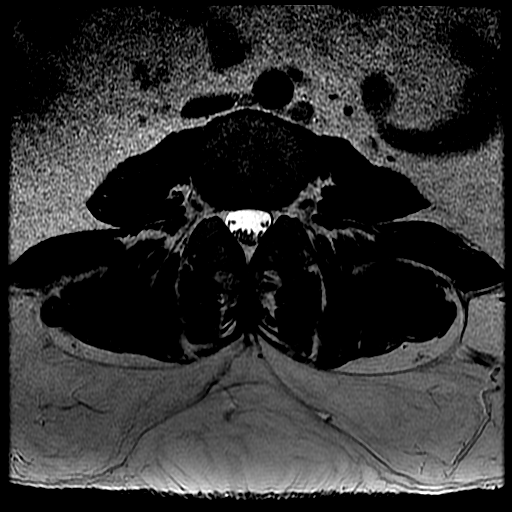
[im 20/40]
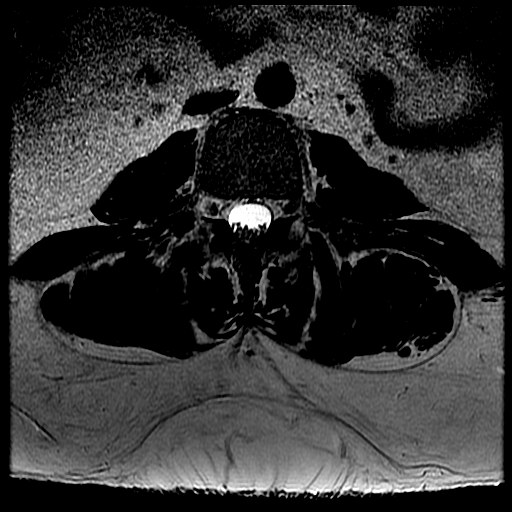
[im 22/40]
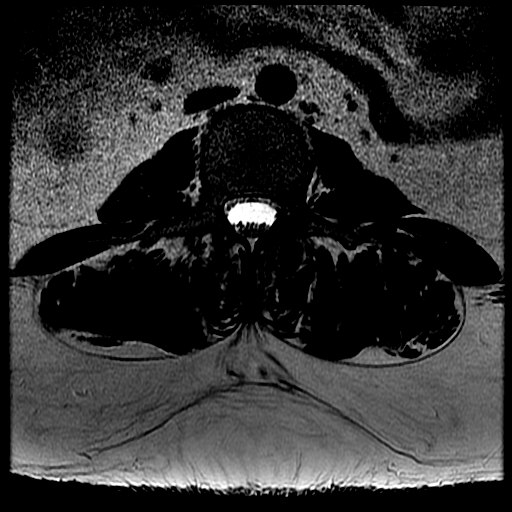
[im 35/40]
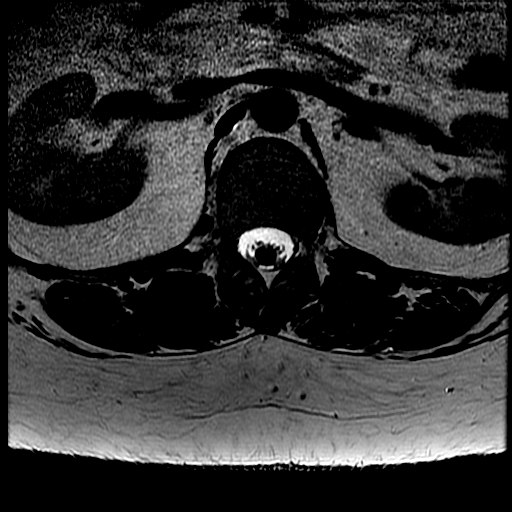

[Series 8: T1 · axial · 4.0mm · 0.39mm/px · z∈[-114,+72]mm · 3 of 40 slices shown (2 of 2)]
[im 5/40]
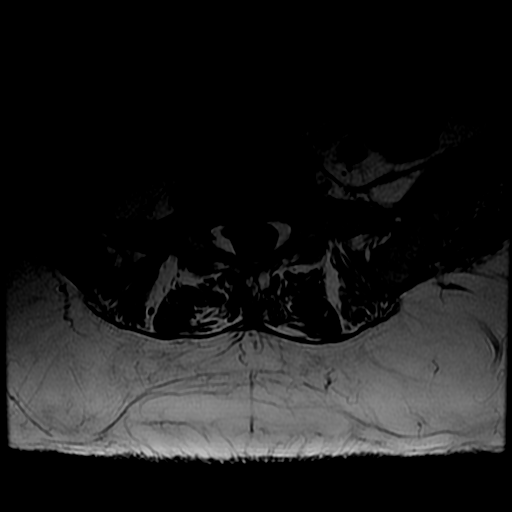
[im 20/40]
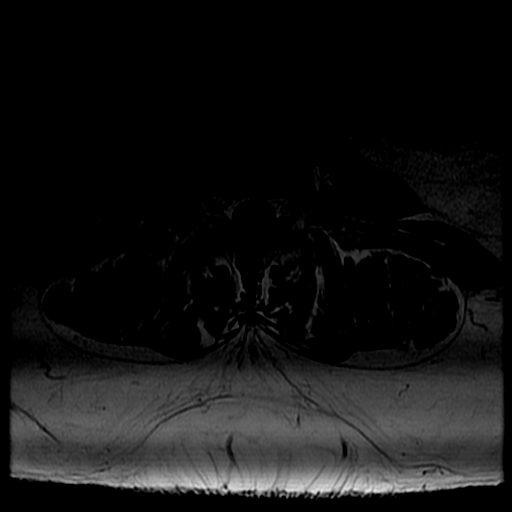
[im 35/40]
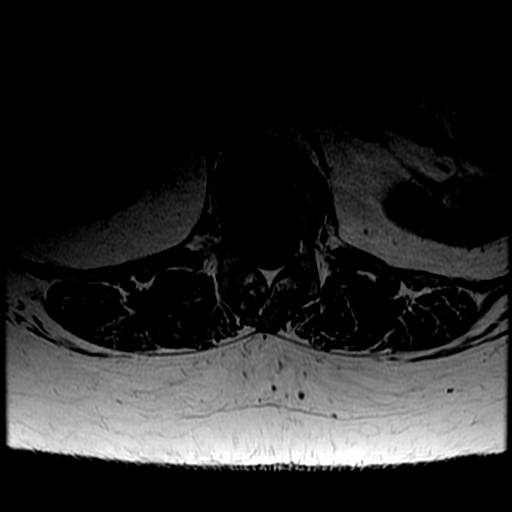

[19 of 48 positions shown; findings below may reference images not displayed]

FINDINGS: Five lumbar type vertebral bodies demonstrated on the comparison,
with the lowest full size disc space L5-S1. There is a vestigial
S1-S2 disc space, but the S1 level otherwise is sacralized. Full
size ribs at T12 by this numbering system. Correlation with
radiographs is recommended prior to any operative intervention.

Stable vertebral height and alignment. Trace anterolisthesis at
L5-S1. No marrow edema or evidence of acute osseous abnormality.

Visualized lower thoracic spinal cord is normal with conus medularis
at L1-L2.

Visualized abdominal viscera and paraspinal soft tissues are within
normal limits.

T10-T11:  Negative.

T11-T12:  Negative.

T12-L1:  Negative.

L1-L2:  Negative.

L2-L3:  Negative except for mild facet hypertrophy.

L3-L4:  Minimal disc bulge.  Mild facet hypertrophy.  No stenosis.

L4-L5: Negative disc. Moderate facet hypertrophy. Mild ligament
flavum hypertrophy. No stenosis.

L5-S1: Disc desiccation and mild disc space loss. Mild disc bulge.
Mild facet hypertrophy. No spinal or lateral recess stenosis. No
significant foraminal stenosis.
IMPRESSION: 1. Mildly transitional lumbosacral anatomy, the lowest full size
disc space is L5-S1. There is a vestigial S1-S2 disc, but the S1
level is sacralized.
2. Isolated disc degeneration at L5-S1 were there is also chronic
trace anterolisthesis. No subsequent spinal or foraminal stenosis.
3. Mild to moderate lower lumbar facet hypertrophy.

## 2016-10-12 ENCOUNTER — Other Ambulatory Visit: Payer: Self-pay | Admitting: Gastroenterology

## 2016-10-15 ENCOUNTER — Other Ambulatory Visit: Payer: Self-pay | Admitting: Gastroenterology

## 2016-10-21 ENCOUNTER — Encounter: Payer: Self-pay | Admitting: Internal Medicine

## 2016-10-21 ENCOUNTER — Ambulatory Visit (INDEPENDENT_AMBULATORY_CARE_PROVIDER_SITE_OTHER): Payer: Medicaid Other | Admitting: Internal Medicine

## 2016-10-21 VITALS — BP 100/62 | HR 84 | Ht 63.0 in | Wt 249.6 lb

## 2016-10-21 DIAGNOSIS — K2100 Gastro-esophageal reflux disease with esophagitis, without bleeding: Secondary | ICD-10-CM

## 2016-10-21 DIAGNOSIS — R112 Nausea with vomiting, unspecified: Secondary | ICD-10-CM

## 2016-10-21 DIAGNOSIS — R1013 Epigastric pain: Secondary | ICD-10-CM

## 2016-10-21 DIAGNOSIS — Z8719 Personal history of other diseases of the digestive system: Secondary | ICD-10-CM

## 2016-10-21 MED ORDER — PROMETHAZINE HCL 25 MG PO TABS: 25.0000 mg | ORAL_TABLET | Freq: Every day | ORAL | 6 refills | Status: DC | PRN

## 2016-10-21 NOTE — Progress Notes (Signed)
HISTORY OF PRESENT ILLNESS:  Mary [name] is a 41 y.o. female with multiple significant medical problems including morbid obesity, reported lupus followed in [place] on steroids, history of MS, GERD, history of pancreatitis with chronic stable lesion of the tail evaluated here and at [facility], chronic pain syndrome on chronic oxycodone, chronic functional abdominal complaints, and significant anxiety/depression. She is accompanied today by her mother to follow-up regarding problems with intermittent nausea associated with anorexia. She was last seen in this office by the GI physician assistant 06/09/2016. See that dictation. Complaints at that time were recurrent epigastric pain with nausea and vomiting. Laboratories were unremarkable. She was to have CT scan that did not follow through initially. Told to follow-up with me. Prescribe Phenergan which helps. She did present to the emergency room 08/23/2016. Reviewed. Complaints at that time were abdominal pain. Laboratories were unremarkable except for mild leukocytosis. CT of the abdomen and pelvis revealed no acute abnormalities. Lesion of the distal body/tail the pancreas was stable. She does stay on pantoprazole regular. This controls GERD. She also takes pancreatic enzymes. Her bowel habits are regular. She has gained 15 pounds since her last visit. Had been using Phenergan one time daily approximately 4-5 times per week. Zofran does not work and gets her migraine headaches. She does have significant anxiety. She states she seems to be doing better at this time.  REVIEW OF SYSTEMS:  All non-GI ROS negative except for joint aches  Past Medical History:  Diagnosis Date  . Abdominal hernia   . Adrenal insufficiency (HCC) 04/23/2013   ??  Marland Kitchen Anxiety   . Depression    "recent breakup with partner of 15 yrs"  . Depression   . GERD (gastroesophageal reflux disease)   . Hernia 03-24-13   ventral hernia remains   . MS (multiple sclerosis) (HCC)    . Pancreatic cyst 2002 onset  . Pancreatitis 03-24-13   2002, 2'2013, 03-14-13  . Ventral hernia     Past Surgical History:  Procedure Laterality Date  . ABDOMINAL SURGERY  ~ 2007   some sort of pancreatic cyst drainage.   . ABDOMINAL SURGERY    . ADENOIDECTOMY    . CHOLECYSTECTOMY     ~ age 76-laparoscopic  . CHOLECYSTECTOMY    . EUS N/A 04/14/2013   Procedure: UPPER ENDOSCOPIC ULTRASOUND (EUS) LINEAR;  Surgeon: Rachael Fee, MD;  Location: [facility];  Service: Endoscopy;  Laterality: N/A;  . ROUX-EN-Y GASTRIC BYPASS    . ROUX-EN-Y PROCEDURE     stent to pancreatic cyst that became infected within 36 hours    Social History Darrel Holtmeyer Watrous  reports that she quit smoking about 3 years ago. Her smoking use included Cigarettes. She has a 10.00 pack-year smoking history. She has never used smokeless tobacco. She reports that she does not drink alcohol or use drugs.  family history includes Colitis in her maternal aunt; Diabetes in her father and mother.  Allergies  Allergen Reactions  . Amoxicillin Anaphylaxis  . Latex Rash  . Morphine And Related Anaphylaxis    unknown  . Penicillins Anaphylaxis    Has patient had a PCN reaction causing immediate rash, facial/tongue/throat swelling, SOB or lightheadedness with hypotension: Yes Has patient had a PCN reaction causing severe rash involving mucus membranes or skin necrosis: No Has patient had a PCN reaction that required hospitalization Not sure  Has patient had a PCN reaction occurring within the last 10 years: Yes  If all of the above  answers are "NO", then may proceed with Cephalosporin use.   . Meat Extract     Does not eat meat--only fish   . Morphine And Related Nausea And Vomiting  . Buprenorphine Hcl Nausea And Vomiting       PHYSICAL EXAMINATION: Vital signs: BP 100/62   Pulse 84   Ht 5\' 3"  (1.6 m)   Wt 249 lb 9.6 oz (113.2 kg)   LMP 09/24/2016 (Approximate)   BMI 44.21 kg/m   Constitutional: Pleasant,  obese, generally well-appearing, no acute distress. Alopecia Psychiatric: alert and oriented x3, cooperative Eyes: extraocular movements intact, anicteric, conjunctiva pink Mouth: oral pharynx moist, no lesions. No thrush Neck: supple no lymphadenopathy Cardiovascular: heart regular rate and rhythm, no murmur Lungs: clear to auscultation bilaterally Abdomen: soft,Obese, tenderness with superficial palpation on the abdominal wall, nondistended, no obvious ascites, no peritoneal signs, normal bowel sounds, no organomegaly Rectal:Omitted Extremities: no clubbing cyanosis or lower extremity edema bilaterally Skin: no lesions on visible extremities Neuro: No focal deficits. Cranial nerves intact  ASSESSMENT:  #1. Chronic intermittent nausea. Previous EGD to evaluate same 2 years ago was negative. Suspect secondary to chronic narcotics. May be functional as well. We discussed this. She does respond to infrequent use of Phenergan. She wishes to continue to have this on hand if needed. She wishes to have our office prescribed this. #2. History of pancreatitis. Chronic stable lesion of the tail. No obvious active symptoms. Takes pancreatic enzymes #3. GERD. Ongoing but controlled with PPI  PLAN:   1. Discussion of potential causes for nausea 2. Reflux precautions with attention to weight loss 3. Continue PPI 4. Prescribe Phenergan 25 mg once daily as needed for severe nausea only 5. Routine follow-up one year 6. Return to care PCP and other specialists  25 minutes was spent face-to-face with the patient. Greater than 50% a time use for counseling regarding her chronic nausea and chronic GI problems

## 2016-10-21 NOTE — Patient Instructions (Signed)
We have sent the following medications to your pharmacy for you to pick up at your convenience:  Phenergan.  Take one a day as needed for severe nausea.

## 2016-11-12 ENCOUNTER — Encounter: Payer: Medicaid Other | Admitting: Physical Medicine & Rehabilitation

## 2016-11-19 ENCOUNTER — Other Ambulatory Visit: Payer: Self-pay | Admitting: *Deleted

## 2016-11-19 DIAGNOSIS — R531 Weakness: Secondary | ICD-10-CM

## 2016-11-19 DIAGNOSIS — G35 Multiple sclerosis: Secondary | ICD-10-CM

## 2016-11-19 DIAGNOSIS — M329 Systemic lupus erythematosus, unspecified: Secondary | ICD-10-CM

## 2016-11-19 MED ORDER — METHOCARBAMOL 500 MG PO TABS: 500.0000 mg | ORAL_TABLET | Freq: Two times a day (BID) | ORAL | 1 refills | Status: DC

## 2016-11-21 ENCOUNTER — Encounter: Payer: Medicaid Other | Admitting: Registered Nurse

## 2016-11-26 ENCOUNTER — Ambulatory Visit: Payer: Self-pay | Admitting: Physical Medicine & Rehabilitation

## 2016-12-17 ENCOUNTER — Encounter: Payer: Self-pay | Admitting: Physical Medicine & Rehabilitation

## 2016-12-17 ENCOUNTER — Encounter: Payer: Medicaid Other | Attending: Physical Medicine & Rehabilitation | Admitting: Physical Medicine & Rehabilitation

## 2016-12-17 VITALS — BP 98/68 | HR 82

## 2016-12-17 DIAGNOSIS — K861 Other chronic pancreatitis: Secondary | ICD-10-CM | POA: Diagnosis not present

## 2016-12-17 DIAGNOSIS — M329 Systemic lupus erythematosus, unspecified: Secondary | ICD-10-CM

## 2016-12-17 DIAGNOSIS — Z5181 Encounter for therapeutic drug level monitoring: Secondary | ICD-10-CM | POA: Diagnosis not present

## 2016-12-17 DIAGNOSIS — G43719 Chronic migraine without aura, intractable, without status migrainosus: Secondary | ICD-10-CM

## 2016-12-17 DIAGNOSIS — Z79899 Other long term (current) drug therapy: Secondary | ICD-10-CM

## 2016-12-17 DIAGNOSIS — R531 Weakness: Secondary | ICD-10-CM

## 2016-12-17 DIAGNOSIS — G35 Multiple sclerosis: Secondary | ICD-10-CM

## 2016-12-17 DIAGNOSIS — G894 Chronic pain syndrome: Secondary | ICD-10-CM

## 2016-12-17 DIAGNOSIS — M5416 Radiculopathy, lumbar region: Secondary | ICD-10-CM

## 2016-12-17 DIAGNOSIS — M545 Low back pain: Secondary | ICD-10-CM | POA: Insufficient documentation

## 2016-12-17 MED ORDER — OXYCODONE HCL 10 MG PO TABS: 10.0000 mg | ORAL_TABLET | Freq: Four times a day (QID) | ORAL | 0 refills | Status: DC | PRN

## 2016-12-17 NOTE — Progress Notes (Signed)
Subjective:    Patient ID: Mary [name], female    DOB: Nov 14, 1975, 41 y.o.   MRN: [id number]  HPI   Mary [name] is here in follow up of her chronic pain and gait disorder. She is still having intermittent nausea with meds and food. Phenergan seems to help with symptoms. She is having BM's daily.   Her headaches are overall improved with the increased topamax. She still has the very sharp headaches 3 x per week which are treatable with the PO imitrex. Afterhwich she falls asleep and the headaches are usually resolved  She continues on prednisone which has been around 5mg  daily for the most part. She has some intermittent issues with balance but hasn't fallen. Her memory seems to have gotten worse.     Pain Inventory Average Pain 7 Pain Right Now 5 My pain is sharp, burning, dull, stabbing, tingling and aching  In the last 24 hours, has pain interfered with the following? General activity 10 Relation with others 8 Enjoyment of life 10 What TIME of day is your pain at its worst? evening Sleep (in general) Fair  Pain is worse with: walking, bending, standing and some activites Pain improves with: medication Relief from Meds: 9  Mobility use a cane do you drive?  no transfers alone  Function disabled: date disabled . I need assistance with the following:  meal prep, household duties and shopping  Neuro/Psych weakness numbness tingling trouble walking spasms confusion depression anxiety  Prior Studies Any changes since last visit?  no  Physicians involved in your care Any changes since last visit?  no   Family History  Problem Relation Age of Onset  . Diabetes Mother   . Diabetes Father   . Colitis Maternal Aunt   . Diabetes      many family members  . Lupus Neg Hx   . Sarcoidosis Neg Hx   . Pancreatitis Neg Hx   . Ataxia Neg Hx   . Chorea Neg Hx   . Dementia Neg Hx   . Mental retardation Neg Hx   . Migraines Neg Hx   . Multiple sclerosis Neg Hx   .  Neurofibromatosis Neg Hx   . Neuropathy Neg Hx   . Parkinsonism Neg Hx   . Seizures Neg Hx   . Stroke Neg Hx   . Colon cancer Neg Hx    Social History   Social History  . Marital status: Single    Spouse name: N/A  . Number of children: 0  . Years of education: N/A   Social History Main Topics  . Smoking status: Former Smoker    Packs/day: 0.50    Years: 20.00    Types: Cigarettes    Quit date: 05/22/2013  . Smokeless tobacco: Never Used  . Alcohol use No  . Drug use: No  . Sexual activity: No   Other Topics Concern  . None   Social History Narrative   ** Merged History Encounter **       Lives permanently in [place] with mom and stepfather.  Has not started working yet and was unemployed in [place].            Past Surgical History:  Procedure Laterality Date  . ABDOMINAL SURGERY  ~ 2007   some sort of pancreatic cyst drainage.   . ABDOMINAL SURGERY    . ADENOIDECTOMY    . CHOLECYSTECTOMY     ~ age 20-laparoscopic  . CHOLECYSTECTOMY    . EUS  N/A 04/14/2013   Procedure: UPPER ENDOSCOPIC ULTRASOUND (EUS) LINEAR;  Surgeon: Rachael Fee, MD;  Location: [facility];  Service: Endoscopy;  Laterality: N/A;  . ROUX-EN-Y GASTRIC BYPASS    . ROUX-EN-Y PROCEDURE     stent to pancreatic cyst that became infected within 36 hours   Past Medical History:  Diagnosis Date  . Abdominal hernia   . Adrenal insufficiency (HCC) 04/23/2013   ??  Marland Kitchen Anxiety   . Depression    "recent breakup with partner of 15 yrs"  . Depression   . GERD (gastroesophageal reflux disease)   . Hernia 03-24-13   ventral hernia remains   . MS (multiple sclerosis) (HCC)   . Pancreatic cyst 2002 onset  . Pancreatitis 03-24-13   2002, 2'2013, 03-14-13  . Ventral hernia    BP 98/68   Pulse 82   SpO2 97%   Opioid Risk Score:   Fall Risk Score:  `1  Depression screen PHQ 2/9  Depression screen [facility] 2/9 04/09/2016 10/01/2015 05/21/2015 11/01/2014  Decreased Interest 2 2 2 2   Down, Depressed, Hopeless  2 2 - 2  PHQ - 2 Score 4 4 2 4   Altered sleeping - - - 1  Tired, decreased energy - - - 1  Change in appetite - - - 3  Feeling bad or failure about yourself  - - - 0  Trouble concentrating - - - 1  Moving slowly or fidgety/restless - - - 0  PHQ-9 Score - - - 10    Review of Systems  Constitutional: Positive for appetite change and unexpected weight change.  HENT: Negative.   Eyes: Negative.   Respiratory: Negative.   Cardiovascular: Negative.   Gastrointestinal: Positive for abdominal pain and nausea.  Endocrine: Negative.   Genitourinary: Negative.   Musculoskeletal: Negative.   Skin: Negative.   Allergic/Immunologic: Negative.   Neurological: Negative.   Hematological: Negative.   Psychiatric/Behavioral: Negative.   All other systems reviewed and are negative.      Objective:   Physical Exam  General: Alert and oriented x 3, No apparent distress. obese  HEENT:Head is normocephalic, atraumatic, PERRLA, EOMI, sclera anicteric, oral mucosa pink and moist, dentition intact, ext ear canals clear,  Neck:Supple without JVD or lymphadenopathy  Heart:RRR  Chest:CTA B Abdomen:NT.  Extremities:No clubbing, cyanosis, or edema. Pulses are 2+  Skin:Clean and intact without signs of breakdown  Neuro:strength inconsistent in LE. Tends to be weaker on right. Balance off with loss of balance to left today.  Musculoskeletal: has lumbar and cervical pain with flexion and extension today. Memory inconsistent. Attention fair Psych:Pt's affect is flat    Assessment & Plan:  1. Non-specific bicerebral/brainstem white matter disease---initially suspected to be MS, but not characteristic in presentation. LikelySLE.              -continued memory and balance deficits.  2. Chronic pain syndrome related to above with primarily neuropathic right sided pain. She has had a recent increase in cervical pain and related headaches over the last several months as well.  3. Low back pain  with ? Radiculopathy RLE  4. Chronic pancreatitis  5. Conversion disorder     Plan:  1. Increased topamax 100mg  am and 100mg  qpm for headaches and neuropathic pain.              -imitrex 50mg  for breakthrough migraine headaches  -current regimen fairly effective  -discussed botox as an option 2. Continue psychiatry follow up. Many of her neurological symptoms  are likely non-organic in nature given her recent spontaneous improvement and relapse. Pt/family aware -psychology evaluated patient  -would benefit from regular psych counseling 3. Oxycodone was refilled. #120 10mg  was refilled today. Second rx for next month We will continue the opioid monitoring program, this consists of regular clinic visits, examinations, urine drug screen, pill counts as well as use of [place] Controlled Substance Reporting System. [place] today.  4. TENS unit prn.  7. Rheumatology follow up as directed.  8. Follow up with NP in about 2 months with [name]. 30 minutes were spent during this visit. All questions were encouraged and answered.  Marland Kitchen

## 2016-12-17 NOTE — Patient Instructions (Signed)
PLEASE FEEL FREE TO CALL OUR OFFICE WITH ANY PROBLEMS OR QUESTIONS ([phone])      

## 2016-12-22 LAB — TOXASSURE SELECT,+ANTIDEPR,UR

## 2016-12-23 ENCOUNTER — Telehealth: Payer: Self-pay | Admitting: *Deleted

## 2016-12-23 NOTE — Telephone Encounter (Signed)
Urine drug screen for this encounter is consistent for prescribed medication 

## 2017-01-17 ENCOUNTER — Other Ambulatory Visit: Payer: Self-pay | Admitting: Physical Medicine & Rehabilitation

## 2017-01-17 DIAGNOSIS — M329 Systemic lupus erythematosus, unspecified: Secondary | ICD-10-CM

## 2017-01-17 DIAGNOSIS — G35 Multiple sclerosis: Secondary | ICD-10-CM

## 2017-01-17 DIAGNOSIS — R531 Weakness: Secondary | ICD-10-CM

## 2017-02-17 ENCOUNTER — Encounter: Payer: Medicaid Other | Attending: Physical Medicine & Rehabilitation | Admitting: Physical Medicine & Rehabilitation

## 2017-02-17 ENCOUNTER — Encounter: Payer: Self-pay | Admitting: Physical Medicine & Rehabilitation

## 2017-02-17 VITALS — BP 125/89 | HR 79 | Resp 14

## 2017-02-17 DIAGNOSIS — M329 Systemic lupus erythematosus, unspecified: Secondary | ICD-10-CM | POA: Diagnosis not present

## 2017-02-17 DIAGNOSIS — M5416 Radiculopathy, lumbar region: Secondary | ICD-10-CM

## 2017-02-17 DIAGNOSIS — K861 Other chronic pancreatitis: Secondary | ICD-10-CM | POA: Diagnosis not present

## 2017-02-17 DIAGNOSIS — G43719 Chronic migraine without aura, intractable, without status migrainosus: Secondary | ICD-10-CM

## 2017-02-17 DIAGNOSIS — R531 Weakness: Secondary | ICD-10-CM

## 2017-02-17 DIAGNOSIS — Z79899 Other long term (current) drug therapy: Secondary | ICD-10-CM | POA: Diagnosis not present

## 2017-02-17 DIAGNOSIS — G894 Chronic pain syndrome: Secondary | ICD-10-CM | POA: Diagnosis not present

## 2017-02-17 DIAGNOSIS — G35 Multiple sclerosis: Secondary | ICD-10-CM | POA: Insufficient documentation

## 2017-02-17 DIAGNOSIS — M545 Low back pain: Secondary | ICD-10-CM | POA: Insufficient documentation

## 2017-02-17 DIAGNOSIS — Z5181 Encounter for therapeutic drug level monitoring: Secondary | ICD-10-CM

## 2017-02-17 MED ORDER — OXYCODONE HCL 10 MG PO TABS: 10.0000 mg | ORAL_TABLET | Freq: Four times a day (QID) | ORAL | 0 refills | Status: DC | PRN

## 2017-02-17 MED ORDER — METHOCARBAMOL 500 MG PO TABS: 500.0000 mg | ORAL_TABLET | Freq: Two times a day (BID) | ORAL | 0 refills | Status: DC

## 2017-02-17 MED ORDER — TOPIRAMATE 100 MG PO TABS: ORAL_TABLET | ORAL | 4 refills | Status: DC

## 2017-02-17 MED ORDER — BACLOFEN 10 MG PO TABS: 10.0000 mg | ORAL_TABLET | Freq: Every evening | ORAL | 3 refills | Status: DC | PRN

## 2017-02-17 NOTE — Patient Instructions (Signed)

## 2017-02-17 NOTE — Progress Notes (Signed)
Subjective:    Patient ID: Mary [name], female    DOB: 05-15-1976, 41 y.o.   MRN: [id number]  HPI  Mary [name] is here in follow up of her chronic pain. She states that the heat "flares" her symptoms and can make her become "unresponsive". She had two episodes last month while she was outside and things happened.   She has had pain in the posterior right hip, more so over the last two months. She has found it more painful to walk and bear weight on the right leg.   She is doing some walking but not stretching so much. She is taking her medications as prescribed. She did not receive the TENS unit yet.  She reports spasms in both legs at night which keep her awake.   Pain Inventory Average Pain 8 Pain Right Now 8 My pain is constant, sharp, stabbing, tingling and aching  In the last 24 hours, has pain interfered with the following? General activity 7 Relation with others 3 Enjoyment of life 5 What TIME of day is your pain at its worst? morning, evening Sleep (in general) Poor  Pain is worse with: walking, bending and standing Pain improves with: medication Relief from Meds: 6  Mobility how many minutes can you walk? 10 Do you have any goals in this area?  no  Function disabled: date disabled . I need assistance with the following:  household duties and shopping  Neuro/Psych weakness numbness tingling confusion depression anxiety  Prior Studies Any changes since last visit?  no  Physicians involved in your care Any changes since last visit?  no   Family History  Problem Relation Age of Onset  . Diabetes Mother   . Diabetes Father   . Colitis Maternal Aunt   . Diabetes Unknown        many family members  . Lupus Neg Hx   . Sarcoidosis Neg Hx   . Pancreatitis Neg Hx   . Ataxia Neg Hx   . Chorea Neg Hx   . Dementia Neg Hx   . Mental retardation Neg Hx   . Migraines Neg Hx   . Multiple sclerosis Neg Hx   . Neurofibromatosis Neg Hx   . Neuropathy Neg Hx     . Parkinsonism Neg Hx   . Seizures Neg Hx   . Stroke Neg Hx   . Colon cancer Neg Hx    Social History   Social History  . Marital status: Single    Spouse name: N/A  . Number of children: 0  . Years of education: N/A   Social History Main Topics  . Smoking status: Former Smoker    Packs/day: 0.50    Years: 20.00    Types: Cigarettes    Quit date: 05/22/2013  . Smokeless tobacco: Never Used  . Alcohol use No  . Drug use: No  . Sexual activity: No   Other Topics Concern  . None   Social History Narrative   ** Merged History Encounter **       Lives permanently in [place] with mom and stepfather.  Has not started working yet and was unemployed in [place].            Past Surgical History:  Procedure Laterality Date  . ABDOMINAL SURGERY  ~ 2007   some sort of pancreatic cyst drainage.   . ABDOMINAL SURGERY    . ADENOIDECTOMY    . CHOLECYSTECTOMY     ~ age 71-laparoscopic  .  CHOLECYSTECTOMY    . EUS N/A 04/14/2013   Procedure: UPPER ENDOSCOPIC ULTRASOUND (EUS) LINEAR;  Surgeon: Rachael Fee, MD;  Location: [facility];  Service: Endoscopy;  Laterality: N/A;  . ROUX-EN-Y GASTRIC BYPASS    . ROUX-EN-Y PROCEDURE     stent to pancreatic cyst that became infected within 36 hours   Past Medical History:  Diagnosis Date  . Abdominal hernia   . Adrenal insufficiency (HCC) 04/23/2013   ??  Marland Kitchen Anxiety   . Depression    "recent breakup with partner of 15 yrs"  . Depression   . GERD (gastroesophageal reflux disease)   . Hernia 03-24-13   ventral hernia remains   . MS (multiple sclerosis) (HCC)   . Pancreatic cyst 2002 onset  . Pancreatitis 03-24-13   2002, 2'2013, 03-14-13  . Ventral hernia    BP 125/89 (BP Location: Right Wrist, Patient Position: Sitting, Cuff Size: Normal)   Pulse 79   Resp 14   SpO2 98%   Opioid Risk Score:   Fall Risk Score:  `1  Depression screen PHQ 2/9  Depression screen [facility] 2/9 04/09/2016 10/01/2015 05/21/2015 11/01/2014  Decreased Interest  2 2 2 2   Down, Depressed, Hopeless 2 2 - 2  PHQ - 2 Score 4 4 2 4   Altered sleeping - - - 1  Tired, decreased energy - - - 1  Change in appetite - - - 3  Feeling bad or failure about yourself  - - - 0  Trouble concentrating - - - 1  Moving slowly or fidgety/restless - - - 0  PHQ-9 Score - - - 10    Review of Systems  HENT: Negative.   Eyes: Negative.   Respiratory: Negative.   Cardiovascular: Negative.   Gastrointestinal: Positive for nausea and vomiting.  Endocrine: Negative.   Genitourinary: Negative.   Musculoskeletal: Positive for arthralgias, back pain, myalgias and neck pain.  Skin: Negative.   Allergic/Immunologic: Negative.   Neurological: Positive for weakness, numbness and headaches.       Tingling   Psychiatric/Behavioral: Positive for confusion and dysphoric mood. The patient is nervous/anxious.   All other systems reviewed and are negative.      Objective:   Physical Exam General: Alert and oriented x 3, No apparent distress. obese  HEENT:Head is normocephalic, atraumatic, PERRLA, EOMI, sclera anicteric, oral mucosa pink and moist, dentition intact, ext ear canals clear,  Neck:Supple RRR without murmur. No JVD   Chest:normal effort.  Abdomen:NT.  Extremities:No clubbing, cyanosis, or edema. Pulses are 2+  Skin:Clean and intact without signs of breakdown  Neuro:right sided weakness better Musculoskeletal: has lumbar and cervical pain with flexion and extension today.. Hamstrings tight. Mild greater troch tenderness.antalgic on right LE. Pain to palp right upper glute/ +/- greater troch Psych:Pt's affect is more dynamic   Assessment & Plan:  1. Non-specific bicerebral/brainstem white matter disease---initially suspected to be MS, but not characteristic in presentation. LikelySLE.  -continued memory and balance deficits.  2. Chronic pain syndrome related to above with primarily neuropathic right sided pain. She has had a recent  increase in cervical pain and related headaches over the last several months as well.  3. Low back pain with ? Radiculopathy RLE  4. Chronic pancreatitis  5. Conversion disorder     Plan:  1. Increasedtopamax 100mg  am and 100mg  qpm for headaches and neuropathic pain.  -imitrex 50mg  for breakthrough migraine headaches             -current regimen fairly  effective             -botox is an option.  2. Continue psychiatry follow up. Many of her neurological symptoms are likely non-organic in nature given her recent spontaneous improvement and relapse. Pt/family aware -psychology evaluated patient             -follow up? 3. Oxycodone was refilled. #120 10mg  was refilled today. Second rx for next month We will continue the opioid monitoring program, this consists of regular clinic visits, examinations, urine drug screen, pill counts as well as use of [place] Controlled Substance Reporting System. [place] today.  4. will add low dose baclofen at night for sleep. ?spasms. May wean off robaxin ultimately 5. Pt was given greater troch exercises as well as for facet arthropathy.  -ice to right hip 7. Rheumatology follow up as directed.  8. Follow up with NP in about 2 months with [name]. 25 minutes were spent during this visit. All questions were encouraged and answered.  Marland Kitchen

## 2017-05-13 ENCOUNTER — Encounter: Payer: Medicaid Other | Attending: Physical Medicine & Rehabilitation | Admitting: Physical Medicine & Rehabilitation

## 2017-05-13 ENCOUNTER — Encounter: Payer: Self-pay | Admitting: Physical Medicine & Rehabilitation

## 2017-05-13 VITALS — BP 97/63 | HR 65

## 2017-05-13 DIAGNOSIS — M545 Low back pain: Secondary | ICD-10-CM | POA: Diagnosis not present

## 2017-05-13 DIAGNOSIS — G894 Chronic pain syndrome: Secondary | ICD-10-CM | POA: Diagnosis not present

## 2017-05-13 DIAGNOSIS — R531 Weakness: Secondary | ICD-10-CM

## 2017-05-13 DIAGNOSIS — M5416 Radiculopathy, lumbar region: Secondary | ICD-10-CM

## 2017-05-13 DIAGNOSIS — K861 Other chronic pancreatitis: Secondary | ICD-10-CM | POA: Diagnosis not present

## 2017-05-13 DIAGNOSIS — Z5181 Encounter for therapeutic drug level monitoring: Secondary | ICD-10-CM

## 2017-05-13 DIAGNOSIS — G43719 Chronic migraine without aura, intractable, without status migrainosus: Secondary | ICD-10-CM

## 2017-05-13 DIAGNOSIS — M329 Systemic lupus erythematosus, unspecified: Secondary | ICD-10-CM | POA: Diagnosis not present

## 2017-05-13 DIAGNOSIS — Z79899 Other long term (current) drug therapy: Secondary | ICD-10-CM

## 2017-05-13 DIAGNOSIS — G35 Multiple sclerosis: Secondary | ICD-10-CM

## 2017-05-13 MED ORDER — OXYCODONE HCL 10 MG PO TABS: 10.0000 mg | ORAL_TABLET | Freq: Four times a day (QID) | ORAL | 0 refills | Status: DC | PRN

## 2017-05-13 MED ORDER — SUMATRIPTAN SUCCINATE 50 MG PO TABS: 50.0000 mg | ORAL_TABLET | ORAL | 3 refills | Status: DC | PRN

## 2017-05-13 NOTE — Patient Instructions (Addendum)
PLEASE FEEL FREE TO CALL OUR OFFICE WITH ANY PROBLEMS OR QUESTIONS [phone])    THINK ABOUT SAFETY AT HOME!!!!!!!   YOU NEED YOUR WALKER UNTIL YOUR RIGHT LEG IS STRONGER.

## 2017-05-13 NOTE — Progress Notes (Signed)
Subjective:    Patient ID: Mary [name], female    DOB: [birth year], 41 y.o.   MRN: [id number]  HPI  Mary [name] is here in follow up of her gait disorder and chronic pain. She has had a "flare" of her symptoms with increased right sided weakness after caring for a friend who was injured. She also has fallen four times, including once when she tried to push a car out of the mud.  She has been weaker for the last 3 weeks and was placed back on steroid which has helped to a certain extent.   She ran out of her pain medications and mom had to stretch her (her appt was made to far out). She has been more sore because of this in addition to the falls she's had.  .   Low back and head continue to be her main areas of pain. She is using imitrex and topamax currently for her headaches.     Pain Inventory Average Pain 8 Pain Right Now 8 My pain is constant, sharp, burning, stabbing and tingling  In the last 24 hours, has pain interfered with the following? General activity na Relation with others na Enjoyment of life na What TIME of day is your pain at its worst? morning evening and night Sleep (in general) Poor  Pain is worse with: walking and standing Pain improves with: medication Relief from Meds: 8  Mobility use a cane use a walker ability to climb steps?  no do you drive?  yes needs help with transfers  Function disabled: date disabled .  Neuro/Psych weakness numbness tingling trouble walking dizziness confusion depression anxiety  Prior Studies Any changes since last visit?  no  Physicians involved in your care Any changes since last visit?  no   Family History  Problem Relation Age of Onset  . Diabetes Mother   . Diabetes Father   . Colitis Maternal Aunt   . Diabetes Unknown        many family members  . Lupus Neg Hx   . Sarcoidosis Neg Hx   . Pancreatitis Neg Hx   . Ataxia Neg Hx   . Chorea Neg Hx   . Dementia Neg Hx   . Mental retardation Neg Hx     . Migraines Neg Hx   . Multiple sclerosis Neg Hx   . Neurofibromatosis Neg Hx   . Neuropathy Neg Hx   . Parkinsonism Neg Hx   . Seizures Neg Hx   . Stroke Neg Hx   . Colon cancer Neg Hx    Social History   Social History  . Marital status: Single    Spouse name: N/A  . Number of children: 0  . Years of education: N/A   Social History Main Topics  . Smoking status: Former Smoker    Packs/day: 0.50    Years: 20.00    Types: Cigarettes    Quit date: 05/22/2013  . Smokeless tobacco: Never Used  . Alcohol use No  . Drug use: No  . Sexual activity: No   Other Topics Concern  . None   Social History Narrative   ** Merged History Encounter **       Lives permanently in [place] with mom and stepfather.  Has not started working yet and was unemployed in [place].            Past Surgical History:  Procedure Laterality Date  . ABDOMINAL SURGERY  ~ 2007   some sort  of pancreatic cyst drainage.   . ABDOMINAL SURGERY    . ADENOIDECTOMY    . CHOLECYSTECTOMY     ~ age 62-laparoscopic  . CHOLECYSTECTOMY    . EUS N/A 04/14/2013   Procedure: UPPER ENDOSCOPIC ULTRASOUND (EUS) LINEAR;  Surgeon: Rachael Fee, MD;  Location: [facility];  Service: Endoscopy;  Laterality: N/A;  . ROUX-EN-Y GASTRIC BYPASS    . ROUX-EN-Y PROCEDURE     stent to pancreatic cyst that became infected within 36 hours   Past Medical History:  Diagnosis Date  . Abdominal hernia   . Adrenal insufficiency (HCC) 04/23/2013   ??  Marland Kitchen Anxiety   . Depression    "recent breakup with partner of 15 yrs"  . Depression   . GERD (gastroesophageal reflux disease)   . Hernia 03-24-13   ventral hernia remains   . MS (multiple sclerosis) (HCC)   . Pancreatic cyst 2002 onset  . Pancreatitis 03-24-13   2002, 2'2013, 03-14-13  . Ventral hernia    BP 97/63   Pulse 65   SpO2 98%   Opioid Risk Score:  2 Fall Risk Score:  `1  Depression screen PHQ 2/9  Depression screen [facility] 2/9 05/13/2017 04/09/2016 10/01/2015 05/21/2015  11/01/2014  Decreased Interest Down, Depressed, Hopeless - 2  PHQ - 2 Score Altered sleeping - - - - 1  Tired, decreased energy - - - - 1  Change in appetite - - - - 3  Feeling bad or failure about yourself  - - - - 0  Trouble concentrating - - - - 1  Moving slowly or fidgety/restless - - - - 0  PHQ-9 Score - - - - 10    Review of Systems  HENT: Negative.   Eyes: Negative.   Respiratory: Negative.   Cardiovascular: Positive for leg swelling.  Gastrointestinal: Positive for nausea.  Endocrine: Negative.   Genitourinary: Negative.   Musculoskeletal: Positive for gait problem.  Skin: Negative.   Neurological: Positive for dizziness, weakness and numbness.       Tingling  Psychiatric/Behavioral: Positive for confusion and dysphoric mood. The patient is nervous/anxious.   All other systems reviewed and are negative.      Objective:   Physical Exam  General: Alert and oriented x 3, No apparent distress. obese  HEENT:Head is normocephalic, atraumatic, PERRLA, EOMI, sclera anicteric, oral mucosa pink and moist, dentition intact, ext ear canals clear,  Neck:Supple without JVD or lymphadenopathy  Heart:RRR  Chest:CTA B Abdomen:NT.  Extremities:No clubbing, cyanosis, or edema. Pulses are 2+  Skin:Clean and intact without signs of breakdown  Neuro:strength inconsistent in LE. Rue 4/5. LUE 1-2/5 with inconsistent effort. Apraxic. Drags right foot with afo in place. afo fitting appropriately. Using walker. delayed processing. Alert. Speech clear. Musculoskeletal: lumbar spine TTP.. Memory inconsistent.   Psych:Pt's affect is flat       Assessment & Plan:  1. Non-specific bicerebral/brainstem white matter disease---initially suspected to be MS, but not characteristic in presentation. LikelySLE.  -continued memory and balance deficits.  2. Chronic pain syndrome related to above with primarily neuropathic right sided pain. She has  had a recent increase in cervical pain and related headaches over the last several months as well.  3. Low back pain with ? Radiculopathy RLE  4. Chronic pancreatitis  5. Conversion disorder     Plan:  1. Continue topamax  am and  qpm for headaches and neuropathic  pain.  -continue imitrex 50mg  for breakthrough migraine headaches---refilled today             -current regimen fairly effective             -  botox ? 2. Continue psychiatry follow up. Many of her neurological symptoms are likely non-organic in nature given her recent spontaneous improvement and relapse. Pt/family aware -psychology has seen pt  -family remains supportive 3. Oxycodone was refilled. #120 10mg  was refilled today.  Second rx given. We will continue the opioid monitoring program, this consists of regular clinic visits, examinations, routine drug screening, pill counts as well as use of [place] Controlled Substance Reporting System. [place] was reviewed today.   4. TENS unit prn.  7. Rheumatology follow up as directed.  8. Follow up with NP in about 2 months with [name]. 15 minutes were spent during this visit. All questions were encouraged and answered.  Marland Kitchen

## 2017-06-18 ENCOUNTER — Other Ambulatory Visit: Payer: Self-pay | Admitting: Gastroenterology

## 2017-06-18 ENCOUNTER — Other Ambulatory Visit: Payer: Self-pay | Admitting: Physical Medicine & Rehabilitation

## 2017-06-18 DIAGNOSIS — R531 Weakness: Secondary | ICD-10-CM

## 2017-06-18 DIAGNOSIS — G35 Multiple sclerosis: Secondary | ICD-10-CM

## 2017-06-18 DIAGNOSIS — M329 Systemic lupus erythematosus, unspecified: Secondary | ICD-10-CM

## 2017-07-13 ENCOUNTER — Encounter: Payer: Medicaid Other | Admitting: Physical Medicine & Rehabilitation

## 2017-07-16 ENCOUNTER — Other Ambulatory Visit: Payer: Self-pay | Admitting: Physical Medicine & Rehabilitation

## 2017-07-16 DIAGNOSIS — G35 Multiple sclerosis: Secondary | ICD-10-CM

## 2017-07-16 DIAGNOSIS — R531 Weakness: Secondary | ICD-10-CM

## 2017-07-16 DIAGNOSIS — M329 Systemic lupus erythematosus, unspecified: Secondary | ICD-10-CM

## 2017-08-05 ENCOUNTER — Encounter: Payer: Self-pay | Admitting: Physical Medicine & Rehabilitation

## 2017-08-05 ENCOUNTER — Encounter: Payer: Medicaid Other | Attending: Physical Medicine & Rehabilitation | Admitting: Physical Medicine & Rehabilitation

## 2017-08-05 VITALS — BP 109/70 | HR 84

## 2017-08-05 DIAGNOSIS — M329 Systemic lupus erythematosus, unspecified: Secondary | ICD-10-CM

## 2017-08-05 DIAGNOSIS — G894 Chronic pain syndrome: Secondary | ICD-10-CM | POA: Diagnosis not present

## 2017-08-05 DIAGNOSIS — G35 Multiple sclerosis: Secondary | ICD-10-CM | POA: Diagnosis not present

## 2017-08-05 DIAGNOSIS — K861 Other chronic pancreatitis: Secondary | ICD-10-CM | POA: Insufficient documentation

## 2017-08-05 DIAGNOSIS — M545 Low back pain: Secondary | ICD-10-CM | POA: Diagnosis not present

## 2017-08-05 DIAGNOSIS — R531 Weakness: Secondary | ICD-10-CM

## 2017-08-05 DIAGNOSIS — Z5181 Encounter for therapeutic drug level monitoring: Secondary | ICD-10-CM

## 2017-08-05 DIAGNOSIS — G43719 Chronic migraine without aura, intractable, without status migrainosus: Secondary | ICD-10-CM | POA: Diagnosis not present

## 2017-08-05 DIAGNOSIS — Z79899 Other long term (current) drug therapy: Secondary | ICD-10-CM | POA: Diagnosis not present

## 2017-08-05 MED ORDER — OXYCODONE HCL 10 MG PO TABS: 10.0000 mg | ORAL_TABLET | Freq: Four times a day (QID) | ORAL | 0 refills | Status: DC | PRN

## 2017-08-05 MED ORDER — SUMATRIPTAN SUCCINATE 100 MG PO TABS: 100.0000 mg | ORAL_TABLET | ORAL | 4 refills | Status: DC | PRN

## 2017-08-05 NOTE — Patient Instructions (Signed)
PLEASE FEEL FREE TO CALL OUR OFFICE WITH ANY PROBLEMS OR QUESTIONS ([phone])  HAVE A HAPPY HOLIDAYS!                     ^                  ^^                ^ ^ ^             ^ ^ ^ ^ ^           ^ ^ ^ ^ ^ ^ ^        ^ ^ ^ ^ ^ ^ ^ ^ ^      ^ ^ ^ ^ ^ ^ ^ ^ ^ ^ ^                ^^^^                ^^^^                ^^^^     

## 2017-08-05 NOTE — Progress Notes (Signed)
Subjective:    Patient ID: Mary [name], female    DOB: [birth year], 41 y.o.   MRN: [id number]  HPI   Mary [name] is here in follow-up of her chronic pain syndrome and lupus syndrome.  She is in the middle of another spell which was first noticed this weekend.  Mother states that she got overheated doing some things with some friends and began to have increased headaches and subsequent decreased follow-up responsiveness.  She is remained conscious the whole time but is very dissociated and flat.  She is continent of bowels and bladder.  She can attend to her own basic self-care needs but does not initiate conversation.  She becomes unaware of surroundings.  Does not recognize anyone except for very familiar faces.  She has had increased headaches which mom is used Imitrex for with some relief.  Pain Inventory Average Pain 6 Pain Right Now 8 My pain is aching  In the last 24 hours, has pain interfered with the following? General activity 7 Relation with others 7 Enjoyment of life 7 What TIME of day is your pain at its worst? . Sleep (in general) Fair  Pain is worse with: . Pain improves with: medication Relief from Meds: 5  Mobility walk without assistance ability to climb steps?  yes do you drive?  no  Function not employed: date last employed .  Neuro/Psych weakness confusion  Prior Studies Any changes since last visit?  no  Physicians involved in your care Any changes since last visit?  no   Family History  Problem Relation Age of Onset  . Diabetes Mother   . Diabetes Father   . Colitis Maternal Aunt   . Diabetes Unknown        many family members  . Lupus Neg Hx   . Sarcoidosis Neg Hx   . Pancreatitis Neg Hx   . Ataxia Neg Hx   . Chorea Neg Hx   . Dementia Neg Hx   . Mental retardation Neg Hx   . Migraines Neg Hx   . Multiple sclerosis Neg Hx   . Neurofibromatosis Neg Hx   . Neuropathy Neg Hx   . Parkinsonism Neg Hx   . Seizures Neg Hx   . Stroke Neg Hx    . Colon cancer Neg Hx    Social History   Socioeconomic History  . Marital status: Single    Spouse name: None  . Number of children: 0  . Years of education: None  . Highest education level: None  Social Needs  . Financial resource strain: None  . Food insecurity - worry: None  . Food insecurity - inability: None  . Transportation needs - medical: None  . Transportation needs - non-medical: None  Occupational History  . None  Tobacco Use  . Smoking status: Former Smoker    Packs/day: 0.50    Years: 20.00    Pack years: 10.00    Types: Cigarettes    Last attempt to quit: 05/22/2013    Years since quitting: 4.2  . Smokeless tobacco: Never Used  Substance and Sexual Activity  . Alcohol use: No    Alcohol/week: 0.0 oz  . Drug use: No  . Sexual activity: No    Birth control/protection: Abstinence  Other Topics Concern  . None  Social History Narrative   ** Merged History Encounter **       Lives permanently in [place] with mom and stepfather.  Has not started working yet and was  unemployed in [place].         Past Surgical History:  Procedure Laterality Date  . ABDOMINAL SURGERY  ~ 2007   some sort of pancreatic cyst drainage.   . ABDOMINAL SURGERY    . ADENOIDECTOMY    . CHOLECYSTECTOMY     ~ age 41-laparoscopic  . CHOLECYSTECTOMY    . EUS N/A 04/14/2013   Procedure: UPPER ENDOSCOPIC ULTRASOUND (EUS) LINEAR;  Surgeon: Rachael Fee, MD;  Location: [facility];  Service: Endoscopy;  Laterality: N/A;  . ROUX-EN-Y GASTRIC BYPASS    . ROUX-EN-Y PROCEDURE     stent to pancreatic cyst that became infected within 36 hours   Past Medical History:  Diagnosis Date  . Abdominal hernia   . Adrenal insufficiency (HCC) 04/23/2013   ??  Marland Kitchen Anxiety   . Depression    "recent breakup with partner of 15 yrs"  . Depression   . GERD (gastroesophageal reflux disease)   . Hernia 03-24-13   ventral hernia remains   . MS (multiple sclerosis) (HCC)   . Pancreatic cyst 2002 onset    . Pancreatitis 03-24-13   2002, 2'2013, 03-14-13  . Ventral hernia    There were no vitals taken for this visit.  Opioid Risk Score:   Fall Risk Score:  `1  Depression screen PHQ 2/9  Depression screen [facility] 2/9 05/13/2017 04/09/2016 10/01/2015 05/21/2015 11/01/2014  Decreased Interest 3 2 2 2 2   Down, Depressed, Hopeless 3 2 2  - 2  PHQ - 2 Score 6 4 4 2 4   Altered sleeping - - - - 1  Tired, decreased energy - - - - 1  Change in appetite - - - - 3  Feeling bad or failure about yourself  - - - - 0  Trouble concentrating - - - - 1  Moving slowly or fidgety/restless - - - - 0  PHQ-9 Score - - - - 10     Review of Systems  Constitutional: Positive for unexpected weight change.  HENT: Negative.   Eyes: Negative.   Respiratory: Positive for cough and shortness of breath.   Cardiovascular: Negative.   Gastrointestinal: Positive for nausea and vomiting.  Endocrine: Negative.   Genitourinary: Negative.   Musculoskeletal: Negative.   Skin: Negative.   Allergic/Immunologic: Negative.   Neurological: Negative.   Hematological: Negative.   Psychiatric/Behavioral: Positive for confusion and decreased concentration.  All other systems reviewed and are negative.      Objective:   Physical Exam  General: Alert and oriented x 3, No apparent distress. obese  HEENT:Head is normocephalic, atraumatic, PERRLA, EOMI, sclera anicteric, oral mucosa pink and moist, dentition intact, ext ear canals clear,  Neck:Supple without JVD or lymphadenopathy  Heart:RRR Chest:CTA B Abdomen:NT.  Extremities:No clubbing, cyanosis, or edema. Pulses are 2+  Skin:Clean and intact without signs of breakdown  Neuro:Moves all 4 extremities but inconsistent.  . Musculoskeletal: lumbar spine TTP.. . Patient is flat and associated today.  She nods affirmatively to questions.  She does not recognize me or even see me as familiar.  Psych:Extremely flat and disengaged     Assessment & Plan:  1.  Non-specific bicerebral/brainstem white matter disease---initially suspected to be MS, but not characteristic in presentation. LikelySLE.  -continued memory and balance deficits.  2. Chronic pain syndrome related to above with primarily neuropathic right sided pain. She has had a recent increase in cervical pain and related headaches over the last several months as well.  3. Low back pain with ?  Radiculopathy RLE  4. Chronic pancreatitis  5. Conversion disorder   Plan:  1. Continue topamax 100mg  am and 100mg  qpm for headaches and neuropathic pain.  -increased imitrex to 100mg  for breakthrough migraine - botox ? 2. Continue psychiatry follow up. Many of her neurological symptoms are likely non-organic in nature given her recent spontaneous improvement and relapse. Pt/family aware -psychology has seen pt             -pt is in the middle of another spell and family is managing these conservatively now.  3. Oxycodone was refilled. #120 10mg  was refilled today.  second rx given. We will continue the opioid monitoring program, this consists of regular clinic visits, examinations, routine drug screening, pill counts as well as use of [place] Controlled Substance Reporting System. [place] was reviewed today.    4. TENS unit prn.  7. Rheumatology follow up as directed.  8. Follow up with NPin about 2 months. 15 minutes were spent during this visit. All questions were encouraged and answered. Marland Kitchen

## 2017-08-06 ENCOUNTER — Telehealth: Payer: Self-pay

## 2017-08-06 NOTE — Telephone Encounter (Signed)
Prior auth started today for oxycodone from Medicaid.  08-06-2017

## 2017-08-07 NOTE — Telephone Encounter (Signed)
Checked status of prior authorization today through [employer], 08-07-2017, still pending 

## 2017-08-15 ENCOUNTER — Other Ambulatory Visit: Payer: Self-pay | Admitting: Physical Medicine & Rehabilitation

## 2017-08-15 DIAGNOSIS — G894 Chronic pain syndrome: Secondary | ICD-10-CM

## 2017-08-15 DIAGNOSIS — M329 Systemic lupus erythematosus, unspecified: Secondary | ICD-10-CM

## 2017-08-15 DIAGNOSIS — G43719 Chronic migraine without aura, intractable, without status migrainosus: Secondary | ICD-10-CM

## 2017-08-15 DIAGNOSIS — M5416 Radiculopathy, lumbar region: Secondary | ICD-10-CM

## 2017-08-15 DIAGNOSIS — R531 Weakness: Secondary | ICD-10-CM

## 2017-08-17 NOTE — Telephone Encounter (Signed)
Prior authorization has returned as being approved 08-17-2017, pharmacy notified.

## 2017-08-17 NOTE — Telephone Encounter (Signed)
Recieved electronic medication refill request for baclofen, no mention of this medication in previous notes for this patient and all refill requests up to this point have been for robaxin,   Unsure to refill this medication based on avaliable information, please advise

## 2017-09-22 ENCOUNTER — Other Ambulatory Visit: Payer: Self-pay

## 2017-09-22 DIAGNOSIS — M329 Systemic lupus erythematosus, unspecified: Secondary | ICD-10-CM

## 2017-09-22 DIAGNOSIS — G43719 Chronic migraine without aura, intractable, without status migrainosus: Secondary | ICD-10-CM

## 2017-09-22 DIAGNOSIS — M5416 Radiculopathy, lumbar region: Secondary | ICD-10-CM

## 2017-09-22 DIAGNOSIS — R531 Weakness: Secondary | ICD-10-CM

## 2017-09-22 DIAGNOSIS — G894 Chronic pain syndrome: Secondary | ICD-10-CM

## 2017-09-22 MED ORDER — BACLOFEN 10 MG PO TABS
10.0000 mg | ORAL_TABLET | Freq: Every evening | ORAL | 3 refills | Status: DC | PRN
Start: 2017-09-22 — End: 2018-02-22

## 2017-10-05 ENCOUNTER — Encounter: Payer: Medicaid Other | Attending: Physical Medicine & Rehabilitation | Admitting: Physical Medicine & Rehabilitation

## 2017-10-05 ENCOUNTER — Encounter: Payer: Self-pay | Admitting: Physical Medicine & Rehabilitation

## 2017-10-05 VITALS — BP 103/63 | HR 87

## 2017-10-05 DIAGNOSIS — Z5181 Encounter for therapeutic drug level monitoring: Secondary | ICD-10-CM | POA: Diagnosis not present

## 2017-10-05 DIAGNOSIS — K861 Other chronic pancreatitis: Secondary | ICD-10-CM | POA: Diagnosis not present

## 2017-10-05 DIAGNOSIS — Z79899 Other long term (current) drug therapy: Secondary | ICD-10-CM

## 2017-10-05 DIAGNOSIS — R531 Weakness: Secondary | ICD-10-CM

## 2017-10-05 DIAGNOSIS — M329 Systemic lupus erythematosus, unspecified: Secondary | ICD-10-CM

## 2017-10-05 DIAGNOSIS — M545 Low back pain: Secondary | ICD-10-CM | POA: Diagnosis not present

## 2017-10-05 DIAGNOSIS — F4310 Post-traumatic stress disorder, unspecified: Secondary | ICD-10-CM

## 2017-10-05 DIAGNOSIS — G894 Chronic pain syndrome: Secondary | ICD-10-CM

## 2017-10-05 DIAGNOSIS — G35 Multiple sclerosis: Secondary | ICD-10-CM | POA: Diagnosis not present

## 2017-10-05 MED ORDER — OXYCODONE HCL 10 MG PO TABS: 10.0000 mg | ORAL_TABLET | Freq: Four times a day (QID) | ORAL | 0 refills | Status: DC | PRN

## 2017-10-05 MED ORDER — NORTRIPTYLINE HCL 10 MG PO CAPS: 10.0000 mg | ORAL_CAPSULE | Freq: Every day | ORAL | 2 refills | Status: DC

## 2017-10-05 NOTE — Patient Instructions (Signed)
PLEASE FEEL FREE TO CALL OUR OFFICE WITH ANY PROBLEMS OR QUESTIONS ([phone])      

## 2017-10-05 NOTE — Progress Notes (Signed)
Subjective:    Patient ID: Mary [name], female    DOB: [birth year], 42 y.o.   MRN: [id number]  HPI Lace is here in follow-up of her chronic pain syndrome and suspected lupus disorder.  I last saw her she was going through another disassociative episode where she was not recognizing anybody and essentially noncoherent.  Mother stated it lasted about 3 weeks and has since improved.  In the meantime they have had issues with scheduling with psychiatry and Moe ended up missing Klonopin doses as well as some of her other medications.  She has now resumed Klonopin with the help of her primary and is using 1 mg twice a day.  Javonna states that since then her sleep is been a problem and she is heart has a hard time falling asleep and staying asleep as well.  She just cannot get her mind to relax at night.  She often feels tired as a result.  I asked her about being tested for sleep apnea and she tells [name] she never has been although it has been discussed before.  She is using oxycodone for breakthrough pain which she does not feel is helping as much either.  She is having a lot of headaches as well as pins and needles types of pain as well.  He does remain on Topamax as prescribed.  She is using Imitrex for breakthrough symptoms   Pain Inventory Average Pain 7 Pain Right Now 0 My pain is sharp, stabbing, tingling and aching  In the last 24 hours, has pain interfered with the following? General activity 8 Relation with others 7 Enjoyment of life 8 What TIME of day is your pain at its worst? morning, and evening  Sleep (in general) Poor  Pain is worse with: walking, bending, standing and some activites Pain improves with: n/a Relief from Meds: 4  Mobility use a cane how many minutes can you walk? 10 do you drive?  no Do you have any goals in this area?  no  Function disabled: date disabled  n/a  Neuro/Psych weakness numbness tingling spasms dizziness confusion depression anxiety  Prior Studies Any changes since last visit?  no  Physicians involved in your care Any changes since last visit?  no   Family History  Problem Relation Age of Onset  . Diabetes Mother   . Diabetes Father   . Colitis Maternal Aunt   . Diabetes Unknown        many family members  . Lupus Neg Hx   . Sarcoidosis Neg Hx   . Pancreatitis Neg Hx   . Ataxia Neg Hx   . Chorea Neg Hx   . Dementia Neg Hx   . Mental retardation Neg Hx   . Migraines Neg Hx   . Multiple sclerosis Neg Hx   . Neurofibromatosis Neg Hx   . Neuropathy Neg Hx   . Parkinsonism Neg Hx   . Seizures Neg Hx   . Stroke Neg Hx   . Colon cancer Neg Hx    Social History   Socioeconomic History  . Marital status: Single    Spouse name: Not on file  . Number of children: 0  . Years of education: Not on file  . Highest education level: Not on file  Social Needs  . Financial resource strain: Not on file  . Food insecurity - worry: Not on file  . Food insecurity - inability: Not on file  . Transportation needs - medical: Not on  file  . Transportation needs - non-medical: Not on file  Occupational History  . Not on file  Tobacco Use  . Smoking status: Former Smoker    Packs/day: 0.50    Years: 20.00    Pack years: 10.00    Types: Cigarettes    Last attempt to quit: 05/22/2013    Years since quitting: 4.3  . Smokeless tobacco: Never Used  Substance and Sexual Activity  . Alcohol use: No    Alcohol/week: 0.0 oz  . Drug use: No  . Sexual activity: No    Birth control/protection: Abstinence  Other Topics Concern  . Not on file  Social History Narrative   ** Merged History Encounter **       Lives permanently in [place] with mom and stepfather.  Has not started working yet and was unemployed in [place].         Past Surgical History:  Procedure Laterality Date  . ABDOMINAL SURGERY  ~ 2007   some sort of  pancreatic cyst drainage.   . ABDOMINAL SURGERY    . ADENOIDECTOMY    . CHOLECYSTECTOMY     ~ age 60-laparoscopic  . CHOLECYSTECTOMY    . EUS N/A 04/14/2013   Procedure: UPPER ENDOSCOPIC ULTRASOUND (EUS) LINEAR;  Surgeon: Rachael Fee, MD;  Location: [facility];  Service: Endoscopy;  Laterality: N/A;  . ROUX-EN-Y GASTRIC BYPASS    . ROUX-EN-Y PROCEDURE     stent to pancreatic cyst that became infected within 36 hours   Past Medical History:  Diagnosis Date  . Abdominal hernia   . Adrenal insufficiency (HCC) 04/23/2013   ??  Marland Kitchen Anxiety   . Depression    "recent breakup with partner of 15 yrs"  . Depression   . GERD (gastroesophageal reflux disease)   . Hernia 03-24-13   ventral hernia remains   . MS (multiple sclerosis) (HCC)   . Pancreatic cyst 2002 onset  . Pancreatitis 03-24-13   2002, 2'2013, 03-14-13  . Ventral hernia    There were no vitals taken for this visit.  Opioid Risk Score:   Fall Risk Score:  `1  Depression screen PHQ 2/9  Depression screen [facility] 2/9 05/13/2017 04/09/2016 10/01/2015 05/21/2015 11/01/2014  Decreased Interest 3 2 2 2 2   Down, Depressed, Hopeless 3 2 2  - 2  PHQ - 2 Score 6 4 4 2 4   Altered sleeping - - - - 1  Tired, decreased energy - - - - 1  Change in appetite - - - - 3  Feeling bad or failure about yourself  - - - - 0  Trouble concentrating - - - - 1  Moving slowly or fidgety/restless - - - - 0  PHQ-9 Score - - - - 10     Review of Systems  Gastrointestinal: Positive for abdominal pain, nausea and vomiting.  Neurological: Positive for dizziness, weakness and numbness.       Tingling  Spasms  Psychiatric/Behavioral: Positive for confusion and dysphoric mood. The patient is nervous/anxious.   All other systems reviewed and are negative.      Objective:   Physical Exam  General: Alert and oriented x 3, obese  HEENT:Head is normocephalic, atraumatic, PERRLA, EOMI, sclera anicteric, oral mucosa pink and moist, dentition intact, ext ear  canals clear,  Neck:Supple without JVD or lymphadenopathy  Heart:RRR Chest:CTA B Abdomen:NT.  Extremities:No clubbing, cyanosis, or edema. Pulses are 2+  Skin:Clean and intact without signs of breakdown  Neuro:Moves all 4 extremities, normal  gait. Cognition improved. Remembers [name]. Fair insight and awareness. Attention sometimes limited. .  . Musculoskeletal: lumbar posture fair Psych:pt more up beat and engaged today   Assessment & Plan:  1. Non-specific bicerebral/brainstem white matter disease---initially suspected to be MS, but not characteristic in presentation. LikelySLE.  -continued memory and balance deficits.  2. Chronic pain syndrome related to above with primarily neuropathic right sided pain. She has had a recent increase in cervical pain and related headaches over the last several months as well.  3. Low back pain with ? Radiculopathy RLE  4. Chronic pancreatitis  5. Conversion disorder   Plan:  1.Continuetopamax 100mg  am and 100mg  qpm for headaches and neuropathic pain.  -maintain imitrex to 100mg  for breakthrough migraine -botox ? 2. Continue psychiatry follow up. Many of her neurological symptoms are likely non-organic in nature given her recent spontaneous improvement and relapse. Pt/family aware -Will make referral to Dr. Kieth Brightly to assist with mood and coping skills. She has been experiencing more nightmares and issues associated with PTSD. He may also help to shed some light on her "disassociative episodes."     -initiate nortriptyline 10-20mg  at night for sleep, ptsd, pain 3. Oxycodone was refilled. #120 10mg  was refilled today.  opioid monitoring program, this consists of regular clinic visits, examinations, routine drug screening, pill counts as well as use of [place] Controlled Substance Reporting System. [place] was [place] today.    -UDS today 4.  Insomnia: Hopefully the  nortriptyline will help somewhat with this.  I do suspect she may be dealing with sleep apnea as well.  Need to consider formal testing for this..  7. Rheumatology follow up as directed.  8. Follow up withme or NP in about one month. were spent during this visit. All questions were encouraged and answered. Marland Kitchen

## 2017-10-10 LAB — TOXASSURE SELECT,+ANTIDEPR,UR

## 2017-10-12 ENCOUNTER — Telehealth: Payer: Self-pay | Admitting: *Deleted

## 2017-10-12 NOTE — Telephone Encounter (Signed)
Urine drug screen for this encounter is consistent for prescribed medication 

## 2017-11-02 ENCOUNTER — Encounter: Payer: Medicaid Other | Attending: Physical Medicine & Rehabilitation | Admitting: Registered Nurse

## 2017-11-02 ENCOUNTER — Encounter: Payer: Self-pay | Admitting: Registered Nurse

## 2017-11-02 VITALS — BP 115/74 | HR 79 | Temp 97.9°F

## 2017-11-02 DIAGNOSIS — Z79899 Other long term (current) drug therapy: Secondary | ICD-10-CM | POA: Diagnosis not present

## 2017-11-02 DIAGNOSIS — K861 Other chronic pancreatitis: Secondary | ICD-10-CM | POA: Diagnosis not present

## 2017-11-02 DIAGNOSIS — G43719 Chronic migraine without aura, intractable, without status migrainosus: Secondary | ICD-10-CM

## 2017-11-02 DIAGNOSIS — M329 Systemic lupus erythematosus, unspecified: Secondary | ICD-10-CM | POA: Diagnosis not present

## 2017-11-02 DIAGNOSIS — Z5181 Encounter for therapeutic drug level monitoring: Secondary | ICD-10-CM

## 2017-11-02 DIAGNOSIS — G8929 Other chronic pain: Secondary | ICD-10-CM

## 2017-11-02 DIAGNOSIS — M545 Low back pain: Secondary | ICD-10-CM

## 2017-11-02 DIAGNOSIS — F4310 Post-traumatic stress disorder, unspecified: Secondary | ICD-10-CM | POA: Diagnosis not present

## 2017-11-02 DIAGNOSIS — G894 Chronic pain syndrome: Secondary | ICD-10-CM

## 2017-11-02 DIAGNOSIS — R531 Weakness: Secondary | ICD-10-CM

## 2017-11-02 DIAGNOSIS — M25561 Pain in right knee: Secondary | ICD-10-CM

## 2017-11-02 DIAGNOSIS — G35 Multiple sclerosis: Secondary | ICD-10-CM

## 2017-11-02 MED ORDER — OXYCODONE HCL 10 MG PO TABS: 10.0000 mg | ORAL_TABLET | Freq: Four times a day (QID) | ORAL | 0 refills | Status: DC | PRN

## 2017-11-02 MED ORDER — METHOCARBAMOL 500 MG PO TABS: 500.0000 mg | ORAL_TABLET | Freq: Two times a day (BID) | ORAL | 3 refills | Status: DC

## 2017-11-02 NOTE — Progress Notes (Signed)
Subjective:    Patient ID: Mary [name], female    DOB: 19-Mar-1976, 42 y.o.   MRN: [id number]  HPI: [name] was a 42 year old female who returns for follow up for chronic pain and medication refill. She states her pain is located in her head ( chronic headache), right shoulder, lower back, right knee and generalized pain all over the right side of her body.  She rates her pain 8. Her current exercise regime is walking.  [name] reports she's receiving good results with the amitriptyline, regarding her insomnia.  [name] Morphine equivalent is 56.00 MME. She is also prescribed Klonopin by Roanna Epley NP from [facility]. We have discussed the black box warning of using opioids and benzodiazepines. She is being closely monitored and under the care of her psychiatrist. I highlighted the dangers of using these drugs together and discussed the adverse events including respiratory suppression, overdose, cognitive impairment and importance of  compliance with current regimen. She verbalizes understanding, we will continue to monitor and adjust as indicated.    Father in room all questions answered.  Pain Inventory Average Pain 8 Pain Right Now 8 My pain is sharp, burning, stabbing and aching  In the last 24 hours, has pain interfered with the following? General activity 8 Relation with others 6 Enjoyment of life 7 What TIME of day is your pain at its worst? morning, night Sleep (in general) Poor  Pain is worse with: walking, bending, standing and some activites Pain improves with: rest, pacing activities and medication Relief from Meds: 6  Mobility walk without assistance ability to climb steps?  yes do you drive?  no  Function disabled: date disabled . I need assistance with the following:  shopping  Neuro/Psych numbness tingling confusion depression anxiety  Prior Studies Any changes since last visit?   no  Physicians involved in your care Any changes since last visit?  no   Family History  Problem Relation Age of Onset  . Diabetes Mother   . Diabetes Father   . Colitis Maternal Aunt   . Diabetes Unknown        many family members  . Lupus Neg Hx   . Sarcoidosis Neg Hx   . Pancreatitis Neg Hx   . Ataxia Neg Hx   . Chorea Neg Hx   . Dementia Neg Hx   . Mental retardation Neg Hx   . Migraines Neg Hx   . Multiple sclerosis Neg Hx   . Neurofibromatosis Neg Hx   . Neuropathy Neg Hx   . Parkinsonism Neg Hx   . Seizures Neg Hx   . Stroke Neg Hx   . Colon cancer Neg Hx    Social History   Socioeconomic History  . Marital status: Single    Spouse name: Not on file  . Number of children: 0  . Years of education: Not on file  . Highest education level: Not on file  Social Needs  . Financial resource strain: Not on file  . Food insecurity - worry: Not on file  . Food insecurity - inability: Not on file  . Transportation needs - medical: Not on file  . Transportation needs - non-medical: Not on file  Occupational History  . Not on file  Tobacco Use  . Smoking status: Former Smoker    Packs/day: 0.50    Years: 20.00    Pack years: 10.00    Types: Cigarettes  Last attempt to quit: 05/22/2013    Years since quitting: 4.4  . Smokeless tobacco: Never Used  Substance and Sexual Activity  . Alcohol use: No    Alcohol/week: 0.0 oz  . Drug use: No  . Sexual activity: No    Birth control/protection: Abstinence  Other Topics Concern  . Not on file  Social History Narrative   ** Merged History Encounter **       Lives permanently in [place] with mom and stepfather.  Has not started working yet and was unemployed in [place].         Past Surgical History:  Procedure Laterality Date  . ABDOMINAL SURGERY  ~ 2007   some sort of pancreatic cyst drainage.   . ABDOMINAL SURGERY    . ADENOIDECTOMY    . CHOLECYSTECTOMY     ~ age 59-laparoscopic  . CHOLECYSTECTOMY    . EUS  N/A 04/14/2013   Procedure: UPPER ENDOSCOPIC ULTRASOUND (EUS) LINEAR;  Surgeon: Rachael Fee, MD;  Location: [facility];  Service: Endoscopy;  Laterality: N/A;  . ROUX-EN-Y GASTRIC BYPASS    . ROUX-EN-Y PROCEDURE     stent to pancreatic cyst that became infected within 36 hours   Past Medical History:  Diagnosis Date  . Abdominal hernia   . Adrenal insufficiency (HCC) 04/23/2013   ??  Marland Kitchen Anxiety   . Depression    "recent breakup with partner of 15 yrs"  . Depression   . GERD (gastroesophageal reflux disease)   . Hernia 03-24-13   ventral hernia remains   . MS (multiple sclerosis) (HCC)   . Pancreatic cyst 2002 onset  . Pancreatitis 03-24-13   2002, 2'2013, 03-14-13  . Ventral hernia    LMP  (LMP Unknown)   Opioid Risk Score:   Fall Risk Score:  `1  Depression screen PHQ 2/9  Depression screen [facility] 2/9 05/13/2017 04/09/2016 10/01/2015 05/21/2015 11/01/2014  Decreased Interest 3 2 2 2 2   Down, Depressed, Hopeless 3 2 2  - 2  PHQ - 2 Score 6 4 4 2 4   Altered sleeping - - - - 1  Tired, decreased energy - - - - 1  Change in appetite - - - - 3  Feeling bad or failure about yourself  - - - - 0  Trouble concentrating - - - - 1  Moving slowly or fidgety/restless - - - - 0  PHQ-9 Score - - - - 10    Review of Systems  HENT: Positive for congestion, ear pain, postnasal drip, rhinorrhea, sneezing and sore throat.   Eyes: Negative.   Respiratory: Positive for cough and wheezing.   Gastrointestinal: Positive for abdominal pain, nausea and vomiting.  Endocrine: Negative.   Genitourinary: Negative.   Musculoskeletal: Positive for arthralgias, back pain, myalgias, neck pain and neck stiffness.  Skin: Negative.   Allergic/Immunologic: Negative.   Neurological: Positive for weakness, numbness and headaches.       Tingling   Hematological: Negative.   Psychiatric/Behavioral: Positive for confusion and dysphoric mood. The patient is nervous/anxious.   All other systems reviewed and are  negative.      Objective:   Physical Exam  Constitutional: She appears well-developed and well-nourished.  HENT:  Head: Normocephalic and atraumatic.  Neck: Normal range of motion. Neck supple.  Cervical Paraspinal Tenderness: C-5-C-6  Cardiovascular: Normal rate and regular rhythm.  Pulmonary/Chest: Effort normal and breath sounds normal.  Musculoskeletal:  Normal Muscle Bulk and Muscle Testing Reveals:  Upper Extremities: Right Decreased  ROM 90 Degrees  and Muscle Strength on the Right 4/5 Right AC Joint Tenderness  and Left Full ROM and Muscle Strength  5/5 Thoracic and Lumbar Hypersensitivity Mainly Right Side Lower Extremities: Full ROM and Muscle Strength 5/5 Right Lower Extremity Flexion Produces Pain into Right Patella and Right Ankle  Arises from Table with ease Narrow Based gait  Neurological: She is alert.  Skin: Skin is warm and dry.  Psychiatric: She has a normal mood and affect.  Nursing note and vitals reviewed.         Assessment & Plan:  1. MS: Neurology Following. 11/02/2017 2. 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 #120. 11/02/2017 3. Low back pain with Radiculopathy RLE/: Continued topamax.11/02/2017  4. Chronic pancreatitis: PCP and Gastroenterologist Following 5. Insomnia: Continue Pamelor. 11/02/2017  6. PTSD: Psychiatry Following. Has a scheduled appointment with Dr. Kieth Brightly. 11/02/2017. 7. Lupus: Rheumatology Following 8. Migraine: Continue Imitrex and Topamax 9. Right Knee Pain: Continue HEP as Tolerated. Continue to Monitor.   of face to face patient care time was spent during this visit. All questions were encouraged and answered.

## 2017-12-14 ENCOUNTER — Encounter: Payer: Medicaid Other | Attending: Physical Medicine & Rehabilitation | Admitting: Psychology

## 2017-12-14 DIAGNOSIS — K861 Other chronic pancreatitis: Secondary | ICD-10-CM | POA: Insufficient documentation

## 2017-12-14 DIAGNOSIS — F4310 Post-traumatic stress disorder, unspecified: Secondary | ICD-10-CM | POA: Diagnosis not present

## 2017-12-14 DIAGNOSIS — M545 Low back pain: Secondary | ICD-10-CM | POA: Insufficient documentation

## 2017-12-14 DIAGNOSIS — G894 Chronic pain syndrome: Secondary | ICD-10-CM | POA: Insufficient documentation

## 2017-12-14 DIAGNOSIS — M329 Systemic lupus erythematosus, unspecified: Secondary | ICD-10-CM | POA: Diagnosis not present

## 2017-12-14 DIAGNOSIS — G35 Multiple sclerosis: Secondary | ICD-10-CM

## 2017-12-17 ENCOUNTER — Telehealth: Payer: Self-pay | Admitting: Physical Medicine & Rehabilitation

## 2017-12-17 DIAGNOSIS — G894 Chronic pain syndrome: Secondary | ICD-10-CM

## 2017-12-17 DIAGNOSIS — Z5181 Encounter for therapeutic drug level monitoring: Secondary | ICD-10-CM

## 2017-12-17 DIAGNOSIS — M329 Systemic lupus erythematosus, unspecified: Secondary | ICD-10-CM

## 2017-12-17 DIAGNOSIS — R531 Weakness: Secondary | ICD-10-CM

## 2017-12-17 DIAGNOSIS — Z79899 Other long term (current) drug therapy: Secondary | ICD-10-CM

## 2017-12-17 DIAGNOSIS — G35 Multiple sclerosis: Secondary | ICD-10-CM

## 2017-12-17 MED ORDER — OXYCODONE HCL 10 MG PO TABS: 10.0000 mg | ORAL_TABLET | Freq: Four times a day (QID) | ORAL | 0 refills | Status: DC | PRN

## 2017-12-17 NOTE — Telephone Encounter (Signed)
Placed a call to Mary [name], According to the PMP Aware Web-site last prescription  Of Oxycodone was filled on 11/09/2017. Prescription e- scribe. She verbalizes understanding. She has a scheduled appointment with Dr Riley Kill on 12/28/2017.

## 2017-12-17 NOTE — Telephone Encounter (Signed)
Pt phoned stating she went to the pharmacy to pick up her oxycodone, but the Rx was not there. Pt stated she was completely out of meds. Please advise.

## 2017-12-20 ENCOUNTER — Encounter: Payer: Self-pay | Admitting: Psychology
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
## 2017-12-28 ENCOUNTER — Encounter: Payer: Self-pay | Admitting: Physical Medicine & Rehabilitation

## 2017-12-28 ENCOUNTER — Encounter: Payer: Medicaid Other | Attending: Physical Medicine & Rehabilitation | Admitting: Physical Medicine & Rehabilitation

## 2017-12-28 ENCOUNTER — Other Ambulatory Visit: Payer: Self-pay

## 2017-12-28 DIAGNOSIS — G894 Chronic pain syndrome: Secondary | ICD-10-CM | POA: Insufficient documentation

## 2017-12-28 DIAGNOSIS — G35 Multiple sclerosis: Secondary | ICD-10-CM | POA: Insufficient documentation

## 2017-12-28 DIAGNOSIS — K861 Other chronic pancreatitis: Secondary | ICD-10-CM | POA: Insufficient documentation

## 2017-12-28 DIAGNOSIS — M545 Low back pain: Secondary | ICD-10-CM | POA: Diagnosis not present

## 2017-12-28 DIAGNOSIS — Z79899 Other long term (current) drug therapy: Secondary | ICD-10-CM

## 2017-12-28 DIAGNOSIS — Z5181 Encounter for therapeutic drug level monitoring: Secondary | ICD-10-CM | POA: Diagnosis not present

## 2017-12-28 DIAGNOSIS — R531 Weakness: Secondary | ICD-10-CM

## 2017-12-28 DIAGNOSIS — M329 Systemic lupus erythematosus, unspecified: Secondary | ICD-10-CM

## 2017-12-28 MED ORDER — OXYCODONE HCL 10 MG PO TABS: 10.0000 mg | ORAL_TABLET | Freq: Four times a day (QID) | ORAL | 0 refills | Status: DC | PRN

## 2017-12-28 MED ORDER — NORTRIPTYLINE HCL 25 MG PO CAPS
25.0000 mg | ORAL_CAPSULE | Freq: Every day | ORAL | 2 refills | Status: DC
Start: 2017-12-28 — End: 2018-05-15

## 2017-12-28 NOTE — Patient Instructions (Signed)
PLEASE FEEL FREE TO CALL OUR OFFICE WITH ANY PROBLEMS OR QUESTIONS ([phone])      
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
## 2017-12-30 ENCOUNTER — Other Ambulatory Visit: Payer: Self-pay | Admitting: Physical Medicine & Rehabilitation

## 2018-01-15 ENCOUNTER — Emergency Department (HOSPITAL_COMMUNITY)
Admission: EM | Admit: 2018-01-15 | Discharge: 2018-01-15 | Disposition: A | Discharge status: Home / Self Care | Payer: Medicaid Other | Attending: Emergency Medicine | Admitting: Emergency Medicine

## 2018-01-15 ENCOUNTER — Other Ambulatory Visit: Payer: Self-pay

## 2018-01-15 ENCOUNTER — Encounter (HOSPITAL_COMMUNITY): Payer: Self-pay | Admitting: Emergency Medicine

## 2018-01-15 ENCOUNTER — Emergency Department (HOSPITAL_COMMUNITY): Payer: Medicaid Other

## 2018-01-15 DIAGNOSIS — K861 Other chronic pancreatitis: Secondary | ICD-10-CM | POA: Insufficient documentation

## 2018-01-15 DIAGNOSIS — G35 Multiple sclerosis: Secondary | ICD-10-CM | POA: Diagnosis not present

## 2018-01-15 DIAGNOSIS — R1013 Epigastric pain: Secondary | ICD-10-CM | POA: Diagnosis not present

## 2018-01-15 DIAGNOSIS — Z87891 Personal history of nicotine dependence: Secondary | ICD-10-CM | POA: Insufficient documentation

## 2018-01-15 DIAGNOSIS — R1110 Vomiting, unspecified: Secondary | ICD-10-CM

## 2018-01-15 DIAGNOSIS — Z79899 Other long term (current) drug therapy: Secondary | ICD-10-CM | POA: Insufficient documentation

## 2018-01-15 LAB — CBC
HCT: 40.6 % (ref 36.0–46.0)
Hemoglobin: 12.8 g/dL (ref 12.0–15.0)
MCH: 27.4 pg (ref 26.0–34.0)
MCHC: 31.5 g/dL (ref 30.0–36.0)
MCV: 86.8 fL (ref 78.0–100.0)
PLATELETS: 361 10*3/uL (ref 150–400)
RBC: 4.68 MIL/uL (ref 3.87–5.11)
RDW: 16.3 % — ABNORMAL HIGH (ref 11.5–15.5)
WBC: 10.1 10*3/uL (ref 4.0–10.5)

## 2018-01-15 LAB — COMPREHENSIVE METABOLIC PANEL
ALK PHOS: 130 U/L — AB (ref 38–126)
ALT: 22 U/L (ref 14–54)
AST: 17 U/L (ref 15–41)
Albumin: 4.3 g/dL (ref 3.5–5.0)
Anion gap: 8 (ref 5–15)
BUN: 14 mg/dL (ref 6–20)
CALCIUM: 9.4 mg/dL (ref 8.9–10.3)
CO2: 23 mmol/L (ref 22–32)
CREATININE: 0.78 mg/dL (ref 0.44–1.00)
Chloride: 110 mmol/L (ref 101–111)
Glucose, Bld: 98 mg/dL (ref 65–99)
Potassium: 3.9 mmol/L (ref 3.5–5.1)
Sodium: 141 mmol/L (ref 135–145)
Total Bilirubin: 0.3 mg/dL (ref 0.3–1.2)
Total Protein: 8.2 g/dL — ABNORMAL HIGH (ref 6.5–8.1)

## 2018-01-15 LAB — URINALYSIS, ROUTINE W REFLEX MICROSCOPIC
Bilirubin Urine: NEGATIVE
GLUCOSE, UA: NEGATIVE mg/dL
Hgb urine dipstick: NEGATIVE
KETONES UR: NEGATIVE mg/dL
NITRITE: NEGATIVE
PH: 5 (ref 5.0–8.0)
Protein, ur: NEGATIVE mg/dL
Specific Gravity, Urine: 1.018 (ref 1.005–1.030)

## 2018-01-15 LAB — LIPASE, BLOOD: LIPASE: 19 U/L (ref 11–51)

## 2018-01-15 MED ORDER — OMEPRAZOLE 20 MG PO CPDR: 20.0000 mg | DELAYED_RELEASE_CAPSULE | Freq: Every day | ORAL | 0 refills | Status: DC

## 2018-01-15 MED ORDER — HYDROMORPHONE HCL 1 MG/ML IJ SOLN
1.0000 mg | Freq: Once | INTRAMUSCULAR | Status: AC
  Administered 2018-01-15: 1 mg via INTRAVENOUS
  Filled 2018-01-15: qty 1

## 2018-01-15 MED ORDER — METOCLOPRAMIDE HCL 5 MG/ML IJ SOLN
10.0000 mg | Freq: Once | INTRAMUSCULAR | Status: AC
  Administered 2018-01-15: 10 mg via INTRAVENOUS
  Filled 2018-01-15: qty 2

## 2018-01-15 MED ORDER — FAMOTIDINE 20 MG PO TABS: 20.0000 mg | ORAL_TABLET | Freq: Two times a day (BID) | ORAL | 0 refills | Status: DC

## 2018-01-15 MED ORDER — GI COCKTAIL ~~LOC~~
30.0000 mL | Freq: Once | ORAL | Status: AC
  Administered 2018-01-15: 30 mL via ORAL
  Filled 2018-01-15: qty 30

## 2018-01-15 MED ORDER — FAMOTIDINE 20 MG PO TABS
20.0000 mg | ORAL_TABLET | Freq: Once | ORAL | Status: AC
  Administered 2018-01-15: 20 mg via ORAL
  Filled 2018-01-15: qty 1

## 2018-01-15 MED ORDER — IOPAMIDOL (ISOVUE-300) INJECTION 61%
INTRAVENOUS | Status: AC
  Filled 2018-01-15: qty 100

## 2018-01-15 MED ORDER — LACTATED RINGERS IV BOLUS
1000.0000 mL | Freq: Once | INTRAVENOUS | Status: AC
  Administered 2018-01-15: 1000 mL via INTRAVENOUS

## 2018-01-15 MED ORDER — PREDNISONE 5 MG PO TABS: 10.0000 mg | ORAL_TABLET | Freq: Every day | ORAL | Status: AC

## 2018-01-15 MED ORDER — IOPAMIDOL (ISOVUE-300) INJECTION 61%
100.0000 mL | Freq: Once | INTRAVENOUS | Status: AC | PRN
  Administered 2018-01-15: 100 mL via INTRAVENOUS

## 2018-01-15 NOTE — ED Provider Notes (Signed)
Emergency Department Provider Note   I have reviewed the triage vital signs and the nursing notes.   HISTORY  Chief Complaint Abdominal Pain   HPI Mary [name] is a 42 y.o. adult with multiple medical problems as documented below the presents to the emergency department today with epigastric pain that seems to radiate towards the right and the back similar to previous episodes of pancreatitis.  Also with some light green vomiting and nausea during that time as well.  No diarrhea or constipation.  Has had some chills but also has a history of heat intolerance in general.  No fevers.  Started while he was sleeping has progressively worsened since that time which was around 11:00 last night.  Has not tried anything for the symptoms. No other associated or modifying symptoms.    Past Medical History:  Diagnosis Date  . Abdominal hernia   . Adrenal insufficiency (HCC) 04/23/2013   ??  Marland Kitchen Anxiety   . Depression    "recent breakup with partner of 15 yrs"  . Depression   . GERD (gastroesophageal reflux disease)   . Hernia 03-24-13   ventral hernia remains   . MS (multiple sclerosis) (HCC)   . Pancreatic cyst 2002 onset  . Pancreatitis 03-24-13   2002, 2'2013, 03-14-13  . Ventral hernia     Patient Active Problem List   Diagnosis Date Noted  . PTSD (post-traumatic stress disorder) 10/05/2017  . Chronic pain syndrome 08/05/2017  . History of pancreatitis 06/09/2016  . Rectal bleeding 06/09/2016  . Gastroesophageal reflux disease with esophagitis 06/09/2016  . Right arm weakness   . Stroke-like symptoms   . Headache 11/29/2015  . Major depressive disorder, recurrent episode, moderate (HCC) 07/23/2015  . TIA (transient ischemic attack) 07/22/2015  . Vascular headache 07/22/2015  . Lupus (HCC) 07/22/2015  . Intractable chronic migraine without aura and without status migrainosus   . Conversion disorder, acute episode, with weakness or paralysis   . Systemic lupus erythematosus  related syndrome (HCC) 05/21/2015  . Stuttering   . UTI (lower urinary tract infection) 04/22/2015  . Fever   . Cephalalgia   . Right sided weakness   . Functional neurologic complaint   . Right sided weakness   . White matter changes   . Multiple sclerosis (HCC) 12/29/2014  . Right hemiparesis (HCC) 11/01/2014  . Lumbar radiculopathy 11/01/2014  . Low back pain 11/01/2014  . 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 Pancreatic pseudocyst 10/02/2014  . Nausea with vomiting 10/02/2014  . Nerve pain 09/25/2014  . Fever 04/01/2014  . Idiopathic acute pancreatitis 03/11/2014  . Nausea and vomiting 03/11/2014  . Abdominal pain, epigastric 03/11/2014  . Adjustment disorder with mixed anxiety and depressed mood 02/23/2014  . Thrush, oral 02/12/2014  . GERD (gastroesophageal reflux disease) 02/11/2014  . Abdominal pain 07/26/2013  . Nausea and vomiting in adult 07/26/2013  . Pancreatitis, recurrent 06/14/2013  . Low serum cortisol level (HCC) 04/25/2013  . ? Adrenal insufficiency- Work up negative 04/23/2013  . Chronic pancreatitis with chronic pseudocysts 04/23/2013  . Abnormal MRI 04/23/2013  . Persistent vomiting 04/22/2013  . Diarrhea 04/21/2013  . Anemia 04/20/2013  . Protein-calorie malnutrition, severe (HCC) 04/16/2013  . Obesity, morbid (HCC) 03/15/2013  . Pancreatic cyst 03/15/2013  . Acute inflammation of the pancreas 03/15/2013  . Cyst and pseudocyst of pancreas 03/15/2013  . Extreme obesity 03/15/2013    Past Surgical History:  Procedure Laterality Date  . ABDOMINAL SURGERY  ~  2007   some sort of pancreatic cyst drainage.   . ABDOMINAL SURGERY    . ADENOIDECTOMY    . CHOLECYSTECTOMY     ~ age 65-laparoscopic  . CHOLECYSTECTOMY    . EUS N/A 04/14/2013   Procedure: UPPER ENDOSCOPIC ULTRASOUND (EUS) LINEAR;  Surgeon: Rachael Fee, MD;  Location: [facility];  Service: Endoscopy;  Laterality: N/A;  . ROUX-EN-Y GASTRIC BYPASS      . ROUX-EN-Y PROCEDURE     stent to pancreatic cyst that became infected within 36 hours    Current Outpatient Rx  . Order #: [id number] Class: Normal  . Order #: [id number] Class: Normal  . Order #: [id number] Class: No Print  . Order #: [id number] Class: Normal  . Order #: [id number] Class: Historical Med  . Order #: [id number] Class: Normal  . Order #: [id number] Class: Normal  . Order #: [id number] Class: Normal  . Order #: [id number] Class: Normal  . Order #: [id number] Class: Normal  . Order #: [id number] Class: Normal  . Order #: [id number] Class: Historical Med  . Order #: [id number] Class: Normal  . Order #: [id number] Class: Print  . Order #: [id number] Class: Historical Med  . Order #: [id number] Class: Print  . Order #: [id number] Class: Print  . Order #: [id number] Class: No Print    Allergies Amoxicillin; Buprenorphine hcl; Latex; Morphine and related; Penicillins; Meat extract; Morphine and related; and Other  Family History  Problem Relation Age of Onset  . Diabetes Mother   . Diabetes Father   . Colitis Maternal Aunt   . Diabetes Unknown        many family members  . Lupus Neg Hx   . Sarcoidosis Neg Hx   . Pancreatitis Neg Hx   . Ataxia Neg Hx   . Chorea Neg Hx   . Dementia Neg Hx   . Mental retardation Neg Hx   . Migraines Neg Hx   . Multiple sclerosis Neg Hx   . Neurofibromatosis Neg Hx   . Neuropathy Neg Hx   . Parkinsonism Neg Hx   . Seizures Neg Hx   . Stroke Neg Hx   . Colon cancer Neg Hx     Social History Social History   Tobacco Use  . Smoking status: Former Smoker    Packs/day: 0.50    Years: 20.00    Pack years: 10.00    Types: Cigarettes    Last attempt to quit: 05/22/2013    Years since quitting: 4.6  . Smokeless tobacco: Never Used  Substance Use Topics  . Alcohol use: No    Alcohol/week: 0.0 oz  . Drug use: No    Review of Systems  All other systems negative except as documented in the HPI. All pertinent positives and negatives as reviewed in the  HPI. ____________________________________________   PHYSICAL EXAM:  VITAL SIGNS: ED Triage Vitals  Enc Vitals Group     BP 01/15/18 0527 133/87     Pulse Rate 01/15/18 0527 70     Resp 01/15/18 0527 20     Temp 01/15/18 0527 98.7 F (37.1 C)     Temp Source 01/15/18 0527 Oral     SpO2 01/15/18 0527 100 %    Constitutional: Alert and oriented. Well appearing and in no acute distress. Eyes: Conjunctivae are normal. PERRL. EOMI. Head: Atraumatic. Nose: No congestion/rhinnorhea. Mouth/Throat: Mucous membranes are moist.  Oropharynx non-erythematous. Neck: No stridor.  No meningeal signs.   Cardiovascular: Normal rate, regular rhythm. Good peripheral circulation. Grossly normal heart sounds.   Respiratory: Normal respiratory effort.  No  retractions. Lungs CTAB. Gastrointestinal: Soft and nontender. No distention.  Musculoskeletal: No lower extremity tenderness nor edema. No gross deformities of extremities. Neurologic:  Normal speech and language. No gross focal neurologic deficits are appreciated.  Skin:  Skin is warm, dry and intact. No rash noted.  ____________________________________________   LABS (all labs ordered are listed, but only abnormal results are displayed)  Labs Reviewed  COMPREHENSIVE METABOLIC PANEL - Abnormal; Notable for the following components:      Result Value   Total Protein 8.2 (*)    Alkaline Phosphatase 130 (*)    All other components within normal limits  CBC - Abnormal; Notable for the following components:   RDW 16.3 (*)    All other components within normal limits  URINALYSIS, ROUTINE W REFLEX MICROSCOPIC - Abnormal; Notable for the following components:   Leukocytes, UA TRACE (*)    Bacteria, UA RARE (*)    All other components within normal limits  LIPASE, BLOOD   ____________________________________________   RADIOLOGY  Ct Abdomen Pelvis W Contrast  Result Date: 01/15/2018 CLINICAL DATA:  42 year old female with progressive  abdominal pain since last night. Nausea vomiting. History of pancreatitis. EXAM: CT ABDOMEN AND PELVIS WITH CONTRAST TECHNIQUE: Multidetector CT imaging of the abdomen and pelvis was performed using the standard protocol following bolus administration of intravenous contrast. CONTRAST:  ISOVUE-300 IOPAMIDOL (ISOVUE-300) INJECTION 61% COMPARISON:  CT Abdomen and Pelvis 08/23/2016 and earlier. FINDINGS: Lower chest: Negative lung bases. No pericardial or pleural effusion. Hepatobiliary: Surgically absent gallbladder. Hepatic steatosis. No biliary ductal enlargement. Pancreas: Pancreatic atrophy with a mildly regressed cystic parenchymal lesion within the tail measuring 15 x 18 millimeters today (19 x 26 millimeters previously). No peripancreatic inflammation or lymphadenopathy. No other pancreatic lesion. Spleen: Negative. Adrenals/Urinary Tract: Normal adrenal glands. Symmetric and normal bilateral renal enhancement. No nephrolithiasis, hydronephrosis, or hydroureter. Negative course of both ureters. Diminutive and unremarkable urinary bladder. Stomach/Bowel: Decompressed rectum. Negative sigmoid colon aside from mild redundancy. Negative left colon, transverse colon and right colon aside from some retained stool. Negative terminal ileum. The cecum is on a lax mesentery. Normal appendix (series 2, image 64). No dilated small bowel. A mid abdominal small bowel anastomosis loop is decompressed today on series 2, image 45. Negative stomach and duodenum. No abdominal free air or free fluid. Vascular/Lymphatic: The major arterial structures in the abdomen and pelvis are patent without significant atherosclerosis. The portal venous system is patent. No lymphadenopathy. Reproductive: Within normal limits. Other: Sequelae of ventral abdominal hernia repair is stable. No pelvic free fluid. Musculoskeletal: No acute osseous abnormality identified. IMPRESSION: 1. No acute or inflammatory process identified in the abdomen  or pelvis. 2. No acute pancreatitis. Pancreatic atrophy with a mildly regressed benign appearing cystic parenchymal lesion in the tail, likely postinflammatory. 3. Hepatic steatosis. Electronically Signed   By: Odessa Fleming M.D.   On: 01/15/2018 11:51    ____________________________________________   PROCEDURES  Procedure(s) performed:   Procedures   ____________________________________________   INITIAL IMPRESSION / ASSESSMENT AND PLAN / ED COURSE  Pancreatitis versus reflux versus gastritis.  Will give pain medication, nausea medication and fluids at this time.  Will check labs.  Will reevaluate for indication for imaging.  Appears nontoxic and overall well at this time.  Workup negative. Pain improved. Tolerating PO. Stable for discharge.    Pertinent labs & imaging results that were available during my care of the patient were reviewed by me and considered in my medical decision making (see chart for  details).  ____________________________________________  FINAL CLINICAL IMPRESSION(S) / ED DIAGNOSES  Final diagnoses:  Vomiting, intractability of vomiting not specified, presence of nausea not specified, unspecified vomiting type  Epigastric pain     MEDICATIONS GIVEN DURING THIS VISIT:  Medications  HYDROmorphone (DILAUDID) injection 1 mg (has no administration in time range)  HYDROmorphone (DILAUDID) injection 1 mg (1 mg Intravenous Given 01/15/18 0806)  lactated ringers bolus 1,000 mL (0 mLs Intravenous Stopped 01/15/18 1114)  metoCLOPramide (REGLAN) injection 10 mg (10 mg Intravenous Given 01/15/18 0806)  HYDROmorphone (DILAUDID) injection 1 mg (1 mg Intravenous Given 01/15/18 1034)  iopamidol (ISOVUE-300) 61 % injection 100 mL (100 mLs Intravenous Contrast Given 01/15/18 1123)  gi cocktail (Maalox,Lidocaine,Donnatal) (30 mLs Oral Given 01/15/18 1207)  famotidine (PEPCID) tablet 20 mg (20 mg Oral Given 01/15/18 1207)     NEW OUTPATIENT MEDICATIONS STARTED DURING THIS  VISIT:  New Prescriptions   FAMOTIDINE (PEPCID) 20 MG TABLET    Take 1 tablet (20 mg total) by mouth 2 (two) times daily.   OMEPRAZOLE (PRILOSEC) 20 MG CAPSULE    Take 1 capsule (20 mg total) by mouth daily.    Note:  This note was prepared with assistance of Dragon voice recognition software. Occasional wrong-word or sound-a-like substitutions may have occurred due to the inherent limitations of voice recognition software.   Marily Memos, MD 01/15/18 [phone]

## 2018-01-15 NOTE — ED Notes (Signed)
Patient transported to CT 

## 2018-01-15 NOTE — ED Triage Notes (Signed)
Pt states she has a history of pancreatitis and started having abd pain on Thursday night that has progressively gotten worse since  Pt states she has had nausea and vomiting with a small amount of diarrhea

## 2018-02-10 ENCOUNTER — Other Ambulatory Visit: Payer: Self-pay | Admitting: Internal Medicine

## 2018-02-12 ENCOUNTER — Ambulatory Visit: Payer: Self-pay | Admitting: Psychology

## 2018-02-19 ENCOUNTER — Telehealth: Payer: Self-pay | Admitting: Internal Medicine

## 2018-02-22 ENCOUNTER — Other Ambulatory Visit: Payer: Self-pay | Admitting: Physical Medicine & Rehabilitation

## 2018-02-22 DIAGNOSIS — M329 Systemic lupus erythematosus, unspecified: Secondary | ICD-10-CM

## 2018-02-22 DIAGNOSIS — R531 Weakness: Secondary | ICD-10-CM

## 2018-02-22 DIAGNOSIS — G43719 Chronic migraine without aura, intractable, without status migrainosus: Secondary | ICD-10-CM

## 2018-02-22 DIAGNOSIS — G894 Chronic pain syndrome: Secondary | ICD-10-CM

## 2018-02-22 DIAGNOSIS — M5416 Radiculopathy, lumbar region: Secondary | ICD-10-CM

## 2018-02-23 ENCOUNTER — Encounter: Payer: Self-pay | Admitting: Physical Medicine & Rehabilitation

## 2018-02-23 ENCOUNTER — Encounter: Payer: Medicaid Other | Attending: Physical Medicine & Rehabilitation | Admitting: Physical Medicine & Rehabilitation

## 2018-02-23 VITALS — BP 121/81 | HR 77 | Resp 14 | Ht 64.0 in | Wt 265.0 lb

## 2018-02-23 DIAGNOSIS — Z79899 Other long term (current) drug therapy: Secondary | ICD-10-CM | POA: Diagnosis not present

## 2018-02-23 DIAGNOSIS — M329 Systemic lupus erythematosus, unspecified: Secondary | ICD-10-CM

## 2018-02-23 DIAGNOSIS — M545 Low back pain: Secondary | ICD-10-CM | POA: Insufficient documentation

## 2018-02-23 DIAGNOSIS — Z5181 Encounter for therapeutic drug level monitoring: Secondary | ICD-10-CM

## 2018-02-23 DIAGNOSIS — Z79891 Long term (current) use of opiate analgesic: Secondary | ICD-10-CM

## 2018-02-23 DIAGNOSIS — G35 Multiple sclerosis: Secondary | ICD-10-CM | POA: Diagnosis not present

## 2018-02-23 DIAGNOSIS — G43011 Migraine without aura, intractable, with status migrainosus: Secondary | ICD-10-CM | POA: Diagnosis not present

## 2018-02-23 DIAGNOSIS — G894 Chronic pain syndrome: Secondary | ICD-10-CM | POA: Insufficient documentation

## 2018-02-23 DIAGNOSIS — K861 Other chronic pancreatitis: Secondary | ICD-10-CM | POA: Diagnosis not present

## 2018-02-23 DIAGNOSIS — R531 Weakness: Secondary | ICD-10-CM

## 2018-02-23 MED ORDER — OXYCODONE HCL 10 MG PO TABS: 10.0000 mg | ORAL_TABLET | Freq: Four times a day (QID) | ORAL | 0 refills | Status: DC | PRN

## 2018-02-23 MED ORDER — ERENUMAB-AOOE 70 MG/ML ~~LOC~~ SOAJ: 70.0000 mg | SUBCUTANEOUS | 3 refills | Status: DC

## 2018-02-23 NOTE — Patient Instructions (Signed)
PLEASE FEEL FREE TO CALL OUR OFFICE WITH ANY PROBLEMS OR QUESTIONS ([phone])      

## 2018-02-23 NOTE — Progress Notes (Signed)
Subjective:    Patient ID: Mary [name], adult    DOB: 03/05/1976, 42 y.o.   MRN: [id number]  HPI   Mary [name] is here in follow up of her chronic pain and neurological/behavioral issues. Her last dissociative episode lasted about 3 weeks. Mom states that these episodes are typically triggered by overheating and physical/mental stress. She slowly comes back to normal over time without any specific "trigger" to improve. A severe migraine headache often precedes the episode as well. Her headaches remain daily with spikes at least 4-5 x per week. She remains on imitrex for breakthrough headaches with topamax for prophylaxis. There is typically not associated aura.  She is working on losing some weight through increased activity and dietary changes. She has noticed some improvement in her exercise capacity in general. She is still often sedentary at home, particularly when she's with her parents.   She remains on oxycodone for her general pain control.   Pain Inventory Average Pain 5 Pain Right Now 6 My pain is sharp, burning, stabbing, tingling and aching  In the last 24 hours, has pain interfered with the following? General activity 7 Relation with others 10 Enjoyment of life 3 What TIME of day is your pain at its worst? morning, night Sleep (in general) Fair  Pain is worse with: bending, standing and some activites Pain improves with: rest and medication Relief from Meds: 8  Mobility walk without assistance walk with assistance use a cane use a walker ability to climb steps?  yes do you drive?  no  Function disabled: date disabled .  Neuro/Psych tingling dizziness confusion depression anxiety  Prior Studies Any changes since last visit?  no  Physicians involved in your care Any changes since last visit?  no   Family History  Problem Relation Age of Onset  . Diabetes Mother   . Diabetes Father   . Colitis Maternal Aunt   . Diabetes Unknown        many family  members  . Lupus Neg Hx   . Sarcoidosis Neg Hx   . Pancreatitis Neg Hx   . Ataxia Neg Hx   . Chorea Neg Hx   . Dementia Neg Hx   . Mental retardation Neg Hx   . Migraines Neg Hx   . Multiple sclerosis Neg Hx   . Neurofibromatosis Neg Hx   . Neuropathy Neg Hx   . Parkinsonism Neg Hx   . Seizures Neg Hx   . Stroke Neg Hx   . Colon cancer Neg Hx    Social History   Socioeconomic History  . Marital status: Single    Spouse name: Not on file  . Number of children: 0  . Years of education: Not on file  . Highest education level: Not on file  Occupational History  . Not on file  Social Needs  . Financial resource strain: Not on file  . Food insecurity:    Worry: Not on file    Inability: Not on file  . Transportation needs:    Medical: Not on file    Non-medical: Not on file  Tobacco Use  . Smoking status: Former Smoker    Packs/day: 0.50    Years: 20.00    Pack years: 10.00    Types: Cigarettes    Last attempt to quit: 05/22/2013    Years since quitting: 4.7  . Smokeless tobacco: Never Used  Substance and Sexual Activity  . Alcohol use: No    Alcohol/week: 0.0  oz  . Drug use: No  . Sexual activity: Never    Birth control/protection: Abstinence  Lifestyle  . Physical activity:    Days per week: Not on file    Minutes per session: Not on file  . Stress: Not on file  Relationships  . Social connections:    Talks on phone: Not on file    Gets together: Not on file    Attends religious service: Not on file    Active member of club or organization: Not on file    Attends meetings of clubs or organizations: Not on file    Relationship status: Not on file  Other Topics Concern  . Not on file  Social History Narrative   ** Merged History Encounter **       Lives permanently in [place] with mom and stepfather.  Has not started working yet and was unemployed in [place].         Past Surgical History:  Procedure Laterality Date  . ABDOMINAL SURGERY  ~ 2007   some  sort of pancreatic cyst drainage.   . ABDOMINAL SURGERY    . ADENOIDECTOMY    . CHOLECYSTECTOMY     ~ age 43-laparoscopic  . CHOLECYSTECTOMY    . EUS N/A 04/14/2013   Procedure: UPPER ENDOSCOPIC ULTRASOUND (EUS) LINEAR;  Surgeon: Rachael Fee, MD;  Location: [facility];  Service: Endoscopy;  Laterality: N/A;  . ROUX-EN-Y GASTRIC BYPASS    . ROUX-EN-Y PROCEDURE     stent to pancreatic cyst that became infected within 36 hours   Past Medical History:  Diagnosis Date  . Abdominal hernia   . Adrenal insufficiency (HCC) 04/23/2013   ??  Marland Kitchen Anxiety   . Depression    "recent breakup with partner of 15 yrs"  . Depression   . GERD (gastroesophageal reflux disease)   . Hernia 03-24-13   ventral hernia remains   . MS (multiple sclerosis) (HCC)   . Pancreatic cyst 2002 onset  . Pancreatitis 03-24-13   2002, 2'2013, 03-14-13  . Ventral hernia    BP 121/81 (BP Location: Right Arm, Patient Position: Sitting, Cuff Size: Large)   Pulse 77   Resp 14   Ht 5\' 4"  (1.626 m)   Wt 265 lb (120.2 kg)   SpO2 96%   BMI 45.49 kg/m   Opioid Risk Score:   Fall Risk Score:  `1  Depression screen PHQ 2/9  Depression screen [facility] 2/9 12/28/2017 05/13/2017 04/09/2016 10/01/2015 05/21/2015 11/01/2014  Decreased Interest 1 3 2 2 2 2   Down, Depressed, Hopeless 1 3 2 2  - 2  PHQ - 2 Score 2 6 4 4 2 4   Altered sleeping 1 - - - - 1  Tired, decreased energy 1 - - - - 1  Change in appetite 1 - - - - 3  Feeling bad or failure about yourself  1 - - - - 0  Trouble concentrating 1 - - - - 1  Moving slowly or fidgety/restless 1 - - - - 0  Suicidal thoughts 0 - - - - -  PHQ-9 Score 8 - - - - 10  Difficult doing work/chores Somewhat difficult - - - - -    Review of Systems  Constitutional: Negative.   HENT: Negative.   Eyes: Negative.   Respiratory: Negative.   Cardiovascular: Positive for leg swelling.  Gastrointestinal: Positive for nausea.  Endocrine: Negative.   Genitourinary: Negative.   Musculoskeletal:  Positive for arthralgias, back pain, myalgias,  neck pain and neck stiffness.  Allergic/Immunologic: Negative.   Neurological: Positive for weakness and numbness.       Tingling  Psychiatric/Behavioral: Positive for confusion and dysphoric mood. The patient is nervous/anxious.   All other systems reviewed and are negative.      Objective:   Physical Exam  General: No acute distress HEENT: EOMI, oral membranes moist Cards: reg rate  Chest: normal effort Abdomen: Soft, NT, ND Skin: dry, intact Extremities: no edema Skin:Clean and intact without signs of breakdown  Musc: generalized low back tenderness with palpation Neuro:Moves all 4 extremities, gait stable, slightly wide based. CN exam non-focal.  Musculoskeletal: lumbar posture fair Psych: pt is alert, in good spirits. Remembers me. Improved insight and awareness.   Assessment & Plan:  1. Non-specific bicerebral/brainstem white matter disease---initially suspected to be MS, but not characteristic in presentation. LikelySLE.  -continued memory and balance deficits.  2. Chronic pain syndrome related to above with primarily neuropathic right sided pain. She has had a recent increase in cervical pain and related headaches over the last several months as well.  3. Low back pain with ? Radiculopathy RLE  4. Chronic pancreatitis  5. Conversion disorder/PTSD---at the core of many of her problems it appears 6. Chronic migraines/vascular headaches   Plan:  1.Continuetopamax 100mg  am and 100mg  qpm for headaches and neuropathic pain.  -maintainimitrex to 100mg  for breakthrough migraine -think she would be a good aimovig candidate. Her headaches are daily and unrelenting, with severe spikes in severity which are related to her dissociative episodes.  2. Continue psychiatry follow up. Many of her neurological symptoms are likely non-organic in nature given her recent spontaneous improvement and  relapse. Pt/family aware -continue with pamelor  25mg              -follow up with Dr. Kieth Brightly for further counseling next month.            -gaining further insight and traction in this area should prove helpful to her. 3. Oxycodone was refilled. #120 10mg  was refilled today. We will continue the controlled substance monitoring program, this consists of regular clinic visits, examinations, routine drug screening, pill counts as well as use of [place] Controlled Substance Reporting System. [place].  -UDS today  4.Insomnia: continue nortriptyline         -still needs sleep study. Emphasized to pt/mom that it's really important that this evaluated. Primary is apparently ordering the test 7. Rheumatology follow up as directed.  8. Follow up withme 2 months. were spent during this visit. All questions were encouraged and answered. Marland Kitchen

## 2018-02-26 ENCOUNTER — Encounter (HOSPITAL_BASED_OUTPATIENT_CLINIC_OR_DEPARTMENT_OTHER): Payer: Medicaid Other | Admitting: Psychology

## 2018-02-26 DIAGNOSIS — G894 Chronic pain syndrome: Secondary | ICD-10-CM

## 2018-02-26 DIAGNOSIS — G35 Multiple sclerosis: Secondary | ICD-10-CM | POA: Diagnosis not present

## 2018-02-26 DIAGNOSIS — F4310 Post-traumatic stress disorder, unspecified: Secondary | ICD-10-CM | POA: Diagnosis not present

## 2018-02-26 DIAGNOSIS — M329 Systemic lupus erythematosus, unspecified: Secondary | ICD-10-CM | POA: Diagnosis not present

## 2018-02-27 LAB — TOXASSURE SELECT,+ANTIDEPR,UR

## 2018-03-02 ENCOUNTER — Telehealth: Payer: Self-pay | Admitting: *Deleted

## 2018-03-02 NOTE — Telephone Encounter (Signed)
Urine drug screen for this encounter is consistent for prescribed medication 

## 2018-03-10 ENCOUNTER — Encounter: Payer: Self-pay | Admitting: Psychology
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
## 2018-03-12 ENCOUNTER — Encounter: Payer: Medicaid Other | Admitting: Psychology

## 2018-03-12 DIAGNOSIS — G35 Multiple sclerosis: Secondary | ICD-10-CM | POA: Diagnosis not present

## 2018-03-12 DIAGNOSIS — G894 Chronic pain syndrome: Secondary | ICD-10-CM | POA: Diagnosis not present

## 2018-03-12 DIAGNOSIS — M329 Systemic lupus erythematosus, unspecified: Secondary | ICD-10-CM | POA: Diagnosis not present

## 2018-03-12 DIAGNOSIS — F4310 Post-traumatic stress disorder, unspecified: Secondary | ICD-10-CM
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
## 2018-03-31 ENCOUNTER — Other Ambulatory Visit: Payer: Self-pay | Admitting: Physical Medicine & Rehabilitation

## 2018-03-31 DIAGNOSIS — M329 Systemic lupus erythematosus, unspecified: Secondary | ICD-10-CM

## 2018-03-31 DIAGNOSIS — G894 Chronic pain syndrome: Secondary | ICD-10-CM

## 2018-03-31 DIAGNOSIS — Z79899 Other long term (current) drug therapy: Secondary | ICD-10-CM

## 2018-03-31 DIAGNOSIS — Z5181 Encounter for therapeutic drug level monitoring: Secondary | ICD-10-CM

## 2018-03-31 DIAGNOSIS — G43719 Chronic migraine without aura, intractable, without status migrainosus: Secondary | ICD-10-CM

## 2018-03-31 DIAGNOSIS — R531 Weakness: Secondary | ICD-10-CM

## 2018-03-31 DIAGNOSIS — G35 Multiple sclerosis: Secondary | ICD-10-CM

## 2018-04-01 ENCOUNTER — Encounter: Payer: Medicaid Other | Attending: Physical Medicine & Rehabilitation | Admitting: Psychology

## 2018-04-01 DIAGNOSIS — G35 Multiple sclerosis: Secondary | ICD-10-CM | POA: Diagnosis not present

## 2018-04-01 DIAGNOSIS — G894 Chronic pain syndrome: Secondary | ICD-10-CM | POA: Insufficient documentation

## 2018-04-01 DIAGNOSIS — M329 Systemic lupus erythematosus, unspecified: Secondary | ICD-10-CM

## 2018-04-01 DIAGNOSIS — G43011 Migraine without aura, intractable, with status migrainosus: Secondary | ICD-10-CM | POA: Diagnosis not present

## 2018-04-01 DIAGNOSIS — K861 Other chronic pancreatitis: Secondary | ICD-10-CM | POA: Diagnosis not present

## 2018-04-01 DIAGNOSIS — F4310 Post-traumatic stress disorder, unspecified: Secondary | ICD-10-CM | POA: Diagnosis not present

## 2018-04-01 DIAGNOSIS — M545 Low back pain: Secondary | ICD-10-CM | POA: Diagnosis not present

## 2018-04-05 ENCOUNTER — Encounter (HOSPITAL_COMMUNITY): Payer: Self-pay | Admitting: Emergency Medicine

## 2018-04-05 ENCOUNTER — Emergency Department (HOSPITAL_COMMUNITY)
Admission: EM | Admit: 2018-04-05 | Discharge: 2018-04-05 | Disposition: A | Discharge status: Home / Self Care | Payer: Medicaid Other | Attending: Emergency Medicine | Admitting: Emergency Medicine

## 2018-04-05 DIAGNOSIS — Z7982 Long term (current) use of aspirin: Secondary | ICD-10-CM | POA: Insufficient documentation

## 2018-04-05 DIAGNOSIS — K859 Acute pancreatitis without necrosis or infection, unspecified: Secondary | ICD-10-CM | POA: Insufficient documentation

## 2018-04-05 DIAGNOSIS — R1013 Epigastric pain: Secondary | ICD-10-CM | POA: Diagnosis present

## 2018-04-05 DIAGNOSIS — Z91040 Latex allergy status: Secondary | ICD-10-CM | POA: Diagnosis not present

## 2018-04-05 DIAGNOSIS — K861 Other chronic pancreatitis: Secondary | ICD-10-CM | POA: Diagnosis not present

## 2018-04-05 DIAGNOSIS — Z79899 Other long term (current) drug therapy: Secondary | ICD-10-CM | POA: Insufficient documentation

## 2018-04-05 DIAGNOSIS — Z87891 Personal history of nicotine dependence: Secondary | ICD-10-CM | POA: Diagnosis not present

## 2018-04-05 LAB — COMPREHENSIVE METABOLIC PANEL
ALT: 20 U/L (ref 0–44)
AST: 20 U/L (ref 15–41)
Albumin: 4.1 g/dL (ref 3.5–5.0)
Alkaline Phosphatase: 137 U/L — ABNORMAL HIGH (ref 38–126)
Anion gap: 10 (ref 5–15)
BUN: 12 mg/dL (ref 6–20)
CHLORIDE: 111 mmol/L (ref 98–111)
CO2: 21 mmol/L — ABNORMAL LOW (ref 22–32)
Calcium: 8.5 mg/dL — ABNORMAL LOW (ref 8.9–10.3)
Creatinine, Ser: 0.64 mg/dL (ref 0.44–1.00)
GFR calc Af Amer: >60 mL/min (ref >60)
Glucose, Bld: 105 mg/dL — ABNORMAL HIGH (ref 70–99)
POTASSIUM: 3.6 mmol/L (ref 3.5–5.1)
Sodium: 142 mmol/L (ref 135–145)
Total Bilirubin: 0.5 mg/dL (ref 0.3–1.2)
Total Protein: 8.2 g/dL — ABNORMAL HIGH (ref 6.5–8.1)

## 2018-04-05 LAB — URINALYSIS, ROUTINE W REFLEX MICROSCOPIC
BILIRUBIN URINE: NEGATIVE
Glucose, UA: NEGATIVE mg/dL
Hgb urine dipstick: NEGATIVE
KETONES UR: NEGATIVE mg/dL
Nitrite: NEGATIVE
PH: 7 (ref 5.0–8.0)
Protein, ur: NEGATIVE mg/dL
Specific Gravity, Urine: 1.018 (ref 1.005–1.030)

## 2018-04-05 LAB — CBC
HEMATOCRIT: 39.7 % (ref 36.0–46.0)
HEMOGLOBIN: 12.7 g/dL (ref 12.0–15.0)
MCH: 27.7 pg (ref 26.0–34.0)
MCHC: 32 g/dL (ref 30.0–36.0)
MCV: 86.5 fL (ref 78.0–100.0)
Platelets: 361 10*3/uL (ref 150–400)
RBC: 4.59 MIL/uL (ref 3.87–5.11)
RDW: 15 % (ref 11.5–15.5)
WBC: 21.7 10*3/uL — AB (ref 4.0–10.5)

## 2018-04-05 LAB — I-STAT BETA HCG BLOOD, ED (MC, WL, AP ONLY)

## 2018-04-05 LAB — LIPASE, BLOOD: LIPASE: 20 U/L (ref 11–51)

## 2018-04-05 MED ORDER — SODIUM CHLORIDE 0.9 % IV BOLUS
1000.0000 mL | Freq: Once | INTRAVENOUS | Status: AC
  Administered 2018-04-05: 1000 mL via INTRAVENOUS

## 2018-04-05 MED ORDER — PROMETHAZINE HCL 25 MG RE SUPP: 25.0000 mg | Freq: Four times a day (QID) | RECTAL | 0 refills | Status: DC | PRN

## 2018-04-05 MED ORDER — HYDROMORPHONE HCL 1 MG/ML IJ SOLN
1.0000 mg | Freq: Once | INTRAMUSCULAR | Status: AC
  Administered 2018-04-05: 1 mg via INTRAVENOUS
  Filled 2018-04-05: qty 1

## 2018-04-05 MED ORDER — ONDANSETRON HCL 4 MG/2ML IJ SOLN
4.0000 mg | Freq: Once | INTRAMUSCULAR | Status: AC
  Administered 2018-04-05: 4 mg via INTRAVENOUS
  Filled 2018-04-05: qty 2

## 2018-04-05 MED ORDER — METOCLOPRAMIDE HCL 5 MG/ML IJ SOLN
10.0000 mg | Freq: Once | INTRAMUSCULAR | Status: AC
  Administered 2018-04-05: 10 mg via INTRAVENOUS
  Filled 2018-04-05: qty 2

## 2018-04-05 NOTE — ED Provider Notes (Signed)
[facility]-EMERGENCY DEPT Provider Note   CSN: [id number] Arrival date & time: 04/05/18  0945     History   Chief Complaint Chief Complaint  Patient presents with  . Abdominal Pain  . Emesis    HPI Mary [name] is a 42 y.o. adult.  HPI  42 year old female with a history of chronic pancreatitis for many years presents with recurrent pain, vomiting, and diarrhea.  Is having epigastric pain rating to her back.  Started around 6 AM.  Numerous vomiting and diarrhea episodes.  No fevers or chest pain.  She feels a little short of breath but thinks is due to the degree of pain.  No urinary symptoms.  She tried her Phenergan but is still vomiting and that usually is a signal for her to have to come into the hospital.  Pain is severe.  Past Medical History:  Diagnosis Date  . Abdominal hernia   . Adrenal insufficiency (HCC) 04/23/2013   ??  Marland Kitchen Anxiety   . Depression    "recent breakup with partner of 15 yrs"  . Depression   . GERD (gastroesophageal reflux disease)   . Hernia 03-24-13   ventral hernia remains   . MS (multiple sclerosis) (HCC)   . Pancreatic cyst 2002 onset  . Pancreatitis 03-24-13   2002, 2'2013, 03-14-13  . Ventral hernia     Patient Active Problem List   Diagnosis Date Noted  . PTSD (post-traumatic stress disorder) 10/05/2017  . Chronic pain syndrome 08/05/2017  . History of pancreatitis 06/09/2016  . Rectal bleeding 06/09/2016  . Gastroesophageal reflux disease with esophagitis 06/09/2016  . Right arm weakness   . Stroke-like symptoms   . Headache 11/29/2015  . Major depressive disorder, recurrent episode, moderate (HCC) 07/23/2015  . TIA (transient ischemic attack) 07/22/2015  . Vascular headache 07/22/2015  . Lupus (HCC) 07/22/2015  . Intractable chronic migraine without aura and without status migrainosus   . Conversion disorder, acute episode, with weakness or paralysis   . Systemic lupus erythematosus related syndrome (HCC)  05/21/2015  . Stuttering   . UTI (lower urinary tract infection) 04/22/2015  . Fever   . Cephalalgia   . Right sided weakness   . Functional neurologic complaint   . Right sided weakness   . White matter changes   . Multiple sclerosis (HCC) 12/29/2014  . Right hemiparesis (HCC) 11/01/2014  . Lumbar radiculopathy 11/01/2014  . Low back pain 11/01/2014  . 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 Pancreatic pseudocyst 10/02/2014  . Nausea with vomiting 10/02/2014  . Nerve pain 09/25/2014  . Fever 04/01/2014  . Idiopathic acute pancreatitis 03/11/2014  . Nausea and vomiting 03/11/2014  . Abdominal pain, epigastric 03/11/2014  . Adjustment disorder with mixed anxiety and depressed mood 02/23/2014  . Thrush, oral 02/12/2014  . GERD (gastroesophageal reflux disease) 02/11/2014  . Abdominal pain 07/26/2013  . Nausea and vomiting in adult 07/26/2013  . Pancreatitis, recurrent 06/14/2013  . Low serum cortisol level (HCC) 04/25/2013  . ? Adrenal insufficiency- Work up negative 04/23/2013  . Chronic pancreatitis with chronic pseudocysts 04/23/2013  . Abnormal MRI 04/23/2013  . Persistent vomiting 04/22/2013  . Diarrhea 04/21/2013  . Anemia 04/20/2013  . Protein-calorie malnutrition, severe (HCC) 04/16/2013  . Obesity, morbid (HCC) 03/15/2013  . Pancreatic cyst 03/15/2013  . Acute inflammation of the pancreas 03/15/2013  . Cyst and pseudocyst of pancreas 03/15/2013  . Extreme obesity 03/15/2013    Past Surgical History:  Procedure Laterality  Date  . ABDOMINAL SURGERY  ~ 2007   some sort of pancreatic cyst drainage.   . ABDOMINAL SURGERY    . ADENOIDECTOMY    . CHOLECYSTECTOMY     ~ age 30-laparoscopic  . CHOLECYSTECTOMY    . EUS N/A 04/14/2013   Procedure: UPPER ENDOSCOPIC ULTRASOUND (EUS) LINEAR;  Surgeon: Rachael Fee, MD;  Location: [facility] ENDOSCOPY;  Service: Endoscopy;  Laterality: N/A;  . ROUX-EN-Y GASTRIC BYPASS    . ROUX-EN-Y PROCEDURE      stent to pancreatic cyst that became infected within 36 hours     OB History    Gravida  0   Para  0   Term  0   Preterm  0   AB  0   Living        SAB  0   TAB  0   Ectopic  0   Multiple      Live Births               Home Medications    Prior to Admission medications   Medication Sig Start Date End Date Taking? Authorizing Provider  aspirin 81 MG chewable tablet Chew 1 tablet (81 mg total) by mouth daily. 04/25/15   Arvilla Market, DO  baclofen (LIORESAL) 10 MG tablet TAKE ONE TABLET BY MOUTH EVERY NIGHT AT BEDTIME AS NEEDED FOR MUSCLE SPASMS 02/23/18   Ranelle Oyster, MD  clonazePAM (KLONOPIN) 0.5 MG tablet Take 2 tablets (1 mg total) by mouth 2 (two) times daily. 06/04/16   Ranelle Oyster, MD  Erenumab-aooe (AIMOVIG) 70 MG/ML SOAJ Inject 70 mg into the skin every 30 (thirty) days. 02/23/18   Ranelle Oyster, MD  famotidine (PEPCID) 20 MG tablet Take 1 tablet (20 mg total) by mouth 2 (two) times daily. 01/15/18   Mesner, Barbara Cower, MD  hydrocortisone (ANUSOL-HC) 25 MG suppository Place 1 suppository (25 mg total) rectally at bedtime. Patient taking differently: Place 25 mg rectally at bedtime as needed for hemorrhoids or itching.  06/09/16   Zehr, Princella Pellegrini, PA-C  hydroxychloroquine (PLAQUENIL) 200 MG tablet Take 300 mg by mouth daily.     [provider]  lipase/protease/amylase (CREON-12/PANCREASE) 12000 UNITS CPEP capsule Take 2 capsules by mouth 3 (three) times daily with meals. Patient taking differently: Take 24,000 Units by mouth 3 (three) times daily with meals.  07/31/13   Catarina Hartshorn, MD  methocarbamol (ROBAXIN) 500 MG tablet Take 1 tablet (500 mg total) by mouth 2 (two) times daily. 11/02/17   Jones Bales, NP  nortriptyline (PAMELOR) 25 MG capsule Take 1 capsule (25 mg total) by mouth at bedtime. 12/28/17   Ranelle Oyster, MD  omeprazole (PRILOSEC) 20 MG capsule Take 1 capsule (20 mg total) by mouth daily. 01/15/18   Mesner,  Barbara Cower, MD  Oxycodone HCl 10 MG TABS Take 1 tablet (10 mg total) by mouth every 6 (six) hours as needed (severe pain). 02/23/18   Ranelle Oyster, MD  pantoprazole (PROTONIX) 40 MG tablet TAKE ONE TABLET BY MOUTH TWICE A DAY 02/12/18   Hilarie Fredrickson, MD  predniSONE (DELTASONE) 5 MG tablet Take 2 tablets (10 mg total) by mouth daily with breakfast. 01/15/18   Mesner, Barbara Cower, MD  promethazine (PHENERGAN) 25 MG tablet Take 1 tablet (25 mg total) by mouth daily as needed for nausea or vomiting. 10/21/16   Hilarie Fredrickson, MD  sertraline (ZOLOFT) 100 MG tablet Take 200 mg by mouth daily.  [provider]  SUMAtriptan (IMITREX) 100 MG tablet Take 1 tablet (100 mg total) by mouth every 2 (two) hours as needed for migraine. May repeat in 2 hours if headache persists or recurs. 08/05/17   Ranelle Oyster, MD  topiramate (TOPAMAX) 100 MG tablet TAKE 1 TABLET BY MOUTH TWO TIMES A DAY 04/01/18   Ranelle Oyster, MD  triamcinolone cream (KENALOG) 0.1 % Apply 1 application topically daily as needed (eczema).  02/23/14   [provider]    Family History Family History  Problem Relation Age of Onset  . Diabetes Mother   . Diabetes Father   . Colitis Maternal Aunt   . Diabetes Unknown        many family members  . Lupus Neg Hx   . Sarcoidosis Neg Hx   . Pancreatitis Neg Hx   . Ataxia Neg Hx   . Chorea Neg Hx   . Dementia Neg Hx   . Mental retardation Neg Hx   . Migraines Neg Hx   . Multiple sclerosis Neg Hx   . Neurofibromatosis Neg Hx   . Neuropathy Neg Hx   . Parkinsonism Neg Hx   . Seizures Neg Hx   . Stroke Neg Hx   . Colon cancer Neg Hx     Social History Social History   Tobacco Use  . Smoking status: Former Smoker    Packs/day: 0.50    Years: 20.00    Pack years: 10.00    Types: Cigarettes    Last attempt to quit: 05/22/2013    Years since quitting: 4.8  . Smokeless tobacco: Never Used  Substance Use Topics  . Alcohol use: No    Alcohol/week: 0.0 standard drinks    . Drug use: No     Allergies   Amoxicillin; Buprenorphine hcl; Latex; Morphine and related; Penicillins; Meat extract; Morphine and related; and Other   Review of Systems Review of Systems  Constitutional: Negative for fever.  Cardiovascular: Negative for chest pain.  Gastrointestinal: Positive for abdominal pain, diarrhea, nausea and vomiting.  Genitourinary: Negative for dysuria.  Musculoskeletal: Positive for back pain.  All other systems reviewed and are negative.    Physical Exam Updated Vital Signs BP (!) 144/96 (BP Location: Right Arm)   Pulse (!) 128   Temp 98.1 F (36.7 C) (Oral)   Resp 20   SpO2 97%   Physical Exam  Constitutional: She is oriented to person, place, and time. She appears well-developed and well-nourished. No distress.  obese  HENT:  Head: Normocephalic and atraumatic.  Right Ear: External ear normal.  Left Ear: External ear normal.  Nose: Nose normal.  Eyes: Right eye exhibits no discharge. Left eye exhibits no discharge.  Cardiovascular: Regular rhythm and normal heart sounds. Tachycardia present.  Pulmonary/Chest: Effort normal and breath sounds normal.  Abdominal: Soft. There is tenderness in the epigastric area and left upper quadrant.  Neurological: She is alert and oriented to person, place, and time.  Skin: Skin is warm and dry.  Nursing note and vitals reviewed.    ED Treatments / Results  Labs (all labs ordered are listed, but only abnormal results are displayed) Labs Reviewed  COMPREHENSIVE METABOLIC PANEL - Abnormal; Notable for the following components:      Result Value   CO2 21 (*)    Glucose, Bld 105 (*)    Calcium 8.5 (*)    Total Protein 8.2 (*)    Alkaline Phosphatase 137 (*)    All  other components within normal limits  CBC - Abnormal; Notable for the following components:   WBC 21.7 (*)    All other components within normal limits  URINALYSIS, ROUTINE W REFLEX MICROSCOPIC - Abnormal; Notable for the following  components:   Leukocytes, UA TRACE (*)    Bacteria, UA RARE (*)    All other components within normal limits  LIPASE, BLOOD  I-STAT BETA HCG BLOOD, ED ([facility], [facility], [facility] ONLY)    EKG None  Radiology No results found.  Procedures Procedures (including critical care time)  Medications Ordered in ED Medications  ondansetron (ZOFRAN) injection 4 mg (4 mg Intravenous Given 04/05/18 1106)  sodium chloride 0.9 % bolus 1,000 mL (1,000 mLs Intravenous New Bag/Given 04/05/18 1113)  HYDROmorphone (DILAUDID) injection 1 mg (1 mg Intravenous Given 04/05/18 1106)  HYDROmorphone (DILAUDID) injection 1 mg (1 mg Intravenous Given 04/05/18 1212)  HYDROmorphone (DILAUDID) injection 1 mg (1 mg Intravenous Given 04/05/18 1334)  metoCLOPramide (REGLAN) injection 10 mg (10 mg Intravenous Given 04/05/18 1334)     Initial Impression / Assessment and Plan / ED Course  I have reviewed the triage vital signs and the nursing notes.  Pertinent labs & imaging results that were available during my care of the patient were reviewed by me and considered in my medical decision making (see chart for details).     Patient presents with acute on chronic pancreatitis/upper abdominal pain.  Vital signs stable.  Pain has improved.  Typically takes about 3 doses per the patient.  No vomiting in the ED.  The patient is noted to have a significant WBC elevation.  I discussed this at length with patient, stepmom, and other family.  We discussed how patient is Artie had multiple CTs and may end up with more CTs in the future.  Given the symptoms started just a few hours ago and there has been no fever and this feels exactly like many other pancreatitis flares, we have decided through shared decision making to hold off on CT.  My suspicion for an abscess or other acute complication is low.  However we did discuss that the elevated WBC could be due to something more sinister which would result in possibly worsening disease and thus if any  symptoms were to worsen or not improve the need to come back to the ER for evaluation.  Patient understands.  Final Clinical Impressions(s) / ED Diagnoses   Final diagnoses:  Acute on chronic pancreatitis [facility])    ED Discharge Orders    None       Pricilla Loveless, MD 04/05/18 1415

## 2018-04-05 NOTE — ED Triage Notes (Signed)
Pt c/o abd pain with n/v/d that started this morning. Hx pancreatitis.

## 2018-04-05 NOTE — Discharge Instructions (Addendum)
Your abdominal pain worsens or recurs or you develop fever, vomiting, or any other new/concerning symptoms then return to the ER for evaluation.  Otherwise follow-up with your primary care physician.

## 2018-04-13 ENCOUNTER — Encounter: Payer: Medicaid Other | Admitting: Psychology

## 2018-04-13 ENCOUNTER — Encounter: Payer: Self-pay | Admitting: Psychology

## 2018-04-13 DIAGNOSIS — G35 Multiple sclerosis: Secondary | ICD-10-CM

## 2018-04-13 DIAGNOSIS — K861 Other chronic pancreatitis: Secondary | ICD-10-CM | POA: Diagnosis not present

## 2018-04-13 DIAGNOSIS — G894 Chronic pain syndrome: Secondary | ICD-10-CM

## 2018-04-13 DIAGNOSIS — M545 Low back pain: Secondary | ICD-10-CM | POA: Diagnosis not present

## 2018-04-13 DIAGNOSIS — F4310 Post-traumatic stress disorder, unspecified: Secondary | ICD-10-CM | POA: Diagnosis not present
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
## 2018-04-20 ENCOUNTER — Ambulatory Visit: Payer: Self-pay | Admitting: Physical Medicine & Rehabilitation

## 2018-04-28 ENCOUNTER — Other Ambulatory Visit: Payer: Self-pay

## 2018-04-28 ENCOUNTER — Encounter: Payer: Self-pay | Admitting: Physical Medicine & Rehabilitation

## 2018-04-28 ENCOUNTER — Encounter: Payer: Medicaid Other | Attending: Physical Medicine & Rehabilitation | Admitting: Physical Medicine & Rehabilitation

## 2018-04-28 VITALS — BP 117/84 | HR 84 | Ht 63.0 in | Wt 261.8 lb

## 2018-04-28 DIAGNOSIS — G43011 Migraine without aura, intractable, with status migrainosus: Secondary | ICD-10-CM

## 2018-04-28 DIAGNOSIS — R531 Weakness: Secondary | ICD-10-CM | POA: Diagnosis not present

## 2018-04-28 DIAGNOSIS — M329 Systemic lupus erythematosus, unspecified: Secondary | ICD-10-CM

## 2018-04-28 DIAGNOSIS — M545 Low back pain: Secondary | ICD-10-CM | POA: Insufficient documentation

## 2018-04-28 DIAGNOSIS — G35 Multiple sclerosis: Secondary | ICD-10-CM | POA: Insufficient documentation

## 2018-04-28 DIAGNOSIS — F4310 Post-traumatic stress disorder, unspecified: Secondary | ICD-10-CM

## 2018-04-28 DIAGNOSIS — K861 Other chronic pancreatitis: Secondary | ICD-10-CM | POA: Diagnosis not present

## 2018-04-28 DIAGNOSIS — Z5181 Encounter for therapeutic drug level monitoring: Secondary | ICD-10-CM

## 2018-04-28 DIAGNOSIS — G894 Chronic pain syndrome: Secondary | ICD-10-CM | POA: Insufficient documentation

## 2018-04-28 DIAGNOSIS — Z79899 Other long term (current) drug therapy: Secondary | ICD-10-CM

## 2018-04-28 MED ORDER — OXYCODONE HCL 10 MG PO TABS: 10.0000 mg | ORAL_TABLET | Freq: Four times a day (QID) | ORAL | 0 refills | Status: DC | PRN

## 2018-04-28 NOTE — Progress Notes (Signed)
Subjective:    Patient ID: Mary [name], adult    DOB: [birth year], 42 y.o.   MRN: [id number]  HPI   Mary [name] is here in follow-up of her chronic pain.  She was in the emergency room 3 weeks ago with abdominal pain and a flareup of her pancreatitis. Memory is an issue which interferes with her medications and her daily schedule. She missed some of her night time medications for a week which affected her day/night schedule.  Now she's back on schedule and is doing more around the house and [place] has been more interactive with family. She continues with Dr. Kieth Brightly working on coping skills/ PTSD.   The aimovig shots has improved her headachs immensely. Her headaches have improved by 50% and she hasn't had any major migraines since starting although there are some triggers (such as bleach in washing machine).      Pain Inventory Average Pain 7 Pain Right Now 5 My pain is sharp, burning, stabbing, tingling and aching  In the last 24 hours, has pain interfered with the following? General activity 7 Relation with others 5 Enjoyment of life 5 What TIME of day is your pain at its worst? morning Sleep (in general) Fair  Pain is worse with: some activites Pain improves with: medication Relief from Meds: 6  Mobility walk without assistance how many minutes can you walk? 30 ability to climb steps?  no Do you have any goals in this area?  no  Function disabled: date disabled n/a I need assistance with the following:  household duties and shopping  Neuro/Psych weakness numbness tingling spasms dizziness confusion depression anxiety  Prior Studies Any changes since last visit?  no  Physicians involved in your care Any changes since last visit?  no   Family History  Problem Relation Age of Onset  . Diabetes Mother   . Diabetes Father   . Colitis Maternal Aunt   . Diabetes Unknown        many family members  . Lupus Neg Hx   . Sarcoidosis Neg Hx   . Pancreatitis  Neg Hx   . Ataxia Neg Hx   . Chorea Neg Hx   . Dementia Neg Hx   . Mental retardation Neg Hx   . Migraines Neg Hx   . Multiple sclerosis Neg Hx   . Neurofibromatosis Neg Hx   . Neuropathy Neg Hx   . Parkinsonism Neg Hx   . Seizures Neg Hx   . Stroke Neg Hx   . Colon cancer Neg Hx    Social History   Socioeconomic History  . Marital status: Single    Spouse name: Not on file  . Number of children: 0  . Years of education: Not on file  . Highest education level: Not on file  Occupational History  . Not on file  Social Needs  . Financial resource strain: Not on file  . Food insecurity:    Worry: Not on file    Inability: Not on file  . Transportation needs:    Medical: Not on file    Non-medical: Not on file  Tobacco Use  . Smoking status: Former Smoker    Packs/day: 0.50    Years: 20.00    Pack years: 10.00    Types: Cigarettes    Last attempt to quit: 05/22/2013    Years since quitting: 4.9  . Smokeless tobacco: Never Used  Substance and Sexual Activity  . Alcohol use: No  Alcohol/week: 0.0 standard drinks  . Drug use: No  . Sexual activity: Never    Birth control/protection: Abstinence  Lifestyle  . Physical activity:    Days per week: Not on file    Minutes per session: Not on file  . Stress: Not on file  Relationships  . Social connections:    Talks on phone: Not on file    Gets together: Not on file    Attends religious service: Not on file    Active member of club or organization: Not on file    Attends meetings of clubs or organizations: Not on file    Relationship status: Not on file  Other Topics Concern  . Not on file  Social History Narrative   ** Merged History Encounter **       Lives permanently in [place] with mom and stepfather.  Has not started working yet and was unemployed in [place].         Past Surgical History:  Procedure Laterality Date  . ABDOMINAL SURGERY  ~ 2007   some sort of pancreatic cyst drainage.   . ABDOMINAL  SURGERY    . ADENOIDECTOMY    . CHOLECYSTECTOMY     ~ age 58-laparoscopic  . CHOLECYSTECTOMY    . EUS N/A 04/14/2013   Procedure: UPPER ENDOSCOPIC ULTRASOUND (EUS) LINEAR;  Surgeon: Rachael Fee, MD;  Location: [facility];  Service: Endoscopy;  Laterality: N/A;  . ROUX-EN-Y GASTRIC BYPASS    . ROUX-EN-Y PROCEDURE     stent to pancreatic cyst that became infected within 36 hours   Past Medical History:  Diagnosis Date  . Abdominal hernia   . Adrenal insufficiency (HCC) 04/23/2013   ??  Marland Kitchen Anxiety   . Depression    "recent breakup with partner of 15 yrs"  . Depression   . GERD (gastroesophageal reflux disease)   . Hernia 03-24-13   ventral hernia remains   . MS (multiple sclerosis) (HCC)   . Pancreatic cyst 2002 onset  . Pancreatitis 03-24-13   2002, 2'2013, 03-14-13  . Ventral hernia    BP 117/84   Pulse 84   Ht 5\' 3"  (1.6 m)   Wt 261 lb 12.8 oz (118.8 kg)   SpO2 97%   BMI 46.38 kg/m   Opioid Risk Score:   Fall Risk Score:  `1  Depression screen PHQ 2/9  Depression screen [facility] 2/9 04/28/2018 12/28/2017 05/13/2017 04/09/2016 10/01/2015 05/21/2015 11/01/2014  Decreased Interest 1 1 3 2 2 2 2   Down, Depressed, Hopeless 1 1 3 2 2  - 2  PHQ - 2 Score 2 2 6 4 4 2 4   Altered sleeping - 1 - - - - 1  Tired, decreased energy - 1 - - - - 1  Change in appetite - 1 - - - - 3  Feeling bad or failure about yourself  - 1 - - - - 0  Trouble concentrating - 1 - - - - 1  Moving slowly or fidgety/restless - 1 - - - - 0  Suicidal thoughts - 0 - - - - -  PHQ-9 Score - 8 - - - - 10  Difficult doing work/chores - Somewhat difficult - - - - -     Review of Systems  Constitutional: Negative.   HENT: Negative.   Eyes: Negative.   Respiratory: Negative.   Cardiovascular: Negative.   Gastrointestinal: Positive for abdominal pain, nausea and vomiting.  Endocrine: Negative.   Genitourinary: Negative.   Musculoskeletal:  Negative.   Skin: Negative.   Allergic/Immunologic: Negative.     Neurological: Negative.   Hematological: Negative.   Psychiatric/Behavioral: Negative.   All other systems reviewed and are negative.      Objective:   Physical Exam  General: No acute distress HEENT: EOMI, oral membranes moist Cards: reg rate  Chest: normal effort Abdomen: Soft, NT, ND Skin: dry, intact Extremities: no edema Musc: generalized low back tenderness with palpation Neuro:Moves all 4 extremities.  Did not perform a focal neuro exam today.  Gait is wide-based but stable Psych: Patient is alert and appropriate.  Improved insight and awareness.  Assessment & Plan:  1. Non-specific bicerebral/brainstem white matter disease---initially suspected to be MS, but not characteristic in presentation. LikelySLE.  -continued memory and balance deficits.  2. Chronic pain syndrome related to above with primarily neuropathic right sided pain. She has had a recent increase in cervical pain and related headaches over the last several months as well.  3. Low back pain with ? Radiculopathy RLE  4. Chronic pancreatitis  5. Conversion disorder/PTSD---at the core of many of her problems it appears 6. Chronic migraines/vascular headaches   Plan:  1.Continuetopamax 100mg  am and 100mg  qpm for headaches and neuropathic pain.  -maintainimitrex to 100mg  for breakthrough migraine -good results with aimovig.   Continue with current dosing 2. Continue psychiatry follow up. Many of her neurological symptoms are likely non-organic in nature given her recent spontaneous improvement and relapse. Pt/family aware -continue with pamelor  25mg  -follow up with Dr. Kieth Brightly ongoing.  Seems to me making progress in this area  3. Oxycodone was refilled. #120 10mg  was refilled today.We will continue the controlled substance monitoring program, this consists of regular clinic visits, examinations, routine drug screening,  pill counts as well as use of [place] Controlled Substance Reporting System. [place] today.   4. Insomnia:continue nortriptyline -Patient has appointment with neurology tomorrow/assessment for sleep study 7. Rheumatology follow up as directed.  8. Follow up withme59months. were spent during this visit. All questions were encouraged and answered. Marland Kitchen

## 2018-04-28 NOTE — Patient Instructions (Signed)
PLEASE FEEL FREE TO CALL OUR OFFICE WITH ANY PROBLEMS OR QUESTIONS ([phone])      

## 2018-04-29 ENCOUNTER — Encounter: Payer: Self-pay | Admitting: Psychology

## 2018-04-29 ENCOUNTER — Encounter: Payer: Self-pay | Admitting: Neurology

## 2018-04-29 ENCOUNTER — Encounter: Payer: Medicaid Other | Admitting: Psychology

## 2018-04-29 ENCOUNTER — Ambulatory Visit: Payer: Medicaid Other | Admitting: Neurology

## 2018-04-29 VITALS — BP 98/67 | HR 83 | Ht 63.0 in | Wt 262.0 lb

## 2018-04-29 DIAGNOSIS — G4710 Hypersomnia, unspecified: Secondary | ICD-10-CM

## 2018-04-29 DIAGNOSIS — G4730 Sleep apnea, unspecified: Secondary | ICD-10-CM

## 2018-04-29 DIAGNOSIS — K862 Cyst of pancreas: Secondary | ICD-10-CM | POA: Diagnosis not present

## 2018-04-29 DIAGNOSIS — E43 Unspecified severe protein-calorie malnutrition: Secondary | ICD-10-CM

## 2018-04-29 DIAGNOSIS — G43011 Migraine without aura, intractable, with status migrainosus: Secondary | ICD-10-CM | POA: Diagnosis not present

## 2018-04-29 DIAGNOSIS — Z6841 Body Mass Index (BMI) 40.0 and over, adult: Secondary | ICD-10-CM

## 2018-04-29 DIAGNOSIS — G4750 Parasomnia, unspecified: Secondary | ICD-10-CM

## 2018-04-29 DIAGNOSIS — F4310 Post-traumatic stress disorder, unspecified: Secondary | ICD-10-CM | POA: Diagnosis not present

## 2018-04-29 DIAGNOSIS — E66813 Obesity, class 3: Secondary | ICD-10-CM

## 2018-04-29 DIAGNOSIS — E2740 Unspecified adrenocortical insufficiency: Secondary | ICD-10-CM | POA: Diagnosis not present

## 2018-04-29 DIAGNOSIS — R0683 Snoring: Secondary | ICD-10-CM

## 2018-04-29 DIAGNOSIS — G35 Multiple sclerosis: Secondary | ICD-10-CM

## 2018-04-29 DIAGNOSIS — R531 Weakness: Secondary | ICD-10-CM

## 2018-04-29 DIAGNOSIS — R9389 Abnormal findings on diagnostic imaging of other specified body structures: Secondary | ICD-10-CM

## 2018-04-29 DIAGNOSIS — G894 Chronic pain syndrome: Secondary | ICD-10-CM | POA: Diagnosis not present

## 2018-04-29 DIAGNOSIS — F5104 Psychophysiologic insomnia: Secondary | ICD-10-CM
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
## 2018-04-29 NOTE — Patient Instructions (Signed)
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
## 2018-05-03 ENCOUNTER — Encounter: Payer: Self-pay | Admitting: Psychology
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
## 2018-05-13 ENCOUNTER — Encounter: Payer: Medicaid Other | Admitting: Psychology

## 2018-05-13 ENCOUNTER — Encounter: Payer: Self-pay | Admitting: Psychology

## 2018-05-13 DIAGNOSIS — R531 Weakness: Secondary | ICD-10-CM

## 2018-05-13 DIAGNOSIS — G35 Multiple sclerosis: Secondary | ICD-10-CM

## 2018-05-13 DIAGNOSIS — F4310 Post-traumatic stress disorder, unspecified: Secondary | ICD-10-CM | POA: Diagnosis not present

## 2018-05-13 DIAGNOSIS — G894 Chronic pain syndrome: Secondary | ICD-10-CM

## 2018-05-13 DIAGNOSIS — G43011 Migraine without aura, intractable, with status migrainosus: Secondary | ICD-10-CM
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
## 2018-05-15 ENCOUNTER — Other Ambulatory Visit: Payer: Self-pay | Admitting: Internal Medicine

## 2018-05-15 ENCOUNTER — Other Ambulatory Visit: Payer: Self-pay | Admitting: Physical Medicine & Rehabilitation

## 2018-05-15 DIAGNOSIS — G894 Chronic pain syndrome: Secondary | ICD-10-CM

## 2018-05-19 ENCOUNTER — Other Ambulatory Visit: Payer: Self-pay | Admitting: Internal Medicine

## 2018-05-27 ENCOUNTER — Encounter: Payer: Medicaid Other | Attending: Physical Medicine & Rehabilitation | Admitting: Psychology

## 2018-05-27 DIAGNOSIS — F4310 Post-traumatic stress disorder, unspecified: Secondary | ICD-10-CM | POA: Diagnosis not present

## 2018-05-27 DIAGNOSIS — M545 Low back pain: Secondary | ICD-10-CM | POA: Diagnosis not present

## 2018-05-27 DIAGNOSIS — G35 Multiple sclerosis: Secondary | ICD-10-CM | POA: Diagnosis not present

## 2018-05-27 DIAGNOSIS — G894 Chronic pain syndrome: Secondary | ICD-10-CM | POA: Diagnosis not present

## 2018-05-27 DIAGNOSIS — R531 Weakness: Secondary | ICD-10-CM | POA: Diagnosis not present

## 2018-05-27 DIAGNOSIS — K861 Other chronic pancreatitis: Secondary | ICD-10-CM | POA: Insufficient documentation

## 2018-05-27 DIAGNOSIS — G43011 Migraine without aura, intractable, with status migrainosus: Secondary | ICD-10-CM

## 2018-06-02 ENCOUNTER — Encounter: Payer: Self-pay | Admitting: Psychology
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
## 2018-06-09 ENCOUNTER — Telehealth: Payer: Self-pay | Admitting: Neurology

## 2018-06-09 NOTE — Telephone Encounter (Signed)
We have attempted to call the patient 2 times to schedule sleep study. Patient has been unavailable at the phone numbers we have on file and has not returned our calls. At this point we will send a letter asking pt to please contact the sleep lab to schedule their sleep study. If patient calls back we will schedule them for their sleep study. ° °

## 2018-06-10 ENCOUNTER — Encounter: Payer: Medicaid Other | Admitting: Psychology

## 2018-06-10 DIAGNOSIS — F4310 Post-traumatic stress disorder, unspecified: Secondary | ICD-10-CM

## 2018-06-10 DIAGNOSIS — G35 Multiple sclerosis: Secondary | ICD-10-CM

## 2018-06-10 DIAGNOSIS — G894 Chronic pain syndrome: Secondary | ICD-10-CM | POA: Diagnosis not present

## 2018-06-10 DIAGNOSIS — G43011 Migraine without aura, intractable, with status migrainosus: Secondary | ICD-10-CM

## 2018-06-10 DIAGNOSIS — R531 Weakness: Secondary | ICD-10-CM | POA: Diagnosis not present

## 2018-06-11 ENCOUNTER — Encounter: Payer: Self-pay | Admitting: Psychology
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
## 2018-06-24 ENCOUNTER — Ambulatory Visit: Payer: Self-pay | Admitting: Psychology

## 2018-06-28 ENCOUNTER — Other Ambulatory Visit: Payer: Self-pay | Admitting: Physical Medicine & Rehabilitation

## 2018-06-28 DIAGNOSIS — G43719 Chronic migraine without aura, intractable, without status migrainosus: Secondary | ICD-10-CM

## 2018-06-28 DIAGNOSIS — R531 Weakness: Secondary | ICD-10-CM

## 2018-06-28 DIAGNOSIS — M329 Systemic lupus erythematosus, unspecified: Secondary | ICD-10-CM

## 2018-06-28 DIAGNOSIS — M5416 Radiculopathy, lumbar region: Secondary | ICD-10-CM

## 2018-06-28 DIAGNOSIS — G894 Chronic pain syndrome: Secondary | ICD-10-CM

## 2018-06-29 ENCOUNTER — Encounter: Payer: Self-pay | Admitting: Physical Medicine & Rehabilitation

## 2018-06-29 ENCOUNTER — Other Ambulatory Visit: Payer: Self-pay

## 2018-06-29 ENCOUNTER — Encounter: Payer: Medicaid Other | Attending: Physical Medicine & Rehabilitation | Admitting: Physical Medicine & Rehabilitation

## 2018-06-29 VITALS — BP 104/71 | HR 91 | Ht 63.0 in | Wt 264.4 lb

## 2018-06-29 DIAGNOSIS — Z79899 Other long term (current) drug therapy: Secondary | ICD-10-CM

## 2018-06-29 DIAGNOSIS — G894 Chronic pain syndrome: Secondary | ICD-10-CM | POA: Diagnosis present

## 2018-06-29 DIAGNOSIS — G43011 Migraine without aura, intractable, with status migrainosus: Secondary | ICD-10-CM

## 2018-06-29 DIAGNOSIS — K861 Other chronic pancreatitis: Secondary | ICD-10-CM | POA: Diagnosis not present

## 2018-06-29 DIAGNOSIS — Z5181 Encounter for therapeutic drug level monitoring: Secondary | ICD-10-CM

## 2018-06-29 DIAGNOSIS — G35 Multiple sclerosis: Secondary | ICD-10-CM | POA: Diagnosis not present

## 2018-06-29 DIAGNOSIS — M545 Low back pain: Secondary | ICD-10-CM | POA: Insufficient documentation

## 2018-06-29 DIAGNOSIS — R531 Weakness: Secondary | ICD-10-CM

## 2018-06-29 DIAGNOSIS — M329 Systemic lupus erythematosus, unspecified: Secondary | ICD-10-CM

## 2018-06-29 DIAGNOSIS — F4310 Post-traumatic stress disorder, unspecified: Secondary | ICD-10-CM | POA: Diagnosis not present

## 2018-06-29 MED ORDER — OXYCODONE HCL 10 MG PO TABS: 10.0000 mg | ORAL_TABLET | Freq: Four times a day (QID) | ORAL | 0 refills | Status: DC | PRN

## 2018-06-29 NOTE — Progress Notes (Deleted)
  Subjective:     Patient ID: Mary [name], adult   DOB: 27-Aug-1975, 42 y.o.   MRN: [id number]  HPI   Review of Systems     Objective:   Physical Exam     Assessment:     ***    Plan:     ***

## 2018-06-29 NOTE — Patient Instructions (Signed)
WORK ON BETTER SLEEP HYGIENE:  1. KEEP A MORE CONSISTENT TIME TO GO TO BED  2. DECREASE DISTRACTIONS WHICH MIGHT KEEP YOU AWAKE  3.  TRY SOME BACKGROUND MUSIC OR READING TO HELP YOU FALL ASLEEP  4. USE MEDITATION AND MINDFULNESS TECHNIQUES TO HELP GET YOUR MIND OFF PAIN.

## 2018-06-29 NOTE — Progress Notes (Signed)
Subjective:    Patient ID: Mary [name], adult    DOB: 17-Oct-1975, 42 y.o.   MRN: [id number]  HPI   Mary [name] is here in follow-up of her chronic pain.  I last saw her in September.  She is continued to follow-up with Dr. Kieth Brightly for her PTSD and psychiatric breaks.  She demonstrated significant impairments and associated features at the October 10 visit with Dr. Kieth Brightly but was showing some improvement as of the October 24 encounter.   For pain she is taking oxycodone 10 mg every 6 hours as needed.  She also remains on Aimovig for headache prophylaxis which has helped intensity and regularity.   She complains of ongoing difficulties with sleep.  She has difficulty getting settled at night.  Often pain is inhibiting factor.  She is seen neurology regarding her sleep dysfunction and an EEG and sleep study have been ordered.     Pain Inventory Average Pain 7 Pain Right Now 8 My pain is constant, sharp, burning, tingling and aching  In the last 24 hours, has pain interfered with the following? General activity 8 Relation with others 6 Enjoyment of life 8 What TIME of day is your pain at its worst? morning evening Sleep (in general) Poor  Pain is worse with: walking, bending and some activites Pain improves with: rest and medication Relief from Meds: 4  Mobility how many minutes can you walk? 10 ability to climb steps?  yes do you drive?  no  Function disabled: date disabled n/a I need assistance with the following:  household duties and shopping  Neuro/Psych weakness numbness tremor tingling spasms dizziness confusion depression anxiety  Prior Studies Any changes since last visit?  no  Physicians involved in your care Any changes since last visit?  no   Family History  Problem Relation Age of Onset  . Diabetes Mother   . Hypertension Mother   . Cervical cancer Mother   . Heart disease Mother   . Diabetes Father   . Hypertension Father   . Heart  disease Father   . Colitis Maternal Aunt   . Diabetes Unknown        many family members  . CVA Maternal Grandmother   . Lupus Neg Hx   . Sarcoidosis Neg Hx   . Pancreatitis Neg Hx   . Ataxia Neg Hx   . Chorea Neg Hx   . Dementia Neg Hx   . Mental retardation Neg Hx   . Migraines Neg Hx   . Multiple sclerosis Neg Hx   . Neurofibromatosis Neg Hx   . Neuropathy Neg Hx   . Parkinsonism Neg Hx   . Seizures Neg Hx   . Stroke Neg Hx   . Colon cancer Neg Hx    Social History   Socioeconomic History  . Marital status: Single    Spouse name: Not on file  . Number of children: 0  . Years of education: Not on file  . Highest education level: Not on file  Occupational History  . Not on file  Social Needs  . Financial resource strain: Not on file  . Food insecurity:    Worry: Not on file    Inability: Not on file  . Transportation needs:    Medical: Not on file    Non-medical: Not on file  Tobacco Use  . Smoking status: Former Smoker    Packs/day: 0.50    Years: 20.00    Pack years: 10.00  Types: Cigarettes    Last attempt to quit: 05/22/2013    Years since quitting: 5.1  . Smokeless tobacco: Never Used  Substance and Sexual Activity  . Alcohol use: No    Alcohol/week: 0.0 standard drinks  . Drug use: No  . Sexual activity: Never    Birth control/protection: Abstinence  Lifestyle  . Physical activity:    Days per week: Not on file    Minutes per session: Not on file  . Stress: Not on file  Relationships  . Social connections:    Talks on phone: Not on file    Gets together: Not on file    Attends religious service: Not on file    Active member of club or organization: Not on file    Attends meetings of clubs or organizations: Not on file    Relationship status: Not on file  Other Topics Concern  . Not on file  Social History Narrative   ** Merged History Encounter **       Lives permanently in [place] with mom and stepfather.  Has not started working yet and was  unemployed in [place].         Past Surgical History:  Procedure Laterality Date  . ABDOMINAL SURGERY  ~ 2007   some sort of pancreatic cyst drainage.   . ABDOMINAL SURGERY    . ADENOIDECTOMY    . CHOLECYSTECTOMY     ~ age 68-laparoscopic  . CHOLECYSTECTOMY    . EUS N/A 04/14/2013   Procedure: UPPER ENDOSCOPIC ULTRASOUND (EUS) LINEAR;  Surgeon: Rachael Fee, MD;  Location: [facility];  Service: Endoscopy;  Laterality: N/A;  . HERNIA REPAIR  2017  . ROUX-EN-Y GASTRIC BYPASS    . ROUX-EN-Y PROCEDURE     stent to pancreatic cyst that became infected within 36 hours   Past Medical History:  Diagnosis Date  . Abdominal hernia   . Adrenal insufficiency (HCC) 04/23/2013   ??  Marland Kitchen Anxiety   . Depression    "recent breakup with partner of 15 yrs"  . Depression   . GERD (gastroesophageal reflux disease)   . Hernia 03-24-13   ventral hernia remains   . Lupus (HCC)   . MS (multiple sclerosis) (HCC)   . 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 Pancreatic cyst 2002 onset  . Pancreatitis 03-24-13   2002, 2'2013, 03-14-13  . PTSD (post-traumatic stress disorder)   . Ventral hernia    BP 104/71   Pulse 91   Ht 5\' 3"  (1.6 m)   Wt 264 lb 6.4 oz (119.9 kg)   SpO2 91%   BMI 46.84 kg/m   Opioid Risk Score:   Fall Risk Score:  `1  Depression screen PHQ 2/9  Depression screen [facility] 2/9 06/29/2018 04/28/2018 12/28/2017 05/13/2017 04/09/2016 10/01/2015 05/21/2015  Decreased Interest 1 1 1 3 2 2 2   Down, Depressed, Hopeless 1 1 1 3 2 2  -  PHQ - 2 Score 2 2 2 6 4 4 2   Altered sleeping - - 1 - - - -  Tired, decreased energy - - 1 - - - -  Change in appetite - - 1 - - - -  Feeling bad or failure about yourself  - - 1 - - - -  Trouble concentrating - - 1 - - - -  Moving slowly or fidgety/restless - - 1 - - - -  Suicidal thoughts - - 0 - - - -  PHQ-9 Score - -  8 - - - -  Difficult doing work/chores - - Somewhat difficult - - - -    Review of Systems    Constitutional: Positive for unexpected weight change.  HENT: Negative.   Eyes: Negative.   Respiratory: Positive for cough and shortness of breath.   Cardiovascular: Positive for leg swelling.  Gastrointestinal: Positive for abdominal pain, nausea and vomiting.  Endocrine: Negative.   Genitourinary: Negative.   Musculoskeletal: Negative.   Skin: Negative.   Allergic/Immunologic: Negative.   Neurological: Negative.   Hematological: Negative.   Psychiatric/Behavioral: Negative.   All other systems reviewed and are negative.      Objective:   Physical Exam General: No acute distress HEENT: EOMI, oral membranes moist Cards: reg rate  Chest: normal effort Abdomen: Soft, NT, ND Skin: dry, intact Extremities: no edema   Musc: generalized low back tenderness with palpation Neuro:Motor grossly 4-5 out of 5. Did not perform a focal neuro exam today.  Gait is wide-based but stable.  Good insight and awareness.  Has functional memory today. Psych:  Cognitively appropriate today.  Very pleasant and cooperative.  Assessment & Plan:  1. Non-specific bicerebral/brainstem white matter disease---initially suspected to be MS, but not characteristic in presentation. LikelySLE.  -continued memory and balance deficits.  2. Chronic pain syndrome related to above with primarily neuropathic right sided pain. She has had a recent increase in cervical pain and related headaches over the last several months as well.  3. Low back pain with ? Radiculopathy RLE  4. Chronic pancreatitis  5. Conversion disorder/PTSD---at the core of many of her problems it appears 6. Chronic migraines/vascular headaches   Plan:  1.Continuetopamax 100mg  am and 100mg  qpm for headaches and neuropathic pain.  -maintainimitrex to 100mg  for breakthrough migraine -good results with aimovig.    Consider increase to 140 mg monthly in the new year 2. Continue psychiatry follow up.  Many of her neurological symptoms are likely non-organic in nature given her recent spontaneous improvement and relapse. Pt/family aware -continue with pamelor 25mg  -follow up with Dr. Kieth Brightly ongoing.  Seems to me making progress in this area   -Discussed triggers for psychiatric breaks which are physical and emotional stress.  Needs to be better about identifying needs and being able to de-escalate when the stress comes on.  3. Oxycodone was refilled. #120 10mg  was refilled today.We will continue the controlled substance monitoring program, this consists of regular clinic visits, examinations, routine drug screening, pill counts as well as use of [place] Controlled Substance Reporting System. [place] today.  4. Insomnia:continue nortriptyline -Medication was refilled and a second prescription was sent to the patient's pharmacy for next month.   4.  Sleep hygiene: Spent extensive time discussing better sleep habits.  A basic list of recommendations was provided to the patient in this regard today.  I am not anxious to start her on additional medication.  -Sleep study and management per neurology. 7. Rheumatology follow up as directed.  8. Follow up withme72months. were spent during this visit. All questions were encouraged and answered. Marland Kitchen

## 2018-07-08 ENCOUNTER — Encounter: Payer: Medicaid Other | Admitting: Psychology

## 2018-07-08 DIAGNOSIS — G43011 Migraine without aura, intractable, with status migrainosus: Secondary | ICD-10-CM | POA: Diagnosis not present

## 2018-07-08 DIAGNOSIS — G894 Chronic pain syndrome: Secondary | ICD-10-CM | POA: Diagnosis not present

## 2018-07-08 DIAGNOSIS — M329 Systemic lupus erythematosus, unspecified: Secondary | ICD-10-CM

## 2018-07-08 DIAGNOSIS — F4310 Post-traumatic stress disorder, unspecified: Secondary | ICD-10-CM

## 2018-07-11 ENCOUNTER — Encounter: Payer: Self-pay | Admitting: Psychology
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
## 2018-07-14 ENCOUNTER — Other Ambulatory Visit: Payer: Self-pay | Admitting: Physical Medicine & Rehabilitation

## 2018-07-14 DIAGNOSIS — G894 Chronic pain syndrome: Secondary | ICD-10-CM

## 2018-08-14 ENCOUNTER — Other Ambulatory Visit: Payer: Self-pay | Admitting: Internal Medicine

## 2018-08-14 ENCOUNTER — Other Ambulatory Visit: Payer: Self-pay | Admitting: Physical Medicine & Rehabilitation

## 2018-08-14 DIAGNOSIS — M329 Systemic lupus erythematosus, unspecified: Secondary | ICD-10-CM

## 2018-08-14 DIAGNOSIS — R531 Weakness: Secondary | ICD-10-CM

## 2018-08-14 DIAGNOSIS — G35 Multiple sclerosis: Secondary | ICD-10-CM

## 2018-08-29 ENCOUNTER — Other Ambulatory Visit: Payer: Self-pay | Admitting: Physical Medicine & Rehabilitation

## 2018-08-29 DIAGNOSIS — G43719 Chronic migraine without aura, intractable, without status migrainosus: Secondary | ICD-10-CM

## 2018-08-29 DIAGNOSIS — M5416 Radiculopathy, lumbar region: Secondary | ICD-10-CM

## 2018-08-29 DIAGNOSIS — R531 Weakness: Secondary | ICD-10-CM

## 2018-08-29 DIAGNOSIS — M329 Systemic lupus erythematosus, unspecified: Secondary | ICD-10-CM

## 2018-08-29 DIAGNOSIS — G894 Chronic pain syndrome: Secondary | ICD-10-CM

## 2018-08-30 ENCOUNTER — Ambulatory Visit: Payer: Self-pay | Admitting: Physical Medicine & Rehabilitation

## 2018-09-07 ENCOUNTER — Encounter: Payer: Medicaid Other | Attending: Physical Medicine & Rehabilitation | Admitting: Psychology

## 2018-09-07 ENCOUNTER — Encounter: Payer: Self-pay | Admitting: Psychology

## 2018-09-07 DIAGNOSIS — M329 Systemic lupus erythematosus, unspecified: Secondary | ICD-10-CM | POA: Diagnosis not present

## 2018-09-07 DIAGNOSIS — G894 Chronic pain syndrome: Secondary | ICD-10-CM

## 2018-09-07 DIAGNOSIS — F4310 Post-traumatic stress disorder, unspecified: Secondary | ICD-10-CM | POA: Diagnosis not present

## 2018-09-07 DIAGNOSIS — K861 Other chronic pancreatitis: Secondary | ICD-10-CM | POA: Insufficient documentation

## 2018-09-07 DIAGNOSIS — G43011 Migraine without aura, intractable, with status migrainosus: Secondary | ICD-10-CM | POA: Diagnosis not present

## 2018-09-07 DIAGNOSIS — M545 Low back pain: Secondary | ICD-10-CM | POA: Insufficient documentation

## 2018-09-07 DIAGNOSIS — G35 Multiple sclerosis: Secondary | ICD-10-CM | POA: Insufficient documentation
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
## 2018-09-08 ENCOUNTER — Encounter: Payer: Medicaid Other | Admitting: Physical Medicine & Rehabilitation

## 2018-09-08 ENCOUNTER — Encounter: Payer: Self-pay | Admitting: Physical Medicine & Rehabilitation

## 2018-09-08 VITALS — BP 102/65 | HR 85 | Resp 14 | Ht 63.0 in | Wt 264.0 lb

## 2018-09-08 DIAGNOSIS — G43719 Chronic migraine without aura, intractable, without status migrainosus: Secondary | ICD-10-CM | POA: Diagnosis not present

## 2018-09-08 DIAGNOSIS — M329 Systemic lupus erythematosus, unspecified: Secondary | ICD-10-CM

## 2018-09-08 DIAGNOSIS — M47816 Spondylosis without myelopathy or radiculopathy, lumbar region: Secondary | ICD-10-CM

## 2018-09-08 DIAGNOSIS — Z79899 Other long term (current) drug therapy: Secondary | ICD-10-CM

## 2018-09-08 DIAGNOSIS — M545 Low back pain: Secondary | ICD-10-CM | POA: Diagnosis not present

## 2018-09-08 DIAGNOSIS — K861 Other chronic pancreatitis: Secondary | ICD-10-CM | POA: Diagnosis not present

## 2018-09-08 DIAGNOSIS — F4310 Post-traumatic stress disorder, unspecified: Secondary | ICD-10-CM | POA: Diagnosis not present

## 2018-09-08 DIAGNOSIS — G894 Chronic pain syndrome: Secondary | ICD-10-CM

## 2018-09-08 DIAGNOSIS — Z5181 Encounter for therapeutic drug level monitoring: Secondary | ICD-10-CM

## 2018-09-08 DIAGNOSIS — R531 Weakness: Secondary | ICD-10-CM

## 2018-09-08 DIAGNOSIS — G35 Multiple sclerosis: Secondary | ICD-10-CM | POA: Diagnosis not present

## 2018-09-08 MED ORDER — OXYCODONE HCL 10 MG PO TABS: 10.0000 mg | ORAL_TABLET | Freq: Four times a day (QID) | ORAL | 0 refills | Status: DC | PRN

## 2018-09-08 NOTE — Progress Notes (Signed)
Subjective:    Patient ID: Mary [name], adult    DOB: 11/02/1975, 43 y.o.   MRN: [id number]  HPI  Mary [name] is here in follow-up of her chronic pain syndrome and gait disorder. She has had increased left low back pain which is bothersome in all positions.  She has not had aimovig for over a month and her headaches have increased as a result, substantially.  She states that her mom could not get the medication to her.    I reviewed her most recent lumbar MRI which is from 2016.  Results are as follows.  L2-L3:  Negative except for mild facet hypertrophy.  L3-L4:  Minimal disc bulge.  Mild facet hypertrophy.  No stenosis.  L4-L5: Negative disc. Moderate facet hypertrophy. Mild ligament flavum hypertrophy. No stenosis.  L5-S1: Disc desiccation and mild disc space loss. Mild disc bulge. Mild facet hypertrophy. No spinal or lateral recess stenosis. No significant foraminal stenosis  Mary [name] uses a cane for ambulation.  Denies any recent falls or mishaps.  She does have a dog which she in part got because she wanted to be more active.  Patient had questions about other options for pain medication.  She states that she needs more in the colder months and when her lupus flares up, but is interested in making changes potentially if needed.   Pain Inventory Average Pain 6 Pain Right Now 8 My pain is sharp, burning, stabbing, tingling and aching  In the last 24 hours, has pain interfered with the following? General activity 10 Relation with others 8 Enjoyment of life 8 What TIME of day is your pain at its worst? all Sleep (in general) Fair  Pain is worse with: walking, bending, sitting, standing and some activites Pain improves with: rest and medication Relief from Meds: 5  Mobility walk with assistance use a cane Do you have any goals in this area?  yes  Function disabled: date disabled . Do you have any goals in this area?   yes  Neuro/Psych weakness numbness tingling trouble walking dizziness confusion depression anxiety  Prior Studies Any changes since last visit?  no  Physicians involved in your care Any changes since last visit?  no   Family History  Problem Relation Age of Onset  . Diabetes Mother   . Hypertension Mother   . Cervical cancer Mother   . Heart disease Mother   . Diabetes Father   . Hypertension Father   . Heart disease Father   . Colitis Maternal Aunt   . Diabetes Other        many family members  . CVA Maternal Grandmother   . Lupus Neg Hx   . Sarcoidosis Neg Hx   . Pancreatitis Neg Hx   . Ataxia Neg Hx   . Chorea Neg Hx   . Dementia Neg Hx   . Mental retardation Neg Hx   . Migraines Neg Hx   . Multiple sclerosis Neg Hx   . Neurofibromatosis Neg Hx   . Neuropathy Neg Hx   . Parkinsonism Neg Hx   . Seizures Neg Hx   . Stroke Neg Hx   . Colon cancer Neg Hx    Social History   Socioeconomic History  . Marital status: Single    Spouse name: Not on file  . Number of children: 0  . Years of education: Not on file  . Highest education level: Not on file  Occupational History  . Not on file  Social  Needs  . Financial resource strain: Not on file  . Food insecurity:    Worry: Not on file    Inability: Not on file  . Transportation needs:    Medical: Not on file    Non-medical: Not on file  Tobacco Use  . Smoking status: Former Smoker    Packs/day: 0.50    Years: 20.00    Pack years: 10.00    Types: Cigarettes    Last attempt to quit: 05/22/2013    Years since quitting: 5.3  . Smokeless tobacco: Never Used  Substance and Sexual Activity  . Alcohol use: No    Alcohol/week: 0.0 standard drinks  . Drug use: No  . Sexual activity: Never    Birth control/protection: Abstinence  Lifestyle  . Physical activity:    Days per week: Not on file    Minutes per session: Not on file  . Stress: Not on file  Relationships  . Social connections:    Talks on  phone: Not on file    Gets together: Not on file    Attends religious service: Not on file    Active member of club or organization: Not on file    Attends meetings of clubs or organizations: Not on file    Relationship status: Not on file  Other Topics Concern  . Not on file  Social History Narrative   ** Merged History Encounter **       Lives permanently in [place] with mom and stepfather.  Has not started working yet and was unemployed in [place].         Past Surgical History:  Procedure Laterality Date  . ABDOMINAL SURGERY  ~ 2007   some sort of pancreatic cyst drainage.   . ABDOMINAL SURGERY    . ADENOIDECTOMY    . CHOLECYSTECTOMY     ~ age 7-laparoscopic  . CHOLECYSTECTOMY    . EUS N/A 04/14/2013   Procedure: UPPER ENDOSCOPIC ULTRASOUND (EUS) LINEAR;  Surgeon: Rachael Fee, MD;  Location: [facility];  Service: Endoscopy;  Laterality: N/A;  . HERNIA REPAIR  2017  . ROUX-EN-Y GASTRIC BYPASS    . ROUX-EN-Y PROCEDURE     stent to pancreatic cyst that became infected within 36 hours   Past Medical History:  Diagnosis Date  . Abdominal hernia   . Adrenal insufficiency (HCC) 04/23/2013   ??  Marland Kitchen Anxiety   . Depression    "recent breakup with partner of 15 yrs"  . Depression   . GERD (gastroesophageal reflux disease)   . Hernia 03-24-13   ventral hernia remains   . Lupus (HCC)   . MS (multiple sclerosis) (HCC)   . 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 Pancreatic cyst 2002 onset  . Pancreatitis 03-24-13   2002, 2'2013, 03-14-13  . PTSD (post-traumatic stress disorder)   . Ventral hernia    Ht 5\' 3"  (1.6 m)   Wt 264 lb (119.7 kg)   BMI 46.77 kg/m   Opioid Risk Score:   Fall Risk Score:  `1  Depression screen PHQ 2/9  Depression screen [facility] 2/9 06/29/2018 04/28/2018 12/28/2017 05/13/2017 04/09/2016 10/01/2015 05/21/2015  Decreased Interest 1 1 1 3 2 2 2   Down, Depressed, Hopeless 1 1 1 3 2 2  -  PHQ - 2 Score 2 2 2 6 4 4 2   Altered sleeping  - - 1 - - - -  Tired, decreased energy - - 1 - - - -  Change in appetite - - 1 - - - -  Feeling bad or failure about yourself  - - 1 - - - -  Trouble concentrating - - 1 - - - -  Moving slowly or fidgety/restless - - 1 - - - -  Suicidal thoughts - - 0 - - - -  PHQ-9 Score - - 8 - - - -  Difficult doing work/chores - - Somewhat difficult - - - -     Review of Systems  Constitutional: Positive for unexpected weight change.  HENT: Negative.   Eyes: Negative.   Respiratory: Negative.   Cardiovascular: Negative.   Gastrointestinal: Positive for nausea and vomiting.  Endocrine: Negative.   Genitourinary: Negative.   Musculoskeletal: Positive for arthralgias, back pain and gait problem.  Skin: Negative.   Allergic/Immunologic: Negative.   Neurological: Positive for dizziness, weakness, numbness and headaches.  Psychiatric/Behavioral: Positive for confusion and dysphoric mood. The patient is nervous/anxious.   All other systems reviewed and are negative.      Objective:   Physical Exam General: No acute distress HEENT: EOMI, oral membranes moist Cards: reg rate  Chest: normal effort Abdomen: Soft, NT, ND Skin: dry, intact Extremities: no edema   Musc: Patient has tenderness along the lower lumbar spine.  Left more affected than right.  Lumbar paraspinals in the left seem to be somewhat taut.  She has tenderness along the left glutes with palpation.  These are slightly tight as well.  Continues to walk with wide-based gait and utilizes cane for balance.   Neuro:Motor grossly 4-5 out of 5.Did not perform a focal neuro exam today. Gait is wide-based but stable.  Good insight and awareness.  Has functional memory today. Psych:  Pleasant and cooperative.  Assessment & Plan:  1. Non-specific bicerebral/brainstem white matter disease---initially suspected to be MS, but not characteristic in presentation. LikelySLE.  -continued memory and balance deficits.  2.  Chronic pain syndrome related to above with primarily neuropathic right sided pain. She has had a recent increase in cervical pain and related headaches over the last several months as well.  3. Low back pain with symptoms more in the left low back and buttock.  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 Chronic pancreatitis  5. Conversion disorder/PTSD---at the core of many of her problems it appears 6. Chronic migraines/vascular headaches   Plan:  1.Continuetopamax 100mg  am and 100mg  qpm for headaches and neuropathic pain.  -maintainimitrex to 100mg  for breakthrough migraine -good results with aimovig previously.  She needs to resume.  She will resume the medication this week 2. Continue psychiatry follow up. Many of her neurological symptoms are likely non-organic in nature given her recent spontaneous improvement and relapse. Pt/family aware -continue with pamelor 25mg  -follow up with Dr. Ivin Pootodenboughongoing. Seems to me making progress in this area                 -  3. Oxycodone was refilled. #120 10mg  was refilled today. -We will continue the controlled substance monitoring program, this consists of regular clinic visits, examinations, routine drug screening, pill counts as well as use of [place]. [place].   -Medication was refilled and a second prescription was sent to the patient's pharmacy for next month.   -discussed the possibility of rotating medications but would need to reduce her oxycodone use a bit first.  -ordered xrays of lumbar spine to  reassess lower segments.  -extensive back stretches were provided and reviewed  4.Insomnia:continue nortriptyline -Medication was refilled and a second prescription was sent to the patient's pharmacy for next month.   4.  Sleep hygiene:  making some improvements here 7. Rheumatology follow up as directed.  8. Follow up withNP74months. were spent during this visit. All questions were encouraged and answered. Marland Kitchen

## 2018-09-08 NOTE — Patient Instructions (Signed)
PLEASE FEEL FREE TO CALL OUR OFFICE WITH ANY PROBLEMS OR QUESTIONS ([phone])      

## 2018-10-05 ENCOUNTER — Ambulatory Visit: Payer: Self-pay | Admitting: Psychology

## 2018-10-12 ENCOUNTER — Encounter: Payer: Medicaid Other | Admitting: Psychology

## 2018-10-12 ENCOUNTER — Other Ambulatory Visit: Payer: Self-pay | Admitting: Internal Medicine

## 2018-10-12 ENCOUNTER — Other Ambulatory Visit: Payer: Self-pay | Admitting: Physical Medicine & Rehabilitation

## 2018-10-12 DIAGNOSIS — G894 Chronic pain syndrome: Secondary | ICD-10-CM

## 2018-10-30 ENCOUNTER — Other Ambulatory Visit: Payer: Self-pay | Admitting: Physical Medicine & Rehabilitation

## 2018-10-30 DIAGNOSIS — G43719 Chronic migraine without aura, intractable, without status migrainosus: Secondary | ICD-10-CM

## 2018-10-30 DIAGNOSIS — G894 Chronic pain syndrome: Secondary | ICD-10-CM

## 2018-10-30 DIAGNOSIS — R531 Weakness: Secondary | ICD-10-CM

## 2018-10-30 DIAGNOSIS — M329 Systemic lupus erythematosus, unspecified: Secondary | ICD-10-CM

## 2018-10-30 DIAGNOSIS — M5416 Radiculopathy, lumbar region: Secondary | ICD-10-CM

## 2018-11-02 ENCOUNTER — Encounter: Payer: Medicaid Other | Admitting: Psychology

## 2018-11-11 ENCOUNTER — Other Ambulatory Visit: Payer: Self-pay

## 2018-11-11 ENCOUNTER — Encounter: Payer: Medicaid Other | Attending: Physical Medicine & Rehabilitation | Admitting: Registered Nurse

## 2018-11-11 ENCOUNTER — Other Ambulatory Visit: Payer: Self-pay | Admitting: Physical Medicine & Rehabilitation

## 2018-11-11 ENCOUNTER — Encounter: Payer: Medicaid Other | Admitting: Registered Nurse

## 2018-11-11 ENCOUNTER — Encounter: Payer: Self-pay | Admitting: Registered Nurse

## 2018-11-11 VITALS — Ht 63.0 in | Wt 264.0 lb

## 2018-11-11 DIAGNOSIS — M62838 Other muscle spasm: Secondary | ICD-10-CM

## 2018-11-11 DIAGNOSIS — G35 Multiple sclerosis: Secondary | ICD-10-CM

## 2018-11-11 DIAGNOSIS — R531 Weakness: Secondary | ICD-10-CM

## 2018-11-11 DIAGNOSIS — G8929 Other chronic pain: Secondary | ICD-10-CM

## 2018-11-11 DIAGNOSIS — M47816 Spondylosis without myelopathy or radiculopathy, lumbar region: Secondary | ICD-10-CM | POA: Diagnosis not present

## 2018-11-11 DIAGNOSIS — M329 Systemic lupus erythematosus, unspecified: Secondary | ICD-10-CM

## 2018-11-11 DIAGNOSIS — M546 Pain in thoracic spine: Secondary | ICD-10-CM | POA: Diagnosis not present

## 2018-11-11 DIAGNOSIS — Z5181 Encounter for therapeutic drug level monitoring: Secondary | ICD-10-CM

## 2018-11-11 DIAGNOSIS — K861 Other chronic pancreatitis: Secondary | ICD-10-CM | POA: Insufficient documentation

## 2018-11-11 DIAGNOSIS — Z79899 Other long term (current) drug therapy: Secondary | ICD-10-CM

## 2018-11-11 DIAGNOSIS — F4310 Post-traumatic stress disorder, unspecified: Secondary | ICD-10-CM | POA: Diagnosis not present

## 2018-11-11 DIAGNOSIS — G43719 Chronic migraine without aura, intractable, without status migrainosus: Secondary | ICD-10-CM

## 2018-11-11 DIAGNOSIS — M545 Low back pain: Secondary | ICD-10-CM | POA: Insufficient documentation

## 2018-11-11 DIAGNOSIS — G894 Chronic pain syndrome: Secondary | ICD-10-CM | POA: Insufficient documentation

## 2018-11-11 DIAGNOSIS — F5101 Primary insomnia: Secondary | ICD-10-CM

## 2018-11-11 MED ORDER — NORTRIPTYLINE HCL 25 MG PO CAPS: 25.0000 mg | ORAL_CAPSULE | Freq: Every day | ORAL | 3 refills | Status: DC

## 2018-11-11 MED ORDER — OXYCODONE HCL 10 MG PO TABS: 10.0000 mg | ORAL_TABLET | Freq: Four times a day (QID) | ORAL | 0 refills | Status: DC | PRN

## 2018-11-11 NOTE — Progress Notes (Addendum)
Subjective:    Patient ID: Mary [name], adult    DOB: May 15, 1976, 43 y.o.   MRN: [id number]  HPI: This provider placed a call to [name] is a 43 y.o. female,  her appointment was changed to a virtual office visit to reduce the risk of exposure to the COVID-19 virus and to help her remain healthy and safe. The virtual visit will also provide continuity of care. She verbalizes understanding.  She states her pain is located in her upper- lower back mainly right side she reports and right hip pain. She rates her pain 7. Her. current exercise regime is walking.   [name] equivalent is 60.00 MME.  She is also prescribed Clonazepam by Joice Lofts- McDonald. We have discussed the black box warning of using opioids and benzodiazepines. I highlighted the dangers of using these drugs together and discussed the adverse events including respiratory suppression, overdose, cognitive impairment and importance of compliance with current regimen. We will continue to monitor and adjust as indicated.  She is being closely monitored and under the care of her psychiatrist.   Last UDS was Performed on 02/23/2018, it was consistent.   Angela Nevin CMA asked the Health and History Questions. This provider and Purvis Sheffield verified we were speaking with the correct person using two identifiers.  Pain Inventory Average Pain 6 Pain Right Now 7 My pain is constant, sharp, burning, dull, stabbing, tingling and aching  In the last 24 hours, has pain interfered with the following? General activity 9 Relation with others 5 Enjoyment of life 6 What TIME of day is your pain at its worst? morning , evening Sleep (in general) Fair  Pain is worse with: bending Pain improves with: rest and medication Relief from Meds: 8  Mobility walk without assistance walk with assistance use a cane use a walker ability to climb steps?  yes do you drive?  no  Function disabled: date disabled  .  Neuro/Psych weakness numbness tremor tingling trouble walking spasms depression anxiety  Prior Studies Any changes since last visit?  no  Physicians involved in your care Any changes since last visit?  no   Family History  Problem Relation Age of Onset   Diabetes Mother    Hypertension Mother    Cervical cancer Mother    Heart disease Mother    Diabetes Father    Hypertension Father    Heart disease Father    Colitis Maternal Aunt    Diabetes Other        many family members   CVA Maternal Grandmother    Lupus Neg Hx    Sarcoidosis Neg Hx    Pancreatitis Neg Hx    Ataxia Neg Hx    Chorea Neg Hx    Dementia Neg Hx    Mental retardation Neg Hx    Migraines Neg Hx    Multiple sclerosis Neg Hx    Neurofibromatosis Neg Hx    Neuropathy Neg Hx    Parkinsonism Neg Hx    Seizures Neg Hx    Stroke Neg Hx    Colon cancer Neg Hx    Social History   Socioeconomic History   Marital status: Single    Spouse name: Not on file   Number of children: 0   Years of education: Not on file   Highest education level: Not on file  Occupational History   Not on file  Social Needs   Financial resource strain: Not on  file   Food insecurity:    Worry: Not on file    Inability: Not on file   Transportation needs:    Medical: Not on file    Non-medical: Not on file  Tobacco Use   Smoking status: Former Smoker    Packs/day: 0.50    Years: 20.00    Pack years: 10.00    Types: Cigarettes    Last attempt to quit: 05/22/2013    Years since quitting: 5.4   Smokeless tobacco: Never Used  Substance and Sexual Activity   Alcohol use: No    Alcohol/week: 0.0 standard drinks   Drug use: No   Sexual activity: Never    Birth control/protection: Abstinence  Lifestyle   Physical activity:    Days per week: Not on file    Minutes per session: Not on file   Stress: Not on file  Relationships   Social connections:    Talks on phone:  Not on file    Gets together: Not on file    Attends religious service: Not on file    Active member of club or organization: Not on file    Attends meetings of clubs or organizations: Not on file    Relationship status: Not on file  Other Topics Concern   Not on file  Social History Narrative   ** Merged History Encounter **       Lives permanently in [place] with mom and stepfather.  Has not started working yet and was unemployed in [place].         Past Surgical History:  Procedure Laterality Date   ABDOMINAL SURGERY  ~ 2007   some sort of pancreatic cyst drainage.    ABDOMINAL SURGERY     ADENOIDECTOMY     CHOLECYSTECTOMY     ~ age 70-laparoscopic   CHOLECYSTECTOMY     EUS N/A 04/14/2013   Procedure: UPPER ENDOSCOPIC ULTRASOUND (EUS) LINEAR;  Surgeon: Rachael Fee, MD;  Location: [facility];  Service: Endoscopy;  Laterality: N/A;   HERNIA REPAIR  2017   ROUX-EN-Y GASTRIC BYPASS     ROUX-EN-Y PROCEDURE     stent to pancreatic cyst that became infected within 36 hours   Past Medical History:  Diagnosis Date   Abdominal hernia    Adrenal insufficiency (HCC) 04/23/2013   ??   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    Pancreatic cyst 2002 onset   Pancreatitis 03-24-13   2002, 2'2013, 03-14-13   PTSD (post-traumatic stress disorder)    Ventral hernia    Ht 5\' 3"  (1.6 m)    Wt 264 lb (119.7 kg)    BMI 46.77 kg/m   Opioid Risk Score:   Fall Risk Score:  `1  Depression screen PHQ 2/9  Depression screen [facility] 2/9 06/29/2018 04/28/2018 12/28/2017 05/13/2017 04/09/2016 10/01/2015 05/21/2015  Decreased Interest 1 1 1 3 2 2 2   Down, Depressed, Hopeless 1 1 1 3 2 2  -  PHQ - 2 Score 2 2 2 6 4 4 2   Altered sleeping - - 1 -  - - -  Tired, decreased energy - - 1 - - - -  Change in appetite - - 1 - - - -  Feeling bad or failure about yourself  - - 1 - - - -  Trouble concentrating - - 1 - - - -  Moving slowly or fidgety/restless - - 1 - - - -  Suicidal thoughts - - 0 - - - -  PHQ-9 Score - - 8 - - - -  Difficult doing work/chores - - Somewhat difficult - - - -    Review of Systems  Constitutional: Negative.   Eyes: Negative.   Respiratory: Positive for cough.   Gastrointestinal: Positive for abdominal pain, diarrhea, nausea and vomiting.  Endocrine: Negative.   Genitourinary: Negative.   Musculoskeletal: Positive for back pain.  Skin: Negative.   Allergic/Immunologic: Negative.   Neurological: Positive for dizziness, weakness and numbness.  Psychiatric/Behavioral: Positive for dysphoric mood. The patient is nervous/anxious.        Objective:   Physical Exam Nursing note reviewed.  Neurological:     Mental Status: She is oriented to person, place, and time.           Assessment & Plan:  1. MS: Neurology Following. 11/11/2018 2. 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 #120. 11/11/2018 3. Low back pain with Radiculopathy RLE/: Continued topamax.11/11/2018  4. Chronic pancreatitis: PCP and Gastroenterologist Following 5. Insomnia: Continue Pamelor. 11/11/2018 6. PTSD: Psychiatry Following.  Dr. Kieth Brightly following. Marland Kitchen 11/11/2018. 7. Lupus: Rheumatology Following. 11/11/2018 8. Chronic Migraine/ Vascular Headaches: Continue Imitrex,Topamax and Aimovig.11/11/2018 9. Right Knee Pain:No complaints today.  Continue HEP as Tolerated. Continue to Monitor. 11/11/2018. 10. Muscle Spasm: Continue Baclofen. Continue to Monitor.   Telephone Call  Location of Patient: Home  Location of Provider: Office Total Time Spent: 11 Minutes  Follow up in 2 months.

## 2018-11-15 ENCOUNTER — Other Ambulatory Visit: Payer: Self-pay | Admitting: Physical Medicine & Rehabilitation

## 2018-11-15 DIAGNOSIS — G43719 Chronic migraine without aura, intractable, without status migrainosus: Secondary | ICD-10-CM

## 2018-11-30 ENCOUNTER — Ambulatory Visit: Payer: Self-pay | Admitting: Psychology

## 2018-12-07 ENCOUNTER — Ambulatory Visit: Payer: Self-pay | Admitting: Psychology

## 2019-01-04 ENCOUNTER — Encounter: Payer: Medicaid Other | Admitting: Psychology

## 2019-01-11 ENCOUNTER — Other Ambulatory Visit: Payer: Self-pay | Admitting: Physical Medicine & Rehabilitation

## 2019-01-11 DIAGNOSIS — M329 Systemic lupus erythematosus, unspecified: Secondary | ICD-10-CM

## 2019-01-11 DIAGNOSIS — G35 Multiple sclerosis: Secondary | ICD-10-CM

## 2019-01-11 DIAGNOSIS — R531 Weakness: Secondary | ICD-10-CM

## 2019-01-13 ENCOUNTER — Encounter: Payer: Medicaid Other | Admitting: Registered Nurse

## 2019-01-25 ENCOUNTER — Encounter: Payer: Self-pay | Admitting: Registered Nurse

## 2019-01-25 ENCOUNTER — Other Ambulatory Visit: Payer: Self-pay

## 2019-01-25 ENCOUNTER — Encounter: Payer: Medicaid Other | Attending: Physical Medicine & Rehabilitation | Admitting: Registered Nurse

## 2019-01-25 VITALS — Ht 63.0 in | Wt 252.0 lb

## 2019-01-25 DIAGNOSIS — G35 Multiple sclerosis: Secondary | ICD-10-CM | POA: Insufficient documentation

## 2019-01-25 DIAGNOSIS — G43719 Chronic migraine without aura, intractable, without status migrainosus: Secondary | ICD-10-CM | POA: Diagnosis not present

## 2019-01-25 DIAGNOSIS — F4310 Post-traumatic stress disorder, unspecified: Secondary | ICD-10-CM | POA: Diagnosis not present

## 2019-01-25 DIAGNOSIS — M546 Pain in thoracic spine: Secondary | ICD-10-CM

## 2019-01-25 DIAGNOSIS — Z5181 Encounter for therapeutic drug level monitoring: Secondary | ICD-10-CM

## 2019-01-25 DIAGNOSIS — M329 Systemic lupus erythematosus, unspecified: Secondary | ICD-10-CM

## 2019-01-25 DIAGNOSIS — R531 Weakness: Secondary | ICD-10-CM

## 2019-01-25 DIAGNOSIS — G8929 Other chronic pain: Secondary | ICD-10-CM

## 2019-01-25 DIAGNOSIS — G894 Chronic pain syndrome: Secondary | ICD-10-CM | POA: Insufficient documentation

## 2019-01-25 DIAGNOSIS — M47816 Spondylosis without myelopathy or radiculopathy, lumbar region: Secondary | ICD-10-CM

## 2019-01-25 DIAGNOSIS — Z79899 Other long term (current) drug therapy: Secondary | ICD-10-CM

## 2019-01-25 DIAGNOSIS — K861 Other chronic pancreatitis: Secondary | ICD-10-CM | POA: Insufficient documentation

## 2019-01-25 DIAGNOSIS — M545 Low back pain: Secondary | ICD-10-CM | POA: Insufficient documentation

## 2019-01-25 MED ORDER — OXYCODONE HCL 10 MG PO TABS: 10.0000 mg | ORAL_TABLET | Freq: Four times a day (QID) | ORAL | 0 refills | Status: DC | PRN

## 2019-01-25 NOTE — Progress Notes (Signed)
Subjective:    Patient ID: Mary MeHaley M Meunier, adult    DOB: Sep 13, 1975, 43 y.o.   MRN: [id number]  HPI: Mary [name] is a 43 y.o. female her appointment was changed, due to national recommendations of social distancing due to COVID 19, an audio/video telehealth visit is felt to be most appropriate for this patient at this time.  See Chart message from today for the patient's consent to telehealth from [facility].   She states her pain is located in her neck mainly right side, upper- lower back mainly right side radiating into her right lower extremity. She rates her pain 8. Her current exercise regime is walking.   On 12/30/2018 Mary [name] had [facility] CT [facility], she was diagnosed with Acute Pulmonary Embolism. She was prescribed Xarelto.  Mary [name] [facility] and discharged on 01/13/2019, she was admitted for abdominal pain. Notes were reviewed.   Mary [name] Morphine equivalent is 60.00 MME.  She  is also prescribed Clonazepam by Barbette Merinoarolyn McDonald..We have discussed the black box warning of using opioids and benzodiazepines. I highlighted the dangers of using these drugs together and discussed the adverse events including respiratory suppression, overdose, cognitive impairment and importance of compliance with current regimen. We will continue to monitor and adjust as indicated.  She is being closely monitored and under the care of her psychiatrist.    Last UDS was Performed on 02/23/2018, it was consistent.   Angela NevinKenneth Wessling CMA asked the health and history questions. This provider and Purvis SheffieldKen Wessling verified we were speaking with the correct person using two identifiers.  Pain Inventory Average Pain 6 Pain Right Now 8 My pain is constant, sharp, burning, dull, stabbing, tingling and aching  In the last 24 hours, has pain interfered with the following? General activity 8 Relation with others  6 Enjoyment of life 8 What TIME of day is your pain at its worst? morning, evening Sleep (in general) Fair  Pain is worse with: walking, bending, sitting, inactivity, standing and some activites Pain improves with: rest and medication Relief from Meds: 5  Mobility walk without assistance walk with assistance ability to climb steps?  yes do you drive?  no  Function disabled: date disabled .  Neuro/Psych weakness numbness tremor tingling trouble walking spasms dizziness confusion depression anxiety  Prior Studies Any changes since last visit?  no  Physicians involved in your care Any changes since last visit?  no   Family History  Problem Relation Age of Onset  . Diabetes Mother   . Hypertension Mother   . Cervical cancer Mother   . Heart disease Mother   . Diabetes Father   . Hypertension Father   . Heart disease Father   . Colitis Maternal Aunt   . Diabetes Other        many family members  . CVA Maternal Grandmother   . Lupus Neg Hx   . Sarcoidosis Neg Hx   . Pancreatitis Neg Hx   . Ataxia Neg Hx   . Chorea Neg Hx   . Dementia Neg Hx   . Mental retardation Neg Hx   . Migraines Neg Hx   . Multiple sclerosis Neg Hx   . Neurofibromatosis Neg Hx   . Neuropathy Neg Hx   . Parkinsonism Neg Hx   . Seizures Neg Hx   . Stroke Neg Hx   . Colon cancer Neg Hx  Social History   Socioeconomic History  . Marital status: Single    Spouse name: Not on file  . Number of children: 0  . Years of education: Not on file  . Highest education level: Not on file  Occupational History  . Not on file  Social Needs  . Financial resource strain: Not on file  . Food insecurity:    Worry: Not on file    Inability: Not on file  . Transportation needs:    Medical: Not on file    Non-medical: Not on file  Tobacco Use  . Smoking status: Former Smoker    Packs/day: 0.50    Years: 20.00    Pack years: 10.00    Types: Cigarettes    Last attempt to quit:  05/22/2013    Years since quitting: 5.6  . Smokeless tobacco: Never Used  Substance and Sexual Activity  . Alcohol use: No    Alcohol/week: 0.0 standard drinks  . Drug use: No  . Sexual activity: Never    Birth control/protection: Abstinence  Lifestyle  . Physical activity:    Days per week: Not on file    Minutes per session: Not on file  . Stress: Not on file  Relationships  . Social connections:    Talks on phone: Not on file    Gets together: Not on file    Attends religious service: Not on file    Active member of club or organization: Not on file    Attends meetings of clubs or organizations: Not on file    Relationship status: Not on file  Other Topics Concern  . Not on file  Social History Narrative   ** Merged History Encounter **       Lives permanently in [place] with mom and stepfather.  Has not started working yet and was unemployed in [place].         Past Surgical History:  Procedure Laterality Date  . ABDOMINAL SURGERY  ~ 2007   some sort of pancreatic cyst drainage.   . ABDOMINAL SURGERY    . ADENOIDECTOMY    . CHOLECYSTECTOMY     ~ age 67-laparoscopic  . CHOLECYSTECTOMY    . EUS N/A 04/14/2013   Procedure: UPPER ENDOSCOPIC ULTRASOUND (EUS) LINEAR;  Surgeon: Milus Banister, MD;  Location: [facility];  Service: Endoscopy;  Laterality: N/A;  . HERNIA REPAIR  2017  . ROUX-EN-Y GASTRIC BYPASS    . ROUX-EN-Y PROCEDURE     stent to pancreatic cyst that became infected within 36 hours   Past Medical History:  Diagnosis Date  . Abdominal hernia   . Adrenal insufficiency ([place]) 04/23/2013   ??  Marland Kitchen Anxiety   . Depression    "recent breakup with partner of 15 yrs"  . Depression   . GERD (gastroesophageal reflux disease)   . Hernia 03-24-13   ventral hernia remains   . Lupus ([place])   . MS (multiple sclerosis) ([place])   . 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 Pancreatic cyst 2002 onset  . Pancreatitis 03-24-13   2002, 2'2013, 03-14-13   . PTSD (post-traumatic stress disorder)   . Ventral hernia    Ht 5\' 3"  (1.6 m)   Wt 252 lb (114.3 kg)   BMI 44.64 kg/m   Opioid Risk Score:   Fall Risk Score:  `1  Depression screen [facility] 2/9  Depression screen [facility] 2/9 06/29/2018 04/28/2018 12/28/2017 05/13/2017 04/09/2016 10/01/2015 05/21/2015  Decreased Interest 1  1 1 3 2 2 2   Down, Depressed, Hopeless 1 1 1 3 2 2  -  PHQ - 2 Score 2 2 2 6 4 4 2   Altered sleeping - - 1 - - - -  Tired, decreased energy - - 1 - - - -  Change in appetite - - 1 - - - -  Feeling bad or failure about yourself  - - 1 - - - -  Trouble concentrating - - 1 - - - -  Moving slowly or fidgety/restless - - 1 - - - -  Suicidal thoughts - - 0 - - - -  PHQ-9 Score - - 8 - - - -  Difficult doing work/chores - - Somewhat difficult - - - -    Review of Systems  Constitutional: Negative.   HENT: Negative.   Eyes: Negative.   Respiratory: Negative.   Cardiovascular: Negative.   Gastrointestinal: Negative.   Endocrine: Negative.   Genitourinary: Negative.   Musculoskeletal: Positive for arthralgias, back pain, gait problem, neck pain and neck stiffness.  Skin: Negative.   Allergic/Immunologic: Negative.   Neurological: Positive for tremors, weakness and numbness.       Tingling  Psychiatric/Behavioral: Positive for dysphoric mood. The patient is nervous/anxious.        Objective:   Physical Exam Vitals signs and nursing note reviewed.  Musculoskeletal:     Comments: No Physical Exam Performed: Virtual Visit  Neurological:     Mental Status: She is oriented to person, place, and time.           Assessment & Plan:  1. MS: Neurology Following. 01/26/2019 2. 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 #120. 01/26/2019 3. Low back pain with Radiculopathy RLE/: Continued topamax.01/26/2019 4. Chronic pancreatitis: PCP and  Gastroenterologist Following 5. Insomnia: Continue Pamelor. 01/26/2019 6. PTSD: Psychiatry Following.  Dr. Kieth Brightly following. Marland Kitchen 01/26/2019. 7. Lupus: Rheumatology Following. 01/26/2019 8. Chronic Migraine/ Vascular Headaches: Continue Imitrex,Topamax and Aimovig.01/26/2019 9. Right Knee Pain:No complaints today.  Continue HEP as Tolerated. Continue to Monitor.01/26/2019. 10. Muscle Spasm: Continue Baclofen. Continue to Monitor.   Telephone Call  Location of patient: In her Home Location of provider: Office Established patient Time spent on call:10 Minutes   F/U in 2 months

## 2019-02-01 ENCOUNTER — Ambulatory Visit: Payer: Self-pay | Admitting: Psychology

## 2019-02-05 ENCOUNTER — Other Ambulatory Visit: Payer: Self-pay | Admitting: Physical Medicine & Rehabilitation

## 2019-02-05 DIAGNOSIS — R531 Weakness: Secondary | ICD-10-CM

## 2019-02-05 DIAGNOSIS — M5416 Radiculopathy, lumbar region: Secondary | ICD-10-CM

## 2019-02-05 DIAGNOSIS — G894 Chronic pain syndrome: Secondary | ICD-10-CM

## 2019-02-05 DIAGNOSIS — M329 Systemic lupus erythematosus, unspecified: Secondary | ICD-10-CM

## 2019-02-05 DIAGNOSIS — G43719 Chronic migraine without aura, intractable, without status migrainosus: Secondary | ICD-10-CM

## 2019-02-10 ENCOUNTER — Other Ambulatory Visit: Payer: Self-pay | Admitting: Physical Medicine & Rehabilitation

## 2019-02-10 DIAGNOSIS — M329 Systemic lupus erythematosus, unspecified: Secondary | ICD-10-CM

## 2019-02-10 DIAGNOSIS — R531 Weakness: Secondary | ICD-10-CM

## 2019-02-10 DIAGNOSIS — G35 Multiple sclerosis: Secondary | ICD-10-CM

## 2019-02-23 ENCOUNTER — Telehealth: Payer: Self-pay

## 2019-02-23 NOTE — Telephone Encounter (Signed)
Patient called requesting refill on Oxycodone. According to patient med list she should have a prescription on hold at the pharmacy. Patient is aware and will check with pharmacy.

## 2019-03-08 ENCOUNTER — Encounter: Payer: Medicaid Other | Admitting: Psychology

## 2019-03-10 ENCOUNTER — Other Ambulatory Visit: Payer: Self-pay | Admitting: Physical Medicine & Rehabilitation

## 2019-03-10 DIAGNOSIS — G43011 Migraine without aura, intractable, with status migrainosus: Secondary | ICD-10-CM

## 2019-03-22 ENCOUNTER — Encounter: Payer: Medicaid Other | Attending: Physical Medicine & Rehabilitation | Admitting: Registered Nurse

## 2019-03-22 DIAGNOSIS — G35 Multiple sclerosis: Secondary | ICD-10-CM | POA: Insufficient documentation

## 2019-03-22 DIAGNOSIS — M545 Low back pain: Secondary | ICD-10-CM | POA: Insufficient documentation

## 2019-03-22 DIAGNOSIS — G894 Chronic pain syndrome: Secondary | ICD-10-CM | POA: Insufficient documentation

## 2019-03-22 DIAGNOSIS — K861 Other chronic pancreatitis: Secondary | ICD-10-CM | POA: Insufficient documentation

## 2019-03-23 ENCOUNTER — Other Ambulatory Visit: Payer: Self-pay | Admitting: Physical Medicine & Rehabilitation

## 2019-03-23 DIAGNOSIS — R531 Weakness: Secondary | ICD-10-CM

## 2019-03-23 DIAGNOSIS — G35 Multiple sclerosis: Secondary | ICD-10-CM

## 2019-03-23 DIAGNOSIS — M329 Systemic lupus erythematosus, unspecified: Secondary | ICD-10-CM

## 2019-03-23 DIAGNOSIS — G43719 Chronic migraine without aura, intractable, without status migrainosus: Secondary | ICD-10-CM

## 2019-03-23 DIAGNOSIS — Z5181 Encounter for therapeutic drug level monitoring: Secondary | ICD-10-CM

## 2019-03-23 DIAGNOSIS — Z79899 Other long term (current) drug therapy: Secondary | ICD-10-CM

## 2019-03-23 DIAGNOSIS — G894 Chronic pain syndrome: Secondary | ICD-10-CM

## 2019-03-25 ENCOUNTER — Other Ambulatory Visit: Payer: Self-pay | Admitting: Physical Medicine & Rehabilitation

## 2019-03-25 DIAGNOSIS — G43719 Chronic migraine without aura, intractable, without status migrainosus: Secondary | ICD-10-CM

## 2019-03-31 ENCOUNTER — Encounter: Payer: Medicaid Other | Admitting: Registered Nurse

## 2019-03-31 ENCOUNTER — Other Ambulatory Visit: Payer: Self-pay

## 2019-03-31 DIAGNOSIS — F4310 Post-traumatic stress disorder, unspecified: Secondary | ICD-10-CM

## 2019-03-31 DIAGNOSIS — G43719 Chronic migraine without aura, intractable, without status migrainosus: Secondary | ICD-10-CM

## 2019-03-31 DIAGNOSIS — K861 Other chronic pancreatitis: Secondary | ICD-10-CM | POA: Diagnosis not present

## 2019-03-31 DIAGNOSIS — M47816 Spondylosis without myelopathy or radiculopathy, lumbar region: Secondary | ICD-10-CM

## 2019-03-31 DIAGNOSIS — Z79899 Other long term (current) drug therapy: Secondary | ICD-10-CM

## 2019-03-31 DIAGNOSIS — G894 Chronic pain syndrome: Secondary | ICD-10-CM

## 2019-03-31 DIAGNOSIS — M2550 Pain in unspecified joint: Secondary | ICD-10-CM

## 2019-03-31 DIAGNOSIS — G35 Multiple sclerosis: Secondary | ICD-10-CM

## 2019-03-31 DIAGNOSIS — G35D Multiple sclerosis, unspecified: Secondary | ICD-10-CM

## 2019-03-31 DIAGNOSIS — M546 Pain in thoracic spine: Secondary | ICD-10-CM

## 2019-03-31 DIAGNOSIS — Z5181 Encounter for therapeutic drug level monitoring: Secondary | ICD-10-CM

## 2019-03-31 DIAGNOSIS — M329 Systemic lupus erythematosus, unspecified: Secondary | ICD-10-CM

## 2019-03-31 DIAGNOSIS — M5416 Radiculopathy, lumbar region: Secondary | ICD-10-CM

## 2019-03-31 DIAGNOSIS — G43011 Migraine without aura, intractable, with status migrainosus: Secondary | ICD-10-CM

## 2019-03-31 DIAGNOSIS — R531 Weakness: Secondary | ICD-10-CM

## 2019-03-31 DIAGNOSIS — M62838 Other muscle spasm: Secondary | ICD-10-CM

## 2019-03-31 DIAGNOSIS — G8929 Other chronic pain: Secondary | ICD-10-CM

## 2019-03-31 DIAGNOSIS — F5101 Primary insomnia: Secondary | ICD-10-CM

## 2019-03-31 DIAGNOSIS — M545 Low back pain: Secondary | ICD-10-CM | POA: Diagnosis not present

## 2019-03-31 MED ORDER — OXYCODONE HCL 10 MG PO TABS: 10.0000 mg | ORAL_TABLET | Freq: Four times a day (QID) | ORAL | 0 refills | Status: DC | PRN

## 2019-03-31 MED ORDER — METHOCARBAMOL 500 MG PO TABS: 500.0000 mg | ORAL_TABLET | Freq: Every day | ORAL | 1 refills | Status: DC | PRN

## 2019-03-31 MED ORDER — BACLOFEN 10 MG PO TABS: ORAL_TABLET | ORAL | 3 refills | Status: DC

## 2019-03-31 MED ORDER — AIMOVIG 70 MG/ML ~~LOC~~ SOAJ: 1.0000 mL | SUBCUTANEOUS | 3 refills | Status: DC

## 2019-03-31 NOTE — Progress Notes (Signed)
Subjective:    Patient ID: Mary [name], adult    DOB: 16-Aug-1976, 43 y.o.   MRN: [id number]  HPI: Mary [name] is a 43 y.o. female who returns for follow up appointment for chronic pain and medication refill. She states her pain is located in her bilateral hands, bilateral shoulders, lower back, right knee and bilateral ankles. She rates her pain 7. Her current exercise regime is walking.   Mary [name] Morphine equivalent is 60.00 MME. She is also prescribed Clonazepam by Barbette Merinoarolyn McDonald. We have discussed the black box warning of using opioids and benzodiazepines. I highlighted the dangers of using these drugs together and discussed the adverse events including respiratory suppression, overdose, cognitive impairment and importance of compliance with current regimen. We will continue to monitor and adjust as indicated.   She is being closely monitored and under the care of her psychiatrist and psychology Dr. Kieth Brightlyodenbough.   Last UDS was Performed on 07/09/ 2019, it was consistent.  BP 100/60   Pain Inventory Average Pain 6 Pain Right Now 7 My pain is sharp, burning, dull, stabbing, tingling and aching  In the last 24 hours, has pain interfered with the following? General activity 10 Relation with others 6 Enjoyment of life 8 What TIME of day is your pain at its worst? night Sleep (in general) Fair  Pain is worse with: bending, standing and some activites Pain improves with: rest and medication Relief from Meds: 5  Mobility walk with assistance use a cane ability to climb steps?  yes do you drive?  no  Function disabled: date disabled .  Neuro/Psych weakness numbness tingling dizziness confusion depression anxiety  Prior Studies Any changes since last visit?  no  Physicians involved in your care Any changes since last visit?  no   Family History  Problem Relation Age of Onset  . Diabetes Mother   . Hypertension Mother   . Cervical cancer Mother   . Heart  disease Mother   . Diabetes Father   . Hypertension Father   . Heart disease Father   . Colitis Maternal Aunt   . Diabetes Other        many family members  . CVA Maternal Grandmother   . Lupus Neg Hx   . Sarcoidosis Neg Hx   . Pancreatitis Neg Hx   . Ataxia Neg Hx   . Chorea Neg Hx   . Dementia Neg Hx   . Mental retardation Neg Hx   . Migraines Neg Hx   . Multiple sclerosis Neg Hx   . Neurofibromatosis Neg Hx   . Neuropathy Neg Hx   . Parkinsonism Neg Hx   . Seizures Neg Hx   . Stroke Neg Hx   . Colon cancer Neg Hx    Social History   Socioeconomic History  . Marital status: Single    Spouse name: Not on file  . Number of children: 0  . Years of education: Not on file  . Highest education level: Not on file  Occupational History  . Not on file  Social Needs  . Financial resource strain: Not on file  . Food insecurity    Worry: Not on file    Inability: Not on file  . Transportation needs    Medical: Not on file    Non-medical: Not on file  Tobacco Use  . Smoking status: Former Smoker    Packs/day: 0.50    Years: 20.00    Pack years: 10.00  Types: Cigarettes    Quit date: 05/22/2013    Years since quitting: 5.8  . Smokeless tobacco: Never Used  Substance and Sexual Activity  . Alcohol use: No    Alcohol/week: 0.0 standard drinks  . Drug use: No  . Sexual activity: Never    Birth control/protection: Abstinence  Lifestyle  . Physical activity    Days per week: Not on file    Minutes per session: Not on file  . Stress: Not on file  Relationships  . Social Herbalist on phone: Not on file    Gets together: Not on file    Attends religious service: Not on file    Active member of club or organization: Not on file    Attends meetings of clubs or organizations: Not on file    Relationship status: Not on file  Other Topics Concern  . Not on file  Social History Narrative   ** Merged History Encounter **       Lives permanently in [place] with  mom and stepfather.  Has not started working yet and was unemployed in [place].         Past Surgical History:  Procedure Laterality Date  . ABDOMINAL SURGERY  ~ 2007   some sort of pancreatic cyst drainage.   . ABDOMINAL SURGERY    . ADENOIDECTOMY    . CHOLECYSTECTOMY     ~ age 25-laparoscopic  . CHOLECYSTECTOMY    . EUS N/A 04/14/2013   Procedure: UPPER ENDOSCOPIC ULTRASOUND (EUS) LINEAR;  Surgeon: Milus Banister, MD;  Location: [facility];  Service: Endoscopy;  Laterality: N/A;  . HERNIA REPAIR  2017  . ROUX-EN-Y GASTRIC BYPASS    . ROUX-EN-Y PROCEDURE     stent to pancreatic cyst that became infected within 36 hours   Past Medical History:  Diagnosis Date  . Abdominal hernia   . Adrenal insufficiency ([place]) 04/23/2013   ??  Marland Kitchen Anxiety   . Depression    "recent breakup with partner of 15 yrs"  . Depression   . GERD (gastroesophageal reflux disease)   . Hernia 03-24-13   ventral hernia remains   . Lupus ([place])   . MS (multiple sclerosis) ([place])   . 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 Pancreatic cyst 2002 onset  . Pancreatitis 03-24-13   2002, 2'2013, 03-14-13  . PTSD (post-traumatic stress disorder)   . Ventral hernia    There were no vitals taken for this visit.  Opioid Risk Score:   Fall Risk Score:  `1  Depression screen PHQ 2/9  Depression screen [facility] 2/9 06/29/2018 04/28/2018 12/28/2017 05/13/2017 04/09/2016 10/01/2015 05/21/2015  Decreased Interest 1 1 1 3 2 2 2   Down, Depressed, Hopeless 1 1 1 3 2 2  -  PHQ - 2 Score 2 2 2 6 4 4 2   Altered sleeping - - 1 - - - -  Tired, decreased energy - - 1 - - - -  Change in appetite - - 1 - - - -  Feeling bad or failure about yourself  - - 1 - - - -  Trouble concentrating - - 1 - - - -  Moving slowly or fidgety/restless - - 1 - - - -  Suicidal thoughts - - 0 - - - -  PHQ-9 Score - - 8 - - - -  Difficult doing work/chores - - Somewhat difficult - - - -      Review of  Systems  Constitutional:  Positive for unexpected weight change.  HENT: Negative.   Eyes: Negative.   Respiratory: Negative.   Cardiovascular: Positive for leg swelling.  Gastrointestinal: Positive for abdominal pain, nausea and vomiting.  Endocrine: Negative.   Genitourinary: Negative.   Musculoskeletal: Positive for back pain and gait problem.  Skin: Negative.   Allergic/Immunologic: Negative.   Neurological: Positive for numbness.  Hematological: Negative.   Psychiatric/Behavioral: Positive for dysphoric mood. The patient is nervous/anxious.        Objective:   Physical Exam Vitals signs and nursing note reviewed.  Constitutional:      Appearance: Normal appearance.  Neck:     Musculoskeletal: Normal range of motion and neck supple.  Cardiovascular:     Rate and Rhythm: Normal rate and regular rhythm.     Pulses: Normal pulses.     Heart sounds: Normal heart sounds.  Pulmonary:     Effort: Pulmonary effort is normal.     Breath sounds: Normal breath sounds.  Musculoskeletal:     Comments: Normal Muscle Bulk and Muscle Testing Reveals:  Upper Extremities: Decreased ROM 90 Degrees  and Muscle Strength Right 3/5 and left 5/5 Lumbar Paraspinal Tenderness L-4-L-5 Mainly Right Side Lower Extremities: Full ROM and Muscle Strength 5/5 Bilateral Lower Extremities Flexion Produces Pain into Bilateral Lower Extremities  Gait   Skin:    General: Skin is warm and dry.  Neurological:     Mental Status: She is alert and oriented to person, place, and time.  Psychiatric:        Mood and Affect: Mood normal.        Behavior: Behavior normal.           Assessment & Plan:  1. MS: Neurology Following. 03/31/2019 2. 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 #120. 03/31/2019 3. Low back pain with Radiculopathy RLE/: Continued topamax.03/31/2019 4. Chronic pancreatitis: PCP  and Gastroenterologist Following 5. Insomnia: Continue Pamelor. 03/31/2019 6. PTSD: Psychiatry Following. Dr. Kieth Brightlyodenbough following.Marland Kitchen. 03/31/2019. 7. Lupus: Rheumatology Following. 03/31/2019 8.ChronicMigraine/ Vascular Headaches: Continue Imitrex,Topamaxand Aimovig.03/31/2019 9. Right Knee Pain:No complaints today.Continue HEP as Tolerated. Continue to Monitor.03/31/2019. 10. Muscle Spasm: Continue Baclofen/ Robaxin. Continue to Monitor.03/31/2019  15 minutes of face to face patient care time was spent during this visit. All questions were encouraged and answered.  F/u in 2 months

## 2019-04-01 ENCOUNTER — Encounter: Payer: Self-pay | Admitting: Registered Nurse

## 2019-04-08 ENCOUNTER — Encounter: Payer: Medicaid Other | Admitting: Psychology

## 2019-04-08 ENCOUNTER — Other Ambulatory Visit: Payer: Self-pay

## 2019-04-08 DIAGNOSIS — F4310 Post-traumatic stress disorder, unspecified: Secondary | ICD-10-CM

## 2019-04-08 DIAGNOSIS — M329 Systemic lupus erythematosus, unspecified: Secondary | ICD-10-CM

## 2019-04-08 DIAGNOSIS — G894 Chronic pain syndrome: Secondary | ICD-10-CM

## 2019-04-11 ENCOUNTER — Encounter: Payer: Self-pay | Admitting: Psychology

## 2019-04-11 ENCOUNTER — Other Ambulatory Visit: Payer: Self-pay | Admitting: Registered Nurse

## 2019-04-11 DIAGNOSIS — G894 Chronic pain syndrome: Secondary | ICD-10-CM
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
## 2019-05-08 ENCOUNTER — Emergency Department (HOSPITAL_COMMUNITY): Payer: Medicaid Other

## 2019-05-08 ENCOUNTER — Encounter (HOSPITAL_COMMUNITY): Payer: Self-pay | Admitting: *Deleted

## 2019-05-08 ENCOUNTER — Other Ambulatory Visit: Payer: Self-pay

## 2019-05-08 ENCOUNTER — Emergency Department (HOSPITAL_COMMUNITY)
Admission: EM | Admit: 2019-05-08 | Discharge: 2019-05-08 | Disposition: A | Discharge status: Home / Self Care | Payer: Medicaid Other | Attending: Emergency Medicine | Admitting: Emergency Medicine

## 2019-05-08 DIAGNOSIS — Z7901 Long term (current) use of anticoagulants: Secondary | ICD-10-CM | POA: Diagnosis not present

## 2019-05-08 DIAGNOSIS — Z87891 Personal history of nicotine dependence: Secondary | ICD-10-CM | POA: Diagnosis not present

## 2019-05-08 DIAGNOSIS — M3210 Systemic lupus erythematosus, organ or system involvement unspecified: Secondary | ICD-10-CM | POA: Insufficient documentation

## 2019-05-08 DIAGNOSIS — Z86711 Personal history of pulmonary embolism: Secondary | ICD-10-CM | POA: Diagnosis not present

## 2019-05-08 DIAGNOSIS — R5383 Other fatigue: Secondary | ICD-10-CM

## 2019-05-08 DIAGNOSIS — Z79899 Other long term (current) drug therapy: Secondary | ICD-10-CM | POA: Diagnosis not present

## 2019-05-08 DIAGNOSIS — Z91040 Latex allergy status: Secondary | ICD-10-CM | POA: Diagnosis not present

## 2019-05-08 DIAGNOSIS — G35 Multiple sclerosis: Secondary | ICD-10-CM | POA: Insufficient documentation

## 2019-05-08 DIAGNOSIS — R531 Weakness: Secondary | ICD-10-CM | POA: Insufficient documentation

## 2019-05-08 DIAGNOSIS — R410 Disorientation, unspecified: Secondary | ICD-10-CM | POA: Diagnosis present

## 2019-05-08 LAB — BASIC METABOLIC PANEL
Anion gap: 10 (ref 5–15)
BUN: 9 mg/dL (ref 6–20)
CO2: 17 mmol/L — ABNORMAL LOW (ref 22–32)
Calcium: 8.6 mg/dL — ABNORMAL LOW (ref 8.9–10.3)
Chloride: 110 mmol/L (ref 98–111)
Creatinine, Ser: 0.68 mg/dL (ref 0.44–1.00)
GFR calc Af Amer: >60 mL/min (ref >60)
GFR calc non Af Amer: >60 mL/min (ref >60)
Glucose, Bld: 144 mg/dL — ABNORMAL HIGH (ref 70–99)
Potassium: 4.1 mmol/L (ref 3.5–5.1)
Sodium: 137 mmol/L (ref 135–145)

## 2019-05-08 LAB — URINALYSIS, ROUTINE W REFLEX MICROSCOPIC
Bilirubin Urine: NEGATIVE
Glucose, UA: NEGATIVE mg/dL
Ketones, ur: NEGATIVE mg/dL
Leukocytes,Ua: NEGATIVE
Nitrite: NEGATIVE
Protein, ur: 30 mg/dL — AB
RBC / HPF: >50 RBC/hpf — ABNORMAL HIGH (ref 0–5)
Specific Gravity, Urine: 1.018 (ref 1.005–1.030)
pH: 6 (ref 5.0–8.0)

## 2019-05-08 LAB — CBC
HCT: 43.8 % (ref 36.0–46.0)
Hemoglobin: 14.1 g/dL (ref 12.0–15.0)
MCH: 28.1 pg (ref 26.0–34.0)
MCHC: 32.2 g/dL (ref 30.0–36.0)
MCV: 87.4 fL (ref 80.0–100.0)
Platelets: 357 10*3/uL (ref 150–400)
RBC: 5.01 MIL/uL (ref 3.87–5.11)
RDW: 20.2 % — ABNORMAL HIGH (ref 11.5–15.5)
WBC: 12.9 10*3/uL — ABNORMAL HIGH (ref 4.0–10.5)
nRBC: 0 % (ref 0.0–0.2)

## 2019-05-08 LAB — CBG MONITORING, ED: Glucose-Capillary: 88 mg/dL (ref 70–99)

## 2019-05-08 LAB — I-STAT BETA HCG BLOOD, ED (MC, WL, AP ONLY): I-stat hCG, quantitative: <5 m[IU]/mL (ref <5)

## 2019-05-08 MED ORDER — SODIUM CHLORIDE 0.9 % IV SOLN: INTRAVENOUS | Status: DC

## 2019-05-08 MED ORDER — SODIUM CHLORIDE 0.9% FLUSH
3.0000 mL | Freq: Once | INTRAVENOUS | Status: AC
  Administered 2019-05-08: 20:00:00 3 mL via INTRAVENOUS

## 2019-05-08 MED ORDER — SODIUM CHLORIDE 0.9 % IV BOLUS
1000.0000 mL | Freq: Once | INTRAVENOUS | Status: AC
  Administered 2019-05-08: 1000 mL via INTRAVENOUS

## 2019-05-08 NOTE — ED Provider Notes (Signed)
[facility] EMERGENCY DEPARTMENT Provider Note   CSN: [id number] Arrival date & time: 05/08/19  1635     History   Chief Complaint Chief Complaint  Patient presents with  . Fatigue    HPI CELITA HEGGER is a 43 y.o. adult.     Patient presents today complaining of decreased level of consciousness.  Patient takes Xarelto for history of PE and saw her doctor this week and had blood work done and did have a CO2 level on chemistries that was low, reportedly 17.  Patient has not had any shortness of breath, fever, cough.  History of migraines and did take Imitrex prior to arrival.  Has a history of prior brain injury and according to the caregiver here her current level of function is is only slightly decreased from baseline.  No reported urinary symptoms.  No recent medication changes.  Was sent here for further evaluation     Past Medical History:  Diagnosis Date  . Abdominal hernia   . Adrenal insufficiency (HCC) 04/23/2013   ??  Marland Kitchen Anxiety   . Depression    "recent breakup with partner of 15 yrs"  . Depression   . GERD (gastroesophageal reflux disease)   . Hernia 03-24-13   ventral hernia remains   . Lupus (HCC)   . MS (multiple sclerosis) (HCC)   . 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 Pancreatic cyst 2002 onset  . Pancreatitis 03-24-13   2002, 2'2013, 03-14-13  . PTSD (post-traumatic stress disorder)   . Ventral hernia     Patient Active Problem List   Diagnosis Date Noted  . PTSD (post-traumatic stress disorder) 10/05/2017  . Chronic pain syndrome 08/05/2017  . History of pancreatitis 06/09/2016  . Rectal bleeding 06/09/2016  . Gastroesophageal reflux disease with esophagitis 06/09/2016  . Right arm weakness   . Stroke-like symptoms   . Headache 11/29/2015  . Major depressive disorder, recurrent episode, moderate (HCC) 07/23/2015  . TIA (transient ischemic attack) 07/22/2015  . Vascular headache 07/22/2015  . Lupus  (HCC) 07/22/2015  . Intractable chronic migraine without aura and without status migrainosus   . Conversion disorder, acute episode, with weakness or paralysis   . Systemic lupus erythematosus related syndrome (HCC) 05/21/2015  . Stuttering   . UTI (lower urinary tract infection) 04/22/2015  . Fever   . Cephalalgia   . Right sided weakness   . Functional neurologic complaint   . Right sided weakness   . White matter changes   . Multiple sclerosis (HCC) 12/29/2014  . Right hemiparesis (HCC) 11/01/2014  . 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 Low back pain 11/01/2014  . 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 Pancreatic pseudocyst 10/02/2014  . Nausea with vomiting 10/02/2014  . Nerve pain 09/25/2014  . Fever 04/01/2014  . Idiopathic acute pancreatitis 03/11/2014  . Nausea and vomiting 03/11/2014  . Abdominal pain, epigastric 03/11/2014  . Adjustment disorder with mixed anxiety and depressed mood 02/23/2014  . Thrush, oral 02/12/2014  . GERD (gastroesophageal reflux disease) 02/11/2014  . Abdominal pain 07/26/2013  . Nausea and vomiting in adult 07/26/2013  . Pancreatitis, recurrent 06/14/2013  . Low serum cortisol level (HCC) 04/25/2013  . ? Adrenal insufficiency- Work up negative 04/23/2013  . Chronic pancreatitis with chronic pseudocysts 04/23/2013  . Abnormal MRI 04/23/2013  . Persistent vomiting 04/22/2013  . Diarrhea 04/21/2013  . Anemia 04/20/2013  . Protein-calorie malnutrition, severe (HCC) 04/16/2013  .  Obesity, morbid (HCC) 03/15/2013  . Pancreatic cyst 03/15/2013  . Acute inflammation of the pancreas 03/15/2013  . Cyst and pseudocyst of pancreas 03/15/2013  . Extreme obesity 03/15/2013    Past Surgical History:  Procedure Laterality Date  . ABDOMINAL SURGERY  ~ 2007   some sort of pancreatic cyst drainage.   . ABDOMINAL SURGERY    . ADENOIDECTOMY    . CHOLECYSTECTOMY     ~ age 17-laparoscopic  . CHOLECYSTECTOMY     . EUS N/A 04/14/2013   Procedure: UPPER ENDOSCOPIC ULTRASOUND (EUS) LINEAR;  Surgeon: Rachael Feeaniel P Jacobs, MD;  Location: [facility];  Service: Endoscopy;  Laterality: N/A;  . HERNIA REPAIR  2017  . ROUX-EN-Y GASTRIC BYPASS    . ROUX-EN-Y PROCEDURE     stent to pancreatic cyst that became infected within 36 hours     OB History    Gravida  0   Para  0   Term  0   Preterm  0   AB  0   Living        SAB  0   TAB  0   Ectopic  0   Multiple      Live Births               Home Medications    Prior to Admission medications   Medication Sig Start Date End Date Taking? Authorizing Provider  AIMOVIG 70 MG/ML SOAJ INJECT 70 MG INTO SKIN EVERY 30 DAYS 03/10/19   Ranelle OysterSwartz, Zachary T, MD  albuterol (PROVENTIL HFA;VENTOLIN HFA) 108 (90 Base) MCG/ACT inhaler Inhale 2 puffs into the lungs every 6 (six) hours as needed for wheezing or shortness of breath.    [provider]  alendronate (FOSAMAX) 70 MG tablet Take 70 mg by mouth once a week. Take with a full glass of water on an empty stomach.    [provider]  aspirin 81 MG chewable tablet Chew 1 tablet (81 mg total) by mouth daily. 04/25/15   Arvilla MarketWallace, Catherine Lauren, DO  baclofen (LIORESAL) 10 MG tablet TAKE ONE TABLET BY MOUTH EVERY NIGHT AT BEDTIME AS NEEDED FOR MUSCLES SPASMS 03/31/19   Jones Baleshomas, Eunice L, NP  clonazePAM (KLONOPIN) 1 MG tablet Take 1 mg by mouth 2 (two) times daily.    [provider]  Erenumab-aooe (AIMOVIG) 70 MG/ML SOAJ Inject 1 mL into the skin every 30 (thirty) days. 03/31/19   Jones Baleshomas, Eunice L, NP  hydroxychloroquine (PLAQUENIL) 200 MG tablet Take 300 mg by mouth at bedtime.     [provider]  ibuprofen (ADVIL,MOTRIN) 200 MG tablet Take 800 mg by mouth daily as needed for headache or moderate pain.    [provider]  lipase/protease/amylase (CREON-12/PANCREASE) 12000 UNITS CPEP capsule Take 2 capsules by mouth 3 (three) times daily with meals. Patient taking  differently: Take 24,000 Units by mouth 3 (three) times daily with meals.  07/31/13   Catarina Hartshornat, David, MD  Melatonin 5 MG TABS Take 1 tablet by mouth at bedtime.     [provider]  methocarbamol (ROBAXIN) 500 MG tablet Take 1 tablet (500 mg total) by mouth daily as needed for muscle spasms. 03/31/19   Jones Baleshomas, Eunice L, NP  nortriptyline (PAMELOR) 25 MG capsule TAKE ONE CAPSULE BY MOUTH DAILY AT BEDTIME 04/12/19   Jones Baleshomas, Eunice L, NP  Oxycodone HCl 10 MG TABS Take 1 tablet (10 mg total) by mouth every 6 (six) hours as needed (severe pain). Do Not Fill Before 05/05/2019 03/31/19  Bayard Hugger, NP  pantoprazole (PROTONIX) 40 MG tablet TAKE ONE TABLET BY MOUTH TWICE A DAY 08/16/18   Irene Shipper, MD  predniSONE (DELTASONE) 5 MG tablet Take 2 tablets (10 mg total) by mouth daily with breakfast. 01/15/18   Mesner, Corene Cornea, MD  promethazine (PHENERGAN) 25 MG suppository Place 1 suppository (25 mg total) rectally every 6 (six) hours as needed for nausea or vomiting. 04/05/18   Sherwood Gambler, MD  promethazine (PHENERGAN) 25 MG tablet TAKE ONE TABLET BY MOUTH DAILY AS NEEDED FOR NAUSEA OR VOMITING 08/16/18   Irene Shipper, MD  rivaroxaban (XARELTO) 20 MG TABS tablet Take 20 mg by mouth daily with supper.    [provider]  sertraline (ZOLOFT) 100 MG tablet Take 200 mg by mouth at bedtime.     [provider]  SUMAtriptan (IMITREX) 100 MG tablet TAKE ONE TABLET BY MOUTH AT ONSET OF HEADACHE; MAY REPEAT ONE TABLET IN 2 HOURS IF NEEDED. 03/25/19   Meredith Staggers, MD  topiramate (TOPAMAX) 100 MG tablet TAKE ONE TABLET BY MOUTH TWICE A DAY 03/23/19   Meredith Staggers, MD  triamcinolone cream (KENALOG) 0.1 % Apply 1 application topically daily as needed (eczema).  02/23/14   [provider]    Family History Family History  Problem Relation Age of Onset  . Diabetes Mother   . Hypertension Mother   . Cervical cancer Mother   . Heart disease Mother   . Diabetes Father   .  Hypertension Father   . Heart disease Father   . Colitis Maternal Aunt   . Diabetes Other        many family members  . CVA Maternal Grandmother   . Lupus Neg Hx   . Sarcoidosis Neg Hx   . Pancreatitis Neg Hx   . Ataxia Neg Hx   . Chorea Neg Hx   . Dementia Neg Hx   . Mental retardation Neg Hx   . Migraines Neg Hx   . Multiple sclerosis Neg Hx   . Neurofibromatosis Neg Hx   . Neuropathy Neg Hx   . Parkinsonism Neg Hx   . Seizures Neg Hx   . Stroke Neg Hx   . Colon cancer Neg Hx     Social History Social History   Tobacco Use  . Smoking status: Former Smoker    Packs/day: 0.50    Years: 20.00    Pack years: 10.00    Types: Cigarettes    Quit date: 05/22/2013    Years since quitting: 5.9  . Smokeless tobacco: Never Used  Substance Use Topics  . Alcohol use: No    Alcohol/week: 0.0 standard drinks  . Drug use: No     Allergies   Amoxicillin, Buprenorphine hcl, Latex, Morphine and related, Penicillins, Meat extract, Morphine and related, and Other   Review of Systems Review of Systems  All other systems reviewed and are negative.    Physical Exam Updated Vital Signs BP (!) 115/93 (BP Location: Left Arm)   Pulse (!) 110   Temp 98.2 F (36.8 C) (Oral)   Resp 16   SpO2 100%   Physical Exam Vitals signs and nursing note reviewed.  Constitutional:      General: She is not in acute distress.    Appearance: Normal appearance. She is well-developed. She is not toxic-appearing.  HENT:     Head: Normocephalic and atraumatic.  Eyes:     General: Lids are normal.  Conjunctiva/sclera: Conjunctivae normal.     Pupils: Pupils are equal, round, and reactive to light.  Neck:     Musculoskeletal: Normal range of motion and neck supple.     Thyroid: No thyroid mass.     Trachea: No tracheal deviation.  Cardiovascular:     Rate and Rhythm: Normal rate and regular rhythm.     Heart sounds: Normal heart sounds. No murmur. No gallop.   Pulmonary:     Effort:  Pulmonary effort is normal. No respiratory distress.     Breath sounds: Normal breath sounds. No stridor. No decreased breath sounds, wheezing, rhonchi or rales.  Abdominal:     General: Bowel sounds are normal. There is no distension.     Palpations: Abdomen is soft.     Tenderness: There is no abdominal tenderness. There is no rebound.  Musculoskeletal: Normal range of motion.        General: No tenderness.  Skin:    General: Skin is warm and dry.     Findings: No abrasion or rash.  Neurological:     General: No focal deficit present.     Mental Status: She is alert and oriented to person, place, and time.     GCS: GCS eye subscore is 4. GCS verbal subscore is 5. GCS motor subscore is 6.     Cranial Nerves: No cranial nerve deficit.     Sensory: No sensory deficit.     Motor: No tremor.     Comments: Strength is 4 5 in upper as well as lower extremities.  Patient slightly confused but very close to baseline.  Psychiatric:        Speech: Speech normal.        Behavior: Behavior normal.      ED Treatments / Results  Labs (all labs ordered are listed, but only abnormal results are displayed) Labs Reviewed  BASIC METABOLIC PANEL  CBC  URINALYSIS, ROUTINE W REFLEX MICROSCOPIC  I-STAT BETA HCG BLOOD, ED ([facility], [facility], [facility] ONLY)  CBG MONITORING, ED    EKG EKG Interpretation  Date/Time:  Sunday May 08 2019 17:28:22 EDT Ventricular Rate:  89 PR Interval:    QRS Duration: 87 QT Interval:  331 QTC Calculation: 403 R Axis:   11 Text Interpretation:  Sinus rhythm Low voltage, precordial leads Confirmed by Lorre NickAllen, Janine Reller ([id number]) on 05/08/2019 5:42:11 PM   Radiology No results found.  Procedures Procedures (including critical care time)  Medications Ordered in ED Medications  sodium chloride flush (NS) 0.9 % injection 3 mL (has no administration in time range)     Initial Impression / Assessment and Plan / ED Course  I have reviewed the triage vital signs and the  nursing notes.  Pertinent labs & imaging results that were available during my care of the patient were reviewed by me and considered in my medical decision making (see chart for details).        Patient's head CT without acute findings here.  Patient's CO2 noted and given 1 L of saline here.  Discussed with patient significant other and states that she is at her baseline more or less.  Hematuria noted but patient is on his menstrual cycle as he is transgender female to female.  Plan is for patient to follow-up with his doctor.  Final Clinical Impressions(s) / ED Diagnoses   Final diagnoses:  None    ED Discharge Orders    None       Lorre NickAllen, Auriah Hollings, MD 05/08/19  2036  

## 2019-05-08 NOTE — ED Triage Notes (Signed)
Pt arrives with partner who states the pt has been acting more confused and lethargic. Says went to PCP and they said "bad carbon monoxide levels in his blood". Imitrex prior to arrival for headache. At baseline, does have some confusion, just more recently.

## 2019-05-11 ENCOUNTER — Other Ambulatory Visit: Payer: Self-pay | Admitting: Internal Medicine

## 2019-05-18 ENCOUNTER — Encounter: Payer: Self-pay | Admitting: Physical Medicine & Rehabilitation

## 2019-05-18 ENCOUNTER — Other Ambulatory Visit: Payer: Self-pay

## 2019-05-18 ENCOUNTER — Telehealth: Payer: Self-pay | Admitting: Physical Medicine & Rehabilitation

## 2019-05-18 ENCOUNTER — Encounter: Payer: Medicaid Other | Attending: Physical Medicine & Rehabilitation | Admitting: Physical Medicine & Rehabilitation

## 2019-05-18 VITALS — Ht 63.0 in | Wt 252.0 lb

## 2019-05-18 DIAGNOSIS — Z79899 Other long term (current) drug therapy: Secondary | ICD-10-CM | POA: Diagnosis not present

## 2019-05-18 DIAGNOSIS — Z5181 Encounter for therapeutic drug level monitoring: Secondary | ICD-10-CM | POA: Diagnosis not present

## 2019-05-18 DIAGNOSIS — K861 Other chronic pancreatitis: Secondary | ICD-10-CM | POA: Insufficient documentation

## 2019-05-18 DIAGNOSIS — G43719 Chronic migraine without aura, intractable, without status migrainosus: Secondary | ICD-10-CM | POA: Diagnosis not present

## 2019-05-18 DIAGNOSIS — M329 Systemic lupus erythematosus, unspecified: Secondary | ICD-10-CM

## 2019-05-18 DIAGNOSIS — M545 Low back pain: Secondary | ICD-10-CM | POA: Insufficient documentation

## 2019-05-18 DIAGNOSIS — M47816 Spondylosis without myelopathy or radiculopathy, lumbar region: Secondary | ICD-10-CM

## 2019-05-18 DIAGNOSIS — G894 Chronic pain syndrome: Secondary | ICD-10-CM | POA: Diagnosis not present

## 2019-05-18 DIAGNOSIS — G35 Multiple sclerosis: Secondary | ICD-10-CM | POA: Insufficient documentation

## 2019-05-18 DIAGNOSIS — R531 Weakness: Secondary | ICD-10-CM

## 2019-05-18 MED ORDER — AIMOVIG 140 MG/ML ~~LOC~~ SOAJ: 140.0000 mg | SUBCUTANEOUS | 6 refills | Status: DC

## 2019-05-18 MED ORDER — OXYCODONE HCL 10 MG PO TABS: 10.0000 mg | ORAL_TABLET | Freq: Four times a day (QID) | ORAL | 0 refills | Status: DC | PRN

## 2019-05-18 NOTE — Progress Notes (Signed)
Subjective:    Patient ID: Mary [name], adult    DOB: March 05, 1976, 43 y.o.   MRN: [id number]  HPI  Due to national recommendations of social distancing because of COVID 17, an audio/video tele-health visit is felt to be the most appropriate encounter for this patient at this time. See MyChart message from today for the patient's consent to a tele-health encounter with [facility]. This is a follow up telephone visit for the patient who is at home. MD is at office.   She is having diarrhea which prevented her from coming today. 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 She has had increased headaches after/with these episodes, minimally responsive to her meds.  She remains on aimovig monthly 70mg . She knows when it's time for the next injection/  She uses imitrex for breakthrough headaches  Pt remains on oxycodone for her baseline pain control  She has a service dog who has helped her be more active. "he helps me get out of bed each day." she is outside a lot more as a result.   She is currently living with her significant other up near [facility]. She likes the open space and relative quiet.      Pain Inventory Average Pain 8 Pain Right Now 8 My pain is constant, sharp and aching  In the last 24 hours, has pain interfered with the following? General activity 8 Relation with others 8 Enjoyment of life 8 What TIME of day is your pain at its worst? evening Sleep (in general) Fair  Pain is worse with: walking, bending, sitting, standing and some activites Pain improves with: rest and medication Relief from Meds: 5  Mobility how many minutes can you walk? unsure ability to climb steps?  yes do you drive?  no  Function disabled: date disabled na I need assistance with the following:  meal prep and household duties  Neuro/Psych spasms dizziness confusion depression anxiety  Prior  Studies CT/MRI  Physicians involved in your care Any changes since last visit?  no   Family History  Problem Relation Age of Onset  . Diabetes Mother   . Hypertension Mother   . Cervical cancer Mother   . Heart disease Mother   . Diabetes Father   . Hypertension Father   . Heart disease Father   . Colitis Maternal Aunt   . Diabetes Other        many family members  . CVA Maternal Grandmother   . Lupus Neg Hx   . Sarcoidosis Neg Hx   . Pancreatitis Neg Hx   . Ataxia Neg Hx   . Chorea Neg Hx   . Dementia Neg Hx   . Mental retardation Neg Hx   . Migraines Neg Hx   . Multiple sclerosis Neg Hx   . Neurofibromatosis Neg Hx   . Neuropathy Neg Hx   . Parkinsonism Neg Hx   . Seizures Neg Hx   . Stroke Neg Hx   . Colon cancer Neg Hx    Social History   Socioeconomic History  . Marital status: Single    Spouse name: Not on file  . Number of children: 0  . Years of education: Not on file  . Highest education level: Not on file  Occupational History  . Not on file  Social Needs  . Financial resource strain: Not on file  . Food insecurity  Worry: Not on file    Inability: Not on file  . Transportation needs    Medical: Not on file    Non-medical: Not on file  Tobacco Use  . Smoking status: Former Smoker    Packs/day: 0.50    Years: 20.00    Pack years: 10.00    Types: Cigarettes    Quit date: 05/22/2013    Years since quitting: 5.9  . Smokeless tobacco: Never Used  Substance and Sexual Activity  . Alcohol use: No    Alcohol/week: 0.0 standard drinks  . Drug use: No  . Sexual activity: Never    Birth control/protection: Abstinence  Lifestyle  . Physical activity    Days per week: Not on file    Minutes per session: Not on file  . Stress: Not on file  Relationships  . Social Musicianconnections    Talks on phone: Not on file    Gets together: Not on file    Attends religious service: Not on file    Active member of club or organization: Not on file    Attends  meetings of clubs or organizations: Not on file    Relationship status: Not on file  Other Topics Concern  . Not on file  Social History Narrative   ** Merged History Encounter **       [place]ives [place] in [place] with mom and stepfather.  Has not started working yet and [place] unemployed in [place].         Past Surgical History:  Procedure Laterality Date  . ABDOMINAL SURGERY  ~ 2007   some sort of pancreatic cyst drainage.   . ABDOMINAL SURGERY    . ADENOIDECTOMY    . CHOLECYSTECTOMY     ~ age 15-laparoscopic  . CHOLECYSTECTOMY    . EUS N/A 04/14/2013   Procedure: UPPER ENDOSCOPIC ULTRASOUND (EUS) LINEAR;  Surgeon: Rachael Feeaniel P Jacobs, MD;  Location: [facility];  Service: Endoscopy;  Laterality: N/A;  . HERNIA REPAIR  2017  . ROUX-EN-Y GASTRIC BYPASS    . ROUX-EN-Y PROCEDURE     stent to pancreatic cyst that became infected within 36 hours   Past Medical History:  Diagnosis Date  . Abdominal hernia   . Adrenal insufficiency (HCC) 04/23/2013   ??  Marland Kitchen. Anxiety   . Depression    "recent breakup with partner of 15 yrs"  . Depression   . GERD (gastroesophageal reflux disease)   . Hernia 03-24-13   ventral hernia remains   . Lupus (HCC)   . MS (multiple sclerosis) (HCC)   . 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 Pancreatic cyst 2002 onset  . Pancreatitis 03-24-13   2002, 2'2013, 03-14-13  . PTSD (post-traumatic stress disorder)   . Ventral hernia    Ht 5\' 3"  (1.6 m) Comment: pt reported, virtual visit  Wt 252 lb (114.3 kg) Comment: pt reported, virtual visit  BMI 44.64 kg/m   Opioid Risk Score:   Fall Risk Score:  `1  Depression screen PHQ 2/9  [facility] 05/18/2019 06/29/2018 04/28/2018 12/28/2017 05/13/2017 04/09/2016 10/01/2015  Decreased Interest 1 1 1 1 3 2 2   Down, Depressed, Hopeless 1 1 1 1 3 2 2   PHQ - 2 Score 2 2 2 2 6 4 4   Altered sleeping - - - 1 - - -  Tired, decreased energy - - - 1 - - -  Change in appetite - - - 1 - - -  Feeling bad or failure about yourself  - - - 1 - - -  Trouble concentrating - - - 1 - - -  Moving slowly or fidgety/restless - - - 1 - - -  Suicidal thoughts - - - 0 - - -  PHQ-9 Score - - - 8 - - -  Difficult doing work/chores - - - Somewhat difficult - - -   Review of Systems  Constitutional: Negative.   HENT: Negative.   Eyes: Negative.   Respiratory: Negative.   Gastrointestinal: Positive for diarrhea.  Endocrine: Negative.   Genitourinary: Negative.   Musculoskeletal: Positive for back pain and myalgias.  Skin: Negative.   Allergic/Immunologic: Negative.   Neurological: Positive for headaches.  Hematological: Negative.   Psychiatric/Behavioral: Negative.   All other systems reviewed and are negative.      Objective:   Physical Exam        Assessment & Plan:   1. Non-specific bicerebral/brainstem white matter disease---initially suspected to be MS, but not characteristic in presentation. LikelySLE.  -continued memory and balance deficits.  2. Chronic pain syndrome related to above with primarily neuropathic right sided pain. She has had a recent increase in cervical pain and related headaches over the last several months as well.  3. Low back pain with symptoms more in the left low back and buttock.  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 Chronic pancreatitis  5. Conversion disorder/PTSD---at the core of many of her problems it appears 6. Chronic migraines/vascular headaches   Plan:  1.Continuetopamax 100mg  am and 100mg  qpm for headaches and neuropathic pain.  -maintainimitrex to 100mg  for breakthrough migraine -aimovig helping. Will increase to 140mg  dose to see if we can provide more continuous relief and even help with her "lightning" headaches associated with her dissociative episodes 2. Continue psychiatry follow up. Many of her neurological symptoms are  likely non-organic in nature given her recent spontaneous improvement and relapse. Pt/family aware -  pamelor 25mg  -maintin follow up with Dr. - -agree with service dog. This wis reat from an emotional and physical stand point. Is motivating her to be more active 3. Oxycodone was refilled. #120 10mg  was refilled today. -We will continue the controlled substance monitoring program, this consists of regular clinic visits, examinations, routine drug screening, pill counts as well as use of Controlled Substance Reporting System. [place].   -Medication was refilled and a second prescription was sent to the patient's pharmacy for next month.   4.Insomnia:continue nortriptyline -Medication was refilled and a second prescription was sent to the patient's pharmacy for next month. 4.Sleep hygiene: making some improvements here 7. Rheumatology follow up as directed.            8. Follow up withNP2.5 months. of tele-time were spent during this visit. All questions were encouraged and answered.

## 2019-05-18 NOTE — Telephone Encounter (Signed)
error 

## 2019-05-25 ENCOUNTER — Ambulatory Visit: Payer: Medicaid Other | Admitting: Physical Medicine & Rehabilitation

## 2019-06-01 ENCOUNTER — Ambulatory Visit: Payer: Medicaid Other | Admitting: Physical Medicine & Rehabilitation

## 2019-06-10 ENCOUNTER — Other Ambulatory Visit: Payer: Self-pay | Admitting: Internal Medicine

## 2019-06-11 ENCOUNTER — Emergency Department (HOSPITAL_BASED_OUTPATIENT_CLINIC_OR_DEPARTMENT_OTHER)
Admission: EM | Admit: 2019-06-11 | Discharge: 2019-06-12 | Disposition: A | Discharge status: Home / Self Care | Payer: Medicaid Other | Attending: Emergency Medicine | Admitting: Emergency Medicine

## 2019-06-11 ENCOUNTER — Other Ambulatory Visit: Payer: Self-pay

## 2019-06-11 ENCOUNTER — Emergency Department (HOSPITAL_BASED_OUTPATIENT_CLINIC_OR_DEPARTMENT_OTHER): Payer: Medicaid Other

## 2019-06-11 ENCOUNTER — Encounter (HOSPITAL_BASED_OUTPATIENT_CLINIC_OR_DEPARTMENT_OTHER): Payer: Self-pay

## 2019-06-11 DIAGNOSIS — Z79899 Other long term (current) drug therapy: Secondary | ICD-10-CM | POA: Insufficient documentation

## 2019-06-11 DIAGNOSIS — G35 Multiple sclerosis: Secondary | ICD-10-CM | POA: Insufficient documentation

## 2019-06-11 DIAGNOSIS — Z7982 Long term (current) use of aspirin: Secondary | ICD-10-CM | POA: Diagnosis not present

## 2019-06-11 DIAGNOSIS — F17210 Nicotine dependence, cigarettes, uncomplicated: Secondary | ICD-10-CM | POA: Diagnosis not present

## 2019-06-11 DIAGNOSIS — M329 Systemic lupus erythematosus, unspecified: Secondary | ICD-10-CM | POA: Insufficient documentation

## 2019-06-11 DIAGNOSIS — Z20828 Contact with and (suspected) exposure to other viral communicable diseases: Secondary | ICD-10-CM | POA: Diagnosis not present

## 2019-06-11 DIAGNOSIS — Z91040 Latex allergy status: Secondary | ICD-10-CM | POA: Insufficient documentation

## 2019-06-11 DIAGNOSIS — Z87890 Personal history of sex reassignment: Secondary | ICD-10-CM | POA: Insufficient documentation

## 2019-06-11 DIAGNOSIS — R05 Cough: Secondary | ICD-10-CM | POA: Diagnosis present

## 2019-06-11 DIAGNOSIS — B349 Viral infection, unspecified: Secondary | ICD-10-CM | POA: Insufficient documentation

## 2019-06-11 NOTE — ED Triage Notes (Signed)
Cough, runny nose, sore throat, body aches, HA, and a lost sense of smell that started today.

## 2019-06-11 NOTE — ED Notes (Signed)
ED Provider at bedside. 

## 2019-06-11 NOTE — ED Provider Notes (Signed)
[facility] Provider Note: Mary Dell, MD, FACEP  CSN: [id number] MRN: [id number] ARRIVAL: 06/11/19 at 2237 ROOM: MH11/MH11   CHIEF COMPLAINT  Viral Like Illness  Level 5 caveat: Intellectually impaired; stepmother's principal historian HISTORY OF PRESENT ILLNESS  06/11/19 11:35 PM BELYNDA [name] is a 43 y.o. adult (female to female transgender) with multiple medical problems including multiple sclerosis, adrenal insufficiency and lupus.  The patient is here with cough, wheezing, nasal congestion, sore throat, body aches, headache and loss of sense of smell that began this morning.  No fever at home and temperature was noted to be 99 degrees here.  The patient rates their pain as 8 out of 10.    Past Medical History:  Diagnosis Date   Abdominal hernia    Adrenal insufficiency (HCC) 04/23/2013   ??   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    ABDOMINAL SURGERY     ADENOIDECTOMY     CHOLECYSTECTOMY     ~ age 89-laparoscopic   CHOLECYSTECTOMY     EUS N/A 04/14/2013   Procedure: UPPER ENDOSCOPIC ULTRASOUND (EUS) LINEAR;  Surgeon: Rachael Fee, MD;  Location: [facility];  Service: Endoscopy;  Laterality: N/A;   HERNIA REPAIR  2017   ROUX-EN-Y GASTRIC BYPASS     ROUX-EN-Y PROCEDURE     stent to pancreatic cyst that became infected within 36 hours    Family History  Problem Relation Age of Onset   Diabetes Mother    Hypertension Mother     Cervical cancer Mother    Heart disease Mother    Diabetes Father    Hypertension Father    Heart disease Father    Colitis Maternal Aunt    Diabetes Other        many family members   CVA Maternal Grandmother    Lupus Neg Hx    Sarcoidosis Neg Hx    Pancreatitis Neg Hx    Ataxia Neg Hx    Chorea Neg Hx    Dementia Neg Hx    Mental retardation Neg Hx    Migraines Neg Hx    Multiple sclerosis Neg Hx    Neurofibromatosis Neg Hx    Neuropathy Neg Hx    Parkinsonism Neg Hx    Seizures Neg Hx    Stroke Neg Hx    Colon cancer Neg Hx     Social History   Tobacco Use   Smoking status: Former Smoker    Packs/day: 0.50    Years: 20.00    Pack years: 10.00    Types: Cigarettes    Quit date: 05/22/2013    Years since quitting: 6.0   Smokeless tobacco: Never Used  Substance Use Topics   Alcohol use: No    Alcohol/week: 0.0 standard drinks   Drug use: No    Prior to Admission medications   Medication Sig Start  Date End Date Taking? Authorizing Provider  albuterol (PROVENTIL HFA;VENTOLIN HFA) 108 (90 Base) MCG/ACT inhaler Inhale 2 puffs into the lungs every 6 (six) hours as needed for wheezing or shortness of breath.    [provider]  alendronate (FOSAMAX) 70 MG tablet Take 70 mg by mouth every Wednesday. Take with a full glass of water on an empty stomach.     [provider]  aspirin 81 MG chewable tablet Chew 1 tablet (81 mg total) by mouth daily. 04/25/15   Arvilla Market, DO  baclofen (LIORESAL) 10 MG tablet TAKE ONE TABLET BY MOUTH EVERY NIGHT AT BEDTIME AS NEEDED FOR MUSCLES SPASMS Patient taking differently: Take 10 mg by mouth at bedtime. For muscle spasms 03/31/19   Jones Bales, NP  clonazePAM (KLONOPIN) 1 MG tablet Take 1 mg by mouth 2 (two) times daily.    [provider]  Erenumab-aooe (AIMOVIG) 140 MG/ML SOAJ Inject 140 mg into the skin every 30 (thirty) days. 05/18/19   Ranelle Oyster, MD  ferrous  sulfate 325 (65 FE) MG tablet Take 325 mg by mouth at bedtime.    [provider]  hydroxychloroquine (PLAQUENIL) 200 MG tablet Take 300 mg by mouth at bedtime.     [provider]  ibuprofen (ADVIL,MOTRIN) 200 MG tablet Take 800 mg by mouth daily as needed for headache or moderate pain.    [provider]  levonorgestrel (MIRENA) 20 MCG/24HR IUD 1 each by Intrauterine route once. Implanted 03/31/19    [provider]  lipase/protease/amylase (CREON-12/PANCREASE) 12000 UNITS CPEP capsule Take 2 capsules by mouth 3 (three) times daily with meals. Patient taking differently: Take 24,000 Units by mouth 3 (three) times daily with meals.  07/31/13   Catarina Hartshorn, MD  Melatonin 5 MG TABS Take 5 mg by mouth at bedtime.     [provider]  methocarbamol (ROBAXIN) 500 MG tablet Take 1 tablet (500 mg total) by mouth daily as needed for muscle spasms. Patient taking differently: Take 500 mg by mouth every morning.  03/31/19   Jones Bales, NP  norethindrone (AYGESTIN) 5 MG tablet Take 5-10 mg by mouth See admin instructions. Start date 03/31/19: take 2 tablets (10 mg) by mouth twice daily for 2 weeks, then take 2 tablets (10 mg) every morning and 1 tablet (5 mg) at night for 2 weeks, then take 1 tablet (5 mg) twice daily for 2 weeks, then take 1 tablet (5 mg) daily for 2 weeks, then stop. 03/31/19   [provider]  nortriptyline (PAMELOR) 25 MG capsule TAKE ONE CAPSULE BY MOUTH DAILY AT BEDTIME Patient taking differently: Take by mouth at bedtime.  04/12/19   Jones Bales, NP  ondansetron (ZOFRAN ODT) 8 MG disintegrating tablet Take 1 tablet (8 mg total) by mouth every 8 (eight) hours as needed for nausea or vomiting. 06/12/19   Mattias Walmsley, MD  Oxycodone HCl 10 MG TABS Take 1 tablet (10 mg total) by mouth every 6 (six) hours as needed (severe pain). 05/18/19   Ranelle Oyster, MD  pantoprazole (PROTONIX) 40 MG tablet TAKE ONE TABLET BY MOUTH TWICE A  DAY Patient taking differently: Take 40 mg by mouth 2 (two) times daily.  08/16/18   Hilarie Fredrickson, MD  predniSONE (DELTASONE) 5 MG tablet Take 2 tablets (10 mg total) by mouth daily with breakfast. 01/15/18   Mesner, Barbara Cower, MD  promethazine (PHENERGAN) 25 MG suppository Place 1 suppository (25 mg total) rectally every 6 (  six) hours as needed for nausea or vomiting. 04/05/18   Pricilla LovelessGoldston, Scott, MD  promethazine (PHENERGAN) 25 MG tablet TAKE ONE TABLET BY MOUTH DAILY AS NEEDED FOR NAUSEA OR VOMITING Patient taking differently: Take 25 mg by mouth 3 (three) times daily as needed for nausea or vomiting.  08/16/18   Hilarie FredricksonPerry, Kelsy Polack N, MD  rivaroxaban (XARELTO) 20 MG TABS tablet Take 20 mg by mouth daily with breakfast.     [provider]  sertraline (ZOLOFT) 100 MG tablet Take 200 mg by mouth at bedtime.     [provider]  SUMAtriptan (IMITREX) 100 MG tablet TAKE ONE TABLET BY MOUTH AT ONSET OF HEADACHE; MAY REPEAT ONE TABLET IN 2 HOURS IF NEEDED. Patient taking differently: Take 100 mg by mouth See admin instructions. Take one tablet (100 mg) by mouth at onset of headache, may repeat once in 2 hours if still needed 03/25/19   Ranelle OysterSwartz, Zachary T, MD  topiramate (TOPAMAX) 100 MG tablet TAKE ONE TABLET BY MOUTH TWICE A DAY Patient taking differently: Take 100 mg by mouth 2 (two) times daily.  03/23/19   Ranelle OysterSwartz, Zachary T, MD  triamcinolone cream (KENALOG) 0.1 % Apply 1 application topically daily as needed (eczema).  02/23/14   [provider]    Allergies Amoxicillin, Buprenorphine hcl, Latex, Morphine and related, Penicillins, Meat extract, and Other   REVIEW OF SYSTEMS    PHYSICAL EXAMINATION  Initial Vital Signs Blood pressure (!) 150/89, pulse 99, temperature 99 F (37.2 C), temperature source Oral, resp. rate (!) 24, height 5\' 3"  (1.6 m), weight 113.4 kg, last menstrual period 05/29/2019, SpO2 95 %.  Examination General: Well-developed, obese adult in no acute distress;  appearance consistent with age of record HENT: normocephalic; atraumatic Eyes: pupils equal, round and reactive to light; extraocular muscles intact Neck: supple Heart: regular rate and rhythm Lungs: Decreased air movement bilaterally without frank wheezing; occasional cough Abdomen: soft; nondistended; nontender; bowel sounds present Extremities: No deformity; full range of motion; pulses normal Neurologic: Lethargic; nonverbal; noted to move all extremities Skin: Warm and dry Psychiatric: Normal mood and affect   RESULTS  Summary of this visit's results, reviewed and interpreted by myself:   EKG Interpretation  Date/Time:    Ventricular Rate:    PR Interval:    QRS Duration:   QT Interval:    QTC Calculation:   R Axis:     Text Interpretation:        Laboratory Studies: Results for orders placed or performed during the hospital encounter of 06/11/19 (from the past 24 hour(s))  SARS Coronavirus 2 by RT PCR (hospital order, [facility]) Nasopharyngeal Nasopharyngeal Swab     Status: None   Collection Time: 06/11/19 11:46 PM   Specimen: Nasopharyngeal Swab  Result Value Ref Range   SARS Coronavirus 2 NEGATIVE NEGATIVE  CBC with Differential/Platelet     Status: Abnormal   Collection Time: 06/12/19 12:42 AM  Result Value Ref Range   WBC 11.6 (H) 4.0 - 10.5 K/uL   RBC 4.07 3.87 - 5.11 MIL/uL   Hemoglobin 11.6 (L) 12.0 - 15.0 g/dL   HCT 16.137.7 09.636.0 - 04.546.0 %   MCV 92.6 80.0 - 100.0 fL   MCH 28.5 26.0 - 34.0 pg   MCHC 30.8 30.0 - 36.0 g/dL   RDW 40.915.8 (H) 81.111.5 - 91.415.5 %   Platelets 414 (H) 150 - 400 K/uL   nRBC 0.0 0.0 - 0.2 %   Neutrophils Relative %  70 %   Neutro Abs 8.1 (H) 1.7 - 7.7 K/uL   Lymphocytes Relative 19 %   Lymphs Abs 2.3 0.7 - 4.0 K/uL   Monocytes Relative 8 %   Monocytes Absolute 0.9 0.1 - 1.0 K/uL   Eosinophils Relative 2 %   Eosinophils Absolute 0.2 0.0 - 0.5 K/uL   Basophils Relative 1 %   Basophils Absolute 0.1 0.0 - 0.1  K/uL   Immature Granulocytes 0 %   Abs Immature Granulocytes 0.04 0.00 - 0.07 K/uL  Basic metabolic panel     Status: Abnormal   Collection Time: 06/12/19 12:42 AM  Result Value Ref Range   Sodium 141 135 - 145 mmol/L   Potassium 3.6 3.5 - 5.1 mmol/L   Chloride 114 (H) 98 - 111 mmol/L   CO2 20 (L) 22 - 32 mmol/L   Glucose, Bld 116 (H) 70 - 99 mg/dL   BUN 7 6 - 20 mg/dL   Creatinine, Ser 0.980.72 0.44 - 1.00 mg/dL   Calcium 8.5 (L) 8.9 - 10.3 mg/dL   GFR calc non Af Amer >60 >60 mL/min   GFR calc Af Amer >60 >60 mL/min   Anion gap 7 5 - 15  Pregnancy, urine     Status: None   Collection Time: 06/12/19  1:23 AM  Result Value Ref Range   Preg Test, Ur NEGATIVE NEGATIVE  Urinalysis, Routine w reflex microscopic     Status: Abnormal   Collection Time: 06/12/19  1:23 AM  Result Value Ref Range   Color, Urine YELLOW YELLOW   APPearance CLEAR CLEAR   Specific Gravity, Urine 1.020 1.005 - 1.030   pH 6.5 5.0 - 8.0   Glucose, UA NEGATIVE NEGATIVE mg/dL   Hgb urine dipstick TRACE (A) NEGATIVE   Bilirubin Urine NEGATIVE NEGATIVE   Ketones, ur NEGATIVE NEGATIVE mg/dL   Protein, ur NEGATIVE NEGATIVE mg/dL   Nitrite NEGATIVE NEGATIVE   Leukocytes,Ua NEGATIVE NEGATIVE  Urinalysis, Microscopic (reflex)     Status: Abnormal   Collection Time: 06/12/19  1:23 AM  Result Value Ref Range   RBC / HPF 0-5 0 - 5 RBC/hpf   WBC, UA 0-5 0 - 5 WBC/hpf   Bacteria, UA MANY (A) NONE SEEN   Squamous Epithelial / LPF 0-5 0 - 5   Imaging Studies: Dg Chest Port 1 View  Result Date: 06/12/2019 CLINICAL DATA:  43 year old female with cough. EXAM: PORTABLE CHEST 1 VIEW COMPARISON:  Chest radiograph dated 08/17/2015 FINDINGS: The heart size and mediastinal contours are within normal limits. Both lungs are clear. The visualized skeletal structures are unremarkable. IMPRESSION: No active disease. Electronically Signed   By: Elgie CollardArash  Radparvar M.D.   On: 06/12/2019 00:50    ED COURSE and MDM  Nursing notes, initial  and subsequent vitals signs, including pulse oximetry, reviewed and interpreted by myself.  Vitals:   06/11/19 2243 06/11/19 2245 06/11/19 2343 06/12/19 0254  BP: (!) 150/89  (!) 159/94 114/80  Pulse: 99  86 83  Resp: (!) 24  20 18   Temp: 99 F (37.2 C)  99.2 F (37.3 C)   TempSrc: Oral  Oral   SpO2: 95%  100% 100%  Weight:  113.4 kg    Height:  5\' 3"  (1.6 m)     Medications  ondansetron (ZOFRAN-ODT) disintegrating tablet 8 mg (has no administration in time range)  ondansetron (ZOFRAN) injection 4 mg (4 mg Intravenous Given 06/12/19 0203)  ketorolac (TORADOL) injection 30 mg (30 mg Intramuscular Given 06/12/19  0214)    Janiyla Long Baby was evaluated in Emergency Department on 06/12/2019 for the symptoms described in the history of present illness. He was evaluated in the context of the global COVID-19 pandemic, which necessitated consideration that the patient might be at risk for infection with the SARS-CoV-2 virus that causes COVID-19. Institutional protocols and algorithms that pertain to the evaluation of patients at risk for COVID-19 are in a state of rapid change based on information released by regulatory bodies including the CDC and federal and state organizations. These policies and algorithms were followed during the patient's care in the ED.  2:58 AM Patient negative for Covid.  She became nauseated and started vomiting in the ED.  This was improved with Zofran and she was also given IM Toradol with improvement in her body aches.  Symptoms are consistent with a viral illness.  Her COVID-19 test is negative which is reassuring but these tests are not 100% sensitive.  PROCEDURES  Procedures   ED DIAGNOSES     ICD-10-CM   1. Viral illness  B34.9        Sohrab Keelan, MD 06/12/19 0300

## 2019-06-12 LAB — URINALYSIS, ROUTINE W REFLEX MICROSCOPIC
Bilirubin Urine: NEGATIVE
Glucose, UA: NEGATIVE mg/dL
Ketones, ur: NEGATIVE mg/dL
Leukocytes,Ua: NEGATIVE
Nitrite: NEGATIVE
Protein, ur: NEGATIVE mg/dL
Specific Gravity, Urine: 1.02 (ref 1.005–1.030)
pH: 6.5 (ref 5.0–8.0)

## 2019-06-12 LAB — CBC WITH DIFFERENTIAL/PLATELET
Abs Immature Granulocytes: 0.04 10*3/uL (ref 0.00–0.07)
Basophils Absolute: 0.1 10*3/uL (ref 0.0–0.1)
Basophils Relative: 1 %
Eosinophils Absolute: 0.2 10*3/uL (ref 0.0–0.5)
Eosinophils Relative: 2 %
HCT: 37.7 % (ref 36.0–46.0)
Hemoglobin: 11.6 g/dL — ABNORMAL LOW (ref 12.0–15.0)
Immature Granulocytes: 0 %
Lymphocytes Relative: 19 %
Lymphs Abs: 2.3 10*3/uL (ref 0.7–4.0)
MCH: 28.5 pg (ref 26.0–34.0)
MCHC: 30.8 g/dL (ref 30.0–36.0)
MCV: 92.6 fL (ref 80.0–100.0)
Monocytes Absolute: 0.9 10*3/uL (ref 0.1–1.0)
Monocytes Relative: 8 %
Neutro Abs: 8.1 10*3/uL — ABNORMAL HIGH (ref 1.7–7.7)
Neutrophils Relative %: 70 %
Platelets: 414 10*3/uL — ABNORMAL HIGH (ref 150–400)
RBC: 4.07 MIL/uL (ref 3.87–5.11)
RDW: 15.8 % — ABNORMAL HIGH (ref 11.5–15.5)
WBC: 11.6 10*3/uL — ABNORMAL HIGH (ref 4.0–10.5)
nRBC: 0 % (ref 0.0–0.2)

## 2019-06-12 LAB — BASIC METABOLIC PANEL
Anion gap: 7 (ref 5–15)
BUN: 7 mg/dL (ref 6–20)
CO2: 20 mmol/L — ABNORMAL LOW (ref 22–32)
Calcium: 8.5 mg/dL — ABNORMAL LOW (ref 8.9–10.3)
Chloride: 114 mmol/L — ABNORMAL HIGH (ref 98–111)
Creatinine, Ser: 0.72 mg/dL (ref 0.44–1.00)
GFR calc Af Amer: >60 mL/min (ref >60)
GFR calc non Af Amer: >60 mL/min (ref >60)
Glucose, Bld: 116 mg/dL — ABNORMAL HIGH (ref 70–99)
Potassium: 3.6 mmol/L (ref 3.5–5.1)
Sodium: 141 mmol/L (ref 135–145)

## 2019-06-12 LAB — SARS CORONAVIRUS 2 BY RT PCR (HOSPITAL ORDER, PERFORMED IN ~~LOC~~ HOSPITAL LAB): SARS Coronavirus 2: NEGATIVE

## 2019-06-12 LAB — URINALYSIS, MICROSCOPIC (REFLEX)

## 2019-06-12 LAB — PREGNANCY, URINE: Preg Test, Ur: NEGATIVE

## 2019-06-12 MED ORDER — ONDANSETRON 8 MG PO TBDP: 8.0000 mg | ORAL_TABLET | Freq: Three times a day (TID) | ORAL | 0 refills | Status: DC | PRN

## 2019-06-12 MED ORDER — ONDANSETRON HCL 4 MG/2ML IJ SOLN
INTRAMUSCULAR | Status: AC
  Filled 2019-06-12: qty 2

## 2019-06-12 MED ORDER — ONDANSETRON 8 MG PO TBDP: 8.0000 mg | ORAL_TABLET | Freq: Once | ORAL | Status: DC

## 2019-06-12 MED ORDER — ONDANSETRON HCL 4 MG/2ML IJ SOLN
4.0000 mg | Freq: Once | INTRAMUSCULAR | Status: AC
  Administered 2019-06-12: 02:00:00 4 mg via INTRAVENOUS

## 2019-06-12 MED ORDER — ONDANSETRON HCL 4 MG/2ML IJ SOLN: 4.0000 mg | Freq: Once | INTRAMUSCULAR | Status: DC

## 2019-06-12 MED ORDER — KETOROLAC TROMETHAMINE 60 MG/2ML IM SOLN
30.0000 mg | Freq: Once | INTRAMUSCULAR | Status: AC
  Administered 2019-06-12: 02:00:00 30 mg via INTRAMUSCULAR
  Filled 2019-06-12: qty 2

## 2019-06-12 NOTE — ED Notes (Signed)
Bedside commode placed in room. 

## 2019-06-12 NOTE — ED Notes (Signed)
Reports feeling better and states ready to go home. Will notify EDP.

## 2019-06-14 ENCOUNTER — Other Ambulatory Visit: Payer: Self-pay | Admitting: Internal Medicine

## 2019-07-13 ENCOUNTER — Other Ambulatory Visit: Payer: Self-pay | Admitting: Internal Medicine

## 2019-07-13 ENCOUNTER — Other Ambulatory Visit: Payer: Self-pay | Admitting: Registered Nurse

## 2019-07-13 DIAGNOSIS — G894 Chronic pain syndrome: Secondary | ICD-10-CM

## 2019-07-21 ENCOUNTER — Encounter: Payer: Medicaid Other | Admitting: Registered Nurse

## 2019-07-21 ENCOUNTER — Telehealth: Payer: Self-pay | Admitting: *Deleted

## 2019-07-21 DIAGNOSIS — M329 Systemic lupus erythematosus, unspecified: Secondary | ICD-10-CM

## 2019-07-21 DIAGNOSIS — G894 Chronic pain syndrome: Secondary | ICD-10-CM

## 2019-07-21 DIAGNOSIS — Z5181 Encounter for therapeutic drug level monitoring: Secondary | ICD-10-CM

## 2019-07-21 DIAGNOSIS — R531 Weakness: Secondary | ICD-10-CM

## 2019-07-21 DIAGNOSIS — Z79899 Other long term (current) drug therapy: Secondary | ICD-10-CM

## 2019-07-21 MED ORDER — OXYCODONE HCL 10 MG PO TABS: 10.0000 mg | ORAL_TABLET | Freq: Four times a day (QID) | ORAL | 0 refills | Status: DC | PRN

## 2019-07-21 NOTE — Telephone Encounter (Signed)
We have had to cancel [facility] appointment today with Zella Ball and will reschedule for end of December. Zella Ball will send her medications to the pharmacy. Last fill date for oxycodone was 06/21/19 per pmp.

## 2019-07-21 NOTE — Telephone Encounter (Signed)
PMP was Reviewed: Oxycodone e-scribed today. Placed a call to Ms. De-hart regarding the above, she verbalizes understanding.

## 2019-08-08 ENCOUNTER — Encounter: Payer: Medicaid Other | Admitting: Psychology

## 2019-08-09 ENCOUNTER — Encounter: Payer: Medicaid Other | Attending: Physical Medicine & Rehabilitation | Admitting: Registered Nurse

## 2019-08-09 ENCOUNTER — Other Ambulatory Visit: Payer: Self-pay

## 2019-08-09 VITALS — Ht 63.0 in | Wt 242.0 lb

## 2019-08-09 DIAGNOSIS — M545 Low back pain: Secondary | ICD-10-CM | POA: Insufficient documentation

## 2019-08-09 DIAGNOSIS — M329 Systemic lupus erythematosus, unspecified: Secondary | ICD-10-CM | POA: Insufficient documentation

## 2019-08-09 DIAGNOSIS — Z5181 Encounter for therapeutic drug level monitoring: Secondary | ICD-10-CM | POA: Insufficient documentation

## 2019-08-09 DIAGNOSIS — M47816 Spondylosis without myelopathy or radiculopathy, lumbar region: Secondary | ICD-10-CM

## 2019-08-09 DIAGNOSIS — G894 Chronic pain syndrome: Secondary | ICD-10-CM | POA: Insufficient documentation

## 2019-08-09 DIAGNOSIS — M5416 Radiculopathy, lumbar region: Secondary | ICD-10-CM | POA: Diagnosis not present

## 2019-08-09 DIAGNOSIS — M546 Pain in thoracic spine: Secondary | ICD-10-CM

## 2019-08-09 DIAGNOSIS — Z79899 Other long term (current) drug therapy: Secondary | ICD-10-CM | POA: Insufficient documentation

## 2019-08-09 DIAGNOSIS — F5101 Primary insomnia: Secondary | ICD-10-CM

## 2019-08-09 DIAGNOSIS — G4700 Insomnia, unspecified: Secondary | ICD-10-CM

## 2019-08-09 DIAGNOSIS — M25561 Pain in right knee: Secondary | ICD-10-CM

## 2019-08-09 DIAGNOSIS — G8929 Other chronic pain: Secondary | ICD-10-CM

## 2019-08-09 DIAGNOSIS — G35 Multiple sclerosis: Secondary | ICD-10-CM

## 2019-08-09 DIAGNOSIS — M62838 Other muscle spasm: Secondary | ICD-10-CM

## 2019-08-09 DIAGNOSIS — Z79891 Long term (current) use of opiate analgesic: Secondary | ICD-10-CM

## 2019-08-09 DIAGNOSIS — F4310 Post-traumatic stress disorder, unspecified: Secondary | ICD-10-CM

## 2019-08-09 DIAGNOSIS — G43909 Migraine, unspecified, not intractable, without status migrainosus: Secondary | ICD-10-CM

## 2019-08-09 DIAGNOSIS — G43719 Chronic migraine without aura, intractable, without status migrainosus: Secondary | ICD-10-CM | POA: Insufficient documentation

## 2019-08-09 DIAGNOSIS — K861 Other chronic pancreatitis: Secondary | ICD-10-CM | POA: Insufficient documentation

## 2019-08-09 DIAGNOSIS — R531 Weakness: Secondary | ICD-10-CM | POA: Insufficient documentation

## 2019-08-09 MED ORDER — SUMATRIPTAN SUCCINATE 100 MG PO TABS: ORAL_TABLET | ORAL | 2 refills | Status: DC

## 2019-08-09 MED ORDER — OXYCODONE HCL 10 MG PO TABS: 10.0000 mg | ORAL_TABLET | Freq: Four times a day (QID) | ORAL | 0 refills | Status: DC | PRN

## 2019-08-09 NOTE — Progress Notes (Signed)
Subjective:    Patient ID: Mary [name], adult    DOB: [birth year], 43 y.o.   MRN: [id number]  HPI: Mary [name] is a 43 y.o. female whose appointment was changed to a virtual office visit to reduce the risk of exposure to the COVID-19 virus and to help Mary [name] remain healthy and safe. She also reports she's experiencing vomiting, diarrhea and lack of appetite, PCP following she reports. The virtual visit will also provide continuity of care. Mary [name] agrees to virtual visit and verbalizes understanding. She states her pain is located in her upper- lower back mainly right side, Also reports she has a toothache today. She rates her pain 8. Her current exercise regime is walking and performing household chores.  Mary [name] Morphine equivalent is 75.00 MME. She  is also prescribed Clonazepam by Peyton Najjar. We have discussed the black box warning of using opioids and benzodiazepines. I highlighted the dangers of using these drugs together and discussed the adverse events including respiratory suppression, overdose, cognitive impairment and importance of compliance with current regimen. We will continue to monitor and adjust as indicated.   She is being closely monitored and under the care of her psychiatrist.  Mary [name] CMA asked the Health and History Questions. This provider and Mary [name] verified we were speaking with the correct person using two identifiers.   Pain Inventory Average Pain 8 Pain Right Now 8 My pain is constant, sharp and aching  In the last 24 hours, has pain interfered with the following? General activity 8 Relation with others 8 Enjoyment of life 8 What TIME of day is your pain at its worst? evening Sleep (in general) Fair  Pain is worse with: walking, bending, sitting, standing and some activites Pain improves with: rest and medication Relief from Meds: 5  Mobility how many minutes can you walk? unsure ability to climb steps?  yes do you drive?   no  Function disabled: date disabled . I need assistance with the following:  meal prep and household duties  Neuro/Psych spasms dizziness confusion depression anxiety  Prior Studies CT/MRI  Physicians involved in your care Any changes since last visit?  no   Family History  Problem Relation Age of Onset  . Diabetes Mother   . Hypertension Mother   . Cervical cancer Mother   . Heart disease Mother   . Diabetes Father   . Hypertension Father   . Heart disease Father   . Colitis Maternal Aunt   . Diabetes Other        many family members  . CVA Maternal Grandmother   . Lupus Neg Hx   . Sarcoidosis Neg Hx   . Pancreatitis Neg Hx   . Ataxia Neg Hx   . Chorea Neg Hx   . Dementia Neg Hx   . Mental retardation Neg Hx   . Migraines Neg Hx   . Multiple sclerosis Neg Hx   . Neurofibromatosis Neg Hx   . Neuropathy Neg Hx   . Parkinsonism Neg Hx   . Seizures Neg Hx   . Stroke Neg Hx   . Colon cancer Neg Hx    Social History   Socioeconomic History  . Marital status: Single    Spouse name: Not on file  . Number of children: 0  . Years of education: Not on file  . Highest education level: Not on file  Occupational History  . Not on file  Tobacco Use  . Smoking status:  Former Smoker    Packs/day: 0.50    Years: 20.00    Pack years: 10.00    Types: Cigarettes    Quit date: 05/22/2013    Years since quitting: 6.2  . Smokeless tobacco: Never Used  Substance and Sexual Activity  . Alcohol use: No    Alcohol/week: 0.0 standard drinks  . Drug use: No  . Sexual activity: Never    Birth control/protection: Abstinence  Other Topics Concern  . Not on file  Social History Narrative   ** Merged History Encounter **       Lives permanently in [place] with mom and stepfather.  Has not started working yet and was unemployed in [place].         Social Determinants of Health   Financial Resource Strain:   . Difficulty of Paying Living Expenses: Not on file  Food  Insecurity:   . Worried About Programme researcher, broadcasting/film/videounning Out of Food in the Last Year: Not on file  . Ran Out of Food in the Last Year: Not on file  Transportation Needs:   . Lack of Transportation (Medical): Not on file  . Lack of Transportation (Non-Medical): Not on file  Physical Activity:   . Days of Exercise per Week: Not on file  . Minutes of Exercise per Session: Not on file  Stress:   . Feeling of Stress : Not on file  Social Connections:   . Frequency of Communication with Friends and Family: Not on file  . Frequency of Social Gatherings with Friends and Family: Not on file  . Attends Religious Services: Not on file  . Active Member of Clubs or Organizations: Not on file  . Attends BankerClub or Organization Meetings: Not on file  . Marital Status: Not on file   Past Surgical History:  Procedure Laterality Date  . ABDOMINAL SURGERY  ~ 2007   some sort of pancreatic cyst drainage.   . ABDOMINAL SURGERY    . ADENOIDECTOMY    . CHOLECYSTECTOMY     ~ age 66-laparoscopic  . CHOLECYSTECTOMY    . EUS N/A 04/14/2013   Procedure: UPPER ENDOSCOPIC ULTRASOUND (EUS) LINEAR;  Surgeon: Rachael Feeaniel P Jacobs, MD;  Location: [facility];  Service: Endoscopy;  Laterality: N/A;  . HERNIA REPAIR  2017  . ROUX-EN-Y GASTRIC BYPASS    . ROUX-EN-Y PROCEDURE     stent to pancreatic cyst that became infected within 36 hours   Past Medical History:  Diagnosis Date  . Abdominal hernia   . Adrenal insufficiency (HCC) 04/23/2013   ??  Marland Kitchen. Anxiety   . Depression    "recent breakup with partner of 15 yrs"  . Depression   . GERD (gastroesophageal reflux disease)   . Hernia 03-24-13   ventral hernia remains   . Lupus (HCC)   . MS (multiple sclerosis) (HCC)   . 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 Pancreatic cyst 2002 onset  . Pancreatitis 03-24-13   2002, 2'2013, 03-14-13  . PTSD (post-traumatic stress disorder)   . Ventral hernia    Ht 5\' 3"  (1.6 m)   Wt 242 lb (109.8 kg)   BMI 42.87 kg/m    Opioid Risk Score:   Fall Risk Score:  `1  Depression screen PHQ 2/9  [facility] 05/18/2019 06/29/2018 04/28/2018 12/28/2017 05/13/2017 04/09/2016 10/01/2015  Decreased Interest 1 1 1 1 3 2 2   Down, Depressed, Hopeless 1 1 1 1 3 2 2   PHQ - 2 Score  2 2 2 2 6 4 4   Altered sleeping - - - 1 - - -  Tired, decreased energy - - - 1 - - -  Change in appetite - - - 1 - - -  Feeling bad or failure about yourself  - - - 1 - - -  Trouble concentrating - - - 1 - - -  Moving slowly or fidgety/restless - - - 1 - - -  Suicidal thoughts - - - 0 - - -  PHQ-9 Score - - - 8 - - -  Difficult doing work/chores - - - Somewhat difficult - - -    Review of Systems  Musculoskeletal: Positive for back pain.  All other systems reviewed and are negative.      Objective:   Physical Exam Vitals and nursing note reviewed.  Musculoskeletal:     Comments: No Physical Exam Performed: Virtual Visit           Assessment & Plan:  1. MS: Neurology Following. 08/09/2019 2. 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 #120. 08/09/2019 3. Low back pain with Radiculopathy RLE/: Continued topamax.08/09/2019 4. Chronic pancreatitis: PCP and Gastroenterologist Following 5. Insomnia: Continue Pamelor. 08/09/2019 6. PTSD: Psychiatry Following. Dr. 08/11/2019 following.. 08/09/2019. 7. Lupus: Rheumatology Following. 08/09/2019 8.ChronicMigraine/ Vascular Headaches: Continue Imitrex,Topamaxand Aimovig.08/09/2019 9. Right Knee Pain:No complaints today.Continue HEP as Tolerated. Continue to Monitor.08/09/2019. 10. Muscle Spasm: Continue Baclofen/ Robaxin. Continue to Monitor.08/09/2019  Telephone Call  Location of patient: In her Home Location of provider: Office Established patient Time spent on call:10 Minutes   F/U in 2 months

## 2019-08-10 ENCOUNTER — Ambulatory Visit: Payer: Medicaid Other | Admitting: Registered Nurse

## 2019-08-12 ENCOUNTER — Encounter: Payer: Self-pay | Admitting: Registered Nurse

## 2019-08-16 ENCOUNTER — Other Ambulatory Visit: Payer: Self-pay | Admitting: Registered Nurse

## 2019-08-16 DIAGNOSIS — R531 Weakness: Secondary | ICD-10-CM

## 2019-08-16 DIAGNOSIS — M5416 Radiculopathy, lumbar region: Secondary | ICD-10-CM

## 2019-08-16 DIAGNOSIS — M329 Systemic lupus erythematosus, unspecified: Secondary | ICD-10-CM

## 2019-08-16 DIAGNOSIS — G43719 Chronic migraine without aura, intractable, without status migrainosus: Secondary | ICD-10-CM

## 2019-08-16 DIAGNOSIS — G894 Chronic pain syndrome: Secondary | ICD-10-CM

## 2019-08-25 ENCOUNTER — Telehealth: Payer: Self-pay | Admitting: *Deleted

## 2019-08-25 NOTE — Telephone Encounter (Signed)
Prior authorization submitted to [facility] for oxycodone tablets.  Approved from 08/22/2019-02/18/2020

## 2019-08-29 ENCOUNTER — Other Ambulatory Visit: Payer: Self-pay

## 2019-08-29 ENCOUNTER — Encounter: Payer: Medicaid Other | Attending: Physical Medicine & Rehabilitation | Admitting: Psychology

## 2019-08-29 ENCOUNTER — Encounter: Payer: Self-pay | Admitting: Psychology

## 2019-08-29 DIAGNOSIS — Z5181 Encounter for therapeutic drug level monitoring: Secondary | ICD-10-CM | POA: Diagnosis present

## 2019-08-29 DIAGNOSIS — Z79899 Other long term (current) drug therapy: Secondary | ICD-10-CM | POA: Diagnosis present

## 2019-08-29 DIAGNOSIS — G894 Chronic pain syndrome: Secondary | ICD-10-CM | POA: Insufficient documentation

## 2019-08-29 DIAGNOSIS — G43719 Chronic migraine without aura, intractable, without status migrainosus: Secondary | ICD-10-CM | POA: Insufficient documentation

## 2019-08-29 DIAGNOSIS — F331 Major depressive disorder, recurrent, moderate: Secondary | ICD-10-CM

## 2019-08-29 DIAGNOSIS — M545 Low back pain: Secondary | ICD-10-CM | POA: Insufficient documentation

## 2019-08-29 DIAGNOSIS — G35 Multiple sclerosis: Secondary | ICD-10-CM

## 2019-08-29 DIAGNOSIS — K861 Other chronic pancreatitis: Secondary | ICD-10-CM | POA: Diagnosis not present

## 2019-08-29 DIAGNOSIS — R531 Weakness: Secondary | ICD-10-CM | POA: Diagnosis present

## 2019-08-29 DIAGNOSIS — F4310 Post-traumatic stress disorder, unspecified: Secondary | ICD-10-CM

## 2019-08-29 DIAGNOSIS — M329 Systemic lupus erythematosus, unspecified: Secondary | ICD-10-CM | POA: Diagnosis present
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
## 2019-09-13 ENCOUNTER — Ambulatory Visit: Payer: Medicaid Other | Admitting: Internal Medicine

## 2019-09-20 ENCOUNTER — Other Ambulatory Visit: Payer: Self-pay | Admitting: Physical Medicine & Rehabilitation

## 2019-09-20 DIAGNOSIS — R531 Weakness: Secondary | ICD-10-CM

## 2019-09-20 DIAGNOSIS — G35 Multiple sclerosis: Secondary | ICD-10-CM

## 2019-09-20 DIAGNOSIS — M329 Systemic lupus erythematosus, unspecified: Secondary | ICD-10-CM

## 2019-09-20 NOTE — Telephone Encounter (Signed)
Patient is asking to take two Methocarbamol a day #60 instead of one daily. Is this okay?

## 2019-09-23 ENCOUNTER — Telehealth: Payer: Self-pay | Admitting: Registered Nurse

## 2019-09-23 DIAGNOSIS — R531 Weakness: Secondary | ICD-10-CM

## 2019-09-23 DIAGNOSIS — M329 Systemic lupus erythematosus, unspecified: Secondary | ICD-10-CM

## 2019-09-23 DIAGNOSIS — G35 Multiple sclerosis: Secondary | ICD-10-CM

## 2019-09-23 MED ORDER — METHOCARBAMOL 500 MG PO TABS: 500.0000 mg | ORAL_TABLET | Freq: Every day | ORAL | 1 refills | Status: DC | PRN

## 2019-09-23 NOTE — Telephone Encounter (Signed)
Provider placed a call to Ms. Jeremie, she reports she only takes her Methocarbamol daily as needed. New prescription sent to pharmacy.

## 2019-09-26 ENCOUNTER — Other Ambulatory Visit: Payer: Self-pay

## 2019-09-26 ENCOUNTER — Encounter: Payer: Medicaid Other | Attending: Physical Medicine & Rehabilitation | Admitting: Psychology

## 2019-09-26 DIAGNOSIS — G35 Multiple sclerosis: Secondary | ICD-10-CM | POA: Insufficient documentation

## 2019-09-26 DIAGNOSIS — Z5181 Encounter for therapeutic drug level monitoring: Secondary | ICD-10-CM | POA: Insufficient documentation

## 2019-09-26 DIAGNOSIS — M329 Systemic lupus erythematosus, unspecified: Secondary | ICD-10-CM | POA: Insufficient documentation

## 2019-09-26 DIAGNOSIS — R531 Weakness: Secondary | ICD-10-CM | POA: Insufficient documentation

## 2019-09-26 DIAGNOSIS — F331 Major depressive disorder, recurrent, moderate: Secondary | ICD-10-CM

## 2019-09-26 DIAGNOSIS — G894 Chronic pain syndrome: Secondary | ICD-10-CM | POA: Insufficient documentation

## 2019-09-26 DIAGNOSIS — F4310 Post-traumatic stress disorder, unspecified: Secondary | ICD-10-CM

## 2019-09-26 DIAGNOSIS — G43719 Chronic migraine without aura, intractable, without status migrainosus: Secondary | ICD-10-CM | POA: Insufficient documentation

## 2019-09-26 DIAGNOSIS — M545 Low back pain: Secondary | ICD-10-CM | POA: Insufficient documentation

## 2019-09-26 DIAGNOSIS — K861 Other chronic pancreatitis: Secondary | ICD-10-CM | POA: Insufficient documentation

## 2019-09-26 DIAGNOSIS — Z79899 Other long term (current) drug therapy: Secondary | ICD-10-CM | POA: Insufficient documentation
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
## 2019-10-11 ENCOUNTER — Other Ambulatory Visit: Payer: Self-pay | Admitting: Registered Nurse

## 2019-10-11 ENCOUNTER — Encounter: Payer: Medicaid Other | Admitting: Registered Nurse

## 2019-10-11 DIAGNOSIS — G894 Chronic pain syndrome: Secondary | ICD-10-CM

## 2019-10-13 ENCOUNTER — Encounter: Payer: Self-pay | Admitting: Internal Medicine

## 2019-10-13 ENCOUNTER — Ambulatory Visit (INDEPENDENT_AMBULATORY_CARE_PROVIDER_SITE_OTHER): Payer: Medicaid Other | Admitting: Internal Medicine

## 2019-10-13 VITALS — BP 100/60 | HR 88 | Temp 97.9°F | Ht 63.0 in | Wt 247.0 lb

## 2019-10-13 DIAGNOSIS — R110 Nausea: Secondary | ICD-10-CM | POA: Diagnosis not present

## 2019-10-13 DIAGNOSIS — G8929 Other chronic pain: Secondary | ICD-10-CM

## 2019-10-13 DIAGNOSIS — K860 Alcohol-induced chronic pancreatitis: Secondary | ICD-10-CM

## 2019-10-13 DIAGNOSIS — R1013 Epigastric pain: Secondary | ICD-10-CM

## 2019-10-13 DIAGNOSIS — K219 Gastro-esophageal reflux disease without esophagitis: Secondary | ICD-10-CM

## 2019-10-13 MED ORDER — PANTOPRAZOLE SODIUM 40 MG PO TBEC: 40.0000 mg | DELAYED_RELEASE_TABLET | Freq: Two times a day (BID) | ORAL | 3 refills | Status: DC

## 2019-10-13 MED ORDER — PROMETHAZINE HCL 25 MG RE SUPP: 25.0000 mg | Freq: Four times a day (QID) | RECTAL | 6 refills | Status: AC | PRN

## 2019-10-13 MED ORDER — PROMETHAZINE HCL 25 MG PO TABS: ORAL_TABLET | ORAL | 11 refills | Status: DC

## 2019-10-13 NOTE — Patient Instructions (Signed)
We have sent the following medications to your pharmacy for you to pick up at your convenience:  Pantoprazole, Phenergan suppositories and tablets.  Please follow up in 2 years

## 2019-10-13 NOTE — Progress Notes (Signed)
HISTORY OF PRESENT ILLNESS:  Mary [name] is a 44 y.o. adult with multiple significant medical problems including morbid obesity, reported lupus, history of MS, GERD, chronic pancreatitis, chronic pain syndrome on narcotics, chronic functional abdominal complaints, and significant anxiety/depression.  Patient was last seen in this office October 18, 2016.  See that dictation for details.  At that time she was being evaluated principally for chronic nausea, a history of pancreatitis, and GERD.  She was continued on PPI, prescribed Phenergan, and asked to follow-up in 1 year (or sooner if needed).  She follows up at this time.  She is accompanied by her partner.  She tells me that she continues with chronic intermittent abdominal pain though is able to avoid hospitalization by taking her narcotics and antiemetics.  Since her last visit she did undergo CT scan of the abdomen and pelvis Jan 15, 2018.  No acute abdominal process.  Pancreatic atrophy noted and regression of a small tail cyst mention.  Also hepatic steatosis.  Review of outside blood work from June 12, 2019 shows normal creatinine.  CBC with hemoglobin 11.6.  Her last upper endoscopy in February 2016 revealed esophagitis.  She requests refill of her medications.  She does continue on Creon which she is prescribed by her primary provider.  For her GERD she takes pantoprazole twice daily.  Despite this she will have some breakthrough symptoms at night.  She does have an acids at the bedside.  For nausea she has used both oral and suppository forms of Phenergan.  GI review of systems is also remarkable for bloating and occasional diarrhea.  She has had no weight loss since her last visit.  She has not received the Covid vaccination, but hopes to.  REVIEW OF SYSTEMS:  All non-GI ROS negative unless otherwise stated in the HPI except for anxiety, confusion, back pain, depression, fatigue, headaches, hearing problems  Past Medical History:  Diagnosis  Date  . Abdominal hernia   . Adrenal insufficiency ([place]) 04/23/2013   ??  Marland Kitchen Anxiety   . Depression    "recent breakup with partner of 15 yrs"  . Depression   . GERD (gastroesophageal reflux disease)   . Hernia 03-24-13   ventral hernia remains   . Lupus ([place])   . MS (multiple sclerosis) ([place])   . 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 Pancreatic cyst 2002 onset  . Pancreatitis 03-24-13   2002, 2'2013, 03-14-13  . PTSD (post-traumatic stress disorder)   . Ventral hernia     Past Surgical History:  Procedure Laterality Date  . ABDOMINAL SURGERY  ~ 2007   some sort of pancreatic cyst drainage.   . ABDOMINAL SURGERY    . ADENOIDECTOMY    . CHOLECYSTECTOMY     ~ age 29-laparoscopic  . CHOLECYSTECTOMY    . EUS N/A 04/14/2013   Procedure: UPPER ENDOSCOPIC ULTRASOUND (EUS) LINEAR;  Surgeon: Milus Banister, MD;  Location: [facility] END[facility];  Service: Endoscopy;  Laterality: N/A;  . HERNIA REPAIR  2017  . ROUX-EN-Y GASTRIC BYPASS    . ROUX-EN-Y PROCEDURE     stent to pancreatic cyst that became infected within 36 hours    Social History Halli Equihua Wigle  reports that he quit smoking about 6 years ago. His smoking use included cigarettes. He has a 10.00 pack-year smoking history. He has never used smokeless tobacco. He reports that he does not drink alcohol or use drugs.  family history includes CVA in his  maternal grandmother; Cervical cancer in his mother; Colitis in his maternal aunt; Diabetes in his father, mother, and another family member; Heart disease in his father and mother; Hypertension in his father and mother.  Allergies  Allergen Reactions  . Amoxicillin Anaphylaxis  . Buprenorphine Hcl Anaphylaxis and Nausea And Vomiting  . Latex Rash  . Morphine And Related Anaphylaxis and Nausea And Vomiting  . Penicillins Anaphylaxis and Rash    Did it involve swelling of the face/tongue/throat, SOB, or low BP? Unknown Did it involve sudden or severe rash/hives,  skin peeling, or any reaction on the inside of your mouth or nose? Yes Did you need to seek medical attention at a hospital or doctor's office? Unknown When did it last happen?unknown - prior to 2013 If all above answers are "NO", may proceed with cephalosporin use.    . Meat Extract Other (See Comments)    Does not eat meat--only fish (due to pancreatitis)  . Other Other (See Comments)    Does not eat meat--only fish (due to pancreatitis)       PHYSICAL EXAMINATION: Vital signs: BP 100/60 (BP Location: Left Arm, Patient Position: Sitting, Cuff Size: Normal)   Pulse 88   Temp 97.9 F (36.6 C)   Ht 5\' 3"  (1.6 m) Comment: height measured without shoes  Wt 247 lb (112 kg)   BMI 43.75 kg/m   Constitutional: Obese but generally well-appearing, no acute distress Psychiatric: alert and oriented x3, cooperative Eyes: extraocular movements intact, anicteric, conjunctiva pink Mouth: Mask Neck: supple no lymphadenopathy Cardiovascular: heart regular rate and rhythm, no murmur Lungs: clear to auscultation bilaterally Abdomen: soft, obese with mild epigastric tenderness, nondistended, no obvious ascites, no peritoneal signs, normal bowel sounds, no organomegaly Rectal: Omitted Extremities: no clubbing, cyanosis, or lower extremity edema bilaterally Skin: no lesions on visible extremities Neuro: No focal deficits.  Cranial nerves intact  ASSESSMENT:  1.  Chronic GERD.  Breakthrough symptoms despite twice daily therapy 2.  Morbid obesity.  Contributing to GERD 3.  History of chronic pancreatitis 4.  Chronic pain syndrome.  On narcotics 5.  Intermittent problems with nausea.  Phenergan is helpful for her to avoid intractable vomiting and hospitalization   PLAN:  1.  Reflux precautions 2.  Weight loss 3.  Continue pantoprazole.  Prescription refilled.  Medication risks reviewed 4.  Prescribe Phenergan tablets and suppositories.  Medication risks reviewed 5.  Routine GI  follow-up 2 years.  Contact the office in the interim for questions or problems A total time of 30 minutes was spent preparing to see the patient, reviewing outside tests and x-rays, obtaining history, performing comprehensive physical exam, counseling the patient regarding the above listed issues, ordering medications, and documenting clinical information in the health record

## 2019-10-17 ENCOUNTER — Encounter: Payer: Self-pay | Admitting: Registered Nurse

## 2019-10-17 ENCOUNTER — Other Ambulatory Visit: Payer: Self-pay

## 2019-10-17 ENCOUNTER — Encounter: Payer: Medicaid Other | Attending: Physical Medicine & Rehabilitation | Admitting: Registered Nurse

## 2019-10-17 VITALS — BP 115/80 | HR 80 | Ht 63.0 in | Wt 240.0 lb

## 2019-10-17 DIAGNOSIS — G35 Multiple sclerosis: Secondary | ICD-10-CM

## 2019-10-17 DIAGNOSIS — F331 Major depressive disorder, recurrent, moderate: Secondary | ICD-10-CM

## 2019-10-17 DIAGNOSIS — M329 Systemic lupus erythematosus, unspecified: Secondary | ICD-10-CM | POA: Diagnosis present

## 2019-10-17 DIAGNOSIS — G894 Chronic pain syndrome: Secondary | ICD-10-CM

## 2019-10-17 DIAGNOSIS — M47816 Spondylosis without myelopathy or radiculopathy, lumbar region: Secondary | ICD-10-CM

## 2019-10-17 DIAGNOSIS — R531 Weakness: Secondary | ICD-10-CM | POA: Diagnosis present

## 2019-10-17 DIAGNOSIS — K861 Other chronic pancreatitis: Secondary | ICD-10-CM | POA: Diagnosis not present

## 2019-10-17 DIAGNOSIS — Z5181 Encounter for therapeutic drug level monitoring: Secondary | ICD-10-CM | POA: Diagnosis present

## 2019-10-17 DIAGNOSIS — G43719 Chronic migraine without aura, intractable, without status migrainosus: Secondary | ICD-10-CM | POA: Diagnosis present

## 2019-10-17 DIAGNOSIS — Z79899 Other long term (current) drug therapy: Secondary | ICD-10-CM

## 2019-10-17 DIAGNOSIS — F5101 Primary insomnia: Secondary | ICD-10-CM

## 2019-10-17 DIAGNOSIS — M2550 Pain in unspecified joint: Secondary | ICD-10-CM

## 2019-10-17 DIAGNOSIS — F4310 Post-traumatic stress disorder, unspecified: Secondary | ICD-10-CM

## 2019-10-17 DIAGNOSIS — M5416 Radiculopathy, lumbar region: Secondary | ICD-10-CM | POA: Diagnosis present

## 2019-10-17 DIAGNOSIS — M545 Low back pain: Secondary | ICD-10-CM | POA: Insufficient documentation

## 2019-10-17 DIAGNOSIS — M62838 Other muscle spasm: Secondary | ICD-10-CM

## 2019-10-17 MED ORDER — OXYCODONE HCL 10 MG PO TABS: 10.0000 mg | ORAL_TABLET | Freq: Four times a day (QID) | ORAL | 0 refills | Status: DC | PRN

## 2019-10-17 MED ORDER — TOPIRAMATE 100 MG PO TABS: ORAL_TABLET | ORAL | 2 refills | Status: DC

## 2019-10-17 MED ORDER — METHOCARBAMOL 500 MG PO TABS: 500.0000 mg | ORAL_TABLET | Freq: Every day | ORAL | 2 refills | Status: DC | PRN

## 2019-10-17 MED ORDER — BACLOFEN 10 MG PO TABS: ORAL_TABLET | ORAL | 2 refills | Status: DC

## 2019-10-17 MED ORDER — SUMATRIPTAN SUCCINATE 100 MG PO TABS: ORAL_TABLET | ORAL | 2 refills | Status: DC

## 2019-10-17 NOTE — Progress Notes (Signed)
Subjective:    Patient ID: Mary [name], adult    DOB: 08/14/1976, 44 y.o.   MRN: [id number]  HPI: Mary [name] is a 44 y.o. female who returns for follow up appointment for chronic pain and medication refill. She states her pain is located in her lower back and reports generalized joint pain all over. Also reports she has a headache today. She rates her pain 6. Her  current exercise regime is walking also encouraged to increase HEP as tolerated, she verbalizes understanding.   [name] Morphine equivalent is  60.00 MME.  UDS ordered today.   [name] had a pseudoseizure at the end of her visit, her mother States Rheumatology and Dr. Sima Matas following. The pseudo seizure lasted a few seconds and [name] responsive and talking with her mother. We will Continue to monitor.    Pain Inventory Average Pain 6 Pain Right Now 6 My pain is sharp, dull, stabbing, tingling and aching  In the last 24 hours, has pain interfered with the following? General activity 7 Relation with others 5 Enjoyment of life 5 What TIME of day is your pain at its worst? morning, evening Sleep (in general) Fair  Pain is worse with: bending, standing and some activites Pain improves with: rest and medication Relief from Meds: 7  Mobility walk without assistance how many minutes can you walk? 10 ability to climb steps?  yes do you drive?  no transfers alone Do you have any goals in this area?  no  Function disabled: date disabled . Do you have any goals in this area?  no  Neuro/Psych bladder control problems  Prior Studies Any changes since last visit?  no  Physicians involved in your care Any changes since last visit?  no   Family History  Problem Relation Age of Onset  . Diabetes Mother   . Hypertension Mother   . Cervical cancer Mother   . Heart disease Mother   . Diabetes Father   . Hypertension Father   . Heart disease Father   . Colitis Maternal Aunt   . Diabetes Other         many family members  . CVA Maternal Grandmother   . Lupus Neg Hx   . Sarcoidosis Neg Hx   . Pancreatitis Neg Hx   . Ataxia Neg Hx   . Chorea Neg Hx   . Dementia Neg Hx   . Mental retardation Neg Hx   . Migraines Neg Hx   . Multiple sclerosis Neg Hx   . Neurofibromatosis Neg Hx   . Neuropathy Neg Hx   . Parkinsonism Neg Hx   . Seizures Neg Hx   . Stroke Neg Hx   . Colon cancer Neg Hx    Social History   Socioeconomic History  . Marital status: Single    Spouse name: Not on file  . Number of children: 0  . Years of education: Not on file  . Highest education level: Not on file  Occupational History  . Not on file  Tobacco Use  . Smoking status: Former Smoker    Packs/day: 0.50    Years: 20.00    Pack years: 10.00    Types: Cigarettes    Quit date: 05/22/2013    Years since quitting: 6.4  . Smokeless tobacco: Never Used  Substance and Sexual Activity  . Alcohol use: No    Alcohol/week: 0.0 standard drinks  . Drug use: No  . Sexual activity: Never  Birth control/protection: Abstinence  Other Topics Concern  . Not on file  Social History Narrative   ** Merged History Encounter **       Lives permanently in [place] with mom and stepfather.  Has not started working yet and was unemployed in [place].         Social Determinants of Health   Financial Resource Strain:   . Difficulty of Paying Living Expenses: Not on file  Food Insecurity:   . Worried About Programme researcher, broadcasting/film/video in the Last Year: Not on file  . Ran Out of Food in the Last Year: Not on file  Transportation Needs:   . Lack of Transportation (Medical): Not on file  . Lack of Transportation (Non-Medical): Not on file  Physical Activity:   . Days of Exercise per Week: Not on file  . Minutes of Exercise per Session: Not on file  Stress:   . Feeling of Stress : Not on file  Social Connections:   . Frequency of Communication with Friends and Family: Not on file  . Frequency of Social Gatherings  with Friends and Family: Not on file  . Attends Religious Services: Not on file  . Active Member of Clubs or Organizations: Not on file  . Attends Banker Meetings: Not on file  . Marital Status: Not on file   Past Surgical History:  Procedure Laterality Date  . ABDOMINAL SURGERY  ~ 2007   some sort of pancreatic cyst drainage.   . ABDOMINAL SURGERY    . ADENOIDECTOMY    . CHOLECYSTECTOMY     ~ age 89-laparoscopic  . CHOLECYSTECTOMY    . EUS N/A 04/14/2013   Procedure: UPPER ENDOSCOPIC ULTRASOUND (EUS) LINEAR;  Surgeon: Rachael Fee, MD;  Location: [facility];  Service: Endoscopy;  Laterality: N/A;  . HERNIA REPAIR  2017  . ROUX-EN-Y GASTRIC BYPASS    . ROUX-EN-Y PROCEDURE     stent to pancreatic cyst that became infected within 36 hours   Past Medical History:  Diagnosis Date  . Abdominal hernia   . Adrenal insufficiency (HCC) 04/23/2013   ??  Marland Kitchen Anxiety   . Depression    "recent breakup with partner of 15 yrs"  . Depression   . GERD (gastroesophageal reflux disease)   . Hernia 03-24-13   ventral hernia remains   . Lupus (HCC)   . MS (multiple sclerosis) (HCC)   . 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 Pancreatic cyst 2002 onset  . Pancreatitis 03-24-13   2002, 2'2013, 03-14-13  . PTSD (post-traumatic stress disorder)   . Ventral hernia    Ht 5\' 3"  (1.6 m)   Wt 240 lb (108.9 kg)   BMI 42.51 kg/m   Opioid Risk Score:   Fall Risk Score:  `1  Depression screen PHQ 2/9  Depression screen [facility] 2/9 05/18/2019 06/29/2018 04/28/2018 12/28/2017 05/13/2017 04/09/2016 10/01/2015  Decreased Interest 1 1 1 1 3 2 2   Down, Depressed, Hopeless 1 1 1 1 3 2 2   PHQ - 2 Score 2 2 2 2 6 4 4   Altered sleeping - - - 1 - - -  Tired, decreased energy - - - 1 - - -  Change in appetite - - - 1 - - -  Feeling bad or failure about yourself  - - - 1 - - -  Trouble concentrating - - - 1 - - -  Moving slowly or fidgety/restless - - - 1 - - -  Suicidal  thoughts - - - 0 - - -  PHQ-9 Score - - - 8 - - -  Difficult doing work/chores - - - Somewhat difficult - - -    Review of Systems  Constitutional: Negative.   HENT: Negative.   Eyes: Positive for photophobia and visual disturbance.  Respiratory: Negative.   Cardiovascular: Negative.   Gastrointestinal: Negative.   Endocrine: Negative.   Genitourinary: Positive for difficulty urinating.  Musculoskeletal: Positive for arthralgias and gait problem.  Skin: Negative.   Neurological: Positive for dizziness, light-headedness and headaches.  Hematological: Negative.   All other systems reviewed and are negative.      Objective:   Physical Exam Vitals and nursing note reviewed.  Constitutional:      Appearance: Normal appearance. He is obese.  Cardiovascular:     Rate and Rhythm: Normal rate and regular rhythm.     Pulses: Normal pulses.     Heart sounds: Normal heart sounds.  Pulmonary:     Effort: Pulmonary effort is normal.     Breath sounds: Normal breath sounds.  Musculoskeletal:     Cervical back: Normal range of motion and neck supple.     Comments: Normal Muscle Bulk and Muscle Testing Reveals:  Upper Extremities: Full ROM and Muscle Strength on the Right 4/5 and Left 5/5 Lumbar Paraspinal Tenderness: L-4-L-5 Lower Extremities: Full ROM and Muscle Strength 5/5 Arises from Table with ease Narrow Based  Gait   Skin:    General: Skin is warm and dry.  Neurological:     Mental Status: He is alert and oriented to person, place, and time.  Psychiatric:        Mood and Affect: Mood normal.        Behavior: Behavior normal.           Assessment & Plan:  1. MS: Neurology Following. 10/17/2019 2. 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 #120. Second Script sent for the following month. 10/17/2019 3. Low back pain with Radiculopathy RLE/:  Continued topamax.10/17/2019 4. Chronic pancreatitis: PCP and Gastroenterologist Following 5. Insomnia: Continue Pamelor. 10/17/2019 6. PTSD: Psychiatry Following. Dr. Kieth Brightly following.Marland Kitchen 10/17/2019. 7. Lupus: Rheumatology Following. 10/17/2019 8.ChronicMigraine/ Vascular Headaches: Continue Imitrex,Topamaxand Aimovig.10/17/2018 9. Right Knee Pain:No complaints today.Continue HEP as Tolerated. Continue to Monitor.10/17/2019. 10. Muscle Spasm: Continue Baclofen/ Robaxin. Continue to Monitor.10/17/2019  20 minutes of face to face patient care time was spent during this visit. All questions were encouraged and answered.

## 2019-10-26 LAB — TOXASSURE SELECT,+ANTIDEPR,UR

## 2019-10-31 ENCOUNTER — Telehealth: Payer: Self-pay

## 2019-10-31 NOTE — Telephone Encounter (Signed)
UDS RESULTS CONSISTENT WITH MEDICATION LIST 

## 2019-12-01 ENCOUNTER — Encounter: Payer: Medicaid Other | Attending: Physical Medicine & Rehabilitation | Admitting: Psychology

## 2019-12-01 ENCOUNTER — Other Ambulatory Visit: Payer: Self-pay

## 2019-12-01 ENCOUNTER — Encounter: Payer: Self-pay | Admitting: Psychology

## 2019-12-01 DIAGNOSIS — R531 Weakness: Secondary | ICD-10-CM | POA: Diagnosis not present

## 2019-12-01 DIAGNOSIS — G894 Chronic pain syndrome: Secondary | ICD-10-CM | POA: Diagnosis not present

## 2019-12-01 DIAGNOSIS — G43719 Chronic migraine without aura, intractable, without status migrainosus: Secondary | ICD-10-CM

## 2019-12-01 DIAGNOSIS — Z5181 Encounter for therapeutic drug level monitoring: Secondary | ICD-10-CM | POA: Insufficient documentation

## 2019-12-01 DIAGNOSIS — F331 Major depressive disorder, recurrent, moderate: Secondary | ICD-10-CM | POA: Diagnosis not present

## 2019-12-01 DIAGNOSIS — M545 Low back pain: Secondary | ICD-10-CM | POA: Insufficient documentation

## 2019-12-01 DIAGNOSIS — K861 Other chronic pancreatitis: Secondary | ICD-10-CM | POA: Insufficient documentation

## 2019-12-01 DIAGNOSIS — F4310 Post-traumatic stress disorder, unspecified: Secondary | ICD-10-CM

## 2019-12-01 DIAGNOSIS — G35D Multiple sclerosis, unspecified: Secondary | ICD-10-CM

## 2019-12-01 DIAGNOSIS — Z79899 Other long term (current) drug therapy: Secondary | ICD-10-CM | POA: Insufficient documentation

## 2019-12-01 DIAGNOSIS — M5416 Radiculopathy, lumbar region: Secondary | ICD-10-CM | POA: Insufficient documentation

## 2019-12-01 DIAGNOSIS — G35 Multiple sclerosis: Secondary | ICD-10-CM

## 2019-12-01 DIAGNOSIS — M329 Systemic lupus erythematosus, unspecified: Secondary | ICD-10-CM | POA: Insufficient documentation
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
## 2019-12-11 ENCOUNTER — Other Ambulatory Visit: Payer: Self-pay | Admitting: Registered Nurse

## 2019-12-11 DIAGNOSIS — G894 Chronic pain syndrome: Secondary | ICD-10-CM

## 2019-12-21 ENCOUNTER — Encounter: Payer: Self-pay | Admitting: Physical Medicine & Rehabilitation

## 2019-12-21 ENCOUNTER — Encounter: Payer: Medicaid Other | Attending: Physical Medicine & Rehabilitation | Admitting: Physical Medicine & Rehabilitation

## 2019-12-21 ENCOUNTER — Other Ambulatory Visit: Payer: Self-pay

## 2019-12-21 VITALS — BP 104/74 | HR 77 | Temp 97.3°F | Ht 63.0 in | Wt 247.0 lb

## 2019-12-21 DIAGNOSIS — M329 Systemic lupus erythematosus, unspecified: Secondary | ICD-10-CM

## 2019-12-21 DIAGNOSIS — Z5181 Encounter for therapeutic drug level monitoring: Secondary | ICD-10-CM

## 2019-12-21 DIAGNOSIS — M5416 Radiculopathy, lumbar region: Secondary | ICD-10-CM | POA: Insufficient documentation

## 2019-12-21 DIAGNOSIS — G35 Multiple sclerosis: Secondary | ICD-10-CM | POA: Diagnosis present

## 2019-12-21 DIAGNOSIS — G894 Chronic pain syndrome: Secondary | ICD-10-CM

## 2019-12-21 DIAGNOSIS — M545 Low back pain: Secondary | ICD-10-CM | POA: Diagnosis not present

## 2019-12-21 DIAGNOSIS — G43719 Chronic migraine without aura, intractable, without status migrainosus: Secondary | ICD-10-CM

## 2019-12-21 DIAGNOSIS — K861 Other chronic pancreatitis: Secondary | ICD-10-CM | POA: Diagnosis not present

## 2019-12-21 DIAGNOSIS — R531 Weakness: Secondary | ICD-10-CM | POA: Diagnosis not present

## 2019-12-21 DIAGNOSIS — Z79899 Other long term (current) drug therapy: Secondary | ICD-10-CM | POA: Diagnosis present

## 2019-12-21 MED ORDER — NORTRIPTYLINE HCL 50 MG PO CAPS: 50.0000 mg | ORAL_CAPSULE | Freq: Every day | ORAL | 3 refills | Status: DC

## 2019-12-21 MED ORDER — OXYCODONE HCL 10 MG PO TABS: 10.0000 mg | ORAL_TABLET | Freq: Four times a day (QID) | ORAL | 0 refills | Status: DC | PRN

## 2019-12-21 NOTE — Patient Instructions (Signed)
PLEASE FEEL FREE TO CALL OUR OFFICE WITH ANY PROBLEMS OR QUESTIONS ([phone])      

## 2019-12-21 NOTE — Progress Notes (Signed)
Subjective:    Patient ID: Mary [name], adult    DOB: [birth year], 44 y.o.   MRN: [id number]  HPI   This is a follow-up office visit for Mary [name].  I last met with her in September via phone call.  She has been following up with our nurse practitioner in the meantime.  She has had good and bad days regarding her general pain. Her migraines have worsened over the last month. The aimovig increase had helped her migraines for the most part prior.   She has had some dissociative spells but they've not been as intense.     Pain Inventory Average Pain 8 Pain Right Now 6 My pain is constant, sharp, burning, dull, stabbing, tingling and aching  In the last 24 hours, has pain interfered with the following? General activity 8 Relation with others 7 Enjoyment of life 7 What TIME of day is your pain at its worst? morning, evening Sleep (in general) Poor  Pain is worse with: bending, inactivity and standing Pain improves with: rest and medication Relief from Meds: 7  Mobility walk without assistance ability to climb steps?  yes do you drive?  no  Function disabled: date disabled .  Neuro/Psych bladder control problems weakness numbness tremor tingling trouble walking spasms dizziness confusion depression anxiety  Prior Studies Any changes since last visit?  no  Physicians involved in your care Any changes since last visit?  no   Family History  Problem Relation Age of Onset  . Diabetes Mother   . Hypertension Mother   . Cervical cancer Mother   . Heart disease Mother   . Diabetes Father   . Hypertension Father   . Heart disease Father   . Colitis Maternal Aunt   . Diabetes Other        many family members  . CVA Maternal Grandmother   . Lupus Neg Hx   . Sarcoidosis Neg Hx   . Pancreatitis Neg Hx   . Ataxia Neg Hx   . Chorea Neg Hx   . Dementia Neg Hx   . Mental retardation Neg Hx   . Migraines Neg Hx   . Multiple sclerosis Neg Hx   .  Neurofibromatosis Neg Hx   . Neuropathy Neg Hx   . Parkinsonism Neg Hx   . Seizures Neg Hx   . Stroke Neg Hx   . Colon cancer Neg Hx    Social History   Socioeconomic History  . Marital status: Single    Spouse name: Not on file  . Number of children: 0  . Years of education: Not on file  . Highest education level: Not on file  Occupational History  . Not on file  Tobacco Use  . Smoking status: Former Smoker    Packs/day: 0.50    Years: 20.00    Pack years: 10.00    Types: Cigarettes    Quit date: 05/22/2013    Years since quitting: 6.5  . Smokeless tobacco: Never Used  Substance and Sexual Activity  . Alcohol use: No    Alcohol/week: 0.0 standard drinks  . Drug use: No  . Sexual activity: Never    Birth control/protection: Abstinence  Other Topics Concern  . Not on file  Social History Narrative   ** Merged History Encounter **       Lives permanently in [place] with mom and stepfather.  Has not started working yet and was unemployed in [place].  Social Determinants of Health   Financial Resource Strain:   . Difficulty of Paying Living Expenses:   Food Insecurity:   . Worried About Charity fundraiser in the Last Year:   . Arboriculturist in the Last Year:   Transportation Needs:   . Film/video editor (Medical):   Marland Kitchen Lack of Transportation (Non-Medical):   Physical Activity:   . Days of Exercise per Week:   . Minutes of Exercise per Session:   Stress:   . Feeling of Stress :   Social Connections:   . Frequency of Communication with Friends and Family:   . Frequency of Social Gatherings with Friends and Family:   . Attends Religious Services:   . Active Member of Clubs or Organizations:   . Attends Archivist Meetings:   Marland Kitchen Marital Status:    Past Surgical History:  Procedure Laterality Date  . ABDOMINAL SURGERY  ~ 2007   some sort of pancreatic cyst drainage.   . ABDOMINAL SURGERY    . ADENOIDECTOMY    . CHOLECYSTECTOMY     ~ age  74-laparoscopic  . CHOLECYSTECTOMY    . EUS N/A 04/14/2013   Procedure: UPPER ENDOSCOPIC ULTRASOUND (EUS) LINEAR;  Surgeon: Milus Banister, MD;  Location: [facility];  Service: Endoscopy;  Laterality: N/A;  . HERNIA REPAIR  2017  . ROUX-EN-Y GASTRIC BYPASS    . ROUX-EN-Y PROCEDURE     stent to pancreatic cyst that became infected within 36 hours   Past Medical History:  Diagnosis Date  . Abdominal hernia   . Adrenal insufficiency ([place]) 04/23/2013   ??  Marland Kitchen Anxiety   . Depression    "recent breakup with partner of 15 yrs"  . Depression   . GERD (gastroesophageal reflux disease)   . Hernia 03-24-13   ventral hernia remains   . Lupus ([place])   . MS (multiple sclerosis) ([place])   . 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 Pancreatic cyst 2002 onset  . Pancreatitis 03-24-13   2002, 2'2013, 03-14-13  . PTSD (post-traumatic stress disorder)   . Ventral hernia    BP 104/74   Pulse 77   Temp (!) 97.3 F (36.3 C)   Ht 5' 3"  (1.6 m)   Wt 247 lb (112 kg)   SpO2 97%   BMI 43.75 kg/m   Opioid Risk Score:   Fall Risk Score:  `1  Depression screen PHQ 2/9  Depression screen [facility] 2/9 05/18/2019 06/29/2018 04/28/2018 12/28/2017 05/13/2017 04/09/2016 10/01/2015  Decreased Interest 1 1 1 1 3 2 2   Down, Depressed, Hopeless 1 1 1 1 3 2 2   PHQ - 2 Score 2 2 2 2 6 4 4   Altered sleeping - - - 1 - - -  Tired, decreased energy - - - 1 - - -  Change in appetite - - - 1 - - -  Feeling bad or failure about yourself  - - - 1 - - -  Trouble concentrating - - - 1 - - -  Moving slowly or fidgety/restless - - - 1 - - -  Suicidal thoughts - - - 0 - - -  PHQ-9 Score - - - 8 - - -  Difficult doing work/chores - - - Somewhat difficult - - -    Review of Systems  Constitutional: Negative.   Eyes: Negative.   Respiratory: Negative.   Cardiovascular: Positive for chest pain.  Endocrine: Negative.  Genitourinary: Negative.   Musculoskeletal: Positive for arthralgias, gait problem,  neck pain and neck stiffness.  Allergic/Immunologic: Negative.   Neurological: Positive for dizziness, tremors, weakness, numbness and headaches.       Tingling   Psychiatric/Behavioral: Positive for confusion and dysphoric mood. The patient is nervous/anxious.   All other systems reviewed and are negative.      Objective:   Physical Exam Gen: no distress, normal appearing, obese HEENT: oral mucosa pink and moist, NCAT Cardio: Reg rate Chest: normal effort, normal rate of breathing Abd: soft, non-distended Ext: no edema Skin: intact, tattoos Neuro: moves all 4. Good balance, sl wide based gait Musculoskeletal: slight pain in upper cervical paraspinals and occiput. Fair neck rom.  Psych: pleasant, normal affect. A little flat        Assessment & Plan:   Assessment & Plan:   1. Non-specific bicerebral/brainstem white matter disease---initially suspected to be MS, but not characteristic in presentation. LikelySLE.  -continued memory and balance deficits.  2. Chronic pain syndrome related to above with primarily neuropathic right sided pain. She has had a recent increase in cervical pain and related headaches over the last several months as well.  3. Low back painwith symptoms more in the left low back and buttock. 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 Chronic pancreatitis  5. Conversion disorder/PTSD---at the core of many of her problems it appears 6. Chronic migraines/vascular headaches: recently increased. Unclear as to why   Plan:  1.Continuetopamax 130m am and 1098mqpm for headaches and neuropathic pain.  -maintainimitrex to 10061mor breakthrough migraine -continue aimovig at 140m84mr month  -increase pamelor to 50mg81m  -consider botox  -?nurtec 2. Continue psychiatry follow up. Many of her neurological symptoms are likely non-organic in nature given her  recent spontaneous improvement and relapse. Pt/family aware -  pamelor 25mg-4mrease to 50mg q44mmaintin follow up with Dr. RodenboSima Matasxycodone was refilled. #120 10mg wa24mfilled today. We will continue the controlled substance monitoring program, this consists of regular clinic visits, examinations, routine drug screening, pill counts as well as use of [place]. [place] today.   -Medication was refilled and a second prescription was sent to the patient's pharmacy for next month.   4.Insomnia:increase nortriptyline  4.Sleep hygiene:making some improvements here 7. Rheumatology follow up as directed.   Fifteen minutes of face to face patient care time were spent during this visit. All questions were encouraged and answered.  Follow up with me or NP in 2 mos .

## 2020-01-28 ENCOUNTER — Other Ambulatory Visit: Payer: Self-pay | Admitting: Physical Medicine & Rehabilitation

## 2020-01-28 DIAGNOSIS — G43719 Chronic migraine without aura, intractable, without status migrainosus: Secondary | ICD-10-CM

## 2020-02-13 ENCOUNTER — Other Ambulatory Visit: Payer: Self-pay

## 2020-02-13 ENCOUNTER — Encounter: Payer: Medicaid Other | Attending: Physical Medicine & Rehabilitation | Admitting: Psychology

## 2020-02-13 DIAGNOSIS — G35 Multiple sclerosis: Secondary | ICD-10-CM | POA: Diagnosis not present

## 2020-02-13 DIAGNOSIS — Z5181 Encounter for therapeutic drug level monitoring: Secondary | ICD-10-CM | POA: Insufficient documentation

## 2020-02-13 DIAGNOSIS — F4310 Post-traumatic stress disorder, unspecified: Secondary | ICD-10-CM

## 2020-02-13 DIAGNOSIS — G894 Chronic pain syndrome: Secondary | ICD-10-CM

## 2020-02-13 DIAGNOSIS — Z79899 Other long term (current) drug therapy: Secondary | ICD-10-CM | POA: Insufficient documentation

## 2020-02-13 DIAGNOSIS — F331 Major depressive disorder, recurrent, moderate: Secondary | ICD-10-CM | POA: Diagnosis not present

## 2020-02-13 DIAGNOSIS — M329 Systemic lupus erythematosus, unspecified: Secondary | ICD-10-CM | POA: Insufficient documentation

## 2020-02-13 DIAGNOSIS — M545 Low back pain: Secondary | ICD-10-CM | POA: Insufficient documentation

## 2020-02-13 DIAGNOSIS — R531 Weakness: Secondary | ICD-10-CM | POA: Insufficient documentation

## 2020-02-13 DIAGNOSIS — M5416 Radiculopathy, lumbar region: Secondary | ICD-10-CM | POA: Insufficient documentation

## 2020-02-13 DIAGNOSIS — K861 Other chronic pancreatitis: Secondary | ICD-10-CM | POA: Insufficient documentation

## 2020-02-13 DIAGNOSIS — G43719 Chronic migraine without aura, intractable, without status migrainosus: Secondary | ICD-10-CM | POA: Insufficient documentation

## 2020-02-24 ENCOUNTER — Other Ambulatory Visit: Payer: Self-pay | Admitting: Registered Nurse

## 2020-02-24 DIAGNOSIS — G43719 Chronic migraine without aura, intractable, without status migrainosus: Secondary | ICD-10-CM

## 2020-02-28 ENCOUNTER — Encounter: Payer: Self-pay | Admitting: Psychology
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
## 2020-03-01 ENCOUNTER — Encounter: Payer: Medicaid Other | Admitting: Registered Nurse

## 2020-03-09 ENCOUNTER — Other Ambulatory Visit: Payer: Self-pay | Admitting: Registered Nurse

## 2020-03-09 ENCOUNTER — Encounter: Payer: Medicaid Other | Admitting: Registered Nurse

## 2020-03-09 DIAGNOSIS — M329 Systemic lupus erythematosus, unspecified: Secondary | ICD-10-CM

## 2020-03-09 DIAGNOSIS — R531 Weakness: Secondary | ICD-10-CM

## 2020-03-09 DIAGNOSIS — G35 Multiple sclerosis: Secondary | ICD-10-CM

## 2020-03-12 ENCOUNTER — Encounter: Payer: Medicaid Other | Attending: Physical Medicine & Rehabilitation | Admitting: Registered Nurse

## 2020-03-12 ENCOUNTER — Other Ambulatory Visit: Payer: Self-pay

## 2020-03-12 ENCOUNTER — Encounter: Payer: Self-pay | Admitting: Registered Nurse

## 2020-03-12 ENCOUNTER — Telehealth: Payer: Self-pay | Admitting: *Deleted

## 2020-03-12 VITALS — BP 114/82 | HR 114 | Temp 98.1°F | Ht 63.0 in | Wt 238.0 lb

## 2020-03-12 DIAGNOSIS — G894 Chronic pain syndrome: Secondary | ICD-10-CM

## 2020-03-12 DIAGNOSIS — Z5181 Encounter for therapeutic drug level monitoring: Secondary | ICD-10-CM

## 2020-03-12 DIAGNOSIS — Z79899 Other long term (current) drug therapy: Secondary | ICD-10-CM

## 2020-03-12 DIAGNOSIS — G35 Multiple sclerosis: Secondary | ICD-10-CM

## 2020-03-12 DIAGNOSIS — K861 Other chronic pancreatitis: Secondary | ICD-10-CM | POA: Insufficient documentation

## 2020-03-12 DIAGNOSIS — M329 Systemic lupus erythematosus, unspecified: Secondary | ICD-10-CM | POA: Diagnosis present

## 2020-03-12 DIAGNOSIS — M5416 Radiculopathy, lumbar region: Secondary | ICD-10-CM | POA: Insufficient documentation

## 2020-03-12 DIAGNOSIS — G43719 Chronic migraine without aura, intractable, without status migrainosus: Secondary | ICD-10-CM | POA: Insufficient documentation

## 2020-03-12 DIAGNOSIS — R531 Weakness: Secondary | ICD-10-CM

## 2020-03-12 DIAGNOSIS — M47816 Spondylosis without myelopathy or radiculopathy, lumbar region: Secondary | ICD-10-CM

## 2020-03-12 DIAGNOSIS — M545 Low back pain: Secondary | ICD-10-CM | POA: Insufficient documentation

## 2020-03-12 DIAGNOSIS — F4310 Post-traumatic stress disorder, unspecified: Secondary | ICD-10-CM | POA: Diagnosis not present

## 2020-03-12 MED ORDER — OXYCODONE HCL 10 MG PO TABS: 10.0000 mg | ORAL_TABLET | Freq: Four times a day (QID) | ORAL | 0 refills | Status: DC | PRN

## 2020-03-12 NOTE — Telephone Encounter (Signed)
Ms Dorothey Baseman called and says that Val's medication was sent to [facility] [place] and they do not have the medication. She is requesting it be sent to her normal pharmacy [facility] on [facility] [address] [facility]

## 2020-03-12 NOTE — Progress Notes (Signed)
Subjective:    Patient ID: Mary [name], adult    DOB: 10/30/1975, 44 y.o.   MRN: [id number]  HPI: Mary [name] is a 44 y.o. female who identifies as a female,  ( his partner in room).  His partner Mary [name] reports he had a seizure on 03/11/2020 and has experienced memory loss. He Mary [name]) unable to state his name, looks up when name is called and  recognizes his partner. This provider spoke with Mary [name], she states Mary [name] has seen neurologist in the past and was told Mary [name] was not having seizures but these episodes are related to his Lupus, she is being followed by Rheumatology for the above.    He returns for follow up appointment for chronic pain and medication refill. When asked Mary [name] where he has pain he points to his head. He  rates his pain 9. His  current exercise regime is walking .  Mary [name] identifies as female, his  Morphine equivalent is 60.00  MME.  He is also prescribed Clonazepam by Mary [name]. I have discussed the black box warning with Mary [name] ( partner) of using opioids and benzodiazepines. I highlighted the dangers of using these drugs together and discussed the adverse events including respiratory suppression, overdose, cognitive impairment and importance of compliance with current regimen.  She verbalizes understanding.  We will continue to monitor and adjust as indicated.  He is being closely monitored and under the care of his psychotherapist and Psychologist Mary [name].  Pain Inventory Average Pain 6 Pain Right Now 9 My pain is constant, sharp, stabbing, tingling and aching  In the last 24 hours, has pain interfered with the following? General activity 5 Relation with others 3 Enjoyment of life 8 What TIME of day is your pain at its worst? evening Sleep (in general) Poor  Pain is worse with: some activites Pain improves with: rest and medication Relief from Meds: 6  Mobility how many minutes can you walk? 10 MINS ability to climb  steps?  yes do you drive?  no  Function disabled: date disabled Many years. I need assistance with the following:  feeding, dressing, bathing, toileting, meal prep, household duties and shopping Do you have any goals in this area?  yes  Neuro/Psych bowel control problems weakness numbness tremor tingling trouble walking confusion depression anxiety  Prior Studies Any changes since last visit?  no  Physicians involved in your care Any changes since last visit?  no   Family History  Problem Relation Age of Onset  . Diabetes Mother   . Hypertension Mother   . Cervical cancer Mother   . Heart disease Mother   . Diabetes Father   . Hypertension Father   . Heart disease Father   . Colitis Maternal Aunt   . Diabetes Other        many family members  . CVA Maternal Grandmother   . Lupus Neg Hx   . Sarcoidosis Neg Hx   . Pancreatitis Neg Hx   . Ataxia Neg Hx   . Chorea Neg Hx   . Dementia Neg Hx   . Mental retardation Neg Hx   . Migraines Neg Hx   . Multiple sclerosis Neg Hx   . Neurofibromatosis Neg Hx   . Neuropathy Neg Hx   . Parkinsonism Neg Hx   . Seizures Neg Hx   . Stroke Neg Hx   . Colon cancer Neg Hx    Social History   Socioeconomic History  .  Marital status: Single    Spouse name: Not on file  . Number of children: 0  . Years of education: Not on file  . Highest education level: Not on file  Occupational History  . Not on file  Tobacco Use  . Smoking status: Former Smoker    Packs/day: 0.50    Years: 20.00    Pack years: 10.00    Types: Cigarettes    Quit date: 05/22/2013    Years since quitting: 6.8  . Smokeless tobacco: Never Used  Substance and Sexual Activity  . Alcohol use: No    Alcohol/week: 0.0 standard drinks  . Drug use: No  . Sexual activity: Never    Birth control/protection: Abstinence  Other Topics Concern  . Not on file  Social History Narrative   ** Merged History Encounter **       Lives permanently in [place] with mom  and stepfather.  Has not started working yet and was unemployed in [place].         Social Determinants of Health   Financial Resource Strain:   . Difficulty of Paying Living Expenses:   Food Insecurity:   . Worried About Programme researcher, broadcasting/film/video in the Last Year:   . Barista in the Last Year:   Transportation Needs:   . Freight forwarder (Medical):   Marland Kitchen Lack of Transportation (Non-Medical):   Physical Activity:   . Days of Exercise per Week:   . Minutes of Exercise per Session:   Stress:   . Feeling of Stress :   Social Connections:   . Frequency of Communication with Friends and Family:   . Frequency of Social Gatherings with Friends and Family:   . Attends Religious Services:   . Active Member of Clubs or Organizations:   . Attends Banker Meetings:   Marland Kitchen Marital Status:    Past Surgical History:  Procedure Laterality Date  . ABDOMINAL SURGERY  ~ 2007   some sort of pancreatic cyst drainage.   . ABDOMINAL SURGERY    . ADENOIDECTOMY    . CHOLECYSTECTOMY     ~ age 78-laparoscopic  . CHOLECYSTECTOMY    . EUS N/A 04/14/2013   Procedure: UPPER ENDOSCOPIC ULTRASOUND (EUS) LINEAR;  Surgeon: Rachael Fee, MD;  Location: [facility];  Service: Endoscopy;  Laterality: N/A;  . HERNIA REPAIR  2017  . ROUX-EN-Y GASTRIC BYPASS    . ROUX-EN-Y PROCEDURE     stent to pancreatic cyst that became infected within 36 hours   Past Medical History:  Diagnosis Date  . Abdominal hernia   . Adrenal insufficiency (HCC) 04/23/2013   ??  Marland Kitchen Anxiety   . Depression    "recent breakup with partner of 15 yrs"  . Depression   . GERD (gastroesophageal reflux disease)   . Hernia 03-24-13   ventral hernia remains   . Lupus (HCC)   . MS (multiple sclerosis) (HCC)   . 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 Pancreatic cyst 2002 onset  . Pancreatitis 03-24-13   2002, 2'2013, 03-14-13  . PTSD (post-traumatic stress disorder)   . Ventral hernia    BP  114/82   Pulse (!) 114   Temp 98.1 F (36.7 C)   Ht 5\' 3"  (1.6 m)   Wt (!) 238 lb (108 kg)   SpO2 95%   BMI 42.16 kg/m   Opioid Risk Score:   Fall Risk Score:  `1  Depression  screen PHQ 2/9  Depression screen [facility] 2/9 05/18/2019 06/29/2018 04/28/2018 12/28/2017 05/13/2017 04/09/2016 10/01/2015  Decreased Interest 1 1 1 1 3 2 2   Down, Depressed, Hopeless 1 1 1 1 3 2 2   PHQ - 2 Score 2 2 2 2 6 4 4   Altered sleeping - - - 1 - - -  Tired, decreased energy - - - 1 - - -  Change in appetite - - - 1 - - -  Feeling bad or failure about yourself  - - - 1 - - -  Trouble concentrating - - - 1 - - -  Moving slowly or fidgety/restless - - - 1 - - -  Suicidal thoughts - - - 0 - - -  PHQ-9 Score - - - 8 - - -  Difficult doing work/chores - - - Somewhat difficult - - -   Review of Systems  Constitutional: Negative.   HENT: Negative.   Eyes: Negative.   Respiratory: Negative.   Cardiovascular: Negative.   Gastrointestinal: Positive for diarrhea.  Endocrine: Negative.   Genitourinary: Negative.   Musculoskeletal: Positive for back pain, gait problem and neck pain.       Joint pain  Allergic/Immunologic: Negative.   Neurological: Positive for tremors, weakness and headaches.       Tingling  Psychiatric/Behavioral: Positive for confusion.  All other systems reviewed and are negative.      Objective:   Physical Exam Vitals and nursing note reviewed.  Constitutional:      Comments: Alert X1  Cardiovascular:     Rate and Rhythm: Normal rate and regular rhythm.     Pulses: Normal pulses.     Heart sounds: Normal heart sounds.  Pulmonary:     Effort: Pulmonary effort is normal.     Breath sounds: Normal breath sounds.  Musculoskeletal:     Cervical back: Normal range of motion and neck supple.     Comments: Normal Muscle Bulk and Muscle Testing Reveals:  Upper Extremities: Full ROM and Muscle Strength On Right 4/5 and Left 5/5 Lumbar Paraspinal Tenderness: L-4-L-5 Lower Extremities:  Full ROM and Muscle Strength 5/5 Right Lower Extremity Flexion Produces Pain into his Right Lower Extremity. ( He Pointed to his right Lower extremity when asked if he was having any pain) Arises from Table slowly Narrow Based  Gait   Skin:    General: Skin is warm and dry.  Neurological:     Mental Status: He is alert and oriented to person, place, and time.  Psychiatric:        Mood and Affect: Mood normal.        Behavior: Behavior normal.           Assessment & Plan:  1. MS: Neurology Following. Continue to Monitor. 03/12/2020 2. 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 #120. Second script sent for the following month. 03/12/2020. 3. Low back pain with Radiculopathy RLE/: Continued topamax.03/12/2020 4. Chronic pancreatitis: PCP and Gastroenterologist Following. 03/12/2020  5. Insomnia: Continue Pamelor. 03/12/2020 6. PTSD: Psychiatry Following. Mary. Kieth [name] following.Marland Kitchen 03/12/2020. 7. Lupus: Rheumatology Following. 03/12/2020. 8.ChronicMigraine/ Vascular Headaches: Continue Imitrex,Topamaxand Aimovig.03/12/2020. 9. Right Knee Pain:No complaints today.Continue HEP as Tolerated. Continue to Monitor.03/12/2020. 10. Muscle Spasm: Continue Baclofen/ Robaxin. Continue to Monitor.03/12/2020  20 minutes of face to face patient care time was spent during this visit. All questions were encouraged and answered.  F/U in 2 months

## 2020-03-27 ENCOUNTER — Encounter: Payer: Medicaid Other | Attending: Physical Medicine & Rehabilitation | Admitting: Psychology

## 2020-03-27 DIAGNOSIS — R531 Weakness: Secondary | ICD-10-CM | POA: Insufficient documentation

## 2020-03-27 DIAGNOSIS — G43719 Chronic migraine without aura, intractable, without status migrainosus: Secondary | ICD-10-CM | POA: Insufficient documentation

## 2020-03-27 DIAGNOSIS — Z79899 Other long term (current) drug therapy: Secondary | ICD-10-CM | POA: Insufficient documentation

## 2020-03-27 DIAGNOSIS — M329 Systemic lupus erythematosus, unspecified: Secondary | ICD-10-CM | POA: Insufficient documentation

## 2020-03-27 DIAGNOSIS — G894 Chronic pain syndrome: Secondary | ICD-10-CM | POA: Insufficient documentation

## 2020-03-27 DIAGNOSIS — K861 Other chronic pancreatitis: Secondary | ICD-10-CM | POA: Insufficient documentation

## 2020-03-27 DIAGNOSIS — Z5181 Encounter for therapeutic drug level monitoring: Secondary | ICD-10-CM | POA: Insufficient documentation

## 2020-03-27 DIAGNOSIS — M545 Low back pain: Secondary | ICD-10-CM | POA: Insufficient documentation

## 2020-03-27 DIAGNOSIS — G35 Multiple sclerosis: Secondary | ICD-10-CM | POA: Insufficient documentation

## 2020-03-27 DIAGNOSIS — M5416 Radiculopathy, lumbar region: Secondary | ICD-10-CM | POA: Insufficient documentation

## 2020-04-20 ENCOUNTER — Other Ambulatory Visit: Payer: Self-pay | Admitting: Registered Nurse

## 2020-04-20 ENCOUNTER — Other Ambulatory Visit: Payer: Self-pay | Admitting: Physical Medicine & Rehabilitation

## 2020-04-20 DIAGNOSIS — G43719 Chronic migraine without aura, intractable, without status migrainosus: Secondary | ICD-10-CM

## 2020-04-20 DIAGNOSIS — R531 Weakness: Secondary | ICD-10-CM

## 2020-04-20 DIAGNOSIS — M329 Systemic lupus erythematosus, unspecified: Secondary | ICD-10-CM

## 2020-04-20 DIAGNOSIS — G894 Chronic pain syndrome: Secondary | ICD-10-CM

## 2020-04-20 DIAGNOSIS — M5416 Radiculopathy, lumbar region: Secondary | ICD-10-CM

## 2020-04-25 ENCOUNTER — Ambulatory Visit: Payer: Medicaid Other | Admitting: Psychology

## 2020-04-26 ENCOUNTER — Encounter: Payer: Medicaid Other | Admitting: Registered Nurse

## 2020-05-01 ENCOUNTER — Encounter: Payer: Medicaid Other | Admitting: Psychology

## 2020-05-09 ENCOUNTER — Encounter: Payer: Self-pay | Admitting: Registered Nurse

## 2020-05-09 ENCOUNTER — Encounter: Payer: Medicaid Other | Attending: Physical Medicine & Rehabilitation | Admitting: Registered Nurse

## 2020-05-09 ENCOUNTER — Other Ambulatory Visit: Payer: Self-pay

## 2020-05-09 VITALS — BP 113/82 | Temp 97.9°F | Ht 63.0 in | Wt 244.0 lb

## 2020-05-09 DIAGNOSIS — G894 Chronic pain syndrome: Secondary | ICD-10-CM

## 2020-05-09 DIAGNOSIS — M47816 Spondylosis without myelopathy or radiculopathy, lumbar region: Secondary | ICD-10-CM

## 2020-05-09 DIAGNOSIS — R531 Weakness: Secondary | ICD-10-CM | POA: Diagnosis present

## 2020-05-09 DIAGNOSIS — M5416 Radiculopathy, lumbar region: Secondary | ICD-10-CM | POA: Insufficient documentation

## 2020-05-09 DIAGNOSIS — G43719 Chronic migraine without aura, intractable, without status migrainosus: Secondary | ICD-10-CM | POA: Insufficient documentation

## 2020-05-09 DIAGNOSIS — K861 Other chronic pancreatitis: Secondary | ICD-10-CM | POA: Insufficient documentation

## 2020-05-09 DIAGNOSIS — M25561 Pain in right knee: Secondary | ICD-10-CM

## 2020-05-09 DIAGNOSIS — M329 Systemic lupus erythematosus, unspecified: Secondary | ICD-10-CM

## 2020-05-09 DIAGNOSIS — M25551 Pain in right hip: Secondary | ICD-10-CM

## 2020-05-09 DIAGNOSIS — F4310 Post-traumatic stress disorder, unspecified: Secondary | ICD-10-CM

## 2020-05-09 DIAGNOSIS — M545 Low back pain: Secondary | ICD-10-CM | POA: Insufficient documentation

## 2020-05-09 DIAGNOSIS — Z5181 Encounter for therapeutic drug level monitoring: Secondary | ICD-10-CM | POA: Diagnosis present

## 2020-05-09 DIAGNOSIS — Z79899 Other long term (current) drug therapy: Secondary | ICD-10-CM | POA: Diagnosis present

## 2020-05-09 DIAGNOSIS — G35 Multiple sclerosis: Secondary | ICD-10-CM

## 2020-05-09 MED ORDER — OXYCODONE HCL 10 MG PO TABS: 10.0000 mg | ORAL_TABLET | Freq: Four times a day (QID) | ORAL | 0 refills | Status: DC | PRN

## 2020-05-09 NOTE — Progress Notes (Signed)
Subjective:    Patient ID: Mary [name], adult    DOB: 10/16/1975, 44 y.o.   MRN: [id number]  HPI: Mary [name] is a 44 y.o. female who identifies as a female, returns for follow up appointment for chronic pain and medication refill. He states his pain is located in his right hip and right knee. Also reports he had MS flare 5 weeks ago, that has resolve over the last two weeks. He rates his pain 6. His current exercise regime is walking and performing stretching exercises.  Mary [name] identifies as female, his  Morphine equivalent is 60.00 MME.He is also prescribed Clonazepam by Barbette Merino. We have discussed the black box warning of using opioids and benzodiazepines. I highlighted the dangers of using these drugs together and discussed the adverse events including respiratory suppression, overdose, cognitive impairment and importance of compliance with current regimen. We will continue to monitor and adjust as indicated.  He is being closely monitored and under the care of his psychiatrist and Psychology.   Oral Swab was Performed Today.   Mother in room, she states she will be going out of town and unable to bring Mary [name] to scheduled appointment till the following week. Two prescriptions were sent. Mary [name] was instructed to send a My-Chart message in November and prescription will be sent to accommodate scheduled appointment. She verbalizes understanding.     Pain Inventory Average Pain 6 Pain Right Now 6 My pain is constant, sharp, burning, dull, stabbing, tingling and aching  In the last 24 hours, has pain interfered with the following? General activity 7 Relation with others 9 Enjoyment of life 7 What TIME of day is your pain at its worst? morning  and evening Sleep (in general) Poor  Pain is worse with: bending, standing and some activites Pain improves with: rest and medication Relief from Meds: 8  Family History  Problem Relation Age of Onset  . Diabetes Mother   .  Hypertension Mother   . Cervical cancer Mother   . Heart disease Mother   . Diabetes Father   . Hypertension Father   . Heart disease Father   . Colitis Maternal Aunt   . Diabetes Other        many family members  . CVA Maternal Grandmother   . Lupus Neg Hx   . Sarcoidosis Neg Hx   . Pancreatitis Neg Hx   . Ataxia Neg Hx   . Chorea Neg Hx   . Dementia Neg Hx   . Mental retardation Neg Hx   . Migraines Neg Hx   . Multiple sclerosis Neg Hx   . Neurofibromatosis Neg Hx   . Neuropathy Neg Hx   . Parkinsonism Neg Hx   . Seizures Neg Hx   . Stroke Neg Hx   . Colon cancer Neg Hx    Social History   Socioeconomic History  . Marital status: Single    Spouse name: Not on file  . Number of children: 0  . Years of education: Not on file  . Highest education level: Not on file  Occupational History  . Not on file  Tobacco Use  . Smoking status: Former Smoker    Packs/day: 0.50    Years: 20.00    Pack years: 10.00    Types: Cigarettes    Quit date: 05/22/2013    Years since quitting: 6.9  . Smokeless tobacco: Never Used  Substance and Sexual Activity  . Alcohol use: No  Alcohol/week: 0.0 standard drinks  . Drug use: No  . Sexual activity: Never    Birth control/protection: Abstinence  Other Topics Concern  . Not on file  Social History Narrative   ** Merged History Encounter **       Lives permanently in [place] with mom and stepfather.  Has not started working yet and was unemployed in [place].         Social Determinants of Health   Financial Resource Strain:   . Difficulty of Paying Living Expenses: Not on file  Food Insecurity:   . Worried About Programme researcher, broadcasting/film/video in the Last Year: Not on file  . Ran Out of Food in the Last Year: Not on file  Transportation Needs:   . Lack of Transportation (Medical): Not on file  . Lack of Transportation (Non-Medical): Not on file  Physical Activity:   . Days of Exercise per Week: Not on file  . Minutes of Exercise per  Session: Not on file  Stress:   . Feeling of Stress : Not on file  Social Connections:   . Frequency of Communication with Friends and Family: Not on file  . Frequency of Social Gatherings with Friends and Family: Not on file  . Attends Religious Services: Not on file  . Active Member of Clubs or Organizations: Not on file  . Attends Banker Meetings: Not on file  . Marital Status: Not on file   Past Surgical History:  Procedure Laterality Date  . ABDOMINAL SURGERY  ~ 2007   some sort of pancreatic cyst drainage.   . ABDOMINAL SURGERY    . ADENOIDECTOMY    . CHOLECYSTECTOMY     ~ age 61-laparoscopic  . CHOLECYSTECTOMY    . EUS N/A 04/14/2013   Procedure: UPPER ENDOSCOPIC ULTRASOUND (EUS) LINEAR;  Surgeon: Rachael Fee, MD;  Location: [facility];  Service: Endoscopy;  Laterality: N/A;  . HERNIA REPAIR  2017  . ROUX-EN-Y GASTRIC BYPASS    . ROUX-EN-Y PROCEDURE     stent to pancreatic cyst that became infected within 36 hours   Past Surgical History:  Procedure Laterality Date  . ABDOMINAL SURGERY  ~ 2007   some sort of pancreatic cyst drainage.   . ABDOMINAL SURGERY    . ADENOIDECTOMY    . CHOLECYSTECTOMY     ~ age 61-laparoscopic  . CHOLECYSTECTOMY    . EUS N/A 04/14/2013   Procedure: UPPER ENDOSCOPIC ULTRASOUND (EUS) LINEAR;  Surgeon: Rachael Fee, MD;  Location: [facility];  Service: Endoscopy;  Laterality: N/A;  . HERNIA REPAIR  2017  . ROUX-EN-Y GASTRIC BYPASS    . ROUX-EN-Y PROCEDURE     stent to pancreatic cyst that became infected within 36 hours   Past Medical History:  Diagnosis Date  . Abdominal hernia   . Adrenal insufficiency (HCC) 04/23/2013   ??  Marland Kitchen Anxiety   . Depression    "recent breakup with partner of 15 yrs"  . Depression   . GERD (gastroesophageal reflux disease)   . Hernia 03-24-13   ventral hernia remains   . Lupus (HCC)   . MS (multiple sclerosis) (HCC)   . 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 Pancreatic cyst 2002 onset  . Pancreatitis 03-24-13   2002, 2'2013, 03-14-13  . PTSD (post-traumatic stress disorder)   . Ventral hernia    Temp 97.9 F (36.6 C)   Ht 5\' 3"  (1.6 m)   Wt  244 lb (110.7 kg)   BMI 43.22 kg/m   Opioid Risk Score:   Fall Risk Score:  `1  Depression screen PHQ 2/9  Depression screen [facility] 2/9 03/12/2020 05/18/2019 06/29/2018 04/28/2018 12/28/2017 05/13/2017 04/09/2016  Decreased Interest 0 1 1 1 1 3 2   Down, Depressed, Hopeless 0 1 1 1 1 3 2   PHQ - 2 Score 0 2 2 2 2 6 4   Altered sleeping - - - - 1 - -  Tired, decreased energy - - - - 1 - -  Change in appetite - - - - 1 - -  Feeling bad or failure about yourself  - - - - 1 - -  Trouble concentrating - - - - 1 - -  Moving slowly or fidgety/restless - - - - 1 - -  Suicidal thoughts - - - - 0 - -  PHQ-9 Score - - - - 8 - -  Difficult doing work/chores - - - - Somewhat difficult - -    Review of Systems  Constitutional: Negative.   HENT: Negative.   Eyes: Negative.   Respiratory: Negative.   Cardiovascular: Negative.   Gastrointestinal: Positive for abdominal pain.  Endocrine: Negative.   Genitourinary: Negative.   Musculoskeletal: Positive for arthralgias and back pain.  Skin: Negative.   Allergic/Immunologic: Negative.   Neurological: Positive for weakness and numbness.       Tingling  Psychiatric/Behavioral: Positive for dysphoric mood. The patient is nervous/anxious.   All other systems reviewed and are negative.      Objective:   Physical Exam Vitals and nursing note reviewed.  Constitutional:      Appearance: Normal appearance. He is obese.  Cardiovascular:     Rate and Rhythm: Normal rate and regular rhythm.     Pulses: Normal pulses.     Heart sounds: Normal heart sounds.  Pulmonary:     Effort: Pulmonary effort is normal.     Breath sounds: Normal breath sounds.  Musculoskeletal:     Cervical back: Normal range of motion and neck supple.     Comments: Normal Muscle Bulk and  Muscle Testing Reveals:  Upper Extremities: Full ROM and Muscle Strength 5/5  Lumbar Paraspinal Tenderness: L-4-L-5 Right Greater Trochanter Tenderness Lower Extremities: Right: Decreased ROM and Muscle Strength 5/5 Right Lower Extremity Flexion Produces Pain into Right Patella Left Lower Extremity: Full ROM and Muscle Strength 5/5 Arises from Table with Ease Narrow Based Gait   Skin:    General: Skin is warm and dry.  Neurological:     Mental Status: He is alert and oriented to person, place, and time.  Psychiatric:        Mood and Affect: Mood normal.        Behavior: Behavior normal.           Assessment & Plan:  1. MS: Neurology Following.Continue to Monitor. 05/09/2020 2. 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 Refilled: Oxycodone 10 mg one tablet every 6 hours as needed. #120.Second script sent for the following month. 05/09/2020. We will continue the opioid monitoring program, this consists of regular clinic visits, examinations, urine drug screen, pill counts as well as use of Controlled Substance Reporting system. A 12 month History has been reviewed on the 05/11/2020 Controlled Substance Reporting System on 05/09/2020 3. Low back pain with Radiculopathy RLE/: Continued topamax.05/09/2020 4. Chronic pancreatitis: PCP and Gastroenterologist Following. 05/09/2020  5. Insomnia: Continue  Pamelor.Continue to Monitor.05/09/2020 6. PTSD: Psychiatry and Dr. Kieth Brightly following..05/09/2020. 7. Lupus: Rheumatology Following.05/09/2020. 8.ChronicMigraine/ Vascular Headaches: Continue Imitrex,Topamaxand Aimovig.Continue to Monitor. 05/09/2020. 9. Right Knee Pain: Continue HEP as Tolerated. Continue to Monitor.05/09/2020. 10. Muscle Spasm: Continue Baclofen/ Robaxin. Continue to Monitor.05/09/2020  20 minutes of face to face patient care time was spent during this visit. All  questions were encouraged and answered.    F/U in 2 months

## 2020-05-14 LAB — DRUG TOX MONITOR 1 W/CONF, ORAL FLD
Alprazolam: NEGATIVE ng/mL (ref <0.50)
Amphetamines: NEGATIVE ng/mL (ref <10)
Barbiturates: NEGATIVE ng/mL (ref <10)
Benzodiazepines: POSITIVE ng/mL — AB (ref <0.50)
Buprenorphine: NEGATIVE ng/mL (ref <0.10)
Chlordiazepoxide: NEGATIVE ng/mL (ref <0.50)
Clonazepam: 1.19 ng/mL — ABNORMAL HIGH (ref <0.50)
Cocaine: NEGATIVE ng/mL (ref <5.0)
Codeine: NEGATIVE ng/mL (ref <2.5)
Cotinine: 30.6 ng/mL — ABNORMAL HIGH (ref <5.0)
Diazepam: NEGATIVE ng/mL (ref <0.50)
Dihydrocodeine: NEGATIVE ng/mL (ref <2.5)
Fentanyl: NEGATIVE ng/mL (ref <0.10)
Flunitrazepam: NEGATIVE ng/mL (ref <0.50)
Flurazepam: NEGATIVE ng/mL (ref <0.50)
Heroin Metabolite: NEGATIVE ng/mL (ref <1.0)
Hydrocodone: NEGATIVE ng/mL (ref <2.5)
Hydromorphone: NEGATIVE ng/mL (ref <2.5)
Lorazepam: NEGATIVE ng/mL (ref <0.50)
MARIJUANA: NEGATIVE ng/mL (ref <2.5)
MDMA: NEGATIVE ng/mL (ref <10)
Meprobamate: NEGATIVE ng/mL (ref <2.5)
Methadone: NEGATIVE ng/mL (ref <5.0)
Midazolam: NEGATIVE ng/mL (ref <0.50)
Morphine: NEGATIVE ng/mL (ref <2.5)
Nicotine Metabolite: POSITIVE ng/mL — AB (ref <5.0)
Nordiazepam: NEGATIVE ng/mL (ref <0.50)
Norhydrocodone: NEGATIVE ng/mL (ref <2.5)
Noroxycodone: 97.1 ng/mL — ABNORMAL HIGH (ref <2.5)
Opiates: POSITIVE ng/mL — AB (ref <2.5)
Oxazepam: NEGATIVE ng/mL (ref <0.50)
Oxycodone: >250 ng/mL — ABNORMAL HIGH (ref <2.5)
Oxymorphone: 6.6 ng/mL — ABNORMAL HIGH (ref <2.5)
Phencyclidine: NEGATIVE ng/mL (ref <10)
Tapentadol: NEGATIVE ng/mL (ref <5.0)
Temazepam: NEGATIVE ng/mL (ref <0.50)
Tramadol: NEGATIVE ng/mL (ref <5.0)
Triazolam: NEGATIVE ng/mL (ref <0.50)
Zolpidem: NEGATIVE ng/mL (ref <5.0)

## 2020-05-14 LAB — DRUG TOX ALC METAB W/CON, ORAL FLD: Alcohol Metabolite: NEGATIVE ng/mL (ref <25)

## 2020-05-15 ENCOUNTER — Telehealth: Payer: Self-pay | Admitting: *Deleted

## 2020-05-15 NOTE — Telephone Encounter (Signed)
Oral swab drug screen was consistent for prescribed medications.  ?

## 2020-05-22 ENCOUNTER — Other Ambulatory Visit: Payer: Self-pay

## 2020-05-22 ENCOUNTER — Encounter: Payer: Medicaid Other | Admitting: Psychology

## 2020-07-02 ENCOUNTER — Telehealth: Payer: Self-pay | Admitting: Internal Medicine

## 2020-07-02 NOTE — Telephone Encounter (Signed)
Pt scheduled to see Doug Sou PA tomorrow at 2:30pm. Please notify pt of appt.

## 2020-07-02 NOTE — Telephone Encounter (Signed)
Lawson Fiscal from [facility] is requesting for this pt to be seen this week if possible, pt has a pancreatic mass that was found at [facility] ED 06/29/2020, no appts available.

## 2020-07-02 NOTE — Telephone Encounter (Signed)
Patient called and was informed of appt. She stated will bring copy of records.

## 2020-07-03 ENCOUNTER — Encounter: Payer: Self-pay | Admitting: Gastroenterology

## 2020-07-03 ENCOUNTER — Ambulatory Visit (INDEPENDENT_AMBULATORY_CARE_PROVIDER_SITE_OTHER): Payer: Medicaid Other | Admitting: Gastroenterology

## 2020-07-03 VITALS — BP 102/64 | HR 130 | Ht 63.0 in | Wt 231.0 lb

## 2020-07-03 DIAGNOSIS — R1013 Epigastric pain: Secondary | ICD-10-CM

## 2020-07-03 DIAGNOSIS — R197 Diarrhea, unspecified: Secondary | ICD-10-CM

## 2020-07-03 DIAGNOSIS — K869 Disease of pancreas, unspecified: Secondary | ICD-10-CM

## 2020-07-03 DIAGNOSIS — K861 Other chronic pancreatitis: Secondary | ICD-10-CM

## 2020-07-03 NOTE — Progress Notes (Signed)
Noted  

## 2020-07-03 NOTE — Progress Notes (Signed)
07/03/2020 CHASEY DULL [id number] 1976/07/24   HISTORY OF PRESENT ILLNESS: This is a 44 year old female who is a patient of Dr. Lamar Sprinkles.  He has multiple medical problems as listed below.  He has longstanding history of recurrent pancreatitis and chronic pancreatitis with associated chronic pain syndrome.  Has undergone significant evaluation of the pancreas in the past including an EUS back in 2014.  He is here today because he presented to the ED through [facility] on 11/12 with complaints of chest pain, shortness of breath, tachycardia.  CT angio of the chest showed an ill-defined 2.3 x 2.4 cm mass involving the pancreatic body tail junction suspicious for pancreatic cancer.  He reports that he has epigastric abdominal pains during flares with his pancreas.  Has currently been having pain for the past couple weeks.  Also started having diarrhea for the past 3 to 4 weeks.  He does take Creon 48,000 units with meals.   Past Medical History:  Diagnosis Date  . Abdominal hernia   . Adrenal insufficiency (HCC) 04/23/2013   ??  Marland Kitchen Anxiety   . Depression    "recent breakup with partner of 15 yrs"  . Depression   . GERD (gastroesophageal reflux disease)   . Hernia 03-24-13   ventral hernia remains   . Lupus (HCC)   . MS (multiple sclerosis) (HCC)   . 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 Pancreatic cyst 2002 onset  . Pancreatitis 03-24-13   2002, 2'2013, 03-14-13  . PTSD (post-traumatic stress disorder)   . Ventral hernia    Past Surgical History:  Procedure Laterality Date  . ABDOMINAL SURGERY  ~ 2007   some sort of pancreatic cyst drainage.   . ABDOMINAL SURGERY    . ADENOIDECTOMY    . CHOLECYSTECTOMY     ~ age 26-laparoscopic  . CHOLECYSTECTOMY    . EUS N/A 04/14/2013   Procedure: UPPER ENDOSCOPIC ULTRASOUND (EUS) LINEAR;  Surgeon: Rachael Fee, MD;  Location: [facility];  Service: Endoscopy;  Laterality: N/A;  . HERNIA REPAIR  2017  .  ROUX-EN-Y GASTRIC BYPASS    . ROUX-EN-Y PROCEDURE     stent to pancreatic cyst that became infected within 36 hours    reports that he quit smoking about 7 years ago. His smoking use included cigarettes. He has a 10.00 pack-year smoking history. He has never used smokeless tobacco. He reports that he does not drink alcohol and does not use drugs. family history includes CVA in his maternal grandmother; Cervical cancer in his mother; Colitis in his maternal aunt; Diabetes in his father, mother, and another family member; Heart disease in his father and mother; Hypertension in his father and mother. Allergies  Allergen Reactions  . Amoxicillin Anaphylaxis  . Buprenorphine Hcl Anaphylaxis and Nausea And Vomiting  . Latex Rash  . Morphine And Related Anaphylaxis and Nausea And Vomiting  . Penicillins Anaphylaxis and Rash    Did it involve swelling of the face/tongue/throat, SOB, or low BP? Unknown Did it involve sudden or severe rash/hives, skin peeling, or any reaction on the inside of your mouth or nose? Yes Did you need to seek medical attention at a hospital or doctor's office? Unknown When did it last happen?unknown - prior to 2013 If all above answers are "NO", may proceed with cephalosporin use.    . Meat Extract Other (See Comments)    Does not eat meat--only fish (due to pancreatitis)  . Other Other (  See Comments)    Does not eat meat--only fish (due to pancreatitis)      Outpatient Encounter Medications as of 07/03/2020  Medication Sig  . AIMOVIG 140 MG/ML SOAJ INJECT 140 MG INTO THE SKIN EVERY 30 DAYS  . albuterol (PROVENTIL HFA;VENTOLIN HFA) 108 (90 Base) MCG/ACT inhaler Inhale 2 puffs into the lungs every 6 (six) hours as needed for wheezing or shortness of breath.  Marland Kitchen alendronate (FOSAMAX) 70 MG tablet Take 70 mg by mouth every Wednesday. Take with a full glass of water on an empty stomach.   . ARIPiprazole (ABILIFY) 2 MG tablet Take 2 mg by mouth daily.  . baclofen  (LIORESAL) 10 MG tablet TAKE ONE TABLET BY MOUTH EVERY NIGHT AT BEDTIME AS NEEDED FOR MUSCLE SPASMS  . ferrous sulfate 325 (65 FE) MG tablet Take 325 mg by mouth at bedtime.  . hydroxychloroquine (PLAQUENIL) 200 MG tablet Take 300 mg by mouth at bedtime.   . hydrOXYzine (VISTARIL) 25 MG capsule Take 1 capsule by mouth 3 (three) times daily as needed.  Marland Kitchen ibuprofen (ADVIL,MOTRIN) 200 MG tablet Take 800 mg by mouth daily as needed for headache or moderate pain.  Marland Kitchen lipase/protease/amylase (CREON-12/PANCREASE) 12000 UNITS CPEP capsule Take 2 capsules by mouth 3 (three) times daily with meals.  . methocarbamol (ROBAXIN) 500 MG tablet TAKE ONE TABLET BY MOUTH DAILY AS NEEDED FOR MUSCLE SPASMS  . norethindrone (AYGESTIN) 5 MG tablet Take 5-10 mg by mouth See admin instructions. 2 tablet by mouth daily  . nortriptyline (PAMELOR) 50 MG capsule TAKE ONE CAPSULE BY MOUTH AT BEDTIME  . Oxycodone HCl 10 MG TABS Take 1 tablet (10 mg total) by mouth every 6 (six) hours as needed (severe pain).  . pantoprazole (PROTONIX) 40 MG tablet Take 1 tablet (40 mg total) by mouth 2 (two) times daily.  . predniSONE (DELTASONE) 5 MG tablet Take 2 tablets (10 mg total) by mouth daily with breakfast.  . promethazine (PHENERGAN) 25 MG suppository Place 1 suppository (25 mg total) rectally every 6 (six) hours as needed for nausea or vomiting.  . promethazine (PHENERGAN) 25 MG tablet TAKE ONE TABLET BY MOUTH DAILY AS NEEDED FOR NAUSEA OR VOMITING  . rivaroxaban (XARELTO) 20 MG TABS tablet Take 20 mg by mouth daily with breakfast.   . sertraline (ZOLOFT) 100 MG tablet Take 200 mg by mouth at bedtime.   . SUMAtriptan (IMITREX) 100 MG tablet TAKE ONE TABLET BY MOUTH AT ONSET OF HEADACHE; MAY REPEAT ONE TABLET IN 2 HOURS IF NEEDED.  Marland Kitchen topiramate (TOPAMAX) 100 MG tablet TAKE ONE TABLET BY MOUTH TWICE A DAY  . triamcinolone cream (KENALOG) 0.1 % Apply 1 application topically daily as needed (eczema).   . [DISCONTINUED] clonazePAM  (KLONOPIN) 1 MG tablet Take 1 mg by mouth 2 (two) times daily. (Patient not taking: Reported on 07/03/2020)  . [DISCONTINUED] Melatonin 5 MG TABS Take 5 mg by mouth at bedtime.  (Patient not taking: Reported on 07/03/2020)   No facility-administered encounter medications on file as of 07/03/2020.     REVIEW OF SYSTEMS  : All other systems reviewed and negative except where noted in the History of Present Illness.   PHYSICAL EXAM: BP 102/64   Pulse (!) 130   Ht 5\' 3"  (1.6 m)   Wt 238 lb 1.6 oz (108 kg)   SpO2 99%   BMI 42.18 kg/m  General: Well developed white adult in no acute distress Head: Normocephalic and atraumatic Eyes:  Sclerae anicteric, conjunctiva pink. Ears:  Normal auditory acuity Lungs: Clear throughout to auscultation; no W/R/R. Heart: Tachycardic, no M/R/G. Abdomen: Soft, non-distended.  BS present.  Epigastric TTP. Musculoskeletal: Symmetrical with no gross deformities  Skin: No lesions on visible extremities Extremities: No edema  Neurological: Alert oriented x 4, grossly non-focal Psychological:  Alert and cooperative. Normal mood and affect  ASSESSMENT AND PLAN: 44 year old with longstanding history of recurrent pancreatitis and now chronic pancreatitis.  Has chronic pain and is on pain medication.  Also takes pancreatic enzymes in the form of Creon 48,000 units with meals. *Pancreatic mass: CT angio of the chest earlier this week showed a 2.3 x 2.4 cm mass involving the pancreatic body tail junction suspicious for pancreatic cancer.  He has had abnormalities and lesions in the area of the pancreatic tail dating back several years.  Had an EUS about 7 years ago.  I discussed with Dr. Christella Hartigan and we are going to start with a dedicated CT of the abdomen with pancreatic protocol to further evaluate this first. *Diarrhea and epigastric abdominal pain: Diarrhea began about 3 to 4 weeks ago.  Question infectious source versus pancreatic issues.  He declined stool testing  for now.  I am going to have him increase his pancreatic enzymes to 72,000 units with meals and be sure that he takes 1 pill, 24,000 units with snacks as well.   CC:  [name], NP

## 2020-07-03 NOTE — Patient Instructions (Addendum)
If you are age 43 or older, your body mass index should be between 23-30. Your Body mass index is 42.18 kg/m. If this is out of the aforementioned range listed, please consider follow up with your Primary Care Provider.  If you are age 57 or younger, your body mass index should be between 19-25. Your Body mass index is 42.18 kg/m. If this is out of the aformentioned range listed, please consider follow up with your Primary Care Provider.   You have been scheduled for a CT scan of the abdomen and pelvis at [facility], 1st floor Radiology. You are scheduled on 07/13/20  at 9:00am. You should arrive 15 minutes prior to your appointment time for registration.  Please follow the written instructions below on the day of your exam:    Do not eat anything after 5am (4 hours prior to your test)   If you have any questions regarding your exam or if you need to reschedule, you may call [facility] at [phone] between the hours of 8:00 am and 5:00 pm, Monday-Friday.   Increase your creon to 3 pills with meals and make sure to take one with snacks   Thank you for choosing me and [facility].  Doug Sou , PA-C

## 2020-07-03 NOTE — Progress Notes (Signed)
Jess and John, Please let me know what an updated CT shows compared to her previous tail of pancreas abnormalities.

## 2020-07-13 ENCOUNTER — Ambulatory Visit (HOSPITAL_COMMUNITY)
Admission: RE | Admit: 2020-07-13 | Discharge: 2020-07-13 | Disposition: A | Discharge status: Home / Self Care | Payer: Medicaid Other | Source: Ambulatory Visit | Attending: Gastroenterology | Admitting: Gastroenterology

## 2020-07-13 ENCOUNTER — Other Ambulatory Visit: Payer: Self-pay

## 2020-07-13 ENCOUNTER — Encounter (HOSPITAL_COMMUNITY): Payer: Self-pay

## 2020-07-13 DIAGNOSIS — R1013 Epigastric pain: Secondary | ICD-10-CM

## 2020-07-13 DIAGNOSIS — K861 Other chronic pancreatitis: Secondary | ICD-10-CM | POA: Diagnosis present

## 2020-07-13 DIAGNOSIS — R197 Diarrhea, unspecified: Secondary | ICD-10-CM | POA: Diagnosis present

## 2020-07-13 DIAGNOSIS — K869 Disease of pancreas, unspecified: Secondary | ICD-10-CM

## 2020-07-13 MED ORDER — IOHEXOL 300 MG/ML  SOLN
100.0000 mL | Freq: Once | INTRAMUSCULAR | Status: AC | PRN
  Administered 2020-07-13: 100 mL via INTRAVENOUS

## 2020-07-17 ENCOUNTER — Telehealth: Payer: Self-pay | Admitting: Registered Nurse

## 2020-07-17 ENCOUNTER — Other Ambulatory Visit: Payer: Self-pay

## 2020-07-17 DIAGNOSIS — Z79899 Other long term (current) drug therapy: Secondary | ICD-10-CM

## 2020-07-17 DIAGNOSIS — M329 Systemic lupus erythematosus, unspecified: Secondary | ICD-10-CM

## 2020-07-17 DIAGNOSIS — R933 Abnormal findings on diagnostic imaging of other parts of digestive tract: Secondary | ICD-10-CM

## 2020-07-17 DIAGNOSIS — G894 Chronic pain syndrome: Secondary | ICD-10-CM

## 2020-07-17 DIAGNOSIS — R531 Weakness: Secondary | ICD-10-CM

## 2020-07-17 DIAGNOSIS — Z5181 Encounter for therapeutic drug level monitoring: Secondary | ICD-10-CM

## 2020-07-17 MED ORDER — OXYCODONE HCL 10 MG PO TABS: 10.0000 mg | ORAL_TABLET | Freq: Four times a day (QID) | ORAL | 0 refills | Status: DC | PRN

## 2020-07-17 NOTE — Telephone Encounter (Signed)
PMP was Reviewed: Oxycodone e-scribed. Mary [name] is aware via My- Chart message.

## 2020-07-18 ENCOUNTER — Telehealth (HOSPITAL_BASED_OUTPATIENT_CLINIC_OR_DEPARTMENT_OTHER): Payer: Medicaid Other | Admitting: Psychology

## 2020-07-18 ENCOUNTER — Encounter: Payer: Medicaid Other | Admitting: Psychology

## 2020-07-18 ENCOUNTER — Encounter: Payer: Self-pay | Admitting: Psychology

## 2020-07-18 DIAGNOSIS — F4310 Post-traumatic stress disorder, unspecified: Secondary | ICD-10-CM | POA: Insufficient documentation

## 2020-07-18 DIAGNOSIS — Z79899 Other long term (current) drug therapy: Secondary | ICD-10-CM | POA: Diagnosis present

## 2020-07-18 DIAGNOSIS — Z5181 Encounter for therapeutic drug level monitoring: Secondary | ICD-10-CM | POA: Diagnosis present

## 2020-07-18 DIAGNOSIS — G894 Chronic pain syndrome: Secondary | ICD-10-CM | POA: Diagnosis not present

## 2020-07-18 DIAGNOSIS — G35 Multiple sclerosis: Secondary | ICD-10-CM | POA: Diagnosis present

## 2020-07-18 DIAGNOSIS — R531 Weakness: Secondary | ICD-10-CM | POA: Insufficient documentation

## 2020-07-18 DIAGNOSIS — M2550 Pain in unspecified joint: Secondary | ICD-10-CM | POA: Diagnosis present

## 2020-07-18 DIAGNOSIS — G43719 Chronic migraine without aura, intractable, without status migrainosus: Secondary | ICD-10-CM | POA: Diagnosis present

## 2020-07-18 DIAGNOSIS — M47816 Spondylosis without myelopathy or radiculopathy, lumbar region: Secondary | ICD-10-CM | POA: Insufficient documentation

## 2020-07-18 DIAGNOSIS — M542 Cervicalgia: Secondary | ICD-10-CM | POA: Diagnosis present

## 2020-07-18 DIAGNOSIS — M329 Systemic lupus erythematosus, unspecified: Secondary | ICD-10-CM
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
## 2020-07-25 ENCOUNTER — Telehealth: Payer: Self-pay | Admitting: Gastroenterology

## 2020-07-25 DIAGNOSIS — R933 Abnormal findings on diagnostic imaging of other parts of digestive tract: Secondary | ICD-10-CM

## 2020-07-25 DIAGNOSIS — K869 Disease of pancreas, unspecified: Secondary | ICD-10-CM

## 2020-07-25 NOTE — Telephone Encounter (Signed)
New orders placed.  I spoke with MRI, If the auth can be obtained they can still perform tomorrow.  See Other phone notes.  I have reached out to the patient and the pre-cert coordinators to see if this can be obtained today.  Patient aware, I will call her back once I know something.

## 2020-07-25 NOTE — Telephone Encounter (Signed)
Patient does not require prec-cert per the precert coordinators.  Patient has been rescheduled for the MRI tomorrow am at 9:00.  She needs to arrive at 7:30 and be NPO after midnight.   Patient notified and has confirmed appt and instructions.

## 2020-07-25 NOTE — Telephone Encounter (Signed)
Byrd Hesselbach with [facility] hospital called to advise that order for MR Leslye Peer is incorrect. It needs to be MR Entero abd and pelvis w/wo contrast per the radiologist. They are going to cancel order and pt's appt that was scheduled for tomorrow at 9:00am because pt has Medicaid, which requires PA. She will call pt to inform. Byrd Hesselbach wants [name] to call her back once a new order is entered and PA has been obtained so she can call pt to r/s.

## 2020-07-25 NOTE — Telephone Encounter (Signed)
New orders placed.  I spoke with MRI, If the auth can be obtained they can still perform tomorrow.  See Other phone notes.  I have reached out to the patient and the pre-cert coordinators to see if this can be obtained today.  Patient aware, I will call her back once I know something.  

## 2020-07-25 NOTE — Telephone Encounter (Signed)
Pt is requesting a call back from a nurse to discuss a second half of her orders for the MRI she has scheduled for tomorrow.

## 2020-07-25 NOTE — Addendum Note (Signed)
Addended by: Annett Fabian on: 07/25/2020 04:04 PM   Modules accepted: Orders

## 2020-07-26 ENCOUNTER — Ambulatory Visit (HOSPITAL_COMMUNITY): Payer: Medicaid Other

## 2020-07-26 ENCOUNTER — Ambulatory Visit (HOSPITAL_COMMUNITY): Admission: RE | Admit: 2020-07-26 | Payer: Medicaid Other | Source: Ambulatory Visit

## 2020-07-30 ENCOUNTER — Telehealth: Payer: Self-pay | Admitting: Registered Nurse

## 2020-07-30 DIAGNOSIS — R531 Weakness: Secondary | ICD-10-CM

## 2020-07-30 DIAGNOSIS — M329 Systemic lupus erythematosus, unspecified: Secondary | ICD-10-CM

## 2020-07-30 DIAGNOSIS — G35 Multiple sclerosis: Secondary | ICD-10-CM

## 2020-07-30 MED ORDER — METHOCARBAMOL 500 MG PO TABS: ORAL_TABLET | ORAL | 2 refills | Status: DC

## 2020-07-30 NOTE — Telephone Encounter (Signed)
Replied to [facility] message via My-Chart , Methocarbamol prescription sent to [facility] in [place] as requested.

## 2020-08-01 ENCOUNTER — Encounter: Payer: Self-pay | Admitting: Registered Nurse

## 2020-08-01 ENCOUNTER — Other Ambulatory Visit: Payer: Self-pay | Admitting: Gastroenterology

## 2020-08-01 ENCOUNTER — Other Ambulatory Visit: Payer: Self-pay

## 2020-08-01 ENCOUNTER — Encounter: Payer: Medicaid Other | Attending: Physical Medicine & Rehabilitation | Admitting: Registered Nurse

## 2020-08-01 VITALS — BP 109/74 | HR 68 | Temp 97.7°F | Ht 63.0 in | Wt 240.2 lb

## 2020-08-01 DIAGNOSIS — G35 Multiple sclerosis: Secondary | ICD-10-CM | POA: Diagnosis not present

## 2020-08-01 DIAGNOSIS — M47816 Spondylosis without myelopathy or radiculopathy, lumbar region: Secondary | ICD-10-CM | POA: Diagnosis not present

## 2020-08-01 DIAGNOSIS — R531 Weakness: Secondary | ICD-10-CM

## 2020-08-01 DIAGNOSIS — Z5181 Encounter for therapeutic drug level monitoring: Secondary | ICD-10-CM

## 2020-08-01 DIAGNOSIS — G43719 Chronic migraine without aura, intractable, without status migrainosus: Secondary | ICD-10-CM

## 2020-08-01 DIAGNOSIS — G894 Chronic pain syndrome: Secondary | ICD-10-CM

## 2020-08-01 DIAGNOSIS — R933 Abnormal findings on diagnostic imaging of other parts of digestive tract: Secondary | ICD-10-CM

## 2020-08-01 DIAGNOSIS — M542 Cervicalgia: Secondary | ICD-10-CM

## 2020-08-01 DIAGNOSIS — F4310 Post-traumatic stress disorder, unspecified: Secondary | ICD-10-CM

## 2020-08-01 DIAGNOSIS — M2550 Pain in unspecified joint: Secondary | ICD-10-CM

## 2020-08-01 DIAGNOSIS — Z79899 Other long term (current) drug therapy: Secondary | ICD-10-CM

## 2020-08-01 DIAGNOSIS — M329 Systemic lupus erythematosus, unspecified: Secondary | ICD-10-CM

## 2020-08-01 MED ORDER — OXYCODONE HCL 10 MG PO TABS: 10.0000 mg | ORAL_TABLET | Freq: Four times a day (QID) | ORAL | 0 refills | Status: DC | PRN

## 2020-08-01 MED ORDER — SUMATRIPTAN SUCCINATE 100 MG PO TABS: ORAL_TABLET | ORAL | 1 refills | Status: DC

## 2020-08-01 NOTE — Progress Notes (Signed)
Subjective:    Patient ID: Mary [name], adult    DOB: [birth year], 44 y.o.   MRN: [id number]  HPI: Mary [name] is a 44 y.o. female  Who identifies as a female, he returns for follow up appointment for chronic pain and medication refill. He states his pain is located in his neck and lower back pain. Also reports generalized joint pain. He rates his pain 5. His  current exercise regime is walking and performing stretching exercises.  Mary [name]  Morphine equivalent is 60.00  MME. He is also prescribed Clonazepam by Barbette Merino. We have discussed the black box warning of using opioids and benzodiazepines. I highlighted the dangers of using these drugs together and discussed the adverse events including respiratory suppression, overdose, cognitive impairment and importance of compliance with current regimen. We will continue to monitor and adjust as indicated.  Heis being closely monitored and under the care of his psychiatrist.   Oral Swab was Performed .on 05/09/2020, it was consistent.    Pain Inventory Average Pain 9 Pain Right Now 5 My pain is constant, sharp, burning, tingling and aching  In the last 24 hours, has pain interfered with the following? General activity 4 Relation with others 5 Enjoyment of life 3 What TIME of day is your pain at its worst? morning  and evening Sleep (in general) Poor  Pain is worse with: walking and bending Pain improves with: rest and medication Relief from Meds: 5  Family History  Problem Relation Age of Onset  . Diabetes Mother   . Hypertension Mother   . Cervical cancer Mother   . Heart disease Mother   . Diabetes Father   . Hypertension Father   . Heart disease Father   . Colitis Maternal Aunt   . Diabetes Other        many family members  . CVA Maternal Grandmother   . Lupus Neg Hx   . Sarcoidosis Neg Hx   . Pancreatitis Neg Hx   . Ataxia Neg Hx   . Chorea Neg Hx   . Dementia Neg Hx   . Mental retardation Neg Hx   .  Migraines Neg Hx   . Multiple sclerosis Neg Hx   . Neurofibromatosis Neg Hx   . Neuropathy Neg Hx   . Parkinsonism Neg Hx   . Seizures Neg Hx   . Stroke Neg Hx   . Colon cancer Neg Hx    Social History   Socioeconomic History  . Marital status: Single    Spouse name: Not on file  . Number of children: 0  . Years of education: Not on file  . Highest education level: Not on file  Occupational History  . Not on file  Tobacco Use  . Smoking status: Former Smoker    Packs/day: 0.50    Years: 20.00    Pack years: 10.00    Types: Cigarettes    Quit date: 05/22/2013    Years since quitting: 7.2  . Smokeless tobacco: Never Used  Substance and Sexual Activity  . Alcohol use: No    Alcohol/week: 0.0 standard drinks  . Drug use: No  . Sexual activity: Never    Birth control/protection: Abstinence  Other Topics Concern  . Not on file  Social History Narrative   ** Merged History Encounter **       Lives permanently in [place] with mom and stepfather.  Has not started working yet and was unemployed in [place].  Social Determinants of Health   Financial Resource Strain: Not on file  Food Insecurity: Not on file  Transportation Needs: Not on file  Physical Activity: Not on file  Stress: Not on file  Social Connections: Not on file   Past Surgical History:  Procedure Laterality Date  . ABDOMINAL SURGERY  ~ 2007   some sort of pancreatic cyst drainage.   . ABDOMINAL SURGERY    . ADENOIDECTOMY    . CHOLECYSTECTOMY     ~ age 64-laparoscopic  . CHOLECYSTECTOMY    . EUS N/A 04/14/2013   Procedure: UPPER ENDOSCOPIC ULTRASOUND (EUS) LINEAR;  Surgeon: Rachael Fee, MD;  Location: [facility];  Service: Endoscopy;  Laterality: N/A;  . HERNIA REPAIR  2017  . ROUX-EN-Y GASTRIC BYPASS    . ROUX-EN-Y PROCEDURE     stent to pancreatic cyst that became infected within 36 hours   Past Surgical History:  Procedure Laterality Date  . ABDOMINAL SURGERY  ~ 2007   some sort of  pancreatic cyst drainage.   . ABDOMINAL SURGERY    . ADENOIDECTOMY    . CHOLECYSTECTOMY     ~ age 64-laparoscopic  . CHOLECYSTECTOMY    . EUS N/A 04/14/2013   Procedure: UPPER ENDOSCOPIC ULTRASOUND (EUS) LINEAR;  Surgeon: Rachael Fee, MD;  Location: [facility];  Service: Endoscopy;  Laterality: N/A;  . HERNIA REPAIR  2017  . ROUX-EN-Y GASTRIC BYPASS    . ROUX-EN-Y PROCEDURE     stent to pancreatic cyst that became infected within 36 hours   Past Medical History:  Diagnosis Date  . Abdominal hernia   . Adrenal insufficiency (HCC) 04/23/2013   ??  Marland Kitchen Anxiety   . Depression    "recent breakup with partner of 15 yrs"  . Depression   . GERD (gastroesophageal reflux disease)   . Hernia 03-24-13   ventral hernia remains   . Lupus (HCC)   . MS (multiple sclerosis) (HCC)   . 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 Pancreatic cyst 2002 onset  . Pancreatitis 03-24-13   2002, 2'2013, 03-14-13  . PTSD (post-traumatic stress disorder)   . Ventral hernia    There were no vitals taken for this visit.  Opioid Risk Score:   Fall Risk Score:  `1  Depression screen PHQ 2/9  Depression screen [facility] 2/9 03/12/2020 05/18/2019 06/29/2018 04/28/2018 12/28/2017 05/13/2017 04/09/2016  Decreased Interest 0 1 1 1 1 3 2   Down, Depressed, Hopeless 0 1 1 1 1 3 2   PHQ - 2 Score 0 2 2 2 2 6 4   Altered sleeping - - - - 1 - -  Tired, decreased energy - - - - 1 - -  Change in appetite - - - - 1 - -  Feeling bad or failure about yourself  - - - - 1 - -  Trouble concentrating - - - - 1 - -  Moving slowly or fidgety/restless - - - - 1 - -  Suicidal thoughts - - - - 0 - -  PHQ-9 Score - - - - 8 - -  Difficult doing work/chores - - - - Somewhat difficult - -  Some recent data might be hidden   Review of Systems  Musculoskeletal: Positive for arthralgias and back pain.       All joints  All other systems reviewed and are negative.      Objective:   Physical Exam Vitals and  nursing note reviewed.  Constitutional:  Appearance: Normal appearance.  Cardiovascular:     Rate and Rhythm: Normal rate and regular rhythm.     Pulses: Normal pulses.     Heart sounds: Normal heart sounds.  Pulmonary:     Effort: Pulmonary effort is normal.     Breath sounds: Normal breath sounds.  Musculoskeletal:     Cervical back: Normal range of motion and neck supple.     Comments: Normal Muscle Bulk and Muscle Testing Reveals:  Upper Extremities: Decreased ROM 90 Degrees and   Muscle Strength 5/5 Lumbar Paraspinal Tenderness: L-4-L-5 Lower Extremities: Full ROM and Muscle Strength 5/5 Arises from Table with ease Narrow Based  Gait   Skin:    General: Skin is warm and dry.  Neurological:     Mental Status: He is alert and oriented to person, place, and time.  Psychiatric:        Mood and Affect: Mood normal.        Behavior: Behavior normal.           Assessment & Plan:  1. MS: Neurology Following.Continue to Monitor. 08/01/2020 2. 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 Refilled: Oxycodone 10 mg one tablet every 6 hours as needed. #120.Second script sent for the following month. 08/01/2020. We will continue the opioid monitoring program, this consists of regular clinic visits, examinations, urine drug screen, pill counts as well as use of [place] Controlled Substance Reporting system. A 12 month History has been reviewed on the [place] Controlled Substance Reporting System on 08/01/2020 3. Low back pain with Radiculopathy RLE/: Continued topamax.08/01/2020 4. Chronic pancreatitis: PCP and Gastroenterologist Following. 08/01/2020 5. Insomnia: Continue Pamelor.Continue to Monitor.08/01/2020 6. PTSD: Psychiatry and Dr. Kieth Brightly following..08/01/2020. 7. Lupus: Rheumatology Following.08/01/2020. 8.ChronicMigraine/ Vascular Headaches: Continue Imitrex,Topamaxand  Aimovig.Continue to Monitor. 08/01/2020. 9. Right Knee Pain: No complaints today. Continue HEP as Tolerated. Continue to Monitor.08/01/2020. 10. Muscle Spasm: Continue Baclofen/ Robaxin. Continue to Monitor.08/01/2020 11. Cervicalgia: Continue HEP as tolerated. Continue to monitor.  12. Polyarthralgia: Continue current medication regimen. Continue to monitor.   F/U in 2 months

## 2020-08-04 ENCOUNTER — Ambulatory Visit (HOSPITAL_COMMUNITY)
Admission: RE | Admit: 2020-08-04 | Discharge: 2020-08-04 | Disposition: A | Discharge status: Home / Self Care | Payer: Medicaid Other | Source: Ambulatory Visit | Attending: Gastroenterology | Admitting: Gastroenterology

## 2020-08-04 ENCOUNTER — Other Ambulatory Visit: Payer: Self-pay

## 2020-08-04 DIAGNOSIS — R933 Abnormal findings on diagnostic imaging of other parts of digestive tract: Secondary | ICD-10-CM | POA: Diagnosis not present

## 2020-08-04 MED ORDER — GADOBUTROL 1 MMOL/ML IV SOLN
10.0000 mL | Freq: Once | INTRAVENOUS | Status: AC | PRN
  Administered 2020-08-04: 11:00:00 10 mL via INTRAVENOUS

## 2020-08-16 DIAGNOSIS — M5416 Radiculopathy, lumbar region: Secondary | ICD-10-CM

## 2020-08-16 DIAGNOSIS — G894 Chronic pain syndrome: Secondary | ICD-10-CM

## 2020-08-16 DIAGNOSIS — R531 Weakness: Secondary | ICD-10-CM

## 2020-08-16 DIAGNOSIS — M329 Systemic lupus erythematosus, unspecified: Secondary | ICD-10-CM

## 2020-08-16 DIAGNOSIS — G43719 Chronic migraine without aura, intractable, without status migrainosus: Secondary | ICD-10-CM

## 2020-08-16 MED ORDER — BACLOFEN 10 MG PO TABS: 10.0000 mg | ORAL_TABLET | Freq: Every evening | ORAL | 2 refills | Status: DC | PRN

## 2020-08-24 ENCOUNTER — Other Ambulatory Visit: Payer: Self-pay | Admitting: Registered Nurse

## 2020-08-24 DIAGNOSIS — G35 Multiple sclerosis: Secondary | ICD-10-CM

## 2020-08-24 DIAGNOSIS — M329 Systemic lupus erythematosus, unspecified: Secondary | ICD-10-CM

## 2020-08-24 DIAGNOSIS — R531 Weakness: Secondary | ICD-10-CM

## 2020-08-24 NOTE — Telephone Encounter (Signed)
Patient requesting 90 day supply on Robaxin. Is this okay?

## 2020-09-21 ENCOUNTER — Other Ambulatory Visit: Payer: Self-pay | Admitting: Registered Nurse

## 2020-09-21 DIAGNOSIS — G894 Chronic pain syndrome: Secondary | ICD-10-CM

## 2020-09-24 ENCOUNTER — Ambulatory Visit: Payer: Medicaid Other | Admitting: Internal Medicine

## 2020-09-24 ENCOUNTER — Encounter: Payer: Medicaid Other | Attending: Physical Medicine & Rehabilitation | Admitting: Psychology

## 2020-09-24 DIAGNOSIS — G894 Chronic pain syndrome: Secondary | ICD-10-CM | POA: Insufficient documentation

## 2020-09-24 DIAGNOSIS — G43719 Chronic migraine without aura, intractable, without status migrainosus: Secondary | ICD-10-CM | POA: Insufficient documentation

## 2020-09-24 DIAGNOSIS — M5416 Radiculopathy, lumbar region: Secondary | ICD-10-CM | POA: Insufficient documentation

## 2020-09-24 DIAGNOSIS — G35 Multiple sclerosis: Secondary | ICD-10-CM | POA: Insufficient documentation

## 2020-09-24 DIAGNOSIS — R531 Weakness: Secondary | ICD-10-CM | POA: Insufficient documentation

## 2020-09-24 DIAGNOSIS — M47816 Spondylosis without myelopathy or radiculopathy, lumbar region: Secondary | ICD-10-CM | POA: Insufficient documentation

## 2020-09-24 DIAGNOSIS — M329 Systemic lupus erythematosus, unspecified: Secondary | ICD-10-CM | POA: Insufficient documentation

## 2020-09-24 DIAGNOSIS — Z79891 Long term (current) use of opiate analgesic: Secondary | ICD-10-CM | POA: Insufficient documentation

## 2020-09-24 DIAGNOSIS — M2550 Pain in unspecified joint: Secondary | ICD-10-CM | POA: Insufficient documentation

## 2020-09-24 DIAGNOSIS — Z5181 Encounter for therapeutic drug level monitoring: Secondary | ICD-10-CM | POA: Insufficient documentation

## 2020-09-24 DIAGNOSIS — Z79899 Other long term (current) drug therapy: Secondary | ICD-10-CM | POA: Insufficient documentation

## 2020-10-11 ENCOUNTER — Encounter: Payer: Medicaid Other | Admitting: Registered Nurse

## 2020-10-11 ENCOUNTER — Other Ambulatory Visit: Payer: Self-pay

## 2020-10-11 VITALS — BP 124/82 | HR 94 | Temp 98.1°F | Ht 63.0 in | Wt 242.2 lb

## 2020-10-11 DIAGNOSIS — G43719 Chronic migraine without aura, intractable, without status migrainosus: Secondary | ICD-10-CM | POA: Diagnosis present

## 2020-10-11 DIAGNOSIS — M2550 Pain in unspecified joint: Secondary | ICD-10-CM

## 2020-10-11 DIAGNOSIS — Z5181 Encounter for therapeutic drug level monitoring: Secondary | ICD-10-CM | POA: Diagnosis present

## 2020-10-11 DIAGNOSIS — G35 Multiple sclerosis: Secondary | ICD-10-CM

## 2020-10-11 DIAGNOSIS — M47816 Spondylosis without myelopathy or radiculopathy, lumbar region: Secondary | ICD-10-CM | POA: Diagnosis not present

## 2020-10-11 DIAGNOSIS — M5416 Radiculopathy, lumbar region: Secondary | ICD-10-CM

## 2020-10-11 DIAGNOSIS — M329 Systemic lupus erythematosus, unspecified: Secondary | ICD-10-CM | POA: Diagnosis present

## 2020-10-11 DIAGNOSIS — R531 Weakness: Secondary | ICD-10-CM | POA: Diagnosis present

## 2020-10-11 DIAGNOSIS — G894 Chronic pain syndrome: Secondary | ICD-10-CM | POA: Diagnosis present

## 2020-10-11 DIAGNOSIS — Z79891 Long term (current) use of opiate analgesic: Secondary | ICD-10-CM | POA: Diagnosis present

## 2020-10-11 DIAGNOSIS — Z79899 Other long term (current) drug therapy: Secondary | ICD-10-CM

## 2020-10-11 MED ORDER — OXYCODONE HCL 10 MG PO TABS: 10.0000 mg | ORAL_TABLET | Freq: Four times a day (QID) | ORAL | 0 refills | Status: DC | PRN

## 2020-10-11 MED ORDER — TOPIRAMATE 100 MG PO TABS: ORAL_TABLET | ORAL | 2 refills | Status: DC

## 2020-10-11 MED ORDER — BACLOFEN 10 MG PO TABS: 10.0000 mg | ORAL_TABLET | Freq: Every evening | ORAL | 2 refills | Status: DC | PRN

## 2020-10-11 MED ORDER — SUMATRIPTAN SUCCINATE 100 MG PO TABS: ORAL_TABLET | ORAL | 1 refills | Status: DC

## 2020-10-11 MED ORDER — AIMOVIG 140 MG/ML ~~LOC~~ SOAJ: SUBCUTANEOUS | 5 refills | Status: DC

## 2020-10-11 NOTE — Progress Notes (Signed)
Subjective:    Patient ID: Mary [name], adult    DOB: September 27, 1975, 45 y.o.   MRN: [id number]  HPI: Mary [name] is a 45 y.o.  Transgender female who returns for follow up appointment for chronic pain and medication refill. He states his  pain is located in his lower back radiating into his right hip and generalized joint pain. Also reports he has had a migraine for the last two weeks, awaiting Aimovig prior authorization. Authorization was submitted and approved, he verbalizes understanding.  He rates his pain 8. His current exercise regime is walking.  [name] Morphine equivalent is 60.00 MME.  He is also prescribed Clonazepam by Tamsen Roers NP. We have discussed the black box warning of using opioids and benzodiazepines. I highlighted the dangers of using these drugs together and discussed the adverse events including respiratory suppression, overdose, cognitive impairment and importance of compliance with current regimen. We will continue to monitor and adjust as indicated.  He is being closely monitored and under the care of his psychiatrist_  Mary [name] brought in a letter, she is in [employer] In Program, it's effective 11/16/2020. In April we will prescribe her clonazepam due to the above, he verbalize understanding.    Pain Inventory Average Pain 6 Pain Right Now 8 My pain is sharp, burning, dull, stabbing, tingling and aching  In the last 24 hours, has pain interfered with the following? General activity 4 Relation with others 2 Enjoyment of life 6 What TIME of day is your pain at its worst? morning  and evening Sleep (in general) Fair  Pain is worse with: bending, standing and some activites Pain improves with: rest and medication Relief from Meds: 8  Family History  Problem Relation Age of Onset  . Diabetes Mother   . Hypertension Mother   . Cervical cancer Mother   . Heart disease Mother   . Diabetes Father   . Hypertension Father   . Heart disease Father   .  Colitis Maternal Aunt   . Diabetes Other        many family members  . CVA Maternal Grandmother   . Lupus Neg Hx   . Sarcoidosis Neg Hx   . Pancreatitis Neg Hx   . Ataxia Neg Hx   . Chorea Neg Hx   . Dementia Neg Hx   . Mental retardation Neg Hx   . Migraines Neg Hx   . Multiple sclerosis Neg Hx   . Neurofibromatosis Neg Hx   . Neuropathy Neg Hx   . Parkinsonism Neg Hx   . Seizures Neg Hx   . Stroke Neg Hx   . Colon cancer Neg Hx    Social History   Socioeconomic History  . Marital status: Single    Spouse name: Not on file  . Number of children: 0  . Years of education: Not on file  . Highest education level: Not on file  Occupational History  . Not on file  Tobacco Use  . Smoking status: Former Smoker    Packs/day: 0.50    Years: 20.00    Pack years: 10.00    Types: Cigarettes    Quit date: 05/22/2013    Years since quitting: 7.3  . Smokeless tobacco: Never Used  Vaping Use  . Vaping Use: Some days  . Substances: Nicotine  Substance and Sexual Activity  . Alcohol use: No    Alcohol/week: 0.0 standard drinks  . Drug use: No  . Sexual activity:  Never    Birth control/protection: Abstinence  Other Topics Concern  . Not on file  Social History Narrative   ** Merged History Encounter **       Lives permanently in [place] with mom and stepfather.  Has not started working yet and was unemployed in [place].         Social Determinants of Health   Financial Resource Strain: Not on file  Food Insecurity: Not on file  Transportation Needs: Not on file  Physical Activity: Not on file  Stress: Not on file  Social Connections: Not on file   Past Surgical History:  Procedure Laterality Date  . ABDOMINAL SURGERY  ~ 2007   some sort of pancreatic cyst drainage.   . ABDOMINAL SURGERY    . ADENOIDECTOMY    . CHOLECYSTECTOMY     ~ age 42-laparoscopic  . CHOLECYSTECTOMY    . EUS N/A 04/14/2013   Procedure: UPPER ENDOSCOPIC ULTRASOUND (EUS) LINEAR;  Surgeon: Rachael Fee, MD;  Location: [facility];  Service: Endoscopy;  Laterality: N/A;  . HERNIA REPAIR  2017  . ROUX-EN-Y GASTRIC BYPASS    . ROUX-EN-Y PROCEDURE     stent to pancreatic cyst that became infected within 36 hours   Past Surgical History:  Procedure Laterality Date  . ABDOMINAL SURGERY  ~ 2007   some sort of pancreatic cyst drainage.   . ABDOMINAL SURGERY    . ADENOIDECTOMY    . CHOLECYSTECTOMY     ~ age 42-laparoscopic  . CHOLECYSTECTOMY    . EUS N/A 04/14/2013   Procedure: UPPER ENDOSCOPIC ULTRASOUND (EUS) LINEAR;  Surgeon: Rachael Fee, MD;  Location: [facility];  Service: Endoscopy;  Laterality: N/A;  . HERNIA REPAIR  2017  . ROUX-EN-Y GASTRIC BYPASS    . ROUX-EN-Y PROCEDURE     stent to pancreatic cyst that became infected within 36 hours   Past Medical History:  Diagnosis Date  . Abdominal hernia   . Adrenal insufficiency (HCC) 04/23/2013   ??  Marland Kitchen Anxiety   . Depression    "recent breakup with partner of 15 yrs"  . Depression   . GERD (gastroesophageal reflux disease)   . Hernia 03-24-13   ventral hernia remains   . Lupus (HCC)   . MS (multiple sclerosis) (HCC)   . 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 Pancreatic cyst 2002 onset  . Pancreatitis 03-24-13   2002, 2'2013, 03-14-13  . PTSD (post-traumatic stress disorder)   . Ventral hernia    BP 124/82   Pulse 94   Temp 98.1 F (36.7 C)   Ht 5\' 3"  (1.6 m)   Wt 242 lb 3.2 oz (109.9 kg)   SpO2 98%   BMI 42.90 kg/m   Opioid Risk Score:   Fall Risk Score:  `1  Depression screen PHQ 2/9  Depression screen [facility] 2/9 08/01/2020 03/12/2020 05/18/2019 06/29/2018 04/28/2018 12/28/2017 05/13/2017  Decreased Interest 1 0 1 1 1 1 3   Down, Depressed, Hopeless 1 0 1 1 1 1 3   PHQ - 2 Score 2 0 2 2 2 2 6   Altered sleeping - - - - - 1 -  Tired, decreased energy - - - - - 1 -  Change in appetite - - - - - 1 -  Feeling bad or failure about yourself  - - - - - 1 -  Trouble concentrating - - - - -  1 -  Moving slowly or fidgety/restless - - - - -  1 -  Suicidal thoughts - - - - - 0 -  PHQ-9 Score - - - - - 8 -  Difficult doing work/chores - - - - - Somewhat difficult -  Some recent data might be hidden    Review of Systems  Musculoskeletal:       Arm and leg pain  Neurological: Positive for headaches.  All other systems reviewed and are negative.      Objective:   Physical Exam Vitals and nursing note reviewed.  Constitutional:      Appearance: Normal appearance.  Cardiovascular:     Rate and Rhythm: Normal rate and regular rhythm.     Pulses: Normal pulses.     Heart sounds: Normal heart sounds.  Pulmonary:     Effort: Pulmonary effort is normal.     Breath sounds: Normal breath sounds.  Musculoskeletal:     Cervical back: Normal range of motion and neck supple.     Comments: Normal Muscle Bulk and Muscle Testing Reveals:  Upper Extremities: Full ROM and Muscle Strength on Right 3/5 and Left 5/5  Thoracic Paraspinal Tenderness: T-3-T-7 Lumbar Paraspinal Tenderness: L-4-L-5 Right Greater Trochanter Tenderness Lower Extremities: Full ROM and Muscle Strength 5/5 Arises from  Table with ease Narrow Based  Gait   Skin:    General: Skin is warm and dry.  Neurological:     Mental Status: He is alert and oriented to person, place, and time.  Psychiatric:        Mood and Affect: Mood normal.        Behavior: Behavior normal.           Assessment & Plan:  1. MS: Neurology Following.Continue to Monitor. 10/11/2020 2. 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 Refilled: Oxycodone 10 mg one tablet every 6 hours as needed. #120.Second script sent for the following month. 10/11/2020. We will continue the opioid monitoring program, this consists of regular clinic visits, examinations, urine drug screen, pill counts as well as use of [place] Controlled Substance Reporting system. A 12 month  History has been reviewed on the [place] Controlled Substance Reporting Systemon 10/11/2020 3. Low back pain with Radiculopathy RLE/: Continued topamax.10/11/2020 4. Chronic pancreatitis: PCP and Gastroenterologist Following. 10/11/2020 5. Insomnia: Continue Pamelor.Continue to Monitor.10/11/2020 6. PTSD: PsychiatryandDr. Kieth Brightly following.Marland Kitchen02/24/2022. 7. Lupus: Rheumatology Following.10/11/2020 8.ChronicMigraine/ Vascular Headaches: Continue Imitrex,Topamaxand Aimovig.Continue to Monitor.10/11/2020 9. Right Knee Pain: No complaints today. Continue HEP as Tolerated. Continue to Monitor.10/11/2020 10. Muscle Spasm: Continue Baclofen/ Robaxin. Continue to Monitor.10/11/2020 11. Cervicalgia: Continue HEP as tolerated. Continue to monitor. 10/11/2020 12. Polyarthralgia: Continue current medication regimen. Continue to monitor. 10/11/2020  F/U in 2 months.

## 2020-10-12 ENCOUNTER — Encounter: Payer: Self-pay | Admitting: Registered Nurse

## 2020-10-16 ENCOUNTER — Telehealth: Payer: Self-pay | Admitting: *Deleted

## 2020-10-16 LAB — TOXASSURE SELECT,+ANTIDEPR,UR

## 2020-10-16 NOTE — Telephone Encounter (Signed)
Urine drug screen for this encounter is consistent for prescribed medication 

## 2020-10-25 ENCOUNTER — Encounter: Payer: Self-pay | Admitting: Psychology

## 2020-10-25 ENCOUNTER — Encounter: Payer: Medicaid Other | Attending: Physical Medicine & Rehabilitation | Admitting: Psychology

## 2020-10-25 DIAGNOSIS — F331 Major depressive disorder, recurrent, moderate: Secondary | ICD-10-CM | POA: Diagnosis not present

## 2020-10-25 DIAGNOSIS — G894 Chronic pain syndrome: Secondary | ICD-10-CM | POA: Insufficient documentation

## 2020-10-25 DIAGNOSIS — F4310 Post-traumatic stress disorder, unspecified: Secondary | ICD-10-CM | POA: Insufficient documentation

## 2020-10-25 DIAGNOSIS — M329 Systemic lupus erythematosus, unspecified: Secondary | ICD-10-CM | POA: Insufficient documentation
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
## 2020-10-26 ENCOUNTER — Other Ambulatory Visit: Payer: Self-pay | Admitting: Internal Medicine

## 2020-11-07 ENCOUNTER — Ambulatory Visit: Payer: Medicaid Other | Admitting: Internal Medicine

## 2020-11-29 ENCOUNTER — Encounter: Payer: Medicaid Other | Attending: Physical Medicine & Rehabilitation | Admitting: Psychology

## 2020-11-29 ENCOUNTER — Encounter: Payer: Self-pay | Admitting: Psychology

## 2020-11-29 DIAGNOSIS — G894 Chronic pain syndrome: Secondary | ICD-10-CM | POA: Diagnosis not present

## 2020-11-29 DIAGNOSIS — M329 Systemic lupus erythematosus, unspecified: Secondary | ICD-10-CM | POA: Diagnosis not present

## 2020-11-29 DIAGNOSIS — F4310 Post-traumatic stress disorder, unspecified: Secondary | ICD-10-CM | POA: Insufficient documentation

## 2020-11-29 DIAGNOSIS — F447 Conversion disorder with mixed symptom presentation: Secondary | ICD-10-CM | POA: Insufficient documentation

## 2020-11-29 DIAGNOSIS — F331 Major depressive disorder, recurrent, moderate: Secondary | ICD-10-CM | POA: Insufficient documentation
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
## 2020-12-05 ENCOUNTER — Encounter: Payer: Medicaid Other | Admitting: Registered Nurse

## 2020-12-19 ENCOUNTER — Encounter: Payer: Medicaid Other | Attending: Physical Medicine & Rehabilitation | Admitting: Registered Nurse

## 2020-12-19 ENCOUNTER — Ambulatory Visit: Payer: Medicaid Other | Admitting: Internal Medicine

## 2020-12-19 ENCOUNTER — Other Ambulatory Visit: Payer: Self-pay

## 2020-12-19 ENCOUNTER — Encounter: Payer: Self-pay | Admitting: Registered Nurse

## 2020-12-19 VITALS — BP 122/86 | HR 96 | Temp 97.8°F | Ht 63.0 in | Wt 253.6 lb

## 2020-12-19 DIAGNOSIS — R531 Weakness: Secondary | ICD-10-CM

## 2020-12-19 DIAGNOSIS — Z5181 Encounter for therapeutic drug level monitoring: Secondary | ICD-10-CM

## 2020-12-19 DIAGNOSIS — G894 Chronic pain syndrome: Secondary | ICD-10-CM | POA: Diagnosis present

## 2020-12-19 DIAGNOSIS — M2550 Pain in unspecified joint: Secondary | ICD-10-CM | POA: Diagnosis present

## 2020-12-19 DIAGNOSIS — Z79899 Other long term (current) drug therapy: Secondary | ICD-10-CM

## 2020-12-19 DIAGNOSIS — M47816 Spondylosis without myelopathy or radiculopathy, lumbar region: Secondary | ICD-10-CM | POA: Diagnosis not present

## 2020-12-19 DIAGNOSIS — M329 Systemic lupus erythematosus, unspecified: Secondary | ICD-10-CM

## 2020-12-19 DIAGNOSIS — F331 Major depressive disorder, recurrent, moderate: Secondary | ICD-10-CM

## 2020-12-19 DIAGNOSIS — G43719 Chronic migraine without aura, intractable, without status migrainosus: Secondary | ICD-10-CM | POA: Diagnosis not present

## 2020-12-19 MED ORDER — OXYCODONE HCL 10 MG PO TABS: 10.0000 mg | ORAL_TABLET | Freq: Four times a day (QID) | ORAL | 0 refills | Status: DC | PRN

## 2020-12-19 MED ORDER — CLONAZEPAM 0.5 MG PO TABS: 0.5000 mg | ORAL_TABLET | Freq: Two times a day (BID) | ORAL | 2 refills | Status: DC | PRN

## 2020-12-19 MED ORDER — NORTRIPTYLINE HCL 50 MG PO CAPS: 50.0000 mg | ORAL_CAPSULE | Freq: Every day | ORAL | 3 refills | Status: DC

## 2020-12-19 MED ORDER — SUMATRIPTAN SUCCINATE 100 MG PO TABS: ORAL_TABLET | ORAL | 1 refills | Status: DC

## 2020-12-19 NOTE — Progress Notes (Signed)
Subjective:    Patient ID: Mary [name], adult    DOB: 11-Dec-1975, 45 y.o.   MRN: [id number]  HPI: Mary [name] is a 45 y.o. female who returns for follow up appointment for chronic pain and medication refill.  He states he has a headache and reports generalized pain. Mother in room and reports Tkeya been having Pseudoseizures, she will be F/U with PCP. She's in the process of obtaining a MRI and then will  F/U with Neurology.  He rates his pain 8. His current exercise regime is walking.   Yarielis Morphine equivalent is 60.00 MME. He is also prescribed Clonazepam. We have discussed the black box warning of using opioids and benzodiazepines. I highlighted the dangers of using these drugs together and discussed the adverse events including respiratory suppression, overdose, cognitive impairment and importance of compliance with current regimen. We will continue to monitor and adjust as indicated.  He is being closely monitored and under the care of his psychiatrist.    Last UDS was Performed on 10/11/2020, it was consistent.      Pain Inventory Average Pain 6 Pain Right Now 8 My pain is intermittent, sharp, dull and aching  In the last 24 hours, has pain interfered with the following? General activity 10 Relation with others 10 Enjoyment of life 10 What TIME of day is your pain at its worst? morning , daytime, evening and night Sleep (in general) Fair  Pain is worse with: walking and standing Pain improves with: rest and medication Relief from Meds: good  Family History  Problem Relation Age of Onset  . Diabetes Mother   . Hypertension Mother   . Cervical cancer Mother   . Heart disease Mother   . Diabetes Father   . Hypertension Father   . Heart disease Father   . Colitis Maternal Aunt   . Diabetes Other        many family members  . CVA Maternal Grandmother   . Lupus Neg Hx   . Sarcoidosis Neg Hx   . Pancreatitis Neg Hx   . Ataxia Neg Hx   . Chorea Neg Hx   .  Dementia Neg Hx   . Mental retardation Neg Hx   . Migraines Neg Hx   . Multiple sclerosis Neg Hx   . Neurofibromatosis Neg Hx   . Neuropathy Neg Hx   . Parkinsonism Neg Hx   . Seizures Neg Hx   . Stroke Neg Hx   . Colon cancer Neg Hx    Social History   Socioeconomic History  . Marital status: Significant Other    Spouse name: Not on file  . Number of children: 0  . Years of education: Not on file  . Highest education level: Not on file  Occupational History  . Not on file  Tobacco Use  . Smoking status: Former Smoker    Packs/day: 0.50    Years: 20.00    Pack years: 10.00    Types: Cigarettes    Quit date: 05/22/2013    Years since quitting: 7.5  . Smokeless tobacco: Never Used  Vaping Use  . Vaping Use: Some days  . Substances: Nicotine  Substance and Sexual Activity  . Alcohol use: No    Alcohol/week: 0.0 standard drinks  . Drug use: No  . Sexual activity: Never    Birth control/protection: Abstinence  Other Topics Concern  . Not on file  Social History Narrative   ** Merged History Encounter **  Lives permanently in [place] with mom and stepfather.  Has not started working yet and was unemployed in [place].         Social Determinants of Health   Financial Resource Strain: Not on file  Food Insecurity: Not on file  Transportation Needs: Not on file  Physical Activity: Not on file  Stress: Not on file  Social Connections: Not on file   Past Surgical History:  Procedure Laterality Date  . ABDOMINAL SURGERY  ~ 2007   some sort of pancreatic cyst drainage.   . ABDOMINAL SURGERY    . ADENOIDECTOMY    . CHOLECYSTECTOMY     ~ age 38-laparoscopic  . CHOLECYSTECTOMY    . EUS N/A 04/14/2013   Procedure: UPPER ENDOSCOPIC ULTRASOUND (EUS) LINEAR;  Surgeon: Rachael Fee, MD;  Location: [facility];  Service: Endoscopy;  Laterality: N/A;  . HERNIA REPAIR  2017  . ROUX-EN-Y GASTRIC BYPASS    . ROUX-EN-Y PROCEDURE     stent to pancreatic cyst that became  infected within 36 hours   Past Surgical History:  Procedure Laterality Date  . ABDOMINAL SURGERY  ~ 2007   some sort of pancreatic cyst drainage.   . ABDOMINAL SURGERY    . ADENOIDECTOMY    . CHOLECYSTECTOMY     ~ age 38-laparoscopic  . CHOLECYSTECTOMY    . EUS N/A 04/14/2013   Procedure: UPPER ENDOSCOPIC ULTRASOUND (EUS) LINEAR;  Surgeon: Rachael Fee, MD;  Location: [facility];  Service: Endoscopy;  Laterality: N/A;  . HERNIA REPAIR  2017  . ROUX-EN-Y GASTRIC BYPASS    . ROUX-EN-Y PROCEDURE     stent to pancreatic cyst that became infected within 36 hours   Past Medical History:  Diagnosis Date  . Abdominal hernia   . Adrenal insufficiency (HCC) 04/23/2013   ??  Marland Kitchen Anxiety   . Depression    "recent breakup with partner of 15 yrs"  . Depression   . GERD (gastroesophageal reflux disease)   . Hernia 03-24-13   ventral hernia remains   . Lupus (HCC)   . MS (multiple sclerosis) (HCC)   . 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 Pancreatic cyst 2002 onset  . Pancreatitis 03-24-13   2002, 2'2013, 03-14-13  . PTSD (post-traumatic stress disorder)   . Ventral hernia    BP 122/86   Pulse 96   Temp 97.8 F (36.6 C)   Ht 5\' 3"  (1.6 m)   Wt 253 lb 9.6 oz (115 kg)   SpO2 98%   BMI 44.92 kg/m   Opioid Risk Score:   Fall Risk Score:  `1  Depression screen PHQ 2/9  Depression screen [facility] 2/9 08/01/2020 03/12/2020 05/18/2019 06/29/2018 04/28/2018 12/28/2017 05/13/2017  Decreased Interest 1 0 1 1 1 1 3   Down, Depressed, Hopeless 1 0 1 1 1 1 3   PHQ - 2 Score 2 0 2 2 2 2 6   Altered sleeping - - - - - 1 -  Tired, decreased energy - - - - - 1 -  Change in appetite - - - - - 1 -  Feeling bad or failure about yourself  - - - - - 1 -  Trouble concentrating - - - - - 1 -  Moving slowly or fidgety/restless - - - - - 1 -  Suicidal thoughts - - - - - 0 -  PHQ-9 Score - - - - - 8 -  Difficult doing work/chores - - - - -  Somewhat difficult -  Some recent data  might be hidden   Review of Systems  Musculoskeletal: Positive for arthralgias and back pain.  Neurological: Positive for headaches.  All other systems reviewed and are negative.      Objective:   Physical Exam Vitals and nursing note reviewed.  Constitutional:      Appearance: Normal appearance. He is obese.  Cardiovascular:     Rate and Rhythm: Normal rate and regular rhythm.     Pulses: Normal pulses.     Heart sounds: Normal heart sounds.  Pulmonary:     Effort: Pulmonary effort is normal.     Breath sounds: Normal breath sounds.  Musculoskeletal:     Cervical back: Normal range of motion and neck supple.     Comments: Normal Muscle Bulk and Muscle Testing Reveals:  Right:Upper Extremities: Decreased ROM 90 Degrees  and Muscle Strength 4/5 Left: Full ROM and Muscle Strength 5/5  Thoracic Paraspinal Tenderness: T-7-T-9 Lumbar Paraspinal Tenderness: L-3--5 Lower Extremities: Decreased ROM and Muscle Strength 5/5 Bilateral Lower Extremities Flexion Produces Pain into his Bilateral Knees and Bilateral Ankles Arises from Table Slowly  Narrow Based   Skin:    General: Skin is warm and dry.  Neurological:     Mental Status: He is alert and oriented to person, place, and time.  Psychiatric:        Mood and Affect: Mood normal.        Behavior: Behavior normal.           Assessment & Plan:  1. MS: Neurology Following.Continue to Monitor.12/19/2020 2. 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 Refilled: Oxycodone 10 mg one tablet every 6 hours as needed. #120.Second script sent for the following month.12/19/2020. We will continue the opioid monitoring program, this consists of regular clinic visits, examinations, urine drug screen, pill counts as well as use of [place] Controlled Substance Reporting system. A 12 month History has been reviewed on the [place] Controlled Substance  Reporting Systemon05/11/2020 3. Low back pain with Radiculopathy RLE/: Continued topamax.12/19/2020 4. Chronic pancreatitis: PCP and Gastroenterologist Following.12/19/2020 5. Insomnia: Continue Pamelor.Continue to Monitor.12/19/2020 6. PTSD: PsychiatryandDr. Kieth Brightly following.Marland Kitchen05/11/2020. 7. Lupus: Rheumatology Following.12/19/2020 8.ChronicMigraine/ Vascular Headaches: Continue Imitrex,Topamaxand Aimovig.Continue to Monitor.12/19/2020 9. Right Knee Pain:No complaints today.Continue HEP as Tolerated. Continue to Monitor.12/19/2020 10. Muscle Spasm: Continue Baclofen/ Robaxin. Continue to Monitor.12/19/2020 11. Cervicalgia: No complaints today. Continue HEP as tolerated. Continue to monitor. 12/19/2020 12. Polyarthralgia: Continue current medication regimen. Continue to monitor.12/19/2020  F/U in 2 months.

## 2020-12-24 ENCOUNTER — Telehealth: Payer: Self-pay | Admitting: Registered Nurse

## 2020-12-24 DIAGNOSIS — R531 Weakness: Secondary | ICD-10-CM

## 2020-12-24 DIAGNOSIS — Z5181 Encounter for therapeutic drug level monitoring: Secondary | ICD-10-CM

## 2020-12-24 DIAGNOSIS — M329 Systemic lupus erythematosus, unspecified: Secondary | ICD-10-CM

## 2020-12-24 DIAGNOSIS — G894 Chronic pain syndrome: Secondary | ICD-10-CM

## 2020-12-24 DIAGNOSIS — Z79899 Other long term (current) drug therapy: Secondary | ICD-10-CM

## 2020-12-24 MED ORDER — OXYCODONE HCL 10 MG PO TABS: 10.0000 mg | ORAL_TABLET | Freq: Four times a day (QID) | ORAL | 0 refills | Status: DC | PRN

## 2020-12-24 MED ORDER — CLONAZEPAM 0.5 MG PO TABS: 0.5000 mg | ORAL_TABLET | Freq: Two times a day (BID) | ORAL | 2 refills | Status: DC | PRN

## 2020-12-24 NOTE — Telephone Encounter (Signed)
PMP was Reviewed: [facility] called and Clonazepam and Oxycodone prescription was removed. Prescriptions sent to [facility] per the [facility]. Marshell Garfinkel aware via My-Chart and her mother notified via telephone.

## 2021-01-03 ENCOUNTER — Encounter: Payer: Medicaid Other | Admitting: Psychology

## 2021-01-17 ENCOUNTER — Other Ambulatory Visit: Payer: Self-pay | Admitting: Nurse Practitioner

## 2021-01-17 DIAGNOSIS — R413 Other amnesia: Secondary | ICD-10-CM

## 2021-01-18 ENCOUNTER — Telehealth: Payer: Self-pay

## 2021-01-18 NOTE — Telephone Encounter (Signed)
Prior Authorization for Sumatriptan Succinate 100 mg submitted.

## 2021-01-22 NOTE — Telephone Encounter (Signed)
APPROVED from 01/18/2021 - 07/17/2021

## 2021-01-24 ENCOUNTER — Ambulatory Visit
Admission: RE | Admit: 2021-01-24 | Discharge: 2021-01-24 | Disposition: A | Discharge status: Home / Self Care | Payer: Medicaid Other | Source: Ambulatory Visit | Attending: Nurse Practitioner | Admitting: Nurse Practitioner

## 2021-01-24 ENCOUNTER — Other Ambulatory Visit: Payer: Self-pay

## 2021-01-24 DIAGNOSIS — R413 Other amnesia: Secondary | ICD-10-CM

## 2021-01-24 MED ORDER — GADOBENATE DIMEGLUMINE 529 MG/ML IV SOLN
20.0000 mL | Freq: Once | INTRAVENOUS | Status: AC | PRN
  Administered 2021-01-24: 20 mL via INTRAVENOUS

## 2021-02-11 ENCOUNTER — Other Ambulatory Visit: Payer: Self-pay | Admitting: Registered Nurse

## 2021-02-11 DIAGNOSIS — R531 Weakness: Secondary | ICD-10-CM

## 2021-02-11 DIAGNOSIS — G894 Chronic pain syndrome: Secondary | ICD-10-CM

## 2021-02-11 DIAGNOSIS — G43719 Chronic migraine without aura, intractable, without status migrainosus: Secondary | ICD-10-CM

## 2021-02-11 DIAGNOSIS — G35 Multiple sclerosis: Secondary | ICD-10-CM

## 2021-02-11 DIAGNOSIS — M329 Systemic lupus erythematosus, unspecified: Secondary | ICD-10-CM

## 2021-02-13 ENCOUNTER — Encounter: Payer: Medicaid Other | Admitting: Registered Nurse

## 2021-02-27 ENCOUNTER — Telehealth: Payer: Self-pay | Admitting: Physical Medicine & Rehabilitation

## 2021-02-27 DIAGNOSIS — Z79899 Other long term (current) drug therapy: Secondary | ICD-10-CM

## 2021-02-27 DIAGNOSIS — M329 Systemic lupus erythematosus, unspecified: Secondary | ICD-10-CM

## 2021-02-27 DIAGNOSIS — G894 Chronic pain syndrome: Secondary | ICD-10-CM

## 2021-02-27 DIAGNOSIS — R531 Weakness: Secondary | ICD-10-CM

## 2021-02-27 DIAGNOSIS — Z5181 Encounter for therapeutic drug level monitoring: Secondary | ICD-10-CM

## 2021-02-27 MED ORDER — OXYCODONE HCL 10 MG PO TABS: 10.0000 mg | ORAL_TABLET | Freq: Four times a day (QID) | ORAL | 0 refills | Status: DC | PRN

## 2021-02-27 MED ORDER — CLONAZEPAM 0.5 MG PO TABS: 0.5000 mg | ORAL_TABLET | Freq: Two times a day (BID) | ORAL | 2 refills | Status: DC | PRN

## 2021-02-27 NOTE — Telephone Encounter (Signed)
Patient needs a refill on Oxycodone and Klonopin.  Can we refill these medications and reschedule patient next month with Riley Lam, patient had an appt with her tomorrow but is out of office sick?  Thank you.

## 2021-02-27 NOTE — Telephone Encounter (Signed)
Rx'es filled

## 2021-02-28 ENCOUNTER — Encounter: Payer: Medicaid Other | Admitting: Registered Nurse

## 2021-03-01 ENCOUNTER — Telehealth: Payer: Self-pay | Admitting: Physical Medicine & Rehabilitation

## 2021-03-01 ENCOUNTER — Other Ambulatory Visit: Payer: Self-pay | Admitting: *Deleted

## 2021-03-01 DIAGNOSIS — R531 Weakness: Secondary | ICD-10-CM

## 2021-03-01 DIAGNOSIS — G894 Chronic pain syndrome: Secondary | ICD-10-CM

## 2021-03-01 DIAGNOSIS — Z5181 Encounter for therapeutic drug level monitoring: Secondary | ICD-10-CM

## 2021-03-01 DIAGNOSIS — M329 Systemic lupus erythematosus, unspecified: Secondary | ICD-10-CM

## 2021-03-01 DIAGNOSIS — Z79899 Other long term (current) drug therapy: Secondary | ICD-10-CM

## 2021-03-01 MED ORDER — CLONAZEPAM 0.5 MG PO TABS: 0.5000 mg | ORAL_TABLET | Freq: Two times a day (BID) | ORAL | 2 refills | Status: DC | PRN

## 2021-03-01 MED ORDER — OXYCODONE HCL 10 MG PO TABS: 10.0000 mg | ORAL_TABLET | Freq: Four times a day (QID) | ORAL | 0 refills | Status: DC | PRN

## 2021-03-01 NOTE — Telephone Encounter (Signed)
Scripts cancelled at [facility] and re-directed to [facility]

## 2021-03-01 NOTE — Telephone Encounter (Signed)
Patient needs meds rerouted to [facility]. Orders at [facility] have been cancelled. New orders queued up for electronic refill.

## 2021-03-01 NOTE — Telephone Encounter (Signed)
Patient needs her Klonopin an Oxycodone sent to [facility] on file.  Please call Elease Hashimoto @ [phone].

## 2021-03-05 ENCOUNTER — Telehealth: Payer: Self-pay | Admitting: Registered Nurse

## 2021-03-05 DIAGNOSIS — Z79899 Other long term (current) drug therapy: Secondary | ICD-10-CM

## 2021-03-05 DIAGNOSIS — R531 Weakness: Secondary | ICD-10-CM

## 2021-03-05 DIAGNOSIS — Z5181 Encounter for therapeutic drug level monitoring: Secondary | ICD-10-CM

## 2021-03-05 DIAGNOSIS — M329 Systemic lupus erythematosus, unspecified: Secondary | ICD-10-CM

## 2021-03-05 DIAGNOSIS — G894 Chronic pain syndrome: Secondary | ICD-10-CM

## 2021-03-05 MED ORDER — OXYCODONE HCL 10 MG PO TABS: 10.0000 mg | ORAL_TABLET | Freq: Four times a day (QID) | ORAL | 0 refills | Status: DC | PRN

## 2021-03-05 MED ORDER — CLONAZEPAM 0.5 MG PO TABS: 0.5000 mg | ORAL_TABLET | Freq: Two times a day (BID) | ORAL | 2 refills | Status: DC | PRN

## 2021-03-05 NOTE — Telephone Encounter (Signed)
Return [name] call , PMP was Reviewed. Oxycodone and Clonazepam e-scribed today, [name] is awre of the above and verbalizes understanding.  Mary [name] has a [employer]

## 2021-03-05 NOTE — Addendum Note (Signed)
Addended by: Jones Bales on: 03/05/2021 02:34 PM   Modules accepted: Orders

## 2021-03-05 NOTE — Telephone Encounter (Signed)
Patient is on medicaid when meds were called in by [facility] the pharmacy would not fill since the prescriber  on record is Maisie Fus.  [phone] is the moms number

## 2021-03-09 ENCOUNTER — Other Ambulatory Visit: Payer: Self-pay | Admitting: Internal Medicine

## 2021-03-28 ENCOUNTER — Encounter: Payer: Medicaid Other | Attending: Physical Medicine & Rehabilitation | Admitting: Registered Nurse

## 2021-03-28 ENCOUNTER — Encounter: Payer: Self-pay | Admitting: Registered Nurse

## 2021-03-28 ENCOUNTER — Other Ambulatory Visit: Payer: Self-pay

## 2021-03-28 VITALS — BP 126/86 | HR 111 | Temp 98.5°F | Ht 63.0 in | Wt 251.0 lb

## 2021-03-28 DIAGNOSIS — M2550 Pain in unspecified joint: Secondary | ICD-10-CM | POA: Insufficient documentation

## 2021-03-28 DIAGNOSIS — M329 Systemic lupus erythematosus, unspecified: Secondary | ICD-10-CM | POA: Diagnosis present

## 2021-03-28 DIAGNOSIS — R531 Weakness: Secondary | ICD-10-CM | POA: Insufficient documentation

## 2021-03-28 DIAGNOSIS — G894 Chronic pain syndrome: Secondary | ICD-10-CM | POA: Insufficient documentation

## 2021-03-28 DIAGNOSIS — Z79899 Other long term (current) drug therapy: Secondary | ICD-10-CM | POA: Diagnosis present

## 2021-03-28 DIAGNOSIS — M47816 Spondylosis without myelopathy or radiculopathy, lumbar region: Secondary | ICD-10-CM | POA: Insufficient documentation

## 2021-03-28 DIAGNOSIS — G43719 Chronic migraine without aura, intractable, without status migrainosus: Secondary | ICD-10-CM | POA: Diagnosis present

## 2021-03-28 DIAGNOSIS — F331 Major depressive disorder, recurrent, moderate: Secondary | ICD-10-CM

## 2021-03-28 DIAGNOSIS — Z5181 Encounter for therapeutic drug level monitoring: Secondary | ICD-10-CM | POA: Insufficient documentation

## 2021-03-28 MED ORDER — OXYCODONE HCL 10 MG PO TABS: 10.0000 mg | ORAL_TABLET | Freq: Four times a day (QID) | ORAL | 0 refills | Status: DC | PRN

## 2021-03-28 NOTE — Progress Notes (Signed)
Subjective:    Patient ID: Mary [name], adult    DOB: 10/21/1975, 45 y.o.   MRN: [id number]  HPI: Mary [name] is a 45 y.o. female who returns for follow up appointment for chronic pain and medication refill. He states his  pain is located in his lower back, bilateral hips and generalized joint pain. Also reports he has a headache today.He rates his pain 8.His  current exercise regime is walking and performing stretching exercises.  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  Mary [name] Morphine equivalent is 60.00 MME.  He is also prescribed clonazepam/ We have discussed the black box warning of using opioids and benzodiazepines. I highlighted the dangers of using these drugs together and discussed the adverse events including respiratory suppression, overdose, cognitive impairment and importance of compliance with current regimen. We will continue to monitor and adjust as indicated.   Oral Swab ordered today.    Pain Inventory Average Pain 6 Pain Right Now 8 My pain is sharp, burning, stabbing, tingling, and aching  In the last 24 hours, has pain interfered with the following? General activity 9 Relation with others 9 Enjoyment of life 10 What TIME of day is your pain at its worst? morning , evening, and night Sleep (in general) Poor  Pain is worse with: walking, bending, standing, and some activites Pain improves with: medication Relief from Meds: 8  Family History  Problem Relation Age of Onset   Diabetes Mother    Hypertension Mother    Cervical cancer Mother    Heart disease Mother    Diabetes Father    Hypertension Father    Heart disease Father    Colitis Maternal Aunt    Diabetes Other        many family members   CVA Maternal Grandmother    Lupus Neg Hx    Sarcoidosis Neg Hx    Pancreatitis Neg Hx    Ataxia Neg Hx    Chorea Neg Hx    Dementia Neg Hx    Mental retardation Neg Hx    Migraines Neg Hx     Multiple sclerosis Neg Hx    Neurofibromatosis Neg Hx    Neuropathy Neg Hx    Parkinsonism Neg Hx    Seizures Neg Hx    Stroke Neg Hx    Colon cancer Neg Hx    Social History   Socioeconomic History   Marital status: Significant Other    Spouse name: Not on file   Number of children: 0   Years of education: Not on file   Highest education level: Not on file  Occupational History   Not on file  Tobacco Use   Smoking status: Former    Packs/day: 0.50    Years: 20.00    Pack years: 10.00    Types: Cigarettes    Quit date: 05/22/2013    Years since quitting: 7.8   Smokeless tobacco: Never  Vaping Use   Vaping Use: Some days   Substances: Nicotine  Substance and Sexual Activity   Alcohol use: No    Alcohol/week: 0.0 standard drinks   Drug use: No   Sexual activity: Never    Birth control/protection: Abstinence  Other Topics Concern   Not on file  Social History Narrative   ** Merged History Encounter **       Lives permanently in [place] with mom and stepfather.  Has not started working yet and was  unemployed in [place].         Social Determinants of Health   Financial Resource Strain: Not on file  Food Insecurity: Not on file  Transportation Needs: Not on file  Physical Activity: Not on file  Stress: Not on file  Social Connections: Not on file   Past Surgical History:  Procedure Laterality Date   ABDOMINAL SURGERY  ~ 2007   some sort of pancreatic cyst drainage.    ABDOMINAL SURGERY     ADENOIDECTOMY     CHOLECYSTECTOMY     ~ age 88-laparoscopic   CHOLECYSTECTOMY     EUS N/A 04/14/2013   Procedure: UPPER ENDOSCOPIC ULTRASOUND (EUS) LINEAR;  Surgeon: Rachael Fee, MD;  Location: [facility];  Service: Endoscopy;  Laterality: N/A;   HERNIA REPAIR  2017   ROUX-EN-Y GASTRIC BYPASS     ROUX-EN-Y PROCEDURE     stent to pancreatic cyst that became infected within 36 hours   Past Surgical History:  Procedure Laterality Date   ABDOMINAL SURGERY  ~ 2007    some sort of pancreatic cyst drainage.    ABDOMINAL SURGERY     ADENOIDECTOMY     CHOLECYSTECTOMY     ~ age 88-laparoscopic   CHOLECYSTECTOMY     EUS N/A 04/14/2013   Procedure: UPPER ENDOSCOPIC ULTRASOUND (EUS) LINEAR;  Surgeon: Rachael Fee, MD;  Location: [facility];  Service: Endoscopy;  Laterality: N/A;   HERNIA REPAIR  2017   ROUX-EN-Y GASTRIC BYPASS     ROUX-EN-Y PROCEDURE     stent to pancreatic cyst that became infected within 36 hours   Past Medical History:  Diagnosis Date   Abdominal hernia    Adrenal insufficiency (HCC) 04/23/2013   ??   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    Pancreatic cyst 2002 onset   Pancreatitis 03-24-13   2002, 2'2013, 03-14-13   PTSD (post-traumatic stress disorder)    Ventral hernia    BP 126/86 (BP Location: Right Arm)   Pulse (!) 111   Temp 98.5 F (36.9 C) (Oral)   Ht 5\' 3"  (1.6 m)   Wt 251 lb (113.9 kg)   SpO2 91%   BMI 44.46 kg/m   Opioid Risk Score:   Fall Risk Score:  `1  Depression screen PHQ 2/9  Depression screen [facility] 2/9 12/19/2020 08/01/2020 03/12/2020 05/18/2019 06/29/2018 04/28/2018 12/28/2017  Decreased Interest 1 1 0 1 1 1 1   Down, Depressed, Hopeless 1 1 0 1 1 1 1   PHQ - 2 Score 2 2 0 2 2 2 2   Altered sleeping - - - - - - 1  Tired, decreased energy - - - - - - 1  Change in appetite - - - - - - 1  Feeling bad or failure about yourself  - - - - - - 1  Trouble concentrating - - - - - - 1  Moving slowly or fidgety/restless - - - - - - 1  Suicidal thoughts - - - - - - 0  PHQ-9 Score - - - - - - 8  Difficult doing work/chores - - - - - - Somewhat difficult  Some recent data might be hidden  Review of Systems  Constitutional: Negative.   HENT: Negative.    Eyes:  Negative.   Respiratory: Negative.    Cardiovascular: Negative.   Gastrointestinal: Negative.   Endocrine: Negative.   Genitourinary: Negative.   Musculoskeletal:  Positive for back pain and gait problem.       Joint pain , pain in both knees , pain in both arms   Skin: Negative.   Allergic/Immunologic: Negative.   Hematological: Negative.   Psychiatric/Behavioral: Negative.        Objective:   Physical Exam Vitals and nursing note reviewed.  Constitutional:      Appearance: Normal appearance.  Cardiovascular:     Rate and Rhythm: Normal rate and regular rhythm.     Pulses: Normal pulses.     Heart sounds: Normal heart sounds.  Pulmonary:     Effort: Pulmonary effort is normal.     Breath sounds: Normal breath sounds.  Musculoskeletal:     Cervical back: Normal range of motion and neck supple.     Comments: Normal Muscle Bulk and Muscle Testing Reveals:  Upper Extremities: Full ROM and Muscle Strength 5/5 Lumbar Paraspinal Tenderness: L-3-L-5 Bilateral Greater Trochanter Tenderness Lower Extremities: Decreased ROM and Muscle Strength 5/5 Bilateral Lower Extremities Flexion Produces Pain into his Bilateral Knees and Bilateral Ankles Arises from Table with Ease Narrow Based  Gait     Skin:    General: Skin is warm and dry.  Neurological:     Mental Status: He is alert and oriented to person, place, and time.  Psychiatric:        Mood and Affect: Mood normal.        Behavior: Behavior normal.         Assessment & Plan:  1. MS: Neurology Following. Continue to Monitor. 03/28/2021 2. 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 Refilled: Oxycodone 10 mg one tablet every 6 hours as needed. #120. Second script sent for the following month. 03/28/2021. We will continue the opioid monitoring program, this consists of regular clinic visits, examinations, urine drug screen, pill counts as well as use of [place] Controlled Substance Reporting system. A 12 month History has been reviewed on the [place] Controlled Substance Reporting System on 03/28/2021 3. Low back pain with  Radiculopathy RLE/: Continued  topamax.03/28/2021  4. Chronic pancreatitis: PCP and Gastroenterologist Following. 03/28/2021   5. Insomnia: Continue Pamelor. Continue to Monitor.03/28/2021 6. PTSD: Psychiatry and Dr. Kieth Brightly following. . 03/28/2021. 7. Lupus: Rheumatology Following. 03/28/2021 8. Chronic Migraine/ Vascular Headaches: Continue Imitrex,Topamax and Aimovig.Continue to Monitor. 03/28/2021 9. Right Knee Pain: No complaints today. Continue HEP as Tolerated. Continue to Monitor. 03/28/2021 10. Muscle Spasm: Continue Baclofen/ Robaxin. Continue to Monitor. 03/28/2021 11. Cervicalgia: No complaints today. Continue HEP as tolerated. Continue to monitor. 03/28/2021 12. Polyarthralgia: Continue current medication regimen. Continue to monitor. 03/28/2021 13. Right Sided Weakness: Continue HEP as Tolerated. Continue to Monitor.    F/U in 2 months.

## 2021-04-04 LAB — DRUG TOX MONITOR 1 W/CONF, ORAL FLD
Alprazolam: NEGATIVE ng/mL (ref <0.50)
Amphetamines: NEGATIVE ng/mL (ref <10)
Barbiturates: NEGATIVE ng/mL (ref <10)
Benzodiazepines: POSITIVE ng/mL — AB (ref <0.50)
Buprenorphine: NEGATIVE ng/mL (ref <0.10)
Chlordiazepoxide: NEGATIVE ng/mL (ref <0.50)
Clonazepam: 0.59 ng/mL — ABNORMAL HIGH (ref <0.50)
Cocaine: NEGATIVE ng/mL (ref <5.0)
Codeine: NEGATIVE ng/mL (ref <2.5)
Cotinine: 64.5 ng/mL — ABNORMAL HIGH (ref <5.0)
Diazepam: NEGATIVE ng/mL (ref <0.50)
Dihydrocodeine: NEGATIVE ng/mL (ref <2.5)
Fentanyl: NEGATIVE ng/mL (ref <0.10)
Flunitrazepam: NEGATIVE ng/mL (ref <0.50)
Flurazepam: NEGATIVE ng/mL (ref <0.50)
Heroin Metabolite: NEGATIVE ng/mL (ref <1.0)
Hydrocodone: NEGATIVE ng/mL (ref <2.5)
Hydromorphone: NEGATIVE ng/mL (ref <2.5)
Lorazepam: NEGATIVE ng/mL (ref <0.50)
MARIJUANA: NEGATIVE ng/mL (ref <2.5)
MDMA: NEGATIVE ng/mL (ref <10)
Meprobamate: NEGATIVE ng/mL (ref <2.5)
Methadone: NEGATIVE ng/mL (ref <5.0)
Midazolam: NEGATIVE ng/mL (ref <0.50)
Morphine: NEGATIVE ng/mL (ref <2.5)
Nicotine Metabolite: POSITIVE ng/mL — AB (ref <5.0)
Nordiazepam: NEGATIVE ng/mL (ref <0.50)
Norhydrocodone: NEGATIVE ng/mL (ref <2.5)
Noroxycodone: 45.5 ng/mL — ABNORMAL HIGH (ref <2.5)
Opiates: POSITIVE ng/mL — AB (ref <2.5)
Oxazepam: NEGATIVE ng/mL (ref <0.50)
Oxycodone: 143.9 ng/mL — ABNORMAL HIGH (ref <2.5)
Oxymorphone: NEGATIVE ng/mL (ref <2.5)
Phencyclidine: NEGATIVE ng/mL (ref <10)
Tapentadol: NEGATIVE ng/mL (ref <5.0)
Temazepam: NEGATIVE ng/mL (ref <0.50)
Tramadol: NEGATIVE ng/mL (ref <5.0)
Triazolam: NEGATIVE ng/mL (ref <0.50)
Zolpidem: NEGATIVE ng/mL (ref <5.0)

## 2021-04-04 LAB — DRUG TOX ALC METAB W/CON, ORAL FLD: Alcohol Metabolite: NEGATIVE ng/mL (ref <25)

## 2021-04-05 ENCOUNTER — Other Ambulatory Visit: Payer: Self-pay | Admitting: Registered Nurse

## 2021-04-05 DIAGNOSIS — G43719 Chronic migraine without aura, intractable, without status migrainosus: Secondary | ICD-10-CM

## 2021-04-08 ENCOUNTER — Telehealth: Payer: Self-pay | Admitting: *Deleted

## 2021-04-08 NOTE — Telephone Encounter (Signed)
Oral swab drug screen was consistent for prescribed medications.  ?

## 2021-05-10 ENCOUNTER — Telehealth: Payer: Self-pay

## 2021-05-10 NOTE — Telephone Encounter (Signed)
PA submitted for Oxycodone 

## 2021-05-14 NOTE — Telephone Encounter (Signed)
Oxycodone approved 05/10/2021-11/06/2021

## 2021-05-31 ENCOUNTER — Encounter: Payer: Self-pay | Admitting: Registered Nurse

## 2021-05-31 ENCOUNTER — Encounter: Payer: Medicaid Other | Attending: Physical Medicine & Rehabilitation | Admitting: Registered Nurse

## 2021-05-31 ENCOUNTER — Other Ambulatory Visit: Payer: Self-pay

## 2021-05-31 ENCOUNTER — Telehealth: Payer: Self-pay | Admitting: Registered Nurse

## 2021-05-31 VITALS — BP 126/86 | HR 111 | Temp 98.5°F | Ht 63.0 in | Wt 247.0 lb

## 2021-05-31 DIAGNOSIS — Z5181 Encounter for therapeutic drug level monitoring: Secondary | ICD-10-CM

## 2021-05-31 DIAGNOSIS — M47816 Spondylosis without myelopathy or radiculopathy, lumbar region: Secondary | ICD-10-CM

## 2021-05-31 DIAGNOSIS — R531 Weakness: Secondary | ICD-10-CM | POA: Diagnosis not present

## 2021-05-31 DIAGNOSIS — Z79899 Other long term (current) drug therapy: Secondary | ICD-10-CM

## 2021-05-31 DIAGNOSIS — M2550 Pain in unspecified joint: Secondary | ICD-10-CM

## 2021-05-31 DIAGNOSIS — G43719 Chronic migraine without aura, intractable, without status migrainosus: Secondary | ICD-10-CM

## 2021-05-31 DIAGNOSIS — G894 Chronic pain syndrome: Secondary | ICD-10-CM

## 2021-05-31 DIAGNOSIS — M5416 Radiculopathy, lumbar region: Secondary | ICD-10-CM

## 2021-05-31 DIAGNOSIS — M329 Systemic lupus erythematosus, unspecified: Secondary | ICD-10-CM

## 2021-05-31 MED ORDER — OXYCODONE HCL 10 MG PO TABS: 10.0000 mg | ORAL_TABLET | Freq: Four times a day (QID) | ORAL | 0 refills | Status: DC | PRN

## 2021-05-31 MED ORDER — AIMOVIG 140 MG/ML ~~LOC~~ SOAJ: SUBCUTANEOUS | 5 refills | Status: DC

## 2021-05-31 NOTE — Progress Notes (Signed)
Subjective:    Patient ID: Mary [name], adult    DOB: May 23, 1976, 45 y.o.   MRN: [id number]  HPI: Mary [name] is a 45 y.o. female who called the office yesterday reporting he was having a MS and Lupus Flare, he reports he is bed and wheelchair bound. He agrees with My-Chart Video visit and verbalizes understanding. He states his  pain is located in head ( has a headache today), lower back pain radiating into his bilateral lower extremities and generalized pain. He  rates his pain 9. His current exercise regime is  performing stretching exercises.  Mary [name] reports he is having increase anxiety and panic attack since the Lupus and MS Flare, asked about increasing the clonazepam. She has a Veterinary surgeon, he was instructed to call the counselor and this provider will discuss the above with Dr Riley Kill, he verbalizes understanding. He was instructed to send a MY-Chart message with a update, he verbalizes understanding.  Mary [name] Morphine equivalent is 60.00  MME.  Last Orals swab was performed on 03/28/2021, it was consistent.    Pain Inventory Average Pain 7 Pain Right Now 9 My pain is constant, sharp, burning, dull, stabbing, tingling, and aching  In the last 24 hours, has pain interfered with the following? General activity 10 Relation with others 8 Enjoyment of life 10 What TIME of day is your pain at its worst? morning  and night Sleep (in general) Poor  Pain is worse with: standing Pain improves with: medication Relief from Meds: 3  Family History  Problem Relation Age of Onset   Diabetes Mother    Hypertension Mother    Cervical cancer Mother    Heart disease Mother    Diabetes Father    Hypertension Father    Heart disease Father    Colitis Maternal Aunt    Diabetes Other        many family members   CVA Maternal Grandmother    Lupus Neg Hx    Sarcoidosis Neg Hx    Pancreatitis Neg Hx    Ataxia Neg Hx    Chorea Neg Hx    Dementia Neg Hx    Mental retardation  Neg Hx    Migraines Neg Hx    Multiple sclerosis Neg Hx    Neurofibromatosis Neg Hx    Neuropathy Neg Hx    Parkinsonism Neg Hx    Seizures Neg Hx    Stroke Neg Hx    Colon cancer Neg Hx    Social History   Socioeconomic History   Marital status: Significant Other    Spouse name: Not on file   Number of children: 0   Years of education: Not on file   Highest education level: Not on file  Occupational History   Not on file  Tobacco Use   Smoking status: Former    Packs/day: 0.50    Years: 20.00    Pack years: 10.00    Types: Cigarettes    Quit date: 05/22/2013    Years since quitting: 8.0   Smokeless tobacco: Never  Vaping Use   Vaping Use: Some days   Substances: Nicotine  Substance and Sexual Activity   Alcohol use: No    Alcohol/week: 0.0 standard drinks   Drug use: No   Sexual activity: Never    Birth control/protection: Abstinence  Other Topics Concern   Not on file  Social History Narrative   ** Merged History Encounter **  Lives permanently in [place] with mom and stepfather.  Has not started working yet and was unemployed in [place].         Social Determinants of Health   Financial Resource Strain: Not on file  Food Insecurity: Not on file  Transportation Needs: Not on file  Physical Activity: Not on file  Stress: Not on file  Social Connections: Not on file   Past Surgical History:  Procedure Laterality Date   ABDOMINAL SURGERY  ~ 2007   some sort of pancreatic cyst drainage.    ABDOMINAL SURGERY     ADENOIDECTOMY     CHOLECYSTECTOMY     ~ age 12-laparoscopic   CHOLECYSTECTOMY     EUS N/A 04/14/2013   Procedure: UPPER ENDOSCOPIC ULTRASOUND (EUS) LINEAR;  Surgeon: Rachael Fee, MD;  Location: [facility];  Service: Endoscopy;  Laterality: N/A;   HERNIA REPAIR  2017   ROUX-EN-Y GASTRIC BYPASS     ROUX-EN-Y PROCEDURE     stent to pancreatic cyst that became infected within 36 hours   Past Surgical History:  Procedure Laterality Date    ABDOMINAL SURGERY  ~ 2007   some sort of pancreatic cyst drainage.    ABDOMINAL SURGERY     ADENOIDECTOMY     CHOLECYSTECTOMY     ~ age 12-laparoscopic   CHOLECYSTECTOMY     EUS N/A 04/14/2013   Procedure: UPPER ENDOSCOPIC ULTRASOUND (EUS) LINEAR;  Surgeon: Rachael Fee, MD;  Location: [facility];  Service: Endoscopy;  Laterality: N/A;   HERNIA REPAIR  2017   ROUX-EN-Y GASTRIC BYPASS     ROUX-EN-Y PROCEDURE     stent to pancreatic cyst that became infected within 36 hours   Past Medical History:  Diagnosis Date   Abdominal hernia    Adrenal insufficiency (HCC) 04/23/2013   ??   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    Pancreatic cyst 2002 onset   Pancreatitis 03-24-13   2002, 2'2013, 03-14-13   PTSD (post-traumatic stress disorder)    Ventral hernia    Ht 5\' 3"  (1.6 m)   Wt 247 lb (112 kg) Comment: per patient at last visit on 05/23/21  BMI 43.75 kg/m   Opioid Risk Score:   Fall Risk Score:  `1  Depression screen PHQ 2/9  Depression screen [facility] 2/9 05/31/2021 03/28/2021 12/19/2020 08/01/2020 03/12/2020 05/18/2019 06/29/2018  Decreased Interest 1 1 1 1  0 1 1  Down, Depressed, Hopeless 3 1 1 1  0 1 1  PHQ - 2 Score 4 2 2 2  0 2 2  Altered sleeping - - - - - - -  Tired, decreased energy - - - - - - -  Change in appetite - - - - - - -  Feeling bad or failure about yourself  - - - - - - -  Trouble concentrating - - - - - - -  Moving slowly or fidgety/restless - - - - - - -  Suicidal thoughts - - - - - - -  PHQ-9 Score - - - - - - -  Difficult doing work/chores - - - - - - -  Some recent data might be hidden  Review of Systems  Constitutional: Negative.   HENT: Negative.    Eyes: Negative.   Respiratory: Negative.     Cardiovascular: Negative.   Gastrointestinal: Negative.   Endocrine: Negative.   Genitourinary: Negative.   Musculoskeletal:  Positive for gait problem.  Skin: Negative.   Allergic/Immunologic: Negative.   Neurological:  Positive for headaches.  Hematological: Negative.   Psychiatric/Behavioral:  Positive for dysphoric mood.       Objective:   Physical Exam Vitals and nursing note reviewed.  Musculoskeletal:     Comments: No Physical Exam Performed: My-Chart Video Visit         Assessment & Plan:  1. MS: Neurology Following. Continue to Monitor. 05/31/2021 2. 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 Refilled: Oxycodone 10 mg one tablet every 6 hours as needed. #120. Second script sent for the following month. 05/31/2021. We will continue the opioid monitoring program, this consists of regular clinic visits, examinations, urine drug screen, pill counts as well as use of [place] Controlled Substance Reporting system. A 12 month History has been reviewed on the [place] Controlled Substance Reporting System on 03/28/2021 3. Low back pain with  Radiculopathy RLE/: Continued  topamax.05/31/2021  4. Chronic pancreatitis: PCP and Gastroenterologist Following. 05/31/2021   5. Insomnia: Continue Pamelor. Continue to Monitor.05/31/2021 6. PTSD: Psychiatry and Dr. Kieth Brightly following. . 05/31/2021. 7. Lupus: Rheumatology Following. 05/31/2021 8. Chronic Migraine/ Vascular Headaches: Continue Imitrex,Topamax and Aimovig.Continue to Monitor. 05/31/2021 9. Right Knee Pain: No complaints today. Continue HEP as Tolerated. Continue to Monitor. 05/31/2021 10. Muscle Spasm: Continue Baclofen/ Robaxin. Continue to Monitor. 05/31/2021 11. Cervicalgia: No complaints today. Continue HEP as tolerated. Continue to monitor. 05/31/2021 12. Polyarthralgia: Continue current medication regimen. Continue to monitor.  05/31/2021 13. Right Sided Weakness: Continue HEP as Tolerated. Continue to Monitor. 05/31/2021   F/U in 2 months. Established Patient My Chart Video Visit Location of Patient: In her Home Location of Provider: In the Office Total Time Spent: 30 Minutes

## 2021-05-31 NOTE — Telephone Encounter (Signed)
This provider was in contact with Dr Riley Kill regarding Mary [name] Forget anxiety and panic attacks. We will increase her clonazepam  0.5 mg to three times a day as needed. This provider called Mary [name], no answer left message.  Placed a call to Ms Dorothey Baseman ( step-mother) regarding the above, she verbalizes understanding regarding the clonazepam being increased to three times a day as needed .Ms. Dorothey Baseman stated she is awaiting a return call from psychiatry for an appointment and awaiting a neurology appointment.

## 2021-06-05 ENCOUNTER — Other Ambulatory Visit: Payer: Self-pay | Admitting: Registered Nurse

## 2021-06-05 DIAGNOSIS — Z79899 Other long term (current) drug therapy: Secondary | ICD-10-CM

## 2021-06-05 DIAGNOSIS — G894 Chronic pain syndrome: Secondary | ICD-10-CM

## 2021-06-05 DIAGNOSIS — M329 Systemic lupus erythematosus, unspecified: Secondary | ICD-10-CM

## 2021-06-05 DIAGNOSIS — Z5181 Encounter for therapeutic drug level monitoring: Secondary | ICD-10-CM

## 2021-06-05 DIAGNOSIS — G35 Multiple sclerosis: Secondary | ICD-10-CM

## 2021-06-05 DIAGNOSIS — R531 Weakness: Secondary | ICD-10-CM

## 2021-06-05 DIAGNOSIS — G43719 Chronic migraine without aura, intractable, without status migrainosus: Secondary | ICD-10-CM

## 2021-07-01 ENCOUNTER — Other Ambulatory Visit: Payer: Self-pay | Admitting: Registered Nurse

## 2021-07-01 DIAGNOSIS — G43719 Chronic migraine without aura, intractable, without status migrainosus: Secondary | ICD-10-CM

## 2021-07-15 ENCOUNTER — Other Ambulatory Visit: Payer: Self-pay | Admitting: Registered Nurse

## 2021-07-15 DIAGNOSIS — M5416 Radiculopathy, lumbar region: Secondary | ICD-10-CM

## 2021-07-15 DIAGNOSIS — G43719 Chronic migraine without aura, intractable, without status migrainosus: Secondary | ICD-10-CM

## 2021-07-15 DIAGNOSIS — M329 Systemic lupus erythematosus, unspecified: Secondary | ICD-10-CM

## 2021-07-15 DIAGNOSIS — R531 Weakness: Secondary | ICD-10-CM

## 2021-07-15 DIAGNOSIS — G894 Chronic pain syndrome: Secondary | ICD-10-CM

## 2021-07-26 ENCOUNTER — Ambulatory Visit: Payer: Medicaid Other | Admitting: Registered Nurse

## 2021-08-05 ENCOUNTER — Telehealth: Payer: Self-pay

## 2021-08-05 ENCOUNTER — Encounter: Payer: Medicaid Other | Attending: Physical Medicine & Rehabilitation | Admitting: Registered Nurse

## 2021-08-05 ENCOUNTER — Encounter: Payer: Medicaid Other | Admitting: Registered Nurse

## 2021-08-05 ENCOUNTER — Other Ambulatory Visit: Payer: Self-pay

## 2021-08-05 ENCOUNTER — Encounter: Payer: Self-pay | Admitting: Registered Nurse

## 2021-08-05 VITALS — BP 126/86 | HR 111 | Ht 63.0 in | Wt 251.0 lb

## 2021-08-05 DIAGNOSIS — F4310 Post-traumatic stress disorder, unspecified: Secondary | ICD-10-CM

## 2021-08-05 DIAGNOSIS — R531 Weakness: Secondary | ICD-10-CM | POA: Diagnosis not present

## 2021-08-05 DIAGNOSIS — M47816 Spondylosis without myelopathy or radiculopathy, lumbar region: Secondary | ICD-10-CM | POA: Diagnosis not present

## 2021-08-05 DIAGNOSIS — M2550 Pain in unspecified joint: Secondary | ICD-10-CM

## 2021-08-05 DIAGNOSIS — F331 Major depressive disorder, recurrent, moderate: Secondary | ICD-10-CM

## 2021-08-05 DIAGNOSIS — M329 Systemic lupus erythematosus, unspecified: Secondary | ICD-10-CM

## 2021-08-05 DIAGNOSIS — Z79899 Other long term (current) drug therapy: Secondary | ICD-10-CM

## 2021-08-05 DIAGNOSIS — G894 Chronic pain syndrome: Secondary | ICD-10-CM

## 2021-08-05 DIAGNOSIS — G35 Multiple sclerosis: Secondary | ICD-10-CM

## 2021-08-05 DIAGNOSIS — Z5181 Encounter for therapeutic drug level monitoring: Secondary | ICD-10-CM

## 2021-08-05 MED ORDER — OXYCODONE HCL 10 MG PO TABS: 10.0000 mg | ORAL_TABLET | Freq: Four times a day (QID) | ORAL | 0 refills | Status: DC | PRN

## 2021-08-05 MED ORDER — CLONAZEPAM 0.5 MG PO TABS: 0.5000 mg | ORAL_TABLET | Freq: Three times a day (TID) | ORAL | 2 refills | Status: DC | PRN

## 2021-08-05 NOTE — Telephone Encounter (Signed)
Patient is rescheduled for 08/05/21 at 1:20 pm

## 2021-08-05 NOTE — Progress Notes (Signed)
Subjective:    Patient ID: Mary [name], adult    DOB: 1976/05/29, 45 y.o.   MRN: [id number]  HPI: Mary [name] is a 45 y.o. female whose appointment was changed to a My-Chart Video Visit, Mary [name] sent a My-Chart message reporting having issues with ambulation due MS and Lupus Flare, he reports and her mother issues, her appointment was changed to My-Chart Video visit, he verbalizes understanding. He states his pain is located in his lower back and reports generalized joint pain.Marland Kitchen He rates his pain 9. He's not following his current exercise regime at this time.  Mary [name] reported his pulse 111, he was asked to repeat vitals. Mary [name] states he will ask his partner to re-check vitals.   Mary [name] Morphine equivalent is 60.00 MME.   Mary [name]  is also prescribed clonazepam. .We have discussed the black box warning of using opioids and benzodiazepines. I highlighted the dangers of using these drugs together and discussed the adverse events including respiratory suppression, overdose, cognitive impairment and importance of compliance with current regimen. We will continue to monitor and adjust as indicated.   He is being closely monitored and under the care of his psychiatrist.  Last Oral Swab was Performed on 03/28/2021, it was consistent.     Pain Inventory Average Pain 9 Pain Right Now 9 My pain is constant, sharp, burning, dull, stabbing, tingling, and aching  In the last 24 hours, has pain interfered with the following? General activity 10 Relation with others 10 Enjoyment of life 10 What TIME of day is your pain at its worst? morning , daytime, evening, and night Sleep (in general) Poor  Pain is worse with: walking, bending, sitting, inactivity, standing, and some activites Pain improves with: rest and medication Relief from Meds: 6  Family History  Problem Relation Age of Onset   Diabetes Mother    Hypertension Mother    Cervical cancer Mother    Heart disease  Mother    Diabetes Father    Hypertension Father    Heart disease Father    Colitis Maternal Aunt    Diabetes Other        many family members   CVA Maternal Grandmother    Lupus Neg Hx    Sarcoidosis Neg Hx    Pancreatitis Neg Hx    Ataxia Neg Hx    Chorea Neg Hx    Dementia Neg Hx    Mental retardation Neg Hx    Migraines Neg Hx    Multiple sclerosis Neg Hx    Neurofibromatosis Neg Hx    Neuropathy Neg Hx    Parkinsonism Neg Hx    Seizures Neg Hx    Stroke Neg Hx    Colon cancer Neg Hx    Social History   Socioeconomic History   Marital status: Significant Other    Spouse name: Not on file   Number of children: 0   Years of education: Not on file   Highest education level: Not on file  Occupational History   Not on file  Tobacco Use   Smoking status: Former    Packs/day: 0.50    Years: 20.00    Pack years: 10.00    Types: Cigarettes    Quit date: 05/22/2013    Years since quitting: 8.2   Smokeless tobacco: Never  Vaping Use   Vaping Use: Some days   Substances: Nicotine  Substance and Sexual Activity   Alcohol use: No  Alcohol/week: 0.0 standard drinks   Drug use: No   Sexual activity: Never    Birth control/protection: Abstinence  Other Topics Concern   Not on file  Social History Narrative   ** Merged History Encounter **       Lives permanently in [place] with mom and stepfather.  Has not started working yet and was unemployed in [place].         Social Determinants of Health   Financial Resource Strain: Not on file  Food Insecurity: Not on file  Transportation Needs: Not on file  Physical Activity: Not on file  Stress: Not on file  Social Connections: Not on file   Past Surgical History:  Procedure Laterality Date   ABDOMINAL SURGERY  ~ 2007   some sort of pancreatic cyst drainage.    ABDOMINAL SURGERY     ADENOIDECTOMY     CHOLECYSTECTOMY     ~ age 35-laparoscopic   CHOLECYSTECTOMY     EUS N/A 04/14/2013   Procedure: UPPER ENDOSCOPIC  ULTRASOUND (EUS) LINEAR;  Surgeon: Milus Banister, MD;  Location: [facility];  Service: Endoscopy;  Laterality: N/A;   HERNIA REPAIR  2017   ROUX-EN-Y GASTRIC BYPASS     ROUX-EN-Y PROCEDURE     stent to pancreatic cyst that became infected within 36 hours   Past Surgical History:  Procedure Laterality Date   ABDOMINAL SURGERY  ~ 2007   some sort of pancreatic cyst drainage.    ABDOMINAL SURGERY     ADENOIDECTOMY     CHOLECYSTECTOMY     ~ age 30-laparoscopic   CHOLECYSTECTOMY     EUS N/A 04/14/2013   Procedure: UPPER ENDOSCOPIC ULTRASOUND (EUS) LINEAR;  Surgeon: Milus Banister, MD;  Location: [facility];  Service: Endoscopy;  Laterality: N/A;   HERNIA REPAIR  2017   ROUX-EN-Y GASTRIC BYPASS     ROUX-EN-Y PROCEDURE     stent to pancreatic cyst that became infected within 36 hours   Past Medical History:  Diagnosis Date   Abdominal hernia    Adrenal insufficiency ([place]) 04/23/2013   ??   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    Pancreatic cyst 2002 onset   Pancreatitis 03-24-13   2002, 2'2013, 03-14-13   PTSD (post-traumatic stress disorder)    Ventral hernia    BP 126/86 Comment: last recorded blood pressure   Pulse (!) 111 Comment: last recorded pulse   Ht 5\' 3"  (1.6 m)    Wt 251 lb (113.9 kg) Comment: last recorded weight   BMI 44.46 kg/m   Opioid Risk Score:   Fall Risk Score:  `1  Depression screen PHQ 2/9  Depression screen [facility] 2/9 08/05/2021 05/31/2021 03/28/2021 12/19/2020 08/01/2020 03/12/2020 05/18/2019  Decreased Interest 0 1 1 1 1  0 1  Down, Depressed, Hopeless 1 3 1 1 1  0 1  PHQ - 2 Score 1 4 2 2 2  0 2  Altered sleeping - - - - - - -  Tired, decreased energy - - - - - - -  Change in appetite - - - - - - -  Feeling bad or  failure about yourself  - - - - - - -  Trouble concentrating - - - - - - -  Moving slowly or fidgety/restless - - - - - - -  Suicidal thoughts - - - - - - -  PHQ-9 Score - - - - - - -  Difficult doing work/chores - - - - - - -  Some recent data might be hidden     Review of Systems  Constitutional: Negative.   HENT: Negative.    Eyes: Negative.   Respiratory: Negative.    Cardiovascular: Negative.   Gastrointestinal: Negative.   Endocrine: Negative.   Genitourinary: Negative.   Musculoskeletal:  Positive for back pain, gait problem and neck pain.  Skin: Negative.   Allergic/Immunologic: Negative.   Neurological:  Positive for weakness.  Hematological: Negative.   Psychiatric/Behavioral:  Positive for dysphoric mood and sleep disturbance.       Objective:   Physical Exam Vitals and nursing note reviewed.  Constitutional:      Appearance: Normal appearance.  Cardiovascular:     Rate and Rhythm: Normal rate and regular rhythm.     Pulses: Normal pulses.     Heart sounds: Normal heart sounds.  Pulmonary:     Effort: Pulmonary effort is normal.     Breath sounds: Normal breath sounds.  Musculoskeletal:     Cervical back: Normal range of motion and neck supple.     Comments: No Physical Exam Perform : My-Chart Video Visit  Skin:    General: Skin is warm and dry.  Neurological:     Mental Status: He is alert and oriented to person, place, and time.  Psychiatric:        Mood and Affect: Mood normal.        Behavior: Behavior normal.         Assessment & Plan:  1. MS: Neurology Following. Continue to Monitor. 08/05/2021 2. 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 Refilled: Oxycodone 10 mg one tablet every 6 hours as needed. #120. Second script sent for the following month. 08/05/2021. We will continue the opioid monitoring program, this consists of regular clinic visits, examinations, urine drug  screen, pill counts as well as use of [place] Controlled Substance Reporting system. A 12 month History has been reviewed on the [place] Controlled Substance Reporting System on 08/05/2021 3. Low back pain with  Radiculopathy RLE/: Continued  topamax.08/05/2021  4. Chronic pancreatitis: PCP and Gastroenterologist Following. 08/05/2021   5. Insomnia: Continue Pamelor. Continue to Monitor.08/05/2021 6. PTSD: Psychiatry and Dr. Kieth Brightly following. . 08/05/2021. 7. Lupus: Rheumatology Following. 08/05/2021 8. Chronic Migraine/ Vascular Headaches: Continue Imitrex,Topamax and Aimovig.Continue to Monitor. 08/05/2021 9. Right Knee Pain: No complaints today. Continue HEP as Tolerated. Continue to Monitor. 08/05/2021 10. Muscle Spasm: Continue Baclofen/ Robaxin. Continue to Monitor. 08/05/2021 11. Cervicalgia: No complaints today. Continue HEP as tolerated. Continue to monitor. 08/05/2021 12. Polyarthralgia: Continue current medication regimen. Continue to monitor. 08/05/2021 13. Right Sided Weakness: Continue HEP as Tolerated. Continue to Monitor. 08/05/2021   F/U in 2 months. Established Patient My Chart Video Visit Location of Patient: In her Home Location of Provider: In the Office Total Time Spent: 30 Minutes

## 2021-08-05 NOTE — Telephone Encounter (Signed)
I have attempted to call patient three times to get ready for MyChart visit on 08/05/21 at 10:40am. Left voicemail each time to call us back to get ready for visit. Attempted to call mother and she stated she is POA, but patient does not live with her any more.

## 2021-08-14 ENCOUNTER — Telehealth: Payer: Self-pay

## 2021-08-14 NOTE — Telephone Encounter (Signed)
PA for Oxycodone sent to insurance

## 2021-08-15 NOTE — Telephone Encounter (Signed)
Oxycodone Hcl was approved from 08/14/21-02/12/22

## 2021-08-28 ENCOUNTER — Ambulatory Visit: Payer: Medicaid Other | Admitting: Neurology

## 2021-09-05 ENCOUNTER — Telehealth: Payer: Self-pay

## 2021-09-05 NOTE — Telephone Encounter (Signed)
Patient is currently at [facility] and has been there for a while. She is getting discharged and needs personal care service at home but the insurance will not take a referral from the hospital needs to come from PCP or provider who has seen this patient in the last 90 days. Jeraldine Loots is sending over the paperwork to be filled.

## 2021-09-30 ENCOUNTER — Telehealth: Payer: Self-pay

## 2021-09-30 NOTE — Telephone Encounter (Signed)
PA for Aimovig sent to insurance through CoverMyMeds 

## 2021-10-02 NOTE — Telephone Encounter (Signed)
Aimovig approved 09/30/21-09/30/22

## 2021-10-11 ENCOUNTER — Encounter: Payer: Medicaid Other | Attending: Physical Medicine & Rehabilitation | Admitting: Registered Nurse

## 2021-10-11 ENCOUNTER — Encounter: Payer: Self-pay | Admitting: Registered Nurse

## 2021-10-11 ENCOUNTER — Other Ambulatory Visit: Payer: Self-pay

## 2021-10-11 VITALS — BP 95/67 | HR 97 | Ht 63.0 in

## 2021-10-11 DIAGNOSIS — G35 Multiple sclerosis: Secondary | ICD-10-CM | POA: Diagnosis present

## 2021-10-11 DIAGNOSIS — M329 Systemic lupus erythematosus, unspecified: Secondary | ICD-10-CM

## 2021-10-11 DIAGNOSIS — G894 Chronic pain syndrome: Secondary | ICD-10-CM | POA: Diagnosis not present

## 2021-10-11 DIAGNOSIS — Z79899 Other long term (current) drug therapy: Secondary | ICD-10-CM | POA: Diagnosis not present

## 2021-10-11 DIAGNOSIS — M47816 Spondylosis without myelopathy or radiculopathy, lumbar region: Secondary | ICD-10-CM

## 2021-10-11 DIAGNOSIS — Z5181 Encounter for therapeutic drug level monitoring: Secondary | ICD-10-CM | POA: Diagnosis not present

## 2021-10-11 DIAGNOSIS — R531 Weakness: Secondary | ICD-10-CM | POA: Diagnosis present

## 2021-10-11 DIAGNOSIS — F4310 Post-traumatic stress disorder, unspecified: Secondary | ICD-10-CM

## 2021-10-11 DIAGNOSIS — M2550 Pain in unspecified joint: Secondary | ICD-10-CM

## 2021-10-11 DIAGNOSIS — F331 Major depressive disorder, recurrent, moderate: Secondary | ICD-10-CM | POA: Diagnosis present

## 2021-10-11 MED ORDER — OXYCODONE HCL 10 MG PO TABS: 10.0000 mg | ORAL_TABLET | Freq: Four times a day (QID) | ORAL | 0 refills | Status: DC | PRN

## 2021-10-11 MED ORDER — CLONAZEPAM 0.5 MG PO TABS: 0.5000 mg | ORAL_TABLET | Freq: Three times a day (TID) | ORAL | 2 refills | Status: DC | PRN

## 2021-10-11 NOTE — Progress Notes (Signed)
Subjective:    Patient ID: Mary [name], adult    DOB: 1975-10-02, 46 y.o.   MRN: [id number]  HPI: Mary [name] is a 46 y.o. female who returns for follow up appointment for chronic pain and medication refill. He states his pain is located in his lower back,, left lower extremity and right foot foot. Also reports generalized joint pain. He rates his pain 7. His current exercise regime is walking short distances in his home, he arrived in wheelchair today.   Went to [facility] ED on 08/25/2021 for bilateral lower extremities weakness, note was reviewed. Mother states she was hospitalized and they were trying to find an inpatient rehabilitation facility, it was unsuccessful. Mary [name] at this time.   Mary [name] Morphine equivalent is 60.00 MME. He is also prescribed Clonazepam. We have discussed the black box warning of using opioids and benzodiazepines. I highlighted the dangers of using these drugs together and discussed the adverse events including respiratory suppression, overdose, cognitive impairment and importance of compliance with current regimen. We will continue to monitor and adjust as indicated.  he is being closely monitored and under the care of his psychiatry.     Pain Inventory Average Pain 7 Pain Right Now 7 My pain is burning and numbness  In the last 24 hours, has pain interfered with the following? General activity 7 Relation with others 4 Enjoyment of life 7 What TIME of day is your pain at its worst? night Sleep (in general) Fair  Pain is worse with: standing and some activites Pain improves with: rest and medication Relief from Meds: 3  Family History  Problem Relation Age of Onset   Diabetes Mother    Hypertension Mother    Cervical cancer Mother    Heart disease Mother    Diabetes Father    Hypertension Father    Heart disease Father    Colitis Maternal Aunt    Diabetes Other        many family members   CVA Maternal  Grandmother    Lupus Neg Hx    Sarcoidosis Neg Hx    Pancreatitis Neg Hx    Ataxia Neg Hx    Chorea Neg Hx    Dementia Neg Hx    Mental retardation Neg Hx    Migraines Neg Hx    Multiple sclerosis Neg Hx    Neurofibromatosis Neg Hx    Neuropathy Neg Hx    Parkinsonism Neg Hx    Seizures Neg Hx    Stroke Neg Hx    Colon cancer Neg Hx    Social History   Socioeconomic History   Marital status: Significant Other    Spouse name: Not on file   Number of children: 0   Years of education: Not on file   Highest education level: Not on file  Occupational History   Not on file  Tobacco Use   Smoking status: Former    Packs/day: 0.50    Years: 20.00    Pack years: 10.00    Types: Cigarettes    Quit date: 05/22/2013    Years since quitting: 8.3   Smokeless tobacco: Never  Vaping Use   Vaping Use: Some days   Substances: Nicotine  Substance and Sexual Activity   Alcohol use: No    Alcohol/week: 0.0 standard drinks   Drug use: No   Sexual activity: Never    Birth control/protection: Abstinence  Other Topics Concern   Not on  file  Social History Narrative   ** Merged History Encounter **       Lives permanently in [place] with mom and stepfather.  Has not started working yet and was unemployed in [place].         Social Determinants of Health   Financial Resource Strain: Not on file  Food Insecurity: Not on file  Transportation Needs: Not on file  Physical Activity: Not on file  Stress: Not on file  Social Connections: Not on file   Past Surgical History:  Procedure Laterality Date   ABDOMINAL SURGERY  ~ 2007   some sort of pancreatic cyst drainage.    ABDOMINAL SURGERY     ADENOIDECTOMY     CHOLECYSTECTOMY     ~ age 49-laparoscopic   CHOLECYSTECTOMY     EUS N/A 04/14/2013   Procedure: UPPER ENDOSCOPIC ULTRASOUND (EUS) LINEAR;  Surgeon: Milus Banister, MD;  Location: [facility];  Service: Endoscopy;  Laterality: N/A;   HERNIA REPAIR  2017   ROUX-EN-Y GASTRIC  BYPASS     ROUX-EN-Y PROCEDURE     stent to pancreatic cyst that became infected within 36 hours   Past Surgical History:  Procedure Laterality Date   ABDOMINAL SURGERY  ~ 2007   some sort of pancreatic cyst drainage.    ABDOMINAL SURGERY     ADENOIDECTOMY     CHOLECYSTECTOMY     ~ age 75-laparoscopic   CHOLECYSTECTOMY     EUS N/A 04/14/2013   Procedure: UPPER ENDOSCOPIC ULTRASOUND (EUS) LINEAR;  Surgeon: Milus Banister, MD;  Location: [facility];  Service: Endoscopy;  Laterality: N/A;   HERNIA REPAIR  2017   ROUX-EN-Y GASTRIC BYPASS     ROUX-EN-Y PROCEDURE     stent to pancreatic cyst that became infected within 36 hours   Past Medical History:  Diagnosis Date   Abdominal hernia    Adrenal insufficiency ([place]) 04/23/2013   ??   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    Pancreatic cyst 2002 onset   Pancreatitis 03-24-13   2002, 2'2013, 03-14-13   PTSD (post-traumatic stress disorder)    Ventral hernia    BP 95/67    Pulse 97    Ht 5\' 3"  (1.6 m)    SpO2 96%    BMI 44.46 kg/m   Opioid Risk Score:   Fall Risk Score:  `1  Depression screen PHQ 2/9  Depression screen [facility] 2/9 10/11/2021 08/05/2021 05/31/2021 03/28/2021 12/19/2020 08/01/2020 03/12/2020  Decreased Interest 2 0 1 1 1 1  0  Down, Depressed, Hopeless 2 1 3 1 1 1  0  PHQ - 2 Score 4 1 4 2 2 2  0  Altered sleeping - - - - - - -  Tired, decreased energy - - - - - - -  Change in appetite - - - - - - -  Feeling bad or failure about yourself  - - - - - - -  Trouble concentrating - - - - - - -  Moving slowly or fidgety/restless - - - - - - -  Suicidal thoughts - - - - - - -  PHQ-9 Score - - - - - - -  Difficult  doing work/chores - - - - - - -  Some recent data might be hidden      Review of Systems  Constitutional: Negative.   HENT: Negative.    Eyes: Negative.   Respiratory: Negative.    Cardiovascular: Negative.   Gastrointestinal: Negative.   Endocrine: Negative.   Genitourinary: Negative.   Musculoskeletal:  Positive for gait problem.  Skin: Negative.   Allergic/Immunologic: Negative.   Neurological:  Positive for numbness.  Hematological: Negative.   Psychiatric/Behavioral:  Positive for dysphoric mood.       Objective:   Physical Exam Vitals and nursing note reviewed.  Constitutional:      Appearance: Normal appearance.  Cardiovascular:     Rate and Rhythm: Normal rate and regular rhythm.     Pulses: Normal pulses.     Heart sounds: Normal heart sounds.  Pulmonary:     Effort: Pulmonary effort is normal.     Breath sounds: Normal breath sounds.  Musculoskeletal:     Cervical back: Normal range of motion.     Comments: Normal Muscle Bulk and Muscle Testing Reveals:  Upper Extremities: Decreased ROM  45 Degrees and Muscle Strength  3/5 Lumbar Paraspinal Tenderness: L-3-L-5 Bilateral Greater Trochanter Tenderness Lower Extremities: Right: Full ROM and Muscle Strength 4/5 Right Lower Extremity Flexion Produces Pin into his Right Foot Left Lower Extremity: Decreased ROM and Muscle Strength 4/5 Left Lower Extremity Flexion Produces Pain into his left lower extremity Arrived in wheelchair    Skin:    General: Skin is warm and dry.  Neurological:     Mental Status: He is alert and oriented to person, place, and time.  Psychiatric:        Mood and Affect: Mood normal.        Behavior: Behavior normal.         Assessment & Plan:  1. MS: Neurology Following. Continue to Monitor. 10/11/2021 2. 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 Refilled: Oxycodone 10 mg one tablet every 6 hours as needed. #120. Second script sent for the following month. 10/11/2021. We will  continue the opioid monitoring program, this consists of regular clinic visits, examinations, urine drug screen, pill counts as well as use of [place] Controlled Substance Reporting system. A 12 month History has been reviewed on the [place] Controlled Substance Reporting System on 10/11/2021 3. Low back pain with  Radiculopathy RLE/: Continued  topamax.10/11/2021  4. Chronic pancreatitis: PCP and Gastroenterologist Following. 10/11/2021   5. Insomnia: Continue Pamelor. Continue to Monitor.10/11/2021 6. PTSD: Psychiatry and Dr. Sima Matas following. Marland Kitchen 10/11/2021. 7. Lupus: Rheumatology Following. 10/11/2021 8. Chronic Migraine/ Vascular Headaches: Continue Imitrex,Topamax and Aimovig.Continue to Monitor. 10/11/2021 9. Right Knee Pain: No complaints today. Continue HEP as Tolerated. Continue to Monitor. 10/11/2021 10. Muscle Spasm: Continue Baclofen/ Robaxin. Continue to Monitor. 10/11/2021 11. Cervicalgia: No complaints today. Continue HEP as tolerated. Continue to monitor. 10/11/2021 12. Polyarthralgia: Continue current medication regimen. Continue to monitor. 10/11/2021 13. Right Sided Weakness: Continue HEP as Tolerated. Continue to Monitor. 10/11/2021   F/U in 2 months.

## 2021-10-18 LAB — DRUG TOX MONITOR 1 W/CONF, ORAL FLD
Amphetamines: NEGATIVE ng/mL (ref <10)
Barbiturates: NEGATIVE ng/mL (ref <10)
Benzodiazepines: NEGATIVE ng/mL (ref <0.50)
Buprenorphine: NEGATIVE ng/mL (ref <0.10)
Cocaine: NEGATIVE ng/mL (ref <5.0)
Codeine: NEGATIVE ng/mL (ref <2.5)
Cotinine: 208.5 ng/mL — ABNORMAL HIGH (ref <5.0)
Dihydrocodeine: NEGATIVE ng/mL (ref <2.5)
Fentanyl: NEGATIVE ng/mL (ref <0.10)
Heroin Metabolite: NEGATIVE ng/mL (ref <1.0)
Hydrocodone: NEGATIVE ng/mL (ref <2.5)
Hydromorphone: NEGATIVE ng/mL (ref <2.5)
MARIJUANA: NEGATIVE ng/mL (ref <2.5)
MDMA: NEGATIVE ng/mL (ref <10)
Meprobamate: NEGATIVE ng/mL (ref <2.5)
Methadone: NEGATIVE ng/mL (ref <5.0)
Morphine: NEGATIVE ng/mL (ref <2.5)
Naloxone: NEGATIVE ng/mL (ref <0.25)
Nicotine Metabolite: POSITIVE ng/mL — AB (ref <5.0)
Norbuprenorphine: NEGATIVE ng/mL (ref <0.50)
Norhydrocodone: NEGATIVE ng/mL (ref <2.5)
Noroxycodone: 91.8 ng/mL — ABNORMAL HIGH (ref <2.5)
Opiates: POSITIVE ng/mL — AB (ref <2.5)
Oxycodone: >250 ng/mL — ABNORMAL HIGH (ref <2.5)
Oxymorphone: 8.5 ng/mL — ABNORMAL HIGH (ref <2.5)
Phencyclidine: NEGATIVE ng/mL (ref <10)
Tapentadol: NEGATIVE ng/mL (ref <5.0)
Tramadol: NEGATIVE ng/mL (ref <5.0)
Zolpidem: NEGATIVE ng/mL (ref <5.0)

## 2021-10-18 LAB — DRUG TOX ALC METAB W/CON, ORAL FLD: Alcohol Metabolite: NEGATIVE ng/mL (ref <25)

## 2021-10-21 ENCOUNTER — Telehealth: Payer: Self-pay | Admitting: *Deleted

## 2021-10-21 NOTE — Telephone Encounter (Signed)
Oral swab drug screen was consistent for prescribed medications.  ?

## 2021-10-23 ENCOUNTER — Other Ambulatory Visit: Payer: Self-pay | Admitting: Registered Nurse

## 2021-10-23 DIAGNOSIS — G894 Chronic pain syndrome: Secondary | ICD-10-CM

## 2021-10-29 ENCOUNTER — Ambulatory Visit: Payer: Self-pay | Admitting: Neurology

## 2021-10-30 ENCOUNTER — Encounter: Payer: Self-pay | Admitting: Neurology

## 2021-11-18 ENCOUNTER — Other Ambulatory Visit: Payer: Self-pay | Admitting: Registered Nurse

## 2021-11-18 DIAGNOSIS — G43719 Chronic migraine without aura, intractable, without status migrainosus: Secondary | ICD-10-CM

## 2021-11-25 ENCOUNTER — Encounter: Payer: Self-pay | Admitting: Registered Nurse

## 2021-11-25 ENCOUNTER — Encounter: Payer: Medicaid Other | Attending: Physical Medicine & Rehabilitation | Admitting: Registered Nurse

## 2021-11-25 VITALS — BP 104/72 | HR 77 | Ht 63.0 in | Wt 223.8 lb

## 2021-11-25 DIAGNOSIS — M47816 Spondylosis without myelopathy or radiculopathy, lumbar region: Secondary | ICD-10-CM | POA: Insufficient documentation

## 2021-11-25 DIAGNOSIS — G43719 Chronic migraine without aura, intractable, without status migrainosus: Secondary | ICD-10-CM | POA: Diagnosis present

## 2021-11-25 DIAGNOSIS — F4310 Post-traumatic stress disorder, unspecified: Secondary | ICD-10-CM | POA: Insufficient documentation

## 2021-11-25 DIAGNOSIS — Z79899 Other long term (current) drug therapy: Secondary | ICD-10-CM | POA: Diagnosis present

## 2021-11-25 DIAGNOSIS — M2550 Pain in unspecified joint: Secondary | ICD-10-CM | POA: Diagnosis present

## 2021-11-25 DIAGNOSIS — F331 Major depressive disorder, recurrent, moderate: Secondary | ICD-10-CM | POA: Diagnosis present

## 2021-11-25 DIAGNOSIS — G629 Polyneuropathy, unspecified: Secondary | ICD-10-CM | POA: Insufficient documentation

## 2021-11-25 DIAGNOSIS — G35 Multiple sclerosis: Secondary | ICD-10-CM | POA: Diagnosis present

## 2021-11-25 DIAGNOSIS — G894 Chronic pain syndrome: Secondary | ICD-10-CM | POA: Insufficient documentation

## 2021-11-25 DIAGNOSIS — Z5181 Encounter for therapeutic drug level monitoring: Secondary | ICD-10-CM | POA: Diagnosis present

## 2021-11-25 DIAGNOSIS — M329 Systemic lupus erythematosus, unspecified: Secondary | ICD-10-CM | POA: Insufficient documentation

## 2021-11-25 DIAGNOSIS — R531 Weakness: Secondary | ICD-10-CM | POA: Insufficient documentation

## 2021-11-25 MED ORDER — AIMOVIG 140 MG/ML ~~LOC~~ SOAJ: SUBCUTANEOUS | 5 refills | Status: DC

## 2021-11-25 MED ORDER — OXYCODONE HCL 10 MG PO TABS: 10.0000 mg | ORAL_TABLET | Freq: Four times a day (QID) | ORAL | 0 refills | Status: DC | PRN

## 2021-11-25 MED ORDER — CLONAZEPAM 0.5 MG PO TABS: 0.5000 mg | ORAL_TABLET | Freq: Three times a day (TID) | ORAL | 2 refills | Status: DC | PRN

## 2021-11-25 NOTE — Progress Notes (Signed)
? ?Subjective:  ? ? Patient ID: Mary [name], adult    DOB: [birth year], 46 y.o.   MRN: [id number] ? ?HPI: Mary [name] is a 46 y.o. female who returns for follow up appointment for chronic pain and medication refill. He states his pain is located in his bilateral hands with numbness, lower back pain, left lower extremity with tingling and burning. He rates his pain 6. His current exercise regime is walking and performing stretching exercises. ? ?Fluharty Sagan Morphine equivalent is 60.00 MME.He is also prescribed Clonazepam .We have discussed the black box warning of using opioids and benzodiazepines. I highlighted the dangers of using these drugs together and discussed the adverse events including respiratory suppression, overdose, cognitive impairment and importance of compliance with current regimen. We will continue to monitor and adjust as indicated.  ?he is being closely monitored and under the care of his psychiatrist . ? ? Last Oral Swab was Performed on 10/11/2021 ?  ? ?Pain Inventory ?Average Pain 6 ?Pain Right Now 6 ?My pain is intermittent, burning, and stabbing ? ?In the last 24 hours, has pain interfered with the following? ?General activity 5 ?Relation with others 2 ?Enjoyment of life 6 ?What TIME of day is your pain at its worst? morning  ?Sleep (in general) Fair ? ?Pain is worse with: walking and bending ?Pain improves with: rest and medication ?Relief from Meds: 6 ? ?Family History  ?Problem Relation Age of Onset  ? Diabetes Mother   ? Hypertension Mother   ? Cervical cancer Mother   ? Heart disease Mother   ? Diabetes Father   ? Hypertension Father   ? Heart disease Father   ? Colitis Maternal Aunt   ? Diabetes Other   ?     many family members  ? CVA Maternal Grandmother   ? Lupus Neg Hx   ? Sarcoidosis Neg Hx   ? Pancreatitis Neg Hx   ? Ataxia Neg Hx   ? Chorea Neg Hx   ? Dementia Neg Hx   ? Mental retardation Neg Hx   ? Migraines Neg Hx   ? Multiple sclerosis Neg Hx   ? Neurofibromatosis Neg  Hx   ? Neuropathy Neg Hx   ? Parkinsonism Neg Hx   ? Seizures Neg Hx   ? Stroke Neg Hx   ? Colon cancer Neg Hx   ? ?Social History  ? ?Socioeconomic History  ? Marital status: Significant Other  ?  Spouse name: Not on file  ? Number of children: 0  ? Years of education: Not on file  ? Highest education level: Not on file  ?Occupational History  ? Not on file  ?Tobacco Use  ? Smoking status: Former  ?  Packs/day: 0.50  ?  Years: 20.00  ?  Pack years: 10.00  ?  Types: Cigarettes  ?  Quit date: 05/22/2013  ?  Years since quitting: 8.5  ? Smokeless tobacco: Never  ?Vaping Use  ? Vaping Use: Some days  ? Substances: Nicotine  ?Substance and Sexual Activity  ? Alcohol use: No  ?  Alcohol/week: 0.0 standard drinks  ? Drug use: No  ? Sexual activity: Never  ?  Birth control/protection: Abstinence  ?Other Topics Concern  ? Not on file  ?Social History Narrative  ? ** Merged History Encounter **  ?    ? Lives permanently in [place] with mom and stepfather.  Has not started working yet and was unemployed in [place].     ?   ? ?  Social Determinants of Health  ? ?Financial Resource Strain: Not on file  ?Food Insecurity: Not on file  ?Transportation Needs: Not on file  ?Physical Activity: Not on file  ?Stress: Not on file  ?Social Connections: Not on file  ? ?Past Surgical History:  ?Procedure Laterality Date  ? ABDOMINAL SURGERY  ~ 2007  ? some sort of pancreatic cyst drainage.   ? ABDOMINAL SURGERY    ? ADENOIDECTOMY    ? CHOLECYSTECTOMY    ? ~ age 70-laparoscopic  ? CHOLECYSTECTOMY    ? EUS N/A 04/14/2013  ? Procedure: UPPER ENDOSCOPIC ULTRASOUND (EUS) LINEAR;  Surgeon: Rachael Fee, MD;  Location: [facility];  Service: Endoscopy;  Laterality: N/A;  ? HERNIA REPAIR  2017  ? ROUX-EN-Y GASTRIC BYPASS    ? ROUX-EN-Y PROCEDURE    ? stent to pancreatic cyst that became infected within 36 hours  ? ?Past Surgical History:  ?Procedure Laterality Date  ? ABDOMINAL SURGERY  ~ 2007  ? some sort of pancreatic cyst drainage.   ? ABDOMINAL  SURGERY    ? ADENOIDECTOMY    ? CHOLECYSTECTOMY    ? ~ age 70-laparoscopic  ? CHOLECYSTECTOMY    ? EUS N/A 04/14/2013  ? Procedure: UPPER ENDOSCOPIC ULTRASOUND (EUS) LINEAR;  Surgeon: Rachael Fee, MD;  Location: [facility];  Service: Endoscopy;  Laterality: N/A;  ? HERNIA REPAIR  2017  ? ROUX-EN-Y GASTRIC BYPASS    ? ROUX-EN-Y PROCEDURE    ? stent to pancreatic cyst that became infected within 36 hours  ? ?Past Medical History:  ?Diagnosis Date  ? Abdominal hernia   ? Adrenal insufficiency (HCC) 04/23/2013  ? ??  ? Anxiety   ? Depression   ? "recent breakup with partner of 15 yrs"  ? Depression   ? GERD (gastroesophageal reflux disease)   ? Hernia 03-24-13  ? ventral hernia remains   ? Lupus (HCC)   ? MS (multiple sclerosis) (HCC)   ? 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 Pancreatic cyst 2002 onset  ? Pancreatitis 03-24-13  ? 2002, 2'2013, 03-14-13  ? PTSD (post-traumatic stress disorder)   ? Ventral hernia   ? ?There were no vitals taken for this visit. ? ?Opioid Risk Score:   ?Fall Risk Score:  `1 ? ?Depression screen PHQ 2/9 ? ? ?  10/11/2021  ?  9:59 AM 08/05/2021  ? 11:54 AM 05/31/2021  ?  9:12 AM 03/28/2021  ? 11:07 AM 12/19/2020  ? 10:45 AM 08/01/2020  ?  8:51 AM 03/12/2020  ?  9:20 AM  ?Depression screen PHQ 2/9  ?Decreased Interest 2 0 1 1 1 1  0  ?Down, Depressed, Hopeless 2 1 3 1 1 1  0  ?PHQ - 2 Score 4 1 4 2 2 2  0  ?  ? ?Review of Systems  ?Constitutional: Negative.   ?HENT: Negative.    ?Eyes: Negative.   ?Respiratory: Negative.    ?Cardiovascular: Negative.   ?Gastrointestinal: Negative.   ?Endocrine: Negative.   ?Genitourinary: Negative.   ?Musculoskeletal:  Positive for gait problem.  ?Skin: Negative.   ?Allergic/Immunologic: Negative.   ?Hematological: Negative.   ?Psychiatric/Behavioral: Negative.    ? ?   ?Objective:  ? Physical Exam ?Vitals and nursing note reviewed.  ?Constitutional:   ?   Appearance: Normal appearance.  ?Cardiovascular:  ?   Rate and Rhythm: Normal rate and  regular rhythm.  ?   Pulses: Normal pulses.  ?   Heart sounds: Normal  heart sounds.  ?Pulmonary:  ?   Effort: Pulmonary effort is normal.  ?   Breath sounds: Normal breath sounds.  ?Musculoskeletal:  ?   Cervical back: Normal range of motion and neck supple.  ?   Comments: Normal Muscle Bulk and Muscle Testing Reveals:  ?Upper Extremities: Full ROM and Muscle Strength  on Right 3/5 and Left Upper Extremity 2/5 ?Lumbar Paraspinal Tenderness: L-4-L-5 ?Lower Extremities: Right: Full ROM and Muscle Strength 5/5 ?Left Lower Extremity: Decreased ROM and Muscle Strength 5/5 ?Left Lower Extremity Flexion Produces  Pain into his left lower extremity ?Arises from Table Slowly using walker for support ?Narrow Based Gait  ?    ?Skin: ?   General: Skin is warm and dry.  ?Neurological:  ?   Mental Status: He is alert and oriented to person, place, and time.  ?Psychiatric:     ?   Mood and Affect: Mood normal.     ?   Behavior: Behavior normal.  ? ? ? ? ?   ?Assessment & Plan:  ?1. MS: Neurology Following. Continue to Monitor. 004/05/2022 ?2. 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 ?Refilled: Oxycodone 10 mg one tablet every 6 hours as needed. #120. Second script sent for the following month. 11/25/2021. ?We will continue the opioid monitoring program, this consists of regular clinic visits, examinations, urine drug screen, pill counts as well as use of [place] Controlled Substance Reporting system. A 12 month History has been reviewed on the [place] Controlled Substance Reporting System on 11/25/2021 ?3. Low back pain with  Radiculopathy RLE/: Continued  topamax.11/25/2021  ?4. Chronic pancreatitis: PCP and Gastroenterologist Following. 11/25/2021  ? 5. Insomnia: Continue Pamelor. Continue to Monitor.11/25/2021 ?6. PTSD: Psychiatry and Dr. Kieth Brightly following. Marland Kitchen 11/25/2021. ?7. Lupus: Rheumatology Following. 11/25/2021 ?8. Chronic Migraine/  Vascular Headaches: Continue Imitrex,Topamax and Aimovig.Continue to Monitor. 11/25/2021 ?9. Right Knee Pain: No complaints today. Continue HEP as Tolerated. Continue to Monitor. 11/25/2021 ?10. Muscle Sp

## 2022-01-20 ENCOUNTER — Other Ambulatory Visit: Payer: Self-pay | Admitting: Registered Nurse

## 2022-01-20 DIAGNOSIS — G43719 Chronic migraine without aura, intractable, without status migrainosus: Secondary | ICD-10-CM

## 2022-01-20 DIAGNOSIS — M329 Systemic lupus erythematosus, unspecified: Secondary | ICD-10-CM

## 2022-01-20 DIAGNOSIS — R531 Weakness: Secondary | ICD-10-CM

## 2022-01-20 DIAGNOSIS — G35 Multiple sclerosis: Secondary | ICD-10-CM

## 2022-02-12 ENCOUNTER — Encounter: Payer: Self-pay | Admitting: Physical Medicine & Rehabilitation

## 2022-02-12 ENCOUNTER — Encounter: Payer: Medicaid Other | Attending: Physical Medicine & Rehabilitation | Admitting: Physical Medicine & Rehabilitation

## 2022-02-12 VITALS — BP 93/67 | HR 95 | Ht 63.0 in | Wt 218.0 lb

## 2022-02-12 DIAGNOSIS — G609 Hereditary and idiopathic neuropathy, unspecified: Secondary | ICD-10-CM | POA: Insufficient documentation

## 2022-02-12 DIAGNOSIS — Z5181 Encounter for therapeutic drug level monitoring: Secondary | ICD-10-CM | POA: Diagnosis present

## 2022-02-12 DIAGNOSIS — G894 Chronic pain syndrome: Secondary | ICD-10-CM | POA: Insufficient documentation

## 2022-02-12 DIAGNOSIS — R531 Weakness: Secondary | ICD-10-CM | POA: Diagnosis present

## 2022-02-12 DIAGNOSIS — Z79899 Other long term (current) drug therapy: Secondary | ICD-10-CM | POA: Insufficient documentation

## 2022-02-12 DIAGNOSIS — M329 Systemic lupus erythematosus, unspecified: Secondary | ICD-10-CM | POA: Insufficient documentation

## 2022-02-12 MED ORDER — OXYCODONE HCL 10 MG PO TABS: 10.0000 mg | ORAL_TABLET | Freq: Four times a day (QID) | ORAL | 0 refills | Status: DC | PRN

## 2022-02-12 NOTE — Progress Notes (Signed)
Subjective:    Patient ID: Mary [name], adult    DOB: [birth year], 46 y.o.   MRN: [id number]  HPI  This is a follow-up office visit for [facility].  I last saw her in May 2021.  She has been followed most recently by her nurse practitioner.  She has been maintained on oxycodone 10 mg every 6 hours as needed.  Here headaches were increasing until she started zyrtec which reduced them quite a bit. She's getting one severe headache per week   Her mood has been up and down. She lost two friends recently which has been tough for her. She struggles with being dependent at home and "why me". She hasn't had as many disassociative epsiode over the last 2 year. She is taking some supplements which have helped.   She was recently in the hospital for difficulty ambulating.  Apparently they were looking for a rehab facility to address her ambulation.  None was found however.  She complains of increased sensory loss in both legs particularly below the knees but all the way up to the waist.  She denies associated pain.  Strength appears to be grossly unchanged.   Pain Inventory Average Pain 6 Pain Right Now 7 My pain is sharp, burning, and stabbing  In the last 24 hours, has pain interfered with the following? General activity 7 Relation with others 8 Enjoyment of life 10 What TIME of day is your pain at its worst? morning  and night Sleep (in general) Poor  Pain is worse with: walking, bending, and standing Pain improves with: rest and medication Relief from Meds: 6  Family History  Problem Relation Age of Onset   Diabetes Mother    Hypertension Mother    Cervical cancer Mother    Heart disease Mother    Diabetes Father    Hypertension Father    Heart disease Father    Colitis Maternal Aunt    Diabetes Other        many family members   CVA Maternal Grandmother    Lupus Neg Hx    Sarcoidosis Neg Hx    Pancreatitis Neg Hx    Ataxia Neg Hx    Chorea Neg Hx    Dementia Neg Hx     Mental retardation Neg Hx    Migraines Neg Hx    Multiple sclerosis Neg Hx    Neurofibromatosis Neg Hx    Neuropathy Neg Hx    Parkinsonism Neg Hx    Seizures Neg Hx    Stroke Neg Hx    Colon cancer Neg Hx    Social History   Socioeconomic History   Marital status: Significant Other    Spouse name: Not on file   Number of children: 0   Years of education: Not on file   Highest education level: Not on file  Occupational History   Not on file  Tobacco Use   Smoking status: Former    Packs/day: 0.50    Years: 20.00    Total pack years: 10.00    Types: Cigarettes    Quit date: 05/22/2013    Years since quitting: 8.7   Smokeless tobacco: Never  Vaping Use   Vaping Use: Some days   Substances: Nicotine  Substance and Sexual Activity   Alcohol use: No    Alcohol/week: 0.0 standard drinks of alcohol   Drug use: No   Sexual activity: Never    Birth control/protection: Abstinence  Other Topics Concern  Not on file  Social History Narrative   ** Merged History Encounter **       Lives permanently in [place] with mom and stepfather.  Has not started working yet and was unemployed in [place].         Social Determinants of Health   Financial Resource Strain: Not on file  Food Insecurity: Not on file  Transportation Needs: Not on file  Physical Activity: Not on file  Stress: Not on file  Social Connections: Not on file   Past Surgical History:  Procedure Laterality Date   ABDOMINAL SURGERY  ~ 2007   some sort of pancreatic cyst drainage.    ABDOMINAL SURGERY     ADENOIDECTOMY     CHOLECYSTECTOMY     ~ age 61-laparoscopic   CHOLECYSTECTOMY     EUS N/A 04/14/2013   Procedure: UPPER ENDOSCOPIC ULTRASOUND (EUS) LINEAR;  Surgeon: Rachael Fee, MD;  Location: [facility];  Service: Endoscopy;  Laterality: N/A;   HERNIA REPAIR  2017   ROUX-EN-Y GASTRIC BYPASS     ROUX-EN-Y PROCEDURE     stent to pancreatic cyst that became infected within 36 hours   Past Surgical  History:  Procedure Laterality Date   ABDOMINAL SURGERY  ~ 2007   some sort of pancreatic cyst drainage.    ABDOMINAL SURGERY     ADENOIDECTOMY     CHOLECYSTECTOMY     ~ age 61-laparoscopic   CHOLECYSTECTOMY     EUS N/A 04/14/2013   Procedure: UPPER ENDOSCOPIC ULTRASOUND (EUS) LINEAR;  Surgeon: Rachael Fee, MD;  Location: [facility];  Service: Endoscopy;  Laterality: N/A;   HERNIA REPAIR  2017   ROUX-EN-Y GASTRIC BYPASS     ROUX-EN-Y PROCEDURE     stent to pancreatic cyst that became infected within 36 hours   Past Medical History:  Diagnosis Date   Abdominal hernia    Adrenal insufficiency (HCC) 04/23/2013   ??   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    Pancreatic cyst 2002 onset   Pancreatitis 03-24-13   2002, 2'2013, 03-14-13   PTSD (post-traumatic stress disorder)    Ventral hernia    BP 93/67   Pulse 95   Ht 5\' 3"  (1.6 m)   Wt 218 lb (98.9 kg)   SpO2 96%   BMI 38.62 kg/m   Opioid Risk Score:   Fall Risk Score:  `1  Depression screen [facility] 2/9     11/25/2021   10:12 AM 10/11/2021    9:59 AM 08/05/2021   11:54 AM 05/31/2021    9:12 AM 03/28/2021   11:07 AM 12/19/2020   10:45 AM 08/01/2020    8:51 AM  Depression screen PHQ 2/9  Decreased Interest 3 2 0 1 1 1 1   Down, Depressed, Hopeless 3 2 1 3 1 1 1   PHQ - 2 Score 6 4 1 4 2 2 2       Review of Systems  Musculoskeletal:        Bilateral hand pain Bilateral lower leg pain        Objective:   Physical Exam General: No acute distress HEENT: NCAT, EOMI, oral membranes moist Cards: reg rate  Chest:  normal effort Abdomen: Soft, NT, ND Skin: dry, intact Extremities: no edema Psych: pleasant but slightly flat Skin: intact, tattoos Neuro: Patient demonstrates fair  insight and awareness.  Cranial nerve exam was nonfocal today.  Normal language and speech.  Moves all 4 with 4-5 out of 5 strength.  She has decreased sensation below the waist but really is almost insensate below the knees.  She walks with a wide-based gait with legs rotated externally.  Uses a rolling walker for support. Musculoskeletal: slight pain in upper cervical paraspinals and occiput. Fair neck rom.             Assessment & Plan:    Assessment & Plan:   1. Non-specific bicerebral/brainstem white matter disease---initially suspected to be MS, but not characteristic in presentation. Likely SLE.               -continued memory and balance deficits.   -Demonstrates decreased light touch and pain sense below the waist, particularly below the knees bilaterally. 2. Chronic pain syndrome related to above with primarily neuropathic right sided pain. She has had a recent increase in cervical pain and related headaches over the last several months as well.  3. Low back pain with symptoms more in the left low back and buttock.  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 Chronic pancreatitis  5. Conversion disorder/PTSD---at the core of many of her problems it appears 6. Chronic migraines/vascular headaches: recently increased. Unclear as to why     Plan:  1. Continue topamax 100mg  am and 100mg  qpm for headaches and neuropathic pain.              -maintain imitrex to 100mg  for breakthrough migraine             -continue aimovig at 140mg  per month           - pamelor to 50mg  ahs           -Consider Nurtec for breakthrough headaches if needed.  It seems that her allergy remedy has helped a lot with her current headache patterns. 2. Continue psychiatry follow up. Many of her neurological symptoms are likely non-organic in nature given her recent spontaneous improvement and relapse. Pt/family aware             -  pamelor   25mg --maintain at 50mg  qhs             -recommend f/u with psychology in addition to psychiatry.                - 3. Oxycodone was refilled. #120 10mg  was refilled today.   We will continue the controlled substance monitoring program, this consists of regular clinic visits, examinations, routine drug screening, pill counts as well as use of Controlled Substance Reporting System. [place].    4.  Insomnia:   nortriptyline  4.  Sleep hygiene: making some improvements here 7.  Discussed referral to outpatient physical therapy to address balance and gait.  She will let me know when she has transportation available.   30 minutes of face to face patient care time were spent during this visit. All questions were encouraged and answered.  Follow up with NP in 2 mos .

## 2022-02-12 NOTE — Patient Instructions (Signed)
PLEASE FEEL FREE TO CALL OUR OFFICE WITH ANY PROBLEMS OR QUESTIONS [phone])     IT WOULD BE A GOOD IDEA TO RESUME PSYCHOLOGY FOLLOW UP TO WORK ON YOUR MOOD.

## 2022-02-28 ENCOUNTER — Other Ambulatory Visit: Payer: Self-pay | Admitting: Physical Medicine & Rehabilitation

## 2022-02-28 DIAGNOSIS — M329 Systemic lupus erythematosus, unspecified: Secondary | ICD-10-CM

## 2022-02-28 DIAGNOSIS — R531 Weakness: Secondary | ICD-10-CM

## 2022-02-28 DIAGNOSIS — Z79899 Other long term (current) drug therapy: Secondary | ICD-10-CM

## 2022-02-28 DIAGNOSIS — G894 Chronic pain syndrome: Secondary | ICD-10-CM

## 2022-02-28 DIAGNOSIS — Z5181 Encounter for therapeutic drug level monitoring: Secondary | ICD-10-CM

## 2022-02-28 MED ORDER — OXYCODONE HCL 10 MG PO TABS: 10.0000 mg | ORAL_TABLET | Freq: Four times a day (QID) | ORAL | 0 refills | Status: DC | PRN

## 2022-02-28 NOTE — Telephone Encounter (Signed)
PMP was Reviewed. Dr Riley Kill note was reviewed.  Oxycodone e-scribed today Roxy is aware of the above via My-Chart

## 2022-03-08 ENCOUNTER — Other Ambulatory Visit: Payer: Self-pay | Admitting: Registered Nurse

## 2022-03-08 DIAGNOSIS — G43719 Chronic migraine without aura, intractable, without status migrainosus: Secondary | ICD-10-CM

## 2022-04-11 ENCOUNTER — Encounter: Payer: Self-pay | Admitting: Registered Nurse

## 2022-04-11 ENCOUNTER — Encounter: Payer: Medicaid Other | Attending: Physical Medicine & Rehabilitation | Admitting: Registered Nurse

## 2022-04-11 VITALS — BP 108/77 | HR 100 | Ht 63.0 in | Wt 213.8 lb

## 2022-04-11 DIAGNOSIS — M545 Low back pain, unspecified: Secondary | ICD-10-CM

## 2022-04-11 DIAGNOSIS — F4310 Post-traumatic stress disorder, unspecified: Secondary | ICD-10-CM

## 2022-04-11 DIAGNOSIS — G894 Chronic pain syndrome: Secondary | ICD-10-CM

## 2022-04-11 DIAGNOSIS — M2550 Pain in unspecified joint: Secondary | ICD-10-CM | POA: Diagnosis present

## 2022-04-11 DIAGNOSIS — G43719 Chronic migraine without aura, intractable, without status migrainosus: Secondary | ICD-10-CM | POA: Diagnosis present

## 2022-04-11 DIAGNOSIS — Z5181 Encounter for therapeutic drug level monitoring: Secondary | ICD-10-CM

## 2022-04-11 DIAGNOSIS — G609 Hereditary and idiopathic neuropathy, unspecified: Secondary | ICD-10-CM | POA: Diagnosis present

## 2022-04-11 DIAGNOSIS — R531 Weakness: Secondary | ICD-10-CM | POA: Diagnosis present

## 2022-04-11 DIAGNOSIS — G35D Multiple sclerosis, unspecified: Secondary | ICD-10-CM

## 2022-04-11 DIAGNOSIS — M47816 Spondylosis without myelopathy or radiculopathy, lumbar region: Secondary | ICD-10-CM

## 2022-04-11 DIAGNOSIS — M329 Systemic lupus erythematosus, unspecified: Secondary | ICD-10-CM | POA: Diagnosis present

## 2022-04-11 DIAGNOSIS — Z79899 Other long term (current) drug therapy: Secondary | ICD-10-CM

## 2022-04-11 DIAGNOSIS — G35 Multiple sclerosis: Secondary | ICD-10-CM | POA: Diagnosis present

## 2022-04-11 DIAGNOSIS — G8929 Other chronic pain: Secondary | ICD-10-CM

## 2022-04-11 DIAGNOSIS — F331 Major depressive disorder, recurrent, moderate: Secondary | ICD-10-CM

## 2022-04-11 MED ORDER — CLONAZEPAM 0.5 MG PO TABS: 0.5000 mg | ORAL_TABLET | Freq: Three times a day (TID) | ORAL | 2 refills | Status: DC | PRN

## 2022-04-11 MED ORDER — BACLOFEN 10 MG PO TABS: ORAL_TABLET | ORAL | 2 refills | Status: DC

## 2022-04-11 MED ORDER — OXYCODONE HCL 10 MG PO TABS
10.0000 mg | ORAL_TABLET | Freq: Four times a day (QID) | ORAL | 0 refills | Status: DC | PRN
Start: 2022-04-11 — End: 2022-04-11

## 2022-04-11 MED ORDER — OXYCODONE HCL 10 MG PO TABS
10.0000 mg | ORAL_TABLET | Freq: Four times a day (QID) | ORAL | 0 refills | Status: DC | PRN
Start: 2022-04-11 — End: 2022-06-27

## 2022-04-11 MED ORDER — TOPIRAMATE 100 MG PO TABS: ORAL_TABLET | ORAL | 2 refills | Status: DC

## 2022-04-11 MED ORDER — NORTRIPTYLINE HCL 50 MG PO CAPS: ORAL_CAPSULE | ORAL | 1 refills | Status: DC

## 2022-04-11 MED ORDER — AIMOVIG 140 MG/ML ~~LOC~~ SOAJ: SUBCUTANEOUS | 5 refills | Status: DC

## 2022-04-11 MED ORDER — SUMATRIPTAN SUCCINATE 100 MG PO TABS: ORAL_TABLET | ORAL | 1 refills | Status: DC

## 2022-04-11 NOTE — Progress Notes (Signed)
Subjective:    Patient ID: Mary [name], adult    DOB: 01/30/1976, 46 y.o.   MRN: [id number]  HPI: Mary [name] is a 46 y.o. female who returns for follow up appointment for chronic pain and medication refill. He states his  pain is located in his lower back, bilateral hands and bilateral feet with numbness and tingling and generalized joint pain. He also reports he has a headache states the headache is constant. Marland Kitchen He rates his pain 4. His current exercise regime is walking and performing stretching exercises.  [name] Morphine equivalent is 60.00 MME.   Oral Swab was Performed today.    Pain Inventory Average Pain 6 Pain Right Now 4 My pain is sharp, burning, stabbing, tingling, and aching  In the last 24 hours, has pain interfered with the following? General activity 7 Relation with others 6 Enjoyment of life 8 What TIME of day is your pain at its worst? morning  Sleep (in general) Fair  Pain is worse with: walking, standing, and some activites Pain improves with: medication Relief from Meds: 7  Family History  Problem Relation Age of Onset   Diabetes Mother    Hypertension Mother    Cervical cancer Mother    Heart disease Mother    Diabetes Father    Hypertension Father    Heart disease Father    Colitis Maternal Aunt    Diabetes Other        many family members   CVA Maternal Grandmother    Lupus Neg Hx    Sarcoidosis Neg Hx    Pancreatitis Neg Hx    Ataxia Neg Hx    Chorea Neg Hx    Dementia Neg Hx    Mental retardation Neg Hx    Migraines Neg Hx    Multiple sclerosis Neg Hx    Neurofibromatosis Neg Hx    Neuropathy Neg Hx    Parkinsonism Neg Hx    Seizures Neg Hx    Stroke Neg Hx    Colon cancer Neg Hx    Social History   Socioeconomic History   Marital status: Significant Other    Spouse name: Not on file   Number of children: 0   Years of education: Not on file   Highest education level: Not on file  Occupational History   Not on file  Tobacco  Use   Smoking status: Former    Packs/day: 0.50    Years: 20.00    Total pack years: 10.00    Types: Cigarettes    Quit date: 05/22/2013    Years since quitting: 8.8   Smokeless tobacco: Never  Vaping Use   Vaping Use: Some days   Substances: Nicotine  Substance and Sexual Activity   Alcohol use: No    Alcohol/week: 0.0 standard drinks of alcohol   Drug use: No   Sexual activity: Never    Birth control/protection: Abstinence  Other Topics Concern   Not on file  Social History Narrative   ** Merged History Encounter **       Lives permanently in [place] with mom and stepfather.  Has not started working yet and was unemployed in [place].         Social Determinants of Health   Financial Resource Strain: Not on file  Food Insecurity: Not on file  Transportation Needs: Not on file  Physical Activity: Not on file  Stress: Not on file  Social Connections: Not on file   Past  Surgical History:  Procedure Laterality Date   ABDOMINAL SURGERY  ~ 2007   some sort of pancreatic cyst drainage.    ABDOMINAL SURGERY     ADENOIDECTOMY     CHOLECYSTECTOMY     ~ age 79-laparoscopic   CHOLECYSTECTOMY     EUS N/A 04/14/2013   Procedure: UPPER ENDOSCOPIC ULTRASOUND (EUS) LINEAR;  Surgeon: Mary Fee, MD;  Location: [facility];  Service: Endoscopy;  Laterality: N/A;   HERNIA REPAIR  2017   ROUX-EN-Y GASTRIC BYPASS     ROUX-EN-Y PROCEDURE     stent to pancreatic cyst that became infected within 36 hours   Past Surgical History:  Procedure Laterality Date   ABDOMINAL SURGERY  ~ 2007   some sort of pancreatic cyst drainage.    ABDOMINAL SURGERY     ADENOIDECTOMY     CHOLECYSTECTOMY     ~ age 79-laparoscopic   CHOLECYSTECTOMY     EUS N/A 04/14/2013   Procedure: UPPER ENDOSCOPIC ULTRASOUND (EUS) LINEAR;  Surgeon: Mary Fee, MD;  Location: [facility];  Service: Endoscopy;  Laterality: N/A;   HERNIA REPAIR  2017   ROUX-EN-Y GASTRIC BYPASS     ROUX-EN-Y PROCEDURE     stent  to pancreatic cyst that became infected within 36 hours   Past Medical History:  Diagnosis Date   Abdominal hernia    Adrenal insufficiency (HCC) 04/23/2013   ??   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    Pancreatic cyst 2002 onset   Pancreatitis 03-24-13   2002, 2'2013, 03-14-13   PTSD (post-traumatic stress disorder)    Ventral hernia    BP 108/77   Pulse (!) 107   Ht 5\' 3"  (1.6 m)   Wt 213 lb 12.8 oz (97 kg)   SpO2 97%   BMI 37.87 kg/m   Opioid Risk Score:   Fall Risk Score:  `1  Depression screen [facility] 2/9     04/11/2022   10:10 AM 11/25/2021   10:12 AM 10/11/2021    9:59 AM 08/05/2021   11:54 AM 05/31/2021    9:12 AM 03/28/2021   11:07 AM 12/19/2020   10:45 AM  Depression screen PHQ 2/9  Decreased Interest 0 3 2 0 1 1 1   Down, Depressed, Hopeless 0 3 2 1 3 1 1   PHQ - 2 Score 0 6 4 1 4 2 2       Review of Systems  Musculoskeletal:  Positive for back pain.       Left hand and leg pain   All other systems reviewed and are negative.     Objective:   Physical Exam Vitals and nursing note reviewed.  Constitutional:      Appearance: Normal appearance.  Cardiovascular:     Rate and Rhythm: Normal rate and regular rhythm.     Pulses: Normal pulses.     Heart sounds: Normal heart sounds.  Pulmonary:     Effort: Pulmonary effort is normal.     Breath sounds: Normal breath sounds.  Musculoskeletal:     Cervical back: Normal range of motion and neck supple.     Comments: Normal Muscle Bulk and Muscle Testing Reveals:  Upper Extremities: Full  ROM and Muscle Strength 4/5  Lumbar Paraspinal Tenderness: L-4-L-5 Lower Extremities: Full ROM and Muscle Strength 5/5 Arises from Table slowly using walker for support Antalgic  Gait      Skin:    General: Skin is warm and dry.  Neurological:     Mental Status: He is alert and oriented to person, place, and time.  Psychiatric:        Mood and Affect: Mood normal.        Behavior: Behavior normal.         Assessment & Plan:  1. MS: Neurology Following. Continue to Monitor. 04/11/2022 2. 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 Refilled: Oxycodone 10 mg one tablet every 6 hours as needed. #120. Second script sent for the following month. 04/11/2022. We will continue the opioid monitoring program, this consists of regular clinic visits, examinations, urine drug screen, pill counts as well as use of [place] Controlled Substance Reporting system. A 12 month History has been reviewed on the [place] Controlled Substance Reporting System on 11/25/2021 3. Chronic Low Back pain without sciatica/ Low back pain with  Radiculopathy RLE/: Continued  topamax.04/11/2022  4. Chronic pancreatitis: PCP and Gastroenterologist Following. 04/11/2022   5. Insomnia: Continue Pamelor. Continue to Monitor.04/11/2022 6. PTSD: Psychiatry and Dr. Kieth Brightly following. Marland Kitchen 04/11/2022. 7. Lupus: Rheumatology Following. 04/11/2022 8. Chronic Migraine/ Vascular Headaches: Continue Imitrex,Topamax and Aimovig.Continue to Monitor. 04/11/2022 9. Right Knee Pain: No complaints today. Continue HEP as Tolerated. Continue to Monitor. 04/11/2022 10. Muscle Spasm: Continue Baclofen/ Robaxin. Continue to Monitor. 04/11/2022 11. Cervicalgia: No complaints today. Continue HEP as tolerated. Continue to monitor. 04/11/2022 12. Polyarthralgia: Continue current medication regimen. Continue to monitor. 04/11/2022 13. Right Sided Weakness: Continue HEP as Tolerated. Continue to Monitor. 04/11/2022   F/U in 2 months.

## 2022-04-18 ENCOUNTER — Telehealth: Payer: Self-pay | Admitting: *Deleted

## 2022-04-18 LAB — DRUG TOX MONITOR 1 W/CONF, ORAL FLD
Alprazolam: NEGATIVE ng/mL (ref <0.50)
Amphetamines: NEGATIVE ng/mL (ref <10)
Barbiturates: NEGATIVE ng/mL (ref <10)
Benzodiazepines: POSITIVE ng/mL — AB (ref <0.50)
Buprenorphine: NEGATIVE ng/mL (ref <0.10)
Chlordiazepoxide: NEGATIVE ng/mL (ref <0.50)
Clonazepam: 0.75 ng/mL — ABNORMAL HIGH (ref <0.50)
Cocaine: NEGATIVE ng/mL (ref <5.0)
Codeine: NEGATIVE ng/mL (ref <2.5)
Cotinine: 151 ng/mL — ABNORMAL HIGH (ref <5.0)
Diazepam: NEGATIVE ng/mL (ref <0.50)
Dihydrocodeine: NEGATIVE ng/mL (ref <2.5)
Fentanyl: NEGATIVE ng/mL (ref <0.10)
Flunitrazepam: NEGATIVE ng/mL (ref <0.50)
Flurazepam: NEGATIVE ng/mL (ref <0.50)
Heroin Metabolite: NEGATIVE ng/mL (ref <1.0)
Hydrocodone: NEGATIVE ng/mL (ref <2.5)
Hydromorphone: NEGATIVE ng/mL (ref <2.5)
Lorazepam: NEGATIVE ng/mL (ref <0.50)
MARIJUANA: NEGATIVE ng/mL (ref <2.5)
MDMA: NEGATIVE ng/mL (ref <10)
Meprobamate: NEGATIVE ng/mL (ref <2.5)
Methadone: NEGATIVE ng/mL (ref <5.0)
Midazolam: NEGATIVE ng/mL (ref <0.50)
Morphine: NEGATIVE ng/mL (ref <2.5)
Nicotine Metabolite: POSITIVE ng/mL — AB (ref <5.0)
Nordiazepam: NEGATIVE ng/mL (ref <0.50)
Norhydrocodone: NEGATIVE ng/mL (ref <2.5)
Noroxycodone: 48.9 ng/mL — ABNORMAL HIGH (ref <2.5)
Opiates: POSITIVE ng/mL — AB (ref <2.5)
Oxazepam: NEGATIVE ng/mL (ref <0.50)
Oxycodone: >250 ng/mL — ABNORMAL HIGH (ref <2.5)
Oxymorphone: 4.5 ng/mL — ABNORMAL HIGH (ref <2.5)
Phencyclidine: NEGATIVE ng/mL (ref <10)
Tapentadol: NEGATIVE ng/mL (ref <5.0)
Temazepam: NEGATIVE ng/mL (ref <0.50)
Tramadol: NEGATIVE ng/mL (ref <5.0)
Triazolam: NEGATIVE ng/mL (ref <0.50)
Zolpidem: NEGATIVE ng/mL (ref <5.0)

## 2022-04-18 LAB — DRUG TOX ALC METAB W/CON, ORAL FLD: Alcohol Metabolite: NEGATIVE ng/mL (ref <25)

## 2022-04-18 NOTE — Telephone Encounter (Signed)
Oral swab drug screen was consistent for prescribed medications.  ?

## 2022-06-09 ENCOUNTER — Encounter: Payer: Medicaid Other | Admitting: Registered Nurse

## 2022-06-27 ENCOUNTER — Encounter: Payer: Medicaid Other | Attending: Physical Medicine & Rehabilitation | Admitting: Registered Nurse

## 2022-06-27 ENCOUNTER — Encounter: Payer: Self-pay | Admitting: Registered Nurse

## 2022-06-27 VITALS — BP 107/73 | HR 76 | Ht 63.0 in | Wt 217.4 lb

## 2022-06-27 DIAGNOSIS — G609 Hereditary and idiopathic neuropathy, unspecified: Secondary | ICD-10-CM | POA: Diagnosis not present

## 2022-06-27 DIAGNOSIS — Z79899 Other long term (current) drug therapy: Secondary | ICD-10-CM | POA: Insufficient documentation

## 2022-06-27 DIAGNOSIS — M329 Systemic lupus erythematosus, unspecified: Secondary | ICD-10-CM | POA: Diagnosis not present

## 2022-06-27 DIAGNOSIS — G35 Multiple sclerosis: Secondary | ICD-10-CM | POA: Diagnosis not present

## 2022-06-27 DIAGNOSIS — G894 Chronic pain syndrome: Secondary | ICD-10-CM | POA: Diagnosis present

## 2022-06-27 DIAGNOSIS — F331 Major depressive disorder, recurrent, moderate: Secondary | ICD-10-CM | POA: Diagnosis present

## 2022-06-27 DIAGNOSIS — Z5181 Encounter for therapeutic drug level monitoring: Secondary | ICD-10-CM | POA: Diagnosis present

## 2022-06-27 DIAGNOSIS — M47816 Spondylosis without myelopathy or radiculopathy, lumbar region: Secondary | ICD-10-CM | POA: Diagnosis present

## 2022-06-27 DIAGNOSIS — R531 Weakness: Secondary | ICD-10-CM | POA: Insufficient documentation

## 2022-06-27 MED ORDER — OXYCODONE HCL 10 MG PO TABS: 10.0000 mg | ORAL_TABLET | Freq: Four times a day (QID) | ORAL | 0 refills | Status: DC | PRN

## 2022-06-27 MED ORDER — METHOCARBAMOL 500 MG PO TABS: 500.0000 mg | ORAL_TABLET | Freq: Every day | ORAL | 1 refills | Status: DC

## 2022-06-27 NOTE — Progress Notes (Unsigned)
Subjective:    Patient ID: Mary [name], adult    DOB: [birth year], 46 y.o.   MRN: [id number]  HPI: Mary [name] is a 46 y.o. female who returns for follow up appointment for chronic pain and medication refill. He states his pain is located in his bilateral shoulders, lower back, bilateral knees, bilateral lower extremities and bilateral feet. He also reports he has a headache this morning. Marland Kitchen He rates his pain 6. His current exercise regime is walking with walker  Rolly Salter Faivre Morphine equivalent is 60.00 MME. He  is also prescribed Clnazepam. .We have discussed the black box warning of using opioids and benzodiazepines. I highlighted the dangers of using these drugs together and discussed the adverse events including respiratory suppression, overdose, cognitive impairment and importance of compliance with current regimen. We will continue to monitor and adjust as indicated.  he is being closely monitored and under the care of his psychiatrist.  Last UDS was Performed on 04/11/2022, it was consistent.    Pain Inventory Average Pain 8 Pain Right Now 6 My pain is constant, sharp, burning, dull, stabbing, tingling, and aching  In the last 24 hours, has pain interfered with the following? General activity 9 Relation with others 0 Enjoyment of life 9 What TIME of day is your pain at its worst? morning  and night Sleep (in general) Good  Pain is worse with: walking, bending, sitting, standing, and some activites Pain improves with: rest and medication Relief from Meds: 6  Family History  Problem Relation Age of Onset   Diabetes Mother    Hypertension Mother    Cervical cancer Mother    Heart disease Mother    Diabetes Father    Hypertension Father    Heart disease Father    Colitis Maternal Aunt    Diabetes Other        many family members   CVA Maternal Grandmother    Lupus Neg Hx    Sarcoidosis Neg Hx    Pancreatitis Neg Hx    Ataxia Neg Hx    Chorea Neg Hx    Dementia  Neg Hx    Mental retardation Neg Hx    Migraines Neg Hx    Multiple sclerosis Neg Hx    Neurofibromatosis Neg Hx    Neuropathy Neg Hx    Parkinsonism Neg Hx    Seizures Neg Hx    Stroke Neg Hx    Colon cancer Neg Hx    Social History   Socioeconomic History   Marital status: Significant Other    Spouse name: Not on file   Number of children: 0   Years of education: Not on file   Highest education level: Not on file  Occupational History   Not on file  Tobacco Use   Smoking status: Former    Packs/day: 0.50    Years: 20.00    Total pack years: 10.00    Types: Cigarettes    Quit date: 05/22/2013    Years since quitting: 9.1   Smokeless tobacco: Never  Vaping Use   Vaping Use: Some days   Substances: Nicotine  Substance and Sexual Activity   Alcohol use: No    Alcohol/week: 0.0 standard drinks of alcohol   Drug use: No   Sexual activity: Never    Birth control/protection: Abstinence  Other Topics Concern   Not on file  Social History Narrative   ** Merged History Encounter **       Lives  permanently in [place] with mom and stepfather.  Has not started working yet and was unemployed in [place].         Social Determinants of Health   Financial Resource Strain: Not on file  Food Insecurity: Not on file  Transportation Needs: Not on file  Physical Activity: Not on file  Stress: Not on file  Social Connections: Not on file   Past Surgical History:  Procedure Laterality Date   ABDOMINAL SURGERY  ~ 2007   some sort of pancreatic cyst drainage.    ABDOMINAL SURGERY     ADENOIDECTOMY     CHOLECYSTECTOMY     ~ age 7-laparoscopic   CHOLECYSTECTOMY     EUS N/A 04/14/2013   Procedure: UPPER ENDOSCOPIC ULTRASOUND (EUS) LINEAR;  Surgeon: Rachael Fee, MD;  Location: [facility];  Service: Endoscopy;  Laterality: N/A;   HERNIA REPAIR  2017   ROUX-EN-Y GASTRIC BYPASS     ROUX-EN-Y PROCEDURE     stent to pancreatic cyst that became infected within 36 hours   Past  Surgical History:  Procedure Laterality Date   ABDOMINAL SURGERY  ~ 2007   some sort of pancreatic cyst drainage.    ABDOMINAL SURGERY     ADENOIDECTOMY     CHOLECYSTECTOMY     ~ age 7-laparoscopic   CHOLECYSTECTOMY     EUS N/A 04/14/2013   Procedure: UPPER ENDOSCOPIC ULTRASOUND (EUS) LINEAR;  Surgeon: Rachael Fee, MD;  Location: [facility];  Service: Endoscopy;  Laterality: N/A;   HERNIA REPAIR  2017   ROUX-EN-Y GASTRIC BYPASS     ROUX-EN-Y PROCEDURE     stent to pancreatic cyst that became infected within 36 hours   Past Medical History:  Diagnosis Date   Abdominal hernia    Adrenal insufficiency (HCC) 04/23/2013   ??   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    Pancreatic cyst 2002 onset   Pancreatitis 03-24-13   2002, 2'2013, 03-14-13   PTSD (post-traumatic stress disorder)    Ventral hernia    BP 107/73   Pulse 76   Ht 5\' 3"  (1.6 m)   Wt 217 lb 6.4 oz (98.6 kg)   SpO2 97%   BMI 38.51 kg/m   Opioid Risk Score:   Fall Risk Score:  `1  Depression screen PHQ 2/9     06/27/2022   10:00 AM 04/11/2022   10:10 AM 11/25/2021   10:12 AM 10/11/2021    9:59 AM 08/05/2021   11:54 AM 05/31/2021    9:12 AM 03/28/2021   11:07 AM  Depression screen PHQ 2/9  Decreased Interest 0 0 3 2 0 1 1  Down, Depressed, Hopeless 0 0 3 2 1 3 1   PHQ - 2 Score 0 0 6 4 1 4 2       Review of Systems  Musculoskeletal:  Positive for back pain, gait problem and neck pain.       B/L hand pain B/L leg pain  All other systems reviewed and are negative.     Objective:   Physical Exam Vitals and nursing note reviewed.  Constitutional:      Appearance: Normal appearance.  Cardiovascular:     Rate  and Rhythm: Normal rate and regular rhythm.      Pulses: Normal pulses.     Heart sounds: Normal heart sounds.  Pulmonary:     Effort: Pulmonary effort is normal.     Breath sounds: Normal breath sounds.  Musculoskeletal:     Cervical back: Normal range of motion and neck supple.     Comments: Normal Muscle Bulk and Muscle Testing Reveals:  Upper Extremities: Full ROM and Muscle Strength 5/5 , Lumbar Paraspinal Tenderness: L-4-L-5 Lower Extremities: Full ROM and Muscle Strength 5/5 Arises from Table slowly using walker for support Narrow Based  Gait     Skin:    General: Skin is warm and dry.  Neurological:     Mental Status: He is alert and oriented to person, place, and time.  Psychiatric:        Mood and Affect: Mood normal.        Behavior: Behavior normal.         Assessment & Plan:  1. MS: Neurology Following. Continue to Monitor. 06/27/2022 2. 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 Refilled: Oxycodone 10 mg one tablet every 6 hours as needed. #120. Second script sent for the following month. 06/27/2022. We will continue the opioid monitoring program, this consists of regular clinic visits, examinations, urine drug screen, pill counts as well as use of [place] Controlled Substance Reporting system. A 12 month History has been reviewed on the [place] Controlled Substance Reporting System on 06/27/2022 3. Chronic Low Back pain without sciatica/ Low back pain with  Radiculopathy RLE/: Continued  topamax.06/27/2022  4. Chronic pancreatitis: PCP and Gastroenterologist Following. 06/27/2022   5. Insomnia: Continue Pamelor. Continue to Monitor.06/27/2022 6. PTSD: Psychiatry and Dr. Kieth Brightly following. . 06/27/2022. 7. Lupus: Rheumatology Following. 06/27/2022 8. Chronic Migraine/ Vascular Headaches: Continue Imitrex,Topamax and Aimovig.Continue to Monitor. 06/27/2022 9. Right Knee Pain: No complaints today. Continue HEP as Tolerated.  Continue to Monitor. 06/27/2022 10. Muscle Spasm: Continue Baclofen/ Robaxin. Continue to Monitor. 06/27/2022 11. Cervicalgia: No complaints today. Continue HEP as tolerated. Continue to monitor. 06/27/2022 12. Polyarthralgia: Continue current medication regimen. Continue to monitor. 06/27/2022 13. Right Sided Weakness: Continue HEP as Tolerated. Continue to Monitor. 06/27/2022   F/U in 2 months.

## 2022-07-31 ENCOUNTER — Telehealth: Payer: Self-pay | Admitting: Registered Nurse

## 2022-07-31 MED ORDER — ALPRAZOLAM 1 MG PO TABS: ORAL_TABLET | ORAL | 0 refills | Status: DC

## 2022-07-31 NOTE — Telephone Encounter (Signed)
Placed a call to [facility] regarding My-chart message. She her a reply, no answer. Placed a call no answer. Awaiting a return call.

## 2022-07-31 NOTE — Telephone Encounter (Signed)
Mary [name] PCP wrote for Alprazolam 1 mg prior to MRI. Medication list reviewed on Care-everywhere. This provider spoke with Mary [name] mother regarding the above and spoke with pharmacist from [facility].  Alprazolam e- scribed today, 1 mg. MRI scheduled for 08/01/2022. No Klonopin with be taken on 08/01/2022. This was discussed with Mary [name] mother.  Mary [name] is on a [employer].

## 2022-08-27 ENCOUNTER — Encounter: Payer: Medicaid Other | Admitting: Registered Nurse

## 2022-09-05 ENCOUNTER — Encounter: Payer: Medicaid Other | Attending: Registered Nurse | Admitting: Registered Nurse

## 2022-09-05 ENCOUNTER — Encounter: Payer: Self-pay | Admitting: Registered Nurse

## 2022-09-05 ENCOUNTER — Telehealth: Payer: Self-pay | Admitting: Registered Nurse

## 2022-09-05 VITALS — BP 105/76 | HR 87 | Ht 63.0 in | Wt 217.0 lb

## 2022-09-05 DIAGNOSIS — R531 Weakness: Secondary | ICD-10-CM

## 2022-09-05 DIAGNOSIS — M47816 Spondylosis without myelopathy or radiculopathy, lumbar region: Secondary | ICD-10-CM

## 2022-09-05 DIAGNOSIS — F331 Major depressive disorder, recurrent, moderate: Secondary | ICD-10-CM | POA: Diagnosis present

## 2022-09-05 DIAGNOSIS — Z5181 Encounter for therapeutic drug level monitoring: Secondary | ICD-10-CM

## 2022-09-05 DIAGNOSIS — M329 Systemic lupus erythematosus, unspecified: Secondary | ICD-10-CM

## 2022-09-05 DIAGNOSIS — Z79899 Other long term (current) drug therapy: Secondary | ICD-10-CM | POA: Diagnosis present

## 2022-09-05 DIAGNOSIS — G609 Hereditary and idiopathic neuropathy, unspecified: Secondary | ICD-10-CM

## 2022-09-05 DIAGNOSIS — G894 Chronic pain syndrome: Secondary | ICD-10-CM

## 2022-09-05 DIAGNOSIS — G35 Multiple sclerosis: Secondary | ICD-10-CM

## 2022-09-05 MED ORDER — OXYCODONE HCL 10 MG PO TABS: 10.0000 mg | ORAL_TABLET | Freq: Four times a day (QID) | ORAL | 0 refills | Status: DC | PRN

## 2022-09-05 MED ORDER — QUETIAPINE FUMARATE 100 MG PO TABS: 100.0000 mg | ORAL_TABLET | Freq: Every day | ORAL | 1 refills | Status: DC

## 2022-09-05 NOTE — Telephone Encounter (Signed)
Mary [name] stated: she is out of her Seroquel at today's visit, she reports she called her psychiatrist office, and they haven't return her call.  She had Seroquel 50 mg take 2 tablets at bedtime filled at [facility] on June 8,2024  for 180 tablets and at [facility] on June 8,2023 for 180 tablets. She had a 6 month supply.  Seroquel prescription sent to pharmacy. Call placed to [facility], Seroquel 100 mg one tablet at bedtime filled today, she verbalizes understanding.

## 2022-09-05 NOTE — Progress Notes (Signed)
Subjective:    Patient ID: Mary [name], adult    DOB: 07-12-1976, 48 y.o.   MRN: [id number]  HPI: Mary [name] is a 47 y.o. female who returns for follow up appointment for chronic pain and medication refill.Mary [name] states his pain is located in his neck radiating into his bilateral shoulders and lower back pain. He rates his pain 6. His current exercise regime is walking and performing stretching exercises.  [name] Morphine equivalent is 60.00 MME. He is also prescribed Clonazepam. .We have discussed the black box warning of using opioids and benzodiazepines. I highlighted the dangers of using these drugs together and discussed the adverse events including respiratory suppression, overdose, cognitive impairment and importance of compliance with current regimen. We will continue to monitor and adjust as indicated.   Last Oral Swab was Performed 04/11/2022, it was consistent.     Pain Inventory Average Pain 7 Pain Right Now 6 My pain is burning, dull, stabbing, and aching  In the last 24 hours, has pain interfered with the following? General activity 6 Relation with others 6 Enjoyment of life 8 What TIME of day is your pain at its worst? varies Sleep (in general) Poor  Pain is worse with: walking, bending, sitting, standing, and some activites Pain improves with: medication Relief from Meds: 5  Family History  Problem Relation Age of Onset   Diabetes Mother    Hypertension Mother    Cervical cancer Mother    Heart disease Mother    Diabetes Father    Hypertension Father    Heart disease Father    Colitis Maternal Aunt    Diabetes Other        many family members   CVA Maternal Grandmother    Lupus Neg Hx    Sarcoidosis Neg Hx    Pancreatitis Neg Hx    Ataxia Neg Hx    Chorea Neg Hx    Dementia Neg Hx    Mental retardation Neg Hx    Migraines Neg Hx    Multiple sclerosis Neg Hx    Neurofibromatosis Neg Hx    Neuropathy Neg Hx    Parkinsonism Neg Hx     Seizures Neg Hx    Stroke Neg Hx    Colon cancer Neg Hx    Social History   Socioeconomic History   Marital status: Significant Other    Spouse name: Not on file   Number of children: 0   Years of education: Not on file   Highest education level: Not on file  Occupational History   Not on file  Tobacco Use   Smoking status: Former    Packs/day: 0.50    Years: 20.00    Total pack years: 10.00    Types: Cigarettes    Quit date: 05/22/2013    Years since quitting: 9.2   Smokeless tobacco: Never  Vaping Use   Vaping Use: Some days   Substances: Nicotine  Substance and Sexual Activity   Alcohol use: No    Alcohol/week: 0.0 standard drinks of alcohol   Drug use: No   Sexual activity: Never    Birth control/protection: Abstinence  Other Topics Concern   Not on file  Social History Narrative   ** Merged History Encounter **       Lives permanently in [place] with mom and stepfather.  Has not started working yet and was unemployed in [place].         Social Determinants of Health  Financial Resource Strain: Not on file  Food Insecurity: Not on file  Transportation Needs: Not on file  Physical Activity: Not on file  Stress: Not on file  Social Connections: Not on file   Past Surgical History:  Procedure Laterality Date   ABDOMINAL SURGERY  ~ 2007   some sort of pancreatic cyst drainage.    ABDOMINAL SURGERY     ADENOIDECTOMY     CHOLECYSTECTOMY     ~ age 59-laparoscopic   CHOLECYSTECTOMY     EUS N/A 04/14/2013   Procedure: UPPER ENDOSCOPIC ULTRASOUND (EUS) LINEAR;  Surgeon: Milus Banister, MD;  Location: [facility];  Service: Endoscopy;  Laterality: N/A;   HERNIA REPAIR  2017   ROUX-EN-Y GASTRIC BYPASS     ROUX-EN-Y PROCEDURE     stent to pancreatic cyst that became infected within 36 hours   Past Surgical History:  Procedure Laterality Date   ABDOMINAL SURGERY  ~ 2007   some sort of pancreatic cyst drainage.    ABDOMINAL SURGERY     ADENOIDECTOMY      CHOLECYSTECTOMY     ~ age 68-laparoscopic   CHOLECYSTECTOMY     EUS N/A 04/14/2013   Procedure: UPPER ENDOSCOPIC ULTRASOUND (EUS) LINEAR;  Surgeon: Milus Banister, MD;  Location: [facility];  Service: Endoscopy;  Laterality: N/A;   HERNIA REPAIR  2017   ROUX-EN-Y GASTRIC BYPASS     ROUX-EN-Y PROCEDURE     stent to pancreatic cyst that became infected within 36 hours   Past Medical History:  Diagnosis Date   Abdominal hernia    Adrenal insufficiency ([place]) 04/23/2013   ??   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  Opioid Risk Score:   Fall Risk Score:  `1  Depression screen PHQ 2/9     06/27/2022   10:00 AM 04/11/2022   10:10 AM 11/25/2021   10:12 AM 10/11/2021    9:59 AM 08/05/2021   11:54 AM 05/31/2021    9:12 AM 03/28/2021   11:07 AM  Depression screen PHQ 2/9  Decreased Interest 0 0 3 2 0 1 1  Down, Depressed, Hopeless 0 0 3 2 1 3 1   PHQ - 2 Score 0 0 6 4 1 4 2      Review of Systems  Musculoskeletal:  Positive for back pain, gait problem and neck pain.  All other systems reviewed and are negative.     Objective:   Physical Exam Vitals and nursing note reviewed.  Constitutional:      Appearance: Normal appearance.  Cardiovascular:     Rate and Rhythm: Normal rate and regular rhythm.     Pulses: Normal pulses.     Heart sounds: Normal heart sounds.  Pulmonary:     Effort: Pulmonary effort is normal.     Breath sounds: Normal breath sounds.  Musculoskeletal:        General: No tenderness.     Cervical back: Normal range of motion and neck  supple.  Comments: Normal Muscle Bulk and Muscle Testing Reveals: Upper Extremities:Full ROM and Muscle Strength 5/5  Thoracic and Lumbar Hypersensitivity Lower Extremities: Decreased ROM and Muscle Strength 5/5 Bilateral Lower Extremities Flexion Produces Pain into his Bilateral Lower extremity with tingling and burning Arises from Table slowly Narrow Based  Gait     Skin:    General: Skin is warm and dry.  Neurological:     Mental Status: He is alert and oriented to person, place, and time.  Psychiatric:        Mood and Affect: Mood normal.        Behavior: Behavior normal.         Assessment & Plan:  1. MS: Neurology Following. Continue to Monitor. 09/05/2022 2. 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 Refilled: Oxycodone 10 mg one tablet every 6 hours as needed. #120. Second script sent for the following month. 09/05/2022. We will continue the opioid monitoring program, this consists of regular clinic visits, examinations, urine drug screen, pill counts as well as use of [place] Controlled Substance Reporting system. A 12 month History has been reviewed on the [place] Controlled Substance Reporting System on 09/05/2022 3. Chronic Low Back pain without sciatica/ Low back pain with  Radiculopathy RLE/: Continued  topamax.09/05/2022  4. Chronic pancreatitis: PCP and Gastroenterologist Following. 09/05/2022   5. Insomnia: Continue Pamelor. Continue to Monitor.09/05/2022 6. PTSD: Psychiatry and Dr. Sima Matas following. Marland Kitchen 09/05/2022. 7. Lupus: Rheumatology Following. 09/05/2022 8. Chronic Migraine/ Vascular Headaches: Continue Imitrex,Topamax and Aimovig.Continue to Monitor. 09/05/2022 9. Right Knee Pain: No complaints today. Continue HEP as Tolerated. Continue to Monitor. 09/05/2022 10. Muscle Spasm: Continue Baclofen/ Robaxin. Continue to Monitor. 09/05/2022 11. Cervicalgia: No complaints today.  Continue HEP as tolerated. Continue to monitor. 09/05/2022 12. Polyarthralgia: Continue current medication regimen. Continue to monitor. 09/05/2022 13. Right Sided Weakness: Continue HEP as Tolerated. Continue to Monitor. 09/05/2022   F/U in 2 months.    -=-=------=-==

## 2022-09-07 ENCOUNTER — Encounter: Payer: Self-pay | Admitting: Registered Nurse

## 2022-09-25 ENCOUNTER — Other Ambulatory Visit: Payer: Self-pay | Admitting: Registered Nurse

## 2022-09-25 DIAGNOSIS — G894 Chronic pain syndrome: Secondary | ICD-10-CM

## 2022-10-06 ENCOUNTER — Other Ambulatory Visit: Payer: Self-pay | Admitting: Registered Nurse

## 2022-10-06 DIAGNOSIS — G43719 Chronic migraine without aura, intractable, without status migrainosus: Secondary | ICD-10-CM

## 2022-10-20 ENCOUNTER — Other Ambulatory Visit: Payer: Self-pay | Admitting: Registered Nurse

## 2022-10-20 NOTE — Telephone Encounter (Signed)
PMP was reviewed.  Call placed to [facility], she's taking her clonazepam twice a day as needed for anxiety.  Clonazepam e-scribed to pharmacy, she verbalizes understanding.

## 2022-10-21 ENCOUNTER — Telehealth: Payer: Self-pay | Admitting: *Deleted

## 2022-10-21 NOTE — Telephone Encounter (Signed)
Prior auth submitted to insurance via CoverMyMeds for Aimovig '140mg'$ .

## 2022-10-22 NOTE — Telephone Encounter (Signed)
Amovig approved 3 for 90 days and approved for 12 months 10/22/22-10/22/23. MyChart notification sent and faxed to pharmacy as well.

## 2022-10-27 ENCOUNTER — Encounter: Payer: Medicaid Other | Admitting: Registered Nurse

## 2022-11-24 ENCOUNTER — Encounter: Payer: Self-pay | Admitting: Registered Nurse

## 2022-11-24 ENCOUNTER — Encounter: Payer: Medicaid Other | Attending: Registered Nurse | Admitting: Registered Nurse

## 2022-11-24 VITALS — BP 112/75 | HR 92 | Ht 63.0 in | Wt 220.0 lb

## 2022-11-24 DIAGNOSIS — G894 Chronic pain syndrome: Secondary | ICD-10-CM

## 2022-11-24 DIAGNOSIS — M545 Low back pain, unspecified: Secondary | ICD-10-CM | POA: Diagnosis present

## 2022-11-24 DIAGNOSIS — R531 Weakness: Secondary | ICD-10-CM

## 2022-11-24 DIAGNOSIS — Z79899 Other long term (current) drug therapy: Secondary | ICD-10-CM

## 2022-11-24 DIAGNOSIS — M329 Systemic lupus erythematosus, unspecified: Secondary | ICD-10-CM | POA: Diagnosis present

## 2022-11-24 DIAGNOSIS — G43719 Chronic migraine without aura, intractable, without status migrainosus: Secondary | ICD-10-CM

## 2022-11-24 DIAGNOSIS — G8929 Other chronic pain: Secondary | ICD-10-CM

## 2022-11-24 DIAGNOSIS — G35D Multiple sclerosis, unspecified: Secondary | ICD-10-CM

## 2022-11-24 DIAGNOSIS — M2550 Pain in unspecified joint: Secondary | ICD-10-CM | POA: Diagnosis present

## 2022-11-24 DIAGNOSIS — M25562 Pain in left knee: Secondary | ICD-10-CM | POA: Diagnosis present

## 2022-11-24 DIAGNOSIS — Z5181 Encounter for therapeutic drug level monitoring: Secondary | ICD-10-CM

## 2022-11-24 DIAGNOSIS — G609 Hereditary and idiopathic neuropathy, unspecified: Secondary | ICD-10-CM

## 2022-11-24 DIAGNOSIS — G35 Multiple sclerosis: Secondary | ICD-10-CM | POA: Insufficient documentation

## 2022-11-24 DIAGNOSIS — M47816 Spondylosis without myelopathy or radiculopathy, lumbar region: Secondary | ICD-10-CM

## 2022-11-24 DIAGNOSIS — M25561 Pain in right knee: Secondary | ICD-10-CM | POA: Diagnosis present

## 2022-11-24 MED ORDER — SUMATRIPTAN SUCCINATE 100 MG PO TABS
ORAL_TABLET | ORAL | 1 refills | Status: DC
Start: 2022-11-24 — End: 2023-02-27

## 2022-11-24 MED ORDER — OXYCODONE HCL 10 MG PO TABS
10.0000 mg | ORAL_TABLET | Freq: Four times a day (QID) | ORAL | 0 refills | Status: DC | PRN
Start: 2022-11-24 — End: 2023-01-19

## 2022-11-24 MED ORDER — OXYCODONE HCL 10 MG PO TABS: 10.0000 mg | ORAL_TABLET | Freq: Four times a day (QID) | ORAL | 0 refills | Status: DC | PRN

## 2022-11-24 MED ORDER — AIMOVIG 140 MG/ML ~~LOC~~ SOAJ
SUBCUTANEOUS | 5 refills | Status: DC
Start: 2022-11-24 — End: 2023-06-12

## 2022-11-24 NOTE — Progress Notes (Signed)
Subjective:    Patient ID: Mary [name], female    DOB: Aug 01, 1976, 47 y.o.   MRN: [id number]  HPI: Mary [name] is a 47 y.o. female who returns for follow up appointment for chronic pain and medication refill. He states his pain is located in his left shoulder.lower back, bilateral knees and bilateral ankles. Also reports generalized joint pain. He rates his pain 8.His  current exercise regime is walking   [place] mother reports he is having seizures, Mary [name]  states [place] Partner stated Mary [name] was having seizures. His last seizure was over 6 months ago, she reports. She was discharged from neurology service, she reports. She is in the process of obtaining a new neurologist for [facility].  They refused ED or Urgent Care evaluation at this time, no seizures noted at this time.   Mary [name] Morphine equivalent is 60.00 MME. He is also prescribed Clonazepam  .We have discussed the black box warning of using opioids and benzodiazepines. I highlighted the dangers of using these drugs together and discussed the adverse events including respiratory suppression, overdose, cognitive impairment and importance of compliance with current regimen. We will continue to monitor and adjust as indicated.  he is being closely monitored and under the care of his psychiatrist      Oral Swab Performed today.      Pain Inventory Average Pain 7 Pain Right Now 8 My pain is constant, sharp, burning, dull, stabbing, tingling, and aching  In the last 24 hours, has pain interfered with the following? General activity 5 Relation with others 5 Enjoyment of life 7 What TIME of day is your pain at its worst? morning  and night Sleep (in general) Fair  Pain is worse with: walking, sitting, and standing Pain improves with: rest, medication, and HEAT Relief from Meds: 6  Family History  Problem Relation Age of Onset   Diabetes Mother    Hypertension Mother    Cervical cancer Mother    Heart disease Mother     Diabetes Father    Hypertension Father    Heart disease Father    Colitis Maternal Aunt    Diabetes Other        many family members   CVA Maternal Grandmother    Lupus Neg Hx    Sarcoidosis Neg Hx    Pancreatitis Neg Hx    Ataxia Neg Hx    Chorea Neg Hx    Dementia Neg Hx    Mental retardation Neg Hx    Migraines Neg Hx    Multiple sclerosis Neg Hx    Neurofibromatosis Neg Hx    Neuropathy Neg Hx    Parkinsonism Neg Hx    Seizures Neg Hx    Stroke Neg Hx    Colon cancer Neg Hx    Social History   Socioeconomic History   Marital status: Significant Other    Spouse name: Not on file   Number of children: 0   Years of education: Not on file   Highest education level: Not on file  Occupational History   Not on file  Tobacco Use   Smoking status: Former    Packs/day: 0.50    Years: 20.00    Additional pack years: 0.00    Total pack years: 10.00    Types: Cigarettes    Quit date: 05/22/2013    Years since quitting: 9.5   Smokeless tobacco: Never  Vaping Use   Vaping Use: Some days   Substances: Nicotine  Substance and Sexual Activity   Alcohol use: No    Alcohol/week: 0.0 standard drinks of alcohol   Drug use: No   Sexual activity: Never    Birth control/protection: Abstinence  Other Topics Concern   Not on file  Social History Narrative   ** Merged History Encounter **       Lives permanently in [place] with mom and stepfather.  Has not started working yet and was unemployed in [place].         Social Determinants of Health   Financial Resource Strain: Not on file  Food Insecurity: Not on file  Transportation Needs: Not on file  Physical Activity: Not on file  Stress: Not on file  Social Connections: Not on file   Past Surgical History:  Procedure Laterality Date   ABDOMINAL SURGERY  ~ 2007   some sort of pancreatic cyst drainage.    ABDOMINAL SURGERY     ADENOIDECTOMY     CHOLECYSTECTOMY     ~ age 29-laparoscopic   CHOLECYSTECTOMY     EUS N/A  04/14/2013   Procedure: UPPER ENDOSCOPIC ULTRASOUND (EUS) LINEAR;  Surgeon: Rachael Fee, MD;  Location: [facility];  Service: Endoscopy;  Laterality: N/A;   HERNIA REPAIR  2017   ROUX-EN-Y GASTRIC BYPASS     ROUX-EN-Y PROCEDURE     stent to pancreatic cyst that became infected within 36 hours   Past Surgical History:  Procedure Laterality Date   ABDOMINAL SURGERY  ~ 2007   some sort of pancreatic cyst drainage.    ABDOMINAL SURGERY     ADENOIDECTOMY     CHOLECYSTECTOMY     ~ age 29-laparoscopic   CHOLECYSTECTOMY     EUS N/A 04/14/2013   Procedure: UPPER ENDOSCOPIC ULTRASOUND (EUS) LINEAR;  Surgeon: Rachael Fee, MD;  Location: [facility];  Service: Endoscopy;  Laterality: N/A;   HERNIA REPAIR  2017   ROUX-EN-Y GASTRIC BYPASS     ROUX-EN-Y PROCEDURE     stent to pancreatic cyst that became infected within 36 hours   Past Medical History:  Diagnosis Date   Abdominal hernia    Adrenal insufficiency ([facility]) 04/23/2013   ??   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    Pancreatic cyst 2002 onset   Pancreatitis 03/24/2013   2002, 2'2013, 03-14-13   PTSD (post-traumatic stress disorder)    Ventral hernia    BP 112/75   Pulse 92   Ht 5\' 3"  (1.6 m)   Wt 220 lb (99.8 kg)   SpO2 96%   BMI 38.97 kg/m   Opioid Risk Score:   Fall Risk Score:  `1  Depression screen [facility] 2/9     09/05/2022   10:47 AM 06/27/2022   10:00 AM 04/11/2022   10:10 AM 11/25/2021   10:12 AM 10/11/2021    9:59 AM 08/05/2021   11:54 AM 05/31/2021    9:12 AM  Depression screen PHQ 2/9  Decreased Interest 0 0 0 3 2 0 1  Down, Depressed, Hopeless 0 0 0 3 2 1 3   PHQ - 2 Score 0 0 0 6 4 1 4     Review of Systems  Musculoskeletal:  Positive for arthralgias, back pain and neck pain.       Pain in all joints  Neurological:  Positive for headaches.  All other systems reviewed and are negative.      Objective:   Physical Exam Vitals and nursing note reviewed.  Constitutional:      Appearance: Normal appearance.  Cardiovascular:     Rate and Rhythm: Normal rate and regular rhythm.     Pulses: Normal pulses.     Heart sounds: Normal heart sounds.  Pulmonary:     Effort: Pulmonary effort is normal.     Breath sounds: Normal breath sounds.  Musculoskeletal:     Cervical back: Normal range of motion and neck supple.     Comments: Normal Muscle Bulk and Muscle Testing Reveals:  Upper Extremities: Right: Full ROM and Muscle Strength 4/5 Left Upper Extremity: Decreased ROM 90 Degrees and Muscle Strength 3/5 Left AC Joint Tenderness Lumbar Paraspinal Tenderness: L-4-L-5 Lower Extremities: Full ROM and Muscle Strength 5/5 Bilateral Lower Extremities Flexion Produces Pain into her Bilateral Patella's and Bilateral Ankles Arises from Table slowly Narrow Based  Gait     Skin:    General: Skin is warm and dry.  Neurological:     Mental Status: He is alert and oriented to person, place, and time.  Psychiatric:        Mood and Affect: Mood normal.        Behavior: Behavior normal.         Assessment & Plan:  1. MS: In the process of obtaining a neurologist. Mother states Rheumatology following  Continue to Monitor. 11/24/2022 2. 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 Refilled: Oxycodone 10 mg one tablet every 6 hours as needed. #120. Second script sent for the following month. 11/24/2022. We will continue the opioid monitoring program, this consists of regular clinic visits, examinations, urine drug screen, pill counts as well as use of [place] Controlled Substance Reporting system. A 12 month History has been reviewed  on the [place] Controlled Substance Reporting System on 11/24/2022 3. Chronic Low Back pain without sciatica/ Low back pain with  Radiculopathy RLE/: Continued  topamax.11/24/2022  4. Chronic pancreatitis: PCP and Gastroenterologist Following. 11/24/2022   5. Insomnia: Continue Pamelor. Continue to Monitor.11/24/2022 6. PTSD: Psychiatry and Dr. Kieth Brightlyodenbough following. Marland Kitchen. 11/24/2022. 7. Lupus: Rheumatology Following. 11/24/2022 8. Chronic Migraine/ Vascular Headaches: Continue Imitrex,Topamax and Aimovig.Continue to Monitor. 11/24/2022 9. Bilateral  Knee Pain: . Continue HEP as Tolerated. Continue to Monitor. 11/24/2022 10. Muscle Spasm: Continue Baclofen/ Robaxin. Continue to Monitor. 11/24/2022 11. Cervicalgia: No complaints today. Continue HEP as tolerated. Continue to monitor. 11/24/2022 12. Polyarthralgia: Continue current medication regimen. Continue to monitor. 11/24/2022 13. Right Sided Weakness: Continue HEP as Tolerated. Continue to Monitor. 11/24/2022  14. Reports Seizure : Refused ED or Urgent Care evaluation: Mary [name] states she's in the process of obtaining a Neurology appointment   F/U in 2 months.

## 2022-11-28 LAB — DRUG TOX MONITOR 1 W/CONF, ORAL FLD
Amphetamines: NEGATIVE ng/mL (ref <10)
Barbiturates: NEGATIVE ng/mL (ref <10)
Benzodiazepines: NEGATIVE ng/mL (ref <0.50)
Buprenorphine: NEGATIVE ng/mL (ref <0.10)
Cocaine: NEGATIVE ng/mL (ref <5.0)
Codeine: NEGATIVE ng/mL (ref <2.5)
Cotinine: 221.2 ng/mL — ABNORMAL HIGH (ref <5.0)
Dihydrocodeine: NEGATIVE ng/mL (ref <2.5)
Fentanyl: NEGATIVE ng/mL (ref <0.10)
Heroin Metabolite: NEGATIVE ng/mL (ref <1.0)
Hydrocodone: NEGATIVE ng/mL (ref <2.5)
Hydromorphone: NEGATIVE ng/mL (ref <2.5)
MARIJUANA: NEGATIVE ng/mL (ref <2.5)
MDMA: NEGATIVE ng/mL (ref <10)
Meprobamate: NEGATIVE ng/mL (ref <2.5)
Methadone: NEGATIVE ng/mL (ref <5.0)
Morphine: NEGATIVE ng/mL (ref <2.5)
Nicotine Metabolite: POSITIVE ng/mL — AB (ref <5.0)
Norhydrocodone: NEGATIVE ng/mL (ref <2.5)
Noroxycodone: 107.6 ng/mL — ABNORMAL HIGH (ref <2.5)
Opiates: POSITIVE ng/mL — AB (ref <2.5)
Oxycodone: >250 ng/mL — ABNORMAL HIGH (ref <2.5)
Oxymorphone: 12.3 ng/mL — ABNORMAL HIGH (ref <2.5)
Phencyclidine: NEGATIVE ng/mL (ref <10)
Tapentadol: NEGATIVE ng/mL (ref <5.0)
Tramadol: NEGATIVE ng/mL (ref <5.0)
Zolpidem: NEGATIVE ng/mL (ref <5.0)

## 2022-11-28 LAB — DRUG TOX ALC METAB W/CON, ORAL FLD: Alcohol Metabolite: NEGATIVE ng/mL (ref <25)

## 2022-12-04 ENCOUNTER — Telehealth: Payer: Self-pay | Admitting: *Deleted

## 2022-12-04 NOTE — Telephone Encounter (Signed)
Oral swab drug screen was consistent for prescribed medications.  ?

## 2023-01-13 ENCOUNTER — Other Ambulatory Visit: Payer: Self-pay

## 2023-01-13 DIAGNOSIS — G894 Chronic pain syndrome: Secondary | ICD-10-CM

## 2023-01-13 DIAGNOSIS — G43719 Chronic migraine without aura, intractable, without status migrainosus: Secondary | ICD-10-CM

## 2023-01-13 DIAGNOSIS — M329 Systemic lupus erythematosus, unspecified: Secondary | ICD-10-CM

## 2023-01-13 DIAGNOSIS — R531 Weakness: Secondary | ICD-10-CM

## 2023-01-13 MED ORDER — BACLOFEN 10 MG PO TABS
ORAL_TABLET | ORAL | 2 refills | Status: DC
Start: 2023-01-13 — End: 2023-10-26

## 2023-01-19 ENCOUNTER — Encounter: Payer: Medicaid Other | Attending: Registered Nurse | Admitting: Registered Nurse

## 2023-01-19 ENCOUNTER — Encounter: Payer: Self-pay | Admitting: Registered Nurse

## 2023-01-19 VITALS — BP 118/78 | HR 69 | Ht 63.0 in | Wt 223.0 lb

## 2023-01-19 DIAGNOSIS — R531 Weakness: Secondary | ICD-10-CM | POA: Diagnosis present

## 2023-01-19 DIAGNOSIS — G8929 Other chronic pain: Secondary | ICD-10-CM | POA: Diagnosis present

## 2023-01-19 DIAGNOSIS — M545 Low back pain, unspecified: Secondary | ICD-10-CM | POA: Insufficient documentation

## 2023-01-19 DIAGNOSIS — G609 Hereditary and idiopathic neuropathy, unspecified: Secondary | ICD-10-CM | POA: Diagnosis present

## 2023-01-19 DIAGNOSIS — G35 Multiple sclerosis: Secondary | ICD-10-CM | POA: Diagnosis present

## 2023-01-19 DIAGNOSIS — G894 Chronic pain syndrome: Secondary | ICD-10-CM | POA: Diagnosis present

## 2023-01-19 DIAGNOSIS — Z79899 Other long term (current) drug therapy: Secondary | ICD-10-CM | POA: Insufficient documentation

## 2023-01-19 DIAGNOSIS — Z5181 Encounter for therapeutic drug level monitoring: Secondary | ICD-10-CM | POA: Insufficient documentation

## 2023-01-19 DIAGNOSIS — M2550 Pain in unspecified joint: Secondary | ICD-10-CM | POA: Insufficient documentation

## 2023-01-19 DIAGNOSIS — M329 Systemic lupus erythematosus, unspecified: Secondary | ICD-10-CM | POA: Diagnosis present

## 2023-01-19 MED ORDER — OXYCODONE HCL 10 MG PO TABS
10.0000 mg | ORAL_TABLET | Freq: Four times a day (QID) | ORAL | 0 refills | Status: DC | PRN
Start: 2023-01-19 — End: 2023-01-19

## 2023-01-19 MED ORDER — METHOCARBAMOL 500 MG PO TABS
500.0000 mg | ORAL_TABLET | Freq: Every day | ORAL | 1 refills | Status: DC
Start: 2023-01-19 — End: 2023-03-25

## 2023-01-19 MED ORDER — OXYCODONE HCL 10 MG PO TABS
10.0000 mg | ORAL_TABLET | Freq: Four times a day (QID) | ORAL | 0 refills | Status: DC | PRN
Start: 2023-01-19 — End: 2023-03-26

## 2023-01-19 NOTE — Progress Notes (Signed)
Subjective:    Patient ID: Mary [name], adult    DOB: 07-13-1976, 47 y.o.   MRN: [id number]  HPI: Mary [name] is a 47 y.o. female  who returns for follow up appointment for chronic pain and medication refill. He states his pain is located in his left shoulder, lower back and left lower extremity. Also reports generalized joint pain. She rates her pain 7. His current exercise regime is walking and performing stretching exercises.  Mary [name] Morphine equivalent is 60.00 MME. He is also prescribed Clonazepam .We have discussed the black box warning of using opioids and benzodiazepines. I highlighted the dangers of using these drugs together and discussed the adverse events including respiratory suppression, overdose, cognitive impairment and importance of compliance with current regimen. We will continue to monitor and adjust as indicated.   Last Oral Swab was Performed on 11/24/2022, it was consistent.     Pain Inventory Average Pain 7 Pain Right Now 7 My pain is constant, sharp, burning, dull, stabbing, tingling, and aching  In the last 24 hours, has pain interfered with the following? General activity 7 Relation with others 3 Enjoyment of life 9 What TIME of day is your pain at its worst? morning  and night Sleep (in general) Fair  Pain is worse with: walking, standing, and some activites Pain improves with: rest and medication Relief from Meds: 7  Family History  Problem Relation Age of Onset   Diabetes Mother    Hypertension Mother    Cervical cancer Mother    Heart disease Mother    Diabetes Father    Hypertension Father    Heart disease Father    Colitis Maternal Aunt    Diabetes Other        many family members   CVA Maternal Grandmother    Lupus Neg Hx    Sarcoidosis Neg Hx    Pancreatitis Neg Hx    Ataxia Neg Hx    Chorea Neg Hx    Dementia Neg Hx    Mental retardation Neg Hx    Migraines Neg Hx    Multiple sclerosis Neg Hx    Neurofibromatosis Neg Hx     Neuropathy Neg Hx    Parkinsonism Neg Hx    Seizures Neg Hx    Stroke Neg Hx    Colon cancer Neg Hx    Social History   Socioeconomic History   Marital status: Significant Other    Spouse name: Not on file   Number of children: 0   Years of education: Not on file   Highest education level: Not on file  Occupational History   Not on file  Tobacco Use   Smoking status: Former    Packs/day: 0.50    Years: 20.00    Additional pack years: 0.00    Total pack years: 10.00    Types: Cigarettes    Quit date: 05/22/2013    Years since quitting: 9.6   Smokeless tobacco: Never  Vaping Use   Vaping Use: Some days   Substances: Nicotine  Substance and Sexual Activity   Alcohol use: No    Alcohol/week: 0.0 standard drinks of alcohol   Drug use: No   Sexual activity: Never    Birth control/protection: Abstinence  Other Topics Concern   Not on file  Social History Narrative   ** Merged History Encounter **       Lives permanently in [place] with mom and stepfather.  Has not started working  yet and was unemployed in [place].         Social Determinants of Health   Financial Resource Strain: Not on file  Food Insecurity: Not on file  Transportation Needs: Not on file  Physical Activity: Not on file  Stress: Not on file  Social Connections: Not on file   Past Surgical History:  Procedure Laterality Date   ABDOMINAL SURGERY  ~ 2007   some sort of pancreatic cyst drainage.    ABDOMINAL SURGERY     ADENOIDECTOMY     CHOLECYSTECTOMY     ~ age 73-laparoscopic   CHOLECYSTECTOMY     EUS N/A 04/14/2013   Procedure: UPPER ENDOSCOPIC ULTRASOUND (EUS) LINEAR;  Surgeon: Rachael Fee, MD;  Location: [facility];  Service: Endoscopy;  Laterality: N/A;   HERNIA REPAIR  2017   ROUX-EN-Y GASTRIC BYPASS     ROUX-EN-Y PROCEDURE     stent to pancreatic cyst that became infected within 36 hours   Past Surgical History:  Procedure Laterality Date   ABDOMINAL SURGERY  ~ 2007   some  sort of pancreatic cyst drainage.    ABDOMINAL SURGERY     ADENOIDECTOMY     CHOLECYSTECTOMY     ~ age 73-laparoscopic   CHOLECYSTECTOMY     EUS N/A 04/14/2013   Procedure: UPPER ENDOSCOPIC ULTRASOUND (EUS) LINEAR;  Surgeon: Rachael Fee, MD;  Location: [facility];  Service: Endoscopy;  Laterality: N/A;   HERNIA REPAIR  2017   ROUX-EN-Y GASTRIC BYPASS     ROUX-EN-Y PROCEDURE     stent to pancreatic cyst that became infected within 36 hours   Past Medical History:  Diagnosis Date   Abdominal hernia    Adrenal insufficiency (HCC) 04/23/2013   ??   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    Pancreatic cyst 2002 onset   Pancreatitis 03/24/2013   2002, 2'2013, 03-14-13   PTSD (post-traumatic stress disorder)    Ventral hernia    Ht 5\' 3"  (1.6 m)   Wt 223 lb (101.2 kg)   BMI 39.50 kg/m   Opioid Risk Score:   Fall Risk Score:  `1  Depression screen [facility] 2/9     11/24/2022    9:59 AM 09/05/2022   10:47 AM 06/27/2022   10:00 AM 04/11/2022   10:10 AM 11/25/2021   10:12 AM 10/11/2021    9:59 AM 08/05/2021   11:54 AM  Depression screen PHQ 2/9  Decreased Interest 1 0 0 0 3 2 0  Down, Depressed, Hopeless 1 0 0 0 3 2 1   PHQ - 2 Score 2 0 0 0 6 4 1      Review of Systems  Musculoskeletal:  Positive for back pain.       Heat  LT arm leg  All other systems reviewed and are negative.      Objective:   Physical Exam Vitals and nursing note reviewed.  Constitutional:      Appearance: Normal appearance.  Cardiovascular:     Rate and Rhythm: Normal rate and regular rhythm.     Pulses: Normal pulses.     Heart sounds: Normal heart sounds.  Pulmonary:  Effort: Pulmonary effort is normal.      Breath sounds: Normal breath sounds.  Musculoskeletal:     Cervical back: Normal range of motion and neck supple.     Comments: Normal Muscle Bulk and Muscle Testing Reveals:  Upper Extremities: Right: Decreased ROM 90 Degrees  and Muscle Strength  4/5 Left Upper Extremity: Decreased ROM 45 Degrees and Muscle Strength 4/5 Left AC Joint Tenderness Lumbar Paraspinal Tenderness: L-4-L-5 Lower Extremities: Full ROM and Muscle Strength 5/5 Arises from Table with Ease Narrow Based Gait     Skin:    General: Skin is warm and dry.  Neurological:     Mental Status: He is alert and oriented to person, place, and time.  Psychiatric:        Mood and Affect: Mood normal.        Behavior: Behavior normal.         Assessment & Plan:  1. MS: In the process of obtaining a neurologist. Mother states Rheumatology following  Continue to Monitor. 06/ 10/2022 2. 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 Refilled: Oxycodone 10 mg one tablet every 6 hours as needed. #120. Second script sent for the following month. 01/19/2023. We will continue the opioid monitoring program, this consists of regular clinic visits, examinations, urine drug screen, pill counts as well as use of [place] Controlled Substance Reporting system. A 12 month History has been reviewed on the [place] Controlled Substance Reporting System on 01/19/2023 3. Chronic Low Back pain without sciatica/ Low back pain with  Radiculopathy RLE/: Continued  topamax.01/19/2023  4. Chronic pancreatitis: PCP and Gastroenterologist Following. 01/19/2023   5. Insomnia: Continue Pamelor. Continue to Monitor.01/19/2023 6. PTSD: Psychiatry and Dr. Kieth Brightly following. Marland Kitchen 01/19/2023. 7. Lupus: Rheumatology Following. 01/19/2023 8. Chronic Migraine/ Vascular Headaches: Continue Imitrex,Topamax and Aimovig.Continue to Monitor. 01/19/2023 9. Bilateral  Knee Pain: . Continue  HEP as Tolerated. Continue to Monitor. 01/19/2023 10. Muscle Spasm: Continue Baclofen/ Robaxin. Continue to Monitor. 01/19/2023 11. Cervicalgia: No complaints today. Continue HEP as tolerated. Continue to monitor. 01/19/2023 12. Polyarthralgia: Continue current medication regimen. Continue to monitor. 01/19/2023 13. Right Sided Weakness: Continue HEP as Tolerated. Continue to Monitor. 01/19/2023    F/U in 2 months.

## 2023-02-27 ENCOUNTER — Other Ambulatory Visit: Payer: Self-pay | Admitting: Registered Nurse

## 2023-02-27 DIAGNOSIS — R531 Weakness: Secondary | ICD-10-CM

## 2023-02-27 DIAGNOSIS — Z79899 Other long term (current) drug therapy: Secondary | ICD-10-CM

## 2023-02-27 DIAGNOSIS — Z5181 Encounter for therapeutic drug level monitoring: Secondary | ICD-10-CM

## 2023-02-27 DIAGNOSIS — G43719 Chronic migraine without aura, intractable, without status migrainosus: Secondary | ICD-10-CM

## 2023-02-27 DIAGNOSIS — G894 Chronic pain syndrome: Secondary | ICD-10-CM

## 2023-02-27 DIAGNOSIS — M329 Systemic lupus erythematosus, unspecified: Secondary | ICD-10-CM

## 2023-02-27 DIAGNOSIS — G35 Multiple sclerosis: Secondary | ICD-10-CM

## 2023-03-14 ENCOUNTER — Other Ambulatory Visit: Payer: Self-pay | Admitting: Registered Nurse

## 2023-03-26 ENCOUNTER — Encounter: Payer: Self-pay | Admitting: Registered Nurse

## 2023-03-26 ENCOUNTER — Encounter: Payer: Medicaid Other | Attending: Registered Nurse | Admitting: Registered Nurse

## 2023-03-26 VITALS — BP 103/72 | HR 75 | Ht 63.0 in | Wt 231.0 lb

## 2023-03-26 DIAGNOSIS — M329 Systemic lupus erythematosus, unspecified: Secondary | ICD-10-CM | POA: Diagnosis present

## 2023-03-26 DIAGNOSIS — Z5181 Encounter for therapeutic drug level monitoring: Secondary | ICD-10-CM | POA: Insufficient documentation

## 2023-03-26 DIAGNOSIS — M25512 Pain in left shoulder: Secondary | ICD-10-CM | POA: Diagnosis not present

## 2023-03-26 DIAGNOSIS — G35 Multiple sclerosis: Secondary | ICD-10-CM | POA: Insufficient documentation

## 2023-03-26 DIAGNOSIS — G609 Hereditary and idiopathic neuropathy, unspecified: Secondary | ICD-10-CM | POA: Insufficient documentation

## 2023-03-26 DIAGNOSIS — M2550 Pain in unspecified joint: Secondary | ICD-10-CM | POA: Diagnosis present

## 2023-03-26 DIAGNOSIS — M47816 Spondylosis without myelopathy or radiculopathy, lumbar region: Secondary | ICD-10-CM | POA: Diagnosis not present

## 2023-03-26 DIAGNOSIS — G8929 Other chronic pain: Secondary | ICD-10-CM | POA: Insufficient documentation

## 2023-03-26 DIAGNOSIS — M545 Low back pain, unspecified: Secondary | ICD-10-CM | POA: Insufficient documentation

## 2023-03-26 DIAGNOSIS — Z79899 Other long term (current) drug therapy: Secondary | ICD-10-CM | POA: Insufficient documentation

## 2023-03-26 DIAGNOSIS — G894 Chronic pain syndrome: Secondary | ICD-10-CM | POA: Diagnosis present

## 2023-03-26 DIAGNOSIS — G43719 Chronic migraine without aura, intractable, without status migrainosus: Secondary | ICD-10-CM | POA: Diagnosis present

## 2023-03-26 MED ORDER — OXYCODONE HCL 10 MG PO TABS
10.0000 mg | ORAL_TABLET | Freq: Four times a day (QID) | ORAL | 0 refills | Status: DC | PRN
Start: 2023-03-26 — End: 2023-06-12

## 2023-03-26 MED ORDER — OXYCODONE HCL 10 MG PO TABS
10.0000 mg | ORAL_TABLET | Freq: Four times a day (QID) | ORAL | 0 refills | Status: DC | PRN
Start: 2023-03-26 — End: 2023-03-26

## 2023-03-26 NOTE — Progress Notes (Signed)
Subjective:    Patient ID: Mary [name], adult    DOB: September 09, 1975, 47 y.o.   MRN: [id number]  HPI: Mary [name] is a 47 y.o. female who returns for follow up appointment for chronic pain and medication refill. He  reports he has a headache that waxes and wanes  and states his pain is located in his left shoulder, upper back mainly left side and left knee pain. Also reports tingling and burning in his bilateral hands and bilateral feet. He rates his pain 5. His current exercise regime is walking and performing stretching exercises.  Sameen Timpone Morphine equivalent is 60.00 MME.   Last Oral Swab was Performed on 11/24/2022, it was consistent.      Pain Inventory Average Pain 7 Pain Right Now 5 My pain is constant, sharp, burning, dull, stabbing, tingling, and aching  In the last 24 hours, has pain interfered with the following? General activity 9 Relation with others 4 Enjoyment of life 10 What TIME of day is your pain at its worst? morning  Sleep (in general) Poor  Pain is worse with: walking and bending Pain improves with: pacing activities and medication Relief from Meds: 5  Family History  Problem Relation Age of Onset   Diabetes Mother    Hypertension Mother    Cervical cancer Mother    Heart disease Mother    Diabetes Father    Hypertension Father    Heart disease Father    Colitis Maternal Aunt    Diabetes Other        many family members   CVA Maternal Grandmother    Lupus Neg Hx    Sarcoidosis Neg Hx    Pancreatitis Neg Hx    Ataxia Neg Hx    Chorea Neg Hx    Dementia Neg Hx    Mental retardation Neg Hx    Migraines Neg Hx    Multiple sclerosis Neg Hx    Neurofibromatosis Neg Hx    Neuropathy Neg Hx    Parkinsonism Neg Hx    Seizures Neg Hx    Stroke Neg Hx    Colon cancer Neg Hx    Social History   Socioeconomic History   Marital status: Significant Other    Spouse name: Not on file   Number of children: 0   Years of education: Not on file    Highest education level: Not on file  Occupational History   Not on file  Tobacco Use   Smoking status: Former    Current packs/day: 0.00    Average packs/day: 0.5 packs/day for 20.0 years (10.0 ttl pk-yrs)    Types: Cigarettes    Start date: 05/22/1993    Quit date: 05/22/2013    Years since quitting: 9.8   Smokeless tobacco: Never  Vaping Use   Vaping status: Some Days   Substances: Nicotine  Substance and Sexual Activity   Alcohol use: No    Alcohol/week: 0.0 standard drinks of alcohol   Drug use: No   Sexual activity: Never    Birth control/protection: Abstinence  Other Topics Concern   Not on file  Social History Narrative   ** Merged History Encounter **       Lives permanently in [place] with mom and stepfather.  Has not started working yet and was unemployed in [place].         Social Determinants of Health   Financial Resource Strain: Not on file  Food Insecurity: Not on file  Transportation Needs: Unmet Transportation Needs (08/27/2021)   Received from [facility] - Transportation    Lack of Transportation (Medical): Yes    Lack of Transportation (Non-Medical): Yes  Physical Activity: Not on file  Stress: Not on file  Social Connections: Unknown (12/23/2021)   Received from [facility]   Social Network    Social Network: Not on file   Past Surgical History:  Procedure Laterality Date   ABDOMINAL SURGERY  ~ 2007   some sort of pancreatic cyst drainage.    ABDOMINAL SURGERY     ADENOIDECTOMY     CHOLECYSTECTOMY     ~ age 32-laparoscopic   CHOLECYSTECTOMY     EUS N/A 04/14/2013   Procedure: UPPER ENDOSCOPIC ULTRASOUND (EUS) LINEAR;  Surgeon: Rachael Fee, MD;  Location: [facility];  Service: Endoscopy;  Laterality: N/A;   HERNIA REPAIR  2017   ROUX-EN-Y GASTRIC BYPASS     ROUX-EN-Y PROCEDURE     stent to pancreatic cyst that became infected within 36 hours   Past Surgical History:  Procedure Laterality Date   ABDOMINAL SURGERY  ~ 2007    some sort of pancreatic cyst drainage.    ABDOMINAL SURGERY     ADENOIDECTOMY     CHOLECYSTECTOMY     ~ age 32-laparoscopic   CHOLECYSTECTOMY     EUS N/A 04/14/2013   Procedure: UPPER ENDOSCOPIC ULTRASOUND (EUS) LINEAR;  Surgeon: Rachael Fee, MD;  Location: [facility];  Service: Endoscopy;  Laterality: N/A;   HERNIA REPAIR  2017   ROUX-EN-Y GASTRIC BYPASS     ROUX-EN-Y PROCEDURE     stent to pancreatic cyst that became infected within 36 hours   Past Medical History:  Diagnosis Date   Abdominal hernia    Adrenal insufficiency (HCC) 04/23/2013   ??   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  Opioid Risk Score:   Fall Risk Score:  `1  Depression screen [facility] 2/9     01/19/2023   10:22 AM 11/24/2022    9:59 AM 09/05/2022   10:47 AM 06/27/2022   10:00 AM 04/11/2022   10:10 AM 11/25/2021   10:12 AM 10/11/2021    9:59 AM  Depression screen PHQ 2/9  Decreased Interest 1 1 0 0 0 3 2  Down, Depressed, Hopeless 1 1 0 0 0 3 2  PHQ - 2 Score 2 2 0 0 0 6 4    Review of Systems  Musculoskeletal:  Positive for gait problem.       Left shoulder pain, left knee pain, left foot pain, left arm pain  Neurological:  Positive for headaches.  All other systems reviewed and are negative.     Objective:   Physical Exam Vitals and nursing note reviewed.  Constitutional:      Appearance: Normal appearance.  Cardiovascular:     Rate and Rhythm: Normal rate and regular rhythm.     Pulses: Normal  pulses.     Heart sounds:  Normal heart sounds.  Pulmonary:     Effort: Pulmonary effort is normal.     Breath sounds: Normal breath sounds.  Musculoskeletal:     Cervical back: Normal range of motion and neck supple.     Comments: Normal Muscle Bulk and Muscle Testing Reveals:  Upper Extremities: Right: Full ROM and Muscle Strength 5/5 Left Upper Extremity: Decreased ROM 45 Degrees and Muscle Strength 4/5 Lumbar Paraspinal Tenderness: L-4-L-5 Lower Extremities: Full ROM and Muscle Strength 5/5 Arises from Table with ease Narrow Based  Gait     Skin:    General: Skin is warm and dry.  Neurological:     Mental Status: He is alert and oriented to person, place, and time.  Psychiatric:        Mood and Affect: Mood normal.        Behavior: Behavior normal.         Assessment & Plan:  1. MS: In the process of obtaining a neurologist. Mother states Rheumatology following  Continue to Monitor. 08/ 03/2023 2. 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 Refilled: Oxycodone 10 mg one tablet every 6 hours as needed. #120. Second script sent for the following month. 03/26/2023. We will continue the opioid monitoring program, this consists of regular clinic visits, examinations, urine drug screen, pill counts as well as use of [place] Controlled Substance Reporting system. A 12 month History has been reviewed on the [place] Controlled Substance Reporting System on 03/26/2023 3. Chronic Low Back pain without sciatica/ Low back pain with  Radiculopathy RLE/: Continued  topamax.03/26/2023  4. Chronic pancreatitis: PCP and Gastroenterologist Following. 03/26/2023   5. Insomnia: Continue Pamelor. Continue to Monitor.03/26/2023 6. PTSD: Psychiatry and Dr. Kieth Brightly following. Marland Kitchen 01/19/2023. 7. Lupus: Rheumatology Following. 01/19/2023 8. Chronic Migraine/ Vascular Headaches: Continue Imitrex,Topamax and Aimovig.Continue to Monitor.  01/19/2023 9. Bilateral  Knee Pain: . Continue HEP as Tolerated. Continue to Monitor. 01/19/2023 10. Muscle Spasm: Continue Baclofen/ Robaxin. Continue to Monitor. 01/19/2023 11. Cervicalgia: No complaints today. Continue HEP as tolerated. Continue to monitor. 01/19/2023 12. Polyarthralgia: Continue current medication regimen. Continue to monitor. 01/19/2023 13. Right Sided Weakness: Continue HEP as Tolerated. Continue to Monitor. 01/19/2023     F/U in 2 months.

## 2023-04-15 ENCOUNTER — Telehealth: Payer: Self-pay | Admitting: *Deleted

## 2023-04-15 NOTE — Telephone Encounter (Signed)
[employer] auth initiated with [employer] via CoverMyMeds.

## 2023-04-16 NOTE — Telephone Encounter (Signed)
Approved on August 28 by [employer] 2017 Approved. OXYCODONE HCL Tablet 10MG  is approved from 04/10/2023 to 10/16/2023.

## 2023-04-27 ENCOUNTER — Other Ambulatory Visit: Payer: Self-pay | Admitting: Registered Nurse

## 2023-04-27 DIAGNOSIS — G43719 Chronic migraine without aura, intractable, without status migrainosus: Secondary | ICD-10-CM

## 2023-05-18 MED ORDER — CLONAZEPAM 0.5 MG PO TABS: 0.5000 mg | ORAL_TABLET | Freq: Two times a day (BID) | ORAL | 3 refills | Status: DC | PRN

## 2023-05-18 NOTE — Telephone Encounter (Signed)
Rx written and sent to the pharmacy. Thanks!

## 2023-05-20 ENCOUNTER — Other Ambulatory Visit: Payer: Self-pay | Admitting: Registered Nurse

## 2023-06-12 ENCOUNTER — Encounter: Payer: Self-pay | Admitting: Registered Nurse

## 2023-06-12 ENCOUNTER — Encounter: Payer: Medicaid Other | Attending: Registered Nurse | Admitting: Registered Nurse

## 2023-06-12 VITALS — BP 107/76 | HR 77 | Ht 63.0 in | Wt 227.0 lb

## 2023-06-12 DIAGNOSIS — Z79899 Other long term (current) drug therapy: Secondary | ICD-10-CM | POA: Insufficient documentation

## 2023-06-12 DIAGNOSIS — M2550 Pain in unspecified joint: Secondary | ICD-10-CM | POA: Insufficient documentation

## 2023-06-12 DIAGNOSIS — M545 Low back pain, unspecified: Secondary | ICD-10-CM | POA: Insufficient documentation

## 2023-06-12 DIAGNOSIS — G35 Multiple sclerosis: Secondary | ICD-10-CM | POA: Insufficient documentation

## 2023-06-12 DIAGNOSIS — Z79891 Long term (current) use of opiate analgesic: Secondary | ICD-10-CM | POA: Insufficient documentation

## 2023-06-12 DIAGNOSIS — G609 Hereditary and idiopathic neuropathy, unspecified: Secondary | ICD-10-CM | POA: Diagnosis present

## 2023-06-12 DIAGNOSIS — G894 Chronic pain syndrome: Secondary | ICD-10-CM | POA: Diagnosis not present

## 2023-06-12 DIAGNOSIS — M329 Systemic lupus erythematosus, unspecified: Secondary | ICD-10-CM | POA: Diagnosis present

## 2023-06-12 DIAGNOSIS — Z5181 Encounter for therapeutic drug level monitoring: Secondary | ICD-10-CM | POA: Insufficient documentation

## 2023-06-12 DIAGNOSIS — M47816 Spondylosis without myelopathy or radiculopathy, lumbar region: Secondary | ICD-10-CM | POA: Insufficient documentation

## 2023-06-12 DIAGNOSIS — M25512 Pain in left shoulder: Secondary | ICD-10-CM | POA: Diagnosis present

## 2023-06-12 DIAGNOSIS — G43719 Chronic migraine without aura, intractable, without status migrainosus: Secondary | ICD-10-CM | POA: Diagnosis present

## 2023-06-12 DIAGNOSIS — G8929 Other chronic pain: Secondary | ICD-10-CM | POA: Insufficient documentation

## 2023-06-12 MED ORDER — OXYCODONE HCL 10 MG PO TABS: 10.0000 mg | ORAL_TABLET | Freq: Four times a day (QID) | ORAL | 0 refills | Status: DC | PRN

## 2023-06-12 MED ORDER — AIMOVIG 140 MG/ML ~~LOC~~ SOAJ: SUBCUTANEOUS | 5 refills | Status: DC

## 2023-06-12 NOTE — Progress Notes (Signed)
Subjective:    Patient ID: Mary [name], adult    DOB: 03/30/1976, 47 y.o.   MRN: [id number]  HPI: Mary [name] is a 47 y.o. female who returns for follow up appointment for chronic pain and medication refill. Alliannah states his pain is located in his .left shoulder, lower back pain, and generalized joint pain. Bilateral hands and Bilateral feet with tingling and burning. He rates his pain 6. His current exercise regime is walking and performing stretching exercises.  Breiona Vonstein Morphine equivalent is 60.00 MME. Heis also prescribed Clonazepam .We have discussed the black box warning of using opioids and benzodiazepines. I highlighted the dangers of using these drugs together and discussed the adverse events including respiratory suppression, overdose, cognitive impairment and importance of compliance with current regimen. We will continue to monitor and adjust as indicated.  he is being closely monitored and under the care of his psychiatrist.    UDS ordered today.     Pain Inventory Average Pain 5 Pain Right Now 6 My pain is sharp, burning, dull, stabbing, tingling, and aching  In the last 24 hours, has pain interfered with the following? General activity 5 Relation with others 4 Enjoyment of life 7 What TIME of day is your pain at its worst? morning  and night Sleep (in general) Fair  Pain is worse with: walking, bending, and standing Pain improves with: rest and medication Relief from Meds: 5  Family History  Problem Relation Age of Onset   Diabetes Mother    Hypertension Mother    Cervical cancer Mother    Heart disease Mother    Diabetes Father    Hypertension Father    Heart disease Father    Colitis Maternal Aunt    Diabetes Other        many family members   CVA Maternal Grandmother    Lupus Neg Hx    Sarcoidosis Neg Hx    Pancreatitis Neg Hx    Ataxia Neg Hx    Chorea Neg Hx    Dementia Neg Hx    Mental retardation Neg Hx    Migraines Neg Hx    Multiple  sclerosis Neg Hx    Neurofibromatosis Neg Hx    Neuropathy Neg Hx    Parkinsonism Neg Hx    Seizures Neg Hx    Stroke Neg Hx    Colon cancer Neg Hx    Social History   Socioeconomic History   Marital status: Significant Other    Spouse name: Not on file   Number of children: 0   Years of education: Not on file   Highest education level: Not on file  Occupational History   Not on file  Tobacco Use   Smoking status: Former    Current packs/day: 0.00    Average packs/day: 0.5 packs/day for 20.0 years (10.0 ttl pk-yrs)    Types: Cigarettes    Start date: 05/22/1993    Quit date: 05/22/2013    Years since quitting: 10.0   Smokeless tobacco: Never  Vaping Use   Vaping status: Some Days   Substances: Nicotine  Substance and Sexual Activity   Alcohol use: No    Alcohol/week: 0.0 standard drinks of alcohol   Drug use: No   Sexual activity: Never    Birth control/protection: Abstinence  Other Topics Concern   Not on file  Social History Narrative   ** Merged History Encounter **       Lives permanently in [place]  with mom and stepfather.  Has not started working yet and was unemployed in [place].         Social Determinants of Health   Financial Resource Strain: Not on file  Food Insecurity: Low Risk  (04/08/2023)   Received from [facility]   Hunger Vital Sign    Worried About Running Out of Food in the Last Year: Never true    Ran Out of Food in the Last Year: Never true  Transportation Needs: Not on file (04/08/2023)  Physical Activity: Not on file  Stress: Not on file  Social Connections: Unknown (12/23/2021)   Received from [facility]   Social Network    Social Network: Not on file   Past Surgical History:  Procedure Laterality Date   ABDOMINAL SURGERY  ~ 2007   some sort of pancreatic cyst drainage.    ABDOMINAL SURGERY     ADENOIDECTOMY     CHOLECYSTECTOMY     ~ age 68-laparoscopic   CHOLECYSTECTOMY     EUS N/A 04/14/2013   Procedure:  UPPER ENDOSCOPIC ULTRASOUND (EUS) LINEAR;  Surgeon: Rachael Fee, MD;  Location: [facility];  Service: Endoscopy;  Laterality: N/A;   HERNIA REPAIR  2017   ROUX-EN-Y GASTRIC BYPASS     ROUX-EN-Y PROCEDURE     stent to pancreatic cyst that became infected within 36 hours   Past Surgical History:  Procedure Laterality Date   ABDOMINAL SURGERY  ~ 2007   some sort of pancreatic cyst drainage.    ABDOMINAL SURGERY     ADENOIDECTOMY     CHOLECYSTECTOMY     ~ age 68-laparoscopic   CHOLECYSTECTOMY     EUS N/A 04/14/2013   Procedure: UPPER ENDOSCOPIC ULTRASOUND (EUS) LINEAR;  Surgeon: Rachael Fee, MD;  Location: [facility];  Service: Endoscopy;  Laterality: N/A;   HERNIA REPAIR  2017   ROUX-EN-Y GASTRIC BYPASS     ROUX-EN-Y PROCEDURE     stent to pancreatic cyst that became infected within 36 hours   Past Medical History:  Diagnosis Date   Abdominal hernia    Adrenal insufficiency (HCC) 04/23/2013   ??   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  Opioid Risk Score:   Fall Risk Score:  `1  Depression screen PHQ 2/9     03/26/2023   10:00 AM 01/19/2023   10:22 AM 11/24/2022    9:59 AM 09/05/2022   10:47 AM 06/27/2022   10:00 AM 04/11/2022   10:10 AM 11/25/2021   10:12 AM  Depression screen PHQ 2/9  Decreased Interest 1 1 1  0 0 0 3  Down, Depressed, Hopeless 1 1 1  0 0 0 3  PHQ - 2 Score 2 2 2  0 0 0 6     Review of Systems  Musculoskeletal:        Bilateral arm pain Right foot pain  All other systems reviewed  and are negative.  Objective:   Physical Exam Vitals and nursing note reviewed.  Constitutional:      Appearance: Normal appearance. He is obese.  Cardiovascular:     Rate and Rhythm: Normal rate and regular rhythm.     Pulses: Normal pulses.     Heart sounds: Normal heart sounds.  Pulmonary:     Effort: Pulmonary effort is normal.     Breath sounds: Normal breath sounds.  Musculoskeletal:     Cervical back: Normal range of motion and neck supple.     Comments: Normal Muscle Bulk and Muscle Testing Reveals:  Upper Extremities: Decreased ROM 45 Degrees and Muscle Strength 4/5 Left AC Joint Tenderness Thoracic Paraspinal Tenderness Mainly Left Side  Lumbar Paraspinal Tenderness: L-3-L-5 Mainly Left Side Lower Extremities: Right: Full ROM and Muscle Strength 5/5 Left Lower Extremity: Decreased ROM and Muscle Strength 5/5 Left Lower Extremity Flexion Produces Pain into his Left Lower Extremity Arises from Table slowly Antalgic  Gait     Skin:    General: Skin is warm and dry.  Neurological:     Mental Status: He is alert and oriented to person, place, and time.  Psychiatric:        Mood and Affect: Mood normal.        Behavior: Behavior normal.         Assessment & Plan:  1. MS: In the process of obtaining a neurologist.  Rheumatology following  Continue to Monitor. 10/ 25/2024 2. 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 Refilled: Oxycodone 10 mg one tablet every 6 hours as needed. #120. Second script sent for the following month. 06/12/2023. We will continue the opioid monitoring program, this consists of regular clinic visits, examinations, urine drug screen, pill counts as well as use of [place] Controlled Substance Reporting system. A 12 month History has been reviewed on the [place] Controlled Substance Reporting System on 06/12/2023 3. Chronic Low Back pain without sciatica/ Low back  pain with  Radiculopathy RLE/: Continued  topamax.06/12/2023  4. Chronic pancreatitis: PCP and Gastroenterologist Following. 06/12/2023   5. Insomnia: Continue Pamelor. Continue to Monitor.06/12/2023 6. PTSD: Psychiatry and Dr. Kieth Brightly following. . 06/12/2023. 7. Lupus: Rheumatology Following. 06/12/2023 8. Chronic Migraine/ Vascular Headaches: Continue Imitrex,Topamax and Aimovig.Continue to Monitor. 06/12/2023 9. Bilateral  Knee Pain: . Continue HEP as Tolerated. Continue to Monitor. 06/12/2023 10. Muscle Spasm: Continue Baclofen/ Robaxin. Continue to Monitor. 06/12/2023 11. Cervicalgia: No complaints today. Continue HEP as tolerated. Continue to monitor. 06/12/2023 12. Polyarthralgia: Continue current medication regimen. Continue to monitor. 06/12/2023 13. Right Sided Weakness: Continue HEP as Tolerated. Continue to Monitor. 06/12/2023     F/U in 2 months.

## 2023-06-17 LAB — TOXASSURE SELECT,+ANTIDEPR,UR

## 2023-06-21 ENCOUNTER — Other Ambulatory Visit: Payer: Self-pay | Admitting: Registered Nurse

## 2023-06-21 DIAGNOSIS — G43719 Chronic migraine without aura, intractable, without status migrainosus: Secondary | ICD-10-CM

## 2023-07-24 ENCOUNTER — Other Ambulatory Visit: Payer: Self-pay | Admitting: Registered Nurse

## 2023-07-24 DIAGNOSIS — M329 Systemic lupus erythematosus, unspecified: Secondary | ICD-10-CM

## 2023-07-24 DIAGNOSIS — G35 Multiple sclerosis: Secondary | ICD-10-CM

## 2023-08-10 ENCOUNTER — Encounter: Payer: Medicaid Other | Attending: Registered Nurse | Admitting: Registered Nurse

## 2023-08-10 ENCOUNTER — Encounter: Payer: Self-pay | Admitting: Registered Nurse

## 2023-08-10 VITALS — Ht 63.0 in

## 2023-08-10 DIAGNOSIS — Z79899 Other long term (current) drug therapy: Secondary | ICD-10-CM | POA: Insufficient documentation

## 2023-08-10 DIAGNOSIS — G894 Chronic pain syndrome: Secondary | ICD-10-CM | POA: Insufficient documentation

## 2023-08-10 DIAGNOSIS — G35 Multiple sclerosis: Secondary | ICD-10-CM | POA: Insufficient documentation

## 2023-08-10 DIAGNOSIS — Z5181 Encounter for therapeutic drug level monitoring: Secondary | ICD-10-CM | POA: Insufficient documentation

## 2023-08-10 DIAGNOSIS — M62838 Other muscle spasm: Secondary | ICD-10-CM | POA: Insufficient documentation

## 2023-08-10 DIAGNOSIS — M545 Low back pain, unspecified: Secondary | ICD-10-CM | POA: Diagnosis present

## 2023-08-10 DIAGNOSIS — M2550 Pain in unspecified joint: Secondary | ICD-10-CM | POA: Insufficient documentation

## 2023-08-10 DIAGNOSIS — M329 Systemic lupus erythematosus, unspecified: Secondary | ICD-10-CM | POA: Diagnosis present

## 2023-08-10 DIAGNOSIS — G43719 Chronic migraine without aura, intractable, without status migrainosus: Secondary | ICD-10-CM | POA: Insufficient documentation

## 2023-08-10 DIAGNOSIS — G8929 Other chronic pain: Secondary | ICD-10-CM | POA: Insufficient documentation

## 2023-08-10 MED ORDER — OXYCODONE HCL 10 MG PO TABS: 10.0000 mg | ORAL_TABLET | Freq: Four times a day (QID) | ORAL | 0 refills | Status: DC | PRN

## 2023-08-10 NOTE — Progress Notes (Signed)
Subjective:    Patient ID: Mary [name], adult    DOB: 07/01/1976, 47 y.o.   MRN: [id number]  HPI: Mary [name] is a 47 y.o. female who is scheduled for  Virtual Video visit for follow up appointment for chronic pain and medication refill. I connected with Mary [name]  by telephone, her appointment was changed to telephone visit, was unable to hear [facility].I am speaking with the correct person using two identifiers.  Location: Patient: In her Home  Provider: In the Office    I discussed the limitations, risks, security and privacy concerns of performing an evaluation and management service by telephone and the availability of in person appointments. I also discussed with the patient that there may be a patient responsible charge related to this service. The patient expressed understanding and agreed to proceed.  Mary [name] states her pain is located in her lower back and generalized joint pain. Mary [name] rates his pain 6. His current exercise regime is walking and performing stretching exercises.  Mary [name] equivalent is 60.00 MME.  He is also prescribed Clonazepam .We have discussed the black box warning of using opioids and benzodiazepines. I highlighted the dangers of using these drugs together and discussed the adverse events including respiratory suppression, overdose, cognitive impairment and importance of compliance with current regimen. We will continue to monitor and adjust as indicated.       Pain Inventory Average Pain 5 Pain Right Now 6 My pain is sharp, burning, dull, stabbing, tingling, and aching  In the last 24 hours, has pain interfered with the following? General activity 6 Relation with others 5 Enjoyment of life 4 What TIME of day is your pain at its worst? morning  and night Sleep (in general) Poor  Pain is worse with: walking, bending, sitting, inactivity, standing, and some activites Pain improves with: rest and medication Relief from Meds: 8  Family  History  Problem Relation Age of Onset   Diabetes Mother    Hypertension Mother    Cervical cancer Mother    Heart disease Mother    Diabetes Father    Hypertension Father    Heart disease Father    Colitis Maternal Aunt    Diabetes Other        many family members   CVA Maternal Grandmother    Lupus Neg Hx    Sarcoidosis Neg Hx    Pancreatitis Neg Hx    Ataxia Neg Hx    Chorea Neg Hx    Dementia Neg Hx    Mental retardation Neg Hx    Migraines Neg Hx    Multiple sclerosis Neg Hx    Neurofibromatosis Neg Hx    Neuropathy Neg Hx    Parkinsonism Neg Hx    Seizures Neg Hx    Stroke Neg Hx    Colon cancer Neg Hx    Social History   Socioeconomic History   Marital status: Significant Other    Spouse name: Not on file   Number of children: 0   Years of education: Not on file   Highest education level: Not on file  Occupational History   Not on file  Tobacco Use   Smoking status: Former    Current packs/day: 0.00    Average packs/day: 0.5 packs/day for 20.0 years (10.0 ttl pk-yrs)    Types: Cigarettes    Start date: 05/22/1993    Quit date: 05/22/2013    Years since quitting: 10.2   Smokeless tobacco:  Never  Vaping Use   Vaping status: Some Days   Substances: Nicotine  Substance and Sexual Activity   Alcohol use: No    Alcohol/week: 0.0 standard drinks of alcohol   Drug use: No   Sexual activity: Never    Birth control/protection: Abstinence  Other Topics Concern   Not on file  Social History Narrative   ** Merged History Encounter **       Lives permanently in [place] with mom and stepfather.  Has not started working yet and was unemployed in [place].         Social Drivers of Corporate investment banker Strain: Not on file  Food Insecurity: Low Risk  (04/08/2023)   Received from [facility]   Hunger Vital Sign    Worried About Running Out of Food in the Last Year: Never true    Ran Out of Food in the Last Year: Never true  Transportation Needs: Not on  file (04/08/2023)  Physical Activity: Not on file  Stress: Not on file  Social Connections: Unknown (12/23/2021)   Received from [facility]   Social Network    Social Network: Not on file   Past Surgical History:  Procedure Laterality Date   ABDOMINAL SURGERY  ~ 2007   some sort of pancreatic cyst drainage.    ABDOMINAL SURGERY     ADENOIDECTOMY     CHOLECYSTECTOMY     ~ age 41-laparoscopic   CHOLECYSTECTOMY     EUS N/A 04/14/2013   Procedure: UPPER ENDOSCOPIC ULTRASOUND (EUS) LINEAR;  Surgeon: Rachael Fee, MD;  Location: [facility];  Service: Endoscopy;  Laterality: N/A;   HERNIA REPAIR  2017   ROUX-EN-Y GASTRIC BYPASS     ROUX-EN-Y PROCEDURE     stent to pancreatic cyst that became infected within 36 hours   Past Surgical History:  Procedure Laterality Date   ABDOMINAL SURGERY  ~ 2007   some sort of pancreatic cyst drainage.    ABDOMINAL SURGERY     ADENOIDECTOMY     CHOLECYSTECTOMY     ~ age 41-laparoscopic   CHOLECYSTECTOMY     EUS N/A 04/14/2013   Procedure: UPPER ENDOSCOPIC ULTRASOUND (EUS) LINEAR;  Surgeon: Rachael Fee, MD;  Location: [facility];  Service: Endoscopy;  Laterality: N/A;   HERNIA REPAIR  2017   ROUX-EN-Y GASTRIC BYPASS     ROUX-EN-Y PROCEDURE     stent to pancreatic cyst that became infected within 36 hours   Past Medical History:  Diagnosis Date   Abdominal hernia    Adrenal insufficiency (HCC) 04/23/2013   ??   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  Opioid  Risk Score:   Fall Risk Score:  `1  Depression screen PHQ 2/9     03/26/2023   10:00 AM 01/19/2023   10:22 AM 11/24/2022    9:59 AM 09/05/2022   10:47 AM 06/27/2022   10:00 AM 04/11/2022  10:10 AM 11/25/2021   10:12 AM  Depression screen PHQ 2/9  Decreased Interest 1 1 1  0 0 0 3  Down, Depressed, Hopeless 1 1 1  0 0 0 3  PHQ - 2 Score 2 2 2  0 0 0 6      Review of Systems  Musculoskeletal:  Positive for back pain, gait problem and neck pain.  All other systems reviewed and are negative.     Objective:   Physical Exam Vitals and nursing note reviewed.  Musculoskeletal:     Comments: No Physical Exam Perform: Telephone visit         Assessment & Plan:  1. MS: In the process of obtaining a neurologist.  Rheumatology following  Continue to Monitor. 12/ 23/2024 2. 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 Refilled: Oxycodone 10 mg one tablet every 6 hours as needed. #120. Second script sent for the following month. 08/10/2023. We will continue the opioid monitoring program, this consists of regular clinic visits, examinations, urine drug screen, pill counts as well as use of [place] Controlled Substance Reporting system. A 12 month History has been reviewed on the [place] Controlled Substance Reporting System on 08/10/2023 3. Chronic Low Back pain without sciatica/ Low back pain with  Radiculopathy RLE/: Continued  topamax.08/10/2023  4. Chronic pancreatitis: PCP and Gastroenterologist Following. 08/10/2023   5. Insomnia: Continue Pamelor. Continue to Monitor.08/10/2023 6. PTSD: Psychiatry and Dr. Kieth Brightly following. . 08/10/2023. 7. Lupus: Rheumatology Following. 08/10/2023 8. Chronic Migraine/ Vascular Headaches: Continue Imitrex,Topamax and Aimovig.Continue to Monitor. 08/10/2023 9. Bilateral  Knee Pain: . Continue HEP as Tolerated. Continue to Monitor. 06/12/2023 10. Muscle Spasm: Continue  Baclofen/ Robaxin. Continue to Monitor. 06/12/2023 11. Cervicalgia: No complaints today. Continue HEP as tolerated. Continue to monitor. 06/12/2023 12. Polyarthralgia: Continue current medication regimen. Continue to monitor. 06/12/2023 13. Right Sided Weakness: Continue HEP as Tolerated. Continue to Monitor. 06/12/2023     F/U in 2 months.  She was scheduled for Virtual Visit: Audio connection not working, visit changed to Telephone Visit Location of patient: In her home Location of Provider: In the Office  Total Time Spent: 10 Minutes F/U in 2 months

## 2023-08-20 ENCOUNTER — Other Ambulatory Visit: Payer: Self-pay | Admitting: Registered Nurse

## 2023-08-20 DIAGNOSIS — G43719 Chronic migraine without aura, intractable, without status migrainosus: Secondary | ICD-10-CM

## 2023-09-23 ENCOUNTER — Other Ambulatory Visit: Payer: Self-pay | Admitting: Registered Nurse

## 2023-10-12 ENCOUNTER — Ambulatory Visit: Payer: Medicaid Other | Admitting: Registered Nurse

## 2023-10-13 ENCOUNTER — Encounter: Payer: Medicaid Other | Admitting: Registered Nurse

## 2023-10-26 ENCOUNTER — Encounter: Payer: Medicaid Other | Attending: Registered Nurse | Admitting: Registered Nurse

## 2023-10-26 VITALS — BP 130/86 | HR 87 | Ht 63.0 in | Wt 228.6 lb

## 2023-10-26 DIAGNOSIS — Z5181 Encounter for therapeutic drug level monitoring: Secondary | ICD-10-CM

## 2023-10-26 DIAGNOSIS — G43719 Chronic migraine without aura, intractable, without status migrainosus: Secondary | ICD-10-CM

## 2023-10-26 DIAGNOSIS — M329 Systemic lupus erythematosus, unspecified: Secondary | ICD-10-CM

## 2023-10-26 DIAGNOSIS — R531 Weakness: Secondary | ICD-10-CM | POA: Diagnosis present

## 2023-10-26 DIAGNOSIS — Z79899 Other long term (current) drug therapy: Secondary | ICD-10-CM | POA: Diagnosis present

## 2023-10-26 DIAGNOSIS — G35 Multiple sclerosis: Secondary | ICD-10-CM | POA: Diagnosis present

## 2023-10-26 DIAGNOSIS — G894 Chronic pain syndrome: Secondary | ICD-10-CM

## 2023-10-26 MED ORDER — OXYCODONE HCL 10 MG PO TABS
10.0000 mg | ORAL_TABLET | Freq: Four times a day (QID) | ORAL | 0 refills | Status: DC | PRN
Start: 2023-10-26 — End: 2023-10-26

## 2023-10-26 MED ORDER — TOPIRAMATE 100 MG PO TABS: ORAL_TABLET | ORAL | 2 refills | Status: DC

## 2023-10-26 MED ORDER — SUMATRIPTAN SUCCINATE 100 MG PO TABS: ORAL_TABLET | ORAL | 1 refills | Status: DC

## 2023-10-26 MED ORDER — OXYCODONE HCL 10 MG PO TABS: 10.0000 mg | ORAL_TABLET | Freq: Four times a day (QID) | ORAL | 0 refills | Status: DC | PRN

## 2023-10-26 MED ORDER — QUETIAPINE FUMARATE 100 MG PO TABS: 100.0000 mg | ORAL_TABLET | Freq: Every day | ORAL | 1 refills | Status: DC

## 2023-10-26 MED ORDER — BACLOFEN 10 MG PO TABS: ORAL_TABLET | ORAL | 2 refills | Status: DC

## 2023-10-26 MED ORDER — METHOCARBAMOL 500 MG PO TABS: 500.0000 mg | ORAL_TABLET | Freq: Every day | ORAL | 1 refills | Status: DC

## 2023-10-26 NOTE — Progress Notes (Signed)
 Subjective:    Patient ID: [name], female    DOB: 06-11-1976, 48 y.o.   MRN: [id number]  HPI: VINCY FELIZ is a 48 y.o. female who returns for follow up appointment for chronic pain and medication refill. She states her pain is located in her bilateral knees and generalized joint pain. He rates his pain 5. His current exercise regime is walking and performing stretching exercises.  Lavonne Chick. Bruney Morphine equivalent is 60.00 MME She  is also prescribed Clonazepam .We have discussed the black box warning of using opioids and benzodiazepines. I highlighted the dangers of using these drugs together and discussed the adverse events including respiratory suppression, overdose, cognitive impairment and importance of compliance with current regimen. We will continue to monitor and adjust as indicated.   Oral Swab was Performed today.     Pain Inventory Average Pain 4 Pain Right Now 5 My pain is constant, sharp, burning, dull, stabbing, tingling, and aching  In the last 24 hours, has pain interfered with the following? General activity 5 Relation with others 4 Enjoyment of life 7 What TIME of day is your pain at its worst? morning , daytime, evening, and night Sleep (in general) Fair  Pain is worse with: walking, bending, and some activites Pain improves with: rest and medication Relief from Meds: 8  Family History  Problem Relation Age of Onset   Diabetes Mother    Hypertension Mother    Cervical cancer Mother    Heart disease Mother    Diabetes Father    Hypertension Father    Heart disease Father    Colitis Maternal Aunt    Diabetes Other        many family members   CVA Maternal Grandmother    Lupus Neg Hx    Sarcoidosis Neg Hx    Pancreatitis Neg Hx    Ataxia Neg Hx    Chorea Neg Hx    Dementia Neg Hx    Mental retardation Neg Hx    Migraines Neg Hx    Multiple sclerosis Neg Hx    Neurofibromatosis Neg Hx    Neuropathy Neg Hx    Parkinsonism Neg Hx     Seizures Neg Hx    Stroke Neg Hx    Colon cancer Neg Hx    Social History   Socioeconomic History   Marital status: Significant Other    Spouse name: Not on file   Number of children: 0   Years of education: Not on file   Highest education level: Not on file  Occupational History   Not on file  Tobacco Use   Smoking status: Former    Current packs/day: 0.00    Average packs/day: 0.5 packs/day for 20.0 years (10.0 ttl pk-yrs)    Types: Cigarettes    Start date: 05/22/1993    Quit date: 05/22/2013    Years since quitting: 10.4   Smokeless tobacco: Never  Vaping Use   Vaping status: Some Days   Substances: Nicotine  Substance and Sexual Activity   Alcohol use: No    Alcohol/week: 0.0 standard drinks of alcohol   Drug use: No   Sexual activity: Never    Birth control/protection: Abstinence  Other Topics Concern   Not on file  Social History Narrative   ** Merged History Encounter **       Lives permanently in [place] with mom and stepfather.  Has not started working yet and was unemployed in [place].  Social Drivers of Corporate investment banker Strain: Not on file  Food Insecurity: Low Risk  (04/08/2023)   Received from [facility]   Hunger Vital Sign    Worried About Running Out of Food in the Last Year: Never true    Ran Out of Food in the Last Year: Never true  Transportation Needs: Not on file (04/08/2023)  Physical Activity: Not on file  Stress: Not on file  Social Connections: Unknown (12/23/2021)   [facility]   Social Network    Social Network: Not on file   Past Surgical History:  Procedure Laterality Date   ABDOMINAL SURGERY  ~ 2007   some sort of pancreatic cyst drainage.    ABDOMINAL SURGERY     ADENOIDECTOMY     CHOLECYSTECTOMY     ~ age 60-laparoscopic   CHOLECYSTECTOMY     EUS N/A 04/14/2013   Procedure: UPPER ENDOSCOPIC ULTRASOUND (EUS) LINEAR;  Surgeon: Rachael Fee, MD;  Location: [facility];   Service: Endoscopy;  Laterality: N/A;   HERNIA REPAIR  2017   ROUX-EN-Y GASTRIC BYPASS     ROUX-EN-Y PROCEDURE     stent to pancreatic cyst that became infected within 36 hours   Past Surgical History:  Procedure Laterality Date   ABDOMINAL SURGERY  ~ 2007   some sort of pancreatic cyst drainage.    ABDOMINAL SURGERY     ADENOIDECTOMY     CHOLECYSTECTOMY     ~ age 60-laparoscopic   CHOLECYSTECTOMY     EUS N/A 04/14/2013   Procedure: UPPER ENDOSCOPIC ULTRASOUND (EUS) LINEAR;  Surgeon: Rachael Fee, MD;  Location: [facility];  Service: Endoscopy;  Laterality: N/A;   HERNIA REPAIR  2017   ROUX-EN-Y GASTRIC BYPASS     ROUX-EN-Y PROCEDURE     stent to pancreatic cyst that became infected within 36 hours   Past Medical History:  Diagnosis Date   Abdominal hernia    Adrenal insufficiency (HCC) 04/23/2013   ??   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    Pancreatic cyst 2002 onset   Pancreatitis 03/24/2013   2002, 2'2013, 03-14-13   PTSD (post-traumatic stress disorder)    Ventral hernia    BP 130/86   Pulse 87   Ht 5\' 3"  (1.6 m)   Wt 228 lb 9.6 oz (103.7 kg)   SpO2 98%   BMI 40.49 kg/m   Opioid Risk Score:   Fall Risk Score:  `1  Depression screen [facility] 2/9     10/26/2023   11:49 AM 08/10/2023   12:54 PM 03/26/2023   10:00 AM 01/19/2023   10:22 AM 11/24/2022    9:59 AM 09/05/2022   10:47 AM 06/27/2022   10:00 AM  Depression screen PHQ 2/9  Decreased Interest 1 0 1 1 1  0 0  Down, Depressed, Hopeless 1 0 1 1 1  0 0  PHQ - 2 Score 2 0 2 2 2  0 0      Review of Systems  All other systems reviewed and are negative.      Objective:   Physical Exam Vitals and nursing note reviewed.  Constitutional:      Appearance: Normal appearance.  Cardiovascular:     Rate and Rhythm: Normal rate and regular rhythm.     Pulses: Normal pulses.     Heart sounds: Normal heart sounds.  Pulmonary:     Effort: Pulmonary effort is normal.     Breath sounds: Normal breath sounds.  Musculoskeletal:     Comments: Normal Muscle Bulk and Muscle Testing Reveals: Upper Extremities: Right: Decreased ROM 90 Degrees and Muscle Strength 3/5 Left Upper Extremity: Full ROM and Muscle Strength 5/5 Lumbar Paraspinal Tenderness: L-4-L-5 Bilateral Greater Trochanter Tenderness Lower Extremities: Full ROM and Muscle Strength 5/5 Arises from Table with Ease Narrow Based  Gait     Skin:    General: Skin is warm and dry.  Neurological:     Mental Status: She is alert and oriented to person, place, and time.  Psychiatric:        Mood and Affect: Mood normal.        Behavior: Behavior normal.         Assessment & Plan:  1. MS: In the process of obtaining a neurologist.  Rheumatology following  Continue to Monitor. 03/ 05/2024 2. 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 Refilled: Oxycodone 10 mg one tablet every 6 hours as needed. #120. Second script sent for the following month. 10/26/2023 We will continue the opioid monitoring program, this consists of regular clinic visits, examinations, urine drug screen, pill counts as well as use of [place] Controlled Substance Reporting system. A 12 month History has been reviewed on the [place] Controlled Substance Reporting System on 10/26/2023 3. Chronic Low Back pain without sciatica/ Low back pain with  Radiculopathy RLE/: Continued  topamax.10/26/2023  4. Chronic pancreatitis: PCP and Gastroenterologist Following. 10/26/2023   5. Insomnia: Continue Pamelor. Continue to Monitor.10/26/2023 6. PTSD: Psychiatry and Dr. Kieth Brightly following. Marland Kitchen 10/26/2023. 7. Lupus:  Rheumatology Following. 10/26/2023 8. Chronic Migraine/ Vascular Headaches: Continue Imitrex,Topamax and Aimovig.Continue to Monitor. 10/26/2023 9. Bilateral  Knee Pain: . Continue HEP as Tolerated. Continue to Monitor. 10/26/2023 10. Muscle Spasm: Continue Baclofen/ Robaxin. Continue to Monitor. 10/26/2023 11. Cervicalgia: No complaints today. Continue HEP as tolerated. Continue to monitor. 10/26/2023 12. Polyarthralgia: Continue current medication regimen. Continue to monitor. 10/26/2023 13. Right Sided Weakness: Continue HEP as Tolerated. Continue to Monitor. 10/26/2023     F/U in 2 months.

## 2023-10-30 ENCOUNTER — Telehealth: Payer: Self-pay

## 2023-10-30 LAB — DRUG TOX MONITOR 1 W/CONF, ORAL FLD
Amphetamines: NEGATIVE ng/mL (ref <10)
Barbiturates: NEGATIVE ng/mL (ref <10)
Benzodiazepines: NEGATIVE ng/mL (ref <0.50)
Buprenorphine: NEGATIVE ng/mL (ref <0.10)
Cocaine: NEGATIVE ng/mL (ref <5.0)
Codeine: NEGATIVE ng/mL (ref <2.5)
Cotinine: 209.9 ng/mL — ABNORMAL HIGH (ref <5.0)
Dihydrocodeine: NEGATIVE ng/mL (ref <2.5)
Fentanyl: NEGATIVE ng/mL (ref <0.10)
Heroin Metabolite: NEGATIVE ng/mL (ref <1.0)
Hydrocodone: NEGATIVE ng/mL (ref <2.5)
Hydromorphone: NEGATIVE ng/mL (ref <2.5)
MARIJUANA: NEGATIVE ng/mL (ref <2.5)
MDMA: NEGATIVE ng/mL (ref <10)
Meprobamate: NEGATIVE ng/mL (ref <2.5)
Methadone: NEGATIVE ng/mL (ref <5.0)
Morphine: NEGATIVE ng/mL (ref <2.5)
Nicotine Metabolite: POSITIVE ng/mL — AB (ref <5.0)
Norhydrocodone: NEGATIVE ng/mL (ref <2.5)
Noroxycodone: 41.1 ng/mL — ABNORMAL HIGH (ref <2.5)
Opiates: POSITIVE ng/mL — AB (ref <2.5)
Oxycodone: 136.1 ng/mL — ABNORMAL HIGH (ref <2.5)
Oxymorphone: NEGATIVE ng/mL (ref <2.5)
Phencyclidine: NEGATIVE ng/mL (ref <10)
Tapentadol: NEGATIVE ng/mL (ref <5.0)
Tramadol: NEGATIVE ng/mL (ref <5.0)
Zolpidem: NEGATIVE ng/mL (ref <5.0)

## 2023-10-30 LAB — DRUG TOX ALC METAB W/CON, ORAL FLD: Alcohol Metabolite: NEGATIVE ng/mL (ref <25)

## 2023-10-30 NOTE — Telephone Encounter (Signed)
(  Key: [id number]) PA Case ID #: [id number] Rx #: [id number] Submitted for Quetiapine

## 2023-11-01 ENCOUNTER — Other Ambulatory Visit: Payer: Self-pay | Admitting: Physical Medicine & Rehabilitation

## 2023-11-01 ENCOUNTER — Encounter: Payer: Self-pay | Admitting: Registered Nurse

## 2023-11-02 ENCOUNTER — Telehealth: Payer: Self-pay

## 2023-11-02 NOTE — Telephone Encounter (Signed)
 Prior auth for oxycodone  submitted to [employer] via CoverMyMeds.  Marshell Garfinkel (Key: [id number]) PA Case ID #: [id number]

## 2023-11-02 NOTE — Telephone Encounter (Signed)
 Patient sent a myChart stating that her Oxy needs a PA.

## 2023-11-03 ENCOUNTER — Encounter: Payer: Self-pay | Admitting: *Deleted

## 2023-11-03 ENCOUNTER — Telehealth: Payer: Self-pay | Admitting: Registered Nurse

## 2023-11-03 MED ORDER — CLONAZEPAM 0.5 MG PO TABS: 0.5000 mg | ORAL_TABLET | Freq: Two times a day (BID) | ORAL | 3 refills | Status: DC | PRN

## 2023-11-03 NOTE — Telephone Encounter (Signed)
 PMP was Reviewed.  Clonazepam e-scribed to pharmacy.  Call placed to [facility] regarding the above, she verbalizes understanding.

## 2023-11-03 NOTE — Telephone Encounter (Signed)
 Outcome Approved today by [employer] Medicaid 2017 Approved. OXYCODONE HCL 10MG  Tablet is approved from 11/03/2023 to 05/05/2024. Effective Date: 11/03/2023 Authorization Expiration Date: 05/05/2024

## 2023-11-10 NOTE — Telephone Encounter (Signed)
 Approved 10/30/23-10/29/24

## 2023-12-10 ENCOUNTER — Other Ambulatory Visit: Payer: Self-pay | Admitting: Registered Nurse

## 2023-12-10 DIAGNOSIS — G894 Chronic pain syndrome: Secondary | ICD-10-CM

## 2023-12-22 NOTE — Progress Notes (Unsigned)
 Subjective:    Patient ID: Mary [name], female    DOB: 28-Dec-1975, 48 y.o.   MRN: [id number]  HPI: Mary [name] is a 49 y.o. female who returns for follow up appointment for chronic pain and medication refill. She states her pain is located in her bilateral shoulders L>R, lower back and bilateral knee pain. She also reports she has a migraine and generalized joint pain.She rates her pain 6. Her current exercise regime is walking and performing stretching exercises.  Kasheena Morphine equivalent is 60.00 MME. She is also prescribed Clonazepam  .We have discussed the black box warning of using opioids and benzodiazepines. I highlighted the dangers of using these drugs together and discussed the adverse events including respiratory suppression, overdose, cognitive impairment and importance of compliance with current regimen. We will continue to monitor and adjust as indicated.   Last Oral Swab was Performed on 10/26/2023, it was consistent for Oxycodone .      Pain Inventory Average Pain 4 Pain Right Now 6 My pain is constant, sharp, burning, dull, stabbing, tingling, and aching  In the last 24 hours, has pain interfered with the following? General activity 7 Relation with others 4 Enjoyment of life 7 What TIME of day is your pain at its worst? morning , evening, and night Sleep (in general) Fair  Pain is worse with: bending and some activites Pain improves with: rest and medication Relief from Meds: 5  Family History  Problem Relation Age of Onset   Diabetes Mother    Hypertension Mother    Cervical cancer Mother    Heart disease Mother    Diabetes Father    Hypertension Father    Heart disease Father    Colitis Maternal Aunt    Diabetes Other        many family members   CVA Maternal Grandmother    Lupus Neg Hx    Sarcoidosis Neg Hx    Pancreatitis Neg Hx    Ataxia Neg Hx    Chorea Neg Hx    Dementia Neg Hx    Mental retardation Neg Hx    Migraines Neg Hx     Multiple sclerosis Neg Hx    Neurofibromatosis Neg Hx    Neuropathy Neg Hx    Parkinsonism Neg Hx    Seizures Neg Hx    Stroke Neg Hx    Colon cancer Neg Hx    Social History   Socioeconomic History   Marital status: Significant Other    Spouse name: Not on file   Number of children: 0   Years of education: Not on file   Highest education level: Not on file  Occupational History   Not on file  Tobacco Use   Smoking status: Former    Current packs/day: 0.00    Average packs/day: 0.5 packs/day for 20.0 years (10.0 ttl pk-yrs)    Types: Cigarettes    Start date: 05/22/1993    Quit date: 05/22/2013    Years since quitting: 10.5   Smokeless tobacco: Never  Vaping Use   Vaping status: Some Days   Substances: Nicotine   Substance and Sexual Activity   Alcohol  use: No    Alcohol /week: 0.0 standard drinks of alcohol    Drug use: No   Sexual activity: Never    Birth control/protection: Abstinence  Other Topics Concern   Not on file  Social History Narrative   ** Merged History Encounter **       Lives permanently in [place] with  mom and stepfather.  Has not started working yet and was unemployed in [place] .         Social Drivers of Corporate investment banker Strain: Not on file  Food Insecurity: Low Risk  (04/08/2023)   Received from [facility]   Hunger Vital Sign    Worried About Running Out of Food in the Last Year: Never true    Ran Out of Food in the Last Year: Never true  Transportation Needs: Not on file (04/08/2023)  Physical Activity: Not on file  Stress: Not on file  Social Connections: Unknown (12/23/2021)   Received from [facility], [facility]   Social Network    Social Network: Not on file   Past Surgical History:  Procedure Laterality Date   ABDOMINAL SURGERY  ~ 2007   some sort of pancreatic cyst drainage.    ABDOMINAL SURGERY     ADENOIDECTOMY     CHOLECYSTECTOMY     ~ age 63-laparoscopic   CHOLECYSTECTOMY     EUS N/A 04/14/2013   Procedure:  UPPER ENDOSCOPIC ULTRASOUND (EUS) LINEAR;  Surgeon: Janel Medford, MD;  Location: [facility];  Service: Endoscopy;  Laterality: N/A;   HERNIA REPAIR  2017   ROUX-EN-Y GASTRIC BYPASS     ROUX-EN-Y PROCEDURE     stent to pancreatic cyst that became infected within 36 hours   Past Surgical History:  Procedure Laterality Date   ABDOMINAL SURGERY  ~ 2007   some sort of pancreatic cyst drainage.    ABDOMINAL SURGERY     ADENOIDECTOMY     CHOLECYSTECTOMY     ~ age 63-laparoscopic   CHOLECYSTECTOMY     EUS N/A 04/14/2013   Procedure: UPPER ENDOSCOPIC ULTRASOUND (EUS) LINEAR;  Surgeon: Janel Medford, MD;  Location: [facility];  Service: Endoscopy;  Laterality: N/A;   HERNIA REPAIR  2017   ROUX-EN-Y GASTRIC BYPASS     ROUX-EN-Y PROCEDURE     stent to pancreatic cyst that became infected within 36 hours   Past Medical History:  Diagnosis Date   Abdominal hernia    Adrenal insufficiency (HCC) 04/23/2013   ??   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    Pancreatic cyst 2002 onset   Pancreatitis 03/24/2013   2002, 2'2013, 03-14-13   PTSD (post-traumatic stress disorder)    Ventral hernia    BP 109/74   Pulse 72   Ht 5\' 3"  (1.6 m)   Wt 235 lb 3.2 oz (106.7 kg)   SpO2 97%   BMI 41.66 kg/m   Opioid Risk Score:   Fall Risk Score:  `1  Depression screen [facility] 2/9     12/23/2023   10:54 AM 10/26/2023   11:49 AM 08/10/2023   12:54 PM 03/26/2023   10:00 AM 01/19/2023   10:22 AM 11/24/2022    9:59 AM 09/05/2022   10:47 AM  Depression screen PHQ 2/9  Decreased Interest 1 1 0 1 1 1  0  Down, Depressed, Hopeless 1 1 0 1 1 1  0  PHQ - 2 Score 2 2 0 2 2 2  0    Review of Systems  Musculoskeletal:  Positive for  back  pain and neck pain.       Knee pan and ankle pain right  All other systems reviewed and are negative.      Objective:   Physical Exam Vitals and nursing note reviewed.  Constitutional:      Appearance: Normal appearance. She is obese.  Cardiovascular:     Rate and Rhythm: Normal rate and regular rhythm.     Pulses: Normal pulses.     Heart sounds: Normal heart sounds.  Pulmonary:     Effort: Pulmonary effort is normal.     Breath sounds: Normal breath sounds.  Musculoskeletal:     Comments: Normal Muscle Bulk and Muscle Testing Reveals:  Upper Extremities: Right: Full ROM and Muscle Strength 5/5 Left Upper Extremity: Decreased ROM 90 Degrees and Muscle Strength 5/5  Lumbar Paraspinal Tenderness: L-4-L-5 Lower Extremities: Full ROM and Muscle Strength 5/5 Arises from Table slowly Narrow Based  Gait     Skin:    General: Skin is warm and dry.  Neurological:     Mental Status: She is alert and oriented to person, place, and time.  Psychiatric:        Mood and Affect: Mood normal.        Behavior: Behavior normal.         Assessment & Plan:  1. MS: In the process of obtaining a neurologist.  Rheumatology following  Continue to Monitor. 12/23/2023 2. 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 Refilled: Oxycodone  10 mg one tablet every 6 hours as needed. #120. Second script sent for the following month. 12/23/2023 We will continue the opioid monitoring program, this consists of regular clinic visits, examinations, urine drug screen, pill counts as well as use of [place]  Controlled Substance Reporting system. A 12 month History has been reviewed on the   Controlled Substance Reporting System on 12/23/2023 3. Chronic Low Back pain without sciatica/ Low back pain with  Radiculopathy RLE/: Continued  topamax .12/23/2023  4. Chronic pancreatitis: PCP and Gastroenterologist Following. 12/23/2023    5. Insomnia: Continue Pamelor . Continue to Monitor.12/23/2023 6. PTSD: Psychiatry and Dr. Cheryll Corti following. Aaron Aas 12/23/2023. 7. Lupus: Rheumatology Following. 12/23/2023 8. Chronic Migraine/ Vascular Headaches: Continue Imitrex ,Topamax  and Aimovig .Continue to Monitor. 12/23/2023 9. Bilateral  Knee Pain: . Continue HEP as Tolerated. Continue to Monitor. 12/23/2023 10. Muscle Spasm: Continue Baclofen / Robaxin . Continue to Monitor. 12/23/2023 11. Cervicalgia: No complaints today. Continue HEP as tolerated. Continue to monitor. 12/23/2023 12. Polyarthralgia: Continue current medication regimen. Continue to monitor. 12/23/2023 13. Right Sided Weakness: Continue HEP as Tolerated. Continue to Monitor. 12/23/2023     F/U in 2 months.

## 2023-12-23 ENCOUNTER — Encounter: Payer: Self-pay | Admitting: Registered Nurse

## 2023-12-23 ENCOUNTER — Encounter: Attending: Registered Nurse | Admitting: Registered Nurse

## 2023-12-23 VITALS — BP 109/74 | HR 72 | Ht 63.0 in | Wt 235.2 lb

## 2023-12-23 DIAGNOSIS — F4310 Post-traumatic stress disorder, unspecified: Secondary | ICD-10-CM | POA: Diagnosis present

## 2023-12-23 DIAGNOSIS — M25512 Pain in left shoulder: Secondary | ICD-10-CM | POA: Diagnosis not present

## 2023-12-23 DIAGNOSIS — G8929 Other chronic pain: Secondary | ICD-10-CM | POA: Insufficient documentation

## 2023-12-23 DIAGNOSIS — M545 Low back pain, unspecified: Secondary | ICD-10-CM | POA: Diagnosis present

## 2023-12-23 DIAGNOSIS — F331 Major depressive disorder, recurrent, moderate: Secondary | ICD-10-CM | POA: Diagnosis present

## 2023-12-23 DIAGNOSIS — M329 Systemic lupus erythematosus, unspecified: Secondary | ICD-10-CM | POA: Insufficient documentation

## 2023-12-23 DIAGNOSIS — G35 Multiple sclerosis: Secondary | ICD-10-CM | POA: Diagnosis present

## 2023-12-23 DIAGNOSIS — Z79899 Other long term (current) drug therapy: Secondary | ICD-10-CM | POA: Insufficient documentation

## 2023-12-23 DIAGNOSIS — M2550 Pain in unspecified joint: Secondary | ICD-10-CM | POA: Diagnosis present

## 2023-12-23 DIAGNOSIS — Z5181 Encounter for therapeutic drug level monitoring: Secondary | ICD-10-CM | POA: Diagnosis present

## 2023-12-23 DIAGNOSIS — G441 Vascular headache, not elsewhere classified: Secondary | ICD-10-CM | POA: Diagnosis present

## 2023-12-23 DIAGNOSIS — G894 Chronic pain syndrome: Secondary | ICD-10-CM | POA: Diagnosis present

## 2023-12-23 DIAGNOSIS — M25511 Pain in right shoulder: Secondary | ICD-10-CM | POA: Diagnosis present

## 2023-12-23 DIAGNOSIS — G43719 Chronic migraine without aura, intractable, without status migrainosus: Secondary | ICD-10-CM | POA: Insufficient documentation

## 2023-12-23 MED ORDER — OXYCODONE HCL 10 MG PO TABS: 10.0000 mg | ORAL_TABLET | Freq: Four times a day (QID) | ORAL | 0 refills | Status: DC | PRN

## 2023-12-30 ENCOUNTER — Other Ambulatory Visit: Payer: Self-pay | Admitting: Registered Nurse

## 2023-12-30 DIAGNOSIS — G43719 Chronic migraine without aura, intractable, without status migrainosus: Secondary | ICD-10-CM

## 2024-01-04 ENCOUNTER — Encounter: Payer: Medicaid Other | Admitting: Psychology

## 2024-01-04 DIAGNOSIS — F4310 Post-traumatic stress disorder, unspecified: Secondary | ICD-10-CM

## 2024-01-04 DIAGNOSIS — G43719 Chronic migraine without aura, intractable, without status migrainosus: Secondary | ICD-10-CM | POA: Diagnosis not present

## 2024-01-04 DIAGNOSIS — F331 Major depressive disorder, recurrent, moderate: Secondary | ICD-10-CM

## 2024-01-04 DIAGNOSIS — G441 Vascular headache, not elsewhere classified: Secondary | ICD-10-CM | POA: Diagnosis not present

## 2024-01-04 DIAGNOSIS — G894 Chronic pain syndrome: Secondary | ICD-10-CM

## 2024-01-04 DIAGNOSIS — G35 Multiple sclerosis: Secondary | ICD-10-CM
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
## 2024-01-21 ENCOUNTER — Other Ambulatory Visit: Payer: Self-pay | Admitting: Registered Nurse

## 2024-01-21 DIAGNOSIS — G43719 Chronic migraine without aura, intractable, without status migrainosus: Secondary | ICD-10-CM

## 2024-01-27 ENCOUNTER — Telehealth: Payer: Self-pay

## 2024-01-27 NOTE — Telephone Encounter (Signed)
 Fax was sent to insurance for submitted PA, pt. Was not found.

## 2024-01-28 NOTE — Telephone Encounter (Signed)
 Approved from 01/27/24 to 01/26/2025

## 2024-03-07 ENCOUNTER — Encounter: Admitting: Registered Nurse

## 2024-03-23 ENCOUNTER — Encounter: Attending: Registered Nurse | Admitting: Registered Nurse

## 2024-03-23 VITALS — BP 107/70 | HR 89 | Ht 63.0 in | Wt 239.0 lb

## 2024-03-23 DIAGNOSIS — G35 Multiple sclerosis: Secondary | ICD-10-CM | POA: Insufficient documentation

## 2024-03-23 DIAGNOSIS — M545 Low back pain, unspecified: Secondary | ICD-10-CM | POA: Diagnosis present

## 2024-03-23 DIAGNOSIS — Z5181 Encounter for therapeutic drug level monitoring: Secondary | ICD-10-CM | POA: Diagnosis present

## 2024-03-23 DIAGNOSIS — G609 Hereditary and idiopathic neuropathy, unspecified: Secondary | ICD-10-CM | POA: Diagnosis present

## 2024-03-23 DIAGNOSIS — M329 Systemic lupus erythematosus, unspecified: Secondary | ICD-10-CM | POA: Insufficient documentation

## 2024-03-23 DIAGNOSIS — G8929 Other chronic pain: Secondary | ICD-10-CM | POA: Insufficient documentation

## 2024-03-23 DIAGNOSIS — G894 Chronic pain syndrome: Secondary | ICD-10-CM | POA: Diagnosis present

## 2024-03-23 DIAGNOSIS — Z79899 Other long term (current) drug therapy: Secondary | ICD-10-CM | POA: Insufficient documentation

## 2024-03-23 DIAGNOSIS — M25511 Pain in right shoulder: Secondary | ICD-10-CM | POA: Insufficient documentation

## 2024-03-23 MED ORDER — CLONAZEPAM 0.5 MG PO TABS: 0.5000 mg | ORAL_TABLET | Freq: Two times a day (BID) | ORAL | 3 refills | Status: DC | PRN

## 2024-03-23 MED ORDER — OXYCODONE HCL 10 MG PO TABS: 10.0000 mg | ORAL_TABLET | Freq: Four times a day (QID) | ORAL | 0 refills | Status: DC | PRN

## 2024-03-23 MED ORDER — OXYCODONE HCL 10 MG PO TABS
10.0000 mg | ORAL_TABLET | Freq: Four times a day (QID) | ORAL | 0 refills | Status: DC | PRN
Start: 2024-03-23 — End: 2024-03-23

## 2024-03-23 NOTE — Telephone Encounter (Signed)
 SABRA

## 2024-03-23 NOTE — Progress Notes (Signed)
 Subjective:    Patient ID: Mary [name], female    DOB: February 20, 1976, 48 y.o.   MRN: [id number]  HPI: Mary [name] is a 48 y.o. female who returns for follow up appointment for chronic pain and medication refill. She states her pain is located in her right shoulder, lower back and bilateral hands and bilateral feet with tingling and burning. She rates her pain 5. Her current exercise regime is walking , she was encouraged to increase HEP as Tolerated, she verbalizes understanding.   Bernadene Sidney Morphine equivalent is 60.00 MME. She is also prescribed Clonazepam   .We have discussed the black box warning of using opioids and benzodiazepines. I highlighted the dangers of using these drugs together and discussed the adverse events including respiratory suppression, overdose, cognitive impairment and importance of compliance with current regimen. We will continue to monitor and adjust as indicated.    Oral Swab was Performed today.    Pain Inventory Average Pain 7 Pain Right Now 5 My pain is constant, sharp, burning, dull, stabbing, tingling, and aching  In the last 24 hours, has pain interfered with the following? General activity 7 Relation with others 5 Enjoyment of life 7 What TIME of day is your pain at its worst? morning  and night Sleep (in general) Fair  Pain is worse with: walking, bending, and standing Pain improves with: rest and medication Relief from Meds: 5  Family History  Problem Relation Age of Onset   Diabetes Mother    Hypertension Mother    Cervical cancer Mother    Heart disease Mother    Diabetes Father    Hypertension Father    Heart disease Father    Colitis Maternal Aunt    Diabetes Other        many family members   CVA Maternal Grandmother    Lupus Neg Hx    Sarcoidosis Neg Hx    Pancreatitis Neg Hx    Ataxia Neg Hx    Chorea Neg Hx    Dementia Neg Hx    Mental retardation Neg Hx    Migraines Neg Hx    Multiple sclerosis Neg Hx     Neurofibromatosis Neg Hx    Neuropathy Neg Hx    Parkinsonism Neg Hx    Seizures Neg Hx    Stroke Neg Hx    Colon cancer Neg Hx    Social History   Socioeconomic History   Marital status: Significant Other    Spouse name: Not on file   Number of children: 0   Years of education: Not on file   Highest education level: Not on file  Occupational History   Not on file  Tobacco Use   Smoking status: Former    Current packs/day: 0.00    Average packs/day: 0.5 packs/day for 20.0 years (10.0 ttl pk-yrs)    Types: Cigarettes    Start date: 05/22/1993    Quit date: 05/22/2013    Years since quitting: 10.8   Smokeless tobacco: Never  Vaping Use   Vaping status: Some Days   Substances: Nicotine   Substance and Sexual Activity   Alcohol  use: No    Alcohol /week: 0.0 standard drinks of alcohol    Drug use: No   Sexual activity: Never    Birth control/protection: Abstinence  Other Topics Concern   Not on file  Social History Narrative   ** Merged History Encounter **       Lives permanently in [place] with mom and stepfather.  Has not started working yet and was unemployed in [place] .         Social Drivers of Corporate investment banker Strain: Not on file  Food Insecurity: Low Risk  (02/03/2024)   Received from [facility]   Hunger Vital Sign    Within the past 12 months, you worried that your food would run out before you got money to buy more: Never true    Within the past 12 months, the food you bought just didn't last and you didn't have money to get more. : Never true  Transportation Needs: Not on file (02/03/2024)  Physical Activity: Not on file  Stress: Not on file  Social Connections: Unknown (12/23/2021)   Received from [facility]   Social Network    Social Network: Not on file   Past Surgical History:  Procedure Laterality Date   ABDOMINAL SURGERY  ~ 2007   some sort of pancreatic cyst drainage.    ABDOMINAL SURGERY     ADENOIDECTOMY     CHOLECYSTECTOMY     ~  age 63-laparoscopic   CHOLECYSTECTOMY     EUS N/A 04/14/2013   Procedure: UPPER ENDOSCOPIC ULTRASOUND (EUS) LINEAR;  Surgeon: Toribio SHAUNNA Cedar, MD;  Location: [facility];  Service: Endoscopy;  Laterality: N/A;   HERNIA REPAIR  2017   ROUX-EN-Y GASTRIC BYPASS     ROUX-EN-Y PROCEDURE     stent to pancreatic cyst that became infected within 36 hours   Past Surgical History:  Procedure Laterality Date   ABDOMINAL SURGERY  ~ 2007   some sort of pancreatic cyst drainage.    ABDOMINAL SURGERY     ADENOIDECTOMY     CHOLECYSTECTOMY     ~ age 63-laparoscopic   CHOLECYSTECTOMY     EUS N/A 04/14/2013   Procedure: UPPER ENDOSCOPIC ULTRASOUND (EUS) LINEAR;  Surgeon: Toribio SHAUNNA Cedar, MD;  Location: [facility];  Service: Endoscopy;  Laterality: N/A;   HERNIA REPAIR  2017   ROUX-EN-Y GASTRIC BYPASS     ROUX-EN-Y PROCEDURE     stent to pancreatic cyst that became infected within 36 hours   Past Medical History:  Diagnosis Date   Abdominal hernia    Adrenal insufficiency (HCC) 04/23/2013   ??   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  Opioid Risk Score:   Fall Risk Score:  `1  Depression screen [facility] 2/9     12/23/2023   10:54 AM 10/26/2023   11:49 AM 08/10/2023   12:54 PM 03/26/2023   10:00 AM 01/19/2023   10:22 AM 11/24/2022    9:59 AM 09/05/2022   10:47 AM  Depression screen PHQ 2/9  Decreased Interest 1 1 0 1 1 1  0  Down, Depressed, Hopeless 1 1 0 1 1 1  0  PHQ - 2 Score 2 2 0 2 2 2  0    Review of Systems  Musculoskeletal:   Positive for back pain.  R shoulder, both hands, bilateral feet       Objective:   Physical Exam Vitals and nursing note reviewed.  Constitutional:      Appearance: Normal appearance.  Cardiovascular:     Rate and Rhythm: Normal rate and regular rhythm.     Pulses: Normal pulses.     Heart sounds: Normal heart sounds.  Pulmonary:     Effort: Pulmonary effort is normal.     Breath sounds: Normal breath sounds.  Musculoskeletal:     Comments: Normal Muscle Bulk and Muscle Testing Reveals:  Upper Extremities: Decreased ROM  45 Degrees and Muscle Strength 5/5 Left AC Joint Tenderness Lumbar Paraspinal Tenderness: L-4-L-5 Lower Extremities: Full ROM and Muscle Strength 5/5 Bilateral Lower Extremities Flexion Produces Pain into her Bilateral Patella's and Bilateral Ankles  Arises from Table slowly Narrow Based  Gait     Skin:    General: Skin is warm and dry.  Neurological:     Mental Status: She is alert and oriented to person, place, and time.  Psychiatric:        Mood and Affect: Mood normal.        Behavior: Behavior normal.          Assessment & Plan:  1. MS: In the process of obtaining a neurologist.  Rheumatology following  Continue to Monitor. 03/23/2024 2. 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 Refilled: Oxycodone  10 mg one tablet every 6 hours as needed. #120. Second script sent for the following month. 03/23/2024 We will continue the opioid monitoring program, this consists of regular clinic visits, examinations, urine drug screen, pill counts as well as use of [place]  Controlled Substance Reporting system. A 12 month History has been reviewed on the [place]  Controlled Substance Reporting System on 03/23/2024 3. Chronic Low Back pain without sciatica/ Low back pain with  Radiculopathy RLE/: Continued  topamax .03/23/2024  4. Chronic pancreatitis: PCP and Gastroenterologist  Following. 03/23/2024   5. Insomnia: Continue Pamelor . Continue to Monitor.03/23/2024 6. PTSD: Psychiatry and Dr. Corina following. SABRA 03/23/2024. 7. Lupus: Rheumatology Following. 03/23/2024 8. Chronic Migraine/ Vascular Headaches: Continue Imitrex ,Topamax  and Aimovig .Continue to Monitor. 03/23/2024 9. Bilateral  Knee Pain: . Continue HEP as Tolerated. Continue to Monitor. 03/23/2024 10. Muscle Spasm: Continue Baclofen / Robaxin . Continue to Monitor. 03/23/2024 11. Cervicalgia: No complaints today. Continue HEP as tolerated. Continue to monitor. 03/23/2024 12. Polyarthralgia: Continue current medication regimen. Continue to monitor. 03/23/2024 13. Right Sided Weakness: Continue HEP as Tolerated. Continue to Monitor. 03/23/2024     F/U in 2 months.

## 2024-03-26 LAB — DRUG TOX MONITOR 1 W/CONF, ORAL FLD
Alprazolam: NEGATIVE ng/mL (ref <0.50)
Aminoclonazepam: 1.26 ng/mL — ABNORMAL HIGH (ref <0.50)
Amphetamines: NEGATIVE ng/mL (ref <10)
Barbiturates: NEGATIVE ng/mL (ref <10)
Benzodiazepines: POSITIVE ng/mL — AB (ref <0.50)
Buprenorphine: NEGATIVE ng/mL (ref <0.10)
Chlordiazepoxide: NEGATIVE ng/mL (ref <0.50)
Clonazepam: 0.5 ng/mL — ABNORMAL HIGH (ref <0.50)
Cocaine: NEGATIVE ng/mL (ref <5.0)
Codeine: NEGATIVE ng/mL (ref <2.5)
Cotinine: 91.1 ng/mL — ABNORMAL HIGH (ref <5.0)
Diazepam: NEGATIVE ng/mL (ref <0.50)
Dihydrocodeine: NEGATIVE ng/mL (ref <2.5)
Fentanyl: NEGATIVE ng/mL (ref <0.10)
Flunitrazepam: NEGATIVE ng/mL (ref <0.50)
Flurazepam: NEGATIVE ng/mL (ref <0.50)
Heroin Metabolite: NEGATIVE ng/mL (ref <1.0)
Hydrocodone: NEGATIVE ng/mL (ref <2.5)
Hydromorphone: NEGATIVE ng/mL (ref <2.5)
Lorazepam: NEGATIVE ng/mL (ref <0.50)
MARIJUANA: NEGATIVE ng/mL (ref <2.5)
MDMA: NEGATIVE ng/mL (ref <10)
Meprobamate: NEGATIVE ng/mL (ref <2.5)
Methadone: NEGATIVE ng/mL (ref <5.0)
Midazolam: NEGATIVE ng/mL (ref <0.50)
Morphine: NEGATIVE ng/mL (ref <2.5)
Nicotine Metabolite: POSITIVE ng/mL — AB (ref <5.0)
Nordiazepam: NEGATIVE ng/mL (ref <0.50)
Norhydrocodone: NEGATIVE ng/mL (ref <2.5)
Noroxycodone: 18.2 ng/mL — ABNORMAL HIGH (ref <2.5)
Opiates: POSITIVE ng/mL — AB (ref <2.5)
Oxazepam: NEGATIVE ng/mL (ref <0.50)
Oxycodone: 108.3 ng/mL — ABNORMAL HIGH (ref <2.5)
Oxymorphone: NEGATIVE ng/mL (ref <2.5)
Phencyclidine: NEGATIVE ng/mL (ref <10)
Tapentadol: NEGATIVE ng/mL (ref <5.0)
Temazepam: NEGATIVE ng/mL (ref <0.50)
Tramadol: NEGATIVE ng/mL (ref <5.0)
Triazolam: NEGATIVE ng/mL (ref <0.50)
Zolpidem: NEGATIVE ng/mL (ref <5.0)

## 2024-03-26 LAB — DRUG TOX ALC METAB W/CON, ORAL FLD: Alcohol Metabolite: NEGATIVE ng/mL (ref <25)

## 2024-04-07 ENCOUNTER — Encounter: Admitting: Psychology

## 2024-04-24 ENCOUNTER — Other Ambulatory Visit: Payer: Self-pay | Admitting: Registered Nurse

## 2024-05-19 ENCOUNTER — Encounter: Admitting: Registered Nurse

## 2024-05-25 NOTE — Progress Notes (Unsigned)
 Subjective:    Patient ID: Mary [name], female    DOB: 12/30/1975, 48 y.o.   MRN: [id number]  HPI: Mary [name] is a 48 y.o. female who returns for follow up appointment for chronic pain and medication refill. states *** pain is located in  ***. rates pain ***. current exercise regime is walking and performing stretching exercises.  Lavonn Lebon Morphine equivalent is *** MME.   she is also prescribed *** by Dr. PIERRETTE .We have discussed the black box warning of using opioids and benzodiazepines. I highlighted the dangers of using these drugs together and discussed the adverse events including respiratory suppression, overdose, cognitive impairment and importance of compliance with current regimen. We will continue to monitor and adjust as indicated.  she is being closely monitored and under the care of her psychiatrist________. she verbalizes understanding.     Last Oral Swab was Performed on 03/23/2024, it was consistent.    Pain Inventory Average Pain 5 Pain Right Now 7 My pain is sharp, burning, dull, stabbing, tingling, and aching  In the last 24 hours, has pain interfered with the following? General activity 7 Relation with others 5 Enjoyment of life 9 What TIME of day is your pain at its worst? morning  and night Sleep (in general) Fair  Pain is worse with: walking, bending, and standing Pain improves with: rest and medication Relief from Meds: 5  Family History  Problem Relation Age of Onset   Diabetes Mother    Hypertension Mother    Cervical cancer Mother    Heart disease Mother    Diabetes Father    Hypertension Father    Heart disease Father    Colitis Maternal Aunt    Diabetes Other        many family members   CVA Maternal Grandmother    Lupus Neg Hx    Sarcoidosis Neg Hx    Pancreatitis Neg Hx    Ataxia Neg Hx    Chorea Neg Hx    Dementia Neg Hx    Mental retardation Neg Hx    Migraines Neg Hx    Multiple sclerosis Neg Hx    Neurofibromatosis Neg  Hx    Neuropathy Neg Hx    Parkinsonism Neg Hx    Seizures Neg Hx    Stroke Neg Hx    Colon cancer Neg Hx    Social History   Socioeconomic History   Marital status: Significant Other    Spouse name: Not on file   Number of children: 0   Years of education: Not on file   Highest education level: Not on file  Occupational History   Not on file  Tobacco Use   Smoking status: Former    Current packs/day: 0.00    Average packs/day: 0.5 packs/day for 20.0 years (10.0 ttl pk-yrs)    Types: Cigarettes    Start date: 05/22/1993    Quit date: 05/22/2013    Years since quitting: 11.0   Smokeless tobacco: Never  Vaping Use   Vaping status: Some Days   Substances: Nicotine   Substance and Sexual Activity   Alcohol  use: No    Alcohol /week: 0.0 standard drinks of alcohol    Drug use: No   Sexual activity: Never    Birth control/protection: Abstinence  Other Topics Concern   Not on file  Social History Narrative   ** Merged History Encounter **       Lives permanently in [place] with mom and stepfather.  Has not started working yet and was unemployed in [place] .         Social Drivers of Corporate investment banker Strain: Not on file  Food Insecurity: Low Risk  (02/03/2024)   Received from [facility]   Hunger Vital Sign    Within the past 12 months, you worried that your food would run out before you got money to buy more: Never true    Within the past 12 months, the food you bought just didn't last and you didn't have money to get more. : Never true  Transportation Needs: Not on file (02/03/2024)  Physical Activity: Not on file  Stress: Not on file  Social Connections: Unknown (12/23/2021)   Received [facility]   Social Network    Social Network: Not on file   Past Surgical History:  Procedure Laterality Date   ABDOMINAL SURGERY  ~ 2007   some sort of pancreatic cyst drainage.    ABDOMINAL SURGERY     ADENOIDECTOMY     CHOLECYSTECTOMY     ~ age 60-laparoscopic    CHOLECYSTECTOMY     EUS N/A 04/14/2013   Procedure: UPPER ENDOSCOPIC ULTRASOUND (EUS) LINEAR;  Surgeon: Toribio SHAUNNA Cedar, MD;  Location: [facility];  Service: Endoscopy;  Laterality: N/A;   HERNIA REPAIR  2017   ROUX-EN-Y GASTRIC BYPASS     ROUX-EN-Y PROCEDURE     stent to pancreatic cyst that became infected within 36 hours   Past Surgical History:  Procedure Laterality Date   ABDOMINAL SURGERY  ~ 2007   some sort of pancreatic cyst drainage.    ABDOMINAL SURGERY     ADENOIDECTOMY     CHOLECYSTECTOMY     ~ age 60-laparoscopic   CHOLECYSTECTOMY     EUS N/A 04/14/2013   Procedure: UPPER ENDOSCOPIC ULTRASOUND (EUS) LINEAR;  Surgeon: Toribio SHAUNNA Cedar, MD;  Location: [facility];  Service: Endoscopy;  Laterality: N/A;   HERNIA REPAIR  2017   ROUX-EN-Y GASTRIC BYPASS     ROUX-EN-Y PROCEDURE     stent to pancreatic cyst that became infected within 36 hours   Past Medical History:  Diagnosis Date   Abdominal hernia    Adrenal insufficiency 04/23/2013   ??   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  Opioid Risk Score:   Fall Risk Score:  `1  Depression screen [facility] 2/9     12/23/2023   10:54 AM 10/26/2023   11:49 AM 08/10/2023   12:54 PM 03/26/2023   10:00 AM 01/19/2023   10:22 AM 11/24/2022    9:59 AM 09/05/2022   10:47 AM  Depression screen PHQ 2/9  Decreased Interest 1 1 0 1 1 1  0  Down, Depressed, Hopeless 1 1 0 1 1 1  0  PHQ - 2 Score 2 2 0 2 2 2  0      Review of Systems  Musculoskeletal:        Bil legs and hands  All other  systems reviewed and are negative.      Objective:   Physical Exam        Assessment & Plan:  1. MS: In the process of obtaining a neurologist.  Rheumatology following  Continue to Monitor. 03/23/2024 2. 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 Refilled: Oxycodone  10 mg one tablet every 6 hours as needed. #120. Second script sent for the following month. 03/23/2024 We will continue the opioid monitoring program, this consists of regular clinic visits, examinations, urine drug screen, pill counts as well as use of [place]  Controlled Substance Reporting system. A 12 month History has been reviewed on the [place]  Controlled Substance Reporting System on 03/23/2024 3. Chronic Low Back pain without sciatica/ Low back pain with  Radiculopathy RLE/: Continued  topamax .03/23/2024  4. Chronic pancreatitis: PCP and Gastroenterologist Following. 03/23/2024   5. Insomnia: Continue Pamelor . Continue to Monitor.03/23/2024 6. PTSD: Psychiatry and Dr. Corina following. SABRA 03/23/2024. 7. Lupus: Rheumatology Following. 03/23/2024 8. Chronic Migraine/ Vascular Headaches: Continue Imitrex ,Topamax  and Aimovig .Continue to Monitor. 03/23/2024 9. Bilateral  Knee Pain: . Continue HEP as Tolerated. Continue to Monitor. 03/23/2024 10. Muscle Spasm: Continue Baclofen / Robaxin . Continue to Monitor. 03/23/2024 11. Cervicalgia: No complaints today. Continue HEP as tolerated. Continue to monitor. 03/23/2024 12. Polyarthralgia: Continue current medication regimen. Continue to monitor. 03/23/2024 13. Right Sided Weakness: Continue HEP as Tolerated. Continue to Monitor. 03/23/2024     F/U in 2 months.

## 2024-05-26 ENCOUNTER — Encounter: Attending: Registered Nurse | Admitting: Registered Nurse

## 2024-05-26 ENCOUNTER — Encounter: Payer: Self-pay | Admitting: Registered Nurse

## 2024-05-26 ENCOUNTER — Telehealth: Payer: Self-pay | Admitting: Registered Nurse

## 2024-05-26 VITALS — BP 114/79 | HR 100 | Ht 63.0 in | Wt 243.6 lb

## 2024-05-26 DIAGNOSIS — G894 Chronic pain syndrome: Secondary | ICD-10-CM | POA: Insufficient documentation

## 2024-05-26 DIAGNOSIS — M2550 Pain in unspecified joint: Secondary | ICD-10-CM | POA: Diagnosis present

## 2024-05-26 DIAGNOSIS — Z5181 Encounter for therapeutic drug level monitoring: Secondary | ICD-10-CM | POA: Diagnosis present

## 2024-05-26 DIAGNOSIS — M329 Systemic lupus erythematosus, unspecified: Secondary | ICD-10-CM | POA: Diagnosis present

## 2024-05-26 DIAGNOSIS — Z79899 Other long term (current) drug therapy: Secondary | ICD-10-CM | POA: Insufficient documentation

## 2024-05-26 DIAGNOSIS — G35D Multiple sclerosis, unspecified: Secondary | ICD-10-CM | POA: Insufficient documentation

## 2024-05-26 DIAGNOSIS — G8929 Other chronic pain: Secondary | ICD-10-CM | POA: Insufficient documentation

## 2024-05-26 DIAGNOSIS — G43719 Chronic migraine without aura, intractable, without status migrainosus: Secondary | ICD-10-CM | POA: Insufficient documentation

## 2024-05-26 DIAGNOSIS — G609 Hereditary and idiopathic neuropathy, unspecified: Secondary | ICD-10-CM | POA: Insufficient documentation

## 2024-05-26 DIAGNOSIS — M545 Low back pain, unspecified: Secondary | ICD-10-CM | POA: Diagnosis present

## 2024-05-26 MED ORDER — OXYCODONE HCL 10 MG PO TABS: 10.0000 mg | ORAL_TABLET | Freq: Four times a day (QID) | ORAL | 0 refills | Status: DC | PRN

## 2024-05-26 NOTE — Telephone Encounter (Signed)
 This pt called and stated she is at pharmacy and meds are there but they are having issues with her insurance and she thinks she needs a prior serbia

## 2024-05-27 NOTE — Telephone Encounter (Signed)
 Prior auth sent to [facility] via CoverMyMeds for oxycodone  10mg  #120

## 2024-06-07 ENCOUNTER — Other Ambulatory Visit: Payer: Self-pay | Admitting: Registered Nurse

## 2024-06-07 DIAGNOSIS — G894 Chronic pain syndrome: Secondary | ICD-10-CM

## 2024-06-08 ENCOUNTER — Other Ambulatory Visit: Payer: Self-pay | Admitting: Registered Nurse

## 2024-06-08 ENCOUNTER — Telehealth: Payer: Self-pay | Admitting: Registered Nurse

## 2024-06-08 DIAGNOSIS — G43719 Chronic migraine without aura, intractable, without status migrainosus: Secondary | ICD-10-CM

## 2024-06-08 MED ORDER — SUMATRIPTAN SUCCINATE 100 MG PO TABS: ORAL_TABLET | ORAL | 1 refills | Status: DC

## 2024-06-08 NOTE — Telephone Encounter (Signed)
 Patient called stating she is still waiting on a refill for her Nortriptyline  and Sumatriotan. She states that the pharmacy sent in a refill request.

## 2024-06-16 ENCOUNTER — Encounter: Admitting: Psychology

## 2024-06-27 ENCOUNTER — Ambulatory Visit: Admitting: Psychology

## 2024-07-11 ENCOUNTER — Other Ambulatory Visit: Payer: Self-pay

## 2024-07-13 MED ORDER — CLONAZEPAM 0.5 MG PO TABS: 0.5000 mg | ORAL_TABLET | Freq: Two times a day (BID) | ORAL | 3 refills | Status: AC | PRN

## 2024-07-13 NOTE — Telephone Encounter (Signed)
 PDMP was Reviewed.  Clonazepam  e- scribed to pharmacy.  Call placed to [facility], regarding the above. She verbalizes understanding.

## 2024-08-04 ENCOUNTER — Ambulatory Visit: Admitting: Psychology

## 2024-08-08 ENCOUNTER — Encounter: Payer: Self-pay | Admitting: Registered Nurse

## 2024-08-08 ENCOUNTER — Encounter: Attending: Registered Nurse | Admitting: Registered Nurse

## 2024-08-08 DIAGNOSIS — G43719 Chronic migraine without aura, intractable, without status migrainosus: Secondary | ICD-10-CM | POA: Insufficient documentation

## 2024-08-08 DIAGNOSIS — M329 Systemic lupus erythematosus, unspecified: Secondary | ICD-10-CM | POA: Insufficient documentation

## 2024-08-08 DIAGNOSIS — M2550 Pain in unspecified joint: Secondary | ICD-10-CM | POA: Insufficient documentation

## 2024-08-08 DIAGNOSIS — M545 Low back pain, unspecified: Secondary | ICD-10-CM | POA: Insufficient documentation

## 2024-08-08 DIAGNOSIS — G609 Hereditary and idiopathic neuropathy, unspecified: Secondary | ICD-10-CM | POA: Diagnosis not present

## 2024-08-08 DIAGNOSIS — Z79899 Other long term (current) drug therapy: Secondary | ICD-10-CM | POA: Insufficient documentation

## 2024-08-08 DIAGNOSIS — Z5181 Encounter for therapeutic drug level monitoring: Secondary | ICD-10-CM | POA: Insufficient documentation

## 2024-08-08 DIAGNOSIS — M62838 Other muscle spasm: Secondary | ICD-10-CM | POA: Insufficient documentation

## 2024-08-08 DIAGNOSIS — G35D Multiple sclerosis, unspecified: Secondary | ICD-10-CM | POA: Insufficient documentation

## 2024-08-08 DIAGNOSIS — G894 Chronic pain syndrome: Secondary | ICD-10-CM | POA: Diagnosis not present

## 2024-08-08 DIAGNOSIS — R531 Weakness: Secondary | ICD-10-CM | POA: Diagnosis not present

## 2024-08-08 DIAGNOSIS — G8929 Other chronic pain: Secondary | ICD-10-CM | POA: Insufficient documentation

## 2024-08-08 MED ORDER — QUETIAPINE FUMARATE 100 MG PO TABS: 100.0000 mg | ORAL_TABLET | Freq: Every day | ORAL | 1 refills | Status: AC

## 2024-08-08 MED ORDER — METHOCARBAMOL 500 MG PO TABS: 500.0000 mg | ORAL_TABLET | Freq: Every day | ORAL | 1 refills | Status: AC

## 2024-08-08 MED ORDER — BACLOFEN 10 MG PO TABS: ORAL_TABLET | ORAL | 2 refills | Status: AC

## 2024-08-08 MED ORDER — OXYCODONE HCL 10 MG PO TABS: 10.0000 mg | ORAL_TABLET | Freq: Four times a day (QID) | ORAL | 0 refills | Status: AC | PRN

## 2024-08-08 MED ORDER — OXYCODONE HCL 10 MG PO TABS: 10.0000 mg | ORAL_TABLET | Freq: Four times a day (QID) | ORAL | 0 refills | Status: DC | PRN

## 2024-08-08 MED ORDER — SUMATRIPTAN SUCCINATE 100 MG PO TABS: ORAL_TABLET | ORAL | 1 refills | Status: AC

## 2024-08-08 MED ORDER — TOPIRAMATE 100 MG PO TABS: ORAL_TABLET | ORAL | 2 refills | Status: AC

## 2024-08-08 MED ORDER — NORTRIPTYLINE HCL 50 MG PO CAPS: ORAL_CAPSULE | ORAL | 1 refills | Status: AC

## 2024-08-08 NOTE — Progress Notes (Signed)
 "  Subjective:    Patient ID: Mary [name], female    DOB: 1975-09-08, 48 y.o.   MRN: [id number]  HPI: Mary [name] is a 48 y.o. female who is scheduled for Phone visit, due to Transportation , I connected with Mary [name]  by telephone and verified that I am speaking with the correct person using two identifiers.  Location: Patient: In her home Provider: In the office    I discussed the limitations, risks, security and privacy concerns of performing an evaluation and management service by telephone and the availability of in person appointments. I also discussed with the patient that there may be a patient responsible charge related to this service. The patient expressed understanding and agreed to proceed.  She  states her pain is located in  her bilateral hands with tingling and burning, lower back and generalized joint pain. 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 She rates her pain 6. Her current exercise regime is walking and performing stretching exercises.  [name] Morphine equivalent is 60.00 MME.  She  is also prescribed Clonazepam  .We have discussed the black box warning of using opioids and benzodiazepines. I highlighted the dangers of using these drugs together and discussed the adverse events including respiratory suppression, overdose, cognitive impairment and importance of compliance with current regimen. We will continue to monitor and adjust as indicated.      Pain Inventory Average Pain 5 Pain Right Now 6 My pain is constant, sharp, burning, dull, stabbing, tingling, and aching  In the last 24 hours, has pain interfered with the following? General activity 6 Relation with others 6 Enjoyment of life 6 What TIME of day is your pain at its worst? morning  and night Sleep (in general) fair to Poor  Pain is worse with: bending and standing Pain improves with: rest and medication Relief from Meds: 5-6  Family History  Problem Relation Age of Onset    Diabetes Mother    Hypertension Mother    Cervical cancer Mother    Heart disease Mother    Diabetes Father    Hypertension Father    Heart disease Father    Colitis Maternal Aunt    Diabetes Other        many family members   CVA Maternal Grandmother    Lupus Neg Hx    Sarcoidosis Neg Hx    Pancreatitis Neg Hx    Ataxia Neg Hx    Chorea Neg Hx    Dementia Neg Hx    Mental retardation Neg Hx    Migraines Neg Hx    Multiple sclerosis Neg Hx    Neurofibromatosis Neg Hx    Neuropathy Neg Hx    Parkinsonism Neg Hx    Seizures Neg Hx    Stroke Neg Hx    Colon cancer Neg Hx    Social History   Socioeconomic History   Marital status: Significant Other    Spouse name: Not on file   Number of children: 0   Years of education: Not on file   Highest education level: Not on file  Occupational History   Not on file  Tobacco Use   Smoking status: Former    Current packs/day: 0.00    Average packs/day: 0.5 packs/day for 20.0 years (10.0 ttl pk-yrs)    Types: Cigarettes    Start date: 05/22/1993    Quit date: 05/22/2013    Years since quitting: 11.2   Smokeless tobacco: Never  Vaping Use   Vaping status: Some Days   Substances: Nicotine   Substance and Sexual Activity   Alcohol  use: No    Alcohol /week: 0.0 standard drinks of alcohol    Drug use: No   Sexual activity: Never    Birth control/protection: Abstinence  Other Topics Concern   Not on file  Social History Narrative   ** Merged History Encounter **       Lives permanently in [place] with mom and stepfather.  Has not started working yet and was unemployed in [place] .         Social Drivers of Health   Tobacco Use: Medium Risk (05/26/2024)   Patient History    Smoking Tobacco Use: Former    Smokeless Tobacco Use: Never    Passive Exposure: Not on file  Financial Resource Strain: Not on file  Food Insecurity: Low Risk (02/03/2024)   Received from [facility]   Epic    Within the past 12 months, you worried that  your food would run out before you got money to buy more: Never true    Within the past 12 months, the food you bought just didn't last and you didn't have money to get more. : Never true  Transportation Needs: Not on file (02/03/2024)  Physical Activity: Not on file  Stress: Not on file  Social Connections: Unknown (12/23/2021)   [facility]   Social Network    Social Network: Not on file  Depression (PHQ2-9): Low Risk (05/26/2024)   Depression (PHQ2-9)    PHQ-2 Score: 2  Alcohol  Screen: Not on file  Housing: Low Risk (02/03/2024)   Received from [facility]   Epic    What is your living situation today?: I have a steady place to live    Think about the place you live. Do you have problems with any of the following? Choose all that apply:: None/None on this list  Utilities: Low Risk (02/03/2024)   Received from [facility]   Utilities    In the past 12 months has the electric, gas, oil, or water company threatened to shut off services in your home? : No  Health Literacy: Not on file   Past Surgical History:  Procedure Laterality Date   ABDOMINAL SURGERY  ~ 2007   some sort of pancreatic cyst drainage.    ABDOMINAL SURGERY     ADENOIDECTOMY     CHOLECYSTECTOMY     ~ age 47-laparoscopic   CHOLECYSTECTOMY     EUS N/A 04/14/2013   Procedure: UPPER ENDOSCOPIC ULTRASOUND (EUS) LINEAR;  Surgeon: Toribio SHAUNNA Cedar, MD;  Location: [facility];  Service: Endoscopy;  Laterality: N/A;   HERNIA REPAIR  2017   ROUX-EN-Y GASTRIC BYPASS     ROUX-EN-Y PROCEDURE     stent to pancreatic cyst that became infected within 36 hours   Past Surgical History:  Procedure Laterality Date   ABDOMINAL SURGERY  ~ 2007   some sort of pancreatic cyst drainage.    ABDOMINAL SURGERY     ADENOIDECTOMY     CHOLECYSTECTOMY     ~ age 47-laparoscopic   CHOLECYSTECTOMY     EUS N/A 04/14/2013   Procedure: UPPER ENDOSCOPIC ULTRASOUND (EUS) LINEAR;  Surgeon: Toribio SHAUNNA Cedar, MD;  Location: [facility]  ENDOSCOPY;  Service: Endoscopy;  Laterality: N/A;   HERNIA REPAIR  2017   ROUX-EN-Y GASTRIC BYPASS     ROUX-EN-Y PROCEDURE     stent to pancreatic cyst that became infected within 36 hours  Past Medical History:  Diagnosis Date   Abdominal hernia    Adrenal insufficiency 04/23/2013   ??   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  Opioid Risk Score:   Fall Risk Score:  `1  Depression screen [facility] 2/9     05/26/2024   10:40 AM 12/23/2023   10:54 AM 10/26/2023   11:49 AM 08/10/2023   12:54 PM 03/26/2023   10:00 AM 01/19/2023   10:22 AM 11/24/2022    9:59 AM  Depression screen PHQ 2/9  Decreased Interest 1 1 1  0 1 1 1   Down, Depressed, Hopeless 1 1 1  0 1 1 1   PHQ - 2 Score 2 2 2  0 2 2 2       Review of Systems  Musculoskeletal:  Positive for myalgias.       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  Musculoskeletal:     Comments: No Physical Exam Performed: Telephone Visit           Assessment & Plan:  1. MS: In the process of obtaining a neurologist.  Rheumatology following  Continue to Monitor. 08/08/2024 2. 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 Refilled: Oxycodone  10 mg one tablet every 6 hours as needed. #120. Second script sent for the following month. 08/08/2024 We will continue the opioid monitoring program, this consists of regular clinic visits,  examinations, urine drug screen, pill counts as well as use of [place]  Controlled Substance Reporting system. A 12 month History has been reviewed on the [place]  Controlled Substance Reporting System on 08/08/2024 3. Chronic Low Back pain without sciatica/ Low back pain with  Radiculopathy RLE/: Continued  topamax .08/08/2024  4. Chronic pancreatitis: PCP and Gastroenterologist Following. 08/08/2024   5. Insomnia: Continue Pamelor . Continue to Monitor.08/08/2024 6. PTSD: Psychiatry and Dr. Corina following. . 08/08/2024. 7. Lupus: Rheumatology Following. 08/08/2024 8. Chronic Migraine/ Vascular Headaches: Continue Imitrex ,Topamax  and Aimovig .Continue to Monitor. 08/08/2024 9. Bilateral  Knee Pain: . Continue HEP as Tolerated. Continue to Monitor. 08/08/2024 10. Muscle Spasm: Continue Baclofen / Robaxin , she takes her Robaxin  during the day and Baclofen  at night only. . Continue to Monitor. 08/08/2024 11. Cervicalgia: No complaints today. Continue HEP as tolerated. Continue to monitor. 08/08/2024 12. Polyarthralgia: Continue current medication regimen. Continue to monitor. 08/08/2024 13. Right Sided Weakness: Continue HEP as Tolerated. Continue to Monitor. 08/08/2024     F/U in 2 months.  Established Patient Location of Patient; in her Home  Location of Provider: In the Office  Total Time Spent: 10 minutes            "

## 2024-09-13 ENCOUNTER — Telehealth: Payer: Self-pay | Admitting: Registered Nurse

## 2024-09-13 DIAGNOSIS — G43719 Chronic migraine without aura, intractable, without status migrainosus: Secondary | ICD-10-CM

## 2024-09-13 MED ORDER — AIMOVIG 140 MG/ML ~~LOC~~ SOAJ: SUBCUTANEOUS | 5 refills | Status: AC

## 2024-09-13 NOTE — Telephone Encounter (Signed)
 Aimovig  e-scribed to pharmacy.  Call placed to [facility], no answer, left message.

## 2024-10-04 ENCOUNTER — Encounter: Admitting: Registered Nurse
